# Patient Record
Sex: Male | Born: 1970 | Race: Black or African American | Hispanic: No | Marital: Single | Smoking: Never smoker
Health system: Southern US, Community
[De-identification: ages and names within clinical notes are randomized; demographics above are authoritative.]

## PROBLEM LIST (undated history)

## (undated) DIAGNOSIS — F329 Major depressive disorder, single episode, unspecified: Secondary | ICD-10-CM

## (undated) DIAGNOSIS — K859 Acute pancreatitis without necrosis or infection, unspecified: Secondary | ICD-10-CM

## (undated) DIAGNOSIS — D649 Anemia, unspecified: Secondary | ICD-10-CM

## (undated) DIAGNOSIS — M1990 Unspecified osteoarthritis, unspecified site: Secondary | ICD-10-CM

## (undated) DIAGNOSIS — I129 Hypertensive chronic kidney disease with stage 1 through stage 4 chronic kidney disease, or unspecified chronic kidney disease: Secondary | ICD-10-CM

## (undated) DIAGNOSIS — L02419 Cutaneous abscess of limb, unspecified: Secondary | ICD-10-CM

## (undated) DIAGNOSIS — E785 Hyperlipidemia, unspecified: Secondary | ICD-10-CM

## (undated) DIAGNOSIS — R0602 Shortness of breath: Secondary | ICD-10-CM

## (undated) DIAGNOSIS — F32A Depression, unspecified: Secondary | ICD-10-CM

## (undated) DIAGNOSIS — J189 Pneumonia, unspecified organism: Secondary | ICD-10-CM

## (undated) DIAGNOSIS — L0291 Cutaneous abscess, unspecified: Secondary | ICD-10-CM

## (undated) DIAGNOSIS — I2699 Other pulmonary embolism without acute cor pulmonale: Secondary | ICD-10-CM

## (undated) DIAGNOSIS — I10 Essential (primary) hypertension: Secondary | ICD-10-CM

## (undated) DIAGNOSIS — L309 Dermatitis, unspecified: Secondary | ICD-10-CM

## (undated) DIAGNOSIS — N189 Chronic kidney disease, unspecified: Secondary | ICD-10-CM

## (undated) DIAGNOSIS — I82409 Acute embolism and thrombosis of unspecified deep veins of unspecified lower extremity: Secondary | ICD-10-CM

## (undated) HISTORY — DX: Essential (primary) hypertension: I10

## (undated) HISTORY — DX: Cutaneous abscess, unspecified: L02.91

## (undated) HISTORY — PX: OTHER SURGICAL HISTORY: SHX169

## (undated) HISTORY — DX: Morbid (severe) obesity due to excess calories: E66.01

## (undated) HISTORY — DX: Acute pancreatitis without necrosis or infection, unspecified: K85.90

## (undated) HISTORY — DX: Other pulmonary embolism without acute cor pulmonale: I26.99

## (undated) HISTORY — DX: Hyperlipidemia, unspecified: E78.5

## (undated) HISTORY — DX: Major depressive disorder, single episode, unspecified: F32.9

## (undated) HISTORY — DX: Anemia, unspecified: D64.9

## (undated) HISTORY — DX: Depression, unspecified: F32.A

## (undated) HISTORY — DX: Unspecified osteoarthritis, unspecified site: M19.90

## (undated) HISTORY — DX: Acute embolism and thrombosis of unspecified deep veins of unspecified lower extremity: I82.409

## (undated) HISTORY — DX: Dermatitis, unspecified: L30.9

---
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
## 1997-08-12 ENCOUNTER — Emergency Department (HOSPITAL_COMMUNITY): Admission: EM | Admit: 1997-08-12 | Discharge: 1997-08-12 | Payer: Self-pay | Admitting: Emergency Medicine

## 1997-11-25 ENCOUNTER — Emergency Department (HOSPITAL_COMMUNITY): Admission: EM | Admit: 1997-11-25 | Discharge: 1997-11-25 | Payer: Self-pay | Admitting: Internal Medicine

## 1997-12-07 ENCOUNTER — Emergency Department (HOSPITAL_COMMUNITY): Admission: EM | Admit: 1997-12-07 | Discharge: 1997-12-07 | Payer: Self-pay | Admitting: Emergency Medicine

## 1997-12-15 ENCOUNTER — Encounter: Payer: Self-pay | Admitting: Emergency Medicine

## 1997-12-15 ENCOUNTER — Emergency Department (HOSPITAL_COMMUNITY): Admission: EM | Admit: 1997-12-15 | Discharge: 1997-12-16 | Payer: Self-pay | Admitting: Emergency Medicine

## 1999-02-24 ENCOUNTER — Emergency Department (HOSPITAL_COMMUNITY): Admission: EM | Admit: 1999-02-24 | Discharge: 1999-02-24 | Payer: Self-pay | Admitting: Emergency Medicine

## 1999-12-09 ENCOUNTER — Emergency Department (HOSPITAL_COMMUNITY): Admission: EM | Admit: 1999-12-09 | Discharge: 1999-12-09 | Payer: Self-pay | Admitting: Emergency Medicine

## 1999-12-21 ENCOUNTER — Encounter: Admission: RE | Admit: 1999-12-21 | Discharge: 2000-03-20 | Payer: Self-pay | Admitting: Internal Medicine

## 1999-12-22 ENCOUNTER — Emergency Department (HOSPITAL_COMMUNITY): Admission: EM | Admit: 1999-12-22 | Discharge: 1999-12-23 | Payer: Self-pay | Admitting: Emergency Medicine

## 1999-12-28 ENCOUNTER — Encounter: Payer: Self-pay | Admitting: Gastroenterology

## 1999-12-28 ENCOUNTER — Ambulatory Visit (HOSPITAL_COMMUNITY): Admission: RE | Admit: 1999-12-28 | Discharge: 1999-12-28 | Payer: Self-pay | Admitting: Gastroenterology

## 2000-07-20 ENCOUNTER — Emergency Department (HOSPITAL_COMMUNITY): Admission: EM | Admit: 2000-07-20 | Discharge: 2000-07-20 | Payer: Self-pay

## 2001-07-09 ENCOUNTER — Encounter: Payer: Self-pay | Admitting: Emergency Medicine

## 2001-07-09 ENCOUNTER — Emergency Department (HOSPITAL_COMMUNITY): Admission: EM | Admit: 2001-07-09 | Discharge: 2001-07-09 | Payer: Self-pay | Admitting: Emergency Medicine

## 2001-08-08 ENCOUNTER — Ambulatory Visit (HOSPITAL_COMMUNITY): Admission: RE | Admit: 2001-08-08 | Discharge: 2001-08-08 | Payer: Self-pay | Admitting: Internal Medicine

## 2001-09-26 ENCOUNTER — Inpatient Hospital Stay (HOSPITAL_COMMUNITY): Admission: AD | Admit: 2001-09-26 | Discharge: 2001-09-29 | Payer: Self-pay | Admitting: Internal Medicine

## 2001-09-26 ENCOUNTER — Encounter: Payer: Self-pay | Admitting: Internal Medicine

## 2001-09-28 ENCOUNTER — Encounter: Payer: Self-pay | Admitting: Gastroenterology

## 2002-04-11 ENCOUNTER — Inpatient Hospital Stay (HOSPITAL_COMMUNITY): Admission: AD | Admit: 2002-04-11 | Discharge: 2002-04-16 | Payer: Self-pay | Admitting: Internal Medicine

## 2004-05-28 ENCOUNTER — Emergency Department (HOSPITAL_COMMUNITY): Admission: EM | Admit: 2004-05-28 | Discharge: 2004-05-28 | Payer: Self-pay | Admitting: Emergency Medicine

## 2006-05-02 ENCOUNTER — Emergency Department (HOSPITAL_COMMUNITY): Admission: EM | Admit: 2006-05-02 | Discharge: 2006-05-02 | Payer: Self-pay | Admitting: Emergency Medicine

## 2006-05-06 ENCOUNTER — Inpatient Hospital Stay (HOSPITAL_COMMUNITY): Admission: AD | Admit: 2006-05-06 | Discharge: 2006-05-12 | Payer: Self-pay | Admitting: *Deleted

## 2006-05-06 ENCOUNTER — Encounter: Admission: RE | Admit: 2006-05-06 | Discharge: 2006-05-06 | Payer: Self-pay | Admitting: *Deleted

## 2007-08-05 ENCOUNTER — Emergency Department (HOSPITAL_COMMUNITY): Admission: EM | Admit: 2007-08-05 | Discharge: 2007-08-05 | Payer: Self-pay | Admitting: Emergency Medicine

## 2008-01-02 ENCOUNTER — Emergency Department (HOSPITAL_COMMUNITY): Admission: EM | Admit: 2008-01-02 | Discharge: 2008-01-02 | Payer: Self-pay | Admitting: Emergency Medicine

## 2008-03-16 ENCOUNTER — Emergency Department (HOSPITAL_COMMUNITY): Admission: EM | Admit: 2008-03-16 | Discharge: 2008-03-16 | Payer: Self-pay | Admitting: Emergency Medicine

## 2009-12-26 ENCOUNTER — Emergency Department (HOSPITAL_COMMUNITY)
Admission: EM | Admit: 2009-12-26 | Discharge: 2009-12-27 | Payer: Self-pay | Source: Home / Self Care | Admitting: Emergency Medicine

## 2010-04-07 LAB — URINALYSIS, ROUTINE W REFLEX MICROSCOPIC
Bilirubin Urine: NEGATIVE
Glucose, UA: NEGATIVE mg/dL
Ketones, ur: NEGATIVE mg/dL
Leukocytes, UA: NEGATIVE
Protein, ur: >300 mg/dL — AB
pH: 5.5 (ref 5.0–8.0)

## 2010-04-07 LAB — CBC
Hemoglobin: 10.1 g/dL — ABNORMAL LOW (ref 13.0–17.0)
MCHC: 32.7 g/dL (ref 30.0–36.0)
Platelets: 318 10*3/uL (ref 150–400)
RBC: 4.18 MIL/uL — ABNORMAL LOW (ref 4.22–5.81)

## 2010-04-07 LAB — DIFFERENTIAL
Basophils Absolute: 0.1 10*3/uL (ref 0.0–0.1)
Basophils Relative: 1 % (ref 0–1)
Eosinophils Absolute: 0.6 10*3/uL (ref 0.0–0.7)
Lymphocytes Relative: 44 % (ref 12–46)
Monocytes Absolute: 0.7 10*3/uL (ref 0.1–1.0)
Neutrophils Relative %: 35 % — ABNORMAL LOW (ref 43–77)

## 2010-04-07 LAB — POCT I-STAT, CHEM 8
BUN: 44 mg/dL — ABNORMAL HIGH (ref 6–23)
Calcium, Ion: 1.05 mmol/L — ABNORMAL LOW (ref 1.12–1.32)
Calcium, Ion: 1.1 mmol/L — ABNORMAL LOW (ref 1.12–1.32)
Chloride: 116 mEq/L — ABNORMAL HIGH (ref 96–112)
Creatinine, Ser: 3.6 mg/dL — ABNORMAL HIGH (ref 0.4–1.5)
Glucose, Bld: 168 mg/dL — ABNORMAL HIGH (ref 70–99)
HCT: 29 % — ABNORMAL LOW (ref 39.0–52.0)
HCT: 33 % — ABNORMAL LOW (ref 39.0–52.0)
Hemoglobin: 11.2 g/dL — ABNORMAL LOW (ref 13.0–17.0)
Potassium: 4.2 mEq/L (ref 3.5–5.1)
Sodium: 141 mEq/L (ref 135–145)
TCO2: 19 mmol/L (ref 0–100)

## 2010-04-07 LAB — GLUCOSE, CAPILLARY: Glucose-Capillary: 166 mg/dL — ABNORMAL HIGH (ref 70–99)

## 2010-04-07 LAB — URINE MICROSCOPIC-ADD ON

## 2010-06-12 NOTE — H&P (Signed)
NAME:  Steven Logan, Steven Logan                       ACCOUNT NO.:  [id number]   MEDICAL RECORD NO.:  [id number]                   PATIENT TYPE:  INP   LOCATION:  5731                                 FACILITY:  [facility]   PHYSICIAN:  Ravi R. Avva, M.D.                  DATE OF BIRTH:  03/23/1970   DATE OF ADMISSION:  09/26/2001  DATE OF DISCHARGE:                                HISTORY & PHYSICAL   CHIEF COMPLAINT:  No bowel movement for approximately one week, nausea and  vomiting x one day, associated with increasing malaise and  epigastric/periumbilical pain.   HISTORY OF PRESENT ILLNESS:  The patient is a 41 year old African-American  male who has multiple medical problems, including uncontrolled type 2  diabetes mellitus, dependent on large amounts of insulin and insulin  sensitizers along with morbid  obesity and a history of  hypertriglyceridemia, complicated by pancreatitis in December 2001. He has  also had increasing symptoms consistent with gastroesophageal reflux disease  during the last one year for  which he has been started on a proton pump  inhibitor. Other issues include eczema  and seasonal allergic rhinitis and a  family history significant for orderly cerebrovascular disease. The patient  has been somewhat noncompliant with timely followup with regards to his type  2 diabetes,  by his report, has been fairly diligent with trying to reduce  his caloric intake and eating a low fat diet. Despite these interventions  and a high dose of insulin consisting of approximately 1 unit/kg of insulin,  the patient consistently has had hemoglobin A1C of approximately 9/10%. The  patient states he checks his capillary blood glucose measurements  approximately twice a day prior to  meals, and they have ranged from the low  100s to the low 200s. Post prandial blood sugars have not been measured.   The patient presented to our office as an acute work in with a one  week  history of not  having  a bowel movement, now associated with a one day  history of nausea and vomiting, unable to keep any fluids down, associated  with increasing malaise and abdominal pain. The patient denies any fever or  chills or any chest pain or shortness of breath. He denies any change in his  medications. He also denies any issues of muscle spasms,change in urine  output, however, does report increasing polydipsia. The patient states that  he has been compliant with all of his medications along with a low calorie  diet during the last  several weeks. He did miss his last office visit for  reasons that are unclear.   PROBLEM LIST:  1. Type 2 diabetes, uncontrolled.  2. Eczema.  3. Family history of early cerebrovascular accident.  4. Morbid obesity.  5. Hypertriglyceridemia.  6. History of pancreatitis in December of 2001.  7. Gastroesophageal reflux disease by symptoms.  8. Seasonal allergic rhinitis  with  normal PFTs.   SOCIAL HISTORY:  The patient is single, has a high school education, works  as a Training and development officer at [employer] and has a history of using minimal alcohol and  no tobacco abuse.   FAMILY HISTORY:  Significant for a father having  died at the age of 5 of  murder, a mother having  died at the age of 73 of  type 2 diabetes, stroke  and hypertension. The patient does have grandparents who have had colon  cancer. The patient also has a brother who is approximately 33 years old who  has hypertension.   ALLERGIES:  No known drug allergies.   CURRENT MEDICATIONS:  1. Humalog insulin mixed 75/25 110 units b.i.d.  2. Nexium 40 mg b.i.d.  3. Altace 10 mg p.o. q.d.  4. TriCor 160 mg p.o. q.d.  5. Zocor 40 mg p.o. q.d.  6. Actos 45 mg p.o. q.d.  7. Norvasc 5 mg p.o. q.d.  8. Allegra 180 mg p.o. q.d.  9. Albuterol inhaler p.r.n.   PHYSICAL EXAMINATION:  GENERAL:  We have a morbidly obese African-American  male in mild malaise but talking in full sentences, responsive and  answering  questions appropriately. Oxygen saturations 96% on room air.  VITAL SIGNS:  Blood pressure 136/100, pulse 112 and regular, respirations 22  and nonlabored. The patient is afebrile.  HEENT:  Head exam normocephalic, atraumatic. Eye exam sclerae anicteric.  Extraocular movements intact. ENT exam, there are no oropharyngeal lesions,  there is  no sinus tenderness.  NECK:  Supple, no thyromegaly or cervical lymphadenopathy. Carotid arteries  are strong bilaterally without any evidence of bruits.  LUNGS:  Clear to auscultation bilaterally.  CARDIOVASCULAR:  There is a tachycardic rate that is regular.  ABDOMEN: Exam reveals a soft abdomen with hypoactive bowel sounds. No  hepatosplenomegaly was appreciated. The patient was  diffusely tender  especially in the periumbilical area and the epigastric area, but there is  no rebound present.  RECTAL:  Exam was refused by the patient despite multiple urgings for the  same.  EXTREMITIES:  Reveals no edema with pulses intact, no evidence of cyanosis.  NEUROLOGIC:  Grossly nonfocal.   ASSESSMENT AND PLAN:  We have a 40 year old African-American male who  clearly has the metabolic syndrome of a high degree of insulin resistance,  hypertension, hypertriglyceridemia, family history of early vascular  disease, and a personal history of pancreatitis, probably secondary to his  hypertriglyceridemia. His diabetes is  poorly controlled and he now presents  with significant abdominal pain in conjunction with nausea and vomiting and  hypoactive bowel sounds, worrisome for recurrent pancreatitis.   Our plan will be to check a KUB to rule out ileus and free air, keep the  patient n.p.o. and  provide IV fluids. Will check comprehensive metabolic  profile, amylase, lipase, urinalysis and CBC and assess for Ranson's  criteria for pancreatitis. The patient will probably need abdominal ultrasound and/or CT scan as well, pending the above results.  With  regards  to the patient's constipation, will provide enemas x 2, but  this is  probably secondary to poor bowel motility in the setting of the  pancreatitis along with possible dehydration in the setting of uncontrolled  type 2 diabetes mellitus and increased urine output.  Will provide a bowel  regimen based on the above results. With respect to the patient's type 2  diabetes mellitus will start NPH twice daily and add a sliding scale insulin  regimen  and treat aggressively while during hospitalization. Hospitalization  will also give [place] some idea of whether the patient has  been compliant with his high insulin regimen despite uncontrolled blood  glucose measurements and hemoglobin A1C. Hemoglobin A1C during our office  visit today was 14.3%. For the patient's gastroesophageal reflux disease,  will provide intravenous  proton pump  inhibitor an switch to orals once  able.                                                Ravi R. Dagmar Hait, M.D.    RRA/MEDQ  D:  09/26/2001  T:  09/26/2001  Job:  [id number]   cc:   Juanita Craver, M.D.  [facility] [address] [place]  [place] [address]  Fax: [phone]

## 2010-06-12 NOTE — Discharge Summary (Signed)
NAME:  Steven Logan, Steven Logan                       ACCOUNT NO.:  [id number]   MEDICAL RECORD NO.:  [id number]                   PATIENT TYPE:  INP   LOCATION:  5731                                 FACILITY:  [facility]   PHYSICIAN:  Ravi R. Dagmar Hait, M.D.                  DATE OF BIRTH:  [birth year]   DATE OF ADMISSION:  09/26/2001  DATE OF DISCHARGE:  09/29/2001                                 DISCHARGE SUMMARY   DISCHARGE DIAGNOSES:  1. Abdominal pain associated with nausea and vomiting consistent with mild     pancreatitis in the setting of hypertriglyceridemia.  2. Uncontrolled type 2 diabetes mellitus requiring high doses of insulin.  3. Metabolic syndrome in the setting of type 2 diabetes mellitus, morbid     obesity, hypertriglyceridemia, hypertension.  4. Hypertriglyceridemia.  5. Gastroesophageal reflux disease based on symptomatology.  6. Hypertension.  7. Morbid obesity.   DISCHARGE MEDICATIONS:  1. NPH insulin 100 units q.a.m. and q.p.m. with meals.  2. Nexium 40 mg q.d.  3. Altace 10 mg p.o. q.d.  4. TriCor 160 mg p.o. q.d.  5. Zocor 40 mg p.o. q.d.  6. Actos 45 mg p.o. q.d.  7. Norvasc 5 mg p.o. q.d.  8. Novolog insulin sliding scale prior to meals and q.2h. as long as glucose     is greater than 200.  Sliding scales as follows:  60-100= 0 units, 101-     150= 3 units, 151-200= 4 units, 201-250= 8 units, 251-300= 12 units, 301-     350= 16 units, greater than 350= 20 units.   DIET:  No concentrated sweets, no high fat foods.   DISCHARGE INSTRUCTIONS:  1. Record all insulin used and blood sugars on paper.  2. Bring all papers to Dr. Danna Hefty office.  3. Call Manuela Schwartz, Dr. Danna Hefty nurse, for an appointment in two to three weeks.  4. Call with any nausea, vomiting, or abdominal pain.  5. Follow up with nutrition and diabetes management center for dietary     consultation and evaluation of current eating habits.   HISTORY OF PRESENT ILLNESS:  The patient is a 40 year old  African-American  male whose past medical history and current problems are well documented in  the patient's history and physical dictated on the day of admission.  Briefly, this is a 40 year old African-American male who has significant  metabolic syndrome as mentioned above.  Compliance has been a factor,  however, per patient report he has been taking all of his medications on a  regular basis, however, dietary noncompliance continues to be an issue.  The  patient's hemoglobin A1C consistently runs 9 to 10%.   On the day of admission, the patient presented with a one week history of  progressive nausea, vomiting, and abdominal pain, in conjunction with  constipation.  The patient does have a history of pancreatitis thought to be  secondary to hypertriglyceridemia.  The  patient was admitted for further  evaluation and management of a presumed pancreatitis.   HOSPITAL COURSE:  The patient was seen and evaluated by Dr. Juanita Craver of  gastroenterology.  The patient was continued on intravenous fluids,  intravenous proton pump inhibitor.  KUB was obtained to rule out ileus and  perforation with free air.  This was unremarkable.  Amylase and lipase as  documented below revealed a mildly elevated lipase with normal liver  function tests.  White blood cell count was slightly elevated.  Within 24  hours the patient felt mildly better, and he was started on clear fluids  which he tolerated.  Abdominal ultrasound was reportedly unremarkable per  Dr. Collene Mares.  After evaluation of the patient's past medical records and  physical examination, she agreed with intravenous Protonix, ultrasound, and  aggressive treatment of the patient's hyperlipidemia.  She did not think  that the patient needed an esophagogastroduodenoscopy at this point,  however, may need a HIDA scan before an esophagogastroduodenoscopy if the  abdominal pain persists.  She further agreed with aggressive dietary  management in  addition to getting his blood sugars under control.   With respect to the patient's type 2 diabetes, the patient was n.p.o. with  the exception of clear fluids.  On a regimen of NPH 100 units b.i.d., and  sliding scale insulin using the insulin resistance scale, the patient's  blood sugars ranged from 122 to 229.  The patient was seen by nutrition who  also discussed with the patient his dietary habits.  He stated that he knows  his diabetes and fat in his diet needs to be under better control, and he  basically knows what to do, and he was ready to make changes.  He did  request written information from the dietitian which was readily provided.  Dietitian further suggested to concentrate on his diabetic control first and  then manage his lipids if they are still uncontrolled.  She further  recommended followup at the nutrition and diabetes management center.  Brief  mention was mentioned to the patient the possibility of a gastric bypass  operation given his morbid obesity and significant metabolic syndrome.  This  will be followed up on an outpatient basis.  Again, strict control of the  patient's diet along with close monitoring of his blood sugars was enforced  with the patient, and he will follow up with myself in two to three weeks.   With respect to the patient's gastroesophageal reflux disease and  hypertension, these were well controlled on his current regimen as mentioned  above.   LABORATORY DATA:  Laboratories for day of discharge are still pending.  However, prior to discharge the patient's sodium was 137, potassium 3.5,  chloride 105, carbon dioxide 22, glucose 177, BUN 11, creatinine 0.9,  calcium 8.6, total protein 6.6, albumin 2.7, AST 11, ALT 18, alkaline  phosphatase 75, total bilirubin 0.7.  White blood cell count 6.8, hemoglobin  14.0, hematocrit 35.5%, MCV 82, platelet count 230.  TSH has been normal on an outpatient basis.  Urinalysis is unremarkable.  Total  cholesterol was  672, triglycerides were 3011, HDL was 23.     Ravi R. Dagmar Hait, M.D.                        Ravi R. Dagmar Hait, M.D.    RRA/MEDQ  D:  09/29/2001  T:  10/01/2001  Job:  [id number]   cc:   Juanita Craver,  M.D.  [address] [place]  [place] [address]  Fax: [phone]

## 2010-06-12 NOTE — H&P (Signed)
NAME:  Steven Logan, Steven Logan NO.:  [id number]   MEDICAL RECORD NO.:  [id number]          PATIENT TYPE:  INP   LOCATION:  3705                         FACILITY:  [facility]   PHYSICIAN:  Kaylyn Lim., M.D.DATE OF BIRTH:  1971-01-08   DATE OF ADMISSION:  05/06/2006  DATE OF DISCHARGE:                              HISTORY & PHYSICAL   INDICATION FOR ADMISSION:  Pulmonary embolus.   PRIMARY CARE PHYSICIAN:  Dr. Berneta Sages, [facility].   HISTORY OF PRESENT ILLNESS:  Steven Logan is a very pleasant 40 year old  African American male, past medical history of diabetes mellitus,  dyslipidemia, gastroesophageal reflux disease, obesity who presented for  evaluation of shortness of breath.  The patient has had shortness of  breath now for the last 5 days.  He actually had an ER evaluation on  April7, 2008, after a syncopal spell.  At that time, he reported he was  having dyspnea both at rest and with exertion.  He states that he was  leaning over and he passed out.  This happened twice.  He came in for  evaluation, had routine blood work checked which all was normal.  His O2  sats were normal.  His EKG showed no significant ST or T-wave changes.  He was told to follow up with his primary doctor.  Because his symptoms  continued, he then came for a new patient consult.  He was seen  yesterday for evaluation of his shortness of breath.  At that time he  did state he passed out twice, both on Monday.  When he did lose  consciousness, he said he urinated on himself.  It feels like he just  could get his breath.  He denies any palpitations.  No presyncope at  current time.  He had a chest x-ray done which checked out okay.  At  his initial evaluation yesterday, he was found to be volume overloaded,  started on Lasix, also had an echocardiogram which showed marked RV  enlargement.  He then was sent for a D-dimer which was elevated and a CT  scan which showed bilateral  pulmonary emboli.  He will now be admitted  for anticoagulation and treatment.   REVIEW OF SYSTEMS:  Are as per HPI.   CURRENT MEDICATIONS:  1. Lantus 75 units twice daily.  2. Benicar 20 mg daily.  3. Lasix 20 mg daily (just started).   ALLERGIES:  NONE.   FAMILY HISTORY:  Positive for stroke, hypertension and diabetes.  No  early heart disease or sudden cardiac death noted.   SOCIAL HISTORY:  He is single.  He works as a Training and development officer.  He does not  exercise.  No tobacco.  Drinks one to two beers per day.  No significant  caffeine intake.   PAST SURGICAL HISTORY:  None.   PHYSICAL EXAMINATION:  VITAL SIGNS:  His weight is 278 pounds.  His  blood pressure is 130/90.  His heart rate is 102 and regular.  GENERAL:  He is a very pleasant, young, African American male, alert and  oriented x4.  No acute distress.  NECK:  He does have mild JVD.  LUNGS:  Clear.  HEART:  Regular with S4 noted.  ABDOMEN:  Soft, nontender and nontender, nondistended.  No rebound or  guarding.  EXTREMITIES:  Warm with trace pedal and pretibial edema.  NEUROLOGIC:  Cranial nerves are intact with no focal deficits.   His echo was reviewed and showed normal LV size with mild LV dysfunction  and EF of 40-45%.  He had marked LVH with LV thickness of 19-mm.  He had  marked RV enlargement with mildly reduced function and moderate  pulmonary hypertension with PA systolic pressure estimated at 54-mmHg.  He had mild diastolic dysfunction and mild tricuspid valve  regurgitation.  His most recent blood work showed a BUN and creatinine  of 13 and 0.9, potassium level of 3.6.  He had a D-dimer of 4.7, and a  BNP level of 115.  His EKG showed sinus tachycardia, rate of 102 per  minute, normal axis, normal intervals with no significant ST or T-wave  changes noted.  The patient was sent for a CT which showed bilateral  pulmonary emboli with a saddle embolus also noted.   IMPRESSION:  1. Pulmonary embolism.  2. Dyspnea  secondary to number one.  3. Diabetes mellitus.  4. History of hypertension.  5. History of mild left ventricular dysfunction.  6. Morbid obesity.   PLAN:  1. At the current time the patient will be admitted to the hospital      for treatment of his pulmonary embolism.  2. We will start heparin drip.  He will continue his heparin for 24      hours prior to starting his Coumadin.  3. We will also check a hypercoagulable workup because of his      disorder.  4. I discussed the results of his CT and echo with him at length.      While he is in the hospital, he will also check for infiltrative      heart disease with TSH, ferritin levels, SPEP, UPEP, etc.      Kaylyn Lim., M.D.  Electronically Signed     TWK/MEDQ  D:  05/06/2006  T:  05/06/2006  Job:  [id number]   cc:   Berneta Sages, M.D.

## 2010-06-12 NOTE — H&P (Signed)
NAME:  Steven Logan, Steven Logan                       ACCOUNT NO.:  [id number]   MEDICAL RECORD NO.:  [id number]                   PATIENT TYPE:  INP   LOCATION:  5031                                 FACILITY:  [facility]   PHYSICIAN:  Berneta Sages, M.D.                     DATE OF BIRTH:  16-Feb-1970   DATE OF ADMISSION:  04/11/2002  DATE OF DISCHARGE:                                HISTORY & PHYSICAL   CHIEF COMPLAINT:  Hypertriglyceridemia, severe with risk of pancreatitis and  hyperglycemia, uncontrolled despite excessive amounts of insulin use.   HISTORY OF PRESENT ILLNESS:  This is a 40 year old African American male who  is morbidly obese, complicated by stressors at work. He has a history of  type 2 diabetes mellitus uncontrolled, using large amounts of insulin,  hypertriglyceridemia with life-threatening levels secondary to risk of  pancreatitis, hypertension, questionable asthma with normal pulmonary  function tests. He has a history of taking Tricor and Zocor therapy for his  hyperlipidemia for which he and family members state that he has been  compliant with. He also  maintains high-dose insulin consisting of 100 units  of NPH twice daily along with a sliding scale regimen using NovoLog. Despite  these interventions, yet complicated by noncompliance with dietary intake  given stressors, his hemoglobin A1c continues to be  high, usually residing  greater than 10%. His triglyceride levels have been as low as 500, but  recently have been as high as 11,000, and indeed he has been hospitalized  for pancreatitis in the past. The patient again states that his compliance  with his insulin and anti-hyperlipidemic therapy has been good. Again  dietary control may have been poor secondary to stressors. The patient was  initiated on Lexapro in February 2004. This was in the hope of alleviating  his stressors to allow better control of his type 2 diabetes mellitus and  hypertriglyceridemia.   Given the fact that his triglycerides have been as high as 11,000 and his  hemoglobin A1c has been unmeasurable secondary to his hypertriglyceridemia,  extensive discussion was undertaken between myself, the patient and other  consulting physicians. Given his high risk of pancreatitis, glucose toxicity  and hypertriglyceridemia, it was thought it would be best to admit the  patient for assessment of his pancreatic enzymes, control of his type 2  diabetes mellitus by using a Glucomander at first with a clear fluid diet  with the hopes of transitioning the patient to a normal diet with proper  glucose control prior to discharge.  Furthermore it was thought that by  reducing the patient's glucose toxicity baseline lipid panel measurements  may be obtained with the hope of obtaining proper control using medications  and dietary therapy. Given the patient's significant hypertriglyceridemia,  it was also thought that the patient may benefit from gastric bypass  surgery, given his morbid obesity and his life-threatening levels of  glucose  and triglycerides.   REVIEW OF SYSTEMS:  Negative for fevers, chills, visual abnormalities,  dysphagia, chest pain, shortness of breath, nausea, vomiting, change in  bowel habits or focal neurological deficits. Positive for diffuse weakness  and possible numbness in the hands.   PROBLEM LIST:  1. Type 2 diabetes mellitus, uncontrolled for numerous years.  2. Eczema.  3. Family history of early cerebrovascular accident.  4. Morbid obesity.  5. Hypertriglyceridemia with levels as high as 11,000.  6. History of pancreatitis, treated with bowel rest and intravenous fluid     resuscitation.  7. Gastroesophageal reflux disease.  8. Seasonal allergic rhinitis.   SOCIAL HISTORY:  The patient is single. He is a Training and development officer at [employer]. He  has a  high school education. He has minimal use of alcohol and has no use  of tobacco products.   FAMILY HISTORY:   Significant for  his mother having  died at the age of 8  of type 2 diabetes mellitus, stroke and hypertension. Father having  died at  the age of 27 of murder. Grandparents, history is significant for colon  cancer. A 55 year old brother is significant for having  hypertension.   CURRENT MEDICATIONS:  1. Nexium 40 mg b.i.d.  2. Tricor 160 mg p.o. every day.  3. Zocor 40 mg p.o. every day.  4. Actos 45 mg p.o. every day.  5. Benicar 40/25 mg 1 p.o. every day.  6. Albuterol metered-dose inhaler p.r.n.  7. Advair 50/100 1 puff b.i.d.  8. NPH insulin 100 units b.i.d.  9. NovoLog sliding scale regimen.  10.      Vicodin p.r.n.  11.      Senokot p.r.n.   PHYSICAL EXAMINATION:  GENERAL:  He is a pleasant, morbidly obese African  American male in no apparent distress lying in bed. Weight is 320 pounds.  VITAL SIGNS: Blood pressure 130/49, pulse 97, respirations 20 and unlabored,  temperature 97.2 degrees  Fahrenheit.  HEENT:  Sclerae anicteric. Extraocular movements intact. No oropharyngeal  lesions. No sinus tenderness.  NECK:  Supple. No thyromegaly, no carotid bruits. No cervical or axillary  lymphadenopathy.  LUNGS:  Clear to auscultation bilaterally.  CARDIOVASCULAR:  Regular rate and rhythm but distant heart sounds.  ABDOMEN:  Soft, nontender, nondistended abdomen with bowel sounds present  throughout. There is no hepatosplenomegaly.  EXTREMITIES:  No edema. Pulses are 2+ all 4 extremities.  NEUROLOGIC:  Grossly nonfocal including to light touch.   LABORATORY DATA:  Sodium 129, potassium 4.1, chloride 94, serum CO2 26,  glucose 495, creatinine 1.1, BUN 17. Alkaline phosphatase 76, total  bilirubin 0.7, SGOT 17, SGPT 23, albumin 3.6, calcium 9.8, amylase 60,  lipase 22. CBC, TSH and free T4 pending. Labs for the morning that are  pending include lipid panel, hemoglobin A1c, and C-peptide.   ASSESSMENT AND PLAN: 1. Uncontrolled type 2 diabetes mellitus, currently on Actos  therapy along     with high-dose insulin therapy in excess of 1 unit per kg. Will initiate     an insulin drip with Glucomander and have the patient start a clear fluid     diet and advance once CBGs are controlled with the idea of obtaining     optimal control of his diabetes on a regular no  concentrated sweet diet.     Our  plan will be to decrease his glucose toxicity so that his pancreatic     activity may improve with insulin secretion and adjust his  diabetic     regimen for outpatient use. Will also obtain a C-peptide and check a     hemoglobin A1c since this was not measurable on an outpatient basis.     Other priorities will include compliance. We are working on the patient's     compliance with dietary intake.  2. Hypertriglyceridemia. Life-threatening given issues with risks of     pancreatitis. Again will reduce glucose toxicity, recheck lipid panel,     pancreatic enzymes appear normal, and will check thyroid function tests.     However, these have been normal within the last year on an outpatient     basis. Will recheck lipid  panel once control of type 2 diabetes mellitus     has been achieved and maintain the patient on Tricor and Zocor to see if     control of his triglycerides can be maintained.  3. Morbid obesity. Discussed at length with the patient and family. Given     the patient's severe hypertriglyceridemia and hyperglycemia, one may     consider gastric bypass surgery as an alternative for this patient's     future and in doing so will consult Dr. Johnathan Hausen for     recommendations concerning the same.                                               Berneta Sages, M.D.    RA/MEDQ  D:  04/11/2002  T:  04/11/2002  Job:  [id number]   cc:   Isabel Caprice Hassell Done, M.D.  [id number] [address], [address]  [place] [address]  Fax: [phone]

## 2010-06-12 NOTE — Discharge Summary (Signed)
NAME:  Steven Logan, Steven Logan                       ACCOUNT NO.:  [id number]   MEDICAL RECORD NO.:  [id number]                   PATIENT TYPE:  INP   LOCATION:  [id number]                                 FACILITY:  [facility]   PHYSICIAN:  Berneta Sages, M.D.                     DATE OF BIRTH:  11-Aug-1970   DATE OF ADMISSION:  04/11/2002  DATE OF DISCHARGE:  04/16/2002                                 DISCHARGE SUMMARY   DISCHARGE DIAGNOSES:  1. Severe hypertriglyceridemia with triglyceride levels in excess of 10,000.  2. Severe insulin resistance with uncontrolled type 2 diabetes mellitus.  3. Morbid obesity.  4. Depression.  5. Gastroesophageal reflux disease.  6. History of pancreatitis.  7. Hypertension.  8. Asthma.  9. Seasonal allergic rhinitis.   DISCHARGE MEDICATIONS:  1. Lantus insulin 200 units subcutaneously b.i.d.  Of the 200 units, 100     units are to be administered at one of two different sites.  2. Novolog sliding scale prior to meals: 101 to 150, 14 units; 151 to 200,     22 units; 201 to 250, 28 units; 251 to 300, 36 units; 301 to 350, 45     units; greater than 350, 50 units.  3. Benicar 40 mg each day.  4. Actos 45 mg each day.  5. TriCor 160 mg each day.  6. Lipitor 20 mg each day.  7. Nexium 40 mg each day.  8. Advair 1 puff b.i.d.  9. Lexapro 10 mg each day.   DIET:  No fat, no concentrated sweets.  The patient was instructed to check  his CBGs four times a day.   FOLLOW UP:  He is to follow up with me in two weeks and is to bring his CBG  log.  He is also to follow up with the nutrition [facility] diabetes management  center.   DISCHARGE LABORATORY EVALUATION:  White blood cell count 4.1, hemoglobin  11.0, hematocrit 30%, platelet count 256.  Sodium 129 on admission; at time  of discharge, 140.  Potassium on discharge 3.6, BUN 11 on discharge,  creatinine 0.9.  On admission, BUN was 17.  Glucose on admission was 495;  glucose on discharge was 78.  AST 17, ALT 23,  alkaline phosphatase 76, total  bilirubin 0.7, amylase 60, lipase 22.  Hemoglobin A1C 14.4%.  Total  cholesterol 887, triglycerides 12,420.  Of note, the previous lipid profile  was on 04/12/2002.  Repeat lipid profile within on day after being on a  nuclein, total cholesterol 932, triglycerides down to 8650.  TSH 1.645, free  T4 1.47, C peptide 0.8.   HISTORY OF PRESENT ILLNESS:  This is a 40 year old African-American male who  is morbidly obese, complicated by stressors at work, but with a history of  type 2 diabetes mellitus, insulin resistance, and life-threatening  hypertriglyceridemia, with a history of pancreatitis and hypertension.  He  has failed outpatient therapy using moderate doses of insulin, anti-  lipidemics, antihypertensive agents, and antidepressant type medications.  Please see my History and Physical for extensive details with regard to the  patient's presenting illness and recent history.  The patient was admitted  for intravenous insulin to control his uncontrolled type 2 diabetes mellitus  and with the hope of decreasing his hypertriglyceridemia which were at life-  threatening levels due to risk of pancreatitis.   HOSPITAL COURSE:  The patient was admitted, placed on a Glucommander which  quickly controlled his blood sugars over 24-hour period of time on hospital-  controlled diet. He was quickly transferred to Lantus insulin 120 units  q.6h.; however, his glucose control quickly decompensated, and his Lantus  insulin was quickly escalated with a sliding scale regimen of Novolog that  was escalated as well.  During this process, the patient's visual complaints  of fatigue and dry mouth quickly resolved.  The staff as well as myself and  colleagues had a difficult time getting the patient to understand the nature  of his disease process and the need for multiple site injections for his  Lantus insulin given the large volumes.   The patient was initiated on TriCor  and Lipitor, given his  hypertriglyceridemia, and his high-dose Zocor was discontinued.  As stated  above, his triglyceride levels were extremely high with outpatient followup  planned.   Blood pressure remained stable.  Diabetes coordinator had a lengthy  discussion with the patient and also commented on the patient's need for  behavioral therapy.  Also mentioned was the fact that the patient was not  checking his CBGs on a regular basis at home as ordered by myself.   [name] sugars, diet, and blood pressure were well controlled by the  day of discharge, 04/16/2002, and it was felt the patient was appropriate for  further outpatient management and workup with strict instructions for  followup in my office.                                               Berneta Sages, M.D.    RA/MEDQ  D:  07/17/2002  T:  07/18/2002  Job:  [id number]

## 2010-06-12 NOTE — Discharge Summary (Signed)
NAME:  Steven Logan, Steven Logan NO.:  [id number]   MEDICAL RECORD NO.:  [id number]          PATIENT TYPE:  INP   LOCATION:  3705                         FACILITY:  [facility]   PHYSICIAN:  Kaylyn Lim., M.D.DATE OF BIRTH:  March 22, 1970   DATE OF ADMISSION:  05/06/2006  DATE OF DISCHARGE:  05/12/2006                               DISCHARGE SUMMARY   DISCHARGE DIAGNOSES:  1. Bilateral pulmonary emboli.  2. Diabetes mellitus.  3. Great toe abscess.  4. Hypertension.  5. Dyslipidemia.  6. Obesity.   HISTORY OF PRESENT ILLNESS:  Steven Logan is a 40 year old gentleman who  presented after two syncopal spells and increasing dyspnea.  He was  found to have elevated D-dimer and increased right-sided heart  pressures.  He was sent for CT scan which showed bilateral PEs with near  total occlusive disease on the right and mild occlusive disease on the  left.  He was admitted for anticoagulation with possibility of  thrombolytics if needed.   HOSPITAL COURSE:  [name] hospital course was uncomplicated.  He  was started on heparin drip immediately on arrival to the hospital.  He  continued on heparin for five days, and Coumadin was started after he  had been therapeutic on heparin for 24 hours.  His INR increased slowly.  After five days of total heparinization he was switched to Lovenox shots  125 mg twice daily.  His breathing improved significantly over his time  here in the hospital.  He has now been up and ambulatory with no  significant problems.  His INR on day of discharge is 1.6.  He will be  discharged home with Lovenox bridge until his INR is therapeutic.  He  never became hypotensive nor had any hemodynamic compromise during his  stay.   DISCHARGE MEDICATIONS:  1. Lantus insulin 65 units twice daily.  2. Sliding scale insulin.  3. Keflex 500 mg four times daily for seven days.  4. Bactroban ointment.  5. Benicar 20 mg daily.  6. Coumadin 15 mg daily (10 mg  tablet 1-1/2 tabs daily).  7. Robitussin DM over-the-counter p.r.n. for cough.  8. Aspirin 325 mg daily.  9. Lovenox 120 mg subcu b.i.d. for four days.  The patient was given      his a.m. dose here in the hospital.  He will take his next dose at      home this evening, then two doses on Friday, and his last dose on      Saturday.   He will follow up with [facility] with Oretha Ellis on May 16, 2006 for INR check.  He will follow up with Dr.  Verlon Setting at [facility] in two weeks for post hospital visit.  Did discuss the need for dietary restrictions.  If he continues to feel  back to normal as he increases activities, he may return to his work  activities on May 14, 2006.      Kaylyn Lim., M.D.  Electronically Signed     TWK/MEDQ  D:  05/12/2006  T:  05/12/2006  Job:  [id number]   cc:   Berneta Sages, M.D.  [employer]

## 2010-09-09 ENCOUNTER — Encounter: Payer: Self-pay | Admitting: Internal Medicine

## 2010-09-22 ENCOUNTER — Encounter: Payer: Self-pay | Admitting: Internal Medicine

## 2010-09-22 ENCOUNTER — Ambulatory Visit (INDEPENDENT_AMBULATORY_CARE_PROVIDER_SITE_OTHER): Payer: BC Managed Care – PPO | Admitting: Internal Medicine

## 2010-09-22 VITALS — BP 132/82 | HR 78 | Ht 73.0 in | Wt 278.0 lb

## 2010-09-22 DIAGNOSIS — N189 Chronic kidney disease, unspecified: Secondary | ICD-10-CM | POA: Insufficient documentation

## 2010-09-22 DIAGNOSIS — M109 Gout, unspecified: Secondary | ICD-10-CM | POA: Insufficient documentation

## 2010-09-22 DIAGNOSIS — Z7901 Long term (current) use of anticoagulants: Secondary | ICD-10-CM

## 2010-09-22 DIAGNOSIS — I2699 Other pulmonary embolism without acute cor pulmonale: Secondary | ICD-10-CM | POA: Insufficient documentation

## 2010-09-22 DIAGNOSIS — E785 Hyperlipidemia, unspecified: Secondary | ICD-10-CM | POA: Insufficient documentation

## 2010-09-22 DIAGNOSIS — I10 Essential (primary) hypertension: Secondary | ICD-10-CM | POA: Insufficient documentation

## 2010-09-22 DIAGNOSIS — D509 Iron deficiency anemia, unspecified: Secondary | ICD-10-CM | POA: Insufficient documentation

## 2010-09-22 MED ORDER — PEG-KCL-NACL-NASULF-NA ASC-C 100 G PO SOLR: 1.0000 | ORAL | Status: DC

## 2010-09-22 NOTE — Patient Instructions (Signed)
You have been scheduled for an Endo Colon. Please stop by the lab today in our basement for labs. We are sending a letter regarding holding your warfarin before your procedure if you do not here from [place] in 1 week please call [phone] and ask for Patty. Your prep has been sent to your pharmacy.

## 2010-09-22 NOTE — Progress Notes (Signed)
Subjective:    Patient ID: [name], male    DOB: March 17, 1970, 40 y.o.   MRN: [id number]  HPI Mrs. Mechele Claude is a 40 year old male with a past medical history of hypertension, diabetes, PE on chronic warfarin who is referred by Dr. Dagmar Hait for evaluation of iron deficiency anemia.  The patient present alone today. Patient is aware of his anemia but is unsure how long this has been an issue. He denies obvious blood loss. He seen no blood in his stool and he denies melena. No hematemesis. No hematuria.  Patient denies abdominal pain. He denies nausea/vomiting, the 1 day last week he did have nausea and vomiting but this resolved. He denies heartburn. No odynophagia or dysphagia. He reports his bowel movements are regular and once per day. He reports these as formed and brown.  No fever or chills.  He reports his weight has been stable of late, and reports his appetite is good. He does note intense thirst for the past 2 weeks.  He does report having previously had a sigmoidoscopy greater than 10 years ago. He is unsure exactly what this was for but remembers it to have been normal.  Review of Systems Constitutional: Negative for fever, chills, night sweats, activity change, appetite change and unexpected weight change HEENT: Negative for sore throat, mouth sores and trouble swallowing. Eyes: Occasional blurry vision. Respiratory: Negative for cough, chest tightness and shortness of breath Cardiovascular: Negative for chest pain, palpitations and lower extremity swelling Gastrointestinal: See history of present illness Genitourinary: Negative for dysuria and hematuria. Musculoskeletal: Negative for back pain, + occasional muscle cramping. Skin: Negative for rash or color change Neurological: Occasional headaches, no weakness, numbness Hematological: Negative for adenopathy, negative for easy bruising/bleeding Psychiatric/behavioral: Negative for depressed mood, negative for anxiety, + difficulty  with sleeping.  PMH: HTN Obesity Gout Eczema History of bilateral pulmonary emboli Hyperlipidemia  Acute renal insufficiency with hospitalization 2011 History pancreatitis felt secondary to hypertriglyceridemia  Current outpatient prescriptions:amLODipine-olmesartan (AZOR) 5-40 MG per tablet, Take 1 tablet by mouth daily.  , Disp: , Rfl: ;  insulin aspart protamine-insulin aspart (NOVOLOG 70/30) (70-30) 100 UNIT/ML injection, Inject into the skin 2 (two) times daily with a meal. 50 units twice a day , Disp: , Rfl: ;  Nebivolol HCl (BYSTOLIC) 20 MG TABS, Take by mouth.  , Disp: , Rfl:  predniSONE (DELTASONE) 20 MG tablet, Take 20 mg by mouth daily. As needed , Disp: , Rfl: ;  warfarin (COUMADIN) 10 MG tablet, Take 10 mg by mouth. Four times a week , Disp: , Rfl: ;  peg 3350 powder (MOVIPREP) 100 G SOLR, Take 1 kit (100 g total) by mouth as directed. See written handout, Disp: 1 kit, Rfl: 0  All: NKDA  Social History  . Marital Status: Single, no kids   Social History Main Topics  . Smoking status: Never Smoker   . Smokeless tobacco: Never Used  . Alcohol Use: None  . Drug Use: No   Family History  Problem Relation Age of Onset  . Stroke Mother   . Diabetes Mother   . Hypertension Mother   . Cancer    . Coronary artery disease    . Hypertension Brother   -notes Mat GM - colon polyps (? Age), Pat GF - lung cancer    Objective:   Physical Exam BP 132/82  Pulse 78  Ht 6\' 1"  (1.854 m)  Wt 278 lb (126.1 kg)  BMI 36.68 kg/m2 Constitutional: Well-developed and well-nourished. No  distress. HEENT: Normocephalic and atraumatic. Oropharynx is clear and moist. No oropharyngeal exudate. Conjunctivae are normal. Pupils are equal round and reactive to light. No scleral icterus. Neck: Neck supple. Trachea midline. Cardiovascular: Normal rate, regular rhythm and intact distal pulses. No M/R/G Pulmonary/chest: Effort normal and breath sounds normal. No wheezing, rales or  rhonchi. Abdominal: Soft, nontender, nondistended. There are no masses palpable. No hepatosplenomegaly. Lymphadenopathy: No cervical adenopathy noted. Neurological: Alert and oriented to person place and time. Skin: Skin is warm and dry. No rashes noted. Psychiatric: Normal mood and affect. Behavior is normal.  Labs 08/31/2010: Iron 20, % sat 8, TIBC 251 WBC 5.5 hemoglobin 8.5 hematocrit 25.7 platelet 328, MCV 75.9 Sodium 141 potassium 5.4 chloride 114 CO2 20 BUN 36 creatinine 2.0 Calcium 8.2 total protein 7.1 albumin 2.9 AST 15 ALT 14 alkaline phosphatase 82 total bili 0.2 Uric acid 11.5    Assessment & Plan:  40 year old male with a past medical history of hypertension, diabetes, PE on chronic warfarin who is referred by Dr. Dagmar Hait for evaluation of unexplained iron deficiency anemia  1. Iron def anemia -- certainly occult GI blood loss is near the top of the differential in this 40 year-old male.  His anemia is significant. Celiac disease is very unlikely given his race however we will check a celiac panel today. I will schedule him for EGD and colonoscopy for further evaluation of iron deficiency anemia. I will also check a TSH today and a ferritin.  He does have chronic kidney disease, but is unclear to me if to the level where one would expect anemia. We will proceed with GI workup for evaluation of his anemia.  We discussed potential capsule endoscopy his upper endoscopy and colonoscopy are negative. He is willing to proceed.  He is on warfarin and therefore we will contact his PCP to discuss the need for Lovenox bridge given his history of pulmonary emboli.  This will be left to the discretion of his PCP.  2. Excessive thirst -- this could be related to his known diabetes, and we will check a chemistry panel today. I've asked that he discuss this with his primary care physician if it does not improve.

## 2010-09-23 ENCOUNTER — Encounter: Payer: Self-pay | Admitting: Internal Medicine

## 2010-09-24 ENCOUNTER — Other Ambulatory Visit (INDEPENDENT_AMBULATORY_CARE_PROVIDER_SITE_OTHER): Payer: BC Managed Care – PPO

## 2010-09-24 DIAGNOSIS — D509 Iron deficiency anemia, unspecified: Secondary | ICD-10-CM

## 2010-09-24 LAB — CBC WITH DIFFERENTIAL/PLATELET
Eosinophils Absolute: 0.2 10*3/uL (ref 0.0–0.7)
Eosinophils Relative: 2.7 % (ref 0.0–5.0)
Lymphocytes Relative: 31.5 % (ref 12.0–46.0)
MCHC: 32.1 g/dL (ref 30.0–36.0)
MCV: 75.9 fl — ABNORMAL LOW (ref 78.0–100.0)
Monocytes Absolute: 0.4 10*3/uL (ref 0.1–1.0)
Neutrophils Relative %: 58.3 % (ref 43.0–77.0)
Platelets: 318 10*3/uL (ref 150.0–400.0)
RBC: 3.33 Mil/uL — ABNORMAL LOW (ref 4.22–5.81)
WBC: 6.4 10*3/uL (ref 4.5–10.5)

## 2010-09-24 LAB — BASIC METABOLIC PANEL
Calcium: 8 mg/dL — ABNORMAL LOW (ref 8.4–10.5)
GFR: 31.42 mL/min — ABNORMAL LOW (ref >60.00)
Potassium: 4.9 mEq/L (ref 3.5–5.1)
Sodium: 143 mEq/L (ref 135–145)

## 2010-09-24 LAB — FERRITIN: Ferritin: 121.5 ng/mL (ref 22.0–322.0)

## 2010-09-24 LAB — TSH: TSH: 3.44 u[IU]/mL (ref 0.35–5.50)

## 2010-09-25 ENCOUNTER — Telehealth: Payer: Self-pay | Admitting: *Deleted

## 2010-09-25 LAB — IGG: IgG (Immunoglobin G), Serum: 1640 mg/dL — ABNORMAL HIGH (ref 650–1600)

## 2010-09-25 NOTE — Telephone Encounter (Signed)
Informed pt that Dr Hilarie Fredrickson stated his lab results continue to show anemia. Anemia may be a mixed problem d/t the Ferritin being normal and IRON and % sat being low. He recommends continuing with ECL on 11/05/10. His Thyroid is normal and the kidney function remains abnormal, but stable. Pt stated understanding.

## 2010-09-25 NOTE — Telephone Encounter (Signed)
Message copied by Lance Morin on Fri Sep 25, 2010 11:08 AM ------      Message from: Jerene Bears      Created: Fri Sep 25, 2010  9:20 AM       Please let the pt know that his results continue to show anemia.      His ferritin is normal, but his iron and % sat were low (previously) which suggests potentially the anemia is from a mixed problem (chronic disease, such as kidney disease).      Rec continue with our plan for egd/colon as discussed at the last clinic visit.      Thyroid also normal.      Kidney function remains abnormal, but is stable for him.

## 2010-10-06 ENCOUNTER — Telehealth: Payer: Self-pay

## 2010-10-06 NOTE — Telephone Encounter (Signed)
Pt aware of the ok for hold on coumadin 5 days.  Response to be scanned to epic

## 2010-10-30 LAB — POCT I-STAT, CHEM 8
Calcium, Ion: 1.15 mmol/L (ref 1.12–1.32)
Creatinine, Ser: 1.5 mg/dL (ref 0.4–1.5)
Glucose, Bld: 132 mg/dL — ABNORMAL HIGH (ref 70–99)
Hemoglobin: 13.6 g/dL (ref 13.0–17.0)
Sodium: 144 mEq/L (ref 135–145)
TCO2: 24 mmol/L (ref 0–100)

## 2010-10-30 LAB — PROTIME-INR
INR: 1 (ref 0.00–1.49)
Prothrombin Time: 13.5 seconds (ref 11.6–15.2)

## 2010-10-30 LAB — CULTURE, BLOOD (ROUTINE X 2): Culture: NO GROWTH

## 2010-10-30 LAB — DIFFERENTIAL
Eosinophils Absolute: 0 10*3/uL (ref 0.0–0.7)
Lymphocytes Relative: 7 % — ABNORMAL LOW (ref 12–46)
Lymphs Abs: 0.9 10*3/uL (ref 0.7–4.0)
Neutro Abs: 11.5 10*3/uL — ABNORMAL HIGH (ref 1.7–7.7)
Neutrophils Relative %: 91 % — ABNORMAL HIGH (ref 43–77)

## 2010-10-30 LAB — GLUCOSE, CAPILLARY

## 2010-10-30 LAB — CBC
Platelets: 333 10*3/uL (ref 150–400)
RBC: 4.86 MIL/uL (ref 4.22–5.81)
WBC: 12.7 10*3/uL — ABNORMAL HIGH (ref 4.0–10.5)

## 2010-11-05 ENCOUNTER — Encounter: Payer: BC Managed Care – PPO | Admitting: Internal Medicine

## 2010-11-23 ENCOUNTER — Encounter: Payer: BC Managed Care – PPO | Admitting: Internal Medicine

## 2010-12-07 ENCOUNTER — Telehealth: Payer: Self-pay | Admitting: Internal Medicine

## 2010-12-07 MED ORDER — PEG-KCL-NACL-NASULF-NA ASC-C 100 G PO SOLR: 1.0000 | ORAL | Status: DC

## 2010-12-07 NOTE — Telephone Encounter (Signed)
Pt will pick up free coupon for moviprep

## 2010-12-07 NOTE — Telephone Encounter (Signed)
Pt had questions about his colon and endo tomorrow regarding the time of his prep.  The instructions have been reviewed again and pt states he understands

## 2010-12-08 ENCOUNTER — Other Ambulatory Visit: Payer: Self-pay | Admitting: Internal Medicine

## 2010-12-08 ENCOUNTER — Ambulatory Visit (AMBULATORY_SURGERY_CENTER): Payer: BC Managed Care – PPO | Admitting: Internal Medicine

## 2010-12-08 ENCOUNTER — Encounter: Payer: Self-pay | Admitting: Internal Medicine

## 2010-12-08 VITALS — BP 199/113 | HR 81 | Temp 98.1°F | Resp 16 | Ht 73.0 in | Wt 278.0 lb

## 2010-12-08 DIAGNOSIS — D649 Anemia, unspecified: Secondary | ICD-10-CM

## 2010-12-08 DIAGNOSIS — D509 Iron deficiency anemia, unspecified: Secondary | ICD-10-CM

## 2010-12-08 DIAGNOSIS — K2960 Other gastritis without bleeding: Secondary | ICD-10-CM

## 2010-12-08 DIAGNOSIS — K2940 Chronic atrophic gastritis without bleeding: Secondary | ICD-10-CM

## 2010-12-08 DIAGNOSIS — Z538 Procedure and treatment not carried out for other reasons: Secondary | ICD-10-CM

## 2010-12-08 LAB — GLUCOSE, CAPILLARY
Glucose-Capillary: 73 mg/dL (ref 70–99)
Glucose-Capillary: 76 mg/dL (ref 70–99)
Glucose-Capillary: 79 mg/dL (ref 70–99)
Glucose-Capillary: 90 mg/dL (ref 70–99)

## 2010-12-08 MED ORDER — PANTOPRAZOLE SODIUM 40 MG PO TBEC: 40.0000 mg | DELAYED_RELEASE_TABLET | Freq: Every day | ORAL | Status: DC

## 2010-12-08 MED ORDER — SODIUM CHLORIDE 0.9 % IV SOLN: 500.0000 mL | INTRAVENOUS | Status: DC

## 2010-12-08 NOTE — Patient Instructions (Signed)
Reschedule colon with 2 day prep and deep sedation.

## 2010-12-09 ENCOUNTER — Telehealth: Payer: Self-pay | Admitting: *Deleted

## 2010-12-09 NOTE — Telephone Encounter (Signed)

## 2010-12-23 ENCOUNTER — Encounter: Payer: Self-pay | Admitting: Internal Medicine

## 2010-12-24 ENCOUNTER — Telehealth: Payer: Self-pay | Admitting: *Deleted

## 2010-12-24 NOTE — Telephone Encounter (Signed)
Dr. Hilarie Fredrickson-- I see that Mr Alires had ok to hold Coumadin 5 days prior to last colonoscopy.  He is scheduled for PV tomorrow- 11/30.  What do you want him to do for upcoming colonoscopy scheduled for 12/12?  Thanks, Ulice Dash

## 2010-12-24 NOTE — Telephone Encounter (Signed)
Hold warfarin 5 days before, I will instruct on when to resume. Thank you.

## 2010-12-31 ENCOUNTER — Encounter: Payer: Self-pay | Admitting: Internal Medicine

## 2011-01-06 ENCOUNTER — Encounter: Payer: BC Managed Care – PPO | Admitting: Internal Medicine

## 2011-01-14 ENCOUNTER — Encounter: Payer: BC Managed Care – PPO | Admitting: Internal Medicine

## 2011-01-29 ENCOUNTER — Ambulatory Visit (AMBULATORY_SURGERY_CENTER): Payer: BC Managed Care – PPO | Admitting: *Deleted

## 2011-01-29 VITALS — Ht 73.0 in | Wt 280.0 lb

## 2011-01-29 DIAGNOSIS — D509 Iron deficiency anemia, unspecified: Secondary | ICD-10-CM

## 2011-01-29 MED ORDER — PEG-KCL-NACL-NASULF-NA ASC-C 100 G PO SOLR: ORAL | Status: DC

## 2011-01-29 NOTE — Progress Notes (Signed)
Explained to patient to check blood sugar levels freq. During prep. And to only take 1/2 insulin dose per instructions. He understands.

## 2011-02-01 ENCOUNTER — Telehealth: Payer: Self-pay | Admitting: *Deleted

## 2011-02-01 NOTE — Telephone Encounter (Signed)
Pt came for PV on Friday 1/4 with [name].  Pt was instructed to be on clear liquids for 2 days before procedure and to take Magnesium Citrate 2 days before procedure as instructed by Dr. Hilarie Fredrickson. (Pt was not cleaned out for colonoscopy 12/08/2010)  Pt admitted to [facility] that he only took 1/2 of prep.  Dr. Hilarie Fredrickson notified. He wants patient to eat breakfast on Monday 1/14 and then be on clear liquids the rest of the day.  Pt should be on clear liquids on Tuesday 1/15 and take MoviPrep as instructed.  Pt notified of changes in prep.  I informed pt of the importance of completing all of the prep as instructed.  Steven Logan

## 2011-02-03 ENCOUNTER — Encounter: Payer: Self-pay | Admitting: Internal Medicine

## 2011-02-08 ENCOUNTER — Encounter: Payer: BC Managed Care – PPO | Admitting: Internal Medicine

## 2011-02-10 ENCOUNTER — Encounter: Payer: BC Managed Care – PPO | Admitting: Internal Medicine

## 2011-02-16 ENCOUNTER — Encounter: Payer: BC Managed Care – PPO | Admitting: Internal Medicine

## 2011-02-16 ENCOUNTER — Encounter: Payer: Self-pay | Admitting: Internal Medicine

## 2011-04-02 ENCOUNTER — Ambulatory Visit: Payer: BC Managed Care – PPO | Admitting: Internal Medicine

## 2011-04-07 ENCOUNTER — Encounter: Payer: Self-pay | Admitting: Internal Medicine

## 2011-04-12 ENCOUNTER — Ambulatory Visit: Payer: BC Managed Care – PPO | Admitting: Internal Medicine

## 2011-04-15 ENCOUNTER — Other Ambulatory Visit: Payer: Self-pay | Admitting: Nephrology

## 2011-04-19 ENCOUNTER — Ambulatory Visit: Payer: BC Managed Care – PPO | Admitting: Internal Medicine

## 2011-04-19 ENCOUNTER — Other Ambulatory Visit (HOSPITAL_COMMUNITY): Payer: Self-pay | Admitting: *Deleted

## 2011-04-21 ENCOUNTER — Ambulatory Visit: Payer: BC Managed Care – PPO

## 2011-04-21 ENCOUNTER — Other Ambulatory Visit (HOSPITAL_COMMUNITY): Payer: Self-pay | Admitting: *Deleted

## 2011-04-21 ENCOUNTER — Ambulatory Visit: Payer: BC Managed Care – PPO | Admitting: Internal Medicine

## 2011-04-21 ENCOUNTER — Encounter (HOSPITAL_COMMUNITY)
Admission: RE | Admit: 2011-04-21 | Discharge: 2011-04-21 | Disposition: A | Discharge status: Home / Self Care | Payer: BC Managed Care – PPO | Source: Ambulatory Visit | Attending: Nephrology | Admitting: Nephrology

## 2011-04-21 DIAGNOSIS — D638 Anemia in other chronic diseases classified elsewhere: Secondary | ICD-10-CM | POA: Insufficient documentation

## 2011-04-21 DIAGNOSIS — N184 Chronic kidney disease, stage 4 (severe): Secondary | ICD-10-CM | POA: Insufficient documentation

## 2011-04-21 MED ORDER — CLONIDINE HCL 0.1 MG PO TABS
ORAL_TABLET | ORAL | Status: AC
  Filled 2011-04-21: qty 1

## 2011-04-21 MED ORDER — SODIUM CHLORIDE 0.9 % IV SOLN
INTRAVENOUS | Status: DC
  Administered 2011-04-21: 11:00:00 via INTRAVENOUS

## 2011-04-21 MED ORDER — CLONIDINE HCL 0.1 MG PO TABS
ORAL_TABLET | ORAL | Status: AC
  Administered 2011-04-21: 0.1 mg
  Filled 2011-04-21: qty 1

## 2011-04-21 MED ORDER — FERUMOXYTOL INJECTION 510 MG/17 ML
510.0000 mg | INTRAVENOUS | Status: DC
  Administered 2011-04-21: 510 mg via INTRAVENOUS
  Filled 2011-04-21: qty 17

## 2011-04-21 MED ORDER — DARBEPOETIN ALFA-POLYSORBATE 40 MCG/0.4ML IJ SOLN
INTRAMUSCULAR | Status: AC
  Administered 2011-04-21: 40 ug via SUBCUTANEOUS
  Filled 2011-04-21: qty 0.4

## 2011-04-21 MED ORDER — CLONIDINE HCL 0.1 MG PO TABS: 0.1000 mg | ORAL_TABLET | Freq: Once | ORAL | Status: AC | PRN

## 2011-04-21 MED ORDER — CLONIDINE HCL 0.1 MG PO TABS
0.1000 mg | ORAL_TABLET | Freq: Once | ORAL | Status: AC
  Administered 2011-04-21: 0.1 mg via ORAL

## 2011-04-28 ENCOUNTER — Ambulatory Visit: Payer: BC Managed Care – PPO

## 2011-04-28 ENCOUNTER — Encounter (HOSPITAL_COMMUNITY)
Admission: RE | Admit: 2011-04-28 | Discharge: 2011-04-28 | Disposition: A | Discharge status: Home / Self Care | Payer: BC Managed Care – PPO | Source: Ambulatory Visit | Attending: Nephrology | Admitting: Nephrology

## 2011-04-28 DIAGNOSIS — N184 Chronic kidney disease, stage 4 (severe): Secondary | ICD-10-CM | POA: Insufficient documentation

## 2011-04-28 DIAGNOSIS — D638 Anemia in other chronic diseases classified elsewhere: Secondary | ICD-10-CM | POA: Insufficient documentation

## 2011-04-28 LAB — POCT HEMOGLOBIN-HEMACUE: Hemoglobin: 8.9 g/dL — ABNORMAL LOW (ref 13.0–17.0)

## 2011-04-28 MED ORDER — SODIUM CHLORIDE 0.9 % IV SOLN
INTRAVENOUS | Status: DC
  Administered 2011-04-28: 250 mL via INTRAVENOUS

## 2011-04-28 MED ORDER — FERUMOXYTOL INJECTION 510 MG/17 ML
510.0000 mg | INTRAVENOUS | Status: AC
  Administered 2011-04-28: 510 mg via INTRAVENOUS
  Filled 2011-04-28: qty 17

## 2011-04-28 MED ORDER — DARBEPOETIN ALFA-POLYSORBATE 40 MCG/0.4ML IJ SOLN
40.0000 ug | INTRAMUSCULAR | Status: DC
  Administered 2011-04-28: 40 ug via SUBCUTANEOUS

## 2011-04-28 MED ORDER — DARBEPOETIN ALFA-POLYSORBATE 40 MCG/0.4ML IJ SOLN
INTRAMUSCULAR | Status: AC
  Filled 2011-04-28: qty 0.4

## 2011-04-28 NOTE — Progress Notes (Signed)
Meryle Ready, [place] for Dr Justin Mend advised of patient's consistently elevated blood pressure. Patient advised to call her today at the office per her instructions.  She will talk with patient and advise Dr Justin Mend who will make treatment adjustment as necessary.

## 2011-05-05 ENCOUNTER — Ambulatory Visit
Admission: RE | Admit: 2011-05-05 | Discharge: 2011-05-05 | Disposition: A | Discharge status: Home / Self Care | Payer: BC Managed Care – PPO | Source: Ambulatory Visit | Attending: Nephrology | Admitting: Nephrology

## 2011-05-05 ENCOUNTER — Encounter (HOSPITAL_COMMUNITY)
Admission: RE | Admit: 2011-05-05 | Discharge: 2011-05-05 | Disposition: A | Discharge status: Home / Self Care | Payer: BC Managed Care – PPO | Source: Ambulatory Visit | Attending: Nephrology | Admitting: Nephrology

## 2011-05-05 MED ORDER — DARBEPOETIN ALFA-POLYSORBATE 40 MCG/0.4ML IJ SOLN
INTRAMUSCULAR | Status: AC
  Filled 2011-05-05: qty 0.4

## 2011-05-05 MED ORDER — DARBEPOETIN ALFA-POLYSORBATE 40 MCG/0.4ML IJ SOLN: 40.0000 ug | INTRAMUSCULAR | Status: DC

## 2011-05-12 ENCOUNTER — Encounter (HOSPITAL_COMMUNITY)
Admission: RE | Admit: 2011-05-12 | Discharge: 2011-05-12 | Disposition: A | Discharge status: Home / Self Care | Payer: BC Managed Care – PPO | Source: Ambulatory Visit | Attending: Nephrology | Admitting: Nephrology

## 2011-05-12 ENCOUNTER — Other Ambulatory Visit: Payer: Self-pay | Admitting: Gastroenterology

## 2011-05-12 DIAGNOSIS — D509 Iron deficiency anemia, unspecified: Secondary | ICD-10-CM

## 2011-05-12 LAB — IRON AND TIBC: Saturation Ratios: 8 % — ABNORMAL LOW (ref 20–55)

## 2011-05-12 LAB — POCT HEMOGLOBIN-HEMACUE: Hemoglobin: 9 g/dL — ABNORMAL LOW (ref 13.0–17.0)

## 2011-05-12 MED ORDER — DARBEPOETIN ALFA-POLYSORBATE 40 MCG/0.4ML IJ SOLN
INTRAMUSCULAR | Status: AC
  Administered 2011-05-12: 40 ug
  Filled 2011-05-12: qty 0.4

## 2011-05-12 MED ORDER — PEG-KCL-NACL-NASULF-NA ASC-C 100 G PO SOLR: ORAL | Status: DC

## 2011-05-12 MED ORDER — DARBEPOETIN ALFA-POLYSORBATE 40 MCG/0.4ML IJ SOLN: 40.0000 ug | INTRAMUSCULAR | Status: DC

## 2011-05-18 ENCOUNTER — Other Ambulatory Visit (HOSPITAL_COMMUNITY): Payer: Self-pay | Admitting: *Deleted

## 2011-05-19 ENCOUNTER — Encounter (HOSPITAL_COMMUNITY)
Admission: RE | Admit: 2011-05-19 | Discharge: 2011-05-19 | Disposition: A | Discharge status: Home / Self Care | Payer: BC Managed Care – PPO | Source: Ambulatory Visit | Attending: Nephrology | Admitting: Nephrology

## 2011-05-19 ENCOUNTER — Encounter (HOSPITAL_COMMUNITY): Payer: BC Managed Care – PPO

## 2011-05-19 LAB — POCT HEMOGLOBIN-HEMACUE: Hemoglobin: 9.1 g/dL — ABNORMAL LOW (ref 13.0–17.0)

## 2011-05-19 MED ORDER — FERUMOXYTOL INJECTION 510 MG/17 ML
510.0000 mg | INTRAVENOUS | Status: DC
  Administered 2011-05-19: 510 mg via INTRAVENOUS
  Filled 2011-05-19: qty 17

## 2011-05-19 MED ORDER — SODIUM CHLORIDE 0.9 % IV SOLN
INTRAVENOUS | Status: DC
  Administered 2011-05-19: 09:00:00 via INTRAVENOUS

## 2011-05-19 MED ORDER — DARBEPOETIN ALFA-POLYSORBATE 40 MCG/0.4ML IJ SOLN
40.0000 ug | INTRAMUSCULAR | Status: DC
  Administered 2011-05-19: 40 ug via SUBCUTANEOUS
  Filled 2011-05-19: qty 0.4

## 2011-05-26 ENCOUNTER — Encounter (HOSPITAL_COMMUNITY)
Admission: RE | Admit: 2011-05-26 | Discharge: 2011-05-26 | Disposition: A | Discharge status: Home / Self Care | Payer: BC Managed Care – PPO | Source: Ambulatory Visit | Attending: Nephrology | Admitting: Nephrology

## 2011-05-26 DIAGNOSIS — N184 Chronic kidney disease, stage 4 (severe): Secondary | ICD-10-CM | POA: Insufficient documentation

## 2011-05-26 DIAGNOSIS — D638 Anemia in other chronic diseases classified elsewhere: Secondary | ICD-10-CM | POA: Insufficient documentation

## 2011-05-26 MED ORDER — SODIUM CHLORIDE 0.9 % IV SOLN
INTRAVENOUS | Status: AC
  Administered 2011-05-26: 08:00:00 via INTRAVENOUS

## 2011-05-26 MED ORDER — DARBEPOETIN ALFA-POLYSORBATE 40 MCG/0.4ML IJ SOLN
40.0000 ug | INTRAMUSCULAR | Status: DC
  Administered 2011-05-26: 40 ug via SUBCUTANEOUS
  Filled 2011-05-26: qty 0.4

## 2011-05-26 MED ORDER — FERUMOXYTOL INJECTION 510 MG/17 ML
510.0000 mg | INTRAVENOUS | Status: AC
  Administered 2011-05-26: 510 mg via INTRAVENOUS
  Filled 2011-05-26: qty 17

## 2011-06-02 ENCOUNTER — Encounter (HOSPITAL_COMMUNITY)
Admission: RE | Admit: 2011-06-02 | Discharge: 2011-06-02 | Disposition: A | Discharge status: Home / Self Care | Payer: BC Managed Care – PPO | Source: Ambulatory Visit | Attending: Nephrology | Admitting: Nephrology

## 2011-06-02 MED ORDER — DARBEPOETIN ALFA-POLYSORBATE 40 MCG/0.4ML IJ SOLN
40.0000 ug | INTRAMUSCULAR | Status: DC
Start: 2011-06-02 — End: 2011-06-03
  Administered 2011-06-02: 40 ug via SUBCUTANEOUS
  Filled 2011-06-02: qty 0.4

## 2011-06-03 ENCOUNTER — Telehealth: Payer: Self-pay | Admitting: *Deleted

## 2011-06-03 NOTE — Telephone Encounter (Signed)
Dr. Hilarie Fredrickson, Pt is coming in for his PV on Monday.  Do you want to hold his Coumadin 5 days, like we did with colonoscopy November 2012?  Thanks, [employer]

## 2011-06-04 ENCOUNTER — Other Ambulatory Visit: Payer: Self-pay | Admitting: Gastroenterology

## 2011-06-04 ENCOUNTER — Telehealth: Payer: Self-pay | Admitting: Gastroenterology

## 2011-06-04 DIAGNOSIS — D509 Iron deficiency anemia, unspecified: Secondary | ICD-10-CM

## 2011-06-04 MED ORDER — PEG-KCL-NACL-NASULF-NA ASC-C 100 G PO SOLR: ORAL | Status: DC

## 2011-06-04 NOTE — Telephone Encounter (Signed)
Noted  

## 2011-06-04 NOTE — Telephone Encounter (Signed)
Pt called to reschedule colonoscopy. To 07/06/2011 Spoke to [facility] Dr. Manya Silvas nurse. Lovonox bridge is needed for Pt. Let her know pt rescheduled colonoscopy she said she will call him to get him into their office for the bridge.

## 2011-06-04 NOTE — Telephone Encounter (Signed)
Called Dr. Nathaneil Canary office regarding pt;s Warfrin. Dr. Shanda Bumps is off this week and so his is assistant and nurse. They transferred me to Dr. Manya Silvas nurse Joelene Millin who is covering. I told her pt. is scheduled for Colonoscopy on 06/08/11 and we needed to know ASAP about holding his Warfrin. She said she will speak to Dr, Brigitte Pulse and get back to me.

## 2011-06-04 NOTE — Telephone Encounter (Signed)
Warfarin will need to be held. I will ask Stacy to re-contact PCP, or whomever manages his warfarin, to see if lovenox bridge is necessary. My original consult with him was Aug 2012 and I want to ensure the appropriate steps are taken in regard to his warfarin. Thanks

## 2011-06-07 ENCOUNTER — Other Ambulatory Visit (HOSPITAL_COMMUNITY): Payer: Self-pay | Admitting: *Deleted

## 2011-06-08 ENCOUNTER — Encounter: Payer: BC Managed Care – PPO | Admitting: Internal Medicine

## 2011-06-09 ENCOUNTER — Encounter (HOSPITAL_COMMUNITY)
Admission: RE | Admit: 2011-06-09 | Discharge: 2011-06-09 | Disposition: A | Discharge status: Home / Self Care | Payer: BC Managed Care – PPO | Source: Ambulatory Visit | Attending: Nephrology | Admitting: Nephrology

## 2011-06-09 LAB — RENAL FUNCTION PANEL
CO2: 17 mEq/L — ABNORMAL LOW (ref 19–32)
Calcium: 7.7 mg/dL — ABNORMAL LOW (ref 8.4–10.5)
GFR calc Af Amer: 26 mL/min — ABNORMAL LOW (ref >90)
GFR calc non Af Amer: 23 mL/min — ABNORMAL LOW (ref >90)
Phosphorus: 4.7 mg/dL — ABNORMAL HIGH (ref 2.3–4.6)
Potassium: 4.7 mEq/L (ref 3.5–5.1)
Sodium: 140 mEq/L (ref 135–145)

## 2011-06-09 LAB — FERRITIN: Ferritin: 400 ng/mL — ABNORMAL HIGH (ref 22–322)

## 2011-06-09 LAB — IRON AND TIBC: TIBC: 219 ug/dL (ref 215–435)

## 2011-06-09 MED ORDER — DARBEPOETIN ALFA-POLYSORBATE 40 MCG/0.4ML IJ SOLN
40.0000 ug | INTRAMUSCULAR | Status: DC
  Administered 2011-06-09: 40 ug via SUBCUTANEOUS

## 2011-06-09 MED ORDER — DARBEPOETIN ALFA-POLYSORBATE 40 MCG/0.4ML IJ SOLN
INTRAMUSCULAR | Status: AC
  Administered 2011-06-09: 40 ug via SUBCUTANEOUS
  Filled 2011-06-09: qty 0.4

## 2011-06-14 LAB — POCT HEMOGLOBIN-HEMACUE: Hemoglobin: 9.9 g/dL — ABNORMAL LOW (ref 13.0–17.0)

## 2011-06-16 ENCOUNTER — Encounter (HOSPITAL_COMMUNITY)
Admission: RE | Admit: 2011-06-16 | Discharge: 2011-06-16 | Disposition: A | Discharge status: Home / Self Care | Payer: BC Managed Care – PPO | Source: Ambulatory Visit | Attending: Nephrology | Admitting: Nephrology

## 2011-06-16 LAB — POCT HEMOGLOBIN-HEMACUE: Hemoglobin: 10.3 g/dL — ABNORMAL LOW (ref 13.0–17.0)

## 2011-06-16 MED ORDER — DARBEPOETIN ALFA-POLYSORBATE 40 MCG/0.4ML IJ SOLN
40.0000 ug | INTRAMUSCULAR | Status: DC
  Administered 2011-06-16: 40 ug via SUBCUTANEOUS

## 2011-06-16 MED ORDER — DARBEPOETIN ALFA-POLYSORBATE 25 MCG/0.42ML IJ SOLN
INTRAMUSCULAR | Status: AC
  Filled 2011-06-16: qty 0.42

## 2011-06-25 ENCOUNTER — Other Ambulatory Visit (HOSPITAL_COMMUNITY): Payer: Self-pay | Admitting: *Deleted

## 2011-06-28 ENCOUNTER — Encounter (HOSPITAL_COMMUNITY)
Admission: RE | Admit: 2011-06-28 | Discharge: 2011-06-28 | Disposition: A | Discharge status: Home / Self Care | Payer: BC Managed Care – PPO | Source: Ambulatory Visit | Attending: Nephrology | Admitting: Nephrology

## 2011-06-28 DIAGNOSIS — N184 Chronic kidney disease, stage 4 (severe): Secondary | ICD-10-CM | POA: Insufficient documentation

## 2011-06-28 DIAGNOSIS — D638 Anemia in other chronic diseases classified elsewhere: Secondary | ICD-10-CM | POA: Insufficient documentation

## 2011-06-28 LAB — POCT HEMOGLOBIN-HEMACUE: Hemoglobin: 10.1 g/dL — ABNORMAL LOW (ref 13.0–17.0)

## 2011-06-28 MED ORDER — DARBEPOETIN ALFA-POLYSORBATE 40 MCG/0.4ML IJ SOLN
INTRAMUSCULAR | Status: AC
  Filled 2011-06-28: qty 0.4

## 2011-06-28 MED ORDER — DARBEPOETIN ALFA-POLYSORBATE 40 MCG/0.4ML IJ SOLN
40.0000 ug | INTRAMUSCULAR | Status: DC
  Administered 2011-06-28: 40 ug via SUBCUTANEOUS

## 2011-07-05 ENCOUNTER — Encounter (HOSPITAL_COMMUNITY)
Admission: RE | Admit: 2011-07-05 | Discharge: 2011-07-05 | Disposition: A | Discharge status: Home / Self Care | Payer: BC Managed Care – PPO | Source: Ambulatory Visit | Attending: Nephrology | Admitting: Nephrology

## 2011-07-05 LAB — POCT HEMOGLOBIN-HEMACUE: Hemoglobin: 9.9 g/dL — ABNORMAL LOW (ref 13.0–17.0)

## 2011-07-05 MED ORDER — DARBEPOETIN ALFA-POLYSORBATE 40 MCG/0.4ML IJ SOLN
INTRAMUSCULAR | Status: AC
  Filled 2011-07-05: qty 0.4

## 2011-07-05 MED ORDER — DARBEPOETIN ALFA-POLYSORBATE 40 MCG/0.4ML IJ SOLN
40.0000 ug | INTRAMUSCULAR | Status: DC
  Administered 2011-07-05: 40 ug via SUBCUTANEOUS

## 2011-07-06 ENCOUNTER — Encounter: Payer: BC Managed Care – PPO | Admitting: Internal Medicine

## 2011-07-06 LAB — IRON AND TIBC: UIBC: 217 ug/dL (ref 125–400)

## 2011-07-06 LAB — FERRITIN: Ferritin: 316 ng/mL (ref 22–322)

## 2011-07-12 ENCOUNTER — Encounter (HOSPITAL_COMMUNITY)
Admission: RE | Admit: 2011-07-12 | Discharge: 2011-07-12 | Disposition: A | Discharge status: Home / Self Care | Payer: BC Managed Care – PPO | Source: Ambulatory Visit | Attending: Pediatrics | Admitting: Pediatrics

## 2011-07-12 LAB — POCT HEMOGLOBIN-HEMACUE: Hemoglobin: 10.9 g/dL — ABNORMAL LOW (ref 13.0–17.0)

## 2011-07-12 MED ORDER — DARBEPOETIN ALFA-POLYSORBATE 40 MCG/0.4ML IJ SOLN
40.0000 ug | INTRAMUSCULAR | Status: DC
  Administered 2011-07-12: 40 ug via SUBCUTANEOUS
  Filled 2011-07-12: qty 0.4

## 2011-07-16 ENCOUNTER — Other Ambulatory Visit (HOSPITAL_COMMUNITY): Payer: Self-pay | Admitting: *Deleted

## 2011-07-19 ENCOUNTER — Encounter (HOSPITAL_COMMUNITY)
Admission: RE | Admit: 2011-07-19 | Discharge: 2011-07-19 | Disposition: A | Discharge status: Home / Self Care | Payer: BC Managed Care – PPO | Source: Ambulatory Visit | Attending: Pediatrics | Admitting: Pediatrics

## 2011-07-19 LAB — POCT HEMOGLOBIN-HEMACUE: Hemoglobin: 10.4 g/dL — ABNORMAL LOW (ref 13.0–17.0)

## 2011-07-19 MED ORDER — DARBEPOETIN ALFA-POLYSORBATE 40 MCG/0.4ML IJ SOLN
40.0000 ug | INTRAMUSCULAR | Status: DC
  Administered 2011-07-19: 40 ug via SUBCUTANEOUS
  Filled 2011-07-19: qty 0.4

## 2011-07-26 ENCOUNTER — Encounter (HOSPITAL_COMMUNITY)
Admission: RE | Admit: 2011-07-26 | Discharge: 2011-07-26 | Disposition: A | Discharge status: Home / Self Care | Payer: BC Managed Care – PPO | Source: Ambulatory Visit | Attending: Nephrology | Admitting: Nephrology

## 2011-07-26 DIAGNOSIS — N184 Chronic kidney disease, stage 4 (severe): Secondary | ICD-10-CM | POA: Insufficient documentation

## 2011-07-26 DIAGNOSIS — D638 Anemia in other chronic diseases classified elsewhere: Secondary | ICD-10-CM | POA: Insufficient documentation

## 2011-07-26 LAB — POCT HEMOGLOBIN-HEMACUE: Hemoglobin: 10.6 g/dL — ABNORMAL LOW (ref 13.0–17.0)

## 2011-07-26 MED ORDER — DARBEPOETIN ALFA-POLYSORBATE 40 MCG/0.4ML IJ SOLN
40.0000 ug | INTRAMUSCULAR | Status: DC
  Administered 2011-07-26: 40 ug via SUBCUTANEOUS

## 2011-07-26 MED ORDER — DARBEPOETIN ALFA-POLYSORBATE 40 MCG/0.4ML IJ SOLN
INTRAMUSCULAR | Status: AC
  Filled 2011-07-26: qty 0.4

## 2011-07-30 ENCOUNTER — Other Ambulatory Visit (HOSPITAL_COMMUNITY): Payer: Self-pay | Admitting: *Deleted

## 2011-08-02 ENCOUNTER — Encounter (HOSPITAL_COMMUNITY): Payer: BC Managed Care – PPO

## 2011-08-04 ENCOUNTER — Encounter (HOSPITAL_COMMUNITY)
Admission: RE | Admit: 2011-08-04 | Discharge: 2011-08-04 | Disposition: A | Discharge status: Home / Self Care | Payer: BC Managed Care – PPO | Source: Ambulatory Visit | Attending: Nephrology | Admitting: Nephrology

## 2011-08-04 MED ORDER — FERUMOXYTOL INJECTION 510 MG/17 ML
510.0000 mg | INTRAVENOUS | Status: DC
  Administered 2011-08-04: 510 mg via INTRAVENOUS
  Filled 2011-08-04: qty 17

## 2011-08-04 MED ORDER — SODIUM CHLORIDE 0.9 % IV SOLN
INTRAVENOUS | Status: DC
  Administered 2011-08-04: 13:00:00 via INTRAVENOUS

## 2011-08-04 MED ORDER — DARBEPOETIN ALFA-POLYSORBATE 40 MCG/0.4ML IJ SOLN
INTRAMUSCULAR | Status: AC
  Administered 2011-08-04: 40 ug via SUBCUTANEOUS
  Filled 2011-08-04: qty 0.4

## 2011-08-11 ENCOUNTER — Encounter (HOSPITAL_COMMUNITY)
Admission: RE | Admit: 2011-08-11 | Discharge: 2011-08-11 | Disposition: A | Discharge status: Home / Self Care | Payer: BC Managed Care – PPO | Source: Ambulatory Visit | Attending: Nephrology | Admitting: Nephrology

## 2011-08-11 LAB — POCT HEMOGLOBIN-HEMACUE: Hemoglobin: 11.7 g/dL — ABNORMAL LOW (ref 13.0–17.0)

## 2011-08-11 MED ORDER — FERUMOXYTOL INJECTION 510 MG/17 ML
510.0000 mg | INTRAVENOUS | Status: AC
  Administered 2011-08-11: 510 mg via INTRAVENOUS
  Filled 2011-08-11: qty 17

## 2011-08-11 MED ORDER — SODIUM CHLORIDE 0.9 % IV SOLN
INTRAVENOUS | Status: AC
  Administered 2011-08-11: 11:00:00 via INTRAVENOUS

## 2011-08-11 MED ORDER — DARBEPOETIN ALFA-POLYSORBATE 40 MCG/0.4ML IJ SOLN
40.0000 ug | INTRAMUSCULAR | Status: DC
  Administered 2011-08-11: 40 ug via SUBCUTANEOUS
  Filled 2011-08-11: qty 0.4

## 2011-08-18 ENCOUNTER — Encounter (HOSPITAL_COMMUNITY)
Admission: RE | Admit: 2011-08-18 | Discharge: 2011-08-18 | Disposition: A | Discharge status: Home / Self Care | Payer: BC Managed Care – PPO | Source: Ambulatory Visit | Attending: Nephrology | Admitting: Nephrology

## 2011-08-18 LAB — POCT HEMOGLOBIN-HEMACUE: Hemoglobin: 10.9 g/dL — ABNORMAL LOW (ref 13.0–17.0)

## 2011-08-18 MED ORDER — DARBEPOETIN ALFA-POLYSORBATE 40 MCG/0.4ML IJ SOLN
INTRAMUSCULAR | Status: AC
  Filled 2011-08-18: qty 0.4

## 2011-08-18 MED ORDER — DARBEPOETIN ALFA-POLYSORBATE 40 MCG/0.4ML IJ SOLN
40.0000 ug | INTRAMUSCULAR | Status: DC
  Administered 2011-08-18: 40 ug via SUBCUTANEOUS

## 2011-08-25 ENCOUNTER — Encounter (HOSPITAL_COMMUNITY)
Admission: RE | Admit: 2011-08-25 | Discharge: 2011-08-25 | Disposition: A | Discharge status: Home / Self Care | Payer: BC Managed Care – PPO | Source: Ambulatory Visit | Attending: Nephrology | Admitting: Nephrology

## 2011-08-25 MED ORDER — DARBEPOETIN ALFA-POLYSORBATE 40 MCG/0.4ML IJ SOLN: 40.0000 ug | INTRAMUSCULAR | Status: DC

## 2011-09-08 ENCOUNTER — Encounter (HOSPITAL_COMMUNITY)
Admission: RE | Admit: 2011-09-08 | Discharge: 2011-09-08 | Disposition: A | Discharge status: Home / Self Care | Payer: BC Managed Care – PPO | Source: Ambulatory Visit | Attending: Nephrology | Admitting: Nephrology

## 2011-09-08 DIAGNOSIS — N184 Chronic kidney disease, stage 4 (severe): Secondary | ICD-10-CM | POA: Insufficient documentation

## 2011-09-08 DIAGNOSIS — D638 Anemia in other chronic diseases classified elsewhere: Secondary | ICD-10-CM | POA: Insufficient documentation

## 2011-09-08 LAB — IRON AND TIBC: Iron: 65 ug/dL (ref 42–135)

## 2011-09-08 MED ORDER — DARBEPOETIN ALFA-POLYSORBATE 40 MCG/0.4ML IJ SOLN: 40.0000 ug | INTRAMUSCULAR | Status: DC

## 2011-09-09 LAB — POCT HEMOGLOBIN-HEMACUE: Hemoglobin: 12.5 g/dL — ABNORMAL LOW (ref 13.0–17.0)

## 2011-09-09 LAB — FERRITIN: Ferritin: 492 ng/mL — ABNORMAL HIGH (ref 22–322)

## 2011-09-22 ENCOUNTER — Encounter (HOSPITAL_COMMUNITY)
Admission: RE | Admit: 2011-09-22 | Discharge: 2011-09-22 | Disposition: A | Discharge status: Home / Self Care | Payer: BC Managed Care – PPO | Source: Ambulatory Visit | Attending: Nephrology | Admitting: Nephrology

## 2011-09-22 MED ORDER — DARBEPOETIN ALFA-POLYSORBATE 40 MCG/0.4ML IJ SOLN
40.0000 ug | INTRAMUSCULAR | Status: DC
  Administered 2011-09-22: 40 ug via SUBCUTANEOUS

## 2011-09-22 MED ORDER — DARBEPOETIN ALFA-POLYSORBATE 40 MCG/0.4ML IJ SOLN
INTRAMUSCULAR | Status: AC
  Administered 2011-09-22: 40 ug via SUBCUTANEOUS
  Filled 2011-09-22: qty 0.4

## 2011-09-30 ENCOUNTER — Encounter (HOSPITAL_COMMUNITY)
Admission: RE | Admit: 2011-09-30 | Discharge: 2011-09-30 | Disposition: A | Discharge status: Home / Self Care | Payer: BC Managed Care – PPO | Source: Ambulatory Visit | Attending: Nephrology | Admitting: Nephrology

## 2011-09-30 DIAGNOSIS — N184 Chronic kidney disease, stage 4 (severe): Secondary | ICD-10-CM | POA: Insufficient documentation

## 2011-09-30 DIAGNOSIS — D638 Anemia in other chronic diseases classified elsewhere: Secondary | ICD-10-CM | POA: Insufficient documentation

## 2011-09-30 LAB — POCT HEMOGLOBIN-HEMACUE: Hemoglobin: 10.9 g/dL — ABNORMAL LOW (ref 13.0–17.0)

## 2011-09-30 MED ORDER — DARBEPOETIN ALFA-POLYSORBATE 40 MCG/0.4ML IJ SOLN
40.0000 ug | INTRAMUSCULAR | Status: DC
  Administered 2011-09-30: 40 ug via SUBCUTANEOUS

## 2011-09-30 MED ORDER — DARBEPOETIN ALFA-POLYSORBATE 40 MCG/0.4ML IJ SOLN
INTRAMUSCULAR | Status: AC
  Filled 2011-09-30: qty 0.4

## 2011-10-07 ENCOUNTER — Encounter (HOSPITAL_COMMUNITY)
Admission: RE | Admit: 2011-10-07 | Discharge: 2011-10-07 | Disposition: A | Discharge status: Home / Self Care | Payer: BC Managed Care – PPO | Source: Ambulatory Visit | Attending: Nephrology | Admitting: Nephrology

## 2011-10-07 LAB — FERRITIN: Ferritin: 381 ng/mL — ABNORMAL HIGH (ref 22–322)

## 2011-10-07 LAB — IRON AND TIBC
Iron: 35 ug/dL — ABNORMAL LOW (ref 42–135)
Saturation Ratios: 15 % — ABNORMAL LOW (ref 20–55)
TIBC: 229 ug/dL (ref 215–435)
UIBC: 194 ug/dL (ref 125–400)

## 2011-10-07 LAB — POCT HEMOGLOBIN-HEMACUE: Hemoglobin: 10.9 g/dL — ABNORMAL LOW (ref 13.0–17.0)

## 2011-10-07 MED ORDER — DARBEPOETIN ALFA-POLYSORBATE 40 MCG/0.4ML IJ SOLN
40.0000 ug | INTRAMUSCULAR | Status: DC
  Administered 2011-10-07: 40 ug via SUBCUTANEOUS
  Filled 2011-10-07: qty 0.4

## 2011-10-14 ENCOUNTER — Encounter (HOSPITAL_COMMUNITY)
Admission: RE | Admit: 2011-10-14 | Discharge: 2011-10-14 | Disposition: A | Discharge status: Home / Self Care | Payer: BC Managed Care – PPO | Source: Ambulatory Visit | Attending: Nephrology | Admitting: Nephrology

## 2011-10-14 LAB — POCT HEMOGLOBIN-HEMACUE: Hemoglobin: 11.4 g/dL — ABNORMAL LOW (ref 13.0–17.0)

## 2011-10-14 MED ORDER — DARBEPOETIN ALFA-POLYSORBATE 40 MCG/0.4ML IJ SOLN
40.0000 ug | INTRAMUSCULAR | Status: DC
  Administered 2011-10-14: 40 ug via SUBCUTANEOUS

## 2011-10-14 MED ORDER — DARBEPOETIN ALFA-POLYSORBATE 40 MCG/0.4ML IJ SOLN
INTRAMUSCULAR | Status: AC
  Administered 2011-10-14: 40 ug via SUBCUTANEOUS
  Filled 2011-10-14: qty 0.4

## 2011-10-20 ENCOUNTER — Other Ambulatory Visit (HOSPITAL_COMMUNITY): Payer: Self-pay | Admitting: *Deleted

## 2011-10-21 ENCOUNTER — Encounter (HOSPITAL_COMMUNITY)
Admission: RE | Admit: 2011-10-21 | Discharge: 2011-10-21 | Disposition: A | Discharge status: Home / Self Care | Payer: BC Managed Care – PPO | Source: Ambulatory Visit | Attending: Nephrology | Admitting: Nephrology

## 2011-10-21 LAB — POCT HEMOGLOBIN-HEMACUE: Hemoglobin: 11.4 g/dL — ABNORMAL LOW (ref 13.0–17.0)

## 2011-10-21 MED ORDER — DARBEPOETIN ALFA-POLYSORBATE 40 MCG/0.4ML IJ SOLN
40.0000 ug | INTRAMUSCULAR | Status: DC
  Administered 2011-10-21: 40 ug via SUBCUTANEOUS

## 2011-10-21 MED ORDER — DARBEPOETIN ALFA-POLYSORBATE 40 MCG/0.4ML IJ SOLN
INTRAMUSCULAR | Status: AC
  Filled 2011-10-21: qty 0.4

## 2011-10-28 ENCOUNTER — Encounter (HOSPITAL_COMMUNITY)
Admission: RE | Admit: 2011-10-28 | Discharge: 2011-10-28 | Disposition: A | Discharge status: Home / Self Care | Payer: BC Managed Care – PPO | Source: Ambulatory Visit | Attending: Nephrology | Admitting: Nephrology

## 2011-10-28 DIAGNOSIS — N184 Chronic kidney disease, stage 4 (severe): Secondary | ICD-10-CM | POA: Insufficient documentation

## 2011-10-28 DIAGNOSIS — D638 Anemia in other chronic diseases classified elsewhere: Secondary | ICD-10-CM | POA: Insufficient documentation

## 2011-10-28 LAB — POCT HEMOGLOBIN-HEMACUE: Hemoglobin: 11.5 g/dL — ABNORMAL LOW (ref 13.0–17.0)

## 2011-10-28 MED ORDER — DARBEPOETIN ALFA-POLYSORBATE 40 MCG/0.4ML IJ SOLN
40.0000 ug | INTRAMUSCULAR | Status: DC
  Administered 2011-10-28: 40 ug via SUBCUTANEOUS

## 2011-10-28 MED ORDER — DARBEPOETIN ALFA-POLYSORBATE 40 MCG/0.4ML IJ SOLN
INTRAMUSCULAR | Status: AC
  Filled 2011-10-28: qty 0.4

## 2011-11-04 ENCOUNTER — Encounter (HOSPITAL_COMMUNITY)
Admission: RE | Admit: 2011-11-04 | Discharge: 2011-11-04 | Disposition: A | Discharge status: Home / Self Care | Payer: BC Managed Care – PPO | Source: Ambulatory Visit | Attending: Nephrology | Admitting: Nephrology

## 2011-11-04 MED ORDER — DARBEPOETIN ALFA-POLYSORBATE 40 MCG/0.4ML IJ SOLN
INTRAMUSCULAR | Status: AC
  Filled 2011-11-04: qty 0.4

## 2011-11-04 MED ORDER — DARBEPOETIN ALFA-POLYSORBATE 40 MCG/0.4ML IJ SOLN
40.0000 ug | INTRAMUSCULAR | Status: DC
  Administered 2011-11-04: 40 ug via SUBCUTANEOUS

## 2011-11-11 ENCOUNTER — Encounter (HOSPITAL_COMMUNITY)
Admission: RE | Admit: 2011-11-11 | Discharge: 2011-11-11 | Disposition: A | Discharge status: Home / Self Care | Payer: BC Managed Care – PPO | Source: Ambulatory Visit | Attending: Nephrology | Admitting: Nephrology

## 2011-11-11 LAB — POCT HEMOGLOBIN-HEMACUE: Hemoglobin: 11.2 g/dL — ABNORMAL LOW (ref 13.0–17.0)

## 2011-11-11 MED ORDER — DARBEPOETIN ALFA-POLYSORBATE 40 MCG/0.4ML IJ SOLN
40.0000 ug | INTRAMUSCULAR | Status: DC
  Administered 2011-11-11: 40 ug via SUBCUTANEOUS

## 2011-11-11 MED ORDER — DARBEPOETIN ALFA-POLYSORBATE 40 MCG/0.4ML IJ SOLN
INTRAMUSCULAR | Status: AC
  Filled 2011-11-11: qty 0.4

## 2011-11-18 ENCOUNTER — Encounter (HOSPITAL_COMMUNITY)
Admission: RE | Admit: 2011-11-18 | Discharge: 2011-11-18 | Disposition: A | Discharge status: Home / Self Care | Payer: BC Managed Care – PPO | Source: Ambulatory Visit | Attending: Nephrology | Admitting: Nephrology

## 2011-11-18 LAB — POCT HEMOGLOBIN-HEMACUE: Hemoglobin: 11.2 g/dL — ABNORMAL LOW (ref 13.0–17.0)

## 2011-11-18 MED ORDER — DARBEPOETIN ALFA-POLYSORBATE 40 MCG/0.4ML IJ SOLN
40.0000 ug | INTRAMUSCULAR | Status: DC
  Administered 2011-11-18: 40 ug via SUBCUTANEOUS

## 2011-11-18 MED ORDER — DARBEPOETIN ALFA-POLYSORBATE 40 MCG/0.4ML IJ SOLN
INTRAMUSCULAR | Status: AC
  Filled 2011-11-18: qty 0.4

## 2011-11-25 ENCOUNTER — Encounter (HOSPITAL_COMMUNITY)
Admission: RE | Admit: 2011-11-25 | Discharge: 2011-11-25 | Disposition: A | Discharge status: Home / Self Care | Payer: BC Managed Care – PPO | Source: Ambulatory Visit | Attending: Nephrology | Admitting: Nephrology

## 2011-11-25 MED ORDER — DARBEPOETIN ALFA-POLYSORBATE 40 MCG/0.4ML IJ SOLN
INTRAMUSCULAR | Status: AC
  Administered 2011-11-25: 40 ug via SUBCUTANEOUS
  Filled 2011-11-25: qty 0.4

## 2011-11-25 MED ORDER — DARBEPOETIN ALFA-POLYSORBATE 25 MCG/0.42ML IJ SOLN
INTRAMUSCULAR | Status: AC
  Filled 2011-11-25: qty 0.42

## 2011-11-25 MED ORDER — DARBEPOETIN ALFA-POLYSORBATE 40 MCG/0.4ML IJ SOLN
40.0000 ug | INTRAMUSCULAR | Status: DC
  Administered 2011-11-25: 40 ug via SUBCUTANEOUS

## 2011-12-02 ENCOUNTER — Encounter (HOSPITAL_COMMUNITY): Payer: BC Managed Care – PPO

## 2011-12-08 ENCOUNTER — Encounter (HOSPITAL_COMMUNITY)
Admission: RE | Admit: 2011-12-08 | Discharge: 2011-12-08 | Disposition: A | Discharge status: Home / Self Care | Payer: BC Managed Care – PPO | Source: Ambulatory Visit | Attending: Nephrology | Admitting: Nephrology

## 2011-12-08 DIAGNOSIS — N184 Chronic kidney disease, stage 4 (severe): Secondary | ICD-10-CM | POA: Insufficient documentation

## 2011-12-08 DIAGNOSIS — D638 Anemia in other chronic diseases classified elsewhere: Secondary | ICD-10-CM | POA: Insufficient documentation

## 2011-12-08 LAB — IRON AND TIBC
Saturation Ratios: 18 % — ABNORMAL LOW (ref 20–55)
TIBC: 228 ug/dL (ref 215–435)
UIBC: 186 ug/dL (ref 125–400)

## 2011-12-08 LAB — POCT HEMOGLOBIN-HEMACUE: Hemoglobin: 12.2 g/dL — ABNORMAL LOW (ref 13.0–17.0)

## 2011-12-08 MED ORDER — DARBEPOETIN ALFA-POLYSORBATE 40 MCG/0.4ML IJ SOLN: 40.0000 ug | INTRAMUSCULAR | Status: DC

## 2011-12-15 ENCOUNTER — Encounter (HOSPITAL_COMMUNITY)
Admission: RE | Admit: 2011-12-15 | Discharge: 2011-12-15 | Disposition: A | Discharge status: Home / Self Care | Payer: BC Managed Care – PPO | Source: Ambulatory Visit | Attending: Nephrology | Admitting: Nephrology

## 2011-12-22 ENCOUNTER — Encounter (HOSPITAL_COMMUNITY): Payer: BC Managed Care – PPO

## 2011-12-29 ENCOUNTER — Encounter (HOSPITAL_COMMUNITY)
Admission: RE | Admit: 2011-12-29 | Discharge: 2011-12-29 | Disposition: A | Discharge status: Home / Self Care | Payer: BC Managed Care – PPO | Source: Ambulatory Visit | Attending: Nephrology | Admitting: Nephrology

## 2011-12-29 DIAGNOSIS — D638 Anemia in other chronic diseases classified elsewhere: Secondary | ICD-10-CM | POA: Insufficient documentation

## 2011-12-29 DIAGNOSIS — N184 Chronic kidney disease, stage 4 (severe): Secondary | ICD-10-CM | POA: Insufficient documentation

## 2011-12-29 MED ORDER — DARBEPOETIN ALFA-POLYSORBATE 40 MCG/0.4ML IJ SOLN
40.0000 ug | INTRAMUSCULAR | Status: DC
  Administered 2011-12-29: 40 ug via SUBCUTANEOUS

## 2011-12-29 MED ORDER — DARBEPOETIN ALFA-POLYSORBATE 40 MCG/0.4ML IJ SOLN
INTRAMUSCULAR | Status: AC
  Filled 2011-12-29: qty 0.4

## 2012-01-03 ENCOUNTER — Encounter (HOSPITAL_COMMUNITY): Payer: BC Managed Care – PPO

## 2012-01-05 ENCOUNTER — Encounter (HOSPITAL_COMMUNITY)
Admission: RE | Admit: 2012-01-05 | Discharge: 2012-01-05 | Disposition: A | Discharge status: Home / Self Care | Payer: BC Managed Care – PPO | Source: Ambulatory Visit | Attending: Nephrology | Admitting: Nephrology

## 2012-01-05 LAB — POCT HEMOGLOBIN-HEMACUE: Hemoglobin: 11.7 g/dL — ABNORMAL LOW (ref 13.0–17.0)

## 2012-01-05 MED ORDER — DARBEPOETIN ALFA-POLYSORBATE 40 MCG/0.4ML IJ SOLN
40.0000 ug | INTRAMUSCULAR | Status: DC
  Administered 2012-01-05: 40 ug via SUBCUTANEOUS

## 2012-01-05 MED ORDER — DARBEPOETIN ALFA-POLYSORBATE 40 MCG/0.4ML IJ SOLN
INTRAMUSCULAR | Status: AC
  Filled 2012-01-05: qty 0.4

## 2012-01-06 LAB — IRON AND TIBC
Saturation Ratios: 14 % — ABNORMAL LOW (ref 20–55)
TIBC: 248 ug/dL (ref 215–435)

## 2012-01-06 LAB — FERRITIN: Ferritin: 365 ng/mL — ABNORMAL HIGH (ref 22–322)

## 2012-01-11 ENCOUNTER — Other Ambulatory Visit (HOSPITAL_COMMUNITY): Payer: Self-pay | Admitting: *Deleted

## 2012-01-12 ENCOUNTER — Encounter (HOSPITAL_COMMUNITY)
Admission: RE | Admit: 2012-01-12 | Discharge: 2012-01-12 | Disposition: A | Discharge status: Home / Self Care | Payer: BC Managed Care – PPO | Source: Ambulatory Visit | Attending: Nephrology | Admitting: Nephrology

## 2012-01-12 LAB — POCT HEMOGLOBIN-HEMACUE: Hemoglobin: 11.5 g/dL — ABNORMAL LOW (ref 13.0–17.0)

## 2012-01-12 MED ORDER — DARBEPOETIN ALFA-POLYSORBATE 40 MCG/0.4ML IJ SOLN
40.0000 ug | INTRAMUSCULAR | Status: DC
  Administered 2012-01-12: 40 ug via SUBCUTANEOUS

## 2012-01-12 MED ORDER — DARBEPOETIN ALFA-POLYSORBATE 40 MCG/0.4ML IJ SOLN
INTRAMUSCULAR | Status: AC
  Filled 2012-01-12: qty 0.4

## 2012-01-21 ENCOUNTER — Encounter (HOSPITAL_COMMUNITY): Payer: BC Managed Care – PPO

## 2012-01-27 ENCOUNTER — Encounter (HOSPITAL_COMMUNITY)
Admission: RE | Admit: 2012-01-27 | Discharge: 2012-01-27 | Disposition: A | Discharge status: Home / Self Care | Payer: BC Managed Care – PPO | Source: Ambulatory Visit | Attending: Nephrology | Admitting: Nephrology

## 2012-01-27 DIAGNOSIS — N184 Chronic kidney disease, stage 4 (severe): Secondary | ICD-10-CM | POA: Insufficient documentation

## 2012-01-27 DIAGNOSIS — D638 Anemia in other chronic diseases classified elsewhere: Secondary | ICD-10-CM | POA: Insufficient documentation

## 2012-01-27 LAB — POCT HEMOGLOBIN-HEMACUE: Hemoglobin: 11.6 g/dL — ABNORMAL LOW (ref 13.0–17.0)

## 2012-01-27 MED ORDER — DARBEPOETIN ALFA-POLYSORBATE 40 MCG/0.4ML IJ SOLN
INTRAMUSCULAR | Status: AC
  Filled 2012-01-27: qty 0.4

## 2012-01-27 MED ORDER — DARBEPOETIN ALFA-POLYSORBATE 40 MCG/0.4ML IJ SOLN
40.0000 ug | INTRAMUSCULAR | Status: DC
  Administered 2012-01-27: 40 ug via SUBCUTANEOUS

## 2012-02-02 ENCOUNTER — Encounter (HOSPITAL_COMMUNITY)
Admission: RE | Admit: 2012-02-02 | Discharge: 2012-02-02 | Disposition: A | Discharge status: Home / Self Care | Payer: BC Managed Care – PPO | Source: Ambulatory Visit | Attending: Nephrology | Admitting: Nephrology

## 2012-02-02 LAB — IRON AND TIBC
Iron: 64 ug/dL (ref 42–135)
TIBC: 257 ug/dL (ref 215–435)

## 2012-02-02 MED ORDER — DARBEPOETIN ALFA-POLYSORBATE 40 MCG/0.4ML IJ SOLN
INTRAMUSCULAR | Status: AC
  Filled 2012-02-02: qty 0.4

## 2012-02-02 MED ORDER — DARBEPOETIN ALFA-POLYSORBATE 40 MCG/0.4ML IJ SOLN
40.0000 ug | INTRAMUSCULAR | Status: DC
  Administered 2012-02-02: 40 ug via SUBCUTANEOUS

## 2012-02-09 ENCOUNTER — Encounter (HOSPITAL_COMMUNITY): Payer: BC Managed Care – PPO

## 2012-02-14 ENCOUNTER — Encounter (HOSPITAL_COMMUNITY)
Admission: RE | Admit: 2012-02-14 | Discharge: 2012-02-14 | Disposition: A | Discharge status: Home / Self Care | Payer: BC Managed Care – PPO | Source: Ambulatory Visit | Attending: Nephrology | Admitting: Nephrology

## 2012-02-14 LAB — CBC
Platelets: 246 10*3/uL (ref 150–400)
RDW: 16.5 % — ABNORMAL HIGH (ref 11.5–15.5)
WBC: 5.6 10*3/uL (ref 4.0–10.5)

## 2012-02-14 MED ORDER — CLONIDINE HCL 0.1 MG PO TABS
ORAL_TABLET | ORAL | Status: AC
  Administered 2012-02-14: 0.1 mg via ORAL
  Filled 2012-02-14: qty 1

## 2012-02-14 MED ORDER — CLONIDINE HCL 0.1 MG PO TABS: 0.1000 mg | ORAL_TABLET | Freq: Once | ORAL | Status: AC | PRN

## 2012-02-14 MED ORDER — DARBEPOETIN ALFA-POLYSORBATE 40 MCG/0.4ML IJ SOLN: 40.0000 ug | INTRAMUSCULAR | Status: DC

## 2012-02-14 NOTE — Progress Notes (Signed)
Pt's BP too high for injection.  Gave pt PRN clonidine per MD orders and waited 30 min and rechecked BP and it was higher.  Called Dr Justin Mend and let him know about his VS, that clonidine was given,  And his VS after clonidine were higher.  Orders received to send CBC down to lab, have the pt call [facility] for an appt ASAP,  to hold the shot today, and for him to reschedule at short stay after they see him in the office.  I gave the pt the number to [facility] and told him to call when he left today to get an appt this week because his BP was very high.  The pt stated that he would call them.

## 2012-02-24 ENCOUNTER — Other Ambulatory Visit: Payer: Self-pay | Admitting: *Deleted

## 2012-02-24 DIAGNOSIS — Z01810 Encounter for preprocedural cardiovascular examination: Secondary | ICD-10-CM

## 2012-03-14 ENCOUNTER — Encounter: Payer: Self-pay | Admitting: Vascular Surgery

## 2012-03-15 ENCOUNTER — Encounter (INDEPENDENT_AMBULATORY_CARE_PROVIDER_SITE_OTHER): Payer: BC Managed Care – PPO | Admitting: *Deleted

## 2012-03-15 ENCOUNTER — Encounter: Payer: Self-pay | Admitting: Vascular Surgery

## 2012-03-15 ENCOUNTER — Ambulatory Visit (INDEPENDENT_AMBULATORY_CARE_PROVIDER_SITE_OTHER): Payer: BC Managed Care – PPO | Admitting: Vascular Surgery

## 2012-03-15 VITALS — BP 203/117 | HR 73 | Ht 73.0 in | Wt 279.0 lb

## 2012-03-15 DIAGNOSIS — N186 End stage renal disease: Secondary | ICD-10-CM

## 2012-03-15 DIAGNOSIS — Z01810 Encounter for preprocedural cardiovascular examination: Secondary | ICD-10-CM

## 2012-03-15 NOTE — Progress Notes (Signed)
Vascular and Vein Specialist of [facility]  Patient name: Steven Logan MRN: [id number] DOB: [birth year] Sex: male  REASON FOR VISIT: evaluate for hemodialysis access. Referred by Dr. Edrick Oh  HPI: Steven Logan is a 42 y.o. male who is right-handed. He has chronic kidney disease secondary to diabetes and hypertension. He was referred for evaluation for access. He denies any recent uremic symptoms. Specifically he denies nausea, vomiting, palpitations, fatigue, or anorexia.  I have reviewed his records from Dr. Jason Nest office. He has chronic renal insufficiency likely diabetic nephropathy. He is and a positive. He also has poorly controlled blood pressure. In addition he has anemia which is stable. He is on Coumadin because of a history of bilateral pulmonary emboli. He states that he has been on Coumadin for several years for this reason.  Past Medical History  Diagnosis Date  . Eczema   . Morbid obesity   . Gout   . HTN (hypertension)   . Dyslipidemia   . Pulmonary embolism   . Abscess     great toe  . Pancreatitis   . Renal insufficiency   . Arthritis   . Depression   . Diabetes mellitus   . Anemia     Family History  Problem Relation Age of Onset  . Stroke Mother   . Diabetes Mother   . Hypertension Mother   . Cancer    . Coronary artery disease    . Hypertension Father   . Colon cancer Maternal Grandmother 10  . Hypertension Brother     SOCIAL HISTORY: History  Substance Use Topics  . Smoking status: Never Smoker   . Smokeless tobacco: Never Used  . Alcohol Use: 0.0 oz/week     Comment:  24oz beer  per week    No Known Allergies  Current Outpatient Prescriptions  Medication Sig Dispense Refill  . amLODipine-olmesartan (AZOR) 5-40 MG per tablet Take 1 tablet by mouth daily.        . carvedilol (COREG) 25 MG tablet Take 25 mg by mouth 2 (two) times daily with a meal.      . insulin aspart protamine-insulin aspart (NOVOLOG 70/30) (70-30) 100 UNIT/ML  injection Inject into the skin 2 (two) times daily with a meal. 50 units twice a day       . labetalol (NORMODYNE) 200 MG tablet Take 200 mg by mouth 2 (two) times daily.      . Nebivolol HCl (BYSTOLIC) 20 MG TABS Take by mouth.        . peg 3350 powder (MOVIPREP) 100 G SOLR moviprep-take as directed.  1 kit  0  . warfarin (COUMADIN) 10 MG tablet Take 10 mg by mouth daily. Five times a week(hold on Monday&friday)      . pantoprazole (PROTONIX) 40 MG tablet Take 1 tablet (40 mg total) by mouth daily.  30 tablet  5  . predniSONE (DELTASONE) 20 MG tablet Take 20 mg by mouth daily. As needed        No current facility-administered medications for this visit.    REVIEW OF SYSTEMS: Valu.Nieves ] denotes positive finding; [  ] denotes negative finding  CARDIOVASCULAR:  [ ]  chest pain   [ ]  chest pressure   [ ]  palpitations   [ ]  orthopnea   Valu.Nieves ] dyspnea on exertion   [ ]  claudication   [ ]  rest pain   [ ]  DVT   [ ]  phlebitis PULMONARY:   [ ]  productive cough   [ ]   asthma   [ ]  wheezing NEUROLOGIC:   [ ]  weakness  [ ]  paresthesias  [ ]  aphasia  [ ]  amaurosis  [ ]  dizziness HEMATOLOGIC:   [ ]  bleeding problems   [ ]  clotting disorders MUSCULOSKELETAL:  [ ]  joint pain   [ ]  joint swelling Valu.Nieves ] leg swelling GASTROINTESTINAL: [ ]   blood in stool  [ ]   hematemesis GENITOURINARY:  [ ]   dysuria  Valu.Nieves ]  hematuria PSYCHIATRIC:  [ ]  history of major depression INTEGUMENTARY:  [ ]  rashes  [ ]  ulcers CONSTITUTIONAL:  [ ]  fever   Valu.Nieves ] chills  PHYSICAL EXAM: Filed Vitals:   03/15/12 1458  BP: 203/117  Pulse: 73  Height: 6\' 1"  (1.854 m)  Weight: 279 lb (126.554 kg)  SpO2: 100%   Body mass index is 36.82 kg/(m^2). GENERAL: The patient is a well-nourished male, in no acute distress. The vital signs are documented above. CARDIOVASCULAR: There is a regular rate and rhythm. He has palpable brachial and radial pulses bilaterally. PULMONARY: There is good air exchange bilaterally without wheezing or rales. ABDOMEN:  Soft and non-tender with normal pitched bowel sounds.  MUSCULOSKELETAL: There are no major deformities or cyanosis. NEUROLOGIC: No focal weakness or paresthesias are detected. SKIN: There are no ulcers or rashes noted. PSYCHIATRIC: The patient has a normal affect.  DATA:  I have independently interpreted his vein mapping. In both arms, the cephalic vein in the forearm empties into the basilic system. Upper arm cephalic veins cannot be visualized. He appears to have an adequate size forearm cephalic vein bilaterally.  MEDICAL ISSUES:  End stage renal disease This patient appears to be a candidate for a left radiocephalic fistula or basilic vein transposition based on his vein map. 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 We will also need to stop his Coumadin 4 proximally 4 days prior to his surgery. Tentatively he is scheduled for placement of a fistula on 04/07/2012.I have explained the indications for placement of an AV fistula or AV graft. I've explained that if at all possible we will place an AV fistula.  I have reviewed the risks of placement of an AV fistula including but not limited to: failure of the fistula to mature, need for subsequent interventions, and thrombosis. All the patient's questions were answered and they are agreeable to proceed with surgery.    [place] [facility] [place] [facility]: [phone]

## 2012-03-15 NOTE — Assessment & Plan Note (Signed)
This patient appears to be a candidate for a left radiocephalic fistula or basilic vein transposition based on his vein map. 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 We will also need to stop his Coumadin 4 proximally 4 days prior to his surgery. Tentatively he is scheduled for placement of a fistula on 04/07/2012.I have explained the indications for placement of an AV fistula or AV graft. I've explained that if at all possible we will place an AV fistula.  I have reviewed the risks of placement of an AV fistula including but not limited to: failure of the fistula to mature, need for subsequent interventions, and thrombosis. All the patient's questions were answered and they are agreeable to proceed with surgery.

## 2012-03-28 ENCOUNTER — Other Ambulatory Visit: Payer: Self-pay

## 2012-03-28 ENCOUNTER — Encounter (HOSPITAL_COMMUNITY): Payer: Self-pay | Admitting: Pharmacy Technician

## 2012-04-05 ENCOUNTER — Encounter (HOSPITAL_COMMUNITY)
Admission: RE | Admit: 2012-04-05 | Discharge: 2012-04-05 | Disposition: A | Discharge status: Home / Self Care | Payer: BC Managed Care – PPO | Source: Ambulatory Visit | Attending: Vascular Surgery | Admitting: Vascular Surgery

## 2012-04-05 ENCOUNTER — Encounter (HOSPITAL_COMMUNITY): Payer: Self-pay

## 2012-04-05 ENCOUNTER — Ambulatory Visit (HOSPITAL_COMMUNITY)
Admission: RE | Admit: 2012-04-05 | Discharge: 2012-04-05 | Disposition: A | Discharge status: Home / Self Care | Payer: BC Managed Care – PPO | Source: Ambulatory Visit | Attending: Anesthesiology | Admitting: Anesthesiology

## 2012-04-05 DIAGNOSIS — I517 Cardiomegaly: Secondary | ICD-10-CM | POA: Insufficient documentation

## 2012-04-05 DIAGNOSIS — I120 Hypertensive chronic kidney disease with stage 5 chronic kidney disease or end stage renal disease: Secondary | ICD-10-CM | POA: Insufficient documentation

## 2012-04-05 DIAGNOSIS — N186 End stage renal disease: Secondary | ICD-10-CM | POA: Insufficient documentation

## 2012-04-05 DIAGNOSIS — Z01818 Encounter for other preprocedural examination: Secondary | ICD-10-CM | POA: Insufficient documentation

## 2012-04-05 DIAGNOSIS — R9431 Abnormal electrocardiogram [ECG] [EKG]: Secondary | ICD-10-CM | POA: Insufficient documentation

## 2012-04-05 DIAGNOSIS — Z01810 Encounter for preprocedural cardiovascular examination: Secondary | ICD-10-CM | POA: Insufficient documentation

## 2012-04-05 DIAGNOSIS — Z01812 Encounter for preprocedural laboratory examination: Secondary | ICD-10-CM | POA: Insufficient documentation

## 2012-04-05 HISTORY — DX: Shortness of breath: R06.02

## 2012-04-05 LAB — PROTIME-INR: Prothrombin Time: 21.6 seconds — ABNORMAL HIGH (ref 11.6–15.2)

## 2012-04-05 LAB — APTT: aPTT: 40 seconds — ABNORMAL HIGH (ref 24–37)

## 2012-04-05 NOTE — Pre-Procedure Instructions (Signed)
Jerone Bourlier Tobia  04/05/2012   Your procedure is scheduled on:  Friday, March 14th   Report to [facility] at 5:30 AM.  Call this number if you have problems the morning of surgery: [phone]   Remember:   Do not eat food or drink liquids after midnight Thursday.   Take these medicines the morning of surgery with A SIP OF WATER: Normodyne   Do not wear jewelry.  Do not wear lotions, powders, or colognes. You may NOT wear deodorant.             Men may shave face and neck.   Do not bring valuables to the hospital.  Contacts, dentures or bridgework may not be worn into surgery.   Leave suitcase in the car. After surgery it may be brought to your room.  For patients admitted to the hospital, checkout time is 11:00 AM the day of discharge.   Patients discharged the day of surgery will not be allowed to drive home.             A responsible adult will need to stay with you for the first 24 hrs after surgery.   Name and phone number of your driver:    Special Instructions: Shower using CHG 2 nights before surgery and the night before surgery.  If you shower the day of surgery use CHG.  Use special wash - you have one bottle of CHG for all showers.  You should use approximately 1/3 of the bottle for each shower.   Please read over the following fact sheets that you were given: Pain Booklet, MRSA Information and Surgical Site Infection Prevention

## 2012-04-05 NOTE — Progress Notes (Addendum)
04/05/12 1510  OBSTRUCTIVE SLEEP APNEA  Have you ever been diagnosed with sleep apnea through a sleep study? No  Do you snore loudly (loud enough to be heard through closed doors)?  1  Do you often feel tired, fatigued, or sleepy during the daytime? 1  Has anyone observed you stop breathing during your sleep? 0  Do you have, or are you being treated for high blood pressure? 1  BMI more than 35 kg/m2? 1  Age over 42 years old? 1  Neck circumference greater than 40 cm/18 inches? 0  Gender: 1  Obstructive Sleep Apnea Score 6  Score 4 or greater  Results sent to PCP  Pt states he has seen NO cardiologist.......denies any chest discomfort at this time.Marland KitchenDA

## 2012-04-06 ENCOUNTER — Encounter (HOSPITAL_COMMUNITY): Payer: Self-pay

## 2012-04-06 MED ORDER — SODIUM CHLORIDE 0.9 % IV SOLN: INTRAVENOUS | Status: DC

## 2012-04-06 MED ORDER — CEFUROXIME SODIUM 1.5 G IJ SOLR
1.5000 g | INTRAMUSCULAR | Status: AC
  Administered 2012-04-07: 1.5 g via INTRAVENOUS
  Filled 2012-04-06: qty 1.5

## 2012-04-06 NOTE — Progress Notes (Signed)
Anesthesia chart review: Patient is a 42 year old male scheduled for creation of left arm arteriovenous fistula versus basilic vein transportation on 04/08/2011. 3 includes chronic kidney disease, hypertension, obesity, dyslipidemia, anemia, pancreatitis, bilateral massive pulmonary embolism '08, diabetes mellitus on insuline diagnosed in 1997, gout, depression. OSA screening score is 6. He is not yet on hemodialysis.  He works as a Training and development officer.  PCP is listed as Dr. Prince Solian.  CXR on 04/05/12 showed: 1. Cardiac enlargement without heart failure.  2. Interval resolution of the previously noted bilateral airspace opacities.   Preoperative labs noted.  He will need an ISTAT and repeat PT/PTT on arrival.  EKG on 04/05/12 showed NSR, minimal voltage criteria for LVH, ST/T wave abnormality, consider lateral ischemia, prolonged QT.  Lateral changes appear new since 2008.  I called and spoke with patient.  He denies prior cardiac studies such as stress and echo.  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  He denies chest pain, SOB, or CHF.  He reports he can walk stairs and felt he can walk 1/2 - 1 mile without difficultly.  BP was 158/102 at PAT (still elevated, but improved from 203/117 on 03/15/12--he was changed to labetalol within the past month.)    I reviewed above with Anesthesiologist Dr. Orene Desanctis.  Patient denies CV symptoms and reports reasonable exercise tolerance.  He will be evaluated by his anesthesiologist preoperatively.  If labs and BP acceptable and no new CV/CHF symptoms then would anticipate he could proceed as planned.  [facility] Short Stay Center/Anesthesiology Phone [phone] 04/06/2012 11:28 AM

## 2012-04-07 ENCOUNTER — Ambulatory Visit (HOSPITAL_COMMUNITY)
Admission: RE | Admit: 2012-04-07 | Discharge: 2012-04-07 | Disposition: A | Discharge status: Home / Self Care | Payer: BC Managed Care – PPO | Source: Ambulatory Visit | Attending: Vascular Surgery | Admitting: Vascular Surgery

## 2012-04-07 ENCOUNTER — Encounter (HOSPITAL_COMMUNITY): Payer: Self-pay | Admitting: Vascular Surgery

## 2012-04-07 ENCOUNTER — Encounter (HOSPITAL_COMMUNITY)
Admission: RE | Disposition: A | Discharge status: Home / Self Care | Payer: Self-pay | Source: Ambulatory Visit | Attending: Vascular Surgery

## 2012-04-07 ENCOUNTER — Encounter (HOSPITAL_COMMUNITY): Payer: Self-pay | Admitting: *Deleted

## 2012-04-07 ENCOUNTER — Ambulatory Visit (HOSPITAL_COMMUNITY): Payer: BC Managed Care – PPO | Admitting: Anesthesiology

## 2012-04-07 DIAGNOSIS — E119 Type 2 diabetes mellitus without complications: Secondary | ICD-10-CM | POA: Insufficient documentation

## 2012-04-07 DIAGNOSIS — N184 Chronic kidney disease, stage 4 (severe): Secondary | ICD-10-CM | POA: Insufficient documentation

## 2012-04-07 DIAGNOSIS — I129 Hypertensive chronic kidney disease with stage 1 through stage 4 chronic kidney disease, or unspecified chronic kidney disease: Secondary | ICD-10-CM | POA: Insufficient documentation

## 2012-04-07 DIAGNOSIS — E785 Hyperlipidemia, unspecified: Secondary | ICD-10-CM | POA: Insufficient documentation

## 2012-04-07 DIAGNOSIS — N186 End stage renal disease: Secondary | ICD-10-CM

## 2012-04-07 DIAGNOSIS — M109 Gout, unspecified: Secondary | ICD-10-CM | POA: Insufficient documentation

## 2012-04-07 HISTORY — PX: AV FISTULA PLACEMENT: SHX1204

## 2012-04-07 LAB — PROTIME-INR: INR: 1.56 — ABNORMAL HIGH (ref 0.00–1.49)

## 2012-04-07 LAB — APTT: aPTT: 45 seconds — ABNORMAL HIGH (ref 24–37)

## 2012-04-07 LAB — POCT I-STAT 4, (NA,K, GLUC, HGB,HCT)
HCT: 28 % — ABNORMAL LOW (ref 39.0–52.0)
Sodium: 144 mEq/L (ref 135–145)

## 2012-04-07 LAB — GLUCOSE, CAPILLARY: Glucose-Capillary: 104 mg/dL — ABNORMAL HIGH (ref 70–99)

## 2012-04-07 SURGERY — ARTERIOVENOUS (AV) FISTULA CREATION
Anesthesia: General | Site: Arm Lower | Laterality: Left | Wound class: Clean

## 2012-04-07 MED ORDER — LIDOCAINE HCL (CARDIAC) 20 MG/ML IV SOLN
INTRAVENOUS | Status: DC | PRN
  Administered 2012-04-07: 100 mg via INTRAVENOUS

## 2012-04-07 MED ORDER — HEPARIN SODIUM (PORCINE) 1000 UNIT/ML IJ SOLN
INTRAMUSCULAR | Status: DC | PRN
  Administered 2012-04-07: 8000 [IU] via INTRAVENOUS

## 2012-04-07 MED ORDER — ARTIFICIAL TEARS OP OINT
TOPICAL_OINTMENT | OPHTHALMIC | Status: DC | PRN
  Administered 2012-04-07: 1 via OPHTHALMIC

## 2012-04-07 MED ORDER — PHENYLEPHRINE HCL 10 MG/ML IJ SOLN
INTRAMUSCULAR | Status: DC | PRN
  Administered 2012-04-07 (×3): 80 ug via INTRAVENOUS

## 2012-04-07 MED ORDER — FENTANYL CITRATE 0.05 MG/ML IJ SOLN
INTRAMUSCULAR | Status: DC | PRN
  Administered 2012-04-07 (×2): 100 ug via INTRAVENOUS
  Administered 2012-04-07: 50 ug via INTRAVENOUS

## 2012-04-07 MED ORDER — OXYCODONE HCL 5 MG PO TABS: 5.0000 mg | ORAL_TABLET | Freq: Once | ORAL | Status: DC | PRN

## 2012-04-07 MED ORDER — OXYCODONE HCL 5 MG/5ML PO SOLN: 5.0000 mg | Freq: Once | ORAL | Status: DC | PRN

## 2012-04-07 MED ORDER — THROMBIN 20000 UNITS EX SOLR
CUTANEOUS | Status: AC
  Filled 2012-04-07: qty 20000

## 2012-04-07 MED ORDER — ONDANSETRON HCL 4 MG/2ML IJ SOLN: 4.0000 mg | Freq: Four times a day (QID) | INTRAMUSCULAR | Status: DC | PRN

## 2012-04-07 MED ORDER — FENTANYL CITRATE 0.05 MG/ML IJ SOLN
INTRAMUSCULAR | Status: AC
  Filled 2012-04-07: qty 2

## 2012-04-07 MED ORDER — MIDAZOLAM HCL 5 MG/5ML IJ SOLN
INTRAMUSCULAR | Status: DC | PRN
  Administered 2012-04-07: 2 mg via INTRAVENOUS

## 2012-04-07 MED ORDER — ONDANSETRON HCL 4 MG/2ML IJ SOLN
INTRAMUSCULAR | Status: DC | PRN
  Administered 2012-04-07: 4 mg via INTRAVENOUS

## 2012-04-07 MED ORDER — MUPIROCIN 2 % EX OINT
TOPICAL_OINTMENT | CUTANEOUS | Status: AC
  Administered 2012-04-07: 1 via NASAL
  Filled 2012-04-07: qty 22

## 2012-04-07 MED ORDER — PROPOFOL 10 MG/ML IV BOLUS
INTRAVENOUS | Status: DC | PRN
  Administered 2012-04-07: 400 mg via INTRAVENOUS

## 2012-04-07 MED ORDER — FENTANYL CITRATE 0.05 MG/ML IJ SOLN
25.0000 ug | INTRAMUSCULAR | Status: DC | PRN
  Administered 2012-04-07 (×2): 25 ug via INTRAVENOUS

## 2012-04-07 MED ORDER — SODIUM CHLORIDE 0.9 % IV SOLN
INTRAVENOUS | Status: DC | PRN
  Administered 2012-04-07 (×2): via INTRAVENOUS

## 2012-04-07 MED ORDER — LIDOCAINE-EPINEPHRINE (PF) 1 %-1:200000 IJ SOLN
INTRAMUSCULAR | Status: AC
  Filled 2012-04-07: qty 10

## 2012-04-07 MED ORDER — EPHEDRINE SULFATE 50 MG/ML IJ SOLN
INTRAMUSCULAR | Status: DC | PRN
  Administered 2012-04-07 (×3): 10 mg via INTRAVENOUS
  Administered 2012-04-07: 5 mg via INTRAVENOUS

## 2012-04-07 MED ORDER — SODIUM CHLORIDE 0.9 % IR SOLN
Status: DC | PRN
  Administered 2012-04-07: 08:00:00

## 2012-04-07 MED ORDER — OXYCODONE-ACETAMINOPHEN 5-325 MG PO TABS: 1.0000 | ORAL_TABLET | ORAL | Status: DC | PRN

## 2012-04-07 MED ORDER — 0.9 % SODIUM CHLORIDE (POUR BTL) OPTIME
TOPICAL | Status: DC | PRN
  Administered 2012-04-07: 1000 mL

## 2012-04-07 MED ORDER — PROTAMINE SULFATE 10 MG/ML IV SOLN
INTRAVENOUS | Status: DC | PRN
  Administered 2012-04-07: 20 mg via INTRAVENOUS
  Administered 2012-04-07 (×2): 10 mg via INTRAVENOUS

## 2012-04-07 MED ORDER — MUPIROCIN 2 % EX OINT
TOPICAL_OINTMENT | Freq: Once | CUTANEOUS | Status: DC
  Filled 2012-04-07: qty 22

## 2012-04-07 SURGICAL SUPPLY — 44 items
CANISTER SUCTION 2500CC (MISCELLANEOUS) ×2 IMPLANT
CLIP TI MEDIUM 6 (CLIP) ×2 IMPLANT
CLIP TI WIDE RED SMALL 6 (CLIP) ×4 IMPLANT
CLOTH BEACON ORANGE TIMEOUT ST (SAFETY) ×2 IMPLANT
COVER PROBE W GEL 5X96 (DRAPES) IMPLANT
COVER SURGICAL LIGHT HANDLE (MISCELLANEOUS) ×2 IMPLANT
DECANTER SPIKE VIAL GLASS SM (MISCELLANEOUS) ×2 IMPLANT
DERMABOND ADVANCED (GAUZE/BANDAGES/DRESSINGS) ×1
DERMABOND ADVANCED .7 DNX12 (GAUZE/BANDAGES/DRESSINGS) ×1 IMPLANT
DRAIN PENROSE 1/2X12 LTX STRL (WOUND CARE) IMPLANT
ELECT REM PT RETURN 9FT ADLT (ELECTROSURGICAL) ×2
ELECTRODE REM PT RTRN 9FT ADLT (ELECTROSURGICAL) ×1 IMPLANT
GLOVE BIO SURGEON STRL SZ 6.5 (GLOVE) ×4 IMPLANT
GLOVE BIO SURGEON STRL SZ7.5 (GLOVE) ×2 IMPLANT
GLOVE BIOGEL PI IND STRL 6.5 (GLOVE) ×3 IMPLANT
GLOVE BIOGEL PI IND STRL 7.0 (GLOVE) ×4 IMPLANT
GLOVE BIOGEL PI IND STRL 7.5 (GLOVE) ×1 IMPLANT
GLOVE BIOGEL PI IND STRL 8 (GLOVE) ×3 IMPLANT
GLOVE BIOGEL PI INDICATOR 6.5 (GLOVE) ×3
GLOVE BIOGEL PI INDICATOR 7.0 (GLOVE) ×4
GLOVE BIOGEL PI INDICATOR 7.5 (GLOVE) ×1
GLOVE BIOGEL PI INDICATOR 8 (GLOVE) ×3
GLOVE SS BIOGEL STRL SZ 7 (GLOVE) ×1 IMPLANT
GLOVE SUPERSENSE BIOGEL SZ 7 (GLOVE) ×1
GLOVE SURG SS PI 6.5 STRL IVOR (GLOVE) ×4 IMPLANT
GOWN STRL NON-REIN LRG LVL3 (GOWN DISPOSABLE) ×8 IMPLANT
KIT BASIN OR (CUSTOM PROCEDURE TRAY) ×2 IMPLANT
KIT ROOM TURNOVER OR (KITS) ×2 IMPLANT
NS IRRIG 1000ML POUR BTL (IV SOLUTION) ×2 IMPLANT
PACK CV ACCESS (CUSTOM PROCEDURE TRAY) ×2 IMPLANT
PAD ARMBOARD 7.5X6 YLW CONV (MISCELLANEOUS) ×4 IMPLANT
SPONGE GAUZE 4X4 12PLY (GAUZE/BANDAGES/DRESSINGS) ×2 IMPLANT
SPONGE SURGIFOAM ABS GEL 100 (HEMOSTASIS) IMPLANT
SUT PROLENE 6 0 BV (SUTURE) ×2 IMPLANT
SUT SILK 3 0 (SUTURE) ×1
SUT SILK 3-0 18XBRD TIE 12 (SUTURE) ×1 IMPLANT
SUT VIC AB 3-0 SH 27 (SUTURE) ×1
SUT VIC AB 3-0 SH 27X BRD (SUTURE) ×1 IMPLANT
SUT VICRYL 4-0 PS2 18IN ABS (SUTURE) ×2 IMPLANT
SYR 20CC LL (SYRINGE) ×2 IMPLANT
TOWEL OR 17X24 6PK STRL BLUE (TOWEL DISPOSABLE) ×2 IMPLANT
TOWEL OR 17X26 10 PK STRL BLUE (TOWEL DISPOSABLE) ×2 IMPLANT
UNDERPAD 30X30 INCONTINENT (UNDERPADS AND DIAPERS) ×2 IMPLANT
WATER STERILE IRR 1000ML POUR (IV SOLUTION) ×2 IMPLANT

## 2012-04-07 NOTE — Preoperative (Signed)
Beta Blockers   Reason not to administer Beta Blockers:Not Applicable 

## 2012-04-07 NOTE — Progress Notes (Signed)
Notified Dr. Chriss Driver of pt's elevated blood pressure and that pt. Start on lovenox injections yesterday and took last dose this am at 0400.  Pt. Didn't apply mupirocin this am, I put a dose in prior to pt. Going to OR.

## 2012-04-07 NOTE — Transfer of Care (Signed)
Immediate Anesthesia Transfer of Care Note  Patient: Steven Logan  Procedure(s) Performed: Procedure(s): ARTERIOVENOUS (AV) FISTULA CREATION (Left)  Patient Location: PACU  Anesthesia Type:General  Level of Consciousness: awake, alert , oriented and sedated  Airway & Oxygen Therapy: Patient Spontanous Breathing and Patient connected to nasal cannula oxygen  Post-op Assessment: Report given to PACU RN, Post -op Vital signs reviewed and stable and Patient moving all extremities  Post vital signs: Reviewed and stable  Complications: No apparent anesthesia complications

## 2012-04-07 NOTE — Anesthesia Preprocedure Evaluation (Signed)
Anesthesia Evaluation  Patient identified by MRN, date of birth, ID band Patient awake    Reviewed: Allergy & Precautions, H&P , NPO status , Patient's Chart, lab work & pertinent test results  Airway Mallampati: II  Neck ROM: full    Dental   Pulmonary shortness of breath,          Cardiovascular hypertension, DVT     Neuro/Psych Depression    GI/Hepatic   Endo/Other  diabetes, Type 2Morbid obesity  Renal/GU ESRFRenal disease     Musculoskeletal  (+) Arthritis -,   Abdominal   Peds  Hematology   Anesthesia Other Findings   Reproductive/Obstetrics                           Anesthesia Physical Anesthesia Plan  ASA: III  Anesthesia Plan: General   Post-op Pain Management:    Induction: Intravenous  Airway Management Planned: LMA  Additional Equipment:   Intra-op Plan:   Post-operative Plan:   Informed Consent: I have reviewed the patients History and Physical, chart, labs and discussed the procedure including the risks, benefits and alternatives for the proposed anesthesia with the patient or authorized representative who has indicated his/her understanding and acceptance.     Plan Discussed with: CRNA and Surgeon  Anesthesia Plan Comments:         Anesthesia Quick Evaluation

## 2012-04-07 NOTE — Op Note (Signed)
NAME: Steven Logan   MRN: [id number] DOB: [birth year]    DATE OF OPERATION: 04/07/2012  PREOP DIAGNOSIS: Stage IV chronic kidney disease  POSTOP DIAGNOSIS: Same  PROCEDURE: Left radial cephalic AV fistula  SURGEON: Judeth Cornfield. Scot Dock, MD, FACS  ASSIST: Gerri Lins PA  ANESTHESIA: Gen.   EBL: minimal  INDICATIONS: [name] is a 42 y.o. male who is not yet on dialysis. We were asked to place an AV fistula.  FINDINGS: The forearm cephalic vein was 4 mm and emptied into the basilic vein. The radial artery was too a half to 3 mm.  TECHNIQUE: The patient was brought to the operating room and received a general anesthetic. The left upper extremity was prepped and draped in usual sterile fashion. An oblique incision was made in the wrist and the cephalic vein was dissected free and ligated distally. It irrigated up nicely with heparinized saline. The radial artery was dissected free beneath the fascia. It was approximately 2-1/2-3 mm with a good pulse. The patient was heparinized. The radial artery was clamped proximally and distally and a longitudinal arteriotomy was made. The vein was spatulated and sewn in side to the artery using continuous 6-0 Prolene suture. At the completion of the procedure there was a good thrill in the fistula. The heparin was partially reversed with protamine. The wounds closed the deep layer 3-0 Vicryl and the skin closed with 4-0 Vicryl. Dermabond was applied. The patient tolerated the procedure well and was transferred to the recovery room in stable condition. All needle and sponge counts were correct.  [name], MD, FACS Vascular and Vein Specialists of [facility]  DATE OF DICTATION:   04/07/2012

## 2012-04-07 NOTE — H&P (View-Only) (Signed)
Vascular and Vein Specialist of [facility]  Patient name: Steven Logan MRN: [id number] DOB: [birth year] Sex: male  REASON FOR VISIT: evaluate for hemodialysis access. Referred by Dr. Edrick Oh  HPI: Steven Logan is a 42 y.o. male who is right-handed. He has chronic kidney disease secondary to diabetes and hypertension. He was referred for evaluation for access. He denies any recent uremic symptoms. Specifically he denies nausea, vomiting, palpitations, fatigue, or anorexia.  I have reviewed his records from Dr. Jason Nest office. He has chronic renal insufficiency likely diabetic nephropathy. He is and a positive. He also has poorly controlled blood pressure. In addition he has anemia which is stable. He is on Coumadin because of a history of bilateral pulmonary emboli. He states that he has been on Coumadin for several years for this reason.  Past Medical History  Diagnosis Date  . Eczema   . Morbid obesity   . Gout   . HTN (hypertension)   . Dyslipidemia   . Pulmonary embolism   . Abscess     great toe  . Pancreatitis   . Renal insufficiency   . Arthritis   . Depression   . Diabetes mellitus   . Anemia     Family History  Problem Relation Age of Onset  . Stroke Mother   . Diabetes Mother   . Hypertension Mother   . Cancer    . Coronary artery disease    . Hypertension Father   . Colon cancer Maternal Grandmother 25  . Hypertension Brother     SOCIAL HISTORY: History  Substance Use Topics  . Smoking status: Never Smoker   . Smokeless tobacco: Never Used  . Alcohol Use: 0.0 oz/week     Comment:  24oz beer  per week    No Known Allergies  Current Outpatient Prescriptions  Medication Sig Dispense Refill  . amLODipine-olmesartan (AZOR) 5-40 MG per tablet Take 1 tablet by mouth daily.        . carvedilol (COREG) 25 MG tablet Take 25 mg by mouth 2 (two) times daily with a meal.      . insulin aspart protamine-insulin aspart (NOVOLOG 70/30) (70-30) 100 UNIT/ML  injection Inject into the skin 2 (two) times daily with a meal. 50 units twice a day       . labetalol (NORMODYNE) 200 MG tablet Take 200 mg by mouth 2 (two) times daily.      . Nebivolol HCl (BYSTOLIC) 20 MG TABS Take by mouth.        . peg 3350 powder (MOVIPREP) 100 G SOLR moviprep-take as directed.  1 kit  0  . warfarin (COUMADIN) 10 MG tablet Take 10 mg by mouth daily. Five times a week(hold on Monday&friday)      . pantoprazole (PROTONIX) 40 MG tablet Take 1 tablet (40 mg total) by mouth daily.  30 tablet  5  . predniSONE (DELTASONE) 20 MG tablet Take 20 mg by mouth daily. As needed        No current facility-administered medications for this visit.    REVIEW OF SYSTEMS: Valu.Nieves ] denotes positive finding; [  ] denotes negative finding  CARDIOVASCULAR:  [ ]  chest pain   [ ]  chest pressure   [ ]  palpitations   [ ]  orthopnea   Valu.Nieves ] dyspnea on exertion   [ ]  claudication   [ ]  rest pain   [ ]  DVT   [ ]  phlebitis PULMONARY:   [ ]  productive cough   [ ]   asthma   [ ]  wheezing NEUROLOGIC:   [ ]  weakness  [ ]  paresthesias  [ ]  aphasia  [ ]  amaurosis  [ ]  dizziness HEMATOLOGIC:   [ ]  bleeding problems   [ ]  clotting disorders MUSCULOSKELETAL:  [ ]  joint pain   [ ]  joint swelling Valu.Nieves ] leg swelling GASTROINTESTINAL: [ ]   blood in stool  [ ]   hematemesis GENITOURINARY:  [ ]   dysuria  Valu.Nieves ]  hematuria PSYCHIATRIC:  [ ]  history of major depression INTEGUMENTARY:  [ ]  rashes  [ ]  ulcers CONSTITUTIONAL:  [ ]  fever   Valu.Nieves ] chills  PHYSICAL EXAM: Filed Vitals:   03/15/12 1458  BP: 203/117  Pulse: 73  Height: 6\' 1"  (1.854 m)  Weight: 279 lb (126.554 kg)  SpO2: 100%   Body mass index is 36.82 kg/(m^2). GENERAL: The patient is a well-nourished male, in no acute distress. The vital signs are documented above. CARDIOVASCULAR: There is a regular rate and rhythm. He has palpable brachial and radial pulses bilaterally. PULMONARY: There is good air exchange bilaterally without wheezing or rales. ABDOMEN:  Soft and non-tender with normal pitched bowel sounds.  MUSCULOSKELETAL: There are no major deformities or cyanosis. NEUROLOGIC: No focal weakness or paresthesias are detected. SKIN: There are no ulcers or rashes noted. PSYCHIATRIC: The patient has a normal affect.  DATA:  I have independently interpreted his vein mapping. In both arms, the cephalic vein in the forearm empties into the basilic system. Upper arm cephalic veins cannot be visualized. He appears to have an adequate size forearm cephalic vein bilaterally.  MEDICAL ISSUES:  End stage renal disease This patient appears to be a candidate for a left radiocephalic fistula or basilic vein transposition based on his vein map. 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 We will also need to stop his Coumadin 4 proximally 4 days prior to his surgery. Tentatively he is scheduled for placement of a fistula on 04/07/2012.I have explained the indications for placement of an AV fistula or AV graft. I've explained that if at all possible we will place an AV fistula.  I have reviewed the risks of placement of an AV fistula including but not limited to: failure of the fistula to mature, need for subsequent interventions, and thrombosis. All the patient's questions were answered and they are agreeable to proceed with surgery.    [place] [facility] [place] Beeper: [phone]

## 2012-04-07 NOTE — Interval H&P Note (Signed)
History and Physical Interval Note:  04/07/2012 7:21 AM  Steven Logan  has presented today for surgery, with the diagnosis of End Stage Renal Disease  The various methods of treatment have been discussed with the patient and family. After consideration of risks, benefits and other options for treatment, the patient has consented to  Procedure(s) with comments: ARTERIOVENOUS (AV) FISTULA CREATION (Left) - Left Arm Arteriovenous Fistula Creation vs Basilic Vein Transposition as a surgical intervention .  [name] history has been reviewed, patient examined, no change in status, stable for surgery.  I have reviewed the patient's chart and labs.  Questions were answered to the patient's satisfaction.     DICKSON,CHRISTOPHER S

## 2012-04-07 NOTE — Anesthesia Postprocedure Evaluation (Signed)
Anesthesia Post Note  Patient: Steven Logan  Procedure(s) Performed: Procedure(s) (LRB): ARTERIOVENOUS (AV) FISTULA CREATION (Left)  Anesthesia type: General  Patient location: PACU  Post pain: Pain level controlled and Adequate analgesia  Post assessment: Post-op Vital signs reviewed, Patient's Cardiovascular Status Stable, Respiratory Function Stable, Patent Airway and Pain level controlled  Last Vitals:  Filed Vitals:   04/07/12 1045  BP:   Pulse: 65  Temp: 36.4 C  Resp: 9    Post vital signs: Reviewed and stable  Level of consciousness: awake, alert  and oriented  Complications: No apparent anesthesia complications

## 2012-04-08 ENCOUNTER — Encounter (HOSPITAL_COMMUNITY): Payer: Self-pay | Admitting: Vascular Surgery

## 2012-04-10 ENCOUNTER — Other Ambulatory Visit: Payer: Self-pay

## 2012-04-10 ENCOUNTER — Telehealth: Payer: Self-pay | Admitting: Vascular Surgery

## 2012-04-10 DIAGNOSIS — N186 End stage renal disease: Secondary | ICD-10-CM

## 2012-04-10 NOTE — Telephone Encounter (Addendum)
Message copied by Gena Fray on Mon Apr 10, 2012 11:14 AM ------      Message from: Denman George      Created: Mon Apr 10, 2012 10:28 AM      Regarding: FW: charge and f/u                   ----- Message -----         From: Angelia Mould, MD         Sent: 04/07/2012   9:26 AM           To: Patrici Ranks, Alfonso Patten, RN, #      Subject: charge and f/u                                           PROCEDURE: Left radial cephalic AV fistula            SURGEON: Judeth Cornfield. Scot Dock, MD, FACS            ASSIST: Gerri Lins PA            He will need a follow up visit in 6 weeks to check on the maturation of this fistula. He should have a duplex at that time. Thank you. CD ------  04/10/12: unable to reach patient, no answer and then phone loses connection. Mailed letter, dpm

## 2012-04-12 ENCOUNTER — Encounter: Payer: Self-pay | Admitting: *Deleted

## 2012-04-26 ENCOUNTER — Encounter (HOSPITAL_COMMUNITY): Payer: BC Managed Care – PPO

## 2012-05-02 ENCOUNTER — Other Ambulatory Visit (HOSPITAL_COMMUNITY): Payer: Self-pay | Admitting: *Deleted

## 2012-05-03 ENCOUNTER — Encounter (HOSPITAL_COMMUNITY)
Admission: RE | Admit: 2012-05-03 | Discharge: 2012-05-03 | Disposition: A | Discharge status: Home / Self Care | Payer: BC Managed Care – PPO | Source: Ambulatory Visit | Attending: Nephrology | Admitting: Nephrology

## 2012-05-03 DIAGNOSIS — N184 Chronic kidney disease, stage 4 (severe): Secondary | ICD-10-CM | POA: Insufficient documentation

## 2012-05-03 DIAGNOSIS — D638 Anemia in other chronic diseases classified elsewhere: Secondary | ICD-10-CM | POA: Insufficient documentation

## 2012-05-03 MED ORDER — DARBEPOETIN ALFA-POLYSORBATE 40 MCG/0.4ML IJ SOLN: 40.0000 ug | INTRAMUSCULAR | Status: DC

## 2012-05-03 MED ORDER — DARBEPOETIN ALFA-POLYSORBATE 40 MCG/0.4ML IJ SOLN
INTRAMUSCULAR | Status: AC
  Administered 2012-05-03: 40 ug via SUBCUTANEOUS
  Filled 2012-05-03: qty 0.4

## 2012-05-04 ENCOUNTER — Inpatient Hospital Stay (HOSPITAL_COMMUNITY)
Admission: EM | Admit: 2012-05-04 | Discharge: 2012-05-06 | DRG: 568 | Disposition: A | Discharge status: Home / Self Care | Payer: BC Managed Care – PPO | Attending: Internal Medicine | Admitting: Internal Medicine

## 2012-05-04 ENCOUNTER — Emergency Department (HOSPITAL_COMMUNITY): Payer: BC Managed Care – PPO

## 2012-05-04 ENCOUNTER — Encounter (HOSPITAL_COMMUNITY): Payer: Self-pay | Admitting: Emergency Medicine

## 2012-05-04 DIAGNOSIS — N2581 Secondary hyperparathyroidism of renal origin: Secondary | ICD-10-CM | POA: Diagnosis present

## 2012-05-04 DIAGNOSIS — Z8249 Family history of ischemic heart disease and other diseases of the circulatory system: Secondary | ICD-10-CM

## 2012-05-04 DIAGNOSIS — Z794 Long term (current) use of insulin: Secondary | ICD-10-CM

## 2012-05-04 DIAGNOSIS — Z79899 Other long term (current) drug therapy: Secondary | ICD-10-CM

## 2012-05-04 DIAGNOSIS — F329 Major depressive disorder, single episode, unspecified: Secondary | ICD-10-CM | POA: Diagnosis present

## 2012-05-04 DIAGNOSIS — I2699 Other pulmonary embolism without acute cor pulmonale: Secondary | ICD-10-CM

## 2012-05-04 DIAGNOSIS — N189 Chronic kidney disease, unspecified: Secondary | ICD-10-CM

## 2012-05-04 DIAGNOSIS — N185 Chronic kidney disease, stage 5: Secondary | ICD-10-CM

## 2012-05-04 DIAGNOSIS — M109 Gout, unspecified: Secondary | ICD-10-CM | POA: Diagnosis present

## 2012-05-04 DIAGNOSIS — N058 Unspecified nephritic syndrome with other morphologic changes: Secondary | ICD-10-CM | POA: Diagnosis present

## 2012-05-04 DIAGNOSIS — E785 Hyperlipidemia, unspecified: Secondary | ICD-10-CM

## 2012-05-04 DIAGNOSIS — D509 Iron deficiency anemia, unspecified: Secondary | ICD-10-CM

## 2012-05-04 DIAGNOSIS — D649 Anemia, unspecified: Secondary | ICD-10-CM | POA: Diagnosis present

## 2012-05-04 DIAGNOSIS — I509 Heart failure, unspecified: Secondary | ICD-10-CM

## 2012-05-04 DIAGNOSIS — J9600 Acute respiratory failure, unspecified whether with hypoxia or hypercapnia: Secondary | ICD-10-CM

## 2012-05-04 DIAGNOSIS — Z6835 Body mass index (BMI) 35.0-35.9, adult: Secondary | ICD-10-CM

## 2012-05-04 DIAGNOSIS — Z01810 Encounter for preprocedural cardiovascular examination: Secondary | ICD-10-CM

## 2012-05-04 DIAGNOSIS — E875 Hyperkalemia: Secondary | ICD-10-CM

## 2012-05-04 DIAGNOSIS — I10 Essential (primary) hypertension: Secondary | ICD-10-CM

## 2012-05-04 DIAGNOSIS — M129 Arthropathy, unspecified: Secondary | ICD-10-CM | POA: Diagnosis present

## 2012-05-04 DIAGNOSIS — I120 Hypertensive chronic kidney disease with stage 5 chronic kidney disease or end stage renal disease: Principal | ICD-10-CM | POA: Diagnosis present

## 2012-05-04 DIAGNOSIS — E1129 Type 2 diabetes mellitus with other diabetic kidney complication: Secondary | ICD-10-CM | POA: Diagnosis present

## 2012-05-04 DIAGNOSIS — Z86711 Personal history of pulmonary embolism: Secondary | ICD-10-CM

## 2012-05-04 DIAGNOSIS — Z7901 Long term (current) use of anticoagulants: Secondary | ICD-10-CM

## 2012-05-04 DIAGNOSIS — J811 Chronic pulmonary edema: Secondary | ICD-10-CM

## 2012-05-04 DIAGNOSIS — E1169 Type 2 diabetes mellitus with other specified complication: Secondary | ICD-10-CM | POA: Diagnosis present

## 2012-05-04 DIAGNOSIS — L259 Unspecified contact dermatitis, unspecified cause: Secondary | ICD-10-CM | POA: Diagnosis present

## 2012-05-04 DIAGNOSIS — N186 End stage renal disease: Secondary | ICD-10-CM

## 2012-05-04 DIAGNOSIS — F3289 Other specified depressive episodes: Secondary | ICD-10-CM | POA: Diagnosis present

## 2012-05-04 LAB — BASIC METABOLIC PANEL
BUN: 59 mg/dL — ABNORMAL HIGH (ref 6–23)
BUN: 57 mg/dL — ABNORMAL HIGH (ref 6–23)
CO2: 17 mEq/L — ABNORMAL LOW (ref 19–32)
Calcium: 6.3 mg/dL — CL (ref 8.4–10.5)
Calcium: 6.7 mg/dL — ABNORMAL LOW (ref 8.4–10.5)
Creatinine, Ser: 4.28 mg/dL — ABNORMAL HIGH (ref 0.50–1.35)
GFR calc Af Amer: 18 mL/min — ABNORMAL LOW (ref >90)
GFR calc non Af Amer: 16 mL/min — ABNORMAL LOW (ref >90)
GFR calc non Af Amer: 16 mL/min — ABNORMAL LOW (ref >90)
Glucose, Bld: 109 mg/dL — ABNORMAL HIGH (ref 70–99)
Potassium: 5.9 mEq/L — ABNORMAL HIGH (ref 3.5–5.1)
Potassium: 4.8 mEq/L (ref 3.5–5.1)
Sodium: 137 mEq/L (ref 135–145)

## 2012-05-04 LAB — PROTIME-INR
INR: 2.15 — ABNORMAL HIGH (ref 0.00–1.49)
Prothrombin Time: 23.1 seconds — ABNORMAL HIGH (ref 11.6–15.2)

## 2012-05-04 LAB — POCT I-STAT, CHEM 8
Creatinine, Ser: 4.1 mg/dL — ABNORMAL HIGH (ref 0.50–1.35)
Glucose, Bld: 181 mg/dL — ABNORMAL HIGH (ref 70–99)
Hemoglobin: 9.9 g/dL — ABNORMAL LOW (ref 13.0–17.0)
Potassium: 4.6 mEq/L (ref 3.5–5.1)

## 2012-05-04 LAB — CBC
HCT: 29 % — ABNORMAL LOW (ref 39.0–52.0)
Hemoglobin: 9.4 g/dL — ABNORMAL LOW (ref 13.0–17.0)
MCH: 26.2 pg (ref 26.0–34.0)
MCHC: 32.4 g/dL (ref 30.0–36.0)
RBC: 3.59 MIL/uL — ABNORMAL LOW (ref 4.22–5.81)

## 2012-05-04 LAB — URINALYSIS, ROUTINE W REFLEX MICROSCOPIC
Nitrite: NEGATIVE
Specific Gravity, Urine: 1.014 (ref 1.005–1.030)
Urobilinogen, UA: 0.2 mg/dL (ref 0.0–1.0)
pH: 5.5 (ref 5.0–8.0)

## 2012-05-04 LAB — HEPATIC FUNCTION PANEL
AST: 52 U/L — ABNORMAL HIGH (ref 0–37)
Albumin: 2.7 g/dL — ABNORMAL LOW (ref 3.5–5.2)
Total Protein: 7.5 g/dL (ref 6.0–8.3)

## 2012-05-04 LAB — URINE MICROSCOPIC-ADD ON

## 2012-05-04 LAB — GLUCOSE, CAPILLARY: Glucose-Capillary: 96 mg/dL (ref 70–99)

## 2012-05-04 LAB — POCT I-STAT TROPONIN I: Troponin i, poc: 0.03 ng/mL (ref 0.00–0.08)

## 2012-05-04 LAB — TROPONIN I: Troponin I: <0.3 ng/mL (ref <0.30)

## 2012-05-04 MED ORDER — FUROSEMIDE 10 MG/ML IJ SOLN
20.0000 mg | Freq: Once | INTRAMUSCULAR | Status: AC
  Administered 2012-05-04: 20 mg via INTRAVENOUS
  Filled 2012-05-04: qty 2

## 2012-05-04 MED ORDER — CALCIUM GLUCONATE 10 % IV SOLN
1.0000 g | Freq: Once | INTRAVENOUS | Status: AC
  Administered 2012-05-04: 1 g via INTRAVENOUS
  Filled 2012-05-04: qty 10

## 2012-05-04 MED ORDER — LABETALOL HCL 200 MG PO TABS
200.0000 mg | ORAL_TABLET | Freq: Two times a day (BID) | ORAL | Status: DC
Start: 2012-05-04 — End: 2012-05-06
  Administered 2012-05-04 – 2012-05-06 (×4): 200 mg via ORAL
  Filled 2012-05-04 (×5): qty 1

## 2012-05-04 MED ORDER — SODIUM CHLORIDE 0.9 % IJ SOLN
10.0000 mL | INTRAMUSCULAR | Status: DC | PRN
  Administered 2012-05-05 – 2012-05-06 (×4): 10 mL

## 2012-05-04 MED ORDER — SODIUM BICARBONATE 650 MG PO TABS
650.0000 mg | ORAL_TABLET | Freq: Two times a day (BID) | ORAL | Status: DC
  Administered 2012-05-04 – 2012-05-06 (×4): 650 mg via ORAL
  Filled 2012-05-04 (×5): qty 1

## 2012-05-04 MED ORDER — WARFARIN SODIUM 10 MG PO TABS
10.0000 mg | ORAL_TABLET | ORAL | Status: DC
  Filled 2012-05-04: qty 1

## 2012-05-04 MED ORDER — INSULIN ASPART 100 UNIT/ML ~~LOC~~ SOLN
10.0000 [IU] | Freq: Once | SUBCUTANEOUS | Status: AC
  Administered 2012-05-04: 10 [IU] via INTRAVENOUS
  Filled 2012-05-04: qty 1

## 2012-05-04 MED ORDER — WARFARIN SODIUM 5 MG PO TABS
5.0000 mg | ORAL_TABLET | ORAL | Status: DC
  Administered 2012-05-05: 5 mg via ORAL
  Filled 2012-05-04: qty 1

## 2012-05-04 MED ORDER — HYDRALAZINE HCL 20 MG/ML IJ SOLN
10.0000 mg | Freq: Four times a day (QID) | INTRAMUSCULAR | Status: DC | PRN
  Administered 2012-05-04: 10 mg via INTRAVENOUS
  Filled 2012-05-04: qty 1

## 2012-05-04 MED ORDER — SODIUM CHLORIDE 0.9 % IJ SOLN
3.0000 mL | Freq: Two times a day (BID) | INTRAMUSCULAR | Status: DC
  Administered 2012-05-05 – 2012-05-06 (×2): 3 mL via INTRAVENOUS

## 2012-05-04 MED ORDER — FUROSEMIDE 10 MG/ML IJ SOLN
100.0000 mg | Freq: Once | INTRAVENOUS | Status: AC
  Administered 2012-05-04: 100 mg via INTRAVENOUS
  Filled 2012-05-04: qty 10

## 2012-05-04 MED ORDER — FUROSEMIDE 10 MG/ML IJ SOLN
120.0000 mg | Freq: Four times a day (QID) | INTRAVENOUS | Status: DC
  Administered 2012-05-04 – 2012-05-05 (×3): 120 mg via INTRAVENOUS
  Filled 2012-05-04 (×7): qty 12

## 2012-05-04 MED ORDER — WARFARIN - PHARMACIST DOSING INPATIENT: Freq: Every day | Status: DC

## 2012-05-04 MED ORDER — SODIUM CHLORIDE 0.9 % IJ SOLN: 3.0000 mL | INTRAMUSCULAR | Status: DC | PRN

## 2012-05-04 MED ORDER — ACETAMINOPHEN 650 MG RE SUPP: 650.0000 mg | Freq: Four times a day (QID) | RECTAL | Status: DC | PRN

## 2012-05-04 MED ORDER — ACETAMINOPHEN 325 MG PO TABS
650.0000 mg | ORAL_TABLET | Freq: Four times a day (QID) | ORAL | Status: DC | PRN
Start: 2012-05-04 — End: 2012-05-06
  Administered 2012-05-05: 650 mg via ORAL
  Filled 2012-05-04: qty 2

## 2012-05-04 MED ORDER — SODIUM POLYSTYRENE SULFONATE 15 GM/60ML PO SUSP
30.0000 g | Freq: Once | ORAL | Status: AC
  Administered 2012-05-04: 30 g via ORAL
  Filled 2012-05-04: qty 120

## 2012-05-04 MED ORDER — SODIUM CHLORIDE 0.9 % IV SOLN: 250.0000 mL | INTRAVENOUS | Status: DC | PRN

## 2012-05-04 MED ORDER — FUROSEMIDE 10 MG/ML IJ SOLN: 100.0000 mg | Freq: Four times a day (QID) | INTRAVENOUS | Status: DC

## 2012-05-04 MED ORDER — ONDANSETRON HCL 4 MG PO TABS: 4.0000 mg | ORAL_TABLET | Freq: Four times a day (QID) | ORAL | Status: DC | PRN

## 2012-05-04 MED ORDER — ALBUTEROL SULFATE HFA 108 (90 BASE) MCG/ACT IN AERS
1.0000 | INHALATION_SPRAY | Freq: Four times a day (QID) | RESPIRATORY_TRACT | Status: DC | PRN
  Filled 2012-05-04: qty 6.7

## 2012-05-04 MED ORDER — SENNOSIDES-DOCUSATE SODIUM 8.6-50 MG PO TABS
1.0000 | ORAL_TABLET | Freq: Every evening | ORAL | Status: DC | PRN
  Filled 2012-05-04: qty 1

## 2012-05-04 MED ORDER — DEXTROSE 50 % IV SOLN
50.0000 mL | Freq: Once | INTRAVENOUS | Status: AC
  Administered 2012-05-04: 50 mL via INTRAVENOUS
  Filled 2012-05-04: qty 100

## 2012-05-04 MED ORDER — SODIUM CHLORIDE 0.9 % IJ SOLN
3.0000 mL | Freq: Two times a day (BID) | INTRAMUSCULAR | Status: DC
  Administered 2012-05-05: 3 mL via INTRAVENOUS

## 2012-05-04 MED ORDER — OXYCODONE HCL 5 MG PO TABS
5.0000 mg | ORAL_TABLET | ORAL | Status: DC | PRN
  Administered 2012-05-04: 5 mg via ORAL
  Filled 2012-05-04: qty 1

## 2012-05-04 MED ORDER — INSULIN ASPART PROT & ASPART (70-30 MIX) 100 UNIT/ML ~~LOC~~ SUSP
50.0000 [IU] | Freq: Two times a day (BID) | SUBCUTANEOUS | Status: DC
  Administered 2012-05-05: 50 [IU] via SUBCUTANEOUS
  Administered 2012-05-06: 25 [IU] via SUBCUTANEOUS
  Filled 2012-05-04: qty 10

## 2012-05-04 MED ORDER — ONDANSETRON HCL 4 MG/2ML IJ SOLN: 4.0000 mg | Freq: Four times a day (QID) | INTRAMUSCULAR | Status: DC | PRN

## 2012-05-04 NOTE — ED Notes (Signed)
Pt c/o neck pain. Requesting pain medication.

## 2012-05-04 NOTE — Progress Notes (Signed)
ANTICOAGULATION CONSULT NOTE - Initial Consult  Pharmacy Consult for Coumadin Indication: h/o PE  No Known Allergies  Patient Measurements:    Vital Signs: Temp: 98.2 F (36.8 C) (04/10 1005) Temp src: Oral (04/10 1005) BP: 192/107 mmHg (04/10 1730) Pulse Rate: 91 (04/10 1730)  Labs:  Recent Labs  05/04/12 1300 05/04/12 1725 05/04/12 1735  HGB 9.4*  --  9.9*  HCT 29.0*  --  29.0*  PLT 227  --   --   LABPROT 23.1*  --   --   INR 2.15*  --   --   CREATININE 4.27*  --  4.10*  TROPONINI  --  <0.30  --     The CrCl is unknown because both a height and weight (above a minimum accepted value) are required for this calculation.   Medical History: Past Medical History  Diagnosis Date  . Eczema   . Morbid obesity   . Gout   . HTN (hypertension)   . Dyslipidemia   . Pulmonary embolism     3  in  lungs  at  one time...  . Abscess     great toe  . Pancreatitis   . Renal insufficiency   . Arthritis   . Depression   . Diabetes mellitus   . Anemia   . Shortness of breath     ?? chest  cold he has now    Medications:  Home: Albuterol prn, Novolog 70/30, labetalol, percocet prn, sodium bicarb, coumadin 10mg  on Tues/Thur/Sat/Sun and coumadin 5 mg on Mon/Wed/Fri  Assessment: 42 y.o. male presents with SOB. Pt on chronic coumadin for h/o PE. Noted baseline INR therapeutic. No bleeding noted. Pt already took coumadin dose today.  Goal of Therapy:  INR 2-3 Monitor platelets by anticoagulation protocol: Yes   Plan:  1. Daily INR 2. Resume home coumadin dose -10mg  on Tues/Thur/Sat/Sun and coumadin 5 mg on Mon/Wed/Fri   Sherlon Handing, PharmD, BCPS Clinical pharmacist, pager [phone] 05/04/2012,6:48 PM

## 2012-05-04 NOTE — ED Notes (Signed)
Pt eating [employer], pt informed this is not the best food for him to eat especially with his labs being abnormal.

## 2012-05-04 NOTE — ED Provider Notes (Signed)
42 y.o. Male h.o. iddm esrd presents with sob for two, pleuritic chest pan, and bilateral lower extremity edema.  Nursing staff unable to obtain iv acces.  Ultrasound guided access attempted by md but unable to pass catheter. Radiology consulted for picc line placement.   Shaune Pollack, MD 05/08/12 [phone]

## 2012-05-04 NOTE — ED Notes (Signed)
Paged IV team 

## 2012-05-04 NOTE — ED Notes (Signed)
Paged IV team again.

## 2012-05-04 NOTE — H&P (Signed)
[facility]          History and Physical    PCP:   Tivis Ringer, MD   Chief Complaint:  Shortness of breath  HPI: Patient is a pleasant 42 year old African American man with history of stage V chronic kidney disease nearing dialysis to the point where he already has an AV fistula in place, hypertension, pulmonary embolism chronically anticoagulated on Coumadin. 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 In the hospital he has a chest x-ray that is consistent with pulmonary edema, he has a potassium of 5.9, a BUN of 53 and a creatinine of 4.10. He denies any fevers, chills, cough. We have been asked to admit him for further evaluation and management. He has already been seen by Dr. Justin Mend with renal.  Allergies:  No Known Allergies    Past Medical History  Diagnosis Date  . Eczema   . Morbid obesity   . Gout   . HTN (hypertension)   . Dyslipidemia   . Pulmonary embolism     3  in  lungs  at  one time...  . Abscess     great toe  . Pancreatitis   . Renal insufficiency   . Arthritis   . Depression   . Diabetes mellitus   . Anemia   . Shortness of breath     ?? chest  cold he has now    Past Surgical History  Procedure Laterality Date  . None    . Av fistula placement Left 04/07/2012    Procedure: ARTERIOVENOUS (AV) FISTULA CREATION;  Surgeon: Angelia Mould, MD;  Location: [facility];  Service: Vascular;  Laterality: Left;    Prior to Admission medications   Medication Sig Start Date End Date Taking? Authorizing Provider  albuterol (PROVENTIL HFA;VENTOLIN HFA) 108 (90 BASE) MCG/ACT inhaler Inhale 1 puff into the lungs every 6 (six) hours as needed for wheezing.   Yes Historical Provider, MD  insulin aspart  protamine-insulin aspart (NOVOLOG 70/30) (70-30) 100 UNIT/ML injection Inject into the skin 2 (two) times daily with a meal. 50 units twice a day    Yes Historical Provider, MD  labetalol (NORMODYNE) 200 MG tablet Take 200 mg by mouth 2 (two) times daily.   Yes Historical Provider, MD  oxyCODONE-acetaminophen (PERCOCET/ROXICET) 5-325 MG per tablet Take 1-2 tablets by mouth every 4 (four) hours as needed for pain.   Yes Historical Provider, MD  sodium bicarbonate 650 MG tablet Take 650 mg by mouth 2 (two) times daily.   Yes Historical Provider, MD  warfarin (COUMADIN) 10 MG tablet Take 10 mg by mouth every Tuesday, Thursday, Saturday, and Sunday at 6 PM.   Yes Historical Provider, MD  warfarin (COUMADIN) 5 MG tablet Take 5 mg by mouth every Monday, Wednesday, and Friday.   Yes Historical Provider, MD    Social History:  reports that he has never smoked. He has never used smokeless tobacco. He reports that he drinks about 0.6 ounces of alcohol per week. He reports that he does not use illicit drugs.  Family History  Problem Relation Age of Onset  . Stroke Mother   . Diabetes Mother   . Hypertension Mother   . Cancer    . Coronary artery disease    .  Hypertension Father   . Colon cancer Maternal Grandmother 60  . Hypertension Brother     Review of Systems:  Constitutional: Denies fever, chills, diaphoresis, appetite change and fatigue.  HEENT: Denies photophobia, eye pain, redness, hearing loss, ear pain, congestion, sore throat, rhinorrhea, sneezing, mouth sores, trouble swallowing, neck pain, neck stiffness and tinnitus.   Respiratory: Denies  cough, chest tightness,  and wheezing.   Cardiovascular: Denies chest pain, palpitations and leg swelling.  Gastrointestinal: Denies nausea, vomiting, abdominal pain, diarrhea, constipation, blood in stool and abdominal distention.  Genitourinary: Denies dysuria, urgency, frequency, hematuria, flank pain and difficulty urinating.  Musculoskeletal:  Denies myalgias, back pain, joint swelling, arthralgias and gait problem.  Skin: Denies pallor, rash and wound.  Neurological: Denies dizziness, seizures, syncope, weakness, light-headedness, numbness and headaches.  Hematological: Denies adenopathy. Easy bruising, personal or family bleeding history  Psychiatric/Behavioral: Denies suicidal ideation, mood changes, confusion, nervousness, sleep disturbance and agitation   Physical Exam: Blood pressure 176/101, pulse 93, temperature 98.2 F (36.8 C), temperature source Oral, resp. rate 19, SpO2 100.00%. General: Alert, awake, oriented x3, in no acute distress. Is not wearing oxygen, is eating dinner. HEENT: Normocephalic, atraumatic, pupils equal round and reactive to light, extraocular movements intact, moist mucous membranes. Neck: Supple, no JVD, no lymphadenopathy, no bruits, no goiter. Cardiovascular: Regular rate and rhythm, no murmurs, rubs or gallops. Lungs: Mild bi basilar crackles, no wheezes. Abdomen: Obese, soft, nontender, nondistended, positive bowel sounds, no masses or organomegaly noted. Extremities: 2-3+ edema bilaterally, positive pedal pulses. Neurologic: Grossly intact and nonfocal.  Labs on Admission:  Results for orders placed during the hospital encounter of 05/04/12 (from the past 48 hour(s))  URINALYSIS, ROUTINE W REFLEX MICROSCOPIC     Status: Abnormal   Collection Time    05/04/12 12:30 PM      Result Value Range   Color, Urine YELLOW  YELLOW   APPearance CLEAR  CLEAR   Specific Gravity, Urine 1.014  1.005 - 1.030   pH 5.5  5.0 - 8.0   Glucose, UA NEGATIVE  NEGATIVE mg/dL   Hgb urine dipstick MODERATE (*) NEGATIVE   Bilirubin Urine NEGATIVE  NEGATIVE   Ketones, ur NEGATIVE  NEGATIVE mg/dL   Protein, ur >300 (*) NEGATIVE mg/dL   Urobilinogen, UA 0.2  0.0 - 1.0 mg/dL   Nitrite NEGATIVE  NEGATIVE   Leukocytes, UA NEGATIVE  NEGATIVE  URINE MICROSCOPIC-ADD ON     Status: None   Collection Time    05/04/12  12:30 PM      Result Value Range   Squamous Epithelial / LPF RARE  RARE   WBC, UA 0-2  <3 WBC/hpf   RBC / HPF 3-6  <3 RBC/hpf   Bacteria, UA RARE  RARE  CBC     Status: Abnormal   Collection Time    05/04/12  1:00 PM      Result Value Range   WBC 5.8  4.0 - 10.5 K/uL   RBC 3.59 (*) 4.22 - 5.81 MIL/uL   Hemoglobin 9.4 (*) 13.0 - 17.0 g/dL   HCT 29.0 (*) 39.0 - 52.0 %   MCV 80.8  78.0 - 100.0 fL   MCH 26.2  26.0 - 34.0 pg   MCHC 32.4  30.0 - 36.0 g/dL   RDW 15.3  11.5 - 15.5 %   Platelets 227  150 - 400 K/uL  BASIC METABOLIC PANEL     Status: Abnormal   Collection Time    05/04/12  1:00 PM  Result Value Range   Sodium 137  135 - 145 mEq/L   Potassium 5.9 (*) 3.5 - 5.1 mEq/L   Chloride 110  96 - 112 mEq/L   CO2 17 (*) 19 - 32 mEq/L   Glucose, Bld 109 (*) 70 - 99 mg/dL   BUN 59 (*) 6 - 23 mg/dL   Creatinine, Ser 4.27 (*) 0.50 - 1.35 mg/dL   Calcium 6.3 (*) 8.4 - 10.5 mg/dL   Comment: CRITICAL RESULT CALLED TO, READ BACK BY AND VERIFIED WITH:     N BULLOCK,RN AT 1348 05/04/12 BY K BARR   GFR calc non Af Amer 16 (*) >90 mL/min   GFR calc Af Amer 18 (*) >90 mL/min   Comment:            The eGFR has been calculated     using the CKD EPI equation.     This calculation has not been     validated in all clinical     situations.     eGFR's persistently     <90 mL/min signify     possible Chronic Kidney Disease.  PRO B NATRIURETIC PEPTIDE     Status: Abnormal   Collection Time    05/04/12  1:00 PM      Result Value Range   Pro B Natriuretic peptide (BNP) 15434.0 (*) 0 - 125 pg/mL  PROTIME-INR     Status: Abnormal   Collection Time    05/04/12  1:00 PM      Result Value Range   Prothrombin Time 23.1 (*) 11.6 - 15.2 seconds   INR 2.15 (*) 0.00 - 1.49  HEPATIC FUNCTION PANEL     Status: Abnormal   Collection Time    05/04/12  1:00 PM      Result Value Range   Total Protein 7.5  6.0 - 8.3 g/dL   Albumin 2.7 (*) 3.5 - 5.2 g/dL   AST 52 (*) 0 - 37 U/L   ALT 95 (*) 0 - 53  U/L   Alkaline Phosphatase 88  39 - 117 U/L   Total Bilirubin 0.2 (*) 0.3 - 1.2 mg/dL   Bilirubin, Direct <0.1  0.0 - 0.3 mg/dL   Indirect Bilirubin NOT CALCULATED  0.3 - 0.9 mg/dL  POCT I-STAT TROPONIN I     Status: None   Collection Time    05/04/12  1:14 PM      Result Value Range   Troponin i, poc 0.03  0.00 - 0.08 ng/mL   Comment 3            Comment: Due to the release kinetics of cTnI,     a negative result within the first hours     of the onset of symptoms does not rule out     myocardial infarction with certainty.     If myocardial infarction is still suspected,     repeat the test at appropriate intervals.  GLUCOSE, CAPILLARY     Status: None   Collection Time    05/04/12  1:21 PM      Result Value Range   Glucose-Capillary 96  70 - 99 mg/dL  TROPONIN I     Status: None   Collection Time    05/04/12  5:25 PM      Result Value Range   Troponin I <0.30  <0.30 ng/mL   Comment:            Due to the release kinetics of cTnI,  a negative result within the first hours     of the onset of symptoms does not rule out     myocardial infarction with certainty.     If myocardial infarction is still suspected,     repeat the test at appropriate intervals.  POCT I-STAT, CHEM 8     Status: Abnormal   Collection Time    05/04/12  5:35 PM      Result Value Range   Sodium 142  135 - 145 mEq/L   Potassium 4.6  3.5 - 5.1 mEq/L   Chloride 116 (*) 96 - 112 mEq/L   BUN 53 (*) 6 - 23 mg/dL   Creatinine, Ser 4.10 (*) 0.50 - 1.35 mg/dL   Glucose, Bld 181 (*) 70 - 99 mg/dL   Calcium, Ion 0.84 (*) 1.12 - 1.23 mmol/L   TCO2 15  0 - 100 mmol/L   Hemoglobin 9.9 (*) 13.0 - 17.0 g/dL   HCT 29.0 (*) 39.0 - 52.0 %    Radiological Exams on Admission: Dg Chest 2 View  05/04/2012  *RADIOLOGY REPORT*  Clinical Data: Short of breath.  Chest pain.  CHEST - 2 VIEW  Comparison: 04/05/2012  Findings: Moderate cardiomegaly.  Vascular congestion.  Increasing airspace disease at the left base  increased left pleural effusion. Small right pleural effusion.  IMPRESSION: Cardiomegaly and vascular congestion.  Bilateral pleural effusions left greater than right.  New airspace disease at the left base.   Original Report Authenticated By: Marybelle Killings, M.D.    Ir Fluoro Guide Cv Midline Picc Right  05/04/2012  *RADIOLOGY REPORT*  Clinical data: Fluid overload.  No peripheral IV access cannot be obtained.  RIGHT IJ CENTRAL VENOUS CATHETER PLACEMENT UNDER ULTRASOUND AND FLUOROSCOPIC GUIDANCE:  Technique and findings:  The procedure, risks (including but not limited to bleeding, infection, organ damage), benefits, and alternatives were explained to the patient.  Questions regarding the procedure were encouraged and answered.  The patient understands and consents to the procedure. Patency of the right IJ vein was confirmed with ultrasound with image documentation. An appropriate skin site was determined. Skin site was marked. Region was prepped using maximum barrier technique including cap and mask, sterile gown, sterile gloves, large sterile sheet, and Chlorhexidine   as cutaneous antisepsis.  The region was infiltrated locally with 1% lidocaine.   Under real-time ultrasound guidance, the right IJ vein was accessed with a 21 gauge micropuncture needle; the needle tip within the vein was confirmed with ultrasound image documentation.  The  needle exchanged over a guidewire for peel-away sheath through which a 20cm 5 French PowerPICC dual lumen catheter was advanced. This was positioned with the tip at the cavoatrial junction. Spot chest radiograph shows good positioning and no pneumothorax. Catheter was flushed and sutured externally with 0-Prolene sutures. Patient tolerated the procedure well, with no immediate complication.  IMPRESSION: 1. Technically successful right IJ double lumen power injectable central venous catheter placement.   Original Report Authenticated By: D. Wallace Going, MD      Assessment/Plan Principal Problem:   Acute respiratory failure Active Problems:   HTN (hypertension)   Pulmonary embolism, bilateral   Pulmonary edema   CKD (chronic kidney disease) stage 5, GFR less than 15 ml/min   Hyperkalemia   Acute respiratory failure secondary to pulmonary edema -Presumed secondary to chronic kidney disease. Approaching end-stage renal disease. -Has already been seen by renal who is suggesting high doses of Lasix 100 mg every 6 hours. Monitor intake and output. -We  are hoping to avoid hemodialysis yet. -He currently does not have any oxygen requirement with good O2 sats.  Hyperkalemia -Has been given a dose of Kayexalate in the ED. -Has also been given insulin and D50 as well as an amp of calcium gluconate to stabilize the cardiac membrane. -I do not see any EKG abnormalities. -Will recheck potassium at 8 PM tonight and again tomorrow morning.  History of pulmonary embolism -Maintained on chronic anticoagulation with Coumadin. -Pharmacy to monitor. -INR is therapeutic on admission at 2.15.  Chronic kidney disease stage V -Reaching end-stage renal disease. -Already has AV fistula in place. -Rule is following.  Hypertension -Patient's blood pressures are quite elevated, suspect related to increased volume. -Hope to see some improvement with Lasix. -We'll place an order for when necessary hydralazine as well.  DVT prophylaxis -He is already fully anticoagulated on Coumadin for his pulmonary embolism.    Time Spent on Admission: 80 minutes.  Lelon Frohlich [facility] Pager: [phone] 05/04/2012, 6:22 PM

## 2012-05-04 NOTE — ED Notes (Signed)
Lab called with critical value - Calcium 6.3

## 2012-05-04 NOTE — ED Notes (Signed)
IV team attempted to gain IV access, unsuccessful. Suggested IV ultrasound

## 2012-05-04 NOTE — ED Notes (Signed)
Phlebotomy made aware RN unable to start IV and draw labs off IV.

## 2012-05-04 NOTE — ED Provider Notes (Signed)
History     CSN: [id number]  Arrival date & time 05/04/12  [id number]   First MD Initiated Contact with Patient 05/04/12 1137      Chief Complaint  Patient presents with  . Shortness of Breath    (Consider location/radiation/quality/duration/timing/severity/associated sxs/prior treatment) HPI Comments: Patient with hx DM, chronic kidney disease, PE on chronic coumadin, presents with 2 days of progressive SOB, chest pain, lower extremity edema.  SOB came on gradually, worse with laying flat and attempting to walk.   Chest pain is central anterior chest, worse with deep inspiration and with cough.  Leg swelling x 1 week. Occasional nausea.  Had a similar episode last week, seen by PCP, was put on albuterol inhaler which is not helping.  Denies fevers, cough, upper respiratory symptoms.   Pt has recently had AV fistula placed in left arm (04/07/12) that is healing well - anticipate dialysis when renal function drops to 10%, reported now 19%.    Patient is a 42 y.o. male presenting with shortness of breath. The history is provided by the patient.  Shortness of Breath Associated symptoms: chest pain   Associated symptoms: no abdominal pain, no cough, no fever and no vomiting     Past Medical History  Diagnosis Date  . Eczema   . Morbid obesity   . Gout   . HTN (hypertension)   . Dyslipidemia   . Pulmonary embolism     3  in  lungs  at  one time...  . Abscess     great toe  . Pancreatitis   . Renal insufficiency   . Arthritis   . Depression   . Diabetes mellitus   . Anemia   . Shortness of breath     ?? chest  cold he has now    Past Surgical History  Procedure Laterality Date  . None    . Av fistula placement Left 04/07/2012    Procedure: ARTERIOVENOUS (AV) FISTULA CREATION;  Surgeon: Angelia Mould, MD;  Location: [facility];  Service: Vascular;  Laterality: Left;    Family History  Problem Relation Age of Onset  . Stroke Mother   . Diabetes Mother   . Hypertension  Mother   . Cancer    . Coronary artery disease    . Hypertension Father   . Colon cancer Maternal Grandmother 65  . Hypertension Brother     History  Substance Use Topics  . Smoking status: Never Smoker   . Smokeless tobacco: Never Used  . Alcohol Use: 0.6 oz/week    1 Cans of beer per week     Comment:  24oz beer  per week      Review of Systems  Constitutional: Negative for fever and chills.  Respiratory: Positive for shortness of breath. Negative for cough.   Cardiovascular: Positive for chest pain and leg swelling. Negative for palpitations.  Gastrointestinal: Negative for nausea, vomiting, abdominal pain and diarrhea.  Genitourinary: Negative for dysuria, urgency and frequency.    Allergies  Review of patient's allergies indicates no known allergies.  Home Medications   Current Outpatient Rx  Name  Route  Sig  Dispense  Refill  . labetalol (NORMODYNE) 200 MG tablet   Oral   Take 200 mg by mouth 2 (two) times daily.         Marland Kitchen enoxaparin (LOVENOX) 120 MG/0.8ML injection   Subcutaneous   Inject 120 mg into the skin 2 (two) times daily.         Marland Kitchen  insulin aspart protamine-insulin aspart (NOVOLOG 70/30) (70-30) 100 UNIT/ML injection   Subcutaneous   Inject into the skin 2 (two) times daily with a meal. 50 units twice a day          . oxyCODONE-acetaminophen (ROXICET) 5-325 MG per tablet   Oral   Take 1-2 tablets by mouth every 4 (four) hours as needed for pain.   20 tablet   0   . sodium bicarbonate 650 MG tablet   Oral   Take 650 mg by mouth 2 (two) times daily.         Marland Kitchen warfarin (COUMADIN) 10 MG tablet   Oral   Take 10 mg by mouth 4 (four) times a week. 5mg  three times a week m,w,fr 10mg  4xweek tu,th,sa,su           BP 173/94  Pulse 81  Temp(Src) 98.2 F (36.8 C) (Oral)  Resp 20  SpO2 100%  Physical Exam  Nursing note and vitals reviewed. Constitutional: He appears well-developed and well-nourished. No distress.  HENT:  Head:  Normocephalic and atraumatic.  Neck: Neck supple.  Cardiovascular: Normal rate, regular rhythm and intact distal pulses.   Left forearm fistula with palpable thrill, audible bruit.  Well-healed.  No erythema, edema, warmth.   Pulmonary/Chest: Effort normal and breath sounds normal. No respiratory distress. He has no wheezes. He has no rales.  Shallow respirations  Abdominal: Soft. He exhibits no distension and no mass. There is no tenderness. There is no rebound and no guarding.  Musculoskeletal: He exhibits edema.  Tight bilateral lower extremity edema to mid shin  Neurological: He is alert. He exhibits normal muscle tone.  Skin: He is not diaphoretic.    ED Course  Procedures (including critical care time)  Labs Reviewed  CBC - Abnormal; Notable for the following:    RBC 3.59 (*)    Hemoglobin 9.4 (*)    HCT 29.0 (*)    All other components within normal limits  BASIC METABOLIC PANEL - Abnormal; Notable for the following:    Potassium 5.9 (*)    CO2 17 (*)    Glucose, Bld 109 (*)    BUN 59 (*)    Creatinine, Ser 4.27 (*)    Calcium 6.3 (*)    GFR calc non Af Amer 16 (*)    GFR calc Af Amer 18 (*)    All other components within normal limits  PRO B NATRIURETIC PEPTIDE - Abnormal; Notable for the following:    Pro B Natriuretic peptide (BNP) 15434.0 (*)    All other components within normal limits  PROTIME-INR - Abnormal; Notable for the following:    Prothrombin Time 23.1 (*)    INR 2.15 (*)    All other components within normal limits  URINALYSIS, ROUTINE W REFLEX MICROSCOPIC - Abnormal; Notable for the following:    Hgb urine dipstick MODERATE (*)    Protein, ur >300 (*)    All other components within normal limits  HEPATIC FUNCTION PANEL - Abnormal; Notable for the following:    Albumin 2.7 (*)    AST 52 (*)    ALT 95 (*)    Total Bilirubin 0.2 (*)    All other components within normal limits  URINE MICROSCOPIC-ADD ON  GLUCOSE, CAPILLARY  TROPONIN I  POCT  I-STAT TROPONIN I   Dg Chest 2 View  05/04/2012  *RADIOLOGY REPORT*  Clinical Data: Short of breath.  Chest pain.  CHEST - 2 VIEW  Comparison: 04/05/2012  Findings: Moderate cardiomegaly.  Vascular congestion.  Increasing airspace disease at the left base increased left pleural effusion. Small right pleural effusion.  IMPRESSION: Cardiomegaly and vascular congestion.  Bilateral pleural effusions left greater than right.  New airspace disease at the left base.   Original Report Authenticated By: Marybelle Killings, M.D.    2:06 PM Discussed patient with Dr Jeanell Sparrow.    2:22 PM I spoke with Dr Tana Coast, [facility] hospitalist, and flow manager.  Dr Tana Coast defers acceptance of this patient until he has IV access.  IV team is on the way for this purpose.  Also requests that I call nephrology, which I have done.   3:33 PM I spoke with Dr Justin Mend, who will come to see the patient.  Recommends central line instead of picc line, kayexelate, 120mg  IV lasix Q 6-8 hrs, repeat K later on.  Admission will be to hospitalist.  Still pending access.  IV team was unsuccessful.  Dr Jeanell Sparrow attempted US guided access but was unsuccessful.  I discussed central line vs PICC with Dr Jeanell Sparrow, PICC line already ordered and being placed by IR.    Date: 05/04/2012  Rate: 83  Rhythm: normal sinus rhythm  QRS Axis: normal  Intervals: normal  ST/T Wave abnormalities: nonspecific T wave changes and flipped T waves laterally  Conduction Disutrbances:none  Narrative Interpretation:   Old EKG Reviewed: unchanged, possible slight worsening of inverted Ts.   CRITICAL CARE Performed by: Clayton Bibles B   Total critical care time: 40 Critical care time was exclusive of separately billable procedures and treating other patients.  Critical care was necessary to treat or prevent imminent or life-threatening deterioration.  Critical care was time spent personally by me on the following activities: development of treatment plan with patient and/or surrogate as well  as nursing, discussions with consultants, evaluation of patient's response to treatment, examination of patient, obtaining history from patient or surrogate, ordering and performing treatments and interventions, ordering and review of laboratory studies, ordering and review of radiographic studies, pulse oximetry and re-evaluation of patient's condition.    1. ESRD (end stage renal disease)   2. CHF (congestive heart failure)   3. Hyperkalemia   4. Hypocalcemia      MDM  Pt with ESRD pre-dialysis (AV fistula placed 04/07/12) p/w SOB, DOE, orthopnea, peripheral edema x 2 days.  Pt fluid overloaded, CXR/BNP c/w CHF.  Hyperkalemic, hypocalcemic.  Difficulty establishing IV access on patient, attempted by several nurses, IV team, Dr Jeanell Sparrow, finally by IR.  Pt also seen in ED by Dr Justin Mend who will continue to consult.  5:20 PM Pt continues to by stable, I have repaged and admitted patient to hospitalist.  PICC line is in place, IV medications are running.  I have ordered repeat I-stat chem 8.          Clayton Bibles, PA-C 05/04/12 1721  [place], PA-C 05/04/12 1721

## 2012-05-04 NOTE — ED Notes (Signed)
Pt c/o shortness of breath onset yesterday and increased today. Pt also c/o center chest pain that radiated to left side with nausea.

## 2012-05-04 NOTE — ED Notes (Signed)
Attempted to gain IV access, unsuccessful.

## 2012-05-04 NOTE — ED Notes (Signed)
Spoke with IR, they will attempt to insert a PICC line as soon as possible. Dr. Jeanell Sparrow and Raquel Sarna, [place] notified.

## 2012-05-04 NOTE — ED Notes (Signed)
CBG 96. 

## 2012-05-04 NOTE — ED Notes (Signed)
Dr. Jeanell Sparrow attempted to gain IV access through ultrasound, unsuccessful.

## 2012-05-04 NOTE — ED Notes (Signed)
Pt refusing to be connected to monitor at this time.

## 2012-05-05 DIAGNOSIS — N186 End stage renal disease: Secondary | ICD-10-CM

## 2012-05-05 LAB — BASIC METABOLIC PANEL
BUN: 61 mg/dL — ABNORMAL HIGH (ref 6–23)
Calcium: 6.6 mg/dL — ABNORMAL LOW (ref 8.4–10.5)
Creatinine, Ser: 4.64 mg/dL — ABNORMAL HIGH (ref 0.50–1.35)
GFR calc non Af Amer: 14 mL/min — ABNORMAL LOW (ref >90)
Glucose, Bld: 107 mg/dL — ABNORMAL HIGH (ref 70–99)
Sodium: 138 mEq/L (ref 135–145)

## 2012-05-05 LAB — CBC
MCH: 25.9 pg — ABNORMAL LOW (ref 26.0–34.0)
MCHC: 32.6 g/dL (ref 30.0–36.0)
MCV: 79.3 fL (ref 78.0–100.0)
Platelets: 253 10*3/uL (ref 150–400)

## 2012-05-05 LAB — GLUCOSE, CAPILLARY
Glucose-Capillary: 111 mg/dL — ABNORMAL HIGH (ref 70–99)
Glucose-Capillary: 45 mg/dL — ABNORMAL LOW (ref 70–99)

## 2012-05-05 MED ORDER — DARBEPOETIN ALFA-POLYSORBATE 60 MCG/0.3ML IJ SOLN
60.0000 ug | INTRAMUSCULAR | Status: DC
  Filled 2012-05-05: qty 0.3

## 2012-05-05 MED ORDER — FUROSEMIDE 80 MG PO TABS
160.0000 mg | ORAL_TABLET | Freq: Two times a day (BID) | ORAL | Status: DC
  Administered 2012-05-05 – 2012-05-06 (×2): 160 mg via ORAL
  Filled 2012-05-05 (×5): qty 2

## 2012-05-05 MED ORDER — CALCITRIOL 0.25 MCG PO CAPS
0.2500 ug | ORAL_CAPSULE | Freq: Every day | ORAL | Status: DC
  Administered 2012-05-05 – 2012-05-06 (×2): 0.25 ug via ORAL
  Filled 2012-05-05 (×2): qty 1

## 2012-05-05 MED ORDER — INSULIN ASPART PROT & ASPART (70-30 MIX) 100 UNIT/ML ~~LOC~~ SUSP
25.0000 [IU] | Freq: Once | SUBCUTANEOUS | Status: AC
  Administered 2012-05-05: 25 [IU] via SUBCUTANEOUS

## 2012-05-05 NOTE — Progress Notes (Addendum)
Pt's CBG was 45,after a snack it came up to 87.MD. Has been notified.keep monitoring pt. Closely.

## 2012-05-05 NOTE — Progress Notes (Signed)
ANTICOAGULATION CONSULT NOTE - Follow-up  Pharmacy Consult for Coumadin Indication: h/o PE  No Known Allergies  Patient Measurements: Height: 6\' 1"  (185.4 cm) IBW/kg (Calculated) : 79.9  Vital Signs: Temp: 98.6 F (37 C) (04/11 0533) Temp src: Oral (04/11 0533) BP: 163/90 mmHg (04/11 0533) Pulse Rate: 80 (04/11 0533)  Labs:  Recent Labs  05/04/12 1300 05/04/12 1725 05/04/12 1735 05/04/12 1950 05/04/12 2300 05/05/12 0419  HGB 9.4*  --  9.9*  --   --  9.5*  HCT 29.0*  --  29.0*  --   --  29.1*  PLT 227  --   --   --   --  253  LABPROT 23.1*  --   --   --   --  27.0*  INR 2.15*  --   --   --   --  2.65*  CREATININE 4.27*  --  4.10* 4.28*  --  4.64*  TROPONINI  --  <0.30  --   --  <0.30 <0.30    The CrCl is unknown because both a height and weight (above a minimum accepted value) are required for this calculation.  Assessment: 42 y.o. male presented to the hospital with SOB due to pulmonary edema. He continues on his home regimen of coumadin for history of PE. Today INR remains therapeutic at 2.65. Pt is anemic but CBC is stable. No bleeding noted.  Goal of Therapy:  INR 2-3   Plan:  1. Continue patients home coumadin regimen. He will receive 5mg  tonight 2. F/u AM INR  Salome Arnt, PharmD, BCPS Pager # [phone] 05/05/2012 8:51 AM

## 2012-05-05 NOTE — Progress Notes (Signed)
[facility]             Progress Note   Subjective: No complaints. SOB improved. Is and OS ?not documented correctly?  Objective: Vital signs in last 24 hours: Temp:  [98.2 F (36.8 C)-98.6 F (37 C)] 98.6 F (37 C) (04/11 1400) Pulse Rate:  [80-99] 98 (04/11 1400) Resp:  [19-24] 20 (04/11 1400) BP: (140-196)/(79-107) 140/81 mmHg (04/11 1400) SpO2:  [97 %-100 %] 98 % (04/11 1400) Weight change:  Last BM Date: 05/04/12  Intake/Output from previous day: 04/10 0701 - 04/11 0700 In: 184 [IV Piggyback:184] Out: -  Total I/O In: 480 [P.O.:480] Out: 750 [Urine:750]   Physical Exam: General: Alert, awake, oriented x3, in no acute distress. HEENT: No bruits, no goiter. Heart: Regular rate and rhythm, without murmurs, rubs, gallops. Lungs: Clear to auscultation bilaterally. Abdomen: Soft, nontender, nondistended, positive bowel sounds. Extremities: 2+ edema Neuro: Grossly intact, nonfocal.    Lab Results: Basic Metabolic Panel:  Recent Labs  05/04/12 1950 05/05/12 0419  NA 141 138  K 4.8 4.6  CL 111 109  CO2 17* 20  GLUCOSE 124* 107*  BUN 57* 61*  CREATININE 4.28* 4.64*  CALCIUM 6.7* 6.6*   Liver Function Tests:  Recent Labs  05/04/12 1300  AST 52*  ALT 95*  ALKPHOS 88  BILITOT 0.2*  PROT 7.5  ALBUMIN 2.7*   CBC:  Recent Labs  05/04/12 1300 05/04/12 1735 05/05/12 0419  WBC 5.8  --  5.8  HGB 9.4* 9.9* 9.5*  HCT 29.0* 29.0* 29.1*  MCV 80.8  --  79.3  PLT 227  --  253   Cardiac Enzymes:  Recent Labs  05/04/12 2300 05/05/12 0419 05/05/12 1000  TROPONINI <0.30 <0.30 <0.30   BNP:  Recent Labs  05/04/12 1300  PROBNP 15434.0*   CBG:  Recent Labs  05/04/12 1321 05/04/12 2302 05/05/12 0747 05/05/12 1116 05/05/12 1157  GLUCAP 96 111* 111* 45* 87   Thyroid Function Tests:  Recent Labs  05/05/12 0419  TSH 3.942   Coagulation:  Recent Labs  05/04/12 1300 05/05/12 0419  LABPROT 23.1* 27.0*  INR 2.15* 2.65*    Urinalysis:  Recent Labs  05/04/12 1230  COLORURINE YELLOW  LABSPEC 1.014  PHURINE 5.5  GLUCOSEU NEGATIVE  HGBUR MODERATE*  BILIRUBINUR NEGATIVE  KETONESUR NEGATIVE  PROTEINUR >300*  UROBILINOGEN 0.2  NITRITE NEGATIVE  LEUKOCYTESUR NEGATIVE    Studies/Results: Dg Chest 2 View  05/04/2012  *RADIOLOGY REPORT*  Clinical Data: Short of breath.  Chest pain.  CHEST - 2 VIEW  Comparison: 04/05/2012  Findings: Moderate cardiomegaly.  Vascular congestion.  Increasing airspace disease at the left base increased left pleural effusion. Small right pleural effusion.  IMPRESSION: Cardiomegaly and vascular congestion.  Bilateral pleural effusions left greater than right.  New airspace disease at the left base.   Original Report Authenticated By: Marybelle Killings, M.D.    Ir Fluoro Guide Cv Midline Picc Right  05/04/2012  *RADIOLOGY REPORT*  Clinical data: Fluid overload.  No peripheral IV access cannot be obtained.  RIGHT IJ CENTRAL VENOUS CATHETER PLACEMENT UNDER ULTRASOUND AND FLUOROSCOPIC GUIDANCE:  Technique and findings:  The procedure, risks (including but not limited to bleeding, infection, organ damage), benefits, and alternatives were explained to the patient.  Questions regarding the procedure were encouraged and answered.  The patient understands and consents to the procedure. Patency of the right IJ vein was confirmed with ultrasound with image documentation. An appropriate skin site was determined. Skin site was marked.  Region was prepped using maximum barrier technique including cap and mask, sterile gown, sterile gloves, large sterile sheet, and Chlorhexidine   as cutaneous antisepsis.  The region was infiltrated locally with 1% lidocaine.   Under real-time ultrasound guidance, the right IJ vein was accessed with a 21 gauge micropuncture needle; the needle tip within the vein was confirmed with ultrasound image documentation.  The  needle exchanged over a guidewire for peel-away sheath through  which a 20cm 5 French PowerPICC dual lumen catheter was advanced. This was positioned with the tip at the cavoatrial junction. Spot chest radiograph shows good positioning and no pneumothorax. Catheter was flushed and sutured externally with 0-Prolene sutures. Patient tolerated the procedure well, with no immediate complication.  IMPRESSION: 1. Technically successful right IJ double lumen power injectable central venous catheter placement.   Original Report Authenticated By: D. Wallace Going, MD     Medications: Scheduled Meds: . furosemide  120 mg Intravenous Q6H  . insulin aspart protamine-insulin aspart  50 Units Subcutaneous BID WC  . labetalol  200 mg Oral BID  . sodium bicarbonate  650 mg Oral BID  . sodium chloride  3 mL Intravenous Q12H  . sodium chloride  3 mL Intravenous Q12H  . [START ON 05/06/2012] warfarin  10 mg Oral Q T,Th,S,Su-1800  . warfarin  5 mg Oral Q M,W,F-1800  . Warfarin - Pharmacist Dosing Inpatient   Does not apply q1800   Continuous Infusions:  PRN Meds:.sodium chloride, acetaminophen, acetaminophen, albuterol, hydrALAZINE, ondansetron (ZOFRAN) IV, ondansetron, oxyCODONE, senna-docusate, sodium chloride, sodium chloride  Assessment/Plan:  Principal Problem:   Acute respiratory failure Active Problems:   HTN (hypertension)   Pulmonary embolism, bilateral   Pulmonary edema   CKD (chronic kidney disease) stage 5, GFR less than 15 ml/min   Hyperkalemia   Acute respiratory failure secondary to pulmonary edema  -Presumed secondary to chronic kidney disease. Approaching end-stage renal disease.  -Is symptomatically improved; however do not have objective data as it appears that Is and Os have not been recorded appropriately. -Per renal recs on high dose lasix 100 q6h -We are hoping to avoid hemodialysis yet.  -He currently does not have any oxygen requirement with good O2 sats.   Hyperkalemia  -K down to 4.6 today with kayexalate and insulin.  History of  pulmonary embolism  -Maintained on chronic anticoagulation with Coumadin.  -Pharmacy to monitor.  -INR is therapeutic.  Chronic kidney disease stage V  -Reaching end-stage renal disease.  -Already has AV fistula in place.  -Awaiting formal renal consultation.  Hypertension  -Improving. -Patient's blood pressures are quite elevated, suspect related to increased volume.  -Hope to see some improvement with Lasix.  -We'll place an order for when necessary hydralazine as well.   DVT prophylaxis  -He is already fully anticoagulated on Coumadin for his pulmonary embolism.      Time spent coordinating care: 35 minutes.   LOS: 1 day   [facility] Pager: [phone] 05/05/2012, 3:27 PM

## 2012-05-05 NOTE — Consult Note (Signed)
Requesting Physician:  Dr. Jerilee Hoh  Reason for Consult:  CKD5, volume overload HPI: The patient is a 42 y.o. year-old AAM followed by Dr. Justin Mend, with known advanced CKD (creatinine 4.24 on 05/13/12), s/p radiocephalic AVF [id number] by Dr. Scot Dock) and a background history of diabetes, hypertension, pulmonary embolus on coumadin, anemia on Aranesp and secondary HPT.  He was admitted 4/10 after presenting with a 2 day history of shortness of breath, significant lower extremity edema and creatinine around 4.2. He was not on diuretics as an outpatient, but has received IV Lasix 120 Q6H  since admission with marked improvement in symptoms.  He is no longer requiring oxygen and indicates can ambulate without SOB.  He has had no uremic symptoms.  Of note he has been poorly compliant with keeping his appts with Dr. Justin Mend, and cancelled 2 appts since January.     Creatinine, Ser  Date/Time Value Range Status  05/05/2012  4:19 AM 4.64* 0.50 - 1.35 mg/dL Final  05/04/2012  7:50 PM 4.28* 0.50 - 1.35 mg/dL Final  05/04/2012  5:35 PM 4.10* 0.50 - 1.35 mg/dL Final  05/04/2012  1:00 PM 4.27* 0.50 - 1.35 mg/dL Final  06/09/2011  3:39 PM 3.18* 0.50 - 1.35 mg/dL Final  09/24/2010  3:47 PM 2.9* 0.4 - 1.5 mg/dL Final  12/27/2009 12:11 AM 3.3* 0.4 - 1.5 mg/dL Final  12/26/2009  8:27 PM 3.6* 0.4 - 1.5 mg/dL Final  01/02/2008  9:27 AM 1.5  0.4 - 1.5 mg/dL Final    Past Medical History:  Past Medical History  Diagnosis Date  . Eczema   . Morbid obesity   . Gout   . HTN (hypertension)   . Dyslipidemia   . Pulmonary embolism     3  in  lungs  at  one time...  . Abscess     great toe  . Pancreatitis   . Renal insufficiency   . Arthritis   . Depression   . Diabetes mellitus   . Anemia   . Shortness of breath     ?? chest  cold he has now    Past Surgical History:  Past Surgical History  Procedure Laterality Date  . None    . Av fistula placement Left 04/07/2012    Procedure: ARTERIOVENOUS (AV) FISTULA  CREATION;  Surgeon: Angelia Mould, MD;  Location: [facility] OR;  Service: Vascular;  Laterality: Left;    Family History:  Family History  Problem Relation Age of Onset  . Stroke Mother   . Diabetes Mother   . Hypertension Mother   . Cancer    . Coronary artery disease    . Hypertension Father   . Colon cancer Maternal Grandmother 72  . Hypertension Brother    Social History:  reports that he has never smoked. He has never used smokeless tobacco. He reports that he drinks about 0.6 ounces of alcohol per week. He reports that he does not use illicit drugs.  Allergies: No Known Allergies  Home medications: Prior to Admission medications   Medication Sig Start Date End Date Taking? Authorizing Provider  albuterol (PROVENTIL HFA;VENTOLIN HFA) 108 (90 BASE) MCG/ACT inhaler Inhale 1 puff into the lungs every 6 (six) hours as needed for wheezing.   Yes Historical Provider, MD  insulin aspart protamine-insulin aspart (NOVOLOG 70/30) (70-30) 100 UNIT/ML injection Inject into the skin 2 (two) times daily with a meal. 50 units twice a day    Yes Historical Provider, MD  labetalol (NORMODYNE) 200 MG tablet  Take 200 mg by mouth 2 (two) times daily.   Yes Historical Provider, MD  oxyCODONE-acetaminophen (PERCOCET/ROXICET) 5-325 MG per tablet Take 1-2 tablets by mouth every 4 (four) hours as needed for pain.   Yes Historical Provider, MD  sodium bicarbonate 650 MG tablet Take 650 mg by mouth 2 (two) times daily.   Yes Historical Provider, MD  warfarin (COUMADIN) 10 MG tablet Take 10 mg by mouth every Tuesday, Thursday, Saturday, and Sunday at 6 PM.   Yes Historical Provider, MD  warfarin (COUMADIN) 5 MG tablet Take 5 mg by mouth every Monday, Wednesday, and Friday.   Yes Historical Provider, MD    Inpatient medications: . furosemide  120 mg Intravenous Q6H  . insulin aspart protamine-insulin aspart  50 Units Subcutaneous BID WC  . labetalol  200 mg Oral BID  . sodium bicarbonate  650 mg Oral BID   . sodium chloride  3 mL Intravenous Q12H  . sodium chloride  3 mL Intravenous Q12H  . [START ON 05/06/2012] warfarin  10 mg Oral Q T,Th,S,Su-1800  . warfarin  5 mg Oral Q M,W,F-1800  . Warfarin - Pharmacist Dosing Inpatient   Does not apply q1800    Review of Systems Gen:  Denies headache, fever, chills, sweats.  No weight loss. HEENT:  No visual change, sore throat, difficulty swallowing. Resp:  Several days of progressive dyspnea as well as "months" of LE swelling with "legs as big as tree trunks" Cardiac:  No chest pain, orthopnea, PND.  GI:   Denies abdominal pain.   No nausea, vomiting, diarrhea.  No constipation. GU:  Denies difficulty or change in voiding.  No change in urine color.     MS:  Denies joint pain or swelling.   Derm:  Denies skin rash or itching.  No chronic skin conditions.Has tattoos Neuro:   Denies focal weakness, memory problems, hx stroke or TIA.   Psych:  Denies symptoms of depression of anxiety.  No hallucination.    Physical Exam:  Blood pressure 140/81, pulse 98, temperature 98.6 F (37 C), temperature source Oral, resp. rate 20, height 6\' 1"  (1.854 m), SpO2 98.00%.  Gen: MO AAM NAD Not wearing oxygen adn quite comfortable No skin rash or cyanosisyanosis Neck: no JVD but neck thick and hard to vis neck veins Right IJ PICC in place Chest: Grossly clear Heart: regular, no rub or gallop Abdomen: soft, obese and not tender Ext: 3+ edema of both LE's Left radiocephalic AVF with well healed incision, + bruit appears deep Neuro: alert, Ox3, no focal deficit Heme/Lymph: no bruising or LAN  Labs: Basic Metabolic Panel:  Recent Labs Lab 05/04/12 1300 05/04/12 1735 05/04/12 1950 05/05/12 0419  NA 137 142 141 138  K 5.9* 4.6 4.8 4.6  CL 110 116* 111 109  CO2 17*  --  17* 20  GLUCOSE 109* 181* 124* 107*  BUN 59* 53* 57* 61*  CREATININE 4.27* 4.10* 4.28* 4.64*  CALCIUM 6.3*  --  6.7* 6.6*   Liver Function Tests:  Recent Labs Lab 05/04/12 1300   AST 52*  ALT 95*  ALKPHOS 88  BILITOT 0.2*  PROT 7.5  ALBUMIN 2.7*  CBC:  Recent Labs Lab 05/04/12 1300 05/04/12 1735 05/05/12 0419  WBC 5.8  --  5.8  HGB 9.4* 9.9* 9.5*  HCT 29.0* 29.0* 29.1*  MCV 80.8  --  79.3  PLT 227  --  253   Recent Labs Lab 05/04/12 1725 05/04/12 2300 05/05/12 0419 05/05/12 1000  TROPONINI <0.30 <0.30 <0.30 <0.30   CBG:  Recent Labs Lab 05/04/12 1321 05/04/12 2302 05/05/12 0747 05/05/12 1116 05/05/12 1157  GLUCAP 96 111* 111* 45* 87   Lab Results  Component Value Date   INR 2.65* 05/05/2012   INR 2.15* 05/04/2012   INR 1.56* 04/07/2012    Iron Studies: No results found for this basename: IRON, TIBC, TRANSFERRIN, FERRITIN,  in the last 168 hours  Xrays/Other Studies: Dg Chest 2 View  05/04/2012  *RADIOLOGY REPORT*  Clinical Data: Short of breath.  Chest pain.  CHEST - 2 VIEW  Comparison: 04/05/2012  Findings: Moderate cardiomegaly.  Vascular congestion.  Increasing airspace disease at the left base increased left pleural effusion. Small right pleural effusion.  IMPRESSION: Cardiomegaly and vascular congestion.  Bilateral pleural effusions left greater than right.  New airspace disease at the left base.   Original Report Authenticated By: Marybelle Killings, M.D.    Ir Fluoro Guide Cv Midline Picc Right  05/04/2012  *RADIOLOGY REPORT*  Clinical data: Fluid overload.  No peripheral IV access cannot be obtained.  RIGHT IJ CENTRAL VENOUS CATHETER PLACEMENT UNDER ULTRASOUND AND FLUOROSCOPIC GUIDANCE:  Technique and findings:  The procedure, risks (including but not limited to bleeding, infection, organ damage), benefits, and alternatives were explained to the patient.  Questions regarding the procedure were encouraged and answered.  The patient understands and consents to the procedure. Patency of the right IJ vein was confirmed with ultrasound with image documentation. An appropriate skin site was determined. Skin site was marked. Region was prepped  using maximum barrier technique including cap and mask, sterile gown, sterile gloves, large sterile sheet, and Chlorhexidine   as cutaneous antisepsis.  The region was infiltrated locally with 1% lidocaine.   Under real-time ultrasound guidance, the right IJ vein was accessed with a 21 gauge micropuncture needle; the needle tip within the vein was confirmed with ultrasound image documentation.  The  needle exchanged over a guidewire for peel-away sheath through which a 20cm 5 French PowerPICC dual lumen catheter was advanced. This was positioned with the tip at the cavoatrial junction. Spot chest radiograph shows good positioning and no pneumothorax. Catheter was flushed and sutured externally with 0-Prolene sutures. Patient tolerated the procedure well, with no immediate complication.  IMPRESSION: 1. Technically successful right IJ double lumen power injectable central venous catheter placement.   Original Report Authenticated By: D. Wallace Going, MD     Impression/Plan 42 yo AAM with DM, HTN, CKD 4 (baseline creatinine 4.24) who presents with dyspnea, most likely related to volume overload in the absence of outpt diuretics PTA  CKD5 Probable diabetic nephropathy Followed by Dr. Justin Mend AVF in place but not matured No dialysis indications Volume overload responding to diuretics Should be able to change to po diuretic regimen - would use 160 po BID F/U has been scheduled with Dr. Justin Mend for 05/19/12 at 3:45 PM and it will be mandatory that patient stop cancelling and rescheduling appts   Secondary hyperpara PTH 208 01/2012 Pt supposed to be on calcitriol 0.25 QD Not noted on home meds Will resume Check phos and add calcium based binders if high  HTN Meds  Anemia Supposed to be on weekly aranesp at [facility] Does not appear to have been to [facility] since 01/2012 This can be readdressed at outpt appt Will give Aranesp 60 this admission  H/o pulm embolus on coumadin INR therapeutic    Jamal Maes,   MD [place] pager 05/05/2012, 3:31 PM

## 2012-05-06 LAB — RENAL FUNCTION PANEL
Albumin: 2.5 g/dL — ABNORMAL LOW (ref 3.5–5.2)
CO2: 19 mEq/L (ref 19–32)
Calcium: 6.5 mg/dL — ABNORMAL LOW (ref 8.4–10.5)
GFR calc Af Amer: 15 mL/min — ABNORMAL LOW (ref >90)
GFR calc non Af Amer: 13 mL/min — ABNORMAL LOW (ref >90)
Phosphorus: 5.9 mg/dL — ABNORMAL HIGH (ref 2.3–4.6)
Sodium: 139 mEq/L (ref 135–145)

## 2012-05-06 LAB — GLUCOSE, CAPILLARY
Glucose-Capillary: 97 mg/dL (ref 70–99)
Glucose-Capillary: 97 mg/dL (ref 70–99)

## 2012-05-06 LAB — PROTIME-INR
INR: 2.38 — ABNORMAL HIGH (ref 0.00–1.49)
Prothrombin Time: 24.9 seconds — ABNORMAL HIGH (ref 11.6–15.2)

## 2012-05-06 MED ORDER — CALCIUM ACETATE 667 MG PO CAPS
667.0000 mg | ORAL_CAPSULE | Freq: Three times a day (TID) | ORAL | Status: DC
  Filled 2012-05-06 (×2): qty 1

## 2012-05-06 MED ORDER — INSULIN ASPART PROT & ASPART (70-30 MIX) 100 UNIT/ML ~~LOC~~ SUSP
25.0000 [IU] | Freq: Two times a day (BID) | SUBCUTANEOUS | Status: DC
  Filled 2012-05-06: qty 10

## 2012-05-06 MED ORDER — FUROSEMIDE 80 MG PO TABS: 160.0000 mg | ORAL_TABLET | Freq: Two times a day (BID) | ORAL | Status: DC

## 2012-05-06 MED ORDER — INSULIN ASPART PROT & ASPART (70-30 MIX) 100 UNIT/ML ~~LOC~~ SUSP: 25.0000 [IU] | Freq: Two times a day (BID) | SUBCUTANEOUS | Status: DC

## 2012-05-06 MED ORDER — CALCITRIOL 0.25 MCG PO CAPS: 0.2500 ug | ORAL_CAPSULE | Freq: Every day | ORAL | Status: DC

## 2012-05-06 NOTE — Progress Notes (Signed)
[facility] ROUNDING NOTE:  ASSESSMENT/RECOMMENDATIONS  42 yo AAM with DM, HTN, CKD 4 (baseline creatinine 4.24) who presents with dyspnea, most likely related to volume overload in the absence of outpt diuretics PTA   CKD5  Probable diabetic nephropathy  Followed by Dr. Justin Mend  AVF in place but not matured  No dialysis indications  Volume overload responding to diuretics and creatinine has risen some as expected Changed to po diuretic regimen 160 po BID  I am OK with discharge home on po lasix F/U has been scheduled with Dr. Justin Mend for 05/19/12 at 3:45 PM and it will be mandatory that patient stop cancelling and rescheduling appts   Secondary hyperpara  PTH 208 01/2012  Pt supposed to be on calcitriol 0.25 QD but had "run out" so not noted on home meds  Resumed Needs to start binders - PhosLo 667 1 tid with meals will need Rx at discharge   HTN  Meds   Anemia  Supposed to be on weekly aranesp at [facility]  Does not appear to have been to [facility] since 01/2012  This can be readdressed at outpt appt  Gave Aranesp 60 this admission   H/o pulm embolus on coumadin  INR therapeutic   Subjective:  "I feel great" No shortness of breath I/O records still look incomplete  Objective:   05/05/12 2147 121.927 kg (268 lb) Vital signs in last 24 hours: Filed Vitals:   05/05/12 1953 05/05/12 2147 05/06/12 0443 05/06/12 1000  BP: 170/92  165/88 129/78  Pulse: 82  82 80  Temp: 97.7 F (36.5 C)  98.6 F (37 C) 98 F (36.7 C)  TempSrc: Oral  Oral Oral  Resp: 20  19 20   Height:      Weight:  121.927 kg (268 lb 12.8 oz)    SpO2: 99%  100% 100%   Weight change:   Intake/Output Summary (Last 24 hours) at 05/06/12 1305 Last data filed at 05/06/12 0900  Gross per 24 hour  Intake    720 ml  Output    550 ml  Net    170 ml    Physical Exam:  Blood pressure 129/78, pulse 80, temperature 98 F (36.7 C), temperature source Oral, resp. rate 20, height 6\' 1"  (1.854 m), weight 121.927  kg (268 lb 12.8 oz), SpO2 100.00%. Overweight BM  NAD Lungs grossly clear No rub Left AVF good bruit and thrill 2+ edema bilateral LE's  Recent Labs Lab 05/04/12 1300 05/04/12 1735 05/04/12 1950 05/05/12 0419 05/06/12 0500  NA 137 142 141 138 139  K 5.9* 4.6 4.8 4.6 4.4  CL 110 116* 111 109 105  CO2 17*  --  17* 20 19  GLUCOSE 109* 181* 124* 107* 88  BUN 59* 53* 57* 61* 70*  CREATININE 4.27* 4.10* 4.28* 4.64* 5.02*  CALCIUM 6.3*  --  6.7* 6.6* 6.5*  PHOS  --   --   --   --  5.9*   Recent Labs Lab 05/04/12 1300 05/06/12 0500  AST 52*  --   ALT 95*  --   ALKPHOS 88  --   BILITOT 0.2*  --   PROT 7.5  --   ALBUMIN 2.7* 2.5*   Recent Labs Lab 05/04/12 1300 05/04/12 1735 05/05/12 0419  WBC 5.8  --  5.8  HGB 9.4* 9.9* 9.5*  HCT 29.0* 29.0* 29.1*  MCV 80.8  --  79.3  PLT 227  --  253    Recent Labs Lab 05/04/12  1725 05/04/12 2300 05/05/12 0419 05/05/12 1000  TROPONINI <0.30 <0.30 <0.30 <0.30     Recent Labs Lab 05/05/12 1707 05/05/12 2146 05/06/12 0731 05/06/12 1144 05/06/12 1213  GLUCAP 81 76 97 57* 97   Studies/Results: Ir US Guide Vasc Access Right  05/05/2012  *RADIOLOGY REPORT*  Clinical data: Fluid overload.  No peripheral IV access cannot be obtained.  RIGHT IJ CENTRAL VENOUS CATHETER PLACEMENT UNDER ULTRASOUND AND FLUOROSCOPIC GUIDANCE:  Technique and findings:  The procedure, risks (including but not limited to bleeding, infection, organ damage), benefits, and alternatives were explained to the patient.  Questions regarding the procedure were encouraged and answered.  The patient understands and consents to the procedure. Patency of the right IJ vein was confirmed with ultrasound with image documentation. An appropriate skin site was determined. Skin site was marked. Region was prepped using maximum barrier technique including cap and mask, sterile gown, sterile gloves, large sterile sheet, and Chlorhexidine   as cutaneous antisepsis.  The region  was infiltrated locally with 1% lidocaine.   Under real-time ultrasound guidance, the right IJ vein was accessed with a 21 gauge micropuncture needle; the needle tip within the vein was confirmed with ultrasound image documentation.  The  needle exchanged over a guidewire for peel-away sheath through which a 20cm 5 French PowerPICC dual lumen catheter was advanced. This was positioned with the tip at the cavoatrial junction. Spot chest radiograph shows good positioning and no pneumothorax. Catheter was flushed and sutured externally with 0-Prolene sutures. Patient tolerated the procedure well, with no immediate complication.  IMPRESSION: 1. Technically successful right IJ double lumen power injectable central venous catheter placement.   Original Report Authenticated By: D. Wallace Going, MD    Ir Fluoro Guide Cv Midline Picc Right  05/04/2012  *RADIOLOGY REPORT*  Clinical data: Fluid overload.  No peripheral IV access cannot be obtained.  RIGHT IJ CENTRAL VENOUS CATHETER PLACEMENT UNDER ULTRASOUND AND FLUOROSCOPIC GUIDANCE:  Technique and findings:  The procedure, risks (including but not limited to bleeding, infection, organ damage), benefits, and alternatives were explained to the patient.  Questions regarding the procedure were encouraged and answered.  The patient understands and consents to the procedure. Patency of the right IJ vein was confirmed with ultrasound with image documentation. An appropriate skin site was determined. Skin site was marked. Region was prepped using maximum barrier technique including cap and mask, sterile gown, sterile gloves, large sterile sheet, and Chlorhexidine   as cutaneous antisepsis.  The region was infiltrated locally with 1% lidocaine.   Under real-time ultrasound guidance, the right IJ vein was accessed with a 21 gauge micropuncture needle; the needle tip within the vein was confirmed with ultrasound image documentation.  The  needle exchanged over a guidewire for  peel-away sheath through which a 20cm 5 French PowerPICC dual lumen catheter was advanced. This was positioned with the tip at the cavoatrial junction. Spot chest radiograph shows good positioning and no pneumothorax. Catheter was flushed and sutured externally with 0-Prolene sutures. Patient tolerated the procedure well, with no immediate complication.  IMPRESSION: 1. Technically successful right IJ double lumen power injectable central venous catheter placement.   Original Report Authenticated By: D. Wallace Going, MD       . calcitRIOL  0.25 mcg Oral Daily  . darbepoetin (ARANESP) injection - NON-DIALYSIS  60 mcg Subcutaneous Q Fri-1800  . furosemide  160 mg Oral BID  . insulin aspart protamine-insulin aspart  25 Units Subcutaneous BID WC  . labetalol  200 mg  Oral BID  . sodium bicarbonate  650 mg Oral BID  . sodium chloride  3 mL Intravenous Q12H  . sodium chloride  3 mL Intravenous Q12H  . warfarin  10 mg Oral Q T,Th,S,Su-1800  . warfarin  5 mg Oral Q M,W,F-1800  . Warfarin - Pharmacist Dosing Inpatient   Does not apply q1800     I  have reviewed scheduled and prn medications.   Jamal Maes, MD [place] Pager 05/06/2012, 1:05 PM

## 2012-05-06 NOTE — Discharge Summary (Signed)
Physician Discharge Summary  Patient ID: Steven Logan MRN: [id number] DOB/AGE: 08-31-1970 43 y.o.  Admit date: 05/04/2012 Discharge date: 05/06/2012  Primary Care Physician:  Tivis Ringer, MD   Discharge Diagnoses:    Principal Problem:   Acute respiratory failure Active Problems:   HTN (hypertension)   Pulmonary embolism, bilateral   Pulmonary edema   CKD (chronic kidney disease) stage 5, GFR less than 15 ml/min   Hyperkalemia      Medication List    TAKE these medications       albuterol 108 (90 BASE) MCG/ACT inhaler  Commonly known as:  PROVENTIL HFA;VENTOLIN HFA  Inhale 1 puff into the lungs every 6 (six) hours as needed for wheezing.     calcitRIOL 0.25 MCG capsule  Commonly known as:  ROCALTROL  Take 1 capsule (0.25 mcg total) by mouth daily.     furosemide 80 MG tablet  Commonly known as:  LASIX  Take 2 tablets (160 mg total) by mouth 2 (two) times daily.     insulin aspart protamine-insulin aspart (70-30) 100 UNIT/ML injection  Commonly known as:  NOVOLOG 70/30  Inject 25 Units into the skin 2 (two) times daily with a meal.     labetalol 200 MG tablet  Commonly known as:  NORMODYNE  Take 200 mg by mouth 2 (two) times daily.     oxyCODONE-acetaminophen 5-325 MG per tablet  Commonly known as:  PERCOCET/ROXICET  Take 1-2 tablets by mouth every 4 (four) hours as needed for pain.     sodium bicarbonate 650 MG tablet  Take 650 mg by mouth 2 (two) times daily.     warfarin 5 MG tablet  Commonly known as:  COUMADIN  Take 5 mg by mouth every Monday, Wednesday, and Friday.     warfarin 10 MG tablet  Commonly known as:  COUMADIN  Take 10 mg by mouth every Tuesday, Thursday, Saturday, and Sunday at 6 PM.         Disposition and Follow-up:  Will be discharged home today in stable and improved condition. An appointment has been set up for him with Dr. Justin Mend on April 25 at 3:45 PM.  Consults:  Nephrology, Dr. Lorrene Reid   Significant Diagnostic  Studies:  Ir US Guide Vasc Access Right  05/05/2012  *RADIOLOGY REPORT*  Clinical data: Fluid overload.  No peripheral IV access cannot be obtained.  RIGHT IJ CENTRAL VENOUS CATHETER PLACEMENT UNDER ULTRASOUND AND FLUOROSCOPIC GUIDANCE:  Technique and findings:  The procedure, risks (including but not limited to bleeding, infection, organ damage), benefits, and alternatives were explained to the patient.  Questions regarding the procedure were encouraged and answered.  The patient understands and consents to the procedure. Patency of the right IJ vein was confirmed with ultrasound with image documentation. An appropriate skin site was determined. Skin site was marked. Region was prepped using maximum barrier technique including cap and mask, sterile gown, sterile gloves, large sterile sheet, and Chlorhexidine   as cutaneous antisepsis.  The region was infiltrated locally with 1% lidocaine.   Under real-time ultrasound guidance, the right IJ vein was accessed with a 21 gauge micropuncture needle; the needle tip within the vein was confirmed with ultrasound image documentation.  The  needle exchanged over a guidewire for peel-away sheath through which a 20cm 5 French PowerPICC dual lumen catheter was advanced. This was positioned with the tip at the cavoatrial junction. Spot chest radiograph shows good positioning and no pneumothorax. Catheter was flushed and sutured externally with  0-Prolene sutures. Patient tolerated the procedure well, with no immediate complication.  IMPRESSION: 1. Technically successful right IJ double lumen power injectable central venous catheter placement.   Original Report Authenticated By: D. Wallace Going, MD    Ir Fluoro Guide Cv Midline Picc Right  05/04/2012  *RADIOLOGY REPORT*  Clinical data: Fluid overload.  No peripheral IV access cannot be obtained.  RIGHT IJ CENTRAL VENOUS CATHETER PLACEMENT UNDER ULTRASOUND AND FLUOROSCOPIC GUIDANCE:  Technique and findings:  The procedure,  risks (including but not limited to bleeding, infection, organ damage), benefits, and alternatives were explained to the patient.  Questions regarding the procedure were encouraged and answered.  The patient understands and consents to the procedure. Patency of the right IJ vein was confirmed with ultrasound with image documentation. An appropriate skin site was determined. Skin site was marked. Region was prepped using maximum barrier technique including cap and mask, sterile gown, sterile gloves, large sterile sheet, and Chlorhexidine   as cutaneous antisepsis.  The region was infiltrated locally with 1% lidocaine.   Under real-time ultrasound guidance, the right IJ vein was accessed with a 21 gauge micropuncture needle; the needle tip within the vein was confirmed with ultrasound image documentation.  The  needle exchanged over a guidewire for peel-away sheath through which a 20cm 5 French PowerPICC dual lumen catheter was advanced. This was positioned with the tip at the cavoatrial junction. Spot chest radiograph shows good positioning and no pneumothorax. Catheter was flushed and sutured externally with 0-Prolene sutures. Patient tolerated the procedure well, with no immediate complication.  IMPRESSION: 1. Technically successful right IJ double lumen power injectable central venous catheter placement.   Original Report Authenticated By: D. Wallace Going, MD     Brief H and P: For complete details please refer to admission H and P, but in brief patient is a pleasant 42 year old [place] American man with history of stage V chronic kidney disease nearing dialysis to the point where he already has an AV fistula in place, hypertension, pulmonary embolism chronically anticoagulated on Coumadin. 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 In the hospital he has a chest x-ray that is consistent with pulmonary edema, he has a potassium of 5.9, a BUN of 53 and a creatinine of 4.10. He denies any fevers, chills, cough. We have been asked to admit him for further evaluation and management.      Hospital Course:  Principal Problem:   Acute respiratory failure Active Problems:   HTN (hypertension)   Pulmonary embolism, bilateral   Pulmonary edema   CKD (chronic kidney disease) stage 5, GFR less than 15 ml/min   Hyperkalemia   Acute respiratory failure secondary to pulmonary edema -Secondary to stage V chronic kidney disease without outpatient diuretics.  -Per renal's recommendation has been started on 160 mg of Lasix twice daily. -Patient is a breath has resolved and he does not have any oxygen requirements.  Hyperkalemia  -Resolved with Kayexalate and insulin treatment.  History of pulmonary embolism  -Maintained on chronic anticoagulation with Coumadin.  -INR is therapeutic at 2.38 at time of discharge.  Chronic kidney disease stage V  -Reaching end-stage renal disease.  -Already has AV fistula in place.   Hypertension  -Improving.  -Patient's blood pressures were quite  elevated, suspect related to increased volume.   Hypoglycemia in insulin-dependent diabetes -Despite being on half of his home regimen of 70/30, he has experienced several hypoglycemic episodes while in the hospital. -Patient insists that he is compliant with his 50 units twice a day of insulin 70/30 at home, although he admits to eating healthier when he is in the hospital. -I will discharge him on 25 units twice a day, advised to follow up with his primary care physician within the next week to further assess his diabetic requirements.   Time spent on Discharge: Greater than 30 minutes  Signed: Lelon Frohlich [facility] Pager: [phone] 05/06/2012, 1:38 PM

## 2012-05-06 NOTE — Progress Notes (Signed)
Hypoglycemic Event  CBG: 57  Treatment: 8 oz of ginger ale  Symptoms: weakness   Follow-up CBG: Time:1220 CBG Result:97  Possible Reasons for Event: unknown Comments/MD notified:yes    Keiry Kowal, Zenon Mayo  Remember to initiate Hypoglycemia Order Set & complete

## 2012-05-06 NOTE — Progress Notes (Signed)
ANTICOAGULATION CONSULT NOTE - Follow-up  Pharmacy Consult for Coumadin Indication: h/o PE  No Known Allergies  Patient Measurements: Height: 6\' 1"  (185.4 cm) Weight: 268 lb 12.8 oz (121.927 kg) IBW/kg (Calculated) : 79.9  Vital Signs: Temp: 98.6 F (37 C) (04/12 0443) Temp src: Oral (04/12 0443) BP: 165/88 mmHg (04/12 0443) Pulse Rate: 82 (04/12 0443)  Labs:  Recent Labs  05/04/12 1300  05/04/12 1735 05/04/12 1950 05/04/12 2300 05/05/12 0419 05/05/12 1000 05/06/12 0500  HGB 9.4*  --  9.9*  --   --  9.5*  --   --   HCT 29.0*  --  29.0*  --   --  29.1*  --   --   PLT 227  --   --   --   --  253  --   --   LABPROT 23.1*  --   --   --   --  27.0*  --  24.9*  INR 2.15*  --   --   --   --  2.65*  --  2.38*  CREATININE 4.27*  --  4.10* 4.28*  --  4.64*  --  5.02*  TROPONINI  --   < >  --   --  <0.30 <0.30 <0.30  --   < > = values in this interval not displayed.  Estimated Creatinine Clearance: 26.2 ml/min (by C-G formula based on Cr of 5.02).  Assessment: 42 y.o. male presented to the hospital with SOB due to pulmonary edema. He continues on his home regimen of coumadin for history of PE. Today INR remains therapeutic at 2.38. Pt is anemic but CBC is stable. No bleeding noted. PTA coumadin regimen: 5mg  every M-W-F, 10 mg T-Th-Sat  Goal of Therapy:  INR 2-3   Plan:  1. Continue patients home coumadin regimen. He will receive 10mg  tonight 2. F/u AM INR  Bola A. [place], [place] Pharmacist [phone] Phone [phone] 05/06/2012 8:42 AM

## 2012-05-08 NOTE — ED Provider Notes (Signed)
See my note  Shaune Pollack, MD 05/08/12 [phone]

## 2012-05-11 ENCOUNTER — Encounter (HOSPITAL_COMMUNITY)
Admission: RE | Admit: 2012-05-11 | Discharge: 2012-05-11 | Disposition: A | Discharge status: Home / Self Care | Payer: BC Managed Care – PPO | Source: Ambulatory Visit | Attending: Nephrology | Admitting: Nephrology

## 2012-05-11 MED ORDER — DARBEPOETIN ALFA-POLYSORBATE 40 MCG/0.4ML IJ SOLN
40.0000 ug | INTRAMUSCULAR | Status: DC
  Administered 2012-05-11: 40 ug via SUBCUTANEOUS

## 2012-05-11 MED ORDER — DARBEPOETIN ALFA-POLYSORBATE 40 MCG/0.4ML IJ SOLN
INTRAMUSCULAR | Status: AC
  Filled 2012-05-11: qty 0.4

## 2012-05-18 ENCOUNTER — Encounter (HOSPITAL_COMMUNITY)
Admission: RE | Admit: 2012-05-18 | Discharge: 2012-05-18 | Disposition: A | Discharge status: Home / Self Care | Payer: BC Managed Care – PPO | Source: Ambulatory Visit | Attending: Nephrology | Admitting: Nephrology

## 2012-05-18 LAB — FERRITIN: Ferritin: 456 ng/mL — ABNORMAL HIGH (ref 22–322)

## 2012-05-18 LAB — POCT HEMOGLOBIN-HEMACUE: Hemoglobin: 9.5 g/dL — ABNORMAL LOW (ref 13.0–17.0)

## 2012-05-18 MED ORDER — DARBEPOETIN ALFA-POLYSORBATE 40 MCG/0.4ML IJ SOLN
40.0000 ug | INTRAMUSCULAR | Status: DC
  Administered 2012-05-18: 40 ug via SUBCUTANEOUS

## 2012-05-18 MED ORDER — DARBEPOETIN ALFA-POLYSORBATE 40 MCG/0.4ML IJ SOLN
INTRAMUSCULAR | Status: AC
  Filled 2012-05-18: qty 0.4

## 2012-05-23 ENCOUNTER — Encounter: Payer: Self-pay | Admitting: Vascular Surgery

## 2012-05-24 ENCOUNTER — Ambulatory Visit: Payer: BC Managed Care – PPO | Admitting: Vascular Surgery

## 2012-05-25 ENCOUNTER — Encounter (HOSPITAL_COMMUNITY)
Admission: RE | Admit: 2012-05-25 | Discharge: 2012-05-25 | Disposition: A | Discharge status: Home / Self Care | Payer: BC Managed Care – PPO | Source: Ambulatory Visit | Attending: Nephrology | Admitting: Nephrology

## 2012-05-25 DIAGNOSIS — D638 Anemia in other chronic diseases classified elsewhere: Secondary | ICD-10-CM | POA: Insufficient documentation

## 2012-05-25 DIAGNOSIS — N184 Chronic kidney disease, stage 4 (severe): Secondary | ICD-10-CM | POA: Insufficient documentation

## 2012-05-25 MED ORDER — DARBEPOETIN ALFA-POLYSORBATE 40 MCG/0.4ML IJ SOLN
INTRAMUSCULAR | Status: AC
  Filled 2012-05-25: qty 0.4

## 2012-05-25 MED ORDER — DARBEPOETIN ALFA-POLYSORBATE 40 MCG/0.4ML IJ SOLN
40.0000 ug | INTRAMUSCULAR | Status: DC
  Administered 2012-05-25: 40 ug via SUBCUTANEOUS

## 2012-05-31 ENCOUNTER — Encounter (HOSPITAL_COMMUNITY): Payer: BC Managed Care – PPO

## 2012-06-01 ENCOUNTER — Encounter (HOSPITAL_COMMUNITY)
Admission: RE | Admit: 2012-06-01 | Discharge: 2012-06-01 | Disposition: A | Discharge status: Home / Self Care | Payer: BC Managed Care – PPO | Source: Ambulatory Visit | Attending: Nephrology | Admitting: Nephrology

## 2012-06-01 LAB — POCT HEMOGLOBIN-HEMACUE: Hemoglobin: 10.3 g/dL — ABNORMAL LOW (ref 13.0–17.0)

## 2012-06-01 MED ORDER — DARBEPOETIN ALFA-POLYSORBATE 25 MCG/0.42ML IJ SOLN
INTRAMUSCULAR | Status: AC
  Filled 2012-06-01: qty 0.42

## 2012-06-01 MED ORDER — DARBEPOETIN ALFA-POLYSORBATE 40 MCG/0.4ML IJ SOLN
40.0000 ug | INTRAMUSCULAR | Status: DC
  Administered 2012-06-01: 40 ug via SUBCUTANEOUS

## 2012-06-08 ENCOUNTER — Encounter (HOSPITAL_COMMUNITY)
Admission: RE | Admit: 2012-06-08 | Discharge: 2012-06-08 | Disposition: A | Discharge status: Home / Self Care | Payer: BC Managed Care – PPO | Source: Ambulatory Visit | Attending: Nephrology | Admitting: Nephrology

## 2012-06-08 MED ORDER — DARBEPOETIN ALFA-POLYSORBATE 40 MCG/0.4ML IJ SOLN
40.0000 ug | INTRAMUSCULAR | Status: DC
  Administered 2012-06-08: 40 ug via SUBCUTANEOUS

## 2012-06-08 MED ORDER — DARBEPOETIN ALFA-POLYSORBATE 40 MCG/0.4ML IJ SOLN
INTRAMUSCULAR | Status: AC
  Filled 2012-06-08: qty 0.4

## 2012-06-14 ENCOUNTER — Encounter (HOSPITAL_COMMUNITY): Payer: BC Managed Care – PPO

## 2012-06-15 ENCOUNTER — Encounter (HOSPITAL_COMMUNITY)
Admission: RE | Admit: 2012-06-15 | Discharge: 2012-06-15 | Disposition: A | Discharge status: Home / Self Care | Payer: BC Managed Care – PPO | Source: Ambulatory Visit | Attending: Nephrology | Admitting: Nephrology

## 2012-06-15 MED ORDER — DARBEPOETIN ALFA-POLYSORBATE 40 MCG/0.4ML IJ SOLN
INTRAMUSCULAR | Status: AC
  Administered 2012-06-15: 40 ug via SUBCUTANEOUS
  Filled 2012-06-15: qty 0.4

## 2012-06-15 MED ORDER — DARBEPOETIN ALFA-POLYSORBATE 40 MCG/0.4ML IJ SOLN: 40.0000 ug | INTRAMUSCULAR | Status: DC

## 2012-06-16 LAB — IRON AND TIBC: Iron: 41 ug/dL — ABNORMAL LOW (ref 42–135)

## 2012-06-16 LAB — FERRITIN: Ferritin: 266 ng/mL (ref 22–322)

## 2012-06-22 ENCOUNTER — Encounter (HOSPITAL_COMMUNITY)
Admission: RE | Admit: 2012-06-22 | Discharge: 2012-06-22 | Disposition: A | Discharge status: Home / Self Care | Payer: BC Managed Care – PPO | Source: Ambulatory Visit | Attending: Nephrology | Admitting: Nephrology

## 2012-06-22 MED ORDER — DARBEPOETIN ALFA-POLYSORBATE 40 MCG/0.4ML IJ SOLN
INTRAMUSCULAR | Status: AC
  Filled 2012-06-22: qty 0.4

## 2012-06-22 MED ORDER — DARBEPOETIN ALFA-POLYSORBATE 40 MCG/0.4ML IJ SOLN
40.0000 ug | INTRAMUSCULAR | Status: DC
  Administered 2012-06-22: 40 ug via SUBCUTANEOUS

## 2012-06-29 ENCOUNTER — Encounter (HOSPITAL_COMMUNITY)
Admission: RE | Admit: 2012-06-29 | Discharge: 2012-06-29 | Disposition: A | Discharge status: Home / Self Care | Payer: BC Managed Care – PPO | Source: Ambulatory Visit | Attending: Nephrology | Admitting: Nephrology

## 2012-06-29 DIAGNOSIS — N184 Chronic kidney disease, stage 4 (severe): Secondary | ICD-10-CM | POA: Insufficient documentation

## 2012-06-29 DIAGNOSIS — D638 Anemia in other chronic diseases classified elsewhere: Secondary | ICD-10-CM | POA: Insufficient documentation

## 2012-06-29 MED ORDER — DARBEPOETIN ALFA-POLYSORBATE 40 MCG/0.4ML IJ SOLN
INTRAMUSCULAR | Status: AC
  Filled 2012-06-29: qty 0.4

## 2012-06-29 MED ORDER — DARBEPOETIN ALFA-POLYSORBATE 40 MCG/0.4ML IJ SOLN
40.0000 ug | INTRAMUSCULAR | Status: DC
  Administered 2012-06-29: 40 ug via SUBCUTANEOUS

## 2012-07-04 ENCOUNTER — Encounter: Payer: Self-pay | Admitting: Vascular Surgery

## 2012-07-05 ENCOUNTER — Encounter (INDEPENDENT_AMBULATORY_CARE_PROVIDER_SITE_OTHER): Payer: BC Managed Care – PPO | Admitting: *Deleted

## 2012-07-05 ENCOUNTER — Encounter: Payer: Self-pay | Admitting: Vascular Surgery

## 2012-07-05 ENCOUNTER — Ambulatory Visit (INDEPENDENT_AMBULATORY_CARE_PROVIDER_SITE_OTHER): Payer: BC Managed Care – PPO | Admitting: Vascular Surgery

## 2012-07-05 VITALS — BP 158/97 | HR 70 | Resp 16 | Ht 73.0 in | Wt 257.0 lb

## 2012-07-05 DIAGNOSIS — M7989 Other specified soft tissue disorders: Secondary | ICD-10-CM

## 2012-07-05 DIAGNOSIS — N186 End stage renal disease: Secondary | ICD-10-CM

## 2012-07-05 DIAGNOSIS — R2230 Localized swelling, mass and lump, unspecified upper limb: Secondary | ICD-10-CM | POA: Insufficient documentation

## 2012-07-05 DIAGNOSIS — R202 Paresthesia of skin: Secondary | ICD-10-CM

## 2012-07-05 DIAGNOSIS — Z48812 Encounter for surgical aftercare following surgery on the circulatory system: Secondary | ICD-10-CM

## 2012-07-05 DIAGNOSIS — R209 Unspecified disturbances of skin sensation: Secondary | ICD-10-CM

## 2012-07-05 DIAGNOSIS — R229 Localized swelling, mass and lump, unspecified: Secondary | ICD-10-CM

## 2012-07-05 DIAGNOSIS — R2232 Localized swelling, mass and lump, left upper limb: Secondary | ICD-10-CM

## 2012-07-05 DIAGNOSIS — T82897A Other specified complication of cardiac prosthetic devices, implants and grafts, initial encounter: Secondary | ICD-10-CM

## 2012-07-05 NOTE — Progress Notes (Signed)
Vascular and Vein Specialist of [facility]  Patient name: Steven Logan MRN: [id number] DOB: 10-07-1970 Sex: male  REASON FOR VISIT: Follow-up of left radial cephalic AV fistula.   HPI: Steven Logan is a 42 y.o. male who had a left radiocephalic AV fistula placed on 04/07/2012. The cephalic vein was noted to be 4 mm. The radial artery was 2.5 - 3 mm. He comes in for follow visit. He has noted some swelling in the left middle and index finger. He's had no hand pain or paresthesias. He is not yet on dialysis. He denies any recent uremic symptoms.  REVIEW OF SYSTEMS: Valu.Nieves ] denotes positive finding; [  ] denotes negative finding  CARDIOVASCULAR:  [ ]  chest pain   [ ]  dyspnea on exertion    CONSTITUTIONAL:  [ ]  fever   [ ]  chills  PHYSICAL EXAM: Filed Vitals:   07/05/12 1443  BP: 158/97  Pulse: 70  Resp: 16  Height: 6\' 1"  (1.854 m)  Weight: 257 lb (116.574 kg)  SpO2: 99%   Body mass index is 33.91 kg/(m^2). GENERAL: The patient is a well-nourished male, in no acute distress. The vital signs are documented above. CARDIOVASCULAR: There is a regular rate and rhythm  PULMONARY: There is good air exchange bilaterally without wheezing or rales. His left forearm fistula has an excellent thrill. He has excellent Doppler flow in the palm or arch of the left hand. This does augment some with compression of his fistula. I have independently interpreted his duplex of his fistula.diameters of the fistula range from 0.50-0.62 cm. There is a area of stenosis in the proximal fistula is with velocities greater than 600 cm/s. There are several small competing branches noted. The cephalic vein empties into the basilic system in the upper arm.  MEDICAL ISSUES: He appears to have some narrowing in the proximal fistula and also some competing branches. This reason I recommended we proceed with a fistulogram in order to further evaluate this and possibly consider venoplasty of the proximal stenosis which  would help in maturation of his fistula. Currently with diameters are not quite adequate. He is not yet on dialysis so we will limit our dye load. This has been scheduled for 07/17/2012. We've discussed the procedure and risks and all of his questions were answered.  [place] [facility] of [place]: [phone]

## 2012-07-06 ENCOUNTER — Encounter (HOSPITAL_COMMUNITY): Payer: BC Managed Care – PPO

## 2012-07-10 ENCOUNTER — Other Ambulatory Visit: Payer: Self-pay

## 2012-07-17 ENCOUNTER — Encounter (HOSPITAL_COMMUNITY): Admission: RE | Payer: Self-pay | Source: Ambulatory Visit

## 2012-07-17 ENCOUNTER — Ambulatory Visit (HOSPITAL_COMMUNITY)
Admission: RE | Admit: 2012-07-17 | Payer: BC Managed Care – PPO | Source: Ambulatory Visit | Admitting: Vascular Surgery

## 2012-07-17 SURGERY — ASSESSMENT, SHUNT FUNCTION, WITH CONTRAST RADIOGRAPHIC STUDY
Anesthesia: LOCAL

## 2012-07-19 ENCOUNTER — Other Ambulatory Visit: Payer: Self-pay

## 2012-07-21 ENCOUNTER — Other Ambulatory Visit (HOSPITAL_COMMUNITY): Payer: Self-pay | Admitting: *Deleted

## 2012-07-24 ENCOUNTER — Encounter (HOSPITAL_COMMUNITY)
Admission: RE | Admit: 2012-07-24 | Discharge: 2012-07-24 | Disposition: A | Discharge status: Home / Self Care | Payer: BC Managed Care – PPO | Source: Ambulatory Visit | Attending: Nephrology | Admitting: Nephrology

## 2012-07-24 LAB — IRON AND TIBC: UIBC: 173 ug/dL (ref 125–400)

## 2012-07-24 LAB — FERRITIN: Ferritin: 235 ng/mL (ref 22–322)

## 2012-07-24 MED ORDER — DARBEPOETIN ALFA-POLYSORBATE 25 MCG/0.42ML IJ SOLN
INTRAMUSCULAR | Status: AC
  Filled 2012-07-24: qty 0.42

## 2012-07-24 MED ORDER — DARBEPOETIN ALFA-POLYSORBATE 40 MCG/0.4ML IJ SOLN
40.0000 ug | INTRAMUSCULAR | Status: DC
  Administered 2012-07-24: 40 ug via SUBCUTANEOUS

## 2012-07-27 ENCOUNTER — Encounter (HOSPITAL_COMMUNITY): Payer: Self-pay | Admitting: Pharmacy Technician

## 2012-07-31 ENCOUNTER — Encounter (HOSPITAL_COMMUNITY)
Admission: RE | Admit: 2012-07-31 | Discharge: 2012-07-31 | Disposition: A | Discharge status: Home / Self Care | Payer: BC Managed Care – PPO | Source: Ambulatory Visit | Attending: Nephrology | Admitting: Nephrology

## 2012-07-31 DIAGNOSIS — D638 Anemia in other chronic diseases classified elsewhere: Secondary | ICD-10-CM | POA: Insufficient documentation

## 2012-07-31 DIAGNOSIS — N184 Chronic kidney disease, stage 4 (severe): Secondary | ICD-10-CM | POA: Insufficient documentation

## 2012-07-31 MED ORDER — DARBEPOETIN ALFA-POLYSORBATE 40 MCG/0.4ML IJ SOLN
INTRAMUSCULAR | Status: AC
  Filled 2012-07-31: qty 0.4

## 2012-07-31 MED ORDER — DARBEPOETIN ALFA-POLYSORBATE 40 MCG/0.4ML IJ SOLN
40.0000 ug | INTRAMUSCULAR | Status: DC
  Administered 2012-07-31: 40 ug via SUBCUTANEOUS

## 2012-08-07 ENCOUNTER — Ambulatory Visit (HOSPITAL_COMMUNITY)
Admission: RE | Admit: 2012-08-07 | Discharge: 2012-08-07 | Disposition: A | Discharge status: Home / Self Care | Payer: BC Managed Care – PPO | Source: Ambulatory Visit | Attending: Vascular Surgery | Admitting: Vascular Surgery

## 2012-08-07 ENCOUNTER — Encounter (HOSPITAL_COMMUNITY)
Admission: RE | Admit: 2012-08-07 | Discharge: 2012-08-07 | Disposition: A | Discharge status: Home / Self Care | Payer: BC Managed Care – PPO | Source: Ambulatory Visit | Attending: Vascular Surgery | Admitting: Vascular Surgery

## 2012-08-07 ENCOUNTER — Encounter (HOSPITAL_COMMUNITY)
Admission: RE | Disposition: A | Discharge status: Home / Self Care | Payer: Self-pay | Source: Ambulatory Visit | Attending: Vascular Surgery

## 2012-08-07 DIAGNOSIS — T82898A Other specified complication of vascular prosthetic devices, implants and grafts, initial encounter: Secondary | ICD-10-CM | POA: Insufficient documentation

## 2012-08-07 DIAGNOSIS — Y832 Surgical operation with anastomosis, bypass or graft as the cause of abnormal reaction of the patient, or of later complication, without mention of misadventure at the time of the procedure: Secondary | ICD-10-CM | POA: Insufficient documentation

## 2012-08-07 DIAGNOSIS — N186 End stage renal disease: Secondary | ICD-10-CM | POA: Insufficient documentation

## 2012-08-07 DIAGNOSIS — I871 Compression of vein: Secondary | ICD-10-CM | POA: Insufficient documentation

## 2012-08-07 HISTORY — PX: FISTULOGRAM: SHX5832

## 2012-08-07 LAB — PROTIME-INR
INR: 1.36 (ref 0.00–1.49)
Prothrombin Time: 16.4 seconds — ABNORMAL HIGH (ref 11.6–15.2)

## 2012-08-07 LAB — POCT I-STAT, CHEM 8
BUN: 84 mg/dL — ABNORMAL HIGH (ref 6–23)
Calcium, Ion: 0.97 mmol/L — ABNORMAL LOW (ref 1.12–1.23)
Chloride: 112 mEq/L (ref 96–112)
HCT: 38 % — ABNORMAL LOW (ref 39.0–52.0)
Potassium: 5.1 mEq/L (ref 3.5–5.1)
Sodium: 142 mEq/L (ref 135–145)

## 2012-08-07 SURGERY — FISTULOGRAM
Anesthesia: LOCAL | Laterality: Left

## 2012-08-07 MED ORDER — LIDOCAINE HCL (PF) 1 % IJ SOLN
INTRAMUSCULAR | Status: AC
  Filled 2012-08-07: qty 30

## 2012-08-07 MED ORDER — DARBEPOETIN ALFA-POLYSORBATE 40 MCG/0.4ML IJ SOLN: 40.0000 ug | INTRAMUSCULAR | Status: DC

## 2012-08-07 MED ORDER — SODIUM CHLORIDE 0.9 % IJ SOLN: 3.0000 mL | INTRAMUSCULAR | Status: DC | PRN

## 2012-08-07 MED ORDER — HEPARIN (PORCINE) IN NACL 2-0.9 UNIT/ML-% IJ SOLN
INTRAMUSCULAR | Status: AC
  Filled 2012-08-07: qty 500

## 2012-08-07 NOTE — Op Note (Signed)
PATIENT: Steven Logan   MRN: [id number] DOB: 1970/04/17    DATE OF PROCEDURE: 08/07/2012  INDICATIONS: Steven Logan is a 42 y.o. male left radial cephalic AV fistula placed on 04/07/2012. He is not yet on dialysis. Duplex postoperatively suggested some proximal stenosis and competing branches. He presents for fistulogram.  PROCEDURE:  1. Ultrasound-guided access to the left radial cephalic AV fistula 2. fistulogram left radial cephalic AV fistula  SURGEON: Judeth Cornfield. Scot Dock, MD, FACS  ANESTHESIA: local  EBL: minimal  TECHNIQUE: The patient was taken to the peripheral vascular lab. The left upper extremity was prepped and draped in the usual sterile fashion. Under ultrasound guidance, and after the skin was anesthetized, the left radiocephalic AV fistula was cannulated with a micropuncture needle and a micropuncture sheath was introduced over the wire. A fistulogram was then obtained to evaluate the fistula from the site of cannulation to the mid upper arm. A blood pressure cuff was inflated on the upper arm and a retrograde fistulogram was obtained to evaluate the proximal fistula and anastomosis to  FINDINGS:  1. There was some slight irregularity beyond the anastomosis in the proximal fistula however this did not appear to be producing significant stenosis. 2. There was one large competing branches in the proximal fistula and a smaller competing branches above the antecubital level.  CLINICAL NOTE: the patient will be scheduled for ligation of competing branches and possible vein patch angioplasty of the proximal fistula.  [name], MD, FACS [facility] Specialists of [facility]  DATE OF DICTATION:   08/07/2012

## 2012-08-07 NOTE — H&P (Signed)
Vascular and Vein Specialist of [facility]   Patient name: Steven Logan MRN: [id number] DOB: 1970/06/22 Sex: male   REASON FOR VISIT: Follow-up of left radial cephalic AV fistula.   HPI:  Steven Logan is a 42 y.o. male who had a left radiocephalic AV fistula placed on 04/07/2012. The cephalic vein was noted to be 4 mm. The radial artery was 2.5 - 3 mm. He comes in for follow visit. He has noted some swelling in the left middle and index finger. He's had no hand pain or paresthesias. He is not yet on dialysis. He denies any recent uremic symptoms.   REVIEW OF SYSTEMS: Valu.Nieves ] denotes positive finding; [ ]  denotes negative finding  CARDIOVASCULAR: [ ]  chest pain [ ]  dyspnea on exertion  CONSTITUTIONAL: [ ]  fever [ ]  chills   PHYSICAL EXAM:  Filed Vitals:    07/05/12 1443   BP:  158/97   Pulse:  70   Resp:  16   Height:  6\' 1"  (1.854 m)   Weight:  257 lb (116.574 kg)   SpO2:  99%   Body mass index is 33.91 kg/(m^2).  GENERAL: The patient is a well-nourished male, in no acute distress. The vital signs are documented above.  CARDIOVASCULAR: There is a regular rate and rhythm  PULMONARY: There is good air exchange bilaterally without wheezing or rales.  His left forearm fistula has an excellent thrill. He has excellent Doppler flow in the palm or arch of the left hand. This does augment some with compression of his fistula.   I have independently interpreted his duplex of his fistula.diameters of the fistula range from 0.50-0.62 cm. There is a area of stenosis in the proximal fistula is with velocities greater than 600 cm/s. There are several small competing branches noted. The cephalic vein empties into the basilic system in the upper arm.   MEDICAL ISSUES:  He appears to have some narrowing in the proximal fistula and also some competing branches. This reason I recommended we proceed with a fistulogram in order to further evaluate this and possibly consider venoplasty of the proximal  stenosis which would help in maturation of his fistula. Currently with diameters are not quite adequate. He is not yet on dialysis so we will limit our dye load. This has been scheduled for 07/17/2012. We've discussed the procedure and risks and all of his questions were answered.   [place]  [facility] Ve[facility] Specialists of   [place]: [phone]

## 2012-08-14 ENCOUNTER — Encounter (HOSPITAL_COMMUNITY): Payer: BC Managed Care – PPO

## 2012-08-18 ENCOUNTER — Encounter (HOSPITAL_COMMUNITY)
Admission: RE | Admit: 2012-08-18 | Discharge: 2012-08-18 | Disposition: A | Discharge status: Home / Self Care | Payer: BC Managed Care – PPO | Source: Ambulatory Visit | Attending: Nephrology | Admitting: Nephrology

## 2012-08-18 MED ORDER — DARBEPOETIN ALFA-POLYSORBATE 40 MCG/0.4ML IJ SOLN
INTRAMUSCULAR | Status: AC
  Administered 2012-08-18: 40 ug via SUBCUTANEOUS
  Filled 2012-08-18: qty 0.4

## 2012-08-18 MED ORDER — DARBEPOETIN ALFA-POLYSORBATE 40 MCG/0.4ML IJ SOLN: 40.0000 ug | INTRAMUSCULAR | Status: DC

## 2012-08-21 LAB — POCT HEMOGLOBIN-HEMACUE: Hemoglobin: 11.3 g/dL — ABNORMAL LOW (ref 13.0–17.0)

## 2012-08-25 ENCOUNTER — Encounter (HOSPITAL_COMMUNITY)
Admission: RE | Admit: 2012-08-25 | Discharge: 2012-08-25 | Disposition: A | Discharge status: Home / Self Care | Payer: BC Managed Care – PPO | Source: Ambulatory Visit | Attending: Nephrology | Admitting: Nephrology

## 2012-08-25 DIAGNOSIS — D509 Iron deficiency anemia, unspecified: Secondary | ICD-10-CM | POA: Insufficient documentation

## 2012-08-25 LAB — IRON AND TIBC: Iron: 39 ug/dL — ABNORMAL LOW (ref 42–135)

## 2012-08-25 MED ORDER — DARBEPOETIN ALFA-POLYSORBATE 40 MCG/0.4ML IJ SOLN
INTRAMUSCULAR | Status: AC
  Administered 2012-08-25: 40 ug via SUBCUTANEOUS
  Filled 2012-08-25: qty 0.4

## 2012-08-25 MED ORDER — DARBEPOETIN ALFA-POLYSORBATE 40 MCG/0.4ML IJ SOLN: 40.0000 ug | INTRAMUSCULAR | Status: DC

## 2012-09-01 ENCOUNTER — Encounter (HOSPITAL_COMMUNITY)
Admission: RE | Admit: 2012-09-01 | Discharge: 2012-09-01 | Disposition: A | Discharge status: Home / Self Care | Payer: BC Managed Care – PPO | Source: Ambulatory Visit | Attending: Nephrology | Admitting: Nephrology

## 2012-09-01 ENCOUNTER — Other Ambulatory Visit: Payer: Self-pay

## 2012-09-01 MED ORDER — DARBEPOETIN ALFA-POLYSORBATE 40 MCG/0.4ML IJ SOLN: 40.0000 ug | INTRAMUSCULAR | Status: DC

## 2012-09-01 MED ORDER — DARBEPOETIN ALFA-POLYSORBATE 40 MCG/0.4ML IJ SOLN
INTRAMUSCULAR | Status: AC
  Administered 2012-09-01: 40 ug via SUBCUTANEOUS
  Filled 2012-09-01: qty 0.4

## 2012-09-04 LAB — POCT HEMOGLOBIN-HEMACUE: Hemoglobin: 11.6 g/dL — ABNORMAL LOW (ref 13.0–17.0)

## 2012-09-08 ENCOUNTER — Encounter (HOSPITAL_COMMUNITY): Payer: BC Managed Care – PPO

## 2012-09-11 ENCOUNTER — Telehealth: Payer: Self-pay

## 2012-09-11 NOTE — Telephone Encounter (Signed)
Phone call to Amy, nurse for Dr. Dagmar Hait.  Informed her that pt. is scheduled for "ligation of competing branches of left AV fistula with possible vein patch angioplasty"  09/19/12, and has been instructed to hold Coumadin after 09/13/12; questioned about bridging with Lovenox while off Coumadin.  Amy stated that due to pt. being noncompliant in following-up with several appts. for PT/ INR, he has been discharged from their practice.  Called pt. to inform him that he will need to get established with another PCP for Coumadin management.  Pt. verbalized understanding, and said he will work on that.  Informed pt. that we will need to postpone surgery until he has a PCP that will monitor PT/ INR and recommend Coumadin/ Lovenox dosing.  Stated he will call back to reschedule his surgery after he has another PCP.  Will forward this note to Dr. Scot Dock and make him aware of the situation.

## 2012-09-12 ENCOUNTER — Inpatient Hospital Stay (HOSPITAL_COMMUNITY): Admission: RE | Admit: 2012-09-12 | Payer: BC Managed Care – PPO | Source: Ambulatory Visit

## 2012-09-13 ENCOUNTER — Other Ambulatory Visit: Payer: Self-pay

## 2012-09-13 ENCOUNTER — Encounter (HOSPITAL_COMMUNITY): Payer: Self-pay | Admitting: Pharmacy Technician

## 2012-09-13 NOTE — Telephone Encounter (Signed)
Dr. Scot Dock made aware of Dr. Justin Mend agreeing to manage pt's Coumadin, and of request to place dialysis catheter in addition to Ligation of Competing Branches on 09/19/12.

## 2012-09-13 NOTE — Telephone Encounter (Signed)
Rec'd phone call from Dr. Jason Nest office; spoke with [place].  Stated Dr. Justin Mend has agreed to take over the management of pt's Coumadin, and requests to "add an insertion of dialysis catheter to procedure scheduled for 09/19/12".  Per Doristine Bosworth, pt. will hold Coumadin 5 days prior to procedure, and start Lovenox bridging per orders of Dr. Justin Mend.  Notified pt. that procedure has not been cancelled for 8/26, and of plan to add procedure for "Insertion of Dialysis Catheter", in addition to Ligation of competing Branches of (L) AVF with possible vein patch angioplasty.  Verbalized understanding.

## 2012-09-14 ENCOUNTER — Encounter (HOSPITAL_COMMUNITY): Payer: Self-pay | Admitting: *Deleted

## 2012-09-14 NOTE — Progress Notes (Signed)
09/14/12 1840  OBSTRUCTIVE SLEEP APNEA  Have you ever been diagnosed with sleep apnea through a sleep study? No  Do you snore loudly (loud enough to be heard through closed doors)?  1  Do you often feel tired, fatigued, or sleepy during the daytime? 1  Has anyone observed you stop breathing during your sleep? 0  Do you have, or are you being treated for high blood pressure? 1  BMI more than 35 kg/m2? 1  Age over 41 years old? 0  Gender: 1 (unsure)  Obstructive Sleep Apnea Score 5

## 2012-09-15 ENCOUNTER — Encounter: Payer: Self-pay | Admitting: Internal Medicine

## 2012-09-18 MED ORDER — DEXTROSE 5 % IV SOLN
1.5000 g | INTRAVENOUS | Status: AC
  Administered 2012-09-19: 1.5 g via INTRAVENOUS
  Filled 2012-09-18: qty 1.5

## 2012-09-19 ENCOUNTER — Ambulatory Visit (HOSPITAL_COMMUNITY): Payer: BC Managed Care – PPO

## 2012-09-19 ENCOUNTER — Ambulatory Visit (HOSPITAL_COMMUNITY): Payer: BC Managed Care – PPO | Admitting: Anesthesiology

## 2012-09-19 ENCOUNTER — Ambulatory Visit (HOSPITAL_COMMUNITY)
Admission: RE | Admit: 2012-09-19 | Discharge: 2012-09-19 | Disposition: A | Discharge status: Home / Self Care | Payer: BC Managed Care – PPO | Source: Ambulatory Visit | Attending: Vascular Surgery | Admitting: Vascular Surgery

## 2012-09-19 ENCOUNTER — Encounter (HOSPITAL_COMMUNITY): Payer: Self-pay | Admitting: Anesthesiology

## 2012-09-19 ENCOUNTER — Encounter (HOSPITAL_COMMUNITY)
Admission: RE | Disposition: A | Discharge status: Home / Self Care | Payer: Self-pay | Source: Ambulatory Visit | Attending: Vascular Surgery

## 2012-09-19 DIAGNOSIS — Z992 Dependence on renal dialysis: Secondary | ICD-10-CM | POA: Insufficient documentation

## 2012-09-19 DIAGNOSIS — N185 Chronic kidney disease, stage 5: Secondary | ICD-10-CM | POA: Insufficient documentation

## 2012-09-19 DIAGNOSIS — T82598A Other mechanical complication of other cardiac and vascular devices and implants, initial encounter: Secondary | ICD-10-CM | POA: Insufficient documentation

## 2012-09-19 DIAGNOSIS — I120 Hypertensive chronic kidney disease with stage 5 chronic kidney disease or end stage renal disease: Secondary | ICD-10-CM | POA: Insufficient documentation

## 2012-09-19 DIAGNOSIS — N189 Chronic kidney disease, unspecified: Secondary | ICD-10-CM

## 2012-09-19 DIAGNOSIS — Y832 Surgical operation with anastomosis, bypass or graft as the cause of abnormal reaction of the patient, or of later complication, without mention of misadventure at the time of the procedure: Secondary | ICD-10-CM | POA: Insufficient documentation

## 2012-09-19 DIAGNOSIS — T82898A Other specified complication of vascular prosthetic devices, implants and grafts, initial encounter: Secondary | ICD-10-CM

## 2012-09-19 HISTORY — DX: Pneumonia, unspecified organism: J18.9

## 2012-09-19 HISTORY — PX: LIGATION OF COMPETING BRANCHES OF ARTERIOVENOUS FISTULA: SHX5949

## 2012-09-19 HISTORY — PX: INSERTION OF DIALYSIS CATHETER: SHX1324

## 2012-09-19 LAB — PROTIME-INR
INR: 1.15 (ref 0.00–1.49)
Prothrombin Time: 14.5 seconds (ref 11.6–15.2)

## 2012-09-19 LAB — POCT I-STAT 4, (NA,K, GLUC, HGB,HCT)
Glucose, Bld: 107 mg/dL — ABNORMAL HIGH (ref 70–99)
HCT: 37 % — ABNORMAL LOW (ref 39.0–52.0)
Sodium: 145 mEq/L (ref 135–145)

## 2012-09-19 SURGERY — LIGATION OF COMPETING BRANCHES OF ARTERIOVENOUS FISTULA
Anesthesia: General | Site: Neck | Laterality: Right | Wound class: Clean

## 2012-09-19 MED ORDER — 0.9 % SODIUM CHLORIDE (POUR BTL) OPTIME
TOPICAL | Status: DC | PRN
  Administered 2012-09-19: 1000 mL

## 2012-09-19 MED ORDER — HEPARIN SODIUM (PORCINE) 1000 UNIT/ML IJ SOLN
INTRAMUSCULAR | Status: DC | PRN
  Administered 2012-09-19: 10 mL

## 2012-09-19 MED ORDER — OXYCODONE HCL 5 MG PO TABS: 5.0000 mg | ORAL_TABLET | Freq: Once | ORAL | Status: DC | PRN

## 2012-09-19 MED ORDER — MIDAZOLAM HCL 5 MG/5ML IJ SOLN
INTRAMUSCULAR | Status: DC | PRN
  Administered 2012-09-19: 2 mg via INTRAVENOUS

## 2012-09-19 MED ORDER — OXYCODONE HCL 5 MG/5ML PO SOLN: 5.0000 mg | Freq: Once | ORAL | Status: DC | PRN

## 2012-09-19 MED ORDER — SODIUM CHLORIDE 0.9 % IR SOLN
Status: DC | PRN
  Administered 2012-09-19: 07:00:00

## 2012-09-19 MED ORDER — LIDOCAINE-EPINEPHRINE (PF) 1 %-1:200000 IJ SOLN
INTRAMUSCULAR | Status: AC
  Filled 2012-09-19: qty 10

## 2012-09-19 MED ORDER — EPHEDRINE SULFATE 50 MG/ML IJ SOLN
INTRAMUSCULAR | Status: DC | PRN
  Administered 2012-09-19 (×5): 10 mg via INTRAVENOUS

## 2012-09-19 MED ORDER — PHENYLEPHRINE HCL 10 MG/ML IJ SOLN
INTRAMUSCULAR | Status: DC | PRN
  Administered 2012-09-19 (×2): 160 ug via INTRAVENOUS
  Administered 2012-09-19: 80 ug via INTRAVENOUS
  Administered 2012-09-19: 160 ug via INTRAVENOUS
  Administered 2012-09-19: 80 ug via INTRAVENOUS
  Administered 2012-09-19: 160 ug via INTRAVENOUS

## 2012-09-19 MED ORDER — HYDROMORPHONE HCL PF 1 MG/ML IJ SOLN
INTRAMUSCULAR | Status: AC
  Filled 2012-09-19: qty 1

## 2012-09-19 MED ORDER — OXYCODONE-ACETAMINOPHEN 5-325 MG PO TABS
1.0000 | ORAL_TABLET | ORAL | Status: DC | PRN
Start: 2012-09-19 — End: 2013-05-22

## 2012-09-19 MED ORDER — HYDROMORPHONE HCL PF 1 MG/ML IJ SOLN
0.2500 mg | INTRAMUSCULAR | Status: DC | PRN
  Administered 2012-09-19 (×2): 0.5 mg via INTRAVENOUS

## 2012-09-19 MED ORDER — SODIUM CHLORIDE 0.9 % IV SOLN
INTRAVENOUS | Status: DC
  Administered 2012-09-19: 07:00:00 via INTRAVENOUS

## 2012-09-19 MED ORDER — ONDANSETRON HCL 4 MG/2ML IJ SOLN: 4.0000 mg | Freq: Once | INTRAMUSCULAR | Status: DC | PRN

## 2012-09-19 MED ORDER — FENTANYL CITRATE 0.05 MG/ML IJ SOLN
INTRAMUSCULAR | Status: DC | PRN
  Administered 2012-09-19 (×2): 100 ug via INTRAVENOUS
  Administered 2012-09-19: 50 ug via INTRAVENOUS

## 2012-09-19 MED ORDER — PROPOFOL 10 MG/ML IV BOLUS
INTRAVENOUS | Status: DC | PRN
  Administered 2012-09-19: 50 mg via INTRAVENOUS
  Administered 2012-09-19: 150 mg via INTRAVENOUS

## 2012-09-19 MED ORDER — HEPARIN SODIUM (PORCINE) 1000 UNIT/ML IJ SOLN
INTRAMUSCULAR | Status: AC
  Filled 2012-09-19: qty 1

## 2012-09-19 MED ORDER — MEPERIDINE HCL 25 MG/ML IJ SOLN: 6.2500 mg | INTRAMUSCULAR | Status: DC | PRN

## 2012-09-19 MED ORDER — ONDANSETRON HCL 4 MG/2ML IJ SOLN
INTRAMUSCULAR | Status: DC | PRN
  Administered 2012-09-19: 4 mg via INTRAVENOUS

## 2012-09-19 MED ORDER — LIDOCAINE HCL (PF) 1 % IJ SOLN
INTRAMUSCULAR | Status: AC
  Filled 2012-09-19: qty 30

## 2012-09-19 MED ORDER — LIDOCAINE HCL (CARDIAC) 10 MG/ML IV SOLN
INTRAVENOUS | Status: DC | PRN
  Administered 2012-09-19: 100 mg via INTRAVENOUS

## 2012-09-19 MED ORDER — LABETALOL HCL 5 MG/ML IV SOLN
INTRAVENOUS | Status: AC
  Administered 2012-09-19: 20 mg
  Filled 2012-09-19: qty 4

## 2012-09-19 SURGICAL SUPPLY — 61 items
BAG DECANTER FOR FLEXI CONT (MISCELLANEOUS) IMPLANT
CANISTER SUCTION 2500CC (MISCELLANEOUS) ×3 IMPLANT
CATH CANNON HEMO 15F 50CM (CATHETERS) IMPLANT
CATH CANNON HEMO 15FR 19 (HEMODIALYSIS SUPPLIES) IMPLANT
CATH CANNON HEMO 15FR 23CM (HEMODIALYSIS SUPPLIES) ×3 IMPLANT
CATH CANNON HEMO 15FR 31CM (HEMODIALYSIS SUPPLIES) IMPLANT
CATH CANNON HEMO 15FR 32CM (HEMODIALYSIS SUPPLIES) IMPLANT
CHLORAPREP W/TINT 26ML (MISCELLANEOUS) ×3 IMPLANT
CLOTH BEACON ORANGE TIMEOUT ST (SAFETY) ×3 IMPLANT
COVER PROBE W GEL 5X96 (DRAPES) ×3 IMPLANT
COVER SURGICAL LIGHT HANDLE (MISCELLANEOUS) ×3 IMPLANT
DERMABOND ADVANCED (GAUZE/BANDAGES/DRESSINGS) ×2
DERMABOND ADVANCED .7 DNX12 (GAUZE/BANDAGES/DRESSINGS) ×4 IMPLANT
DRAPE C-ARM 42X72 X-RAY (DRAPES) ×3 IMPLANT
DRAPE CHEST BREAST 15X10 FENES (DRAPES) ×3 IMPLANT
DRAPE PROXIMA HALF (DRAPES) ×3 IMPLANT
ELECT REM PT RETURN 9FT ADLT (ELECTROSURGICAL) ×3
ELECTRODE REM PT RTRN 9FT ADLT (ELECTROSURGICAL) ×2 IMPLANT
GAUZE SPONGE 2X2 8PLY STRL LF (GAUZE/BANDAGES/DRESSINGS) ×2 IMPLANT
GAUZE SPONGE 4X4 16PLY XRAY LF (GAUZE/BANDAGES/DRESSINGS) IMPLANT
GEL ULTRASOUND 20GR AQUASONIC (MISCELLANEOUS) IMPLANT
GLOVE BIO SURGEON STRL SZ 6.5 (GLOVE) ×12 IMPLANT
GLOVE BIO SURGEON STRL SZ7.5 (GLOVE) ×6 IMPLANT
GLOVE BIOGEL PI IND STRL 6.5 (GLOVE) ×2 IMPLANT
GLOVE BIOGEL PI IND STRL 7.0 (GLOVE) ×4 IMPLANT
GLOVE BIOGEL PI IND STRL 8 (GLOVE) ×4 IMPLANT
GLOVE BIOGEL PI INDICATOR 6.5 (GLOVE) ×1
GLOVE BIOGEL PI INDICATOR 7.0 (GLOVE) ×2
GLOVE BIOGEL PI INDICATOR 8 (GLOVE) ×2
GLOVE ECLIPSE 6.5 STRL STRAW (GLOVE) ×12 IMPLANT
GOWN STRL NON-REIN LRG LVL3 (GOWN DISPOSABLE) ×9 IMPLANT
KIT BASIN OR (CUSTOM PROCEDURE TRAY) IMPLANT
KIT ROOM TURNOVER OR (KITS) ×3 IMPLANT
NEEDLE 18GX1X1/2 (RX/OR ONLY) (NEEDLE) ×3 IMPLANT
NEEDLE 22X1 1/2 (OR ONLY) (NEEDLE) ×3 IMPLANT
NEEDLE HYPO 25GX1X1/2 BEV (NEEDLE) ×3 IMPLANT
NS IRRIG 1000ML POUR BTL (IV SOLUTION) ×3 IMPLANT
PACK CV ACCESS (CUSTOM PROCEDURE TRAY) ×3 IMPLANT
PACK SURGICAL SETUP 50X90 (CUSTOM PROCEDURE TRAY) IMPLANT
PAD ARMBOARD 7.5X6 YLW CONV (MISCELLANEOUS) ×6 IMPLANT
SPONGE GAUZE 2X2 STER 10/PKG (GAUZE/BANDAGES/DRESSINGS) ×1
SPONGE GAUZE 4X4 12PLY (GAUZE/BANDAGES/DRESSINGS) IMPLANT
SPONGE SURGIFOAM ABS GEL 100 (HEMOSTASIS) IMPLANT
SUT ETHILON 3 0 PS 1 (SUTURE) ×3 IMPLANT
SUT PROLENE 6 0 BV (SUTURE) ×3 IMPLANT
SUT SILK 0 TIES 10X30 (SUTURE) ×3 IMPLANT
SUT VIC AB 3-0 SH 27 (SUTURE) ×1
SUT VIC AB 3-0 SH 27X BRD (SUTURE) ×2 IMPLANT
SUT VICRYL 4-0 PS2 18IN ABS (SUTURE) ×6 IMPLANT
SWAB COLLECTION DEVICE MRSA (MISCELLANEOUS) IMPLANT
SYR 20CC LL (SYRINGE) ×3 IMPLANT
SYR 30ML LL (SYRINGE) IMPLANT
SYR 5ML LL (SYRINGE) ×6 IMPLANT
SYR CONTROL 10ML LL (SYRINGE) IMPLANT
SYRINGE 10CC LL (SYRINGE) ×3 IMPLANT
TAPE CLOTH SURG 4X10 WHT LF (GAUZE/BANDAGES/DRESSINGS) ×3 IMPLANT
TOWEL OR 17X24 6PK STRL BLUE (TOWEL DISPOSABLE) ×6 IMPLANT
TOWEL OR 17X26 10 PK STRL BLUE (TOWEL DISPOSABLE) ×3 IMPLANT
TUBE ANAEROBIC SPECIMEN COL (MISCELLANEOUS) IMPLANT
UNDERPAD 30X30 INCONTINENT (UNDERPADS AND DIAPERS) ×3 IMPLANT
WATER STERILE IRR 1000ML POUR (IV SOLUTION) ×3 IMPLANT

## 2012-09-19 NOTE — Preoperative (Signed)
Beta Blockers   Reason not to administer Beta Blockers:Not Applicable 

## 2012-09-19 NOTE — Progress Notes (Signed)
Dr Conrad [place] called, bp 207/113,  Ordered Labetelol 10 mg iv now

## 2012-09-19 NOTE — Op Note (Signed)
NAME: Steven Logan   MRN: [id number] DOB: Jan 14, 1971    DATE OF OPERATION: 09/19/2012  PREOP DIAGNOSIS: Chronic kidney disease stage V  POSTOP DIAGNOSIS: Same  PROCEDURE:  1. Ultrasound guided placement of right IJ 23 cm Diatek catheter 2. Revision of left radial cephalic AV fistula and ligation of competing branches   SURGEON: Judeth Cornfield. Scot Dock, MD, FACS  ASSIST: Steven Locket, PA  ANESTHESIA: Gen.   EBL: minimal  INDICATIONS: Steven Logan is a 42 y.o. male who had a left radiocephalic AV fistula which has been slow to mature. Fistulogram demonstrated one large competing branch and possibly some mild irregularity at the proximal end of the fistula. He comes in for revision.  FINDINGS: there was one large competing branch which was ligated also a smaller adjacent competing branch. I was able to pass a 5 mm dilator through the area of stenosis in the proximal fistula. The this was a dressed with a Dotter technique.  TECHNIQUE: The patient was taken to the operating room and received a general anesthetic. After careful positioning, the neck and upper chest were prepped and draped in the usual sterile fashion. Under ultrasound guidance, the right IJ was cannulated and a guidewire introduced into the isuperior vena cava under fluoroscopic control. The tract over the wire was dilated and then a dilator and peel-away sheath were passed over the wire the wire and dilator removed. A 23 cm catheter was passed through the sheath and positioned in the right atrium. The exit site of the cath was selected. The cath was then brought through the tunnel cut to the appropriate length and the distal port were attached. Both ports withdrew easily with and flushed with heparinized saline filled concentrated heparin. The catheter was secured at its exit site with a 3-0 nylon suture. The IJ cannulation site was closed with a 4-0 subcuticular stitch. Dermabond was applied. Sterile dressing was  applied.  Next attention was turned to revision of the left radial cephalic AV fistula. The left upper extremity was prepped and draped in the usual sterile fashion. I had interrogated the fistula with the Doppler to identify the large competing branch. The area of possible stenosis was proximal to this. A longitudinal incision was made over this area and the vein here was dissected free. It was a large vein appeared to be maturing recently well. There was one very large competing branch which I controlled distally. Smaller competing branch was ligated and divided. I then ligated the competing branch distally and preserved the branch so that I could pass progressively larger dilators through the area of stenosis in the proximal fistula. I could tell that there was some irregularity in this area but I was able to gradually dilate this up using a 5 mm dilator. The dilator was passed in the opposite direction without stenosis. The branch was then ligated. At the completion of the procedure, the deep layer was closed 3-0 Vicryl. The skin was closed with 4-0 subcuticular stitch. Dermabond was applied. The patient tolerated the procedure well and transferred to the recovery room in stable condition. All needle and sponge counts were correct.  Steven Mayo, MD, FACS [facility] Vein Specialists of [facility]  DATE OF DICTATION:   09/19/2012

## 2012-09-19 NOTE — Anesthesia Postprocedure Evaluation (Signed)
Anesthesia Post Note  Patient: Steven Logan  Procedure(s) Performed: Procedure(s) (LRB): LIGATION OF COMPETING BRANCHES OF LEFT ARM ARTERIOVENOUS FISTULA; Vein angioplasty (Left) INSERTION OF DIALYSIS CATHETER (Right)  Anesthesia type: general  Patient location: PACU  Post pain: Pain level controlled  Post assessment: Patient's Cardiovascular Status Stable  Last Vitals:  Filed Vitals:   09/19/12 1049  BP: 207/113  Pulse: 69  Temp:   Resp:     Post vital signs: Reviewed and stable  Level of consciousness: sedated  Complications: No apparent anesthesia complications

## 2012-09-19 NOTE — Progress Notes (Signed)
Dr. Conrad [place] made aware of cxr in April showing bil pleural effusions, patient afebrile and denies any Sob cough etc.  No need to repeat cxr per Dr. Conrad [place].

## 2012-09-19 NOTE — Progress Notes (Signed)
IV NS Started, Labetalol given IV push at

## 2012-09-19 NOTE — Progress Notes (Signed)
Lt arm fistula with positive thrill/bruit.  Site U

## 2012-09-19 NOTE — Transfer of Care (Signed)
Immediate Anesthesia Transfer of Care Note  Patient: Steven Logan  Procedure(s) Performed: Procedure(s): LIGATION OF COMPETING BRANCHES OF LEFT ARM ARTERIOVENOUS FISTULA; Vein angioplasty (Left) INSERTION OF DIALYSIS CATHETER (Right)  Patient Location: PACU  Anesthesia Type:General  Level of Consciousness: awake, alert , oriented and patient cooperative  Airway & Oxygen Therapy: Patient Spontanous Breathing and Patient connected to nasal cannula oxygen  Post-op Assessment: Report given to PACU RN, Post -op Vital signs reviewed and stable and Patient moving all extremities  Post vital signs: Reviewed and stable  Complications: No apparent anesthesia complications

## 2012-09-19 NOTE — Anesthesia Preprocedure Evaluation (Addendum)
Anesthesia Evaluation  Patient identified by MRN, date of birth, ID band Patient awake    Reviewed: Allergy & Precautions, H&P , NPO status , Patient's Chart, lab work & pertinent test results  History of Anesthesia Complications Negative for: history of anesthetic complications  Airway Mallampati: I TM Distance: >3 FB Neck ROM: Full    Dental   Pulmonary neg shortness of breath, pneumonia -, resolved, PE pe in 2011          Cardiovascular Exercise Tolerance: Good hypertension, Pt. on medications and Pt. on home beta blockers  ekg - nsr    Neuro/Psych PSYCHIATRIC DISORDERS Depression    GI/Hepatic Hx pancreatitis    Endo/Other  diabetes, Well Controlled, Type 2, Insulin Dependent  Renal/GU Renal InsufficiencyRenal diseaseHas not started dialysis yet      Musculoskeletal   Abdominal   Peds  Hematology   Anesthesia Other Findings   Reproductive/Obstetrics                          Anesthesia Physical Anesthesia Plan  ASA: III  Anesthesia Plan: General   Post-op Pain Management:    Induction: Intravenous  Airway Management Planned: LMA  Additional Equipment:   Intra-op Plan:   Post-operative Plan: Extubation in OR  Informed Consent: I have reviewed the patients History and Physical, chart, labs and discussed the procedure including the risks, benefits and alternatives for the proposed anesthesia with the patient or authorized representative who has indicated his/her understanding and acceptance.   Dental advisory given  Plan Discussed with: CRNA and Surgeon  Anesthesia Plan Comments:       Anesthesia Quick Evaluation

## 2012-09-19 NOTE — H&P (Signed)
[facility]   Patient name: Steven Logan MRN: [id number] DOB: 12/06/1970 Sex: male   REASON FOR VISIT: Follow-up of left radial cephalic AV fistula.   HPI:  Steven Logan is a 42 y.o. male who had a left radiocephalic AV fistula placed on 04/07/2012. The cephalic vein was noted to be 4 mm. The radial artery was 2.5 - 3 mm. He comes in for follow visit. He has noted some swelling in the left middle and index finger. He's had no hand pain or paresthesias. He is not yet on dialysis. He denies any recent uremic symptoms.   REVIEW OF SYSTEMS: Valu.Nieves ] denotes positive finding; [ ]  denotes negative finding  CARDIOVASCULAR: [ ]  chest pain [ ]  dyspnea on exertion  CONSTITUTIONAL: [ ]  fever [ ]  chills   PHYSICAL EXAM:  Filed Vitals:    07/05/12 1443   BP:  158/97   Pulse:  70   Resp:  16   Height:  6\' 1"  (1.854 m)   Weight:  257 lb (116.574 kg)   SpO2:  99%   Body mass index is 33.91 kg/(m^2).  GENERAL: The patient is a well-nourished male, in no acute distress. The vital signs are documented above.  CARDIOVASCULAR: There is a regular rate and rhythm  PULMONARY: There is good air exchange bilaterally without wheezing or rales.  His left forearm fistula has an excellent thrill. He has excellent Doppler flow in the palm or arch of the left hand. This does augment some with compression of his fistula.  I have independently interpreted his duplex of his fistula.diameters of the fistula range from 0.50-0.62 cm. There is a area of stenosis in the proximal fistula is with velocities greater than 600 cm/s. There are several small competing branches noted. The cephalic vein empties into the basilic system in the upper arm.    Fistulogram: 1. There was some slight irregularity beyond the anastomosis in the proximal fistula however this did not appear to be producing significant stenosis.  2. There was one large competing branches in the proximal fistula and a smaller  competing branches above the antecubital level.   MEDICAL ISSUES:  The patient presents for ligation of competing branches and possible vein patch angioplasty of the proximal fistula.  Leith-Hatfield  [facility] [place]  Beeper: [phone]

## 2012-09-20 ENCOUNTER — Encounter (HOSPITAL_COMMUNITY): Payer: Self-pay | Admitting: Vascular Surgery

## 2012-09-27 ENCOUNTER — Encounter: Payer: Self-pay | Admitting: *Deleted

## 2012-10-09 ENCOUNTER — Emergency Department (HOSPITAL_COMMUNITY)
Admission: EM | Admit: 2012-10-09 | Discharge: 2012-10-09 | Disposition: A | Discharge status: Home / Self Care | Payer: BC Managed Care – PPO | Attending: Emergency Medicine | Admitting: Emergency Medicine

## 2012-10-09 ENCOUNTER — Encounter (HOSPITAL_COMMUNITY): Payer: Self-pay | Admitting: Emergency Medicine

## 2012-10-09 ENCOUNTER — Emergency Department (HOSPITAL_COMMUNITY): Payer: BC Managed Care – PPO

## 2012-10-09 DIAGNOSIS — Z86711 Personal history of pulmonary embolism: Secondary | ICD-10-CM | POA: Insufficient documentation

## 2012-10-09 DIAGNOSIS — M129 Arthropathy, unspecified: Secondary | ICD-10-CM | POA: Insufficient documentation

## 2012-10-09 DIAGNOSIS — H538 Other visual disturbances: Secondary | ICD-10-CM | POA: Insufficient documentation

## 2012-10-09 DIAGNOSIS — Z8719 Personal history of other diseases of the digestive system: Secondary | ICD-10-CM | POA: Insufficient documentation

## 2012-10-09 DIAGNOSIS — I120 Hypertensive chronic kidney disease with stage 5 chronic kidney disease or end stage renal disease: Secondary | ICD-10-CM | POA: Insufficient documentation

## 2012-10-09 DIAGNOSIS — Z872 Personal history of diseases of the skin and subcutaneous tissue: Secondary | ICD-10-CM | POA: Insufficient documentation

## 2012-10-09 DIAGNOSIS — Z7901 Long term (current) use of anticoagulants: Secondary | ICD-10-CM | POA: Insufficient documentation

## 2012-10-09 DIAGNOSIS — Z79899 Other long term (current) drug therapy: Secondary | ICD-10-CM | POA: Insufficient documentation

## 2012-10-09 DIAGNOSIS — R509 Fever, unspecified: Secondary | ICD-10-CM | POA: Insufficient documentation

## 2012-10-09 DIAGNOSIS — Z8701 Personal history of pneumonia (recurrent): Secondary | ICD-10-CM | POA: Insufficient documentation

## 2012-10-09 DIAGNOSIS — Z8659 Personal history of other mental and behavioral disorders: Secondary | ICD-10-CM | POA: Insufficient documentation

## 2012-10-09 DIAGNOSIS — Z794 Long term (current) use of insulin: Secondary | ICD-10-CM | POA: Insufficient documentation

## 2012-10-09 DIAGNOSIS — R42 Dizziness and giddiness: Secondary | ICD-10-CM | POA: Insufficient documentation

## 2012-10-09 DIAGNOSIS — R5381 Other malaise: Secondary | ICD-10-CM | POA: Insufficient documentation

## 2012-10-09 DIAGNOSIS — N186 End stage renal disease: Secondary | ICD-10-CM | POA: Insufficient documentation

## 2012-10-09 DIAGNOSIS — E119 Type 2 diabetes mellitus without complications: Secondary | ICD-10-CM | POA: Insufficient documentation

## 2012-10-09 DIAGNOSIS — Z862 Personal history of diseases of the blood and blood-forming organs and certain disorders involving the immune mechanism: Secondary | ICD-10-CM | POA: Insufficient documentation

## 2012-10-09 LAB — COMPREHENSIVE METABOLIC PANEL
BUN: 46 mg/dL — ABNORMAL HIGH (ref 6–23)
CO2: 22 mEq/L (ref 19–32)
Calcium: 7.4 mg/dL — ABNORMAL LOW (ref 8.4–10.5)
Creatinine, Ser: 5.73 mg/dL — ABNORMAL HIGH (ref 0.50–1.35)
GFR calc Af Amer: 13 mL/min — ABNORMAL LOW (ref >90)
GFR calc non Af Amer: 11 mL/min — ABNORMAL LOW (ref >90)
Glucose, Bld: 89 mg/dL (ref 70–99)

## 2012-10-09 LAB — CBC
Hemoglobin: 11.3 g/dL — ABNORMAL LOW (ref 13.0–17.0)
MCH: 26.5 pg (ref 26.0–34.0)
MCV: 82.9 fL (ref 78.0–100.0)
RBC: 4.27 MIL/uL (ref 4.22–5.81)

## 2012-10-09 LAB — PROTIME-INR: INR: 1.47 (ref 0.00–1.49)

## 2012-10-09 MED ORDER — ACETAMINOPHEN 500 MG PO TABS
1000.0000 mg | ORAL_TABLET | Freq: Once | ORAL | Status: AC
  Administered 2012-10-09: 1000 mg via ORAL
  Filled 2012-10-09: qty 2

## 2012-10-09 MED ORDER — SODIUM CHLORIDE 0.9 % IV SOLN
1000.0000 mL | INTRAVENOUS | Status: DC
  Administered 2012-10-09: 1000 mL via INTRAVENOUS

## 2012-10-09 MED ORDER — HYDROCODONE-ACETAMINOPHEN 5-325 MG PO TABS: 1.0000 | ORAL_TABLET | Freq: Four times a day (QID) | ORAL | Status: DC | PRN

## 2012-10-09 MED ORDER — MORPHINE SULFATE 4 MG/ML IJ SOLN
4.0000 mg | Freq: Once | INTRAMUSCULAR | Status: AC
  Administered 2012-10-09: 4 mg via INTRAVENOUS
  Filled 2012-10-09: qty 1

## 2012-10-09 NOTE — ED Notes (Signed)
Ordered renal tray for pt.

## 2012-10-09 NOTE — ED Notes (Signed)
Weak dizzy h/a started at 1 am is new dialysis pt that just had first week ( MWF) last week has graft left arm still curing and  Port rt chest that have been using  Pt was  diphortic upon ems arrival cbg was 123

## 2012-10-09 NOTE — ED Provider Notes (Signed)
CSN: [id number]     Arrival date & time 10/09/12  [id number] History   First MD Initiated Contact with Patient 10/09/12 [phone]     Chief Complaint  Patient presents with  . Headache  . Blurred Vision    HPI Pt was at work today when he suddenly felt chilled, dizzy and weak.  Pt is starting to feel better now.  It started about 1 am, got better a few hours later then returned around 6 or 7am.  He is a dialysis patient and just started last week.  He is due for dialysis today at noon.  No CP or shortness of breath.  No trouble with speech or coordination.  He had a headache earlier but that has resolved.  Right now the patient is feeling fine. EMS transported him. They did check his CBG and it was 123. Past Medical History  Diagnosis Date  . Eczema   . Morbid obesity   . Gout   . HTN (hypertension)   . Dyslipidemia   . Pulmonary embolism     3  in  lungs  at  one time...  . Abscess     great toe  . Pancreatitis   . Arthritis   . Depression   . Diabetes mellitus   . Anemia   . Shortness of breath     ?? chest  cold he has now  . Pneumonia     2-3 years ago  . Renal insufficiency     not on hd    Past Surgical History  Procedure Laterality Date  . None    . Av fistula placement Left 04/07/2012    Procedure: ARTERIOVENOUS (AV) FISTULA CREATION;  Surgeon: Angelia Mould, MD;  Location: [place];  Service: Vascular;  Laterality: Left;  . Ligation of competing branches of arteriovenous fistula Left 09/19/2012    Procedure: LIGATION OF COMPETING BRANCHES OF LEFT ARM ARTERIOVENOUS FISTULA; Vein angioplasty;  Surgeon: Angelia Mould, MD;  Location: [place];  Service: Vascular;  Laterality: Left;  . Insertion of dialysis catheter Right 09/19/2012    Procedure: INSERTION OF DIALYSIS CATHETER;  Surgeon: Angelia Mould, MD;  Location: [facility] OR;  Service: Vascular;  Laterality: Right;   Family History  Problem Relation Age of Onset  . Stroke Mother   . Diabetes Mother   .  Hypertension Mother   . Cancer    . Coronary artery disease    . Hypertension Father   . Colon cancer Maternal Grandmother 69  . Hypertension Brother    History  Substance Use Topics  . Smoking status: Never Smoker   . Smokeless tobacco: Never Used  . Alcohol Use: No     Comment:  24oz beer  per week    Review of Systems  All other systems reviewed and are negative.    Allergies  Review of patient's allergies indicates no known allergies.  Home Medications   Current Outpatient Rx  Name  Route  Sig  Dispense  Refill  . albuterol (PROVENTIL HFA;VENTOLIN HFA) 108 (90 BASE) MCG/ACT inhaler   Inhalation   Inhale 1 puff into the lungs every 6 (six) hours as needed for wheezing.         . calcitRIOL (ROCALTROL) 0.25 MCG capsule   Oral   Take 0.25 mcg by mouth daily.         . furosemide (LASIX) 80 MG tablet   Oral   Take 160 mg by mouth 2 (two) times daily.         Marland Kitchen  insulin aspart protamine- aspart (NOVOLOG MIX 70/30) (70-30) 100 UNIT/ML injection   Subcutaneous   Inject 30 Units into the skin 2 (two) times daily with a meal.         . labetalol (NORMODYNE) 200 MG tablet   Oral   Take 200 mg by mouth 2 (two) times daily.         Marland Kitchen oxyCODONE-acetaminophen (ROXICET) 5-325 MG per tablet   Oral   Take 1-2 tablets by mouth every 4 (four) hours as needed for pain.   20 tablet   0   . sodium bicarbonate 650 MG tablet   Oral   Take 325 mg by mouth daily.          Marland Kitchen warfarin (COUMADIN) 5 MG tablet   Oral   Take 5-10 mg by mouth daily. 5 mg on mon wed fri and 10 mg all other days         . HYDROcodone-acetaminophen (NORCO) 5-325 MG per tablet   Oral   Take 1 tablet by mouth every 6 (six) hours as needed for pain.   20 tablet   0    BP 183/91  Pulse 106  Temp(Src) 100.9 F (38.3 C) (Oral)  Resp 14  Ht 5\' 11"  (1.803 m)  Wt 266 lb (120.657 kg)  BMI 37.12 kg/m2  SpO2 100% Physical Exam  Nursing note and vitals reviewed. Constitutional: He  appears well-developed and well-nourished. No distress.  HENT:  Head: Normocephalic and atraumatic.  Right Ear: External ear normal.  Left Ear: External ear normal.  Eyes: Conjunctivae are normal. Right eye exhibits no discharge. Left eye exhibits no discharge. No scleral icterus.  Neck: Neck supple. No tracheal deviation present.  Cardiovascular: Normal rate, regular rhythm and intact distal pulses.   Pulmonary/Chest: Effort normal and breath sounds normal. No stridor. No respiratory distress. He has no wheezes. He has no rales.  Vascular catheter right chest wall without erythema or drainage  Abdominal: Soft. Bowel sounds are normal. He exhibits no distension. There is no tenderness. There is no rebound and no guarding.  Musculoskeletal: He exhibits no edema and no tenderness.  AV fistula left upper extremity, palpable thrill  Neurological: He is alert. He has normal strength. No sensory deficit. Cranial nerve deficit:  no gross defecits noted. He exhibits normal muscle tone. He displays no seizure activity. Coordination normal.  Skin: Skin is warm and dry. No rash noted.  Psychiatric: He has a normal mood and affect.    ED Course  Procedures (including critical care time) EKG Normal sinus rhythm Normal axis normal intervals ST elevation consistent with early repolarization changes in the anterior leads EKG is similar to prior EKG dated 05/04/2012  Labs Review Labs Reviewed  CBC - Abnormal; Notable for the following:    Hemoglobin 11.3 (*)    HCT 35.4 (*)    All other components within normal limits  COMPREHENSIVE METABOLIC PANEL - Abnormal; Notable for the following:    BUN 46 (*)    Creatinine, Ser 5.73 (*)    Calcium 7.4 (*)    Albumin 3.3 (*)    GFR calc non Af Amer 11 (*)    GFR calc Af Amer 13 (*)    All other components within normal limits  PROTIME-INR - Abnormal; Notable for the following:    Prothrombin Time 17.4 (*)    All other components within normal limits   CULTURE, BLOOD (ROUTINE X 2)  CULTURE, BLOOD (ROUTINE X 2)   Imaging  Review Dg Chest 2 View  10/09/2012   *RADIOLOGY REPORT*  Clinical Data: Headache, blurred vision and fever.  Dialysis patient.  CHEST - 2 VIEW  Comparison: 09/19/2012.  Findings: Split type catheter enters from the right with the tips in the region of the proximal and proximal to mid superior vena cava.  No pneumothorax.  Pulmonary vascular prominence most notable centrally without evidence of pulmonary edema or segmental infiltrate.  Tortuous aorta.  Heart slightly enlarged.  No plain film evidence of diskitis.  IMPRESSION: Pulmonary vascular prominence without pulmonary edema.  No segmental consolidation noted.  Minimal peribronchial thickening.  Tortuous aorta.  Heart slightly enlarged.   Original Report Authenticated By: Genia Del, M.D.    [name] second set of vitals is developed a temperature to 101.3. Will add on blood cultures. No other external signs of infection MDM   1. Fever    Patient presents to emergency room for fever without any other particular focal source of infection.  The patient has a supple neck. I doubt meningitis. He does have an indwelling vascular catheter in the right subclavian.  The patient is at risk for line infection. I discussed the case with Dr. Lorrene Reid, the patient has had blood cultures here in emergent arm and. He will be discharged to the dialysis center today. He will then receive IV antibiotics after dialysis.    Kathalene Frames, MD 10/09/12 [phone]

## 2012-10-09 NOTE — ED Notes (Signed)
Attempted to gain IV access X 2, unsuccessful. Another RN will attempt when pt returns from radiology.

## 2012-10-09 NOTE — ED Notes (Signed)
Pt c/o HA and blurred vision that started around 330am today, reports he is a new dialysis pt, goes MWF, last went on Friday. Pt denies CP/SOB. Pt reports normally when he gets a HA aleve works, he didn't take any today. Did take his meds this morning. Pt reports he is very thirsty and hunger, pt is DM reports last CBG was 126. Pt in nad, skin warm and dry, resp e/u.

## 2012-10-09 NOTE — ED Notes (Signed)
Steven Logan, in main lab sts there are a few machines down. Had to send CMP to [facility] for result.

## 2012-10-09 NOTE — ED Notes (Signed)
Pt undressed, in gown, on monitor, continuous pulse oximetry and blood pressure cuff 

## 2012-10-15 LAB — CULTURE, BLOOD (ROUTINE X 2)

## 2012-10-20 ENCOUNTER — Telehealth: Payer: Self-pay | Admitting: Vascular Surgery

## 2012-10-20 NOTE — Telephone Encounter (Addendum)
Message copied by Gena Fray on Fri Oct 20, 2012  9:19 AM ------      Message from: Denman George      Created: Thu Oct 19, 2012 10:11 AM      Regarding: FW: question of need for vasc study      Contact: [phone]       Please schedule duplex of right arm access and f/u visit with Dr. Scot Dock to evaluate if AVF ready to use.  (order to be placed later)            ----- Message -----         From: Angelia Mould, MD         Sent: 10/19/2012   9:41 AM           To: Lynetta Mare Pullins, RN      Subject: RE: question of need for vasc study                      Yes. Thanks. CD      ----- Message -----         From: Sherrye Payor, RN         Sent: 10/19/2012   9:03 AM           To: Angelia Mould, MD      Subject: question of need for vasc study                           Do you want an ultrasound of the access with office visit?            ----- Message -----         From: Angelia Mould, MD         Sent: 10/18/2012   5:57 PM           To: Lynetta Mare Pullins, RN      Subject: RE: recommended follow-up                                Should see him back before deciding when they can use his fistula. Thank you. CD      ----- Message -----         From: Sherrye Payor, RN         Sent: 10/18/2012   5:36 PM           To: Angelia Mould, MD      Subject: recommended follow-up                                    He is s/p Right IJ catheter placement, and Revision (L) R-C AVF with      Ligation of competing branches on 09/19/12.  He is inquiring about f/u, or when the dialysis center can stick his access.  Please advise.                          ------  Spoke with patient to schedule, dpm

## 2012-10-25 ENCOUNTER — Ambulatory Visit: Payer: BC Managed Care – PPO | Admitting: Vascular Surgery

## 2012-10-25 ENCOUNTER — Other Ambulatory Visit: Payer: Self-pay | Admitting: *Deleted

## 2012-10-25 ENCOUNTER — Other Ambulatory Visit (HOSPITAL_COMMUNITY): Payer: BC Managed Care – PPO

## 2012-10-25 DIAGNOSIS — T82598A Other mechanical complication of other cardiac and vascular devices and implants, initial encounter: Secondary | ICD-10-CM

## 2012-11-01 ENCOUNTER — Encounter (HOSPITAL_COMMUNITY): Payer: Self-pay | Admitting: Pharmacy Technician

## 2012-11-01 ENCOUNTER — Encounter (HOSPITAL_COMMUNITY): Payer: Self-pay | Admitting: *Deleted

## 2012-11-01 ENCOUNTER — Other Ambulatory Visit: Payer: Self-pay

## 2012-11-01 MED ORDER — DEXTROSE 5 % IV SOLN
1.5000 g | INTRAVENOUS | Status: DC
  Filled 2012-11-01: qty 1.5

## 2012-11-01 NOTE — Progress Notes (Signed)
11/01/12 1325  OBSTRUCTIVE SLEEP APNEA  Have you ever been diagnosed with sleep apnea through a sleep study? No  Do you snore loudly (loud enough to be heard through closed doors)?  1  Do you often feel tired, fatigued, or sleepy during the daytime? 1  Has anyone observed you stop breathing during your sleep? 0  Do you have, or are you being treated for high blood pressure? 1  BMI more than 35 kg/m2? 1  Age over 42 years old? 0  Neck circumference greater than 40 cm/18 inches? 0  Gender: 1  Obstructive Sleep Apnea Score 5  Score 4 or greater  Results sent to PCP

## 2012-11-02 ENCOUNTER — Encounter (HOSPITAL_COMMUNITY)
Admission: RE | Disposition: A | Discharge status: Home / Self Care | Payer: Self-pay | Source: Ambulatory Visit | Attending: Vascular Surgery

## 2012-11-02 ENCOUNTER — Ambulatory Visit (HOSPITAL_COMMUNITY)
Admission: RE | Admit: 2012-11-02 | Discharge: 2012-11-02 | Disposition: A | Discharge status: Home / Self Care | Payer: BC Managed Care – PPO | Source: Ambulatory Visit | Attending: Vascular Surgery | Admitting: Vascular Surgery

## 2012-11-02 ENCOUNTER — Ambulatory Visit (HOSPITAL_COMMUNITY): Payer: BC Managed Care – PPO | Admitting: Anesthesiology

## 2012-11-02 ENCOUNTER — Encounter (HOSPITAL_COMMUNITY): Payer: Self-pay | Admitting: *Deleted

## 2012-11-02 ENCOUNTER — Encounter (HOSPITAL_COMMUNITY): Payer: BC Managed Care – PPO | Admitting: Anesthesiology

## 2012-11-02 DIAGNOSIS — Z538 Procedure and treatment not carried out for other reasons: Secondary | ICD-10-CM | POA: Insufficient documentation

## 2012-11-02 LAB — GLUCOSE, CAPILLARY: Glucose-Capillary: 156 mg/dL — ABNORMAL HIGH (ref 70–99)

## 2012-11-02 LAB — POCT I-STAT 4, (NA,K, GLUC, HGB,HCT)
HCT: 35 % — ABNORMAL LOW (ref 39.0–52.0)
Potassium: 4.5 mEq/L (ref 3.5–5.1)
Sodium: 139 mEq/L (ref 135–145)

## 2012-11-02 LAB — PROTIME-INR: Prothrombin Time: 13.3 seconds (ref 11.6–15.2)

## 2012-11-02 SURGERY — CANCELLED PROCEDURE
Site: Neck | Laterality: Right

## 2012-11-02 MED ORDER — SODIUM CHLORIDE 0.9 % IV SOLN
INTRAVENOUS | Status: DC
  Administered 2012-11-02: 07:00:00 via INTRAVENOUS

## 2012-11-02 MED ORDER — LABETALOL HCL 200 MG PO TABS
200.0000 mg | ORAL_TABLET | Freq: Once | ORAL | Status: AC
  Administered 2012-11-02: 200 mg via ORAL
  Filled 2012-11-02: qty 1

## 2012-11-02 SURGICAL SUPPLY — 38 items
BAG DECANTER FOR FLEXI CONT (MISCELLANEOUS) ×2 IMPLANT
CATH CANNON HEMO 15F 50CM (CATHETERS) IMPLANT
CATH CANNON HEMO 15FR 19 (HEMODIALYSIS SUPPLIES) IMPLANT
CATH CANNON HEMO 15FR 23CM (HEMODIALYSIS SUPPLIES) IMPLANT
CATH CANNON HEMO 15FR 31CM (HEMODIALYSIS SUPPLIES) IMPLANT
CATH CANNON HEMO 15FR 32CM (HEMODIALYSIS SUPPLIES) IMPLANT
CHLORAPREP W/TINT 26ML (MISCELLANEOUS) ×2 IMPLANT
COVER PROBE W GEL 5X96 (DRAPES) IMPLANT
COVER SURGICAL LIGHT HANDLE (MISCELLANEOUS) ×2 IMPLANT
DERMABOND ADVANCED (GAUZE/BANDAGES/DRESSINGS) ×1
DERMABOND ADVANCED .7 DNX12 (GAUZE/BANDAGES/DRESSINGS) ×1 IMPLANT
DRAPE C-ARM 42X72 X-RAY (DRAPES) ×2 IMPLANT
DRAPE CHEST BREAST 15X10 FENES (DRAPES) ×2 IMPLANT
GAUZE SPONGE 2X2 8PLY STRL LF (GAUZE/BANDAGES/DRESSINGS) ×1 IMPLANT
GAUZE SPONGE 4X4 16PLY XRAY LF (GAUZE/BANDAGES/DRESSINGS) ×2 IMPLANT
GLOVE BIO SURGEON STRL SZ7.5 (GLOVE) ×2 IMPLANT
GLOVE BIOGEL PI IND STRL 8 (GLOVE) ×1 IMPLANT
GLOVE BIOGEL PI INDICATOR 8 (GLOVE) ×1
GOWN STRL NON-REIN LRG LVL3 (GOWN DISPOSABLE) ×6 IMPLANT
KIT BASIN OR (CUSTOM PROCEDURE TRAY) ×2 IMPLANT
KIT ROOM TURNOVER OR (KITS) ×2 IMPLANT
NEEDLE 18GX1X1/2 (RX/OR ONLY) (NEEDLE) ×2 IMPLANT
NEEDLE 22X1 1/2 (OR ONLY) (NEEDLE) ×2 IMPLANT
NEEDLE HYPO 25GX1X1/2 BEV (NEEDLE) ×2 IMPLANT
NS IRRIG 1000ML POUR BTL (IV SOLUTION) ×2 IMPLANT
PACK SURGICAL SETUP 50X90 (CUSTOM PROCEDURE TRAY) ×2 IMPLANT
PAD ARMBOARD 7.5X6 YLW CONV (MISCELLANEOUS) ×4 IMPLANT
SPONGE GAUZE 2X2 STER 10/PKG (GAUZE/BANDAGES/DRESSINGS) ×1
SUT ETHILON 3 0 PS 1 (SUTURE) ×2 IMPLANT
SUT VICRYL 4-0 PS2 18IN ABS (SUTURE) ×2 IMPLANT
SYR 20CC LL (SYRINGE) ×4 IMPLANT
SYR 30ML LL (SYRINGE) IMPLANT
SYR 5ML LL (SYRINGE) ×4 IMPLANT
SYR CONTROL 10ML LL (SYRINGE) ×2 IMPLANT
SYRINGE 10CC LL (SYRINGE) ×2 IMPLANT
TOWEL OR 17X24 6PK STRL BLUE (TOWEL DISPOSABLE) ×2 IMPLANT
TOWEL OR 17X26 10 PK STRL BLUE (TOWEL DISPOSABLE) ×2 IMPLANT
WATER STERILE IRR 1000ML POUR (IV SOLUTION) ×2 IMPLANT

## 2012-11-02 NOTE — Preoperative (Signed)
Beta Blockers   Reason not to administer Beta Blockers:Not Applicable 

## 2012-11-02 NOTE — Progress Notes (Signed)
Spoke with Dr. Scot Dock who stated patient did not need surgery today and could be discharged to home.

## 2012-11-02 NOTE — Anesthesia Preprocedure Evaluation (Signed)
Anesthesia Evaluation  Patient identified by MRN, date of birth, ID band Patient awake    Reviewed: Allergy & Precautions, H&P , NPO status , Patient's Chart, lab work & pertinent test results  History of Anesthesia Complications Negative for: history of anesthetic complications  Airway Mallampati: I TM Distance: >3 FB Neck ROM: Full    Dental   Pulmonary neg shortness of breath, pneumonia -, resolved,  pe in 2011  breath sounds clear to auscultation        Cardiovascular Exercise Tolerance: Good hypertension, Pt. on medications and Pt. on home beta blockers Rhythm:regular Rate:Normal  ekg - nsr    Neuro/Psych PSYCHIATRIC DISORDERS    GI/Hepatic Hx pancreatitis    Endo/Other  diabetes, Well Controlled, Type 2, Insulin Dependent  Renal/GU ESRF and DialysisRenal diseaseHas not started dialysis yet      Musculoskeletal   Abdominal   Peds  Hematology   Anesthesia Other Findings   Reproductive/Obstetrics                           Anesthesia Physical Anesthesia Plan  ASA: III  Anesthesia Plan: MAC   Post-op Pain Management:    Induction:   Airway Management Planned:   Additional Equipment:   Intra-op Plan:   Post-operative Plan:   Informed Consent:   Plan Discussed with: CRNA and Surgeon  Anesthesia Plan Comments:         Anesthesia Quick Evaluation

## 2012-11-14 ENCOUNTER — Encounter: Payer: Self-pay | Admitting: Vascular Surgery

## 2012-11-15 ENCOUNTER — Encounter: Payer: Self-pay | Admitting: Vascular Surgery

## 2012-11-15 ENCOUNTER — Encounter (INDEPENDENT_AMBULATORY_CARE_PROVIDER_SITE_OTHER): Payer: Self-pay

## 2012-11-15 ENCOUNTER — Ambulatory Visit (HOSPITAL_COMMUNITY)
Admission: RE | Admit: 2012-11-15 | Discharge: 2012-11-15 | Disposition: A | Discharge status: Home / Self Care | Payer: BC Managed Care – PPO | Source: Ambulatory Visit | Attending: Vascular Surgery | Admitting: Vascular Surgery

## 2012-11-15 ENCOUNTER — Ambulatory Visit (INDEPENDENT_AMBULATORY_CARE_PROVIDER_SITE_OTHER): Payer: BC Managed Care – PPO | Admitting: Vascular Surgery

## 2012-11-15 ENCOUNTER — Ambulatory Visit: Payer: BC Managed Care – PPO | Admitting: Vascular Surgery

## 2012-11-15 ENCOUNTER — Other Ambulatory Visit (HOSPITAL_COMMUNITY): Payer: BC Managed Care – PPO

## 2012-11-15 VITALS — BP 161/90 | HR 79 | Resp 18 | Ht 73.0 in | Wt 264.0 lb

## 2012-11-15 DIAGNOSIS — T82598A Other mechanical complication of other cardiac and vascular devices and implants, initial encounter: Secondary | ICD-10-CM | POA: Insufficient documentation

## 2012-11-15 DIAGNOSIS — N186 End stage renal disease: Secondary | ICD-10-CM

## 2012-11-15 DIAGNOSIS — Y849 Medical procedure, unspecified as the cause of abnormal reaction of the patient, or of later complication, without mention of misadventure at the time of the procedure: Secondary | ICD-10-CM | POA: Insufficient documentation

## 2012-11-15 NOTE — Progress Notes (Signed)
Vascular and Vein Specialist of [facility]  Patient name: Steven Logan MRN: [id number] DOB: 01/03/1971 Sex: male  REASON FOR VISIT: Follow up of left radiocephalic AV fistula.  HPI: Steven Logan is a 42 y.o. male had a left radiocephalic fistula placed on 04/07/2012. On 08/07/2012, he had a fistulogram which showed some slight irregularity beyond the anastomosis in the proximal fistula however this did not appear to be producing a significant stenosis. There was one large competing branches the proximal fistula and a smaller competing branches above the antecubital level. On 09/19/2012 he had placement of her right IJ tunneled dialysis catheter and ligation of competing branches of his left radiocephalic AV fistula. In addition I passed a 5 mm dilator through the area of stenosis in the proximal fistula. This was done through a branch. Comes in today to have his fistula check. He dialyzes Monday Wednesdays and Fridays. He is on Coumadin.  Past Medical History  Diagnosis Date  . Eczema   . Morbid obesity   . Gout   . HTN (hypertension)   . Dyslipidemia   . Pulmonary embolism     3  in  lungs  at  one time...  . Abscess     great toe  . Pancreatitis   . Arthritis   . Depression   . Diabetes mellitus   . Anemia   . Pneumonia     2-3 years ago  . Shortness of breath     ?? chest  cold he has now.  10/8- no SOB  . Renal hypertension complicating pregnancy, childbirth and the puerperium with postnatal complication     Hemo started  Sept 2014- MWF   Family History  Problem Relation Age of Onset  . Stroke Mother   . Diabetes Mother   . Hypertension Mother   . Cancer    . Coronary artery disease    . Hypertension Father   . Colon cancer Maternal Grandmother 61  . Hypertension Brother    SOCIAL HISTORY: History  Substance Use Topics  . Smoking status: Never Smoker   . Smokeless tobacco: Never Used  . Alcohol Use: No     Comment:  24oz beer  per week.  10/8 no drinking    No Known Allergies Current Outpatient Prescriptions  Medication Sig Dispense Refill  . albuterol (PROVENTIL HFA;VENTOLIN HFA) 108 (90 BASE) MCG/ACT inhaler Inhale 1 puff into the lungs every 6 (six) hours as needed for wheezing or shortness of breath.       . calcitRIOL (ROCALTROL) 0.25 MCG capsule Take 0.25 mcg by mouth daily.      . furosemide (LASIX) 80 MG tablet Take 160 mg by mouth 2 (two) times daily.      Marland Kitchen HYDROcodone-acetaminophen (NORCO) 5-325 MG per tablet Take 1 tablet by mouth every 6 (six) hours as needed for pain.  20 tablet  0  . insulin aspart protamine- aspart (NOVOLOG MIX 70/30) (70-30) 100 UNIT/ML injection Inject 30 Units into the skin 2 (two) times daily with a meal.      . labetalol (NORMODYNE) 200 MG tablet Take 200 mg by mouth 2 (two) times daily.      Marland Kitchen oxyCODONE-acetaminophen (ROXICET) 5-325 MG per tablet Take 1-2 tablets by mouth every 4 (four) hours as needed for pain.  20 tablet  0  . sodium bicarbonate 650 MG tablet Take 325 mg by mouth daily.       Marland Kitchen warfarin (COUMADIN) 5 MG tablet Take 5-10 mg by  mouth daily. 5 mg on mon wed fri and 10 mg all other days       No current facility-administered medications for this visit.   REVIEW OF SYSTEMS: Valu.Nieves ] denotes positive finding; [  ] denotes negative finding  CARDIOVASCULAR:  [ ]  chest pain   [ ]  chest pressure   [ ]  palpitations   [ ]  orthopnea   [ ]  dyspnea on exertion   [ ]  claudication   [ ]  rest pain   [ ]  DVT   [ ]  phlebitis PULMONARY:   [ ]  productive cough   [ ]  asthma   [ ]  wheezing NEUROLOGIC:   [ ]  weakness  [ ]  paresthesias  [ ]  aphasia  [ ]  amaurosis  [ ]  dizziness HEMATOLOGIC:   [ ]  bleeding problems   [ ]  clotting disorders MUSCULOSKELETAL:  [ ]  joint pain   [ ]  joint swelling [ ]  leg swelling GASTROINTESTINAL: [ ]   blood in stool  [ ]   hematemesis GENITOURINARY:  [ ]   dysuria  [ ]   hematuria PSYCHIATRIC:  [ ]  history of major depression INTEGUMENTARY:  [ ]  rashes  [ ]  ulcers CONSTITUTIONAL:  [ ]   fever   [ ]  chills  PHYSICAL EXAM: Filed Vitals:   11/15/12 1203  BP: 161/90  Pulse: 79  Resp: 18  Height: 6\' 1"  (1.854 m)  Weight: 264 lb (119.75 kg)   Body mass index is 34.84 kg/(m^2). GENERAL: The patient is a well-nourished male, in no acute distress. The vital signs are documented above. CARDIOVASCULAR: There is a regular rate and rhythm.  PULMONARY: There is good air exchange bilaterally without wheezing or rales. His left radiocephalic fistula has an excellent thrill. It is not especially pulsatile.  DATA:  Duplex scan that shows an elevated velocities in the area that was dilated. However the fistulas not really pulsatile proximal to this and this may be simply related to a change in diameter of the vein.  I reviewed his previous vein mapping which shows that the left forearm cephalic vein empties into the basilic system. The upper arm cephalic vein on the left was not visualized on a preoperative vein mapping. Basilic vein appeared to be reasonable in the left although it emptied into the brachial system early.  MEDICAL ISSUES: I think it would be reasonable to attempt using the left radiocephalic fistula. If this is not successful then the only remaining option would be to cannulate the fistula above the area of stenosis and attempt to dilate the area of increased velocities were previously had been able to pass a 5 mm dilator without much difficulty. He'll notify us if there is any problems with dialysis using the fistula. We will not arrange to remove his catheter to which sure that the fistula is working well.  [place] [facility] [place] Beeper: [phone]

## 2012-11-29 ENCOUNTER — Encounter: Payer: Self-pay | Admitting: Medical

## 2012-12-06 ENCOUNTER — Encounter: Payer: Self-pay | Admitting: Medical

## 2012-12-15 ENCOUNTER — Ambulatory Visit: Payer: BC Managed Care – PPO | Admitting: Cardiology

## 2013-01-02 ENCOUNTER — Ambulatory Visit: Payer: BC Managed Care – PPO | Admitting: Cardiology

## 2013-02-13 ENCOUNTER — Ambulatory Visit: Payer: BC Managed Care – PPO | Admitting: Cardiology

## 2013-03-01 ENCOUNTER — Ambulatory Visit: Payer: BC Managed Care – PPO | Admitting: Cardiology

## 2013-03-16 ENCOUNTER — Encounter: Payer: Self-pay | Admitting: Cardiology

## 2013-05-22 ENCOUNTER — Emergency Department (HOSPITAL_COMMUNITY)
Admission: EM | Admit: 2013-05-22 | Discharge: 2013-05-23 | Disposition: A | Discharge status: Home / Self Care | Payer: BC Managed Care – PPO | Attending: Emergency Medicine | Admitting: Emergency Medicine

## 2013-05-22 ENCOUNTER — Encounter (HOSPITAL_COMMUNITY): Payer: Self-pay | Admitting: Emergency Medicine

## 2013-05-22 DIAGNOSIS — I10 Essential (primary) hypertension: Secondary | ICD-10-CM | POA: Insufficient documentation

## 2013-05-22 DIAGNOSIS — Z8659 Personal history of other mental and behavioral disorders: Secondary | ICD-10-CM | POA: Insufficient documentation

## 2013-05-22 DIAGNOSIS — Z794 Long term (current) use of insulin: Secondary | ICD-10-CM | POA: Insufficient documentation

## 2013-05-22 DIAGNOSIS — Z79899 Other long term (current) drug therapy: Secondary | ICD-10-CM | POA: Insufficient documentation

## 2013-05-22 DIAGNOSIS — E119 Type 2 diabetes mellitus without complications: Secondary | ICD-10-CM | POA: Insufficient documentation

## 2013-05-22 DIAGNOSIS — Z86711 Personal history of pulmonary embolism: Secondary | ICD-10-CM | POA: Insufficient documentation

## 2013-05-22 DIAGNOSIS — Z872 Personal history of diseases of the skin and subcutaneous tissue: Secondary | ICD-10-CM | POA: Insufficient documentation

## 2013-05-22 DIAGNOSIS — Z791 Long term (current) use of non-steroidal anti-inflammatories (NSAID): Secondary | ICD-10-CM | POA: Insufficient documentation

## 2013-05-22 DIAGNOSIS — Z8719 Personal history of other diseases of the digestive system: Secondary | ICD-10-CM | POA: Insufficient documentation

## 2013-05-22 DIAGNOSIS — Z8701 Personal history of pneumonia (recurrent): Secondary | ICD-10-CM | POA: Insufficient documentation

## 2013-05-22 DIAGNOSIS — Z862 Personal history of diseases of the blood and blood-forming organs and certain disorders involving the immune mechanism: Secondary | ICD-10-CM | POA: Insufficient documentation

## 2013-05-22 DIAGNOSIS — M7989 Other specified soft tissue disorders: Secondary | ICD-10-CM | POA: Insufficient documentation

## 2013-05-22 DIAGNOSIS — N189 Chronic kidney disease, unspecified: Secondary | ICD-10-CM | POA: Insufficient documentation

## 2013-05-22 DIAGNOSIS — M109 Gout, unspecified: Secondary | ICD-10-CM | POA: Insufficient documentation

## 2013-05-22 MED ORDER — OXYCODONE-ACETAMINOPHEN 5-325 MG PO TABS
1.0000 | ORAL_TABLET | Freq: Once | ORAL | Status: AC
  Administered 2013-05-23: 1 via ORAL
  Filled 2013-05-22: qty 1

## 2013-05-22 NOTE — ED Notes (Signed)
Pt reports R ankle swelling that started last Sunday and swelling has increased and become painful. Pt walks with a limp d/t to pain during walking. Pt states he is a dialysis pt and last treatment was Monday. Pt alert in triage area. Pt denies any injury to area. R ankle with non pitting edema. Pulses to R ankle palpable.

## 2013-05-23 MED ORDER — OXYCODONE-ACETAMINOPHEN 5-325 MG PO TABS: 1.0000 | ORAL_TABLET | ORAL | Status: DC | PRN

## 2013-05-23 MED ORDER — INDOMETHACIN 25 MG PO CAPS: 25.0000 mg | ORAL_CAPSULE | Freq: Three times a day (TID) | ORAL | Status: DC | PRN

## 2013-05-23 NOTE — Discharge Instructions (Signed)

## 2013-05-23 NOTE — ED Provider Notes (Signed)
CSN: [id number]     Arrival date & time 05/22/13  2233 History   First MD Initiated Contact with Patient 05/22/13 2309     Chief Complaint  Patient presents with  . Ankle Pain     (Consider location/radiation/quality/duration/timing/severity/associated sxs/prior Treatment) [name]. male presenting with ankle pain. The history is provided by the patient. No language interpreter was used.  Ankle Pain Location:  Ankle Ankle location:  R ankle Pain details:    Quality:  Aching Associated symptoms: no fever   Associated symptoms comment:  Red, painful swelling to right ankle without known injury. He reports a history of gout. He was also concerned that the discomfort seems to radiate into calf and to thigh. No SOB or chest pain.   Past Medical History  Diagnosis Date  . Eczema   . Morbid obesity   . Gout   . HTN (hypertension)   . Dyslipidemia   . Pulmonary embolism     3  in  lungs  at  one time...  . Abscess     great toe  . Pancreatitis   . Arthritis   . Depression   . Diabetes mellitus   . Anemia   . Pneumonia     2-3 years ago  . Shortness of breath     ?? chest  cold he has now.  10/8- no SOB  . Renal hypertension complicating pregnancy, childbirth and the puerperium with postnatal complication     Hemo started  Sept 2014- MWF   Past Surgical History  Procedure Laterality Date  . None    . Av fistula placement Left 04/07/2012    Procedure: ARTERIOVENOUS (AV) FISTULA CREATION;  Surgeon: Angelia Mould, MD;  Location: [place];  Service: Vascular;  Laterality: Left;  . Ligation of competing branches of arteriovenous fistula Left 09/19/2012    Procedure: LIGATION OF COMPETING BRANCHES OF LEFT ARM ARTERIOVENOUS FISTULA; Vein angioplasty;  Surgeon: Angelia Mould, MD;  Location: [place];  Service: Vascular;  Laterality: Left;  . Insertion of dialysis catheter Right 09/19/2012    Procedure: INSERTION OF DIALYSIS CATHETER;  Surgeon: Angelia Mould, MD;  Location: [facility] OR;  Service: Vascular;  Laterality: Right;   Family History  Problem Relation Age of Onset  . Stroke Mother   . Diabetes Mother   . Hypertension Mother   . Cancer    . Coronary artery disease    . Hypertension Father   . Colon cancer Maternal Grandmother 44  . Hypertension Brother    History  Substance Use Topics  . Smoking status: Never Smoker   . Smokeless tobacco: Never Used  . Alcohol Use: No     Comment:  24oz beer  per week.  10/8 no drinking    Review of Systems  Constitutional: Negative for fever.  Respiratory: Negative for shortness of breath.   Cardiovascular: Positive for leg swelling. Negative for chest pain.  Musculoskeletal:       See HPI.      Allergies  Review of patient's allergies indicates no known allergies.  Home Medications   Prior to Admission medications   Medication Sig Start Date End Date Taking? Authorizing Provider  ibuprofen (ADVIL,MOTRIN) 200 MG tablet Take 200 mg by mouth every 6 (six) hours as needed for moderate pain.   Yes Historical Provider, MD  insulin aspart protamine- aspart (NOVOLOG MIX 70/30) (70-30) 100 UNIT/ML injection Inject 30 Units into the skin 2 (two) times daily with  a meal. 05/06/12  Yes Estela Leonie Green, MD  labetalol (NORMODYNE) 200 MG tablet Take 200 mg by mouth 2 (two) times daily.   Yes Historical Provider, MD  naproxen sodium (ANAPROX) 220 MG tablet Take 660 mg by mouth 2 (two) times daily as needed (pain).   Yes Historical Provider, MD  PRESCRIPTION MEDICATION Take 1 tablet by mouth See admin instructions. Ranvella 800mg : 1 tablet three times daily with each meal and with each snack (usually 3 per day).   Yes Historical Provider, MD  albuterol (PROVENTIL HFA;VENTOLIN HFA) 108 (90 BASE) MCG/ACT inhaler Inhale 1 puff into the lungs every 6 (six) hours as needed for wheezing or shortness of breath.     Historical Provider, MD   BP 160/71  Pulse 111  Temp(Src) 98.3 F (36.8 C)  (Oral)  Resp 18  Ht 6' (1.829 m)  Wt 250 lb (113.399 kg)  BMI 33.90 kg/m2  SpO2 98% Physical Exam  Constitutional: He is oriented to person, place, and time. He appears well-developed and well-nourished.  Neck: Normal range of motion.  Cardiovascular: Intact distal pulses.   Pulmonary/Chest: Effort normal.  Musculoskeletal: Normal range of motion.  Right ankle moderately swollen with mild erythema. FROM. Joint stable. No foot or calf swelling. There is mild calf tenderness that extends into medial thigh.   Neurological: He is alert and oriented to person, place, and time.  Skin: Skin is warm and dry.  Psychiatric: He has a normal mood and affect.    ED Course  Procedures (including critical care time) Labs Review Labs Reviewed - No data to display  Imaging Review No results found.   EKG Interpretation None      MDM   Final diagnoses:  None    1. Gouty arthritis  Suspect gout arthritis of ankle c/w history. Concern for DVT is low but patient is at risk. Will have doppler performed tomorrow but Lovenox not given due to low suspicion for DVT.    Dewaine Oats, PA-C 05/26/13 0136

## 2013-05-26 NOTE — ED Provider Notes (Signed)
Medical screening examination/treatment/procedure(s) were performed by non-physician practitioner and as supervising physician I was immediately available for consultation/collaboration.   EKG Interpretation None        Julianne Rice, MD 05/26/13 [phone]

## 2013-05-29 ENCOUNTER — Emergency Department (HOSPITAL_COMMUNITY): Payer: BC Managed Care – PPO

## 2013-05-29 ENCOUNTER — Encounter (HOSPITAL_COMMUNITY): Payer: Self-pay | Admitting: Emergency Medicine

## 2013-05-29 ENCOUNTER — Emergency Department (HOSPITAL_COMMUNITY)
Admission: EM | Admit: 2013-05-29 | Discharge: 2013-05-29 | Disposition: A | Discharge status: Home / Self Care | Payer: BC Managed Care – PPO | Attending: Emergency Medicine | Admitting: Emergency Medicine

## 2013-05-29 DIAGNOSIS — R109 Unspecified abdominal pain: Secondary | ICD-10-CM

## 2013-05-29 DIAGNOSIS — M109 Gout, unspecified: Secondary | ICD-10-CM

## 2013-05-29 DIAGNOSIS — Z992 Dependence on renal dialysis: Secondary | ICD-10-CM | POA: Insufficient documentation

## 2013-05-29 DIAGNOSIS — L0390 Cellulitis, unspecified: Secondary | ICD-10-CM

## 2013-05-29 DIAGNOSIS — L0291 Cutaneous abscess, unspecified: Secondary | ICD-10-CM | POA: Insufficient documentation

## 2013-05-29 DIAGNOSIS — R0602 Shortness of breath: Secondary | ICD-10-CM | POA: Insufficient documentation

## 2013-05-29 DIAGNOSIS — F3289 Other specified depressive episodes: Secondary | ICD-10-CM | POA: Insufficient documentation

## 2013-05-29 DIAGNOSIS — D649 Anemia, unspecified: Secondary | ICD-10-CM | POA: Insufficient documentation

## 2013-05-29 DIAGNOSIS — Z8701 Personal history of pneumonia (recurrent): Secondary | ICD-10-CM | POA: Insufficient documentation

## 2013-05-29 DIAGNOSIS — I120 Hypertensive chronic kidney disease with stage 5 chronic kidney disease or end stage renal disease: Secondary | ICD-10-CM | POA: Insufficient documentation

## 2013-05-29 DIAGNOSIS — K859 Acute pancreatitis without necrosis or infection, unspecified: Secondary | ICD-10-CM | POA: Insufficient documentation

## 2013-05-29 DIAGNOSIS — R911 Solitary pulmonary nodule: Secondary | ICD-10-CM | POA: Insufficient documentation

## 2013-05-29 DIAGNOSIS — Z86711 Personal history of pulmonary embolism: Secondary | ICD-10-CM | POA: Insufficient documentation

## 2013-05-29 DIAGNOSIS — F329 Major depressive disorder, single episode, unspecified: Secondary | ICD-10-CM | POA: Insufficient documentation

## 2013-05-29 DIAGNOSIS — N186 End stage renal disease: Secondary | ICD-10-CM | POA: Insufficient documentation

## 2013-05-29 DIAGNOSIS — Z79899 Other long term (current) drug therapy: Secondary | ICD-10-CM | POA: Insufficient documentation

## 2013-05-29 DIAGNOSIS — L259 Unspecified contact dermatitis, unspecified cause: Secondary | ICD-10-CM | POA: Insufficient documentation

## 2013-05-29 DIAGNOSIS — E119 Type 2 diabetes mellitus without complications: Secondary | ICD-10-CM | POA: Insufficient documentation

## 2013-05-29 HISTORY — DX: Hypertensive chronic kidney disease with stage 1 through stage 4 chronic kidney disease, or unspecified chronic kidney disease: I12.9

## 2013-05-29 LAB — CBC WITH DIFFERENTIAL/PLATELET
Basophils Absolute: 0 10*3/uL (ref 0.0–0.1)
Basophils Relative: 0 % (ref 0–1)
Eosinophils Absolute: 0 10*3/uL (ref 0.0–0.7)
Eosinophils Relative: 0 % (ref 0–5)
HCT: 29.5 % — ABNORMAL LOW (ref 39.0–52.0)
Hemoglobin: 9.3 g/dL — ABNORMAL LOW (ref 13.0–17.0)
LYMPHS PCT: 7 % — AB (ref 12–46)
Lymphs Abs: 1.4 10*3/uL (ref 0.7–4.0)
MCH: 26.6 pg (ref 26.0–34.0)
MCHC: 31.5 g/dL (ref 30.0–36.0)
MCV: 84.3 fL (ref 78.0–100.0)
MONOS PCT: 5 % (ref 3–12)
Monocytes Absolute: 1.2 10*3/uL — ABNORMAL HIGH (ref 0.1–1.0)
NEUTROS ABS: 19.1 10*3/uL — AB (ref 1.7–7.7)
NEUTROS PCT: 88 % — AB (ref 43–77)
PLATELETS: 404 10*3/uL — AB (ref 150–400)
RBC: 3.5 MIL/uL — AB (ref 4.22–5.81)
RDW: 15.5 % (ref 11.5–15.5)
WBC: 21.7 10*3/uL — AB (ref 4.0–10.5)

## 2013-05-29 LAB — URINALYSIS, ROUTINE W REFLEX MICROSCOPIC
BILIRUBIN URINE: NEGATIVE
Glucose, UA: 250 mg/dL — AB
Hgb urine dipstick: NEGATIVE
KETONES UR: NEGATIVE mg/dL
LEUKOCYTES UA: NEGATIVE
Nitrite: NEGATIVE
PH: 7.5 (ref 5.0–8.0)
PROTEIN: 100 mg/dL — AB
Specific Gravity, Urine: 1.018 (ref 1.005–1.030)
Urobilinogen, UA: 1 mg/dL (ref 0.0–1.0)

## 2013-05-29 LAB — URINE MICROSCOPIC-ADD ON

## 2013-05-29 LAB — COMPREHENSIVE METABOLIC PANEL
ALK PHOS: 101 U/L (ref 39–117)
ALT: 9 U/L (ref 0–53)
AST: 8 U/L (ref 0–37)
Albumin: 2.3 g/dL — ABNORMAL LOW (ref 3.5–5.2)
BILIRUBIN TOTAL: 0.3 mg/dL (ref 0.3–1.2)
BUN: 46 mg/dL — AB (ref 6–23)
CHLORIDE: 96 meq/L (ref 96–112)
CO2: 25 mEq/L (ref 19–32)
Calcium: 8.7 mg/dL (ref 8.4–10.5)
Creatinine, Ser: 5.18 mg/dL — ABNORMAL HIGH (ref 0.50–1.35)
GFR calc Af Amer: 14 mL/min — ABNORMAL LOW (ref >90)
GFR calc non Af Amer: 12 mL/min — ABNORMAL LOW (ref >90)
GLUCOSE: 343 mg/dL — AB (ref 70–99)
POTASSIUM: 3.8 meq/L (ref 3.7–5.3)
SODIUM: 139 meq/L (ref 137–147)
TOTAL PROTEIN: 7.9 g/dL (ref 6.0–8.3)

## 2013-05-29 LAB — I-STAT CG4 LACTIC ACID, ED: Lactic Acid, Venous: 1.93 mmol/L (ref 0.5–2.2)

## 2013-05-29 MED ORDER — OXYCODONE-ACETAMINOPHEN 5-325 MG PO TABS
1.0000 | ORAL_TABLET | Freq: Once | ORAL | Status: AC
  Administered 2013-05-29: 1 via ORAL
  Filled 2013-05-29: qty 1

## 2013-05-29 MED ORDER — HYDROMORPHONE HCL PF 1 MG/ML IJ SOLN: 1.0000 mg | Freq: Once | INTRAMUSCULAR | Status: DC

## 2013-05-29 MED ORDER — ONDANSETRON HCL 4 MG/2ML IJ SOLN: 4.0000 mg | Freq: Once | INTRAMUSCULAR | Status: DC

## 2013-05-29 MED ORDER — HYDROMORPHONE HCL PF 1 MG/ML IJ SOLN
1.0000 mg | Freq: Once | INTRAMUSCULAR | Status: AC
  Administered 2013-05-29: 1 mg via INTRAVENOUS
  Filled 2013-05-29: qty 1

## 2013-05-29 MED ORDER — OXYCODONE-ACETAMINOPHEN 5-325 MG PO TABS: 1.0000 | ORAL_TABLET | Freq: Four times a day (QID) | ORAL | Status: DC | PRN

## 2013-05-29 NOTE — ED Notes (Signed)
MD at bedside. 

## 2013-05-29 NOTE — ED Notes (Signed)
Pink arm band placed on patient's left wrist

## 2013-05-29 NOTE — ED Provider Notes (Signed)
CSN: [id number]     Arrival date & time 05/29/13  1326 History   First MD Initiated Contact with Patient 05/29/13 1530     Chief Complaint  Patient presents with  . Flank Pain     (Consider location/radiation/quality/duration/timing/severity/associated sxs/prior Treatment) HPI  This a 43 year old male with history of end-stage renal disease, gout, hypertension, pulmonary embolism, diabetes who presents with left flank pain. Patient reports left flank pain that started yesterday. He states the sharp and radiates across his abdomen. He does produce urine but denies any urinary symptoms including dysuria or frequency or hematuria. He's never had pain like this before. He denies any injury. He denies any nausea, vomiting, or diarrhea. Patient also reports continued right foot and right hand pain. He was diagnosed with gout and discharged on prednisone and oxycodone. He states that those are not helping. Currently patient rates his pain at 8/10. He denies any fevers or shortness breath, chest pain.  Past Medical History  Diagnosis Date  . Eczema   . Morbid obesity   . Gout   . HTN (hypertension)   . Dyslipidemia   . Pulmonary embolism     3  in  lungs  at  one time...  . Abscess     great toe  . Pancreatitis   . Arthritis   . Depression   . Diabetes mellitus   . Anemia   . Pneumonia     2-3 years ago  . Shortness of breath     ?? chest  cold he has now.  10/8- no SOB  . Renal hypertension     Hemo started  Sept 2014- MWF   Past Surgical History  Procedure Laterality Date  . None    . Av fistula placement Left 04/07/2012    Procedure: ARTERIOVENOUS (AV) FISTULA CREATION;  Surgeon: Angelia Mould, MD;  Location: [place];  Service: Vascular;  Laterality: Left;  . Ligation of competing branches of arteriovenous fistula Left 09/19/2012    Procedure: LIGATION OF COMPETING BRANCHES OF LEFT ARM ARTERIOVENOUS FISTULA; Vein angioplasty;  Surgeon: Angelia Mould, MD;  Location: [place];  Service: Vascular;  Laterality: Left;  . Insertion of dialysis catheter Right 09/19/2012    Procedure: INSERTION OF DIALYSIS CATHETER;  Surgeon: Angelia Mould, MD;  Location: [facility] OR;  Service: Vascular;  Laterality: Right;   Family History  Problem Relation Age of Onset  . Stroke Mother   . Diabetes Mother   . Hypertension Mother   . Cancer    . Coronary artery disease    . Hypertension Father   . Colon cancer Maternal Grandmother 55  . Hypertension Brother    History  Substance Use Topics  . Smoking status: Never Smoker   . Smokeless tobacco: Never Used  . Alcohol Use: No     Comment:  24oz beer  per week.  10/8 no drinking    Review of Systems  Constitutional: Negative.  Negative for fever.  Respiratory: Negative.  Negative for chest tightness and shortness of breath.   Cardiovascular: Negative.  Negative for chest pain.  Gastrointestinal: Negative.  Negative for nausea, vomiting, abdominal pain and diarrhea.  Genitourinary: Positive for flank pain. Negative for dysuria, frequency and hematuria.  Musculoskeletal:       Right hand and foot pain  Skin: Negative for rash.  Neurological: Negative for headaches.  All other systems reviewed and are negative.     Allergies  Review of patient's allergies indicates no known  allergies.  Home Medications   Prior to Admission medications   Medication Sig Start Date End Date Taking? Authorizing Provider  labetalol (NORMODYNE) 200 MG tablet Take 200 mg by mouth 2 (two) times daily.   Yes Historical Provider, MD  oxyCODONE-acetaminophen (PERCOCET/ROXICET) 5-325 MG per tablet Take 1-2 tablets by mouth every 4 (four) hours as needed for severe pain. 05/23/13  Yes Shari A Upstill, PA-C  sevelamer carbonate (RENVELA) 800 MG tablet Take 800 mg by mouth 3 (three) times daily with meals.   Yes Historical Provider, MD   BP 185/71  Pulse 92  Temp(Src) 98.8 F (37.1 C) (Oral)  Resp 16  Wt 251 lb (113.853 kg)  SpO2  100% Physical Exam  Nursing note and vitals reviewed. Constitutional: He is oriented to person, place, and time.  Overweight, no acute distress  HENT:  Head: Normocephalic and atraumatic.  Mouth/Throat: Oropharynx is clear and moist.  Cardiovascular: Normal rate, regular rhythm and normal heart sounds.   No murmur heard. Pulmonary/Chest: Effort normal and breath sounds normal. No respiratory distress. He has no wheezes.  Abdominal: Soft. Bowel sounds are normal. There is tenderness. There is no rebound.  Left flank and CVA tenderness to palpation, no rebound or guarding  Musculoskeletal: He exhibits no edema.  Decreased range of motion of the right wrist, fusiform swelling of all 5 digits and the wrist, no overlying skin changes mild warmth noted to the joint.  Right ankle swelling noted, no overlying skin erythema, there is a area of skin breakdown laterally  Lymphadenopathy:    He has no cervical adenopathy.  Neurological: He is alert and oriented to person, place, and time.  Skin: Skin is warm and dry.  Psychiatric: He has a normal mood and affect.    ED Course  Procedures (including critical care time)  Angiocath insertion Performed by: Merryl Hacker  Consent: Verbal consent obtained. Risks and benefits: risks, benefits and alternatives were discussed Time out: Immediately prior to procedure a "time out" was called to verify the correct patient, procedure, equipment, support staff and site/side marked as required.  Preparation: Patient was prepped and draped in the usual sterile fashion.  Vein Location: left anticub  Ultrasound Guided  Gauge: 20 g  Normal blood return and flush without difficulty Patient tolerance: Patient tolerated the procedure well with no immediate complications.     Labs Review Labs Reviewed  CBC WITH DIFFERENTIAL - Abnormal; Notable for the following:    WBC 21.7 (*)    RBC 3.50 (*)    Hemoglobin 9.3 (*)    HCT 29.5 (*)    Platelets  404 (*)    Neutrophils Relative % 88 (*)    Neutro Abs 19.1 (*)    Lymphocytes Relative 7 (*)    Monocytes Absolute 1.2 (*)    All other components within normal limits  COMPREHENSIVE METABOLIC PANEL - Abnormal; Notable for the following:    Glucose, Bld 343 (*)    BUN 46 (*)    Creatinine, Ser 5.18 (*)    Albumin 2.3 (*)    GFR calc non Af Amer 12 (*)    GFR calc Af Amer 14 (*)    All other components within normal limits  URINALYSIS, ROUTINE W REFLEX MICROSCOPIC - Abnormal; Notable for the following:    Glucose, UA 250 (*)    Protein, ur 100 (*)    All other components within normal limits  URINE MICROSCOPIC-ADD ON  URIC ACID  I-STAT CG4 LACTIC ACID,  ED    Imaging Review Ct Abdomen Pelvis Wo Contrast  05/29/2013   CLINICAL DATA:  Left-sided abdominal pain  EXAM: CT CHEST, ABDOMEN AND PELVIS WITHOUT CONTRAST  TECHNIQUE: Multidetector CT imaging of the chest, abdomen and pelvis was performed following the standard protocol without IV contrast.  COMPARISON:  Prior ultrasound from 05/05/2011.  [facility]  FINDINGS: There is a loculated right pleural effusion along the lateral wall of the right hemi thorax. Associated round soft tissue opacity along the anterior aspect of this collection may reflect associated rounded atelectasis. The effusion itself measures simple fluid density. The visualized lung bases are otherwise clear. Mild cardiomegaly noted.  Limited noncontrast evaluation of the liver is unremarkable. Gallbladder within normal limits. No biliary ductal dilatation. The spleen, adrenal glands, and pancreas demonstrate a normal unenhanced appearance.  Probable small parapelvic cysts noted within the right kidney. 4 mm nonobstructive stone present within the interpolar right kidney. No hydronephrosis or evidence of obstructive uropathy. Mild perinephric fat stranding seen bilaterally.  No evidence of bowel obstruction. Stomach is within normal limits. No abnormal wall thickening or inflammatory  fat stranding seen about the bowels. Appendix is well visualized in the right lower quadrant and is of normal caliber and appearance without associated inflammatory changes to suggest acute appendicitis.  Mild circumferential bladder wall thickening likely related to incomplete distension. Prostate is normal.  No free air or fluid. She scattered atherosclerotic plaques and within the iliac arteries bilaterally.  No acute osseous abnormality. No worrisome lytic or blastic osseous lesions. Scattered degenerative endplate Schmorl's nodes and within the lower thoracic spine.  IMPRESSION: 1. Loculated right pleural effusion with probable associated rounded atelectasis as above, incompletely evaluated on this examination. 2. No CT evidence of acute intra-abdominal or pelvic process. 3. 4 mm nonobstructive right renal calculus.   Electronically Signed   By: Jeannine Boga M.D.   On: 05/29/2013 18:48   Dg Chest 2 View  05/29/2013   CLINICAL DATA:  FLANK PAIN  EXAM: CHEST  2 VIEW  COMPARISON:  DG CHEST 2 VIEW dated 10/09/2012  FINDINGS: Low lung volumes. Cardiac silhouette is enlarged. Increased density within the lateral segment of the right middle lobe. There is blunting of the right costophrenic angle. Diffuse thickening of interstitial markings and peribronchial cuffing is appreciated. No acute osseous abnormalities.  IMPRESSION: Pulmonary edema  Atelectasis versus asymmetric edema right middle lobe  Small right pleural effusion   Electronically Signed   By: Margaree Mackintosh M.D.   On: 05/29/2013 17:36   Dg Ankle Complete Right  05/29/2013   CLINICAL DATA:  Right ankle pain and swelling, history of gout  EXAM: RIGHT ANKLE - COMPLETE 3+ VIEW  COMPARISON:  None.  FINDINGS: Three views of the right ankle submitted. No acute fracture or subluxation. Ankle mortise is preserved. There is diffuse soft tissue swelling. Posterior spurring of calcaneus at Achilles tendon insertion.  IMPRESSION: No acute fracture or  subluxation.  Diffuse soft tissue swelling.   Electronically Signed   By: Lahoma Crocker M.D.   On: 05/29/2013 17:36   Ct Chest Wo Contrast  05/29/2013   CLINICAL DATA:  Evaluate loculated pleural effusion  EXAM: CT CHEST WITHOUT CONTRAST  TECHNIQUE: Multidetector CT imaging of the chest was performed following the standard protocol without IV contrast.  COMPARISON:  Abdominal CT from the same day  FINDINGS: THORACIC INLET/BODY WALL:  No acute abnormality.  MEDIASTINUM:  Chronic cardiomegaly. There is mild aortic valvular calcification. No pericardial effusion. No evidence of acute  vascular abnormality. Negative for adenopathy.  LUNG WINDOWS:  There is a small right pleural effusion which measures water density. The fluid is anti dependent, loculated in the anterior and lateral lower chest. There is bandlike opacity in the neighboring right middle lobe and lower lobe. This opacity was not seen on 2014 radiography.  Spiculated 11 mm nodule in the superior segment left lower lobe, new from 2008. In the coronal projection, this has an elongated appearance. Small subpleural opacity in the peripheral left lower lobe favors atelectasis given the linear margins. No pulmonary edema.  UPPER ABDOMEN:  Imaged portions of the spleen enlarged at 15 cm maximal span and 8 cm thickness. There has been recent abdominal imaging.  OSSEOUS:  Multiple Schmorl's nodes and osteophytes in the mid and lower thoracic spine.  IMPRESSION: 1. Atelectasis or scarring in the right middle lobe, with small overlying loculated pleural effusion. Findings are nonspecific, and could be post infectious, posttraumatic, or related to prior lung infarct (patient has history of large volume pulmonary embolism in 2008). 2. 11 mm nodule in the superior segment left lower lobe, morphology favoring an infectious or inflammatory focus over neoplastic nodule. Followup chest CT is required in 3 months to ensure clearing.   Electronically Signed   By: Jorje Guild  M.D.   On: 05/29/2013 21:15   Dg Hand Complete Right  05/29/2013   CLINICAL DATA:  Right ankle and foot swelling, history of gout  EXAM: RIGHT HAND - COMPLETE 3+ VIEW  COMPARISON:  08/05/2007  FINDINGS: Three views of the right hand submitted. No acute fracture or subluxation. Cystic osteoarthritic changes are noted carpal bones. Erosive changes are noted distal aspect of first second and fifth metacarpal. Small erosive changes at the base of proximal phalanx second and fifth finger. Subtle erosive changes are noted distal aspect proximal phalanx second finger third finger and fifth finger. Osteoarthritic changes are noted distal interphalangeal joint second third and fourth finger.  IMPRESSION: No acute fracture or subluxation. Osteoarthritic changes as described above.   Electronically Signed   By: Lahoma Crocker M.D.   On: 05/29/2013 17:35     EKG Interpretation None      MDM   Final diagnoses:  Flank pain  Gout  Lung nodule     Patient presents with multiple complaints including left flank pain, right wrist pain, right foot pain. Patient was diagnosed with gout and is currently on prednisone. He is nontoxic-appearing and vital signs are reassuring. He has tenderness to palpation over the left flank.  Patient also noted to have swelling over the right breast and right ankle without overlying skin changes. Low suspicion at this time for septic joint - patient is afebrile with good range of motion and no overlying skin changes. Basic labwork was obtained. Noncontrast CT scan obtained to rule out kidney stones. CT scan negative for kidney stones but does show possible loculated effusion in the right lower lobe. This is likely incidental; however, CT chest obtained to further evaluate. Patient was given pain medication with improvement of his pain. CT chest shows a left pulmonary nodule as well as a atelectasis versus scarring of the right middle lobe with small effusion noted. This is not likely  related to the patient's current condition. Patient advised to CT findings and told to followup in 3 months for repeat CT. [facility]. Will give patient pain medication for his gout as he has been advised not to take Chochicine given his  renal failure. He will continue prednisone. Patient was noted to have leukocytosis but this is likely secondary to prednisone use.  After history, exam, and medical workup I feel the patient has been appropriately medically screened and is safe for discharge home. Pertinent diagnoses were discussed with the patient. Patient was given return precautions.    Merryl Hacker, MD 05/30/13 [phone]

## 2013-05-29 NOTE — ED Notes (Signed)
Unable to obtain IV access at this point after several tries. EDP notified.

## 2013-05-29 NOTE — ED Notes (Signed)
Dialysis fistula has a positive bruit and thrill.

## 2013-05-29 NOTE — ED Notes (Signed)
Pink restricted armband in place on patients left arm for dialysis fistula.

## 2013-05-29 NOTE — ED Notes (Signed)
Patient takes dialysis on M, W, F.

## 2013-05-29 NOTE — Discharge Instructions (Signed)
You were seen today for your flank pain.  I am unsure of what is causing your pain.  You should be reevaluated if your pain continues.  On CT scan you were found to have a lung nodule and need to have a repeat CT scan in 3 months.  Flank Pain Flank pain refers to pain that is located on the side of the body between the upper abdomen and the back. The pain may occur over a short period of time (acute) or may be long-term or reoccurring (chronic). It may be mild or severe. Flank pain can be caused by many things. CAUSES  Some of the more common causes of flank pain include:  Muscle strains.   Muscle spasms.   A disease of your spine (vertebral disk disease).   A lung infection (pneumonia).   Fluid around your lungs (pulmonary edema).   A kidney infection.   Kidney stones.   A very painful skin rash caused by the chickenpox virus (shingles).   Gallbladder disease.  [place] care will depend on the cause of your pain. In general,  Rest as directed by your caregiver.  Drink enough fluids to keep your urine clear or pale yellow.  Only take over-the-counter or prescription medicines as directed by your caregiver. Some medicines may help relieve the pain.  Tell your caregiver about any changes in your pain.  Follow up with your caregiver as directed. SEEK IMMEDIATE MEDICAL CARE IF:   Your pain is not controlled with medicine.   You have new or worsening symptoms.  Your pain increases.   You have abdominal pain.   You have shortness of breath.   You have persistent nausea or vomiting.   You have swelling in your abdomen.   You feel faint or pass out.   You have blood in your urine.  You have a fever or persistent symptoms for more than 2 3 days.  You have a fever and your symptoms suddenly get worse. MAKE SURE YOU:   Understand these instructions.  Will watch your condition.  Will get help right away if you are not doing well  or get worse. Document Released: 03/04/2005 Document Revised: 10/06/2011 Document Reviewed: 08/26/2011 [facility] Patient Information 2014 [place]. Gout Gout is an inflammatory arthritis caused by a buildup of uric acid crystals in the joints. Uric acid is a chemical that is normally present in the blood. When the level of uric acid in the blood is too high it can form crystals that deposit in your joints and tissues. This causes joint redness, soreness, and swelling (inflammation). Repeat attacks are common. Over time, uric acid crystals can form into masses (tophi) near a joint, destroying bone and causing disfigurement. Gout is treatable and often preventable. CAUSES  The disease begins with elevated levels of uric acid in the blood. Uric acid is produced by your body when it breaks down a naturally found substance called purines. Certain foods you eat, such as meats and fish, contain high amounts of purines. Causes of an elevated uric acid level include:  Being passed down from parent to child (heredity).  Diseases that cause increased uric acid production (such as obesity, psoriasis, and certain cancers).  Excessive alcohol use.  Diet, especially diets rich in meat and seafood.  Medicines, including certain cancer-fighting medicines (chemotherapy), water pills (diuretics), and aspirin.  Chronic kidney disease. The kidneys are no longer able to remove uric acid well.  Problems with metabolism. Conditions strongly associated  with gout include:  Obesity.  High blood pressure.  High cholesterol.  Diabetes. Not everyone with elevated uric acid levels gets gout. It is not understood why some people get gout and others do not. Surgery, joint injury, and eating too much of certain foods are some of the factors that can lead to gout attacks. SYMPTOMS   An attack of gout comes on quickly. It causes intense pain with redness, swelling, and warmth in a joint.  Fever can  occur.  Often, only one joint is involved. Certain joints are more commonly involved:  Base of the big toe.  Knee.  Ankle.  Wrist.  Finger. Without treatment, an attack usually goes away in a few days to weeks. Between attacks, you usually will not have symptoms, which is different from many other forms of arthritis. DIAGNOSIS  Your caregiver will suspect gout based on your symptoms and exam. In some cases, tests may be recommended. The tests may include:  Blood tests.  Urine tests.  X-rays.  Joint fluid exam. This exam requires a needle to remove fluid from the joint (arthrocentesis). Using a microscope, gout is confirmed when uric acid crystals are seen in the joint fluid. TREATMENT  There are two phases to gout treatment: treating the sudden onset (acute) attack and preventing attacks (prophylaxis).  Treatment of an Acute Attack.  Medicines are used. These include anti-inflammatory medicines or steroid medicines.  An injection of steroid medicine into the affected joint is sometimes necessary.  The painful joint is rested. Movement can worsen the arthritis.  You may use warm or cold treatments on painful joints, depending which works best for you.  Treatment to Prevent Attacks.  If you suffer from frequent gout attacks, your caregiver may advise preventive medicine. These medicines are started after the acute attack subsides. These medicines either help your kidneys eliminate uric acid from your body or decrease your uric acid production. You may need to stay on these medicines for a very long time.  The early phase of treatment with preventive medicine can be associated with an increase in acute gout attacks. For this reason, during the first few months of treatment, your caregiver may also advise you to take medicines usually used for acute gout treatment. Be sure you understand your caregiver's directions. Your caregiver may make several adjustments to your medicine  dose before these medicines are effective.  Discuss dietary treatment with your caregiver or dietitian. Alcohol and drinks high in sugar and fructose and foods such as meat, poultry, and seafood can increase uric acid levels. Your caregiver or dietician can advise you on drinks and foods that should be limited. HOME CARE INSTRUCTIONS   Do not take aspirin to relieve pain. This raises uric acid levels.  Only take over-the-counter or prescription medicines for pain, discomfort, or fever as directed by your caregiver.  Rest the joint as much as possible. When in bed, keep sheets and blankets off painful areas.  Keep the affected joint raised (elevated).  Apply warm or cold treatments to painful joints. Use of warm or cold treatments depends on which works best for you.  Use crutches if the painful joint is in your leg.  Drink enough fluids to keep your urine clear or pale yellow. This helps your body get rid of uric acid. Limit alcohol, sugary drinks, and fructose drinks.  Follow your dietary instructions. Pay careful attention to the amount of protein you eat. Your daily diet should emphasize fruits, vegetables, whole grains, and fat-free or  low-fat milk products. Discuss the use of coffee, vitamin C, and cherries with your caregiver or dietician. These may be helpful in lowering uric acid levels.  Maintain a healthy body weight. SEEK MEDICAL CARE IF:   You develop diarrhea, vomiting, or any side effects from medicines.  You do not feel better in 24 hours, or you are getting worse. SEEK IMMEDIATE MEDICAL CARE IF:   Your joint becomes suddenly more tender, and you have chills or a fever. MAKE SURE YOU:   Understand these instructions.  Will watch your condition.  Will get help right away if you are not doing well or get worse. Document Released: 01/09/2000 Document Revised: 05/08/2012 Document Reviewed: 08/25/2011 [facility] Patient Information 2014 [place].

## 2013-05-29 NOTE — ED Notes (Signed)
NOTIFIED DR. HORTON OF PATIENTS LAB RESULTS OF CG4+ LACTIC ACID ,@18 :07 PM ,05/29/2013.

## 2013-05-29 NOTE — ED Notes (Signed)
Pt c/o Left flank pain x2 days, pt receives dialysis M/W/F

## 2013-05-29 NOTE — ED Notes (Signed)
Pt presents to ED via [facility], with Left pain starting last night, pt seen on 4/28 for Right foot pain and diagnosed with Gout and prescribed Prednisone and instructed to take Tylenol. Pt received dialysis yesterday and was told he should not take colchicine due to dialysis. VSS BP 180/90, HR 96, RR 22 995 RA, CBG 458

## 2013-06-02 ENCOUNTER — Emergency Department (HOSPITAL_COMMUNITY): Payer: BC Managed Care – PPO

## 2013-06-02 ENCOUNTER — Encounter (HOSPITAL_COMMUNITY)
Admission: EM | Disposition: A | Discharge status: Skilled Nursing Facility | Payer: Self-pay | Source: Home / Self Care | Attending: Internal Medicine

## 2013-06-02 ENCOUNTER — Emergency Department (HOSPITAL_COMMUNITY): Payer: BC Managed Care – PPO | Admitting: Anesthesiology

## 2013-06-02 ENCOUNTER — Encounter (HOSPITAL_COMMUNITY): Payer: Self-pay | Admitting: Emergency Medicine

## 2013-06-02 ENCOUNTER — Inpatient Hospital Stay (HOSPITAL_COMMUNITY)
Admission: EM | Admit: 2013-06-02 | Discharge: 2013-07-02 | DRG: 853 | Disposition: A | Discharge status: Skilled Nursing Facility | Payer: BC Managed Care – PPO | Attending: Internal Medicine | Admitting: Internal Medicine

## 2013-06-02 ENCOUNTER — Encounter (HOSPITAL_COMMUNITY): Payer: BC Managed Care – PPO | Admitting: Anesthesiology

## 2013-06-02 DIAGNOSIS — E1169 Type 2 diabetes mellitus with other specified complication: Secondary | ICD-10-CM | POA: Diagnosis present

## 2013-06-02 DIAGNOSIS — N2581 Secondary hyperparathyroidism of renal origin: Secondary | ICD-10-CM | POA: Diagnosis present

## 2013-06-02 DIAGNOSIS — M7989 Other specified soft tissue disorders: Secondary | ICD-10-CM

## 2013-06-02 DIAGNOSIS — S8010XA Contusion of unspecified lower leg, initial encounter: Secondary | ICD-10-CM | POA: Diagnosis not present

## 2013-06-02 DIAGNOSIS — A4901 Methicillin susceptible Staphylococcus aureus infection, unspecified site: Secondary | ICD-10-CM | POA: Diagnosis present

## 2013-06-02 DIAGNOSIS — R6520 Severe sepsis without septic shock: Secondary | ICD-10-CM | POA: Diagnosis present

## 2013-06-02 DIAGNOSIS — A419 Sepsis, unspecified organism: Principal | ICD-10-CM | POA: Diagnosis present

## 2013-06-02 DIAGNOSIS — R600 Localized edema: Secondary | ICD-10-CM | POA: Diagnosis present

## 2013-06-02 DIAGNOSIS — M109 Gout, unspecified: Secondary | ICD-10-CM | POA: Diagnosis present

## 2013-06-02 DIAGNOSIS — Z86711 Personal history of pulmonary embolism: Secondary | ICD-10-CM

## 2013-06-02 DIAGNOSIS — D631 Anemia in chronic kidney disease: Secondary | ICD-10-CM | POA: Diagnosis present

## 2013-06-02 DIAGNOSIS — E875 Hyperkalemia: Secondary | ICD-10-CM | POA: Diagnosis not present

## 2013-06-02 DIAGNOSIS — G92 Toxic encephalopathy: Secondary | ICD-10-CM | POA: Diagnosis present

## 2013-06-02 DIAGNOSIS — D62 Acute posthemorrhagic anemia: Secondary | ICD-10-CM

## 2013-06-02 DIAGNOSIS — R911 Solitary pulmonary nodule: Secondary | ICD-10-CM | POA: Diagnosis present

## 2013-06-02 DIAGNOSIS — N186 End stage renal disease: Secondary | ICD-10-CM | POA: Diagnosis present

## 2013-06-02 DIAGNOSIS — E46 Unspecified protein-calorie malnutrition: Secondary | ICD-10-CM | POA: Diagnosis present

## 2013-06-02 DIAGNOSIS — Z6830 Body mass index (BMI) 30.0-30.9, adult: Secondary | ICD-10-CM

## 2013-06-02 DIAGNOSIS — E1149 Type 2 diabetes mellitus with other diabetic neurological complication: Secondary | ICD-10-CM | POA: Diagnosis present

## 2013-06-02 DIAGNOSIS — E785 Hyperlipidemia, unspecified: Secondary | ICD-10-CM | POA: Diagnosis present

## 2013-06-02 DIAGNOSIS — Z992 Dependence on renal dialysis: Secondary | ICD-10-CM

## 2013-06-02 DIAGNOSIS — L03119 Cellulitis of unspecified part of limb: Secondary | ICD-10-CM

## 2013-06-02 DIAGNOSIS — G929 Unspecified toxic encephalopathy: Secondary | ICD-10-CM | POA: Diagnosis present

## 2013-06-02 DIAGNOSIS — I120 Hypertensive chronic kidney disease with stage 5 chronic kidney disease or end stage renal disease: Secondary | ICD-10-CM | POA: Diagnosis present

## 2013-06-02 DIAGNOSIS — R4182 Altered mental status, unspecified: Secondary | ICD-10-CM

## 2013-06-02 DIAGNOSIS — S88119A Complete traumatic amputation at level between knee and ankle, unspecified lower leg, initial encounter: Secondary | ICD-10-CM

## 2013-06-02 DIAGNOSIS — J811 Chronic pulmonary edema: Secondary | ICD-10-CM

## 2013-06-02 DIAGNOSIS — Y835 Amputation of limb(s) as the cause of abnormal reaction of the patient, or of later complication, without mention of misadventure at the time of the procedure: Secondary | ICD-10-CM | POA: Diagnosis not present

## 2013-06-02 DIAGNOSIS — F4321 Adjustment disorder with depressed mood: Secondary | ICD-10-CM | POA: Diagnosis present

## 2013-06-02 DIAGNOSIS — M899 Disorder of bone, unspecified: Secondary | ICD-10-CM | POA: Diagnosis present

## 2013-06-02 DIAGNOSIS — M726 Necrotizing fasciitis: Secondary | ICD-10-CM | POA: Diagnosis present

## 2013-06-02 DIAGNOSIS — N039 Chronic nephritic syndrome with unspecified morphologic changes: Secondary | ICD-10-CM

## 2013-06-02 DIAGNOSIS — M25476 Effusion, unspecified foot: Secondary | ICD-10-CM | POA: Diagnosis present

## 2013-06-02 DIAGNOSIS — R739 Hyperglycemia, unspecified: Secondary | ICD-10-CM

## 2013-06-02 DIAGNOSIS — I82409 Acute embolism and thrombosis of unspecified deep veins of unspecified lower extremity: Secondary | ICD-10-CM

## 2013-06-02 DIAGNOSIS — I96 Gangrene, not elsewhere classified: Secondary | ICD-10-CM | POA: Diagnosis present

## 2013-06-02 DIAGNOSIS — R509 Fever, unspecified: Secondary | ICD-10-CM

## 2013-06-02 DIAGNOSIS — Z48812 Encounter for surgical aftercare following surgery on the circulatory system: Secondary | ICD-10-CM

## 2013-06-02 DIAGNOSIS — R609 Edema, unspecified: Secondary | ICD-10-CM | POA: Diagnosis not present

## 2013-06-02 DIAGNOSIS — L259 Unspecified contact dermatitis, unspecified cause: Secondary | ICD-10-CM | POA: Diagnosis present

## 2013-06-02 DIAGNOSIS — N189 Chronic kidney disease, unspecified: Secondary | ICD-10-CM

## 2013-06-02 DIAGNOSIS — D500 Iron deficiency anemia secondary to blood loss (chronic): Secondary | ICD-10-CM | POA: Diagnosis present

## 2013-06-02 DIAGNOSIS — L02619 Cutaneous abscess of unspecified foot: Secondary | ICD-10-CM | POA: Diagnosis present

## 2013-06-02 DIAGNOSIS — G062 Extradural and subdural abscess, unspecified: Secondary | ICD-10-CM

## 2013-06-02 DIAGNOSIS — I2699 Other pulmonary embolism without acute cor pulmonale: Secondary | ICD-10-CM | POA: Diagnosis present

## 2013-06-02 DIAGNOSIS — R404 Transient alteration of awareness: Secondary | ICD-10-CM | POA: Diagnosis not present

## 2013-06-02 DIAGNOSIS — Z7901 Long term (current) use of anticoagulants: Secondary | ICD-10-CM

## 2013-06-02 DIAGNOSIS — M949 Disorder of cartilage, unspecified: Secondary | ICD-10-CM

## 2013-06-02 DIAGNOSIS — L02419 Cutaneous abscess of limb, unspecified: Secondary | ICD-10-CM | POA: Diagnosis present

## 2013-06-02 DIAGNOSIS — E1129 Type 2 diabetes mellitus with other diabetic kidney complication: Secondary | ICD-10-CM | POA: Diagnosis present

## 2013-06-02 DIAGNOSIS — IMO0002 Reserved for concepts with insufficient information to code with codable children: Secondary | ICD-10-CM

## 2013-06-02 DIAGNOSIS — R6521 Severe sepsis with septic shock: Secondary | ICD-10-CM

## 2013-06-02 DIAGNOSIS — D72829 Elevated white blood cell count, unspecified: Secondary | ICD-10-CM

## 2013-06-02 DIAGNOSIS — I82629 Acute embolism and thrombosis of deep veins of unspecified upper extremity: Secondary | ICD-10-CM | POA: Diagnosis present

## 2013-06-02 DIAGNOSIS — L02415 Cutaneous abscess of right lower limb: Secondary | ICD-10-CM

## 2013-06-02 DIAGNOSIS — I10 Essential (primary) hypertension: Secondary | ICD-10-CM | POA: Diagnosis present

## 2013-06-02 DIAGNOSIS — N185 Chronic kidney disease, stage 5: Secondary | ICD-10-CM

## 2013-06-02 DIAGNOSIS — M009 Pyogenic arthritis, unspecified: Secondary | ICD-10-CM | POA: Diagnosis present

## 2013-06-02 DIAGNOSIS — E118 Type 2 diabetes mellitus with unspecified complications: Secondary | ICD-10-CM

## 2013-06-02 DIAGNOSIS — J9600 Acute respiratory failure, unspecified whether with hypoxia or hypercapnia: Secondary | ICD-10-CM

## 2013-06-02 DIAGNOSIS — E1165 Type 2 diabetes mellitus with hyperglycemia: Secondary | ICD-10-CM

## 2013-06-02 DIAGNOSIS — M25473 Effusion, unspecified ankle: Secondary | ICD-10-CM | POA: Diagnosis present

## 2013-06-02 HISTORY — DX: Chronic kidney disease, unspecified: N18.9

## 2013-06-02 HISTORY — PX: I & D EXTREMITY: SHX5045

## 2013-06-02 LAB — COMPREHENSIVE METABOLIC PANEL
ALBUMIN: 1.9 g/dL — AB (ref 3.5–5.2)
ALK PHOS: 166 U/L — AB (ref 39–117)
ALT: 17 U/L (ref 0–53)
AST: 15 U/L (ref 0–37)
BUN: 65 mg/dL — AB (ref 6–23)
CHLORIDE: 92 meq/L — AB (ref 96–112)
CO2: 23 mEq/L (ref 19–32)
Calcium: 9.3 mg/dL (ref 8.4–10.5)
Creatinine, Ser: 5.7 mg/dL — ABNORMAL HIGH (ref 0.50–1.35)
GFR calc Af Amer: 13 mL/min — ABNORMAL LOW (ref >90)
GFR calc non Af Amer: 11 mL/min — ABNORMAL LOW (ref >90)
Glucose, Bld: 260 mg/dL — ABNORMAL HIGH (ref 70–99)
POTASSIUM: 4.5 meq/L (ref 3.7–5.3)
Sodium: 136 mEq/L — ABNORMAL LOW (ref 137–147)
TOTAL PROTEIN: 8.3 g/dL (ref 6.0–8.3)
Total Bilirubin: 0.5 mg/dL (ref 0.3–1.2)

## 2013-06-02 LAB — CBC
HEMATOCRIT: 30.7 % — AB (ref 39.0–52.0)
HEMOGLOBIN: 9.7 g/dL — AB (ref 13.0–17.0)
MCH: 26.1 pg (ref 26.0–34.0)
MCHC: 31.6 g/dL (ref 30.0–36.0)
MCV: 82.5 fL (ref 78.0–100.0)
Platelets: 368 10*3/uL (ref 150–400)
RBC: 3.72 MIL/uL — ABNORMAL LOW (ref 4.22–5.81)
RDW: 16.3 % — AB (ref 11.5–15.5)
WBC: 15.6 10*3/uL — AB (ref 4.0–10.5)

## 2013-06-02 LAB — SEDIMENTATION RATE: Sed Rate: 135 mm/hr — ABNORMAL HIGH (ref 0–16)

## 2013-06-02 SURGERY — IRRIGATION AND DEBRIDEMENT EXTREMITY
Anesthesia: General | Site: Ankle | Laterality: Right

## 2013-06-02 MED ORDER — SODIUM CHLORIDE 0.9 % IJ SOLN
INTRAMUSCULAR | Status: AC
  Filled 2013-06-02: qty 10

## 2013-06-02 MED ORDER — PROPOFOL 10 MG/ML IV BOLUS
INTRAVENOUS | Status: AC
  Filled 2013-06-02: qty 20

## 2013-06-02 MED ORDER — SUCCINYLCHOLINE CHLORIDE 20 MG/ML IJ SOLN
INTRAMUSCULAR | Status: AC
  Filled 2013-06-02: qty 1

## 2013-06-02 MED ORDER — SODIUM CHLORIDE 0.9 % IV SOLN: INTRAVENOUS | Status: DC

## 2013-06-02 MED ORDER — LIDOCAINE HCL (CARDIAC) 20 MG/ML IV SOLN
INTRAVENOUS | Status: AC
  Filled 2013-06-02: qty 5

## 2013-06-02 MED ORDER — HYDROMORPHONE HCL PF 1 MG/ML IJ SOLN
1.0000 mg | Freq: Once | INTRAMUSCULAR | Status: AC
  Administered 2013-06-02: 1 mg via INTRAVENOUS
  Filled 2013-06-02: qty 1

## 2013-06-02 MED ORDER — SODIUM CHLORIDE 0.9 % IV SOLN
INTRAVENOUS | Status: DC | PRN
  Administered 2013-06-02: via INTRAVENOUS

## 2013-06-02 MED ORDER — SUFENTANIL CITRATE 50 MCG/ML IV SOLN
INTRAVENOUS | Status: AC
  Filled 2013-06-02: qty 1

## 2013-06-02 MED ORDER — PIPERACILLIN-TAZOBACTAM 3.375 G IVPB 30 MIN
3.3750 g | Freq: Once | INTRAVENOUS | Status: AC
  Administered 2013-06-02: 3.375 g via INTRAVENOUS
  Filled 2013-06-02: qty 50

## 2013-06-02 MED ORDER — MIDAZOLAM HCL 2 MG/2ML IJ SOLN
INTRAMUSCULAR | Status: AC
  Filled 2013-06-02: qty 2

## 2013-06-02 MED ORDER — VANCOMYCIN HCL 10 G IV SOLR
2000.0000 mg | Freq: Once | INTRAVENOUS | Status: DC
  Filled 2013-06-02: qty 2000

## 2013-06-02 MED ORDER — SODIUM CHLORIDE 0.9 % IR SOLN
Status: DC | PRN
  Administered 2013-06-02: 1

## 2013-06-02 SURGICAL SUPPLY — 54 items
BANDAGE ELASTIC 4 VELCRO ST LF (GAUZE/BANDAGES/DRESSINGS) ×3 IMPLANT
BANDAGE ELASTIC 6 VELCRO ST LF (GAUZE/BANDAGES/DRESSINGS) ×3 IMPLANT
BANDAGE GAUZE ELAST BULKY 4 IN (GAUZE/BANDAGES/DRESSINGS) ×3 IMPLANT
BNDG COHESIVE 4X5 TAN STRL (GAUZE/BANDAGES/DRESSINGS) ×3 IMPLANT
BRUSH SCRUB DISP (MISCELLANEOUS) ×3 IMPLANT
COVER SURGICAL LIGHT HANDLE (MISCELLANEOUS) ×3 IMPLANT
CUFF TOURNIQUET SINGLE 18IN (TOURNIQUET CUFF) ×3 IMPLANT
CUFF TOURNIQUET SINGLE 24IN (TOURNIQUET CUFF) IMPLANT
CUFF TOURNIQUET SINGLE 34IN LL (TOURNIQUET CUFF) IMPLANT
DRAPE IMP U-DRAPE 54X76 (DRAPES) ×3 IMPLANT
DRAPE U-SHAPE 47X51 STRL (DRAPES) ×3 IMPLANT
DRSG ADAPTIC 3X8 NADH LF (GAUZE/BANDAGES/DRESSINGS) ×3 IMPLANT
DRSG PAD ABDOMINAL 8X10 ST (GAUZE/BANDAGES/DRESSINGS) ×6 IMPLANT
DURAPREP 26ML APPLICATOR (WOUND CARE) ×3 IMPLANT
ELECT REM PT RETURN 9FT ADLT (ELECTROSURGICAL)
ELECTRODE REM PT RTRN 9FT ADLT (ELECTROSURGICAL) IMPLANT
EVACUATOR 1/8 PVC DRAIN (DRAIN) ×3 IMPLANT
GLOVE BIOGEL PI IND STRL 7.5 (GLOVE) ×1 IMPLANT
GLOVE BIOGEL PI INDICATOR 7.5 (GLOVE) ×2
GLOVE BIOGEL PI ORTHO PRO SZ8 (GLOVE) ×2
GLOVE ORTHO TXT STRL SZ7.5 (GLOVE) ×3 IMPLANT
GLOVE PI ORTHO PRO STRL SZ8 (GLOVE) ×1 IMPLANT
GLOVE SURG ORTHO 8.5 STRL (GLOVE) ×3 IMPLANT
GLOVE SURG ORTHO 9.0 STRL STRW (GLOVE) ×3 IMPLANT
GLOVE SURG SS PI 7.5 STRL IVOR (GLOVE) ×3 IMPLANT
GOWN STRL REUS W/ TWL LRG LVL3 (GOWN DISPOSABLE) ×1 IMPLANT
GOWN STRL REUS W/ TWL XL LVL3 (GOWN DISPOSABLE) ×1 IMPLANT
GOWN STRL REUS W/TWL LRG LVL3 (GOWN DISPOSABLE) ×2
GOWN STRL REUS W/TWL XL LVL3 (GOWN DISPOSABLE) ×2
HANDPIECE INTERPULSE COAX TIP (DISPOSABLE) ×2
KIT BASIN OR (CUSTOM PROCEDURE TRAY) ×3 IMPLANT
KIT ROOM TURNOVER OR (KITS) ×3 IMPLANT
MANIFOLD NEPTUNE II (INSTRUMENTS) ×3 IMPLANT
NEEDLE 18GX1X1/2 (RX/OR ONLY) (NEEDLE) ×3 IMPLANT
PACK ORTHO EXTREMITY (CUSTOM PROCEDURE TRAY) ×3 IMPLANT
PAD ABD 8X10 STRL (GAUZE/BANDAGES/DRESSINGS) ×6 IMPLANT
PAD ARMBOARD 7.5X6 YLW CONV (MISCELLANEOUS) ×6 IMPLANT
SET ARTHROSCOPY TUBING (MISCELLANEOUS) ×2
SET ARTHROSCOPY TUBING LN (MISCELLANEOUS) ×1 IMPLANT
SET HNDPC FAN SPRY TIP SCT (DISPOSABLE) ×1 IMPLANT
SPONGE GAUZE 4X4 12PLY (GAUZE/BANDAGES/DRESSINGS) ×3 IMPLANT
SPONGE LAP 18X18 X RAY DECT (DISPOSABLE) ×3 IMPLANT
SPONGE LAP 4X18 X RAY DECT (DISPOSABLE) ×3 IMPLANT
SUT ETHILON 2 0 FS 18 (SUTURE) ×9 IMPLANT
SUT VIC AB 2-0 CT1 27 (SUTURE)
SUT VIC AB 2-0 CT1 TAPERPNT 27 (SUTURE) IMPLANT
SYR 20CC LL (SYRINGE) ×3 IMPLANT
TOWEL OR 17X24 6PK STRL BLUE (TOWEL DISPOSABLE) ×3 IMPLANT
TOWEL OR 17X26 10 PK STRL BLUE (TOWEL DISPOSABLE) ×3 IMPLANT
TUBE ANAEROBIC SPECIMEN COL (MISCELLANEOUS) IMPLANT
TUBE CONNECTING 12'X1/4 (SUCTIONS) ×2
TUBE CONNECTING 12X1/4 (SUCTIONS) ×4 IMPLANT
UNDERPAD 30X30 INCONTINENT (UNDERPADS AND DIAPERS) ×3 IMPLANT
YANKAUER SUCT BULB TIP NO VENT (SUCTIONS) ×3 IMPLANT

## 2013-06-02 NOTE — ED Notes (Signed)
Patient transported to OR holding.

## 2013-06-02 NOTE — ED Notes (Signed)
Patient transported back from MRI. 

## 2013-06-02 NOTE — ED Provider Notes (Signed)
CSN: [id number]     Arrival date & time 06/02/13  1256 History   First MD Initiated Contact with Patient 06/02/13 1527     Chief Complaint  Patient presents with  . Generalized Body Aches  . Edema     (Consider location/radiation/quality/duration/timing/severity/associated sxs/prior Treatment) [name]. male presenting with ankle pain. The history is provided by the patient.  Ankle Pain Location:  Ankle Ankle location:  R ankle Pain details:    Quality:  Aching   Radiates to:  R leg   Severity:  Severe   Onset quality:  Gradual   Duration:  2 weeks   Timing:  Constant   Progression:  Worsening Chronicity:  New Dislocation: no   Foreign body present:  No foreign bodies Prior injury to area:  Yes [place] on it 2 days ago) Relieved by:  Nothing Worsened by:  Nothing tried Ineffective treatments:  NSAIDs (Steroids) Associated symptoms: fatigue, stiffness and swelling (Diffusely overall 4 extremities but especially in the right ankle)   Associated symptoms: no fever   Risk factors comment:  Diabetes and dialysis patient   Past Medical History  Diagnosis Date  . Eczema   . Morbid obesity   . Gout   . HTN (hypertension)   . Dyslipidemia   . Pulmonary embolism     3  in  lungs  at  one time...  . Abscess     great toe  . Pancreatitis   . Arthritis   . Depression   . Diabetes mellitus   . Anemia   . Pneumonia     2-3 years ago  . Shortness of breath     ?? chest  cold he has now.  10/8- no SOB  . Renal hypertension     Hemo started  Sept 2014- MWF   Past Surgical History  Procedure Laterality Date  . None    . Av fistula placement Left 04/07/2012    Procedure: ARTERIOVENOUS (AV) FISTULA CREATION;  Surgeon: Angelia Mould, MD;  Location: [facility];  Service: Vascular;  Laterality: Left;  . Ligation of competing branches of arteriovenous fistula Left 09/19/2012    Procedure: LIGATION OF COMPETING BRANCHES OF LEFT ARM ARTERIOVENOUS FISTULA; Vein  angioplasty;  Surgeon: Angelia Mould, MD;  Location: [facility];  Service: Vascular;  Laterality: Left;  . Insertion of dialysis catheter Right 09/19/2012    Procedure: INSERTION OF DIALYSIS CATHETER;  Surgeon: Angelia Mould, MD;  Location: [facility] OR;  Service: Vascular;  Laterality: Right;   Family History  Problem Relation Age of Onset  . Stroke Mother   . Diabetes Mother   . Hypertension Mother   . Cancer    . Coronary artery disease    . Hypertension Father   . Colon cancer Maternal Grandmother 69  . Hypertension Brother    History  Substance Use Topics  . Smoking status: Never Smoker   . Smokeless tobacco: Never Used  . Alcohol Use: No     Comment:  24oz beer  per week.  10/8 no drinking    Review of Systems  Constitutional: Positive for activity change, appetite change and fatigue. Negative for fever and unexpected weight change.  Cardiovascular: Positive for leg swelling.  Musculoskeletal: Positive for arthralgias, joint swelling, myalgias and stiffness.  Skin: Positive for rash (Redness and peeling of the lateral malleolus of the right ankle) and wound (That recently opened over the back portion of the ankle).  All other systems reviewed  and are negative.     Allergies  Review of patient's allergies indicates no known allergies.  Home Medications   Prior to Admission medications   Medication Sig Start Date End Date Taking? Authorizing Provider  labetalol (NORMODYNE) 200 MG tablet Take 200 mg by mouth 2 (two) times daily.    Historical Provider, MD  oxyCODONE-acetaminophen (PERCOCET/ROXICET) 5-325 MG per tablet Take 1-2 tablets by mouth every 4 (four) hours as needed for severe pain. 05/23/13   Shari A Upstill, PA-C  oxyCODONE-acetaminophen (PERCOCET/ROXICET) 5-325 MG per tablet Take 1-2 tablets by mouth every 6 (six) hours as needed for severe pain. 05/29/13   Merryl Hacker, MD  sevelamer carbonate (RENVELA) 800 MG tablet Take 800 mg by mouth 3 (three) times  daily with meals.    Historical Provider, MD   BP 133/66  Pulse 107  Temp(Src) 98.1 F (36.7 C) (Oral)  Resp 22  Ht 6' 1"  (1.854 m)  Wt 250 lb (113.399 kg)  BMI 32.99 kg/m2  SpO2 98% Physical Exam  Constitutional: He is oriented to person, place, and time. He appears well-developed and well-nourished. No distress.  HENT:  Head: Normocephalic and atraumatic.  Eyes: Conjunctivae are normal.  Neck: Neck supple. No tracheal deviation present.  Cardiovascular: Normal rate and regular rhythm.   Pulmonary/Chest: Effort normal. No respiratory distress. He has no decreased breath sounds.  Abdominal: Soft. He exhibits no distension.  Musculoskeletal:       Right ankle: He exhibits decreased range of motion and swelling (with overlying erythema, warmth, desquamation over lateral malleolus, and exposed tendinous appearing structure posterior to lateral  malleolus  ). Tenderness. Lateral malleolus and medial malleolus tenderness found. Achilles tendon exhibits pain.  Edema of all 4 extremities worst in right ankle and hand  Neurological: He is alert and oriented to person, place, and time. He has normal strength. No sensory deficit.  Skin: Skin is warm and dry.  Psychiatric: He has a normal mood and affect.    ED Course  Procedures (including critical care time) Labs Review Labs Reviewed  CBC - Abnormal; Notable for the following:    WBC 15.6 (*)    RBC 3.72 (*)    Hemoglobin 9.7 (*)    HCT 30.7 (*)    RDW 16.3 (*)    All other components within normal limits  COMPREHENSIVE METABOLIC PANEL - Abnormal; Notable for the following:    Sodium 136 (*)    Chloride 92 (*)    Glucose, Bld 260 (*)    BUN 65 (*)    Creatinine, Ser 5.70 (*)    Albumin 1.9 (*)    Alkaline Phosphatase 166 (*)    GFR calc non Af Amer 11 (*)    GFR calc Af Amer 13 (*)    All other components within normal limits  SEDIMENTATION RATE - Abnormal; Notable for the following:    Sed Rate 135 (*)    All other  components within normal limits  CULTURE, BLOOD (ROUTINE X 2)  CULTURE, BLOOD (ROUTINE X 2)  C-REACTIVE PROTEIN    Imaging Review Dg Chest 2 View  06/02/2013   CLINICAL DATA:  Chest pain.  EXAM: CHEST  2 VIEW  COMPARISON:  05/29/2013 and 05/04/2012 as well as chest CT 05/29/2013  FINDINGS: The lungs are hypoinflated with mild interval improvement in right base opacification likely improving effusions/atelectasis. Cannot exclude infection in the right base. Cardiomediastinal silhouette and remainder of the exam is unchanged.  IMPRESSION: Improving right base process  likely effusion and atelectasis. Cannot completely exclude infection in the right base.   Electronically Signed   By: Marin Olp M.D.   On: 06/02/2013 15:19   Dg Ankle Complete Right  06/02/2013   CLINICAL DATA:  Edema, swelling, open wound lateral right ankle  EXAM: RIGHT ANKLE - COMPLETE 3+ VIEW  COMPARISON:  None.  FINDINGS: There is no acute fracture no subluxation. The bones are osteopenic likely secondary to hypovolemia. Soft tissue swelling is again appreciated. There is no evidence of cortical destruction.  IMPRESSION: No acute osseous abnormalities.  Diffuse soft tissue swelling.   Electronically Signed   By: Margaree Mackintosh M.D.   On: 06/02/2013 16:54   Mr Ankle Right  Wo Contrast  06/02/2013   CLINICAL DATA:  Right lateral ankle wound.  EXAM: MRI OF THE RIGHT ANKLE WITHOUT CONTRAST  TECHNIQUE: Multiplanar, multisequence MR imaging of the ankle was performed. No intravenous contrast was administered.  COMPARISON:  None.  FINDINGS: There is a large fluid collection in the dorsal ankle, which is suspicious for abscess. There appear to be due tiny loculated gas bubbles within this collection. This is heterogeneous and dissects around the posterior aspect of the ankle.  Craniocaudal extent of this collection as 11.4 cm. Transverse measurement is 7.9 cm. AP thickness is difficult to measure because of the crescentic shaped conforming to  the posterior aspect of the ankle but measures at least 3 cm in maximal thickness. This likely abscess infiltrates between the Achilles tendon, the posterior medial tendons and lies dorsal to the peroneal tendons. Diffuse subcutaneous edema is present in the distal leg and ankle extending to the forefoot.  Bone marrow signal is markedly abnormal, with heterogeneous signal in the distal tibia and fibula, probably due to renal osteodystrophy. Osteochondral lesion of the medial talar dome is present with subchondral marrow edema. No fracture is present. Large ankle effusion. Minimal fluid is present in the posterior medial and posterior lateral tendon sheaths.  IMPRESSION: Large heterogeneous fluid collection in the posterior ankle, anterior to the Achilles tendon consistent with abscess. There appear to be tiny loculated gas centrally, suggesting infection with gas-forming organism.  No convincing evidence of osteomyelitis. Large ankle effusion. Septic arthritis cannot be excluded.   Electronically Signed   By: Dereck Ligas M.D.   On: 06/02/2013 22:09     EKG Interpretation None      MDM   Final diagnoses:  Abscess of right lower leg   43 y.o. male presents with pain of the right ankle and swelling in all 4 extremities. He felt so poorly today he missed his dialysis treatment. On arrival he is in chronic renal failure, he states he does produce urine still at night and has been unchanged. He is not complaining of any fevers, but he is diabetic and on dialysis which would put him at increased risk for infection. He had been here prior and clinically was diagnosed with gout but prior examinations showed that there was no fluctuance or drainage at that time. He does have a leukocytosis but has recently been on steroids for presumed gout flare, so will check ESR and CRP.  At this time the patient presents with right ankle that is very concerning for an infectious process as it has a soft tissue defect  posteriorly above the calcaneus and appears to have exposed soft tissue which is vastly worsened compared to prior examinations documented here. There is erythema and desquamation of the lateral malleolus. He may require joint aspiration  but MRI was ordered of the affected extremity in order to assess for osteomyelitis versus septic joint or other soft tissue infection in this high-risk gentleman.  Inflammatory markers elevated, there is a large fluid collection in the posterior ankle anterior to the Achilles tendon consistent with abscess and there is concern for a gas-forming organism there per the radiology report, the bones do not appear to be acutely infected but there is an ankle joint effusion and we cannot exclude a septic arthritis. It is possible that the patient developed a skin defect over the initial insult to his ankle and has a developing infection tracking into the soft tissues or had a subclinical infection that has clinically declared itself despite lack of systemic symptoms during course of illness.  Orthopedic surgery was consulted for evaluation of the patient's ankle and he will be admitted to internal medicine on IV antibiotics after blood cultures are drawn for further monitoring of serious infection of the soft tissues in the right lower extremity. He will require dialysis during his hospitalization as he missed it today.   Leo Grosser, MD 06/03/13 [phone]

## 2013-06-02 NOTE — ED Notes (Signed)
MD at bedside. 

## 2013-06-02 NOTE — ED Notes (Signed)
Pt from a dialysis center with c/o generalized body aches and swelling to entire body. Pt reports that he was not able to start his dialysis treatment today.

## 2013-06-02 NOTE — ED Notes (Signed)
Patient transported to MRI 

## 2013-06-02 NOTE — ED Provider Notes (Signed)
43 year old male dialysis patient who presents with a complaint of right ankle swelling and pain. He states that he fell on it at some time in the last week which she states caused it to open up and it has been draining since that time. He has a very swollen tender draining right ankle with a open wound which appears to contain soft tissue and possible tendon like structures. There is a foul-smelling serosanguineous and purulent mixed discharge from the wound. He has edema of the right lower extremity.  The patient otherwise does not appear to be in acute distress.  Labs show leukocytosis of 15,000 however the patient is on prednisone. He does have acute renal failure but no hyperkalemia. Chest x-ray shows no pulmonary edema. The patient will need MRI of his ankle, this is likely infected, anticipate admission to the hospital.  I saw and evaluated the patient, reviewed the resident's note and I agree with the findings and plan.    Johnna Acosta, MD 06/03/13 [phone]

## 2013-06-02 NOTE — Consult Note (Signed)
Reason for Consult:right leg infection Referring Physician: Sabra Heck MD  Steven Logan is an 43 y.o. male.  HPI: 44 yo diabetic with a several week history of swelling in his right ankle. Patient thought he had gout.  He struck the ankle on an object and it broke open and began draining.  Patient presented to the ED for eval.  Past Medical History  Diagnosis Date  . Eczema   . Morbid obesity   . Gout   . HTN (hypertension)   . Dyslipidemia   . Pulmonary embolism     3  in  lungs  at  one time...  . Abscess     great toe  . Pancreatitis   . Arthritis   . Depression   . Diabetes mellitus   . Anemia   . Pneumonia     2-3 years ago  . Shortness of breath     ?? chest  cold he has now.  10/8- no SOB  . Renal hypertension     Hemo started  Sept 2014- MWF    Past Surgical History  Procedure Laterality Date  . None    . Av fistula placement Left 04/07/2012    Procedure: ARTERIOVENOUS (AV) FISTULA CREATION;  Surgeon: Angelia Mould, MD;  Location: [place];  Service: Vascular;  Laterality: Left;  . Ligation of competing branches of arteriovenous fistula Left 09/19/2012    Procedure: LIGATION OF COMPETING BRANCHES OF LEFT ARM ARTERIOVENOUS FISTULA; Vein angioplasty;  Surgeon: Angelia Mould, MD;  Location: [place];  Service: Vascular;  Laterality: Left;  . Insertion of dialysis catheter Right 09/19/2012    Procedure: INSERTION OF DIALYSIS CATHETER;  Surgeon: Angelia Mould, MD;  Location: [facility] OR;  Service: Vascular;  Laterality: Right;    Family History  Problem Relation Age of Onset  . Stroke Mother   . Diabetes Mother   . Hypertension Mother   . Cancer    . Coronary artery disease    . Hypertension Father   . Colon cancer Maternal Grandmother 5  . Hypertension Brother     Social History:  reports that he has never smoked. He has never used smokeless tobacco. He reports that he does not drink alcohol or use illicit drugs.  Allergies: No Known  Allergies  Medications: I have reviewed the patient's current medications.  Results for orders placed during the hospital encounter of 06/02/13 (from the past 48 hour(s))  CBC     Status: Abnormal   Collection Time    06/02/13  2:23 PM      Result Value Ref Range   WBC 15.6 (*) 4.0 - 10.5 K/uL   RBC 3.72 (*) 4.22 - 5.81 MIL/uL   Hemoglobin 9.7 (*) 13.0 - 17.0 g/dL   HCT 30.7 (*) 39.0 - 52.0 %   MCV 82.5  78.0 - 100.0 fL   MCH 26.1  26.0 - 34.0 pg   MCHC 31.6  30.0 - 36.0 g/dL   RDW 16.3 (*) 11.5 - 15.5 %   Platelets 368  150 - 400 K/uL  COMPREHENSIVE METABOLIC PANEL     Status: Abnormal   Collection Time    06/02/13  2:23 PM      Result Value Ref Range   Sodium 136 (*) 137 - 147 mEq/L   Potassium 4.5  3.7 - 5.3 mEq/L   Chloride 92 (*) 96 - 112 mEq/L   CO2 23  19 - 32 mEq/L   Glucose, Bld 260 (*)  70 - 99 mg/dL   BUN 65 (*) 6 - 23 mg/dL   Creatinine, Ser 5.70 (*) 0.50 - 1.35 mg/dL   Calcium 9.3  8.4 - 10.5 mg/dL   Total Protein 8.3  6.0 - 8.3 g/dL   Albumin 1.9 (*) 3.5 - 5.2 g/dL   AST 15  0 - 37 U/L   ALT 17  0 - 53 U/L   Alkaline Phosphatase 166 (*) 39 - 117 U/L   Total Bilirubin 0.5  0.3 - 1.2 mg/dL   GFR calc non Af Amer 11 (*) >90 mL/min   GFR calc Af Amer 13 (*) >90 mL/min   Comment: (NOTE)     The eGFR has been calculated using the CKD EPI equation.     This calculation has not been validated in all clinical situations.     eGFR's persistently <90 mL/min signify possible Chronic Kidney     Disease.  SEDIMENTATION RATE     Status: Abnormal   Collection Time    06/02/13  9:10 PM      Result Value Ref Range   Sed Rate 135 (*) 0 - 16 mm/hr    Dg Chest 2 View  06/02/2013   CLINICAL DATA:  Chest pain.  EXAM: CHEST  2 VIEW  COMPARISON:  05/29/2013 and 05/04/2012 as well as chest CT 05/29/2013  FINDINGS: The lungs are hypoinflated with mild interval improvement in right base opacification likely improving effusions/atelectasis. Cannot exclude infection in the right  base. Cardiomediastinal silhouette and remainder of the exam is unchanged.  IMPRESSION: Improving right base process likely effusion and atelectasis. Cannot completely exclude infection in the right base.   Electronically Signed   By: Marin Olp M.D.   On: 06/02/2013 15:19   Dg Ankle Complete Right  06/02/2013   CLINICAL DATA:  Edema, swelling, open wound lateral right ankle  EXAM: RIGHT ANKLE - COMPLETE 3+ VIEW  COMPARISON:  None.  FINDINGS: There is no acute fracture no subluxation. The bones are osteopenic likely secondary to hypovolemia. Soft tissue swelling is again appreciated. There is no evidence of cortical destruction.  IMPRESSION: No acute osseous abnormalities.  Diffuse soft tissue swelling.   Electronically Signed   By: Margaree Mackintosh M.D.   On: 06/02/2013 16:54   Mr Ankle Right  Wo Contrast  06/02/2013   CLINICAL DATA:  Right lateral ankle wound.  EXAM: MRI OF THE RIGHT ANKLE WITHOUT CONTRAST  TECHNIQUE: Multiplanar, multisequence MR imaging of the ankle was performed. No intravenous contrast was administered.  COMPARISON:  None.  FINDINGS: There is a large fluid collection in the dorsal ankle, which is suspicious for abscess. There appear to be due tiny loculated gas bubbles within this collection. This is heterogeneous and dissects around the posterior aspect of the ankle.  Craniocaudal extent of this collection as 11.4 cm. Transverse measurement is 7.9 cm. AP thickness is difficult to measure because of the crescentic shaped conforming to the posterior aspect of the ankle but measures at least 3 cm in maximal thickness. This likely abscess infiltrates between the Achilles tendon, the posterior medial tendons and lies dorsal to the peroneal tendons. Diffuse subcutaneous edema is present in the distal leg and ankle extending to the forefoot.  Bone marrow signal is markedly abnormal, with heterogeneous signal in the distal tibia and fibula, probably due to renal osteodystrophy. Osteochondral  lesion of the medial talar dome is present with subchondral marrow edema. No fracture is present. Large ankle effusion. Minimal fluid is present  in the posterior medial and posterior lateral tendon sheaths.  IMPRESSION: Large heterogeneous fluid collection in the posterior ankle, anterior to the Achilles tendon consistent with abscess. There appear to be tiny loculated gas centrally, suggesting infection with gas-forming organism.  No convincing evidence of osteomyelitis. Large ankle effusion. Septic arthritis cannot be excluded.   Electronically Signed   By: Dereck Ligas M.D.   On: 06/02/2013 22:09    ROS Blood pressure 126/55, pulse 107, temperature 98.1 F (36.7 C), temperature source Oral, resp. rate 20, height 6' 1"  (1.854 m), weight 113.399 kg (250 lb), SpO2 91.00%. Physical Exam  Patient is in obvious distress with swollen and warm right ankle with 1 cm open draining wound to the achilles area. Patient able to wiggle the toes and has intact protective sensation distally.  Desquamation of the skin over the general area.  Assessment/Plan: Right ankle /leg abscess with possible gas gangrene in diabetic. Emergent surgical debridement planned.  Broad spectrum antibiotics begun. Medicine to admit  Augustin Schooling 06/02/2013, 10:46 PM

## 2013-06-02 NOTE — Anesthesia Preprocedure Evaluation (Signed)
Anesthesia Evaluation  Patient identified by MRN, date of birth, ID band Patient awake    Reviewed: Allergy & Precautions, H&P , NPO status , Patient's Chart, lab work & pertinent test results  Airway Mallampati: II  Neck ROM: full    Dental   Pulmonary shortness of breath,          Cardiovascular hypertension,     Neuro/Psych Depression    GI/Hepatic   Endo/Other  diabetes, Type 2  Renal/GU ESRF and DialysisRenal disease     Musculoskeletal  (+) Arthritis -,   Abdominal   Peds  Hematology   Anesthesia Other Findings   Reproductive/Obstetrics                           Anesthesia Physical Anesthesia Plan  ASA: III  Anesthesia Plan: General   Post-op Pain Management:    Induction: Intravenous  Airway Management Planned: LMA  Additional Equipment:   Intra-op Plan:   Post-operative Plan:   Informed Consent: I have reviewed the patients History and Physical, chart, labs and discussed the procedure including the risks, benefits and alternatives for the proposed anesthesia with the patient or authorized representative who has indicated his/her understanding and acceptance.     Plan Discussed with: CRNA, Anesthesiologist and Surgeon  Anesthesia Plan Comments:         Anesthesia Quick Evaluation

## 2013-06-03 ENCOUNTER — Encounter (HOSPITAL_COMMUNITY): Payer: Self-pay | Admitting: Internal Medicine

## 2013-06-03 DIAGNOSIS — R4182 Altered mental status, unspecified: Secondary | ICD-10-CM

## 2013-06-03 DIAGNOSIS — R6521 Severe sepsis with septic shock: Secondary | ICD-10-CM

## 2013-06-03 DIAGNOSIS — R6520 Severe sepsis without septic shock: Secondary | ICD-10-CM

## 2013-06-03 DIAGNOSIS — R600 Localized edema: Secondary | ICD-10-CM | POA: Diagnosis present

## 2013-06-03 DIAGNOSIS — A419 Sepsis, unspecified organism: Secondary | ICD-10-CM | POA: Diagnosis present

## 2013-06-03 DIAGNOSIS — N189 Chronic kidney disease, unspecified: Secondary | ICD-10-CM

## 2013-06-03 DIAGNOSIS — G929 Unspecified toxic encephalopathy: Secondary | ICD-10-CM | POA: Diagnosis present

## 2013-06-03 DIAGNOSIS — N186 End stage renal disease: Secondary | ICD-10-CM

## 2013-06-03 DIAGNOSIS — E118 Type 2 diabetes mellitus with unspecified complications: Secondary | ICD-10-CM

## 2013-06-03 DIAGNOSIS — L02419 Cutaneous abscess of limb, unspecified: Secondary | ICD-10-CM

## 2013-06-03 DIAGNOSIS — G92 Toxic encephalopathy: Secondary | ICD-10-CM | POA: Diagnosis present

## 2013-06-03 DIAGNOSIS — E1165 Type 2 diabetes mellitus with hyperglycemia: Secondary | ICD-10-CM

## 2013-06-03 DIAGNOSIS — M009 Pyogenic arthritis, unspecified: Secondary | ICD-10-CM | POA: Diagnosis present

## 2013-06-03 DIAGNOSIS — L03119 Cellulitis of unspecified part of limb: Secondary | ICD-10-CM | POA: Insufficient documentation

## 2013-06-03 DIAGNOSIS — IMO0002 Reserved for concepts with insufficient information to code with codable children: Secondary | ICD-10-CM

## 2013-06-03 DIAGNOSIS — D631 Anemia in chronic kidney disease: Secondary | ICD-10-CM | POA: Diagnosis present

## 2013-06-03 HISTORY — DX: Cellulitis of unspecified part of limb: L03.119

## 2013-06-03 HISTORY — DX: Cutaneous abscess of limb, unspecified: L02.419

## 2013-06-03 LAB — COMPREHENSIVE METABOLIC PANEL
ALBUMIN: 1.6 g/dL — AB (ref 3.5–5.2)
ALT: 14 U/L (ref 0–53)
AST: 13 U/L (ref 0–37)
Alkaline Phosphatase: 161 U/L — ABNORMAL HIGH (ref 39–117)
BUN: 84 mg/dL — AB (ref 6–23)
CALCIUM: 8.7 mg/dL (ref 8.4–10.5)
CO2: 24 mEq/L (ref 19–32)
CREATININE: 6.69 mg/dL — AB (ref 0.50–1.35)
Chloride: 94 mEq/L — ABNORMAL LOW (ref 96–112)
GFR calc Af Amer: 11 mL/min — ABNORMAL LOW (ref >90)
GFR calc non Af Amer: 9 mL/min — ABNORMAL LOW (ref >90)
Glucose, Bld: 211 mg/dL — ABNORMAL HIGH (ref 70–99)
Potassium: 4.6 mEq/L (ref 3.7–5.3)
Sodium: 134 mEq/L — ABNORMAL LOW (ref 137–147)
Total Bilirubin: 0.6 mg/dL (ref 0.3–1.2)
Total Protein: 7.6 g/dL (ref 6.0–8.3)

## 2013-06-03 LAB — CBC
HCT: 26 % — ABNORMAL LOW (ref 39.0–52.0)
Hemoglobin: 8.2 g/dL — ABNORMAL LOW (ref 13.0–17.0)
MCH: 26 pg (ref 26.0–34.0)
MCHC: 31.5 g/dL (ref 30.0–36.0)
MCV: 82.5 fL (ref 78.0–100.0)
Platelets: 331 10*3/uL (ref 150–400)
RBC: 3.15 MIL/uL — AB (ref 4.22–5.81)
RDW: 16.5 % — ABNORMAL HIGH (ref 11.5–15.5)
WBC: 14.8 10*3/uL — AB (ref 4.0–10.5)

## 2013-06-03 LAB — GLUCOSE, CAPILLARY
GLUCOSE-CAPILLARY: 148 mg/dL — AB (ref 70–99)
GLUCOSE-CAPILLARY: 173 mg/dL — AB (ref 70–99)
GLUCOSE-CAPILLARY: 239 mg/dL — AB (ref 70–99)
Glucose-Capillary: 238 mg/dL — ABNORMAL HIGH (ref 70–99)
Glucose-Capillary: 186 mg/dL — ABNORMAL HIGH (ref 70–99)
Glucose-Capillary: 138 mg/dL — ABNORMAL HIGH (ref 70–99)
Glucose-Capillary: 167 mg/dL — ABNORMAL HIGH (ref 70–99)

## 2013-06-03 LAB — MRSA PCR SCREENING: MRSA BY PCR: NEGATIVE

## 2013-06-03 LAB — TROPONIN I
Troponin I: <0.3 ng/mL (ref <0.30)
Troponin I: <0.3 ng/mL (ref <0.30)

## 2013-06-03 LAB — TSH: TSH: 0.097 u[IU]/mL — ABNORMAL LOW (ref 0.350–4.500)

## 2013-06-03 LAB — LACTIC ACID, PLASMA: Lactic Acid, Venous: 1.5 mmol/L (ref 0.5–2.2)

## 2013-06-03 LAB — HEMOGLOBIN A1C
HEMOGLOBIN A1C: 7.8 % — AB (ref <5.7)
MEAN PLASMA GLUCOSE: 177 mg/dL — AB (ref <117)

## 2013-06-03 LAB — PHOSPHORUS: PHOSPHORUS: 6.7 mg/dL — AB (ref 2.3–4.6)

## 2013-06-03 LAB — C-REACTIVE PROTEIN: CRP: 40.5 mg/dL — ABNORMAL HIGH (ref <0.60)

## 2013-06-03 LAB — MAGNESIUM: Magnesium: 2.5 mg/dL (ref 1.5–2.5)

## 2013-06-03 MED ORDER — ACETAMINOPHEN 650 MG RE SUPP
650.0000 mg | Freq: Four times a day (QID) | RECTAL | Status: DC | PRN
Start: 2013-06-03 — End: 2013-07-02

## 2013-06-03 MED ORDER — ONDANSETRON HCL 4 MG/2ML IJ SOLN: 4.0000 mg | Freq: Four times a day (QID) | INTRAMUSCULAR | Status: DC | PRN

## 2013-06-03 MED ORDER — LABETALOL HCL 100 MG PO TABS
100.0000 mg | ORAL_TABLET | Freq: Two times a day (BID) | ORAL | Status: DC
  Administered 2013-06-03 – 2013-06-07 (×7): 100 mg via ORAL
  Filled 2013-06-03 (×12): qty 1

## 2013-06-03 MED ORDER — SUFENTANIL CITRATE 50 MCG/ML IV SOLN
INTRAVENOUS | Status: DC | PRN
  Administered 2013-06-03: 20 ug via INTRAVENOUS

## 2013-06-03 MED ORDER — INSULIN ASPART 100 UNIT/ML ~~LOC~~ SOLN
0.0000 [IU] | SUBCUTANEOUS | Status: DC
  Administered 2013-06-03 (×2): 2 [IU] via SUBCUTANEOUS
  Administered 2013-06-03: 5 [IU] via SUBCUTANEOUS
  Administered 2013-06-03: 1 [IU] via SUBCUTANEOUS
  Administered 2013-06-03: 2 [IU] via SUBCUTANEOUS
  Administered 2013-06-04: 1 [IU] via SUBCUTANEOUS
  Administered 2013-06-05: 3 [IU] via SUBCUTANEOUS
  Administered 2013-06-05: 2 [IU] via SUBCUTANEOUS
  Administered 2013-06-05: 3 [IU] via SUBCUTANEOUS
  Administered 2013-06-05 (×3): 2 [IU] via SUBCUTANEOUS
  Administered 2013-06-06: 5 [IU] via SUBCUTANEOUS
  Administered 2013-06-06: 1 [IU] via SUBCUTANEOUS
  Administered 2013-06-06: 3 [IU] via SUBCUTANEOUS
  Administered 2013-06-06: 2 [IU] via SUBCUTANEOUS
  Administered 2013-06-07: 5 [IU] via SUBCUTANEOUS
  Administered 2013-06-07: 3 [IU] via SUBCUTANEOUS
  Administered 2013-06-07: 5 [IU] via SUBCUTANEOUS
  Administered 2013-06-07: 2 [IU] via SUBCUTANEOUS

## 2013-06-03 MED ORDER — ACETAMINOPHEN 325 MG PO TABS
650.0000 mg | ORAL_TABLET | Freq: Four times a day (QID) | ORAL | Status: DC | PRN
  Administered 2013-06-10 – 2013-06-21 (×4): 650 mg via ORAL
  Administered 2013-06-22: 325 mg via ORAL
  Administered 2013-06-24: 650 mg via ORAL
  Filled 2013-06-03 (×6): qty 2

## 2013-06-03 MED ORDER — SODIUM CHLORIDE 0.9 % IJ SOLN: 3.0000 mL | INTRAMUSCULAR | Status: DC | PRN

## 2013-06-03 MED ORDER — PIPERACILLIN-TAZOBACTAM IN DEX 2-0.25 GM/50ML IV SOLN
2.2500 g | Freq: Three times a day (TID) | INTRAVENOUS | Status: DC
  Administered 2013-06-03 – 2013-06-21 (×55): 2.25 g via INTRAVENOUS
  Filled 2013-06-03 (×63): qty 50

## 2013-06-03 MED ORDER — VANCOMYCIN HCL IN DEXTROSE 1-5 GM/200ML-% IV SOLN
1000.0000 mg | INTRAVENOUS | Status: DC
  Administered 2013-06-04 – 2013-06-06 (×2): 1000 mg via INTRAVENOUS
  Filled 2013-06-03 (×7): qty 200

## 2013-06-03 MED ORDER — OXYCODONE-ACETAMINOPHEN 5-325 MG PO TABS
1.0000 | ORAL_TABLET | ORAL | Status: DC | PRN
  Administered 2013-06-03: 1 via ORAL
  Administered 2013-06-03 – 2013-06-04 (×2): 2 via ORAL
  Administered 2013-06-07: 1 via ORAL
  Administered 2013-06-08 (×2): 2 via ORAL
  Administered 2013-06-08: 1 via ORAL
  Filled 2013-06-03 (×2): qty 1
  Filled 2013-06-03 (×3): qty 2
  Filled 2013-06-03: qty 1
  Filled 2013-06-03: qty 2

## 2013-06-03 MED ORDER — HYDROCODONE-ACETAMINOPHEN 5-325 MG PO TABS
1.0000 | ORAL_TABLET | ORAL | Status: DC | PRN
  Administered 2013-06-05 – 2013-06-06 (×5): 2 via ORAL
  Administered 2013-06-06 – 2013-06-07 (×3): 1 via ORAL
  Administered 2013-06-14 – 2013-06-18 (×11): 2 via ORAL
  Filled 2013-06-03: qty 2
  Filled 2013-06-03: qty 1
  Filled 2013-06-03 (×6): qty 2
  Filled 2013-06-03: qty 1
  Filled 2013-06-03 (×5): qty 2
  Filled 2013-06-03: qty 1
  Filled 2013-06-03 (×4): qty 2

## 2013-06-03 MED ORDER — VANCOMYCIN HCL 10 G IV SOLR: 1500.0000 mg | INTRAVENOUS | Status: DC

## 2013-06-03 MED ORDER — SODIUM CHLORIDE 0.9 % IV SOLN
125.0000 mg | Freq: Once | INTRAVENOUS | Status: AC
  Administered 2013-06-04: 125 mg via INTRAVENOUS
  Filled 2013-06-03 (×2): qty 10

## 2013-06-03 MED ORDER — ONDANSETRON HCL 4 MG PO TABS: 4.0000 mg | ORAL_TABLET | Freq: Four times a day (QID) | ORAL | Status: DC | PRN

## 2013-06-03 MED ORDER — ONDANSETRON HCL 4 MG/2ML IJ SOLN
INTRAMUSCULAR | Status: DC | PRN
  Administered 2013-06-03: 4 mg via INTRAVENOUS

## 2013-06-03 MED ORDER — VANCOMYCIN HCL IN DEXTROSE 1-5 GM/200ML-% IV SOLN
1000.0000 mg | Freq: Once | INTRAVENOUS | Status: AC
  Administered 2013-06-03: 1000 mg via INTRAVENOUS
  Filled 2013-06-03 (×2): qty 200

## 2013-06-03 MED ORDER — SODIUM CHLORIDE 0.9 % IJ SOLN: 3.0000 mL | Freq: Two times a day (BID) | INTRAMUSCULAR | Status: DC

## 2013-06-03 MED ORDER — SEVELAMER CARBONATE 800 MG PO TABS
800.0000 mg | ORAL_TABLET | Freq: Three times a day (TID) | ORAL | Status: DC
  Administered 2013-06-03 – 2013-06-22 (×34): 800 mg via ORAL
  Filled 2013-06-03 (×65): qty 1

## 2013-06-03 MED ORDER — PIPERACILLIN-TAZOBACTAM 3.375 G IVPB
3.3750 g | Freq: Three times a day (TID) | INTRAVENOUS | Status: DC
  Filled 2013-06-03 (×2): qty 50

## 2013-06-03 MED ORDER — DOCUSATE SODIUM 100 MG PO CAPS
100.0000 mg | ORAL_CAPSULE | Freq: Two times a day (BID) | ORAL | Status: DC
  Administered 2013-06-03 – 2013-06-21 (×21): 100 mg via ORAL
  Filled 2013-06-03 (×38): qty 1

## 2013-06-03 MED ORDER — PROPOFOL 10 MG/ML IV BOLUS
INTRAVENOUS | Status: DC | PRN
  Administered 2013-06-03: 200 mg via INTRAVENOUS

## 2013-06-03 MED ORDER — FENTANYL CITRATE 0.05 MG/ML IJ SOLN
INTRAMUSCULAR | Status: AC
  Filled 2013-06-03: qty 2

## 2013-06-03 MED ORDER — SODIUM CHLORIDE 0.9 % IV SOLN
INTRAVENOUS | Status: DC | PRN
  Administered 2013-06-02: via INTRAVENOUS

## 2013-06-03 MED ORDER — SODIUM CHLORIDE 0.9 % IV SOLN
INTRAVENOUS | Status: DC
  Administered 2013-06-06: 10 mL/h via INTRAVENOUS
  Administered 2013-06-09 – 2013-06-12 (×2): via INTRAVENOUS
  Administered 2013-06-18: 10 mL/h via INTRAVENOUS

## 2013-06-03 MED ORDER — OXYCODONE HCL 5 MG/5ML PO SOLN: 5.0000 mg | Freq: Once | ORAL | Status: DC | PRN

## 2013-06-03 MED ORDER — OXYCODONE HCL 5 MG PO TABS: 5.0000 mg | ORAL_TABLET | Freq: Once | ORAL | Status: DC | PRN

## 2013-06-03 MED ORDER — SODIUM CHLORIDE 0.9 % IV SOLN: 250.0000 mL | INTRAVENOUS | Status: DC | PRN

## 2013-06-03 MED ORDER — RENA-VITE PO TABS
1.0000 | ORAL_TABLET | Freq: Every day | ORAL | Status: DC
  Administered 2013-06-03 – 2013-06-15 (×12): 1 via ORAL
  Administered 2013-06-16 – 2013-06-17 (×2): via ORAL
  Administered 2013-06-18 – 2013-07-01 (×14): 1 via ORAL
  Filled 2013-06-03 (×31): qty 1

## 2013-06-03 MED ORDER — MORPHINE SULFATE 4 MG/ML IJ SOLN
4.0000 mg | INTRAMUSCULAR | Status: DC | PRN
  Administered 2013-06-04 – 2013-06-12 (×6): 4 mg via INTRAVENOUS
  Filled 2013-06-03 (×5): qty 1

## 2013-06-03 MED ORDER — SUCCINYLCHOLINE CHLORIDE 20 MG/ML IJ SOLN
INTRAMUSCULAR | Status: DC | PRN
  Administered 2013-06-03: 100 mg via INTRAVENOUS

## 2013-06-03 MED ORDER — ONDANSETRON HCL 4 MG/2ML IJ SOLN
INTRAMUSCULAR | Status: AC
  Filled 2013-06-03: qty 2

## 2013-06-03 MED ORDER — FENTANYL CITRATE 0.05 MG/ML IJ SOLN
25.0000 ug | INTRAMUSCULAR | Status: DC | PRN
  Administered 2013-06-03 (×2): 50 ug via INTRAVENOUS

## 2013-06-03 MED ORDER — LIDOCAINE HCL (CARDIAC) 20 MG/ML IV SOLN
INTRAVENOUS | Status: DC | PRN
  Administered 2013-06-03: 100 mg via INTRAVENOUS

## 2013-06-03 MED ORDER — VANCOMYCIN HCL 1000 MG IV SOLR
1000.0000 mg | INTRAVENOUS | Status: DC | PRN
  Administered 2013-06-02: 1000 mg via INTRAVENOUS

## 2013-06-03 NOTE — Anesthesia Procedure Notes (Signed)
Procedure Name: Intubation Date/Time: 06/03/2013 12:04 AM Performed by: Claris Che Pre-anesthesia Checklist: Patient identified, Emergency Drugs available, Suction available and Patient being monitored Patient Re-evaluated:Patient Re-evaluated prior to inductionOxygen Delivery Method: Circle system utilized Preoxygenation: Pre-oxygenation with 100% oxygen Intubation Type: IV induction, Rapid sequence and Cricoid Pressure applied Ventilation: Mask ventilation without difficulty Laryngoscope Size: Mac and 4 Grade View: Grade II Tube size: 8.0 mm Number of attempts: 1 Airway Equipment and Method: Stylet,  Oral airway and LTA kit utilized Placement Confirmation: ETT inserted through vocal cords under direct vision,  positive ETCO2 and breath sounds checked- equal and bilateral Secured at: 24 cm Tube secured with: Tape Dental Injury: Teeth and Oropharynx as per pre-operative assessment

## 2013-06-03 NOTE — Progress Notes (Signed)
ANTIBIOTIC CONSULT NOTE - INITIAL  Pharmacy Consult for Vancocin and Zosyn Indication: possible sepsis w/ grangrenous leg and septic joint  No Known Allergies  Patient Measurements: Height: 6\' 1"  (185.4 cm) Weight: 250 lb (113.399 kg) IBW/kg (Calculated) : 79.9  Vital Signs: Temp: 98.2 F (36.8 C) (05/10 0118) BP: 107/88 mmHg (05/10 0215) Pulse Rate: 105 (05/10 0215)  Labs:  Recent Labs  06/02/13 1423  WBC 15.6*  HGB 9.7*  PLT 368  CREATININE 5.70*   Estimated Creatinine Clearance: 22.1 ml/min (by C-G formula based on Cr of 5.7).   Microbiology: No results found for this or any previous visit (from the past 720 hour(s)).  Medical History: Past Medical History  Diagnosis Date  . Eczema   . Morbid obesity   . Gout   . HTN (hypertension)   . Dyslipidemia   . Pulmonary embolism     3  in  lungs  at  one time...  . Abscess     great toe  . Pancreatitis   . Arthritis   . Depression   . Diabetes mellitus   . Anemia   . Pneumonia     2-3 years ago  . Shortness of breath     ?? chest  cold he has now.  10/8- no SOB  . Renal hypertension     Hemo started  Sept 2014- MWF    Medications:  Prescriptions prior to admission  Medication Sig Dispense Refill  . labetalol (NORMODYNE) 200 MG tablet Take 200 mg by mouth 2 (two) times daily.      Marland Kitchen oxyCODONE-acetaminophen (PERCOCET/ROXICET) 5-325 MG per tablet Take 1-2 tablets by mouth every 4 (four) hours as needed for severe pain.  15 tablet  0  . sevelamer carbonate (RENVELA) 800 MG tablet Take 800 mg by mouth 3 (three) times daily with meals.       Scheduled:  . fentaNYL      . piperacillin-tazobactam (ZOSYN)  IV  2.25 g Intravenous Q8H  . vancomycin  1,000 mg Intravenous Once  . [START ON 06/04/2013] vancomycin  1,000 mg Intravenous Q M,W,F-HD    Assessment: 42yo male thought he had gout of ankle after several weeks of swelling but struck ankle on an object and it broke open and began draining, brought to OR for  emergent I&D of gangrenous leg, to begin IV ABX for septic ankle joint.  Goal of Therapy:  Pre-HD vanc level 15-25  Plan:  Rec'd vanc 1g and Zosyn 3.375 perioperatively; will give additional vanc 1g to complete load then 1g after each HD as well as Zosyn 2.25g IV Q8H and monitor CBC, Cx, levels prn.  Wynona Neat, PharmD, BCPS  06/03/2013,2:39 AM

## 2013-06-03 NOTE — Op Note (Signed)
NAME:  Steven Logan, Steven Logan NO.:  [id number]  MEDICAL RECORD NO.:  [id number]  LOCATION:  2S08C                        FACILITY:  [place]  PHYSICIAN:  Doran Heater. Veverly Fells, M.D. DATE OF BIRTH:  1970/08/01  DATE OF PROCEDURE:  06/02/2013 DATE OF DISCHARGE:                              OPERATIVE REPORT   PREOPERATIVE DIAGNOSIS:  Right posterior ankle wound with possible gas gangrene.  POSTOPERATIVE DIAGNOSIS:  Right posterior ankle abscess with right ankle septic arthritis.  PROCEDURE PERFORMED:  Right ankle arthroscopic I and D with open incision and drainage of posterior ankle wound and abscess and placement of close suction drain in right ankle joint.  ATTENDING SURGEON:  Doran Heater. Veverly Fells, M.D.  ASSISTANT:  None.  ANESTHESIA:  General anesthesia was used.  ESTIMATED BLOOD LOSS:  Less than 50 mL.  FLUID REPLACEMENT:  500 mL of crystalloid.  INSTRUMENT COUNTS:  Correct.  COMPLICATIONS:  There were no complications.  ANTIBIOTICS:  Perioperative antibiotics were given.  INDICATIONS:  The patient is a 43 year old diabetic who presents with draining wound on the posterior aspect of his ankle near his Achilles tendon.  MRI scan indicating potential gas and an abscess posterior to the ankle joint.  The patient has been seen a couple times in the emergency department, treated for gout, and placed on prednisone, and he noticed drainage and increased swelling and pain over the last several days.  He was seen in the emergency department, where x-rays were obtained, negative for any osteo and an MRI scan demonstrating the abscess.  The patient was taken emergently to surgery based on potential for gas.  Informed consent obtained.  DESCRIPTION OF PROCEDURE:  After an adequate level of anesthesia was achieved, patient was positioned supine on the operating table, right hip bumped up.  [name] ankle was acutely swollen.  I went ahead and aspirated with an 18-gauge  needle and obtained gross purulence from the ankle joint, which was well away from the location of the infection posterolaterally.  We went ahead at this time and moved towards an arthroscopic I and D of his ankle joint.  We cannulated both anteromedially and anterolaterally.  Skin incision just skin only and then blunt insertion of cannula into the joint and gross purulence returned.  Total of 6 L of normal saline irrigation in the ankle joint. We then made our hockey-stick incision over the posterolateral ankle centered on the wound area immediately encountering gross pus, pus tracked subcutaneously as well as deep behind the Achilles and back up underneath Achilles and the calf muscles on the posterior tibia.  I could definitely feel the ankle joint.  I think this started as a septic joint initially and then forced off the back and became an abscess.  I did not palpate any crepitus in the soft tissues.  It is possible that the air pocket was simply an open wound with air getting into the wound, but we irrigated with 12 L of normal saline irrigation and debrided sharply with a knife and rongeur.  At the end, we had nice clean wound. We placed closed suction Hemovac drain in the ankle joint, closed the ankle joint with interrupted nylon closure and then  posteriorly closed our hockey-stick incision except for the central portion, which we placed a sponge moistened with normal saline back into the deepest portion of the wound as a packing that will be pulled out on Monday morning.  The patient will no doubt have to go back to surgery for subsequent washout and inspection of viable versus nonviable tissue, but definitely felt better about the leg after the I and D.  [name] foot was placed in a sterile compressive bandage the foot and ankle, and the patient's leg was elevated and Hemovac drain hooked to suction.  The patient tolerated the surgery well.     Doran Heater. Veverly Fells,  M.D.     SRN/MEDQ  D:  06/03/2013  T:  06/03/2013  Job:  [id number]

## 2013-06-03 NOTE — Progress Notes (Signed)
[facility] PROGRESS NOTE  Assessment/Plan: Severe sepsis due to septic ankle & Abscess of right lower leg: - Bp has improved along with tachycardia. - Started on empiric antibiotics Vanc and zosyn 5.10.2015. - S/p I and D on   5.10.2015, with possible second visit to the OR on 5.11.2015. - cultures pending. Sleepy  Encephalopathy, toxic: - still confused and sleepy. - NPO except for meds  Anemia in chronic kidney disease - per renal.  End stage renal disease - consult renal.  HTN (hypertension): - d'ed due to borderline Bp.    Code Status: Full Family Communication: none  Disposition Plan: inpatient   Consultants:  Ortho  Procedures:  renal  Antibiotics:  vanc and zosyn (indicate start date, and stop date if known)  HPI/Subjective: Sleepy but arousable no complains.  Objective: Filed Vitals:   06/03/13 0400 06/03/13 0500 06/03/13 0600 06/03/13 0700  BP: 111/50 119/56 102/64 115/67  Pulse: 99 101 100 101  Temp:      TempSrc:      Resp: 12 15 23 14   Height:      Weight:      SpO2: 99% 99% 100% 99%    Intake/Output Summary (Last 24 hours) at 06/03/13 0824 Last data filed at 06/03/13 0535  Gross per 24 hour  Intake   1000 ml  Output      0 ml  Net   1000 ml   Filed Weights   06/02/13 1309 06/03/13 0335  Weight: 113.399 kg (250 lb) 109.9 kg (242 lb 4.6 oz)    Exam:  General: Alert, awake, oriented x3, in no acute distress.  HEENT: No bruits, no goiter.  Heart: Regular rate and rhythm, without murmurs, rubs, gallops.  Lungs: Good air movement, bilateral air movement.  Abdomen: Soft, nontender, nondistended, positive bowel sounds.  Neuro: Grossly intact, nonfocal.   Data Reviewed: Basic Metabolic Panel:  Recent Labs Lab 05/29/13 1616 06/02/13 1423 06/03/13 0725  NA 139 136* 134*  K 3.8 4.5 4.6  CL 96 92* 94*  CO2 25 23 24   GLUCOSE 343* 260* 211*  BUN 46* 65* 84*  CREATININE 5.18* 5.70* 6.69*  CALCIUM 8.7 9.3 8.7  MG   --   --  2.5  PHOS  --   --  6.7*   Liver Function Tests:  Recent Labs Lab 05/29/13 1616 06/02/13 1423 06/03/13 0725  AST 8 15 13   ALT 9 17 14   ALKPHOS 101 166* 161*  BILITOT 0.3 0.5 0.6  PROT 7.9 8.3 7.6  ALBUMIN 2.3* 1.9* 1.6*   No results found for this basename: LIPASE, AMYLASE,  in the last 168 hours No results found for this basename: AMMONIA,  in the last 168 hours CBC:  Recent Labs Lab 05/29/13 1616 06/02/13 1423 06/03/13 0725  WBC 21.7* 15.6* 14.8*  NEUTROABS 19.1*  --   --   HGB 9.3* 9.7* 8.2*  HCT 29.5* 30.7* 26.0*  MCV 84.3 82.5 82.5  PLT 404* 368 331   Cardiac Enzymes:  Recent Labs Lab 06/03/13 0725  TROPONINI <0.30   BNP (last 3 results) No results found for this basename: PROBNP,  in the last 8760 hours CBG:  Recent Labs Lab 06/03/13 0122 06/03/13 0441  GLUCAP 238* 239*    Recent Results (from the past 240 hour(s))  MRSA PCR SCREENING     Status: None   Collection Time    06/03/13  3:35 AM      Result Value Ref Range Status  MRSA by PCR NEGATIVE  NEGATIVE Final   Comment:            The GeneXpert MRSA Assay (FDA     approved for NASAL specimens     only), is one component of a     comprehensive MRSA colonization     surveillance program. It is not     intended to diagnose MRSA     infection nor to guide or     monitor treatment for     MRSA infections.     Studies: Dg Chest 2 View  06/02/2013   CLINICAL DATA:  Chest pain.  EXAM: CHEST  2 VIEW  COMPARISON:  05/29/2013 and 05/04/2012 as well as chest CT 05/29/2013  FINDINGS: The lungs are hypoinflated with mild interval improvement in right base opacification likely improving effusions/atelectasis. Cannot exclude infection in the right base. Cardiomediastinal silhouette and remainder of the exam is unchanged.  IMPRESSION: Improving right base process likely effusion and atelectasis. Cannot completely exclude infection in the right base.   Electronically Signed   By: Marin Olp  M.D.   On: 06/02/2013 15:19   Dg Ankle Complete Right  06/02/2013   CLINICAL DATA:  Edema, swelling, open wound lateral right ankle  EXAM: RIGHT ANKLE - COMPLETE 3+ VIEW  COMPARISON:  None.  FINDINGS: There is no acute fracture no subluxation. The bones are osteopenic likely secondary to hypovolemia. Soft tissue swelling is again appreciated. There is no evidence of cortical destruction.  IMPRESSION: No acute osseous abnormalities.  Diffuse soft tissue swelling.   Electronically Signed   By: Margaree Mackintosh M.D.   On: 06/02/2013 16:54   Mr Ankle Right  Wo Contrast  06/02/2013   CLINICAL DATA:  Right lateral ankle wound.  EXAM: MRI OF THE RIGHT ANKLE WITHOUT CONTRAST  TECHNIQUE: Multiplanar, multisequence MR imaging of the ankle was performed. No intravenous contrast was administered.  COMPARISON:  None.  FINDINGS: There is a large fluid collection in the dorsal ankle, which is suspicious for abscess. There appear to be due tiny loculated gas bubbles within this collection. This is heterogeneous and dissects around the posterior aspect of the ankle.  Craniocaudal extent of this collection as 11.4 cm. Transverse measurement is 7.9 cm. AP thickness is difficult to measure because of the crescentic shaped conforming to the posterior aspect of the ankle but measures at least 3 cm in maximal thickness. This likely abscess infiltrates between the Achilles tendon, the posterior medial tendons and lies dorsal to the peroneal tendons. Diffuse subcutaneous edema is present in the distal leg and ankle extending to the forefoot.  Bone marrow signal is markedly abnormal, with heterogeneous signal in the distal tibia and fibula, probably due to renal osteodystrophy. Osteochondral lesion of the medial talar dome is present with subchondral marrow edema. No fracture is present. Large ankle effusion. Minimal fluid is present in the posterior medial and posterior lateral tendon sheaths.  IMPRESSION: Large heterogeneous fluid  collection in the posterior ankle, anterior to the Achilles tendon consistent with abscess. There appear to be tiny loculated gas centrally, suggesting infection with gas-forming organism.  No convincing evidence of osteomyelitis. Large ankle effusion. Septic arthritis cannot be excluded.   Electronically Signed   By: Dereck Ligas M.D.   On: 06/02/2013 22:09    Scheduled Meds: . docusate sodium  100 mg Oral BID  . fentaNYL      . insulin aspart  0-9 Units Subcutaneous 6 times per day  . labetalol  100 mg Oral BID  . piperacillin-tazobactam (ZOSYN)  IV  2.25 g Intravenous Q8H  . sevelamer carbonate  800 mg Oral TID WC  . [START ON 06/04/2013] vancomycin  1,000 mg Intravenous Q M,W,F-HD   Continuous Infusions:    [facility] [place] [facility] Pager [phone]. If 8PM-8AM, please contact night-coverage at [email or web address], password [facility] 06/03/2013, 8:24 AM  LOS: 1 day

## 2013-06-03 NOTE — ED Notes (Signed)
Property sheets sent to [id number] per OR staff instructions.

## 2013-06-03 NOTE — H&P (Signed)
PCP: Tivis Ringer, MD    Chief Complaint:  Right ankle pain and swelling  HPI: Steven Logan is a 43 y.o. male   has a past medical history of Eczema; Morbid obesity; Gout; HTN (hypertension); Dyslipidemia; Pulmonary embolism; Abscess; Pancreatitis; Arthritis; Depression; Diabetes mellitus; Anemia; Pneumonia; Shortness of breath; and Renal hypertension.   Presented with  Patient have had hx of right ankle swelling for 2 week at first it was diagnosed as gout and he was started on prednisone without improvement. Patient reports that he has bumped his leg and the wound opened up and started to ooze pus. Patietn presented to ER complianing of generalized fatigue and joint aches. An MRI of the ankle was ordered showing gas-containing abscess and cellulitis. At which point orthopedics has been called patient was taken emergently to operation room for debridement. Postoperatively patient was somewhat somnolent and noted to be tachycardic. He is arousable to verbal stimuli but falls back asleep. Not reporting any current pain. States he had had right arm swelling as well for the past 2 days. Patient is on hemodialysis on Monday Wednesday Friday. On Friday he did not go for dialysis secondary to not feeling well.  Hospitalist was called for admission for sepsis due to ankle abscess  Review of Systems:    Pertinent positives include: fatigue, lethargy joint pain arm and leg swelling  Constitutional:  No weight loss, night sweats, Fevers, chills,  weight loss  HEENT:  No headaches, Difficulty swallowing,Tooth/dental problems,Sore throat,  No sneezing, itching, ear ache, nasal congestion, post nasal drip,  Cardio-vascular:  No chest pain, Orthopnea, PND, anasarca, dizziness, palpitations.no Bilateral lower extremity swelling  GI:  No heartburn, indigestion, abdominal pain, nausea, vomiting, diarrhea, change in bowel habits, loss of appetite, melena, blood in stool, hematemesis Resp:  no  shortness of breath at rest. No dyspnea on exertion, No excess mucus, no productive cough, No non-productive cough, No coughing up of blood.No change in color of mucus.No wheezing. Skin:  no rash or lesions. No jaundice GU:  no dysuria, change in color of urine, no urgency or frequency. No straining to urinate.  No flank pain.  Musculoskeletal:  No joint pain or no joint swelling. No decreased range of motion. No back pain.  Psych:  No change in mood or affect. No depression or anxiety. No memory loss.  Neuro: no localizing neurological complaints, no tingling, no weakness, no double vision, no gait abnormality, no slurred speech, no confusion  Otherwise ROS are negative except for above, 10 systems were reviewed  Past Medical History: Past Medical History  Diagnosis Date  . Eczema   . Morbid obesity   . Gout   . HTN (hypertension)   . Dyslipidemia   . Pulmonary embolism     3  in  lungs  at  one time...  . Abscess     great toe  . Pancreatitis   . Arthritis   . Depression   . Diabetes mellitus   . Anemia   . Pneumonia     2-3 years ago  . Shortness of breath     ?? chest  cold he has now.  10/8- no SOB  . Renal hypertension     Hemo started  Sept 2014- MWF   Past Surgical History  Procedure Laterality Date  . None    . Av fistula placement Left 04/07/2012    Procedure: ARTERIOVENOUS (AV) FISTULA CREATION;  Surgeon: Angelia Mould, MD;  Location: [facility];  Service: Vascular;  Laterality: Left;  . Ligation of competing branches of arteriovenous fistula Left 09/19/2012    Procedure: LIGATION OF COMPETING BRANCHES OF LEFT ARM ARTERIOVENOUS FISTULA; Vein angioplasty;  Surgeon: Angelia Mould, MD;  Location: [facility];  Service: Vascular;  Laterality: Left;  . Insertion of dialysis catheter Right 09/19/2012    Procedure: INSERTION OF DIALYSIS CATHETER;  Surgeon: Angelia Mould, MD;  Location: [facility] OR;  Service: Vascular;  Laterality: Right;      Medications: Prior to Admission medications   Medication Sig Start Date End Date Taking? Authorizing Provider  labetalol (NORMODYNE) 200 MG tablet Take 200 mg by mouth 2 (two) times daily.   Yes Historical Provider, MD  oxyCODONE-acetaminophen (PERCOCET/ROXICET) 5-325 MG per tablet Take 1-2 tablets by mouth every 4 (four) hours as needed for severe pain. 05/23/13  Yes Shari A Upstill, PA-C  sevelamer carbonate (RENVELA) 800 MG tablet Take 800 mg by mouth 3 (three) times daily with meals.   Yes Historical Provider, MD    Allergies:  No Known Allergies  Social History:     reports that he has never smoked. He has never used smokeless tobacco. He reports that he does not drink alcohol or use illicit drugs.    Family History: family history includes Cancer in an other family member; Colon cancer (age of onset: 37) in his maternal grandmother; Coronary artery disease in an other family member; Diabetes in his mother; Hypertension in his brother, father, and mother; Stroke in his mother.    Physical Exam: Patient Vitals for the past 24 hrs:  BP Temp Temp src Pulse Resp SpO2 Height Weight  06/03/13 0251 127/63 mmHg - - 108 15 100 % - -  06/03/13 0230 121/53 mmHg - - 107 17 100 % - -  06/03/13 0215 107/88 mmHg - - 105 15 100 % - -  06/03/13 0200 124/62 mmHg - - 104 14 100 % - -  06/03/13 0145 132/52 mmHg - - 106 15 99 % - -  06/03/13 0130 125/53 mmHg - - 104 16 100 % - -  06/03/13 0118 146/71 mmHg 98.2 F (36.8 C) - 110 14 97 % - -  06/02/13 2300 151/61 mmHg - - 109 - 92 % - -  06/02/13 2230 126/55 mmHg - - 107 - 91 % - -  06/02/13 2217 136/58 mmHg - - 108 20 92 % - -  06/02/13 2200 136/58 mmHg - - 111 - 96 % - -  06/02/13 2130 130/60 mmHg - - 106 - 95 % - -  06/02/13 1930 135/50 mmHg - - 103 - 99 % - -  06/02/13 1857 119/62 mmHg - - 120 18 100 % - -  06/02/13 1738 118/69 mmHg - - 121 18 100 % - -  06/02/13 1700 135/75 mmHg - - 113 - 97 % - -  06/02/13 1634 140/68 mmHg - -  112 18 99 % - -  06/02/13 1517 133/66 mmHg - - 107 22 98 % - -  06/02/13 1309 - - - - - - 6\' 1"  (1.854 m) 113.399 kg (250 lb)  06/02/13 1300 127/73 mmHg 98.1 F (36.7 C) Oral 99 22 100 % - -    1. General:  in No Acute distress 2. Psychological: lethargic but Oriented 3. Head/ENT:   Moist  Mucous Membranes                          Head Non traumatic,  neck supple                          Normal   Dentition 4. SKIN: normal Skin turgor,  Skin clean Dry swelling above the dressing on the right leg noted. Painful to palpation, drain in place. Right arm swelling noted as well.  5. Heart: rapid but regular rate and rhythm no Murmur, Rub or gallop 6. Lungs: Clear to auscultation bilaterally, no wheezes or crackles   7. Abdomen: Soft, non-tender, Non distended 8. Lower extremities: no clubbing, cyanosis, or edema 9. Neurologically Grossly intact, moving all 4 extremities equally 10. MSK: Normal range of motion  body mass index is 32.99 kg/(m^2).   Labs on Admission:   Recent Labs  06/02/13 1423  NA 136*  K 4.5  CL 92*  CO2 23  GLUCOSE 260*  BUN 65*  CREATININE 5.70*  CALCIUM 9.3    Recent Labs  06/02/13 1423  AST 15  ALT 17  ALKPHOS 166*  BILITOT 0.5  PROT 8.3  ALBUMIN 1.9*   No results found for this basename: LIPASE, AMYLASE,  in the last 72 hours  Recent Labs  06/02/13 1423  WBC 15.6*  HGB 9.7*  HCT 30.7*  MCV 82.5  PLT 368   No results found for this basename: CKTOTAL, CKMB, CKMBINDEX, TROPONINI,  in the last 72 hours No results found for this basename: TSH, T4TOTAL, FREET3, T3FREE, THYROIDAB,  in the last 72 hours No results found for this basename: VITAMINB12, FOLATE, FERRITIN, TIBC, IRON, RETICCTPCT,  in the last 72 hours No results found for this basename: HGBA1C    Estimated Creatinine Clearance: 22.1 ml/min (by C-G formula based on Cr of 5.7). ABG    Component Value Date/Time   TCO2 18 08/07/2012 0609     No results found for this basename:  DDIMER     Other results:  I have pearsonaly reviewed this: ECG REPORT  Rate:102  Rhythm: ST with L ventricular hypertrophy ST&T Change: no ischemia  BNP (last 3 results) No results found for this basename: PROBNP,  in the last 8760 hours  Filed Weights   06/02/13 1309  Weight: 113.399 kg (250 lb)     Cultures:    Component Value Date/Time   SDES BLOOD ARM RIGHT 10/09/2012 1040   SPECREQUEST BOTTLES DRAWN AEROBIC ONLY [facility] 10/09/2012 1040   CULT  Value: NO GROWTH 5 DAYS Performed at [facility] 10/09/2012 1040   REPTSTATUS 10/15/2012 FINAL 10/09/2012 1040         Radiological Exams on Admission: Dg Chest 2 View  06/02/2013   CLINICAL DATA:  Chest pain.  EXAM: CHEST  2 VIEW  COMPARISON:  05/29/2013 and 05/04/2012 as well as chest CT 05/29/2013  FINDINGS: The lungs are hypoinflated with mild interval improvement in right base opacification likely improving effusions/atelectasis. Cannot exclude infection in the right base. Cardiomediastinal silhouette and remainder of the exam is unchanged.  IMPRESSION: Improving right base process likely effusion and atelectasis. Cannot completely exclude infection in the right base.   Electronically Signed   By: Marin Olp M.D.   On: 06/02/2013 15:19   Dg Ankle Complete Right  06/02/2013   CLINICAL DATA:  Edema, swelling, open wound lateral right ankle  EXAM: RIGHT ANKLE - COMPLETE 3+ VIEW  COMPARISON:  None.  FINDINGS: There is no acute fracture no subluxation. The bones are osteopenic likely secondary to hypovolemia. Soft tissue swelling is again appreciated. There is  no evidence of cortical destruction.  IMPRESSION: No acute osseous abnormalities.  Diffuse soft tissue swelling.   Electronically Signed   By: Margaree Mackintosh M.D.   On: 06/02/2013 16:54   Mr Ankle Right  Wo Contrast  06/02/2013   CLINICAL DATA:  Right lateral ankle wound.  EXAM: MRI OF THE RIGHT ANKLE WITHOUT CONTRAST  TECHNIQUE: Multiplanar, multisequence MR imaging of the  ankle was performed. No intravenous contrast was administered.  COMPARISON:  None.  FINDINGS: There is a large fluid collection in the dorsal ankle, which is suspicious for abscess. There appear to be due tiny loculated gas bubbles within this collection. This is heterogeneous and dissects around the posterior aspect of the ankle.  Craniocaudal extent of this collection as 11.4 cm. Transverse measurement is 7.9 cm. AP thickness is difficult to measure because of the crescentic shaped conforming to the posterior aspect of the ankle but measures at least 3 cm in maximal thickness. This likely abscess infiltrates between the Achilles tendon, the posterior medial tendons and lies dorsal to the peroneal tendons. Diffuse subcutaneous edema is present in the distal leg and ankle extending to the forefoot.  Bone marrow signal is markedly abnormal, with heterogeneous signal in the distal tibia and fibula, probably due to renal osteodystrophy. Osteochondral lesion of the medial talar dome is present with subchondral marrow edema. No fracture is present. Large ankle effusion. Minimal fluid is present in the posterior medial and posterior lateral tendon sheaths.  IMPRESSION: Large heterogeneous fluid collection in the posterior ankle, anterior to the Achilles tendon consistent with abscess. There appear to be tiny loculated gas centrally, suggesting infection with gas-forming organism.  No convincing evidence of osteomyelitis. Large ankle effusion. Septic arthritis cannot be excluded.   Electronically Signed   By: Dereck Ligas M.D.   On: 06/02/2013 22:09    Chart has been reviewed  Assessment/Plan  43 yo M with hx of DM and ESRD here with sepsis due to right leg abscess  Present on Admission:  . Abscess of right lower leg - drained by orthopedics, drain in place, broad spectrum antibiotics, vancomycin and zosyn . Altered mental status - post operative, in the setting of infection, also given ESRD could have poor  clearance of anesthesia, see if improves after HD.  Marland Kitchen HTN (hypertension) - restart labetalol but at lower doses . End stage renal disease - will need to alert nephrology for HD in AM as patient have skipped his last HD.  Marland Kitchen Anemia in chronic kidney disease - chronic, continue to follow . Arm edema - obtain doppler to eval for DVT   Prophylaxis: SCD  Post operatively, Protonix  CODE STATUS:  FULL CODE    Other plan as per orders.  I have spent a total of 55 min on this admission  Lonna Rabold 06/03/2013, 2:45 AM

## 2013-06-03 NOTE — Transfer of Care (Signed)
Immediate Anesthesia Transfer of Care Note  Patient: Steven Logan  Procedure(s) Performed: Procedure(s): IRRIGATION AND DEBRIDEMENT ARTHROSCOPIC RIGHT ANKLE (Right)  Patient Location: PACU  Anesthesia Type:General  Level of Consciousness: sedated, patient cooperative and responds to stimulation  Airway & Oxygen Therapy: Patient Spontanous Breathing and Patient connected to nasal cannula oxygen  Post-op Assessment: Report given to PACU RN, Post -op Vital signs reviewed and stable and Patient moving all extremities X 4  Post vital signs: Reviewed and stable  Complications: No apparent anesthesia complications

## 2013-06-03 NOTE — Brief Op Note (Signed)
06/02/2013 - 06/03/2013  1:22 AM  PATIENT:  Steven Logan  43 y.o. male  PRE-OPERATIVE DIAGNOSIS:  gangrenous right leg, possible gas  POST-OPERATIVE DIAGNOSIS:  gangrenous right leg, septic right ankle joint  PROCEDURE:  Procedure(s): IRRIGATION AND DEBRIDEMENT ARTHROSCOPIC RIGHT ANKLE (Right), open incision and drainage of the right posterior leg/ankle, placement of drain in ankle joint  SURGEON:  Surgeon(s) and Role:    * Augustin Schooling, MD - Primary  PHYSICIAN ASSISTANT:   ASSISTANTS: none   ANESTHESIA:   general  EBL:  Total I/O In: 250 [I.V.:250] Out: -   BLOOD ADMINISTERED:none  DRAINS: Medium hemovac drain placed in the right ankle joint and sewn in   LOCAL MEDICATIONS USED:  NONE  SPECIMEN:  Source of Specimen:  right ankle joint pus  DISPOSITION OF SPECIMEN:  micro  COUNTS:  YES  TOURNIQUET:  * No tourniquets in log *  DICTATION: .Other Dictation: Dictation Number [id number]  PLAN OF CARE: Admit to inpatient   PATIENT DISPOSITION:  PACU - guarded condition.   Delay start of Pharmacological VTE agent (>24hrs) due to surgical blood loss or risk of bleeding: no

## 2013-06-03 NOTE — Progress Notes (Signed)
D;  Lt lower arm, AV shunt thrill.

## 2013-06-03 NOTE — Progress Notes (Signed)
Orthopedics Progress Note  Subjective: I feel so weak.  Objective:  Filed Vitals:   06/03/13 0700  BP: 115/67  Pulse: 101  Temp:   Resp: 14    General: Awake and alert  Musculoskeletal: right LE dressed and elevated, HV charged. Swelling in the right UE unchanged Neurovascularly intact  Lab Results  Component Value Date   WBC 14.8* 06/03/2013   HGB 8.2* 06/03/2013   HCT 26.0* 06/03/2013   MCV 82.5 06/03/2013   PLT 331 06/03/2013       Component Value Date/Time   NA 136* 06/02/2013 1423   K 4.5 06/02/2013 1423   CL 92* 06/02/2013 1423   CO2 23 06/02/2013 1423   GLUCOSE 260* 06/02/2013 1423   BUN 65* 06/02/2013 1423   CREATININE 5.70* 06/02/2013 1423   CALCIUM 9.3 06/02/2013 1423   GFRNONAA 11* 06/02/2013 1423   GFRAA 13* 06/02/2013 1423    Lab Results  Component Value Date   INR 1.03 11/02/2012   INR 1.47 10/09/2012   INR 1.15 09/19/2012    Assessment/Plan: POD #1 s/p Procedure(s): IRRIGATION AND DEBRIDEMENT ARTHROSCOPIC RIGHT ANKLE, I+D right leg abscess Continue broad spectrum antibiotics.  Will need second washout tomorrow in the OR Elevate leg  Right UE doppler pending, history of prior VTE Npo after breakfast tomorrow  Doran Heater. Veverly Fells, MD 06/03/2013 8:05 AM

## 2013-06-03 NOTE — ED Notes (Signed)
Pt.'s home meds sent to pharmacy. Pt.'s valuables sent to security safe. Pt's clothes and brown bag sent to OR holding with Pt.

## 2013-06-03 NOTE — Consult Note (Signed)
[facility] Renal Consultation Note  Indication for Consultation:  Management of ESRD/hemodialysis; anemia, hypertension/volume and secondary hyperparathyroidism  HPI: Steven Logan is a 43 y.o. AA male with a history of hypertension, diabetes, pulmonary embolism, gout, and ESRD on dialysis at the [facility] who presented to the ED yesterday with worsening right ankle swelling and pain for two weeks and with pustular discharge since trauma to the area on 5/5.  He did not feel well enough to go to his dialysis on 5/6, but was evaluated yesterday at dialysis and was noted to have purulent discharge from his right ankle wound.  He was given IV Vancomycin and Cefazidime post-dialysis and instructed to go to the ED.  Evaluation with MRI suggested an abscess in the posterior ankle with possible gas gangrene, so emergent incision and drainage with surgical debridement was done early this morning and IV antibiotics were continued.  The patient also notes right upper extremity swelling for the last two days, so a Venous Duplex of his right arm has been ordered.  His right lower leg is wrapped and very swollen post-surgery, and although he is drowsy, his main complaint is that he is hungry.  He will be monitored and may require additional irrigation and debridement in the OR tomorrow.  Dialysis Orders:  MWF @ [facility] 4:30        111.5 kg        2K/2.5Ca          500/800       Heparin 11,000 U      AVF @ LFA    Calcitriol 1.25 mcg on MWF         Epogen 8000 U       Venofer 100 mg x 8 (through 5/11)  Past Medical History  Diagnosis Date  . Eczema   . Morbid obesity   . Gout   . HTN (hypertension)   . Dyslipidemia   . Pulmonary embolism     3  in  lungs  at  one time...  . Abscess     great toe  . Pancreatitis   . Arthritis   . Depression   . Diabetes mellitus   . Anemia   . Pneumonia     2-3 years ago  . Shortness of breath     ?? chest  cold he has now.  10/8- no SOB   . Renal hypertension     Hemo started  Sept 2014- MWF   Past Surgical History  Procedure Laterality Date  . None    . Av fistula placement Left 04/07/2012    Procedure: ARTERIOVENOUS (AV) FISTULA CREATION;  Surgeon: Angelia Mould, MD;  Location: [place];  Service: Vascular;  Laterality: Left;  . Ligation of competing branches of arteriovenous fistula Left 09/19/2012    Procedure: LIGATION OF COMPETING BRANCHES OF LEFT ARM ARTERIOVENOUS FISTULA; Vein angioplasty;  Surgeon: Angelia Mould, MD;  Location: [place];  Service: Vascular;  Laterality: Left;  . Insertion of dialysis catheter Right 09/19/2012    Procedure: INSERTION OF DIALYSIS CATHETER;  Surgeon: Angelia Mould, MD;  Location: [facility] OR;  Service: Vascular;  Laterality: Right;   Family History  Problem Relation Age of Onset  . Stroke Mother   . Diabetes Mother   . Hypertension Mother   . Cancer    . Coronary artery disease    . Hypertension Father   . Colon cancer Maternal Grandmother 35  . Hypertension Brother  Social History He denies any history of tobacco or illicit drug use and rarely drinks alcohol.  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  No Known Allergies Prior to Admission medications   Medication Sig Start Date End Date Taking? Authorizing Provider  labetalol (NORMODYNE) 200 MG tablet Take 200 mg by mouth 2 (two) times daily.   Yes Historical Provider, MD  oxyCODONE-acetaminophen (PERCOCET/ROXICET) 5-325 MG per tablet Take 1-2 tablets by mouth every 4 (four) hours as needed for severe pain. 05/23/13  Yes Shari A Upstill, PA-C  sevelamer carbonate (RENVELA) 800 MG tablet Take 800 mg by mouth 3 (three) times daily with meals.   Yes Historical Provider, MD   Labs:  Results for orders placed during the hospital encounter of 06/02/13 (from the past 48 hour(s))  CBC     Status: Abnormal   Collection Time    06/02/13  2:23 PM      Result Value Ref Range   WBC 15.6 (*) 4.0 - 10.5 K/uL   RBC  3.72 (*) 4.22 - 5.81 MIL/uL   Hemoglobin 9.7 (*) 13.0 - 17.0 g/dL   HCT 30.7 (*) 39.0 - 52.0 %   MCV 82.5  78.0 - 100.0 fL   MCH 26.1  26.0 - 34.0 pg   MCHC 31.6  30.0 - 36.0 g/dL   RDW 16.3 (*) 11.5 - 15.5 %   Platelets 368  150 - 400 K/uL  COMPREHENSIVE METABOLIC PANEL     Status: Abnormal   Collection Time    06/02/13  2:23 PM      Result Value Ref Range   Sodium 136 (*) 137 - 147 mEq/L   Potassium 4.5  3.7 - 5.3 mEq/L   Chloride 92 (*) 96 - 112 mEq/L   CO2 23  19 - 32 mEq/L   Glucose, Bld 260 (*) 70 - 99 mg/dL   BUN 65 (*) 6 - 23 mg/dL   Creatinine, Ser 5.70 (*) 0.50 - 1.35 mg/dL   Calcium 9.3  8.4 - 10.5 mg/dL   Total Protein 8.3  6.0 - 8.3 g/dL   Albumin 1.9 (*) 3.5 - 5.2 g/dL   AST 15  0 - 37 U/L   ALT 17  0 - 53 U/L   Alkaline Phosphatase 166 (*) 39 - 117 U/L   Total Bilirubin 0.5  0.3 - 1.2 mg/dL   GFR calc non Af Amer 11 (*) >90 mL/min   GFR calc Af Amer 13 (*) >90 mL/min   Comment: (NOTE)     The eGFR has been calculated using the CKD EPI equation.     This calculation has not been validated in all clinical situations.     eGFR's persistently <90 mL/min signify possible Chronic Kidney     Disease.  SEDIMENTATION RATE     Status: Abnormal   Collection Time    06/02/13  9:10 PM      Result Value Ref Range   Sed Rate 135 (*) 0 - 16 mm/hr  C-REACTIVE PROTEIN     Status: Abnormal   Collection Time    06/02/13  9:10 PM      Result Value Ref Range   CRP 40.5 (*) <0.60 mg/dL   Comment: (NOTE)     Result repeated and verified.     Result confirmed by automatic dilution.     Performed at [place], [facility]     Status: Abnormal   Collection Time    06/03/13  1:22 AM      Result Value Ref Range   Glucose-Capillary 238 (*) 70 - 99 mg/dL  MRSA PCR SCREENING     Status: None   Collection Time    06/03/13  3:35 AM      Result Value Ref Range   MRSA by PCR NEGATIVE  NEGATIVE   Comment:            The GeneXpert MRSA Assay (FDA     approved for  NASAL specimens     only), is one component of a     comprehensive MRSA colonization     surveillance program. It is not     intended to diagnose MRSA     infection nor to guide or     monitor treatment for     MRSA infections.  GLUCOSE, [facility]     Status: Abnormal   Collection Time    06/03/13  4:41 AM      Result Value Ref Range   Glucose-Capillary 239 (*) 70 - 99 mg/dL   Comment 1 Documented in Chart     Comment 2 Notify RN    MAGNESIUM     Status: None   Collection Time    06/03/13  7:25 AM      Result Value Ref Range   Magnesium 2.5  1.5 - 2.5 mg/dL  PHOSPHORUS     Status: Abnormal   Collection Time    06/03/13  7:25 AM      Result Value Ref Range   Phosphorus 6.7 (*) 2.3 - 4.6 mg/dL  TSH     Status: Abnormal   Collection Time    06/03/13  7:25 AM      Result Value Ref Range   TSH 0.097 (*) 0.350 - 4.500 uIU/mL   Comment: Please note change in reference range.  COMPREHENSIVE METABOLIC PANEL     Status: Abnormal   Collection Time    06/03/13  7:25 AM      Result Value Ref Range   Sodium 134 (*) 137 - 147 mEq/L   Potassium 4.6  3.7 - 5.3 mEq/L   Chloride 94 (*) 96 - 112 mEq/L   CO2 24  19 - 32 mEq/L   Glucose, Bld 211 (*) 70 - 99 mg/dL   BUN 84 (*) 6 - 23 mg/dL   Creatinine, Ser 6.69 (*) 0.50 - 1.35 mg/dL   Calcium 8.7  8.4 - 10.5 mg/dL   Total Protein 7.6  6.0 - 8.3 g/dL   Albumin 1.6 (*) 3.5 - 5.2 g/dL   AST 13  0 - 37 U/L   ALT 14  0 - 53 U/L   Alkaline Phosphatase 161 (*) 39 - 117 U/L   Total Bilirubin 0.6  0.3 - 1.2 mg/dL   GFR calc non Af Amer 9 (*) >90 mL/min   GFR calc Af Amer 11 (*) >90 mL/min   Comment: (NOTE)     The eGFR has been calculated using the CKD EPI equation.     This calculation has not been validated in all clinical situations.     eGFR's persistently <90 mL/min signify possible Chronic Kidney     Disease.  CBC     Status: Abnormal   Collection Time    06/03/13  7:25 AM      Result Value Ref Range   WBC 14.8 (*) 4.0 - 10.5 K/uL    RBC 3.15 (*) 4.22 - 5.81 MIL/uL   Hemoglobin 8.2 (*)  13.0 - 17.0 g/dL   HCT 26.0 (*) 39.0 - 52.0 %   MCV 82.5  78.0 - 100.0 fL   MCH 26.0  26.0 - 34.0 pg   MCHC 31.5  30.0 - 36.0 g/dL   RDW 16.5 (*) 11.5 - 15.5 %   Platelets 331  150 - 400 K/uL  TROPONIN I     Status: None   Collection Time    06/03/13  7:25 AM      Result Value Ref Range   Troponin I <0.30  <0.30 ng/mL   Comment:            Due to the release kinetics of cTnI,     a negative result within the first hours     of the onset of symptoms does not rule out     myocardial infarction with certainty.     If myocardial infarction is still suspected,     repeat the test at appropriate intervals.  GLUCOSE, [facility]     Status: Abnormal   Collection Time    06/03/13  7:54 AM      Result Value Ref Range   Glucose-Capillary 186 (*) 70 - 99 mg/dL  LACTIC ACID, PLASMA     Status: None   Collection Time    06/03/13  9:20 AM      Result Value Ref Range   Lactic Acid, Venous 1.5  0.5 - 2.2 mmol/L  TROPONIN I     Status: None   Collection Time    06/03/13  9:20 AM      Result Value Ref Range   Troponin I <0.30  <0.30 ng/mL   Comment:            Due to the release kinetics of cTnI,     a negative result within the first hours     of the onset of symptoms does not rule out     myocardial infarction with certainty.     If myocardial infarction is still suspected,     repeat the test at appropriate intervals.   Constitutional: negative for chills, fatigue, fevers and sweats Ears, nose, mouth, throat, and face: negative for earaches, hoarseness, nasal congestion and sore throat Respiratory: negative for cough, dyspnea on exertion, hemoptysis and sputum Cardiovascular: negative for chest pain, chest pressure/discomfort, dyspnea, orthopnea and palpitations Gastrointestinal: negative for abdominal pain, change in bowel habits, nausea and vomiting Genitourinary:negative, oliguric Musculoskeletal:negative for arthralgias, back  pain, muscle weakness and myalgias Neurological: negative for dizziness, gait problems, headaches and speech problems  Physical Exam: Filed Vitals:   06/03/13 0800  BP: 133/61  Pulse: 95  Temp: 98.4 F (36.9 C)  Resp: 14     General appearance: alert, cooperative, in no apparent distress Head: Normocephalic, without obvious abnormality, atraumatic Neck: no adenopathy, no carotid bruit, no JVD and supple, symmetrical, trachea midline Resp: clear to auscultation bilaterally Cardio: regular rate and rhythm, S1, S2 normal, no murmur, click, rub or gallop GI: soft, non-tender; bowel sounds normal; no masses,  no organomegaly Extremities: R ankle and foot wrapped with 1-2 + pretibial edema, trace edema on L Bilat edema of hands and wrists with mild tenderness and mild pain w wrist movement, no tophi Neurologic: Grossly normal Dialysis Access: AVF @ LFA with + bruit   Assessment/Plan: 1. R posterior ankle abscess - s/p I & D with debridement today by Dr. Veverly Fells; on Vancomycin & Zosyn, cultures pending. Evaluate for possible I & D tomorrow. 2. ESRD -  HD on MWF @ [place], K 4.6.  HD tomorrow. 3. Hypertension/volume - BP 133/61 on outpatient Labetalol 200 mg bid (now 100 mg); wt 109.9 kg, below EDW, no edema per CXR. Below his dry wt 4. Anemia - Hgb down to 8.2 post-surgery; outpatient Epogen & Fe.  Aranesp 100 mcg, IV Fe tomorrow (last dose of loading). 5. Metabolic bone disease - Ca 8.7 (10.6 corrected), P 6.7,iPTH 371; Calcitriol 1.25 mcg MWF, Renvela 3 with meals.  Hold calcitriol, use 2Ca bath. 6. Nutrition - Alb 1.6, renal diet, vitamin. 7. RUE swelling - Hx PE, Venous Doppler pending. 8. DM - on insulin.  Ramiro Harvest 06/03/2013, 11:36 AM   Attending Nephrologist: Roney Jaffe, MD  Pt seen, examined and agree w A/P as above. 43 yo with hx of DM2, ESRD on HD, gout, pancreatitis, PE and HTN. He started HD in Sept 2014.  He received some prednisone for a suspected gout flare of the R  foot recently then started draining pus from the foot.  He was admitted and taken to OR today where he had a septic joint that ortho felt was the initial event that developed into a posterior abcess. Post op the patient had a wound vac.  He is complaining of R foot pain but also pain "all over" especially the hands, wrists areas.  He has some jt swelling in the wrists and this may be a concomitant gout flare.  He is stable from a renal standpoint. Will have HD tomorrow and is supposed to back to OR also tomorrow.  Kelly Splinter MD pager [phone]    cell [phone] 06/03/2013, 8:24 PM

## 2013-06-03 NOTE — ED Provider Notes (Signed)
I saw and evaluated the patient, reviewed the resident's note and I agree with the findings and plan.  Please see my separate note regarding my evaluation of the patient.    Johnna Acosta, MD 06/03/13 [phone]

## 2013-06-04 ENCOUNTER — Encounter (HOSPITAL_COMMUNITY): Payer: BC Managed Care – PPO | Admitting: Certified Registered Nurse Anesthetist

## 2013-06-04 ENCOUNTER — Inpatient Hospital Stay (HOSPITAL_COMMUNITY): Payer: BC Managed Care – PPO | Admitting: Certified Registered Nurse Anesthetist

## 2013-06-04 ENCOUNTER — Encounter (HOSPITAL_COMMUNITY)
Admission: EM | Disposition: A | Discharge status: Skilled Nursing Facility | Payer: Self-pay | Source: Home / Self Care | Attending: Internal Medicine

## 2013-06-04 ENCOUNTER — Encounter (HOSPITAL_COMMUNITY): Payer: Self-pay | Admitting: *Deleted

## 2013-06-04 DIAGNOSIS — M7989 Other specified soft tissue disorders: Secondary | ICD-10-CM

## 2013-06-04 HISTORY — PX: I & D EXTREMITY: SHX5045

## 2013-06-04 LAB — GLUCOSE, CAPILLARY
GLUCOSE-CAPILLARY: 119 mg/dL — AB (ref 70–99)
GLUCOSE-CAPILLARY: 116 mg/dL — AB (ref 70–99)
Glucose-Capillary: 139 mg/dL — ABNORMAL HIGH (ref 70–99)
Glucose-Capillary: 125 mg/dL — ABNORMAL HIGH (ref 70–99)

## 2013-06-04 LAB — RENAL FUNCTION PANEL
Albumin: 1.5 g/dL — ABNORMAL LOW (ref 3.5–5.2)
BUN: 102 mg/dL — ABNORMAL HIGH (ref 6–23)
CALCIUM: 8.7 mg/dL (ref 8.4–10.5)
CO2: 21 meq/L (ref 19–32)
CREATININE: 8.19 mg/dL — AB (ref 0.50–1.35)
Chloride: 91 mEq/L — ABNORMAL LOW (ref 96–112)
GFR calc Af Amer: 8 mL/min — ABNORMAL LOW (ref >90)
GFR calc non Af Amer: 7 mL/min — ABNORMAL LOW (ref >90)
GLUCOSE: 141 mg/dL — AB (ref 70–99)
PHOSPHORUS: 8.4 mg/dL — AB (ref 2.3–4.6)
Potassium: 4.6 mEq/L (ref 3.7–5.3)
Sodium: 134 mEq/L — ABNORMAL LOW (ref 137–147)

## 2013-06-04 LAB — PREPARE RBC (CROSSMATCH)

## 2013-06-04 LAB — CBC
HCT: 23.9 % — ABNORMAL LOW (ref 39.0–52.0)
HEMOGLOBIN: 7.5 g/dL — AB (ref 13.0–17.0)
MCH: 25.7 pg — AB (ref 26.0–34.0)
MCHC: 31.4 g/dL (ref 30.0–36.0)
MCV: 81.8 fL (ref 78.0–100.0)
Platelets: 300 10*3/uL (ref 150–400)
RBC: 2.92 MIL/uL — ABNORMAL LOW (ref 4.22–5.81)
RDW: 16.5 % — ABNORMAL HIGH (ref 11.5–15.5)
WBC: 17.4 10*3/uL — ABNORMAL HIGH (ref 4.0–10.5)

## 2013-06-04 LAB — ABO/RH: ABO/RH(D): A POS

## 2013-06-04 SURGERY — IRRIGATION AND DEBRIDEMENT EXTREMITY
Anesthesia: General | Site: Ankle | Laterality: Right

## 2013-06-04 MED ORDER — METOCLOPRAMIDE HCL 5 MG/ML IJ SOLN
5.0000 mg | Freq: Three times a day (TID) | INTRAMUSCULAR | Status: DC | PRN
  Filled 2013-06-04: qty 1

## 2013-06-04 MED ORDER — LIDOCAINE HCL (PF) 1 % IJ SOLN: 5.0000 mL | INTRAMUSCULAR | Status: DC | PRN

## 2013-06-04 MED ORDER — SODIUM CHLORIDE 0.9 % IV SOLN
100.0000 mL | INTRAVENOUS | Status: DC | PRN
  Administered 2013-06-04 (×2): via INTRAVENOUS

## 2013-06-04 MED ORDER — HYDROMORPHONE HCL PF 1 MG/ML IJ SOLN
0.5000 mg | INTRAMUSCULAR | Status: DC | PRN
  Administered 2013-06-04 – 2013-06-18 (×22): 1 mg via INTRAVENOUS
  Filled 2013-06-04 (×19): qty 1

## 2013-06-04 MED ORDER — FENTANYL CITRATE 0.05 MG/ML IJ SOLN
INTRAMUSCULAR | Status: DC | PRN
Start: 2013-06-04 — End: 2013-06-04
  Administered 2013-06-04: 100 ug via INTRAVENOUS
  Administered 2013-06-04: 50 ug via INTRAVENOUS
  Administered 2013-06-04 (×2): 100 ug via INTRAVENOUS

## 2013-06-04 MED ORDER — MORPHINE SULFATE 4 MG/ML IJ SOLN
INTRAMUSCULAR | Status: AC
  Filled 2013-06-04: qty 1

## 2013-06-04 MED ORDER — ONDANSETRON HCL 4 MG/2ML IJ SOLN
INTRAMUSCULAR | Status: DC | PRN
  Administered 2013-06-04: 4 mg via INTRAVENOUS

## 2013-06-04 MED ORDER — ALTEPLASE 2 MG IJ SOLR
2.0000 mg | Freq: Once | INTRAMUSCULAR | Status: AC | PRN
  Filled 2013-06-04: qty 2

## 2013-06-04 MED ORDER — MIDAZOLAM HCL 5 MG/5ML IJ SOLN
INTRAMUSCULAR | Status: DC | PRN
  Administered 2013-06-04: 2 mg via INTRAVENOUS

## 2013-06-04 MED ORDER — CHLORHEXIDINE GLUCONATE 4 % EX LIQD: 60.0000 mL | Freq: Once | CUTANEOUS | Status: DC

## 2013-06-04 MED ORDER — NEPRO/CARBSTEADY PO LIQD: 237.0000 mL | ORAL | Status: DC | PRN

## 2013-06-04 MED ORDER — HEPARIN (PORCINE) IN NACL 100-0.45 UNIT/ML-% IJ SOLN
1450.0000 [IU]/h | INTRAMUSCULAR | Status: DC
  Administered 2013-06-04 – 2013-06-05 (×2): 1200 [IU]/h via INTRAVENOUS
  Filled 2013-06-04 (×3): qty 250

## 2013-06-04 MED ORDER — FENTANYL CITRATE 0.05 MG/ML IJ SOLN
INTRAMUSCULAR | Status: AC
  Filled 2013-06-04: qty 5

## 2013-06-04 MED ORDER — LIDOCAINE HCL (CARDIAC) 20 MG/ML IV SOLN
INTRAVENOUS | Status: DC | PRN
Start: 2013-06-04 — End: 2013-06-04
  Administered 2013-06-04: 100 mg via INTRAVENOUS

## 2013-06-04 MED ORDER — CEFAZOLIN SODIUM-DEXTROSE 2-3 GM-% IV SOLR
2.0000 g | INTRAVENOUS | Status: DC
  Filled 2013-06-04: qty 50

## 2013-06-04 MED ORDER — SODIUM CHLORIDE 0.9 % IR SOLN
Status: DC | PRN
  Administered 2013-06-04: 3000 mL

## 2013-06-04 MED ORDER — ONDANSETRON HCL 4 MG PO TABS
4.0000 mg | ORAL_TABLET | Freq: Four times a day (QID) | ORAL | Status: DC | PRN
Start: 2013-06-04 — End: 2013-06-24
  Administered 2013-06-12: 4 mg via ORAL
  Filled 2013-06-04: qty 1

## 2013-06-04 MED ORDER — LIDOCAINE-PRILOCAINE 2.5-2.5 % EX CREA
1.0000 "application " | TOPICAL_CREAM | CUTANEOUS | Status: DC | PRN
  Filled 2013-06-04: qty 5

## 2013-06-04 MED ORDER — SODIUM CHLORIDE 0.9 % IV SOLN: 100.0000 mL | INTRAVENOUS | Status: DC | PRN

## 2013-06-04 MED ORDER — MIDAZOLAM HCL 2 MG/2ML IJ SOLN
INTRAMUSCULAR | Status: AC
  Filled 2013-06-04: qty 2

## 2013-06-04 MED ORDER — PROPOFOL 10 MG/ML IV BOLUS
INTRAVENOUS | Status: AC
  Filled 2013-06-04: qty 20

## 2013-06-04 MED ORDER — HYDROMORPHONE HCL PF 1 MG/ML IJ SOLN
0.2500 mg | INTRAMUSCULAR | Status: DC | PRN
  Administered 2013-06-04: 0.5 mg via INTRAVENOUS

## 2013-06-04 MED ORDER — METOCLOPRAMIDE HCL 5 MG PO TABS
5.0000 mg | ORAL_TABLET | Freq: Three times a day (TID) | ORAL | Status: DC | PRN
  Filled 2013-06-04: qty 1

## 2013-06-04 MED ORDER — PENTAFLUOROPROP-TETRAFLUOROETH EX AERO: 1.0000 "application " | INHALATION_SPRAY | CUTANEOUS | Status: DC | PRN

## 2013-06-04 MED ORDER — ONDANSETRON HCL 4 MG/2ML IJ SOLN
4.0000 mg | Freq: Four times a day (QID) | INTRAMUSCULAR | Status: DC | PRN
Start: 2013-06-04 — End: 2013-06-24
  Administered 2013-06-11: 4 mg via INTRAVENOUS
  Filled 2013-06-04: qty 2

## 2013-06-04 MED ORDER — HEPARIN SODIUM (PORCINE) 1000 UNIT/ML DIALYSIS
1000.0000 [IU] | INTRAMUSCULAR | Status: DC | PRN
  Filled 2013-06-04: qty 1

## 2013-06-04 MED ORDER — HYDROMORPHONE HCL PF 1 MG/ML IJ SOLN
INTRAMUSCULAR | Status: AC
  Filled 2013-06-04: qty 1

## 2013-06-04 MED ORDER — PROPOFOL 10 MG/ML IV BOLUS
INTRAVENOUS | Status: DC | PRN
  Administered 2013-06-04: 200 mg via INTRAVENOUS

## 2013-06-04 SURGICAL SUPPLY — 43 items
BLADE SURG 10 STRL SS (BLADE) ×3 IMPLANT
BNDG COHESIVE 4X5 TAN STRL (GAUZE/BANDAGES/DRESSINGS) IMPLANT
BNDG COHESIVE 6X5 TAN STRL LF (GAUZE/BANDAGES/DRESSINGS) ×3 IMPLANT
BNDG GAUZE ELAST 4 BULKY (GAUZE/BANDAGES/DRESSINGS) ×3 IMPLANT
COVER SURGICAL LIGHT HANDLE (MISCELLANEOUS) ×3 IMPLANT
CUFF TOURNIQUET SINGLE 18IN (TOURNIQUET CUFF) IMPLANT
CUFF TOURNIQUET SINGLE 24IN (TOURNIQUET CUFF) IMPLANT
CUFF TOURNIQUET SINGLE 34IN LL (TOURNIQUET CUFF) IMPLANT
CUFF TOURNIQUET SINGLE 44IN (TOURNIQUET CUFF) IMPLANT
DRAPE U-SHAPE 47X51 STRL (DRAPES) ×3 IMPLANT
DRSG ADAPTIC 3X8 NADH LF (GAUZE/BANDAGES/DRESSINGS) ×3 IMPLANT
DURAPREP 26ML APPLICATOR (WOUND CARE) ×3 IMPLANT
ELECT CAUTERY BLADE 6.4 (BLADE) ×3 IMPLANT
ELECT REM PT RETURN 9FT ADLT (ELECTROSURGICAL)
ELECTRODE REM PT RTRN 9FT ADLT (ELECTROSURGICAL) IMPLANT
GLOVE BIOGEL PI IND STRL 9 (GLOVE) ×1 IMPLANT
GLOVE BIOGEL PI INDICATOR 9 (GLOVE) ×2
GLOVE SURG ORTHO 9.0 STRL STRW (GLOVE) ×3 IMPLANT
GOWN STRL REUS W/ TWL XL LVL3 (GOWN DISPOSABLE) ×2 IMPLANT
GOWN STRL REUS W/TWL XL LVL3 (GOWN DISPOSABLE) ×4
HANDPIECE INTERPULSE COAX TIP (DISPOSABLE) ×2
KIT BASIN OR (CUSTOM PROCEDURE TRAY) ×3 IMPLANT
KIT ROOM TURNOVER OR (KITS) ×3 IMPLANT
MANIFOLD NEPTUNE II (INSTRUMENTS) ×3 IMPLANT
NS IRRIG 1000ML POUR BTL (IV SOLUTION) ×3 IMPLANT
PACK ORTHO EXTREMITY (CUSTOM PROCEDURE TRAY) ×3 IMPLANT
PAD ARMBOARD 7.5X6 YLW CONV (MISCELLANEOUS) ×6 IMPLANT
PADDING CAST COTTON 6X4 STRL (CAST SUPPLIES) ×3 IMPLANT
SET HNDPC FAN SPRY TIP SCT (DISPOSABLE) ×1 IMPLANT
SPONGE GAUZE 4X4 12PLY (GAUZE/BANDAGES/DRESSINGS) ×3 IMPLANT
SPONGE GAUZE 4X4 12PLY STER LF (GAUZE/BANDAGES/DRESSINGS) ×3 IMPLANT
SPONGE LAP 18X18 X RAY DECT (DISPOSABLE) ×6 IMPLANT
STOCKINETTE IMPERVIOUS 9X36 MD (GAUZE/BANDAGES/DRESSINGS) IMPLANT
SUT ETHILON 2 0 PSLX (SUTURE) ×3 IMPLANT
SWAB CULTURE LIQUID MINI MALE (MISCELLANEOUS) ×6 IMPLANT
TOWEL OR 17X24 6PK STRL BLUE (TOWEL DISPOSABLE) ×3 IMPLANT
TOWEL OR 17X26 10 PK STRL BLUE (TOWEL DISPOSABLE) ×3 IMPLANT
TUBE ANAEROBIC SPECIMEN COL (MISCELLANEOUS) ×6 IMPLANT
TUBE CONNECTING 12'X1/4 (SUCTIONS) ×1
TUBE CONNECTING 12X1/4 (SUCTIONS) ×2 IMPLANT
UNDERPAD 30X30 INCONTINENT (UNDERPADS AND DIAPERS) ×3 IMPLANT
WATER STERILE IRR 1000ML POUR (IV SOLUTION) ×3 IMPLANT
YANKAUER SUCT BULB TIP NO VENT (SUCTIONS) ×3 IMPLANT

## 2013-06-04 NOTE — Anesthesia Postprocedure Evaluation (Signed)
Anesthesia Post Note  Patient: Steven Logan  Procedure(s) Performed: Procedure(s) (LRB): IRRIGATION AND DEBRIDEMENT ARTHROSCOPIC RIGHT ANKLE (Right)  Anesthesia type: General  Patient location: PACU  Post pain: Pain level controlled and Adequate analgesia  Post assessment: Post-op Vital signs reviewed, Patient's Cardiovascular Status Stable, Respiratory Function Stable, Patent Airway and Pain level controlled  Last Vitals:  Filed Vitals:   06/04/13 0749  BP: 119/80  Pulse: 99  Temp: 36.7 C  Resp: 22    Post vital signs: Reviewed and stable  Level of consciousness: awake, alert  and oriented  Complications: No apparent anesthesia complications

## 2013-06-04 NOTE — Anesthesia Preprocedure Evaluation (Addendum)
Anesthesia Evaluation  Patient identified by MRN, date of birth, ID band Patient awake    Reviewed: Allergy & Precautions, H&P , NPO status , Patient's Chart, lab work & pertinent test results, reviewed documented beta blocker date and time   History of Anesthesia Complications Negative for: history of anesthetic complications  Airway Mallampati: II      Dental  (+) Dental Advisory Given, Teeth Intact   Pulmonary shortness of breath, PE breath sounds clear to auscultation        Cardiovascular hypertension, Pt. on home beta blockers Rhythm:Regular Rate:Tachycardia     Neuro/Psych PSYCHIATRIC DISORDERS Depression    GI/Hepatic   Endo/Other  diabetesMorbid obesity  Renal/GU ESRF and DialysisRenal disease M/W/F     Musculoskeletal  (+) Arthritis -,   Abdominal   Peds  Hematology  (+) anemia ,   Anesthesia Other Findings R arm swollen, L arm c AVVG  Reproductive/Obstetrics                         Anesthesia Physical Anesthesia Plan  ASA: III  Anesthesia Plan: General   Post-op Pain Management:    Induction: Intravenous  Airway Management Planned: Oral ETT and LMA  Additional Equipment:   Intra-op Plan:   Post-operative Plan: Extubation in OR  Informed Consent: I have reviewed the patients History and Physical, chart, labs and discussed the procedure including the risks, benefits and alternatives for the proposed anesthesia with the patient or authorized representative who has indicated his/her understanding and acceptance.   Dental advisory given  Plan Discussed with: CRNA, Anesthesiologist and Surgeon  Anesthesia Plan Comments: (Little or no IV access. Thrombophlebitis in R arm, but refuses neck line at this time. Placed iv in L foot after talking c patient.)       Anesthesia Quick Evaluation

## 2013-06-04 NOTE — Op Note (Signed)
OPERATIVE REPORT  DATE OF SURGERY: 06/04/2013  PATIENT:  Steven Logan,  43 y.o. 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 Irrigation of the right ankle through open incisions. Partial excision of the muscles of the deep superficial and lateral compartments right leg.   SURGEON:  Surgeon(s): Newt Minion, MD  ANESTHESIA:   general  EBL:  min ML  SPECIMEN:  Cultures obtained x2 both aerobic and anaerobic  TOURNIQUET:  * No tourniquets in log *  PROCEDURE DETAILS: Patient is a 43 year old gentleman who states he's had a prolonged infection of the right foot and ankle. Patient was admitted and underwent emergent initial irrigation and debridement within incision posteriorly over the Achilles and 2 arthroscopic portals over the ankle. Patient underwent irrigation and debridement and is seen today for followup. Examination today showed purulence draining from both surgical wounds and patient was brought urgently back to the operating room for further debridement. Risks and benefits were discussed with the patient including the potential loss of limb patient states he understands and wished to proceed at this time. Description of procedure patient was brought to the operating room and underwent a general anesthetic. After adequate levels of anesthesia were obtained patient's right lower extremity was prepped using DuraPrep draped into a sterile field. The arthroscopic portals were extended for open incisions which were approximately 3 cm in length this was carried down to the ankle joint and this was irrigated with pulsatile lavage there was no further purulence within the ankle joint.  With pulsatile lavage the ankle joint did not seem to communicate with the deep posterior abscess. The posterior lateral incision was extended proximally and the purulent abscess extended up to the popliteal fossa the incision was started at the calcaneus and extended up to the popliteal fossa. Patient had necrotic fascia nonviable muscle and massive amounts of purulence. The fascia from the lateral compartment deep and superficial posterior compartments was excised. There is necrotic muscle involving all compartments and the gastrocnemius and soleus muscle were debrided approximately 50% of the muscle mass. The deep compartment was also debrided approximately 50% of its muscle mass. The lateral compartment  muscle tissue was not excised. There is very minimal contractility to the muscles that are remaining. The wound was then irrigated with pulsatile lavage from the popliteal fossa to the calcaneus. There is no further purulence. Hemostasis was obtained. The wound was packed open with 4 x 4's Kerlix and ABDs and Coban. Patient was extubated taken to the PACU in stable condition anticipate repeat irrigation and debridement either tomorrow or Wednesday. Patient is at risk of loss of limb with possible above-the-knee amputation.  PLAN OF CARE: Admit to inpatient   PATIENT DISPOSITION:  PACU - hemodynamically stable.   Newt Minion, MD 06/04/2013 6:23 PM

## 2013-06-04 NOTE — Anesthesia Postprocedure Evaluation (Signed)
Anesthesia Post Note  Patient: NICOLE MCGOVERN  Procedure(s) Performed: Procedure(s) (LRB): IRRIGATION AND DEBRIDEMENT RIGHT FOOT/ANKLE (Right)  Anesthesia type: general  Patient location: PACU  Post pain: Pain level controlled  Post assessment: Patient's Cardiovascular Status Stable  Last Vitals:  Filed Vitals:   06/04/13 2032  BP: 137/79  Pulse: 111  Temp: 36.9 C  Resp: 23    Post vital signs: Reviewed and stable  Level of consciousness: sedated  Complications: No apparent anesthesia complications

## 2013-06-04 NOTE — Progress Notes (Signed)
Hemo- Pt tolerating procedure well. Informed pt is to be NPO for afternoon surgery. Hgb results this am 7.5, Dr. Justin Mend informed. Order to transfuse 2 units on HD. T&Cross sent. Continue to monitor patient

## 2013-06-04 NOTE — Progress Notes (Signed)
[facility] TEAM 1 - Stepdown/ICU TEAM Progress Note  Steven Logan [id number] DOB: 12/23/1970 DOA: 06/02/2013 PCP: Tivis Ringer, MD  Admit HPI / Brief Narrative: 43 y.o. M w/ a history of Morbid obesity; Gout; HTN; Dyslipidemia; Pulmonary embolism; Pancreatitis; Arthritis; Depression; Diabetes mellitus; and chronic anemia who presented with right ankle swelling for 2 weeks.  At first it was diagnosed as gout and he was started on prednisone without improvement. Patient reported that he bumped his leg and a wound opened up and started to ooze pus. Patient subsequently presented to the ER.  An MRI of the ankle revealed gas-containing abscess and cellulitis. Orthopedics took him emergently to the OR for debridement. Pt also reported right arm swelling for 2 days.    HPI/Subjective: Pt is talking on the phone when I enter the room.  He chose not to end his call and therefore an interview was not able to be accomplished.  Pt was examined.  He did not appear to be in signif pain.    Assessment/Plan:  Severe sepsis due to septic ankle & abscess of right lower leg - Bp has improved along with tachycardia.  - cont empiric antibiotics  - S/p I and D on 5.10.2015, with second visit to the OR today   - cultures revealing staph - await sensitivities   Acute nonocclusive DVT RUE - jugular, subclavian, axillary, and brachial veins involved per prelim report - avoid access/blood draws in R arm - will need anticoag once cleared from surgical standpoint - pt will need eventual hypercoag w/u, unless hx of recent catheter in affected arm can be elicited  Encephalopathy, toxic - appears to be improving as pt was talking on phone during exam  Anemia in chronic kidney disease  - per Renal  End stage renal disease  - consult Renal  Hypertension - BP stable at present   Code Status: FULL Family Communication: no family present at time of exam Disposition Plan:  SDU  Consultants: Ortho  Procedures: I and D on 06/03/2013  Antibiotics: Vanc 5/9 > Zosyn 5/9 >  DVT prophylaxis: SCDs - to begin IV heparin when cleared by Surgery   Objective: Blood pressure 149/85, pulse 103, temperature 97.6 F (36.4 C), temperature source Oral, resp. rate 16, height 6\' 1"  (1.854 m), weight 110.5 kg (243 lb 9.7 oz), SpO2 97.00%.  Intake/Output Summary (Last 24 hours) at 06/04/13 1530 Last data filed at 06/04/13 1230  Gross per 24 hour  Intake    610 ml  Output   2778 ml  Net  -2168 ml   Exam: General: No acute respiratory distress Lungs: Clear to auscultation bilaterally without wheezes or crackles Cardiovascular: Regular rate and rhythm without murmur gallop or rub  Abdomen: Nontender, nondistended, soft, bowel sounds positive, no rebound, no ascites, no appreciable mass Extremities: No significant cyanosis, clubbing, or edema bilateral lower extremities  Data Reviewed: Basic Metabolic Panel:  Recent Labs Lab 05/29/13 1616 06/02/13 1423 06/03/13 0725 06/04/13 0800  NA 139 136* 134* 134*  K 3.8 4.5 4.6 4.6  CL 96 92* 94* 91*  CO2 25 23 24 21   GLUCOSE 343* 260* 211* 141*  BUN 46* 65* 84* 102*  CREATININE 5.18* 5.70* 6.69* 8.19*  CALCIUM 8.7 9.3 8.7 8.7  MG  --   --  2.5  --   PHOS  --   --  6.7* 8.4*   Liver Function Tests:  Recent Labs Lab 05/29/13 1616 06/02/13 1423 06/03/13 0725 06/04/13 0800  AST 8  15 13  --   ALT 9 17 14   --   ALKPHOS 101 166* 161*  --   BILITOT 0.3 0.5 0.6  --   PROT 7.9 8.3 7.6  --   ALBUMIN 2.3* 1.9* 1.6* 1.5*   CBC:  Recent Labs Lab 05/29/13 1616 06/02/13 1423 06/03/13 0725 06/04/13 0800  WBC 21.7* 15.6* 14.8* 17.4*  NEUTROABS 19.1*  --   --   --   HGB 9.3* 9.7* 8.2* 7.5*  HCT 29.5* 30.7* 26.0* 23.9*  MCV 84.3 82.5 82.5 81.8  PLT 404* 368 331 300   Cardiac Enzymes:  Recent Labs Lab 06/03/13 0725 06/03/13 0920 06/03/13 1515  TROPONINI <0.30 <0.30 <0.30   CBG:  Recent Labs Lab  06/03/13 1216 06/03/13 1632 06/03/13 1939 06/03/13 2342 06/04/13 0330  GLUCAP 138* 167* 148* 173* 139*    Recent Results (from the past 240 hour(s))  CULTURE, BLOOD (ROUTINE X 2)     Status: None   Collection Time    06/02/13 10:50 PM      Result Value Ref Range Status   Specimen Description BLOOD LEFT ARM   Final   Special Requests BOTTLES DRAWN AEROBIC AND ANAEROBIC 10CC EACH   Final   Culture  Setup Time     Final   Value: 06/03/2013 12:30     Performed at [facility]   Culture     Final   Value:        BLOOD CULTURE RECEIVED NO GROWTH TO DATE CULTURE WILL BE HELD FOR 5 DAYS BEFORE ISSUING A FINAL NEGATIVE REPORT     Performed at [facility]   Report Status PENDING   Incomplete  CULTURE, BLOOD (ROUTINE X 2)     Status: None   Collection Time    06/02/13 10:55 PM      Result Value Ref Range Status   Specimen Description BLOOD LEFT HAND   Final   Special Requests BOTTLES DRAWN AEROBIC ONLY 10CC   Final   Culture  Setup Time     Final   Value: 06/03/2013 12:30     Performed at [facility]   Culture     Final   Value:        BLOOD CULTURE RECEIVED NO GROWTH TO DATE CULTURE WILL BE HELD FOR 5 DAYS BEFORE ISSUING A FINAL NEGATIVE REPORT     Performed at [facility]   Report Status PENDING   Incomplete  CULTURE, ROUTINE-ABSCESS     Status: None   Collection Time    06/03/13 12:20 AM      Result Value Ref Range Status   Specimen Description ABSCESS RIGHT FOOT   Final   Special Requests ANKLE PATIENT ON FOLLOWING ZOSYN VANCOMYCIN   Final   Gram Stain     Final   Value: ABUNDANT WBC PRESENT, PREDOMINANTLY PMN     RARE SQUAMOUS EPITHELIAL CELLS PRESENT     ABUNDANT GRAM POSITIVE COCCI IN PAIRS     IN CLUSTERS     Performed at [facility]   Culture     Final   Value: MODERATE STAPHYLOCOCCUS AUREUS     Note: RIFAMPIN AND GENTAMICIN SHOULD NOT BE USED AS SINGLE DRUGS FOR TREATMENT OF STAPH INFECTIONS.     Performed at [facility]   Report Status PENDING   Incomplete  ANAEROBIC CULTURE     Status: None   Collection Time    06/03/13 12:20 AM  Result Value Ref Range Status   Specimen Description ABSCESS RIGHT FOOT   Final   Special Requests ANKLE PATIENT ON FOLLOWING ZOSYN VANCOMYCIN   Final   Gram Stain     Final   Value: ABUNDANT WBC PRESENT, PREDOMINANTLY PMN     RARE SQUAMOUS EPITHELIAL CELLS PRESENT     ABUNDANT GRAM POSITIVE COCCI IN PAIRS     IN CLUSTERS     Performed at [facility]   Culture     Final   Value: NO ANAEROBES ISOLATED; CULTURE IN PROGRESS FOR 5 DAYS     Performed at [facility]   Report Status PENDING   Incomplete  MRSA PCR SCREENING     Status: None   Collection Time    06/03/13  3:35 AM      Result Value Ref Range Status   MRSA by PCR NEGATIVE  NEGATIVE Final   Comment:            The GeneXpert MRSA Assay (FDA     approved for NASAL specimens     only), is one component of a     comprehensive MRSA colonization     surveillance program. It is not     intended to diagnose MRSA     infection nor to guide or     monitor treatment for     MRSA infections.     Studies:  Recent x-ray studies have been reviewed in detail by the Attending Physician  Scheduled Meds:  Scheduled Meds: . [START ON 06/05/2013]  ceFAZolin (ANCEF) IV  2 g Intravenous On Call to OR  . chlorhexidine  60 mL Topical Once  . docusate sodium  100 mg Oral BID  . insulin aspart  0-9 Units Subcutaneous 6 times per day  . labetalol  100 mg Oral BID  . morphine      . multivitamin  1 tablet Oral QHS  . piperacillin-tazobactam (ZOSYN)  IV  2.25 g Intravenous Q8H  . sevelamer carbonate  800 mg Oral TID WC  . vancomycin  1,000 mg Intravenous Q M,W,F-HD    Time spent on care of this patient: 35 mins   Cherene Altes , MD   [facility] Office  [phone] Pager - Text Page per Shea Evans as per below:  On-Call/Text Page:      [email or web address]      password [email or web address]  If 7PM-7AM,  please contact night-coverage [email or web address] Password [email or web address] 06/04/2013, 3:30 PM   LOS: 2 days

## 2013-06-04 NOTE — Consult Note (Signed)
Reason for Consult: Abscess right ankle and right foot Referring Physician: Dr. Lorra Hals Steven Logan is an 43 y.o. male.  HPI: Patient is a 43 year old gentleman diabetic insensate neuropathy end-stage renal disease on dialysis who states she's had over a 2 week history infection in the foot and ankle. Patient states that in emergency room 3 times was admitted on this evaluation and underwent emergent irrigation and debridement by Dr. Veverly Fells. I was consulted for evaluation and treatment of the foot and ankle infection.  Past Medical History  Diagnosis Date  . Eczema   . Morbid obesity   . Gout   . HTN (hypertension)   . Dyslipidemia   . Pulmonary embolism     3  in  lungs  at  one time...  . Abscess     great toe  . Pancreatitis   . Arthritis   . Depression   . Diabetes mellitus   . Anemia   . Pneumonia     2-3 years ago  . Shortness of breath     ?? chest  cold he has now.  10/8- no SOB  . Renal hypertension     Hemo started  Sept 2014- MWF    Past Surgical History  Procedure Laterality Date  . None    . Av fistula placement Left 04/07/2012    Procedure: ARTERIOVENOUS (AV) FISTULA CREATION;  Surgeon: Angelia Mould, MD;  Location: [facility];  Service: Vascular;  Laterality: Left;  . Ligation of competing branches of arteriovenous fistula Left 09/19/2012    Procedure: LIGATION OF COMPETING BRANCHES OF LEFT ARM ARTERIOVENOUS FISTULA; Vein angioplasty;  Surgeon: Angelia Mould, MD;  Location: [facility];  Service: Vascular;  Laterality: Left;  . Insertion of dialysis catheter Right 09/19/2012    Procedure: INSERTION OF DIALYSIS CATHETER;  Surgeon: Angelia Mould, MD;  Location: [facility] OR;  Service: Vascular;  Laterality: Right;    Family History  Problem Relation Age of Onset  . Stroke Mother   . Diabetes Mother   . Hypertension Mother   . Cancer    . Coronary artery disease    . Hypertension Father   . Colon cancer Maternal Grandmother 55  . Hypertension  Brother     Social History:  reports that he has never smoked. He has never used smokeless tobacco. He reports that he does not drink alcohol or use illicit drugs.  Allergies: No Known Allergies  Medications: I have reviewed the patient's current medications.  Results for orders placed during the hospital encounter of 06/02/13 (from the past 48 hour(s))  CBC     Status: Abnormal   Collection Time    06/02/13  2:23 PM      Result Value Ref Range   WBC 15.6 (*) 4.0 - 10.5 K/uL   RBC 3.72 (*) 4.22 - 5.81 MIL/uL   Hemoglobin 9.7 (*) 13.0 - 17.0 g/dL   HCT 30.7 (*) 39.0 - 52.0 %   MCV 82.5  78.0 - 100.0 fL   MCH 26.1  26.0 - 34.0 pg   MCHC 31.6  30.0 - 36.0 g/dL   RDW 16.3 (*) 11.5 - 15.5 %   Platelets 368  150 - 400 K/uL  COMPREHENSIVE METABOLIC PANEL     Status: Abnormal   Collection Time    06/02/13  2:23 PM      Result Value Ref Range   Sodium 136 (*) 137 - 147 mEq/L   Potassium 4.5  3.7 - 5.3  mEq/L   Chloride 92 (*) 96 - 112 mEq/L   CO2 23  19 - 32 mEq/L   Glucose, Bld 260 (*) 70 - 99 mg/dL   BUN 65 (*) 6 - 23 mg/dL   Creatinine, Ser 5.70 (*) 0.50 - 1.35 mg/dL   Calcium 9.3  8.4 - 10.5 mg/dL   Total Protein 8.3  6.0 - 8.3 g/dL   Albumin 1.9 (*) 3.5 - 5.2 g/dL   AST 15  0 - 37 U/L   ALT 17  0 - 53 U/L   Alkaline Phosphatase 166 (*) 39 - 117 U/L   Total Bilirubin 0.5  0.3 - 1.2 mg/dL   GFR calc non Af Amer 11 (*) >90 mL/min   GFR calc Af Amer 13 (*) >90 mL/min   Comment: (NOTE)     The eGFR has been calculated using the CKD EPI equation.     This calculation has not been validated in all clinical situations.     eGFR's persistently <90 mL/min signify possible Chronic Kidney     Disease.  SEDIMENTATION RATE     Status: Abnormal   Collection Time    06/02/13  9:10 PM      Result Value Ref Range   Sed Rate 135 (*) 0 - 16 mm/hr  C-REACTIVE PROTEIN     Status: Abnormal   Collection Time    06/02/13  9:10 PM      Result Value Ref Range   CRP 40.5 (*) <0.60 mg/dL    Comment: (NOTE)     Result repeated and verified.     Result confirmed by automatic dilution.     Performed at [facility], ROUTINE-ABSCESS     Status: None   Collection Time    06/03/13 12:20 AM      Result Value Ref Range   Specimen Description ABSCESS RIGHT FOOT     Special Requests ANKLE PATIENT ON FOLLOWING ZOSYN VANCOMYCIN     Gram Stain       Value: ABUNDANT WBC PRESENT, PREDOMINANTLY PMN     RARE SQUAMOUS EPITHELIAL CELLS PRESENT     ABUNDANT GRAM POSITIVE COCCI IN PAIRS     IN CLUSTERS     Performed at [facility]   Culture PENDING     Report Status PENDING    ANAEROBIC CULTURE     Status: None   Collection Time    06/03/13 12:20 AM      Result Value Ref Range   Specimen Description ABSCESS RIGHT FOOT     Special Requests ANKLE PATIENT ON FOLLOWING ZOSYN VANCOMYCIN     Gram Stain       Value: ABUNDANT WBC PRESENT, PREDOMINANTLY PMN     RARE SQUAMOUS EPITHELIAL CELLS PRESENT     ABUNDANT GRAM POSITIVE COCCI IN PAIRS     IN CLUSTERS     Performed at [facility]   Culture PENDING     Report Status PENDING    GLUCOSE, CAPILLARY     Status: Abnormal   Collection Time    06/03/13  1:22 AM      Result Value Ref Range   Glucose-Capillary 238 (*) 70 - 99 mg/dL  MRSA PCR SCREENING     Status: None   Collection Time    06/03/13  3:35 AM      Result Value Ref Range   MRSA by PCR NEGATIVE  NEGATIVE   Comment:  The GeneXpert MRSA Assay (FDA     approved for NASAL specimens     only), is one component of a     comprehensive MRSA colonization     surveillance program. It is not     intended to diagnose MRSA     infection nor to guide or     monitor treatment for     MRSA infections.  GLUCOSE, CAPILLARY     Status: Abnormal   Collection Time    06/03/13  4:41 AM      Result Value Ref Range   Glucose-Capillary 239 (*) 70 - 99 mg/dL   Comment 1 Documented in Chart     Comment 2 Notify RN    HEMOGLOBIN A1C     Status:  Abnormal   Collection Time    06/03/13  7:25 AM      Result Value Ref Range   Hemoglobin A1C 7.8 (*) <5.7 %   Comment: (NOTE)                                                                               According to the ADA Clinical Practice Recommendations for 2011, when     HbA1c is used as a screening test:      >=6.5%   Diagnostic of Diabetes Mellitus               (if abnormal result is confirmed)     5.7-6.4%   Increased risk of developing Diabetes Mellitus     References:Diagnosis and Classification of Diabetes Mellitus,Diabetes     ZOXW,9604,54(UJWJX 1):S62-S69 and Standards of Medical Care in             Diabetes - 2011,Diabetes Care,2011,34 (Suppl 1):S11-S61.   Mean Plasma Glucose 177 (*) <117 mg/dL   Comment: Performed at [facility]     Status: None   Collection Time    06/03/13  7:25 AM      Result Value Ref Range   Magnesium 2.5  1.5 - 2.5 mg/dL  PHOSPHORUS     Status: Abnormal   Collection Time    06/03/13  7:25 AM      Result Value Ref Range   Phosphorus 6.7 (*) 2.3 - 4.6 mg/dL  TSH     Status: Abnormal   Collection Time    06/03/13  7:25 AM      Result Value Ref Range   TSH 0.097 (*) 0.350 - 4.500 uIU/mL   Comment: Please note change in reference range.  COMPREHENSIVE METABOLIC PANEL     Status: Abnormal   Collection Time    06/03/13  7:25 AM      Result Value Ref Range   Sodium 134 (*) 137 - 147 mEq/L   Potassium 4.6  3.7 - 5.3 mEq/L   Chloride 94 (*) 96 - 112 mEq/L   CO2 24  19 - 32 mEq/L   Glucose, Bld 211 (*) 70 - 99 mg/dL   BUN 84 (*) 6 - 23 mg/dL   Creatinine, Ser 6.69 (*) 0.50 - 1.35 mg/dL   Calcium 8.7  8.4 - 10.5 mg/dL   Total Protein 7.6  6.0 - 8.3 g/dL  Albumin 1.6 (*) 3.5 - 5.2 g/dL   AST 13  0 - 37 U/L   ALT 14  0 - 53 U/L   Alkaline Phosphatase 161 (*) 39 - 117 U/L   Total Bilirubin 0.6  0.3 - 1.2 mg/dL   GFR calc non Af Amer 9 (*) >90 mL/min   GFR calc Af Amer 11 (*) >90 mL/min   Comment: (NOTE)     The eGFR  has been calculated using the CKD EPI equation.     This calculation has not been validated in all clinical situations.     eGFR's persistently <90 mL/min signify possible Chronic Kidney     Disease.  CBC     Status: Abnormal   Collection Time    06/03/13  7:25 AM      Result Value Ref Range   WBC 14.8 (*) 4.0 - 10.5 K/uL   RBC 3.15 (*) 4.22 - 5.81 MIL/uL   Hemoglobin 8.2 (*) 13.0 - 17.0 g/dL   HCT 26.0 (*) 39.0 - 52.0 %   MCV 82.5  78.0 - 100.0 fL   MCH 26.0  26.0 - 34.0 pg   MCHC 31.5  30.0 - 36.0 g/dL   RDW 16.5 (*) 11.5 - 15.5 %   Platelets 331  150 - 400 K/uL  TROPONIN I     Status: None   Collection Time    06/03/13  7:25 AM      Result Value Ref Range   Troponin I <0.30  <0.30 ng/mL   Comment:            Due to the release kinetics of cTnI,     a negative result within the first hours     of the onset of symptoms does not rule out     myocardial infarction with certainty.     If myocardial infarction is still suspected,     repeat the test at appropriate intervals.  GLUCOSE, CAPILLARY     Status: Abnormal   Collection Time    06/03/13  7:54 AM      Result Value Ref Range   Glucose-Capillary 186 (*) 70 - 99 mg/dL  LACTIC ACID, PLASMA     Status: None   Collection Time    06/03/13  9:20 AM      Result Value Ref Range   Lactic Acid, Venous 1.5  0.5 - 2.2 mmol/L  TROPONIN I     Status: None   Collection Time    06/03/13  9:20 AM      Result Value Ref Range   Troponin I <0.30  <0.30 ng/mL   Comment:            Due to the release kinetics of cTnI,     a negative result within the first hours     of the onset of symptoms does not rule out     myocardial infarction with certainty.     If myocardial infarction is still suspected,     repeat the test at appropriate intervals.  GLUCOSE, CAPILLARY     Status: Abnormal   Collection Time    06/03/13 12:16 PM      Result Value Ref Range   Glucose-Capillary 138 (*) 70 - 99 mg/dL  TROPONIN I     Status: None    Collection Time    06/03/13  3:15 PM      Result Value Ref Range   Troponin I <0.30  <0.30 ng/mL  Comment:            Due to the release kinetics of cTnI,     a negative result within the first hours     of the onset of symptoms does not rule out     myocardial infarction with certainty.     If myocardial infarction is still suspected,     repeat the test at appropriate intervals.  GLUCOSE, CAPILLARY     Status: Abnormal   Collection Time    06/03/13  4:32 PM      Result Value Ref Range   Glucose-Capillary 167 (*) 70 - 99 mg/dL  GLUCOSE, CAPILLARY     Status: Abnormal   Collection Time    06/03/13  7:39 PM      Result Value Ref Range   Glucose-Capillary 148 (*) 70 - 99 mg/dL  GLUCOSE, CAPILLARY     Status: Abnormal   Collection Time    06/03/13 11:42 PM      Result Value Ref Range   Glucose-Capillary 173 (*) 70 - 99 mg/dL   Comment 1 Documented in Chart     Comment 2 Notify RN    GLUCOSE, CAPILLARY     Status: Abnormal   Collection Time    06/04/13  3:30 AM      Result Value Ref Range   Glucose-Capillary 139 (*) 70 - 99 mg/dL   Comment 1 Documented in Chart     Comment 2 Notify RN      Dg Chest 2 View  06/02/2013   CLINICAL DATA:  Chest pain.  EXAM: CHEST  2 VIEW  COMPARISON:  05/29/2013 and 05/04/2012 as well as chest CT 05/29/2013  FINDINGS: The lungs are hypoinflated with mild interval improvement in right base opacification likely improving effusions/atelectasis. Cannot exclude infection in the right base. Cardiomediastinal silhouette and remainder of the exam is unchanged.  IMPRESSION: Improving right base process likely effusion and atelectasis. Cannot completely exclude infection in the right base.   Electronically Signed   By: Marin Olp M.D.   On: 06/02/2013 15:19   Dg Ankle Complete Right  06/02/2013   CLINICAL DATA:  Edema, swelling, open wound lateral right ankle  EXAM: RIGHT ANKLE - COMPLETE 3+ VIEW  COMPARISON:  None.  FINDINGS: There is no acute fracture no  subluxation. The bones are osteopenic likely secondary to hypovolemia. Soft tissue swelling is again appreciated. There is no evidence of cortical destruction.  IMPRESSION: No acute osseous abnormalities.  Diffuse soft tissue swelling.   Electronically Signed   By: Margaree Mackintosh M.D.   On: 06/02/2013 16:54   Mr Ankle Right  Wo Contrast  06/02/2013   CLINICAL DATA:  Right lateral ankle wound.  EXAM: MRI OF THE RIGHT ANKLE WITHOUT CONTRAST  TECHNIQUE: Multiplanar, multisequence MR imaging of the ankle was performed. No intravenous contrast was administered.  COMPARISON:  None.  FINDINGS: There is a large fluid collection in the dorsal ankle, which is suspicious for abscess. There appear to be due tiny loculated gas bubbles within this collection. This is heterogeneous and dissects around the posterior aspect of the ankle.  Craniocaudal extent of this collection as 11.4 cm. Transverse measurement is 7.9 cm. AP thickness is difficult to measure because of the crescentic shaped conforming to the posterior aspect of the ankle but measures at least 3 cm in maximal thickness. This likely abscess infiltrates between the Achilles tendon, the posterior medial tendons and lies dorsal to the peroneal tendons. Diffuse subcutaneous edema  is present in the distal leg and ankle extending to the forefoot.  Bone marrow signal is markedly abnormal, with heterogeneous signal in the distal tibia and fibula, probably due to renal osteodystrophy. Osteochondral lesion of the medial talar dome is present with subchondral marrow edema. No fracture is present. Large ankle effusion. Minimal fluid is present in the posterior medial and posterior lateral tendon sheaths.  IMPRESSION: Large heterogeneous fluid collection in the posterior ankle, anterior to the Achilles tendon consistent with abscess. There appear to be tiny loculated gas centrally, suggesting infection with gas-forming organism.  No convincing evidence of osteomyelitis. Large  ankle effusion. Septic arthritis cannot be excluded.   Electronically Signed   By: Dereck Ligas M.D.   On: 06/02/2013 22:09    Review of Systems  All other systems reviewed and are negative.  Blood pressure 117/63, pulse 88, temperature 97.9 F (36.6 C), temperature source Oral, resp. rate 14, height 6' 1"  (1.854 m), weight 109.9 kg (242 lb 4.6 oz), SpO2 92.00%. Physical Exam On examination patient has decreased swelling there is wrinkling of the skin. He does have a good dorsalis pedis pulse with a regular heart rate. The drain was removed from the ankle the packing was removed from the Achilles tendon area. A large amount of purulent drainage draining from both the ankle and the Achilles incision. Assessment/Plan: Assessment: Persistent purulent infection right foot and ankle.  Plan: Patient is scheduled for dialysis this morning. We will plan for urgent repeat irrigation and debridement this evening. Discussed the risks of transtibial  amputation secondary to the prolonged infection. Patient states he understands and wished to proceed with surgical intervention at this time.  Newt Minion 06/04/2013, 7:01 AM

## 2013-06-04 NOTE — Transfer of Care (Signed)
Immediate Anesthesia Transfer of Care Note  Patient: Steven Logan  Procedure(s) Performed: Procedure(s): IRRIGATION AND DEBRIDEMENT RIGHT FOOT/ANKLE (Right)  Patient Location: PACU  Anesthesia Type:General  Level of Consciousness: responds to stimulation  Airway & Oxygen Therapy: Patient Spontanous Breathing and Patient connected to nasal cannula oxygen  Post-op Assessment: Report given to PACU RN and Post -op Vital signs reviewed and stable  Post vital signs: Reviewed and stable  Complications: No apparent anesthesia complications

## 2013-06-04 NOTE — Procedures (Signed)
I have seen and examined this patient and agree with the plan of care. Comfortable on dialysis Sherril Croon 06/04/2013, 9:29 AM

## 2013-06-04 NOTE — Progress Notes (Signed)
ANTICOAGULATION CONSULT NOTE - Initial Consult  Pharmacy Consult for Heparin Indication: RUE DVT  No Known Allergies  Patient Measurements: Height: 6\' 1"  (185.4 cm) Weight: 243 lb 9.7 oz (110.5 kg) IBW/kg (Calculated) : 79.9 Heparin Dosing Weight: 103 kg  Vital Signs: Temp: 98.9 F (37.2 C) (05/11 1940) Temp src: Oral (05/11 1230) BP: 152/54 mmHg (05/11 1932) Pulse Rate: 109 (05/11 1940)  Labs:  Recent Labs  06/02/13 1423 06/03/13 0725 06/03/13 0920 06/03/13 1515 06/04/13 0800  HGB 9.7* 8.2*  --   --  7.5*  HCT 30.7* 26.0*  --   --  23.9*  PLT 368 331  --   --  300  CREATININE 5.70* 6.69*  --   --  8.19*  TROPONINI  --  <0.30 <0.30 <0.30  --     Estimated Creatinine Clearance: 15.2 ml/min (by C-G formula based on Cr of 8.19).   Medical History: Past Medical History  Diagnosis Date  . Eczema   . Morbid obesity   . Gout   . HTN (hypertension)   . Dyslipidemia   . Pulmonary embolism     3  in  lungs  at  one time...  . Abscess     great toe  . Pancreatitis   . Arthritis   . Depression   . Diabetes mellitus   . Anemia   . Pneumonia     2-3 years ago  . Shortness of breath     ?? chest  cold he has now.  10/8- no SOB  . Renal hypertension     Hemo started  Sept 2014- MWF  . Chronic kidney disease     esrd    Assessment: Steven Logan noted via dopplers today to have an extensive RUE DVT. The patient underwent extensive I&D today on the R foot/ankle due to necrotizing fasciitis and is noted to still have open wounds. Received consult this evening to start heparin for RUE DVT when okayed by ortho. Discussed with Dr. Sharol Given and received okay to start heparin -- will aim for a low goal and will not bolus due to the patient's recent surgery and wounds. Will monitor s/sx of bleeding closely -- discussed with RN this evening.  Goal of Therapy:  Heparin level 0.3-0.5 units/ml Monitor platelets by anticoagulation protocol: Yes   Plan:  1. Initiate heparin at 1200  units/hr (lower start rate due to recent surgery and open wounds) 2. Daily heparin level 3. Will continue to monitor for any signs/symptoms of bleeding and will follow up with heparin level in 8 hours   Alycia Rossetti, PharmD, BCPS Clinical Pharmacist Pager: [phone] 06/04/2013 8:18 PM

## 2013-06-04 NOTE — Progress Notes (Signed)
Pt left for HD this AM at 0715 - night shift assisted in the transfer and provided report.  Pt will be returning to 2C16.  Report was called to [facility] and HD has been notified of new room.  Orthopedics rounded on the pt this AM - undressed site, pulled drain and redressed.  Significant purulent, serosanguinous drainage during dressing change per night shift.  Discussion was had with the pt that there may be a possibility that he will require amputation.  The pt is scheduled for surgery this evening at 5pm for another I&D.  Night shift obtained a consent and this will be taken to the receiving nurse at [facility].  Pt's belongings are being transferred to new room.  Pt does have items with security - receiving nurse advised.  Advised nurse that pt does need a PT consult - very dependent transfer from bed to chair - 2 to 3 person transfer. Pt is now NPO - advised receiving nurse and HD.

## 2013-06-04 NOTE — Progress Notes (Signed)
VASCULAR LAB PRELIMINARY  PRELIMINARY  PRELIMINARY  PRELIMINARY  Right upper extremity venous Doppler completed.    Preliminary report:  There is acute, non occlusive DVT noted in the right jugular, subclavian, axillary, and brachial veins.  There is superficial thrombosis noted in a branch of the basilic in the right upper extremity.  Iantha Fallen, RVT 06/04/2013, 1:45 PM

## 2013-06-05 DIAGNOSIS — G929 Unspecified toxic encephalopathy: Secondary | ICD-10-CM

## 2013-06-05 DIAGNOSIS — N186 End stage renal disease: Secondary | ICD-10-CM | POA: Diagnosis present

## 2013-06-05 DIAGNOSIS — I10 Essential (primary) hypertension: Secondary | ICD-10-CM

## 2013-06-05 DIAGNOSIS — N185 Chronic kidney disease, stage 5: Secondary | ICD-10-CM

## 2013-06-05 DIAGNOSIS — A419 Sepsis, unspecified organism: Principal | ICD-10-CM

## 2013-06-05 DIAGNOSIS — Z992 Dependence on renal dialysis: Secondary | ICD-10-CM

## 2013-06-05 DIAGNOSIS — R6521 Severe sepsis with septic shock: Secondary | ICD-10-CM

## 2013-06-05 DIAGNOSIS — G92 Toxic encephalopathy: Secondary | ICD-10-CM

## 2013-06-05 DIAGNOSIS — M009 Pyogenic arthritis, unspecified: Secondary | ICD-10-CM

## 2013-06-05 DIAGNOSIS — R6520 Severe sepsis without septic shock: Secondary | ICD-10-CM

## 2013-06-05 LAB — CULTURE, ROUTINE-ABSCESS

## 2013-06-05 LAB — GLUCOSE, CAPILLARY
GLUCOSE-CAPILLARY: 161 mg/dL — AB (ref 70–99)
GLUCOSE-CAPILLARY: 180 mg/dL — AB (ref 70–99)
Glucose-Capillary: 176 mg/dL — ABNORMAL HIGH (ref 70–99)
Glucose-Capillary: 156 mg/dL — ABNORMAL HIGH (ref 70–99)
Glucose-Capillary: 219 mg/dL — ABNORMAL HIGH (ref 70–99)
Glucose-Capillary: 242 mg/dL — ABNORMAL HIGH (ref 70–99)

## 2013-06-05 LAB — SODIUM, URINE, RANDOM: Sodium, Ur: 37 mEq/L

## 2013-06-05 LAB — CREATININE, URINE, RANDOM: Creatinine, Urine: 199.76 mg/dL

## 2013-06-05 MED ORDER — POLYETHYLENE GLYCOL 3350 17 G PO PACK
17.0000 g | PACK | Freq: Every day | ORAL | Status: DC
  Administered 2013-06-05 – 2013-06-21 (×9): 17 g via ORAL
  Filled 2013-06-05 (×17): qty 1

## 2013-06-05 MED ORDER — CHLORHEXIDINE GLUCONATE 4 % EX LIQD
60.0000 mL | Freq: Once | CUTANEOUS | Status: AC
  Administered 2013-06-06: 4 via TOPICAL
  Filled 2013-06-05: qty 15

## 2013-06-05 MED ORDER — BISACODYL 10 MG RE SUPP: 10.0000 mg | Freq: Every day | RECTAL | Status: DC | PRN

## 2013-06-05 NOTE — Progress Notes (Signed)
[facility] ROUNDING NOTE   Subjective:   Interval History: no complaints today     Sugery planned for debridement tomorrow  Objective:  Vital signs in last 24 hours:  Temp:  [97 F (36.1 C)-98.9 F (37.2 C)] 97.8 F (36.6 C) (05/12 0805) Pulse Rate:  [91-111] 91 (05/12 0805) Resp:  [10-23] 10 (05/12 0805) BP: (122-158)/(54-85) 147/68 mmHg (05/12 0805) SpO2:  [93 %-100 %] 98 % (05/12 0805) Weight:  [110 kg (242 lb 8.1 oz)-110.5 kg (243 lb 9.7 oz)] 110 kg (242 lb 8.1 oz) (05/12 0444)  Weight change:  Filed Weights   06/04/13 0749 06/04/13 1230 06/05/13 0444  Weight: 113.9 kg (251 lb 1.7 oz) 110.5 kg (243 lb 9.7 oz) 110 kg (242 lb 8.1 oz)    Intake/Output: I/O last 3 completed shifts: In: [id number] [I.V.:596; Blood:350; IV Piggyback:100] Out: 2778 [Urine:300; Other:2478]   Intake/Output this shift:  Total I/O In: 240 [P.O.:240] Out: -   CVS- RRR RS- CTA ABD- BS present soft non-distended EXT- no edema   RUE with significant edema extending from hand to shoulder; also edema RLE with bandage in place   Basic Metabolic Panel:  Recent Labs Lab 05/29/13 1616 06/02/13 1423 06/03/13 0725 06/04/13 0800  NA 139 136* 134* 134*  K 3.8 4.5 4.6 4.6  CL 96 92* 94* 91*  CO2 25 23 24 21   GLUCOSE 343* 260* 211* 141*  BUN 46* 65* 84* 102*  CREATININE 5.18* 5.70* 6.69* 8.19*  CALCIUM 8.7 9.3 8.7 8.7  MG  --   --  2.5  --   PHOS  --   --  6.7* 8.4*    Liver Function Tests:  Recent Labs Lab 05/29/13 1616 06/02/13 1423 06/03/13 0725 06/04/13 0800  AST 8 15 13   --   ALT 9 17 14   --   ALKPHOS 101 166* 161*  --   BILITOT 0.3 0.5 0.6  --   PROT 7.9 8.3 7.6  --   ALBUMIN 2.3* 1.9* 1.6* 1.5*   No results found for this basename: LIPASE, AMYLASE,  in the last 168 hours No results found for this basename: AMMONIA,  in the last 168 hours  CBC:  Recent Labs Lab 05/29/13 1616 06/02/13 1423 06/03/13 0725 06/04/13 0800  WBC 21.7* 15.6* 14.8* 17.4*   NEUTROABS 19.1*  --   --   --   HGB 9.3* 9.7* 8.2* 7.5*  HCT 29.5* 30.7* 26.0* 23.9*  MCV 84.3 82.5 82.5 81.8  PLT 404* 368 331 300    Cardiac Enzymes:  Recent Labs Lab 06/03/13 0725 06/03/13 0920 06/03/13 1515  TROPONINI <0.30 <0.30 <0.30    BNP: No components found with this basename: POCBNP,   CBG:  Recent Labs Lab 06/04/13 1833 06/04/13 2035 06/05/13 0012 06/05/13 0428 06/05/13 0803  GLUCAP 116* 125* 161* 176* 156*    Microbiology: Results for orders placed during the hospital encounter of 06/02/13  CULTURE, BLOOD (ROUTINE X 2)     Status: None   Collection Time    06/02/13 10:50 PM      Result Value Ref Range Status   Specimen Description BLOOD LEFT ARM   Final   Special Requests BOTTLES DRAWN AEROBIC AND ANAEROBIC 10CC EACH   Final   Culture  Setup Time     Final   Value: 06/03/2013 12:30     Performed at [facility]   Culture     Final   Value:  BLOOD CULTURE RECEIVED NO GROWTH TO DATE CULTURE WILL BE HELD FOR 5 DAYS BEFORE ISSUING A FINAL NEGATIVE REPORT     Performed at [facility]   Report Status PENDING   Incomplete  CULTURE, BLOOD (ROUTINE X 2)     Status: None   Collection Time    06/02/13 10:55 PM      Result Value Ref Range Status   Specimen Description BLOOD LEFT HAND   Final   Special Requests BOTTLES DRAWN AEROBIC ONLY 10CC   Final   Culture  Setup Time     Final   Value: 06/03/2013 12:30     Performed at [facility]   Culture     Final   Value:        BLOOD CULTURE RECEIVED NO GROWTH TO DATE CULTURE WILL BE HELD FOR 5 DAYS BEFORE ISSUING A FINAL NEGATIVE REPORT     Performed at [facility]   Report Status PENDING   Incomplete  CULTURE, ROUTINE-ABSCESS     Status: None   Collection Time    06/03/13 12:20 AM      Result Value Ref Range Status   Specimen Description ABSCESS RIGHT FOOT   Final   Special Requests ANKLE PATIENT ON FOLLOWING ZOSYN VANCOMYCIN   Final   Gram Stain     Final    Value: ABUNDANT WBC PRESENT, PREDOMINANTLY PMN     RARE SQUAMOUS EPITHELIAL CELLS PRESENT     ABUNDANT GRAM POSITIVE COCCI IN PAIRS     IN CLUSTERS     Performed at [facility]   Culture     Final   Value: MODERATE STAPHYLOCOCCUS AUREUS     Note: RIFAMPIN AND GENTAMICIN SHOULD NOT BE USED AS SINGLE DRUGS FOR TREATMENT OF STAPH INFECTIONS. This organism is presumed to be Clindamycin resistant based on detection of inducible Clindamycin resistance.     Performed at [facility]   Report Status 06/05/2013 FINAL   Final   Organism ID, Bacteria STAPHYLOCOCCUS AUREUS   Final  ANAEROBIC CULTURE     Status: None   Collection Time    06/03/13 12:20 AM      Result Value Ref Range Status   Specimen Description ABSCESS RIGHT FOOT   Final   Special Requests ANKLE PATIENT ON FOLLOWING ZOSYN VANCOMYCIN   Final   Gram Stain     Final   Value: ABUNDANT WBC PRESENT, PREDOMINANTLY PMN     RARE SQUAMOUS EPITHELIAL CELLS PRESENT     ABUNDANT GRAM POSITIVE COCCI IN PAIRS     IN CLUSTERS     Performed at [facility]   Culture     Final   Value: NO ANAEROBES ISOLATED; CULTURE IN PROGRESS FOR 5 DAYS     Performed at [facility]   Report Status PENDING   Incomplete  MRSA PCR SCREENING     Status: None   Collection Time    06/03/13  3:35 AM      Result Value Ref Range Status   MRSA by PCR NEGATIVE  NEGATIVE Final   Comment:            The GeneXpert MRSA Assay (FDA     approved for NASAL specimens     only), is one component of a     comprehensive MRSA colonization     surveillance program. It is not     intended to diagnose MRSA     infection nor to  guide or     monitor treatment for     MRSA infections.  CULTURE, ROUTINE-ABSCESS     Status: None   Collection Time    06/04/13  5:59 PM      Result Value Ref Range Status   Specimen Description ABSCESS ANKLE RIGHT   Final   Special Requests PATIENT ON FOLLOWING VANCOMYCIN, UNASYN   Final   Gram Stain     Final    Value: ABUNDANT WBC PRESENT,BOTH PMN AND MONONUCLEAR     NO SQUAMOUS EPITHELIAL CELLS SEEN     FEW GRAM POSITIVE COCCI     IN PAIRS IN CLUSTERS     Performed at [facility]   Culture     Final   Value: Culture reincubated for better growth     Performed at [facility]   Report Status PENDING   Incomplete  ANAEROBIC CULTURE     Status: None   Collection Time    06/04/13  5:59 PM      Result Value Ref Range Status   Specimen Description ABSCESS ANKLE RIGHT   Final   Special Requests PATIENT ON FOLLOWING VANCOMYCIN, UNASYN   Final   Gram Stain     Final   Value: ABUNDANT WBC PRESENT,BOTH PMN AND MONONUCLEAR     NO SQUAMOUS EPITHELIAL CELLS SEEN     FEW GRAM POSITIVE COCCI     IN PAIRS IN CLUSTERS     Performed at [facility]   Culture PENDING   Incomplete   Report Status PENDING   Incomplete  ANAEROBIC CULTURE     Status: None   Collection Time    06/04/13  6:07 PM      Result Value Ref Range Status   Specimen Description ABSCESS RIGHT LEG   Final   Special Requests     Final   Value: PATIENT ON FOLLOWING VANCOMYCIN UNASYN CULTURES FROM RIGHT CALF   Gram Stain     Final   Value: FEW WBC PRESENT,BOTH PMN AND MONONUCLEAR     NO SQUAMOUS EPITHELIAL CELLS SEEN     FEW GRAM POSITIVE COCCI     IN PAIRS     Performed at [facility]   Culture PENDING   Incomplete   Report Status PENDING   Incomplete  CULTURE, ROUTINE-ABSCESS     Status: None   Collection Time    06/04/13  6:07 PM      Result Value Ref Range Status   Specimen Description ABSCESS RIGHT LEG   Final   Special Requests     Final   Value: PATIENT ON FOLLOWING VANCOMYCIN UNASYN CULTURES FROM RIGHT CALF   Gram Stain     Final   Value: RARE WBC PRESENT,BOTH PMN AND MONONUCLEAR     NO SQUAMOUS EPITHELIAL CELLS SEEN     FEW GRAM POSITIVE COCCI     IN PAIRS     Performed at [facility]   Culture     Final   Value: Culture reincubated for better growth     Performed at [facility]   Report Status PENDING   Incomplete    Coagulation Studies: No results found for this basename: LABPROT, INR,  in the last 72 hours  Urinalysis: No results found for this basename: COLORURINE, APPERANCEUR, LABSPEC, PHURINE, GLUCOSEU, HGBUR, BILIRUBINUR, KETONESUR, PROTEINUR, UROBILINOGEN, NITRITE, LEUKOCYTESUR,  in the last 72 hours    Imaging: No results found.   Medications:   .  sodium chloride    . heparin 1,200 Units/hr (06/04/13 2110)   . docusate sodium  100 mg Oral BID  . insulin aspart  0-9 Units Subcutaneous 6 times per day  . labetalol  100 mg Oral BID  . multivitamin  1 tablet Oral QHS  . piperacillin-tazobactam (ZOSYN)  IV  2.25 g Intravenous Q8H  . sevelamer carbonate  800 mg Oral TID WC  . vancomycin  1,000 mg Intravenous Q M,W,F-HD   sodium chloride, sodium chloride, acetaminophen, acetaminophen, feeding supplement (NEPRO CARB STEADY), heparin, HYDROcodone-acetaminophen, HYDROmorphone (DILAUDID) injection, lidocaine (PF), lidocaine-prilocaine, metoCLOPramide (REGLAN) injection, metoCLOPramide, morphine injection, ondansetron (ZOFRAN) IV, ondansetron, oxyCODONE-acetaminophen, pentafluoroprop-tetrafluoroeth  Assessment/ Plan:   ESRD- Hemodialysis is schedule  MWF [facility]  ANEMIA- Hemoglobin 7.5  MBD-   sevelemer  HTN/VOL- controlled at present  ACCESS- AVG no issues  OTHER-  MRSA bacteremia   Cultures + ve     5/8   Treated  Vancomycin    LOS: 3 Sherril Croon @TODAY @11 :09 AM

## 2013-06-05 NOTE — Progress Notes (Signed)
[place] TEAM 1 - Stepdown/ICU TEAM Progress Note  LEORN FAIST [id number] DOB: [birth year] DOA: 06/02/2013 PCP: Tivis Ringer, MD  Admit HPI / Brief Narrative: 43 y.o. M w/ a history of Morbid obesity; Gout; HTN; Dyslipidemia; Pulmonary embolism; Pancreatitis; Arthritis; Depression; Diabetes mellitus; and chronic anemia who presented with right ankle swelling for 2 weeks.  At first it was diagnosed as gout and he was started on prednisone without improvement. Patient reported that he bumped his leg and a wound opened up and started to ooze pus. Patient subsequently presented to the ER.  An MRI of the ankle revealed gas-containing abscess and cellulitis. Orthopedics took him emergently to the OR for debridement. Pt also reported right arm swelling for 2 days.    HPI/Subjective: Pain endorsed in right leg and right arm. Fearful of possible need for femoral IV access (will it hurt to put in?)  Assessment/Plan: Severe sepsis due to septic ankle & abscess of right lower leg - Bp has improved along with tachycardia.  - cont empiric antibiotics  - S/p I and D on 06/03/2013, with second visit to the OR 06/04/2013- additional procedure planned for 5/13   - cultures revealing MSSA from wound/op site  Acute nonocclusive DVT RUE - jugular, subclavian, axillary, and brachial veins involved per prelim report - avoid access/blood draws in R arm - cont IV Heparin- convert to oral when OK per surgery -recent removal of HD vascath from right IJ 5/10 (had been in place since Aug 2014) -unable to monitor heparin levels or PTT due to poor venous acces so for now just need to monitor for bleeding and if visible bleeding occurs will need to stop Heparin for 2 hrs then resume and follow closely  Poor venous access -now has IV in right foot and has HD access left FA -for now obtain labs in HD -may need femoral IV placed in OR 5/13-Dr. Sherral Hammers to d/w Renal MD-if necessary will need to have line placed  tonight to facilitate labs  Encephalopathy, toxic - appears to be resolved  Anemia in chronic kidney disease  - per Renal  End stage renal disease  - consult Renal  Hypertension - BP stable at present   Code Status: FULL Family Communication: with family at bedside Disposition Plan: SDU  Consultants: Ortho  Procedures: I and D on 06/03/2013  Antibiotics: Vanc 5/9 > Zosyn 5/9 >  DVT prophylaxis: IV heparin   Objective: Blood pressure 147/68, pulse 91, temperature 97.8 F (36.6 C), temperature source Oral, resp. rate 10, height 6\' 1"  (1.854 m), weight 242 lb 8.1 oz (110 kg), SpO2 98.00%.  Intake/Output Summary (Last 24 hours) at 06/05/13 1017 Last data filed at 06/05/13 0600  Gross per 24 hour  Intake    946 ml  Output   2478 ml  Net  -1532 ml   Exam: General: No acute respiratory distress Lungs: Clear to auscultation bilaterally without wheezes or crackles, RA Cardiovascular: Regular rate and rhythm without murmur gallop or rub  Abdomen: Nontender, nondistended, soft, bowel sounds positive, no rebound, no ascites, no appreciable mass Extremities: No significant cyanosis, clubbing, RUE with significant edema extending from hand to shoulder; also edema RLE with bandage in place  Data Reviewed: Basic Metabolic Panel:  Recent Labs Lab 05/29/13 1616 06/02/13 1423 06/03/13 0725 06/04/13 0800  NA 139 136* 134* 134*  K 3.8 4.5 4.6 4.6  CL 96 92* 94* 91*  CO2 25 23 24 21   GLUCOSE 343* 260* 211* 141*  BUN  46* 65* 84* 102*  CREATININE 5.18* 5.70* 6.69* 8.19*  CALCIUM 8.7 9.3 8.7 8.7  MG  --   --  2.5  --   PHOS  --   --  6.7* 8.4*   Liver Function Tests:  Recent Labs Lab 05/29/13 1616 06/02/13 1423 06/03/13 0725 06/04/13 0800  AST 8 15 13   --   ALT 9 17 14   --   ALKPHOS 101 166* 161*  --   BILITOT 0.3 0.5 0.6  --   PROT 7.9 8.3 7.6  --   ALBUMIN 2.3* 1.9* 1.6* 1.5*   CBC:  Recent Labs Lab 05/29/13 1616 06/02/13 1423 06/03/13 0725  06/04/13 0800  WBC 21.7* 15.6* 14.8* 17.4*  NEUTROABS 19.1*  --   --   --   HGB 9.3* 9.7* 8.2* 7.5*  HCT 29.5* 30.7* 26.0* 23.9*  MCV 84.3 82.5 82.5 81.8  PLT 404* 368 331 300   Cardiac Enzymes:  Recent Labs Lab 06/03/13 0725 06/03/13 0920 06/03/13 1515  TROPONINI <0.30 <0.30 <0.30   CBG:  Recent Labs Lab 06/04/13 1833 06/04/13 2035 06/05/13 0012 06/05/13 0428 06/05/13 0803  GLUCAP 116* 125* 161* 176* 156*    Recent Results (from the past 240 hour(s))  CULTURE, BLOOD (ROUTINE X 2)     Status: None   Collection Time    06/02/13 10:50 PM      Result Value Ref Range Status   Specimen Description BLOOD LEFT ARM   Final   Special Requests BOTTLES DRAWN AEROBIC AND ANAEROBIC 10CC EACH   Final   Culture  Setup Time     Final   Value: 06/03/2013 12:30     Performed at [facility]   Culture     Final   Value:        BLOOD CULTURE RECEIVED NO GROWTH TO DATE CULTURE WILL BE HELD FOR 5 DAYS BEFORE ISSUING A FINAL NEGATIVE REPORT     Performed at [facility]   Report Status PENDING   Incomplete  CULTURE, BLOOD (ROUTINE X 2)     Status: None   Collection Time    06/02/13 10:55 PM      Result Value Ref Range Status   Specimen Description BLOOD LEFT HAND   Final   Special Requests BOTTLES DRAWN AEROBIC ONLY 10CC   Final   Culture  Setup Time     Final   Value: 06/03/2013 12:30     Performed at [facility]   Culture     Final   Value:        BLOOD CULTURE RECEIVED NO GROWTH TO DATE CULTURE WILL BE HELD FOR 5 DAYS BEFORE ISSUING A FINAL NEGATIVE REPORT     Performed at [facility]   Report Status PENDING   Incomplete  CULTURE, ROUTINE-ABSCESS     Status: None   Collection Time    06/03/13 12:20 AM      Result Value Ref Range Status   Specimen Description ABSCESS RIGHT FOOT   Final   Special Requests ANKLE PATIENT ON FOLLOWING ZOSYN VANCOMYCIN   Final   Gram Stain     Final   Value: ABUNDANT WBC PRESENT, PREDOMINANTLY PMN     RARE  SQUAMOUS EPITHELIAL CELLS PRESENT     ABUNDANT GRAM POSITIVE COCCI IN PAIRS     IN CLUSTERS     Performed at [facility]   Culture     Final   Value: MODERATE STAPHYLOCOCCUS AUREUS  Note: RIFAMPIN AND GENTAMICIN SHOULD NOT BE USED AS SINGLE DRUGS FOR TREATMENT OF STAPH INFECTIONS. This organism is presumed to be Clindamycin resistant based on detection of inducible Clindamycin resistance.     Performed at [facility]   Report Status 06/05/2013 FINAL   Final   Organism ID, Bacteria STAPHYLOCOCCUS AUREUS   Final  ANAEROBIC CULTURE     Status: None   Collection Time    06/03/13 12:20 AM      Result Value Ref Range Status   Specimen Description ABSCESS RIGHT FOOT   Final   Special Requests ANKLE PATIENT ON FOLLOWING ZOSYN VANCOMYCIN   Final   Gram Stain     Final   Value: ABUNDANT WBC PRESENT, PREDOMINANTLY PMN     RARE SQUAMOUS EPITHELIAL CELLS PRESENT     ABUNDANT GRAM POSITIVE COCCI IN PAIRS     IN CLUSTERS     Performed at [facility]   Culture     Final   Value: NO ANAEROBES ISOLATED; CULTURE IN PROGRESS FOR 5 DAYS     Performed at [facility]   Report Status PENDING   Incomplete  MRSA PCR SCREENING     Status: None   Collection Time    06/03/13  3:35 AM      Result Value Ref Range Status   MRSA by PCR NEGATIVE  NEGATIVE Final   Comment:            The GeneXpert MRSA Assay (FDA     approved for NASAL specimens     only), is one component of a     comprehensive MRSA colonization     surveillance program. It is not     intended to diagnose MRSA     infection nor to guide or     monitor treatment for     MRSA infections.  CULTURE, ROUTINE-ABSCESS     Status: None   Collection Time    06/04/13  5:59 PM      Result Value Ref Range Status   Specimen Description ABSCESS ANKLE RIGHT   Final   Special Requests PATIENT ON FOLLOWING VANCOMYCIN, UNASYN   Final   Gram Stain     Final   Value: ABUNDANT WBC PRESENT,BOTH PMN AND MONONUCLEAR      NO SQUAMOUS EPITHELIAL CELLS SEEN     FEW GRAM POSITIVE COCCI     IN PAIRS IN CLUSTERS     Performed at [facility]   Culture     Final   Value: Culture reincubated for better growth     Performed at [facility]   Report Status PENDING   Incomplete  ANAEROBIC CULTURE     Status: None   Collection Time    06/04/13  5:59 PM      Result Value Ref Range Status   Specimen Description ABSCESS ANKLE RIGHT   Final   Special Requests PATIENT ON FOLLOWING VANCOMYCIN, UNASYN   Final   Gram Stain     Final   Value: ABUNDANT WBC PRESENT,BOTH PMN AND MONONUCLEAR     NO SQUAMOUS EPITHELIAL CELLS SEEN     FEW GRAM POSITIVE COCCI     IN PAIRS IN CLUSTERS     Performed at [facility]   Culture PENDING   Incomplete   Report Status PENDING   Incomplete  ANAEROBIC CULTURE     Status: None   Collection Time    06/04/13  6:07 PM  Result Value Ref Range Status   Specimen Description ABSCESS RIGHT LEG   Final   Special Requests     Final   Value: PATIENT ON FOLLOWING VANCOMYCIN UNASYN CULTURES FROM RIGHT CALF   Gram Stain     Final   Value: FEW WBC PRESENT,BOTH PMN AND MONONUCLEAR     NO SQUAMOUS EPITHELIAL CELLS SEEN     FEW GRAM POSITIVE COCCI     IN PAIRS     Performed at [facility]   Culture PENDING   Incomplete   Report Status PENDING   Incomplete  CULTURE, ROUTINE-ABSCESS     Status: None   Collection Time    06/04/13  6:07 PM      Result Value Ref Range Status   Specimen Description ABSCESS RIGHT LEG   Final   Special Requests     Final   Value: PATIENT ON FOLLOWING VANCOMYCIN UNASYN CULTURES FROM RIGHT CALF   Gram Stain     Final   Value: RARE WBC PRESENT,BOTH PMN AND MONONUCLEAR     NO SQUAMOUS EPITHELIAL CELLS SEEN     FEW GRAM POSITIVE COCCI     IN PAIRS     Performed at [facility]   Culture     Final   Value: Culture reincubated for better growth     Performed at [facility]   Report Status PENDING   Incomplete      Studies:  Recent x-ray studies have been reviewed in detail by the Attending Physician  Scheduled Meds:  Scheduled Meds: . docusate sodium  100 mg Oral BID  . insulin aspart  0-9 Units Subcutaneous 6 times per day  . labetalol  100 mg Oral BID  . multivitamin  1 tablet Oral QHS  . piperacillin-tazobactam (ZOSYN)  IV  2.25 g Intravenous Q8H  . sevelamer carbonate  800 mg Oral TID WC  . vancomycin  1,000 mg Intravenous Q M,W,F-HD    Time spent on care of this patient: 35 mins   Samella Parr , ANP   [facility] Office  [phone] Pager - Text Page per Shea Evans as per below:  On-Call/Text Page:      [email or web address]      password [email or web address]  If 7PM-7AM, please contact night-coverage [email or web address] Password [email or web address] 06/05/2013, 10:17 AM   LOS: 3 days   Have examined Pt w/ ANP Ebony Hail discussed A/P and agree with plan. Discussed plan w/ Pt and answered all questions.  Greater then 40 min were spent in direct care of this Pt with MMP.

## 2013-06-05 NOTE — Progress Notes (Signed)
ANTICOAGULATION CONSULT NOTE   Pharmacy Consult for Heparin Indication: RUE DVT  No Known Allergies  Patient Measurements: Height: 6\' 1"  (185.4 cm) Weight: 242 lb 8.1 oz (110 kg) IBW/kg (Calculated) : 79.9 Heparin Dosing Weight: 103 kg  Vital Signs: Temp: 98.2 F (36.8 C) (05/12 1211) Temp src: Oral (05/12 1211) BP: 154/73 mmHg (05/12 1211) Pulse Rate: 93 (05/12 1211)  Labs:  Recent Labs  06/03/13 0725 06/03/13 0920 06/03/13 1515 06/04/13 0800  HGB 8.2*  --   --  7.5*  HCT 26.0*  --   --  23.9*  PLT 331  --   --  300  CREATININE 6.69*  --   --  8.19*  TROPONINI <0.30 <0.30 <0.30  --     Estimated Creatinine Clearance: 15.1 ml/min (by C-G formula based on Cr of 8.19).   Medical History: Past Medical History  Diagnosis Date  . Eczema   . Morbid obesity   . Gout   . HTN (hypertension)   . Dyslipidemia   . Pulmonary embolism     3  in  lungs  at  one time...  . Abscess     great toe  . Pancreatitis   . Arthritis   . Depression   . Diabetes mellitus   . Anemia   . Pneumonia     2-3 years ago  . Shortness of breath     ?? chest  cold he has now.  10/8- no SOB  . Renal hypertension     Hemo started  Sept 2014- MWF  . Chronic kidney disease     esrd    Assessment: 23 YOM noted via dopplers today to have an extensive RUE DVT. The patient underwent extensive I&D 5/11 on the R foot/ankle due to necrotizing fasciitis and is noted to still have open wounds. Received consult  to start heparin for RUE DVT when okayed by ortho. Discussed with Dr. Sharol Given and received okay to start heparin   Patient with very limited access to assess heparin levels, d/w TRH np and will continue to follow patient closely clinically and continue heparin without rate titrations. Plan is for OR tomorrow so sq lovenox in this end-stage renal patient would not be ideal. Patient to likely have IV access placed in OR tomorrow.  Goal of Therapy:  Heparin level 0.3-0.5 units/ml Monitor  platelets by anticoagulation protocol: Yes   Plan:  1. Continue heparin at 1200 units/hr (lower start rate due to recent surgery and open wounds) 2. Daily heparin level if able 3. Will continue to monitor for any signs/symptoms of bleeding   Erin Hearing PharmD., BCPS Clinical Pharmacist Pager [phone] 06/05/2013 4:01 PM

## 2013-06-05 NOTE — Evaluation (Signed)
Physical Therapy Evaluation Patient Details Name: JAHAIRE Logan MRN: [id number] DOB: [birth year] Today's Date: 06/05/2013   History of Present Illness  Pt is a 43 y/o male admitted with gas-containing abscess and cellulitis in RLE. Pt is currently NWB and another I&D is planned for 06/06/13. Per chart review there is a 50% chance of limb salvage.   Clinical Impression  Pt admitted with the above. Pt currently with functional limitations due to the deficits listed below (see PT Problem List). At the time of PT eval pt required assist for functional mobility as he was very lethargic due to medications, however was motivated to participate. Pt will benefit from skilled PT to increase their independence and safety with mobility to allow discharge to the venue listed below. If progress towards mobility goals are not made pt may benefit from post-acute rehab to increase safety and independence prior to return home.      Follow Up Recommendations Home health PT;Supervision/Assistance - 24 hour    Equipment Recommendations  Rolling walker with 5" wheels;3in1 (PT)    Recommendations for Other Services       Precautions / Restrictions Precautions Precautions: Fall Restrictions Weight Bearing Restrictions: Yes RLE Weight Bearing: Non weight bearing      Mobility  Bed Mobility Overal bed mobility: Needs Assistance Bed Mobility: Supine to Sit     Supine to sit: Min assist;HOB elevated     General bed mobility comments: VC's for sequencing and technique. Frequent cues for "big picture" goal to sit EOB as pt continuously asks what he is supposed to be doing throughout transfer.   Transfers Overall transfer level: Needs assistance Equipment used: Rolling walker (2 wheeled) Transfers: Sit to/from [facility] Sit to Stand: Mod assist Stand pivot transfers: Mod assist       General transfer comment: VC's for sequencing and safety awareness. Specific cues for NWB status  on RLE. Pt with difficulty maintaining NWB status and demonstrates some safety concerns with SPT. Pt required frequent cues to keep hands on walker and for UE support during stand>sit transfer. Pt instead began to sit before fully standing in front of chair and "plopped" down instead of a controlled descent.   Ambulation/Gait                Stairs            Wheelchair Mobility    Modified Rankin (Stroke Patients Only)       Balance Overall balance assessment: Needs assistance Sitting-balance support: Feet supported;No upper extremity supported Sitting balance-Leahy Scale: Fair     Standing balance support: Bilateral upper extremity supported Standing balance-Leahy Scale: Poor                               Pertinent Vitals/Pain Pt reports mild pain throughout mobility. Patient repositioned for comfort.     Home Living Family/patient expects to be discharged to:: Private residence Living Arrangements: Alone Available Help at Discharge: Family;Available PRN/intermittently Type of Home: Apartment Home Access: Stairs to enter   Entrance Stairs-Number of Steps: 1 Home Layout: One level Home Equipment: Crutches      Prior Function Level of Independence: Needs assistance;Independent with assistive device(s)   Gait / Transfers Assistance Needed: Using crutches     Comments: Pt states his brother was staying with him and assisting him around the house as needed.      Hand Dominance   Dominant Hand: Right  Extremity/Trunk Assessment   Upper Extremity Assessment: RUE deficits/detail RUE Deficits / Details: RUE edema noted and per chart review pt has a non-occlusive DVT present in this extremity.          Lower Extremity Assessment: RLE deficits/detail RLE Deficits / Details: Decreased strength and acute pain consistent with above mentioned diagnosis.     Cervical / Trunk Assessment: Normal  Communication   Communication: No  difficulties  Cognition Arousal/Alertness: Lethargic;Suspect due to medications Behavior During Therapy: [facility] for tasks assessed/performed Overall Cognitive Status: Within Functional Limits for tasks assessed                      General Comments      Exercises        Assessment/Plan    PT Assessment Patient needs continued PT services  PT Diagnosis Difficulty walking;Acute pain   PT Problem List Decreased strength;Decreased range of motion;Decreased activity tolerance;Decreased balance;Decreased mobility;Decreased knowledge of use of DME;Decreased safety awareness;Decreased knowledge of precautions;Pain  PT Treatment Interventions DME instruction;Gait training;Stair training;Functional mobility training;Therapeutic activities;Therapeutic exercise;Neuromuscular re-education;Patient/family education   PT Goals (Current goals can be found in the Care Plan section) Acute Rehab PT Goals Patient Stated Goal: To decrease his pain PT Goal Formulation: With patient Time For Goal Achievement: 06/12/13 Potential to Achieve Goals: Good    Frequency Min 3X/week   Barriers to discharge        Co-evaluation               End of Session Equipment Utilized During Treatment: Gait belt Activity Tolerance: Patient limited by lethargy Patient left: in chair;with call bell/phone within reach;with family/visitor present Nurse Communication: Mobility status         Time: 1005-1035 PT Time Calculation (min): 30 min   Charges:   PT Evaluation $Initial PT Evaluation Tier I: 1 Procedure PT Treatments $Therapeutic Activity: 23-37 mins   PT G CodesJolyn Lent 06/05/2013, 12:31 PM  Jolyn Lent, PT, DPT Acute Rehabilitation Services Pager: [phone]

## 2013-06-05 NOTE — Progress Notes (Signed)
Patient ID: Steven Logan, male   DOB: 23-Dec-1970, 43 y.o.   MRN: [id number] Intraoperative findings showed massive necrotizing fasciitis and purulent abscess involving the entire right leg from the calcaneus to the popliteal fossa. Plan for repeat irrigation and debridement tomorrow about 5 PM Wednesday. Intraoperative cultures were obtained x4. Discussed with the patient he has approximately a 50% chance of limb salvage.

## 2013-06-06 ENCOUNTER — Inpatient Hospital Stay (HOSPITAL_COMMUNITY): Payer: BC Managed Care – PPO | Admitting: Anesthesiology

## 2013-06-06 ENCOUNTER — Inpatient Hospital Stay (HOSPITAL_COMMUNITY): Payer: BC Managed Care – PPO

## 2013-06-06 ENCOUNTER — Encounter (HOSPITAL_COMMUNITY): Payer: BC Managed Care – PPO | Admitting: Anesthesiology

## 2013-06-06 ENCOUNTER — Encounter (HOSPITAL_COMMUNITY)
Admission: EM | Disposition: A | Discharge status: Skilled Nursing Facility | Payer: Self-pay | Source: Home / Self Care | Attending: Internal Medicine

## 2013-06-06 DIAGNOSIS — D631 Anemia in chronic kidney disease: Secondary | ICD-10-CM

## 2013-06-06 DIAGNOSIS — R609 Edema, unspecified: Secondary | ICD-10-CM

## 2013-06-06 DIAGNOSIS — Z48812 Encounter for surgical aftercare following surgery on the circulatory system: Secondary | ICD-10-CM

## 2013-06-06 DIAGNOSIS — M7989 Other specified soft tissue disorders: Secondary | ICD-10-CM

## 2013-06-06 DIAGNOSIS — N189 Chronic kidney disease, unspecified: Secondary | ICD-10-CM

## 2013-06-06 DIAGNOSIS — N039 Chronic nephritic syndrome with unspecified morphologic changes: Secondary | ICD-10-CM

## 2013-06-06 HISTORY — PX: I & D EXTREMITY: SHX5045

## 2013-06-06 LAB — CBC
HCT: 24.7 % — ABNORMAL LOW (ref 39.0–52.0)
Hemoglobin: 8 g/dL — ABNORMAL LOW (ref 13.0–17.0)
MCH: 26.8 pg (ref 26.0–34.0)
MCHC: 32.4 g/dL (ref 30.0–36.0)
MCV: 82.6 fL (ref 78.0–100.0)
Platelets: 284 10*3/uL (ref 150–400)
RBC: 2.99 MIL/uL — ABNORMAL LOW (ref 4.22–5.81)
RDW: 16.4 % — ABNORMAL HIGH (ref 11.5–15.5)
WBC: 21.6 10*3/uL — AB (ref 4.0–10.5)

## 2013-06-06 LAB — HEPARIN LEVEL (UNFRACTIONATED): Heparin Unfractionated: <0.1 IU/mL — ABNORMAL LOW (ref 0.30–0.70)

## 2013-06-06 LAB — GLUCOSE, CAPILLARY
GLUCOSE-CAPILLARY: 207 mg/dL — AB (ref 70–99)
GLUCOSE-CAPILLARY: 150 mg/dL — AB (ref 70–99)
Glucose-Capillary: 252 mg/dL — ABNORMAL HIGH (ref 70–99)
Glucose-Capillary: 142 mg/dL — ABNORMAL HIGH (ref 70–99)
Glucose-Capillary: 162 mg/dL — ABNORMAL HIGH (ref 70–99)
Glucose-Capillary: 171 mg/dL — ABNORMAL HIGH (ref 70–99)
Glucose-Capillary: 321 mg/dL — ABNORMAL HIGH (ref 70–99)

## 2013-06-06 LAB — RENAL FUNCTION PANEL
Albumin: 1.5 g/dL — ABNORMAL LOW (ref 3.5–5.2)
BUN: 86 mg/dL — ABNORMAL HIGH (ref 6–23)
CHLORIDE: 91 meq/L — AB (ref 96–112)
CO2: 23 mEq/L (ref 19–32)
Calcium: 8.5 mg/dL (ref 8.4–10.5)
Creatinine, Ser: 7.47 mg/dL — ABNORMAL HIGH (ref 0.50–1.35)
GFR calc Af Amer: 9 mL/min — ABNORMAL LOW (ref >90)
GFR calc non Af Amer: 8 mL/min — ABNORMAL LOW (ref >90)
GLUCOSE: 203 mg/dL — AB (ref 70–99)
PHOSPHORUS: 8.8 mg/dL — AB (ref 2.3–4.6)
POTASSIUM: 4.5 meq/L (ref 3.7–5.3)
SODIUM: 134 meq/L — AB (ref 137–147)

## 2013-06-06 SURGERY — IRRIGATION AND DEBRIDEMENT EXTREMITY
Anesthesia: General | Laterality: Right

## 2013-06-06 MED ORDER — ALTEPLASE 2 MG IJ SOLR
2.0000 mg | Freq: Once | INTRAMUSCULAR | Status: DC | PRN
  Filled 2013-06-06: qty 2

## 2013-06-06 MED ORDER — LIDOCAINE HCL 4 % MT SOLN
OROMUCOSAL | Status: DC | PRN
  Administered 2013-06-06: 4 mL via TOPICAL

## 2013-06-06 MED ORDER — SUCCINYLCHOLINE CHLORIDE 20 MG/ML IJ SOLN
INTRAMUSCULAR | Status: DC | PRN
  Administered 2013-06-06: 120 mg via INTRAVENOUS

## 2013-06-06 MED ORDER — HYDROMORPHONE HCL PF 1 MG/ML IJ SOLN
INTRAMUSCULAR | Status: AC
  Administered 2013-06-06: 1 mg via INTRAVENOUS
  Filled 2013-06-06: qty 1

## 2013-06-06 MED ORDER — FENTANYL CITRATE 0.05 MG/ML IJ SOLN
INTRAMUSCULAR | Status: AC
  Filled 2013-06-06: qty 5

## 2013-06-06 MED ORDER — HYDROMORPHONE HCL PF 1 MG/ML IJ SOLN
0.2500 mg | INTRAMUSCULAR | Status: DC | PRN
  Administered 2013-06-06: 0.5 mg via INTRAVENOUS

## 2013-06-06 MED ORDER — SODIUM CHLORIDE 0.9 % IV SOLN: 100.0000 mL | INTRAVENOUS | Status: DC | PRN

## 2013-06-06 MED ORDER — CHLORHEXIDINE GLUCONATE 4 % EX LIQD
CUTANEOUS | Status: AC
  Administered 2013-06-06: 1
  Filled 2013-06-06: qty 60

## 2013-06-06 MED ORDER — LIDOCAINE HCL (CARDIAC) 20 MG/ML IV SOLN
INTRAVENOUS | Status: AC
  Filled 2013-06-06: qty 5

## 2013-06-06 MED ORDER — ONDANSETRON HCL 4 MG/2ML IJ SOLN
INTRAMUSCULAR | Status: AC
  Filled 2013-06-06: qty 2

## 2013-06-06 MED ORDER — MIDAZOLAM HCL 5 MG/5ML IJ SOLN
INTRAMUSCULAR | Status: DC | PRN
  Administered 2013-06-06 (×2): 1 mg via INTRAVENOUS

## 2013-06-06 MED ORDER — MIDAZOLAM HCL 2 MG/2ML IJ SOLN
INTRAMUSCULAR | Status: AC
  Filled 2013-06-06: qty 2

## 2013-06-06 MED ORDER — PROPOFOL 10 MG/ML IV BOLUS
INTRAVENOUS | Status: DC | PRN
  Administered 2013-06-06: 160 mg via INTRAVENOUS

## 2013-06-06 MED ORDER — NEPRO/CARBSTEADY PO LIQD: 237.0000 mL | ORAL | Status: DC | PRN

## 2013-06-06 MED ORDER — METOCLOPRAMIDE HCL 5 MG/ML IJ SOLN
5.0000 mg | Freq: Three times a day (TID) | INTRAMUSCULAR | Status: DC | PRN
  Filled 2013-06-06: qty 2

## 2013-06-06 MED ORDER — LIDOCAINE-PRILOCAINE 2.5-2.5 % EX CREA: 1.0000 "application " | TOPICAL_CREAM | CUTANEOUS | Status: DC | PRN

## 2013-06-06 MED ORDER — ONDANSETRON HCL 4 MG/2ML IJ SOLN: 4.0000 mg | Freq: Four times a day (QID) | INTRAMUSCULAR | Status: DC | PRN

## 2013-06-06 MED ORDER — ARTIFICIAL TEARS OP OINT
TOPICAL_OINTMENT | OPHTHALMIC | Status: DC | PRN
  Administered 2013-06-06: 1 via OPHTHALMIC

## 2013-06-06 MED ORDER — PENTAFLUOROPROP-TETRAFLUOROETH EX AERO: 1.0000 "application " | INHALATION_SPRAY | CUTANEOUS | Status: DC | PRN

## 2013-06-06 MED ORDER — PROPOFOL 10 MG/ML IV BOLUS
INTRAVENOUS | Status: AC
  Filled 2013-06-06: qty 20

## 2013-06-06 MED ORDER — FENTANYL CITRATE 0.05 MG/ML IJ SOLN
INTRAMUSCULAR | Status: DC | PRN
  Administered 2013-06-06 (×3): 50 ug via INTRAVENOUS
  Administered 2013-06-06: 25 ug via INTRAVENOUS
  Administered 2013-06-06: 50 ug via INTRAVENOUS
  Administered 2013-06-06: 25 ug via INTRAVENOUS

## 2013-06-06 MED ORDER — HYDROMORPHONE HCL PF 1 MG/ML IJ SOLN
INTRAMUSCULAR | Status: AC
  Filled 2013-06-06: qty 2

## 2013-06-06 MED ORDER — LIDOCAINE HCL (PF) 1 % IJ SOLN: 5.0000 mL | INTRAMUSCULAR | Status: DC | PRN

## 2013-06-06 MED ORDER — METOCLOPRAMIDE HCL 5 MG PO TABS
5.0000 mg | ORAL_TABLET | Freq: Three times a day (TID) | ORAL | Status: DC | PRN
  Filled 2013-06-06: qty 2

## 2013-06-06 MED ORDER — HEPARIN SODIUM (PORCINE) 1000 UNIT/ML DIALYSIS: 1000.0000 [IU] | INTRAMUSCULAR | Status: DC | PRN

## 2013-06-06 MED ORDER — ONDANSETRON HCL 4 MG/2ML IJ SOLN
INTRAMUSCULAR | Status: DC | PRN
  Administered 2013-06-06: 4 mg via INTRAVENOUS

## 2013-06-06 MED ORDER — ONDANSETRON HCL 4 MG/2ML IJ SOLN
4.0000 mg | Freq: Once | INTRAMUSCULAR | Status: DC | PRN
Start: 2013-06-06 — End: 2013-06-06

## 2013-06-06 MED ORDER — ONDANSETRON HCL 4 MG PO TABS
4.0000 mg | ORAL_TABLET | Freq: Four times a day (QID) | ORAL | Status: DC | PRN
Start: 2013-06-06 — End: 2013-06-08

## 2013-06-06 MED ORDER — HEPARIN (PORCINE) IN NACL 100-0.45 UNIT/ML-% IJ SOLN
1950.0000 [IU]/h | INTRAMUSCULAR | Status: DC
  Administered 2013-06-06: 1450 [IU]/h via INTRAVENOUS
  Administered 2013-06-07: 1950 [IU]/h via INTRAVENOUS
  Filled 2013-06-06 (×3): qty 250

## 2013-06-06 SURGICAL SUPPLY — 42 items
BLADE SURG 10 STRL SS (BLADE) IMPLANT
BNDG COHESIVE 4X5 TAN STRL (GAUZE/BANDAGES/DRESSINGS) ×3 IMPLANT
BNDG COHESIVE 4X5 WHT NS (GAUZE/BANDAGES/DRESSINGS) ×3 IMPLANT
BNDG COHESIVE 6X5 TAN STRL LF (GAUZE/BANDAGES/DRESSINGS) IMPLANT
COVER SURGICAL LIGHT HANDLE (MISCELLANEOUS) ×3 IMPLANT
CUFF TOURNIQUET SINGLE 18IN (TOURNIQUET CUFF) IMPLANT
CUFF TOURNIQUET SINGLE 24IN (TOURNIQUET CUFF) IMPLANT
CUFF TOURNIQUET SINGLE 34IN LL (TOURNIQUET CUFF) IMPLANT
CUFF TOURNIQUET SINGLE 44IN (TOURNIQUET CUFF) IMPLANT
DRAPE U-SHAPE 47X51 STRL (DRAPES) ×3 IMPLANT
DRSG ADAPTIC 3X8 NADH LF (GAUZE/BANDAGES/DRESSINGS) ×3 IMPLANT
DURAPREP 26ML APPLICATOR (WOUND CARE) IMPLANT
ELECT CAUTERY BLADE 6.4 (BLADE) IMPLANT
ELECT REM PT RETURN 9FT ADLT (ELECTROSURGICAL) ×3
ELECTRODE REM PT RTRN 9FT ADLT (ELECTROSURGICAL) ×1 IMPLANT
GLOVE BIOGEL PI IND STRL 9 (GLOVE) ×2 IMPLANT
GLOVE BIOGEL PI INDICATOR 9 (GLOVE) ×4
GLOVE SURG ORTHO 9.0 STRL STRW (GLOVE) ×3 IMPLANT
GOWN STRL REUS W/ TWL XL LVL3 (GOWN DISPOSABLE) ×2 IMPLANT
GOWN STRL REUS W/TWL XL LVL3 (GOWN DISPOSABLE) ×4
HANDPIECE INTERPULSE COAX TIP (DISPOSABLE) ×2
KIT BASIN OR (CUSTOM PROCEDURE TRAY) ×3 IMPLANT
KIT ROOM TURNOVER OR (KITS) ×3 IMPLANT
MANIFOLD NEPTUNE II (INSTRUMENTS) ×6 IMPLANT
NS IRRIG 1000ML POUR BTL (IV SOLUTION) ×3 IMPLANT
PACK ORTHO EXTREMITY (CUSTOM PROCEDURE TRAY) ×3 IMPLANT
PAD ABD 8X10 STRL (GAUZE/BANDAGES/DRESSINGS) ×9 IMPLANT
PAD ARMBOARD 7.5X6 YLW CONV (MISCELLANEOUS) ×12 IMPLANT
PADDING CAST COTTON 6X4 STRL (CAST SUPPLIES) IMPLANT
SET HNDPC FAN SPRY TIP SCT (DISPOSABLE) ×1 IMPLANT
SPONGE GAUZE 4X4 12PLY (GAUZE/BANDAGES/DRESSINGS) ×6 IMPLANT
SPONGE GAUZE 4X4 12PLY STER LF (GAUZE/BANDAGES/DRESSINGS) ×6 IMPLANT
SPONGE LAP 18X18 X RAY DECT (DISPOSABLE) ×9 IMPLANT
STOCKINETTE IMPERVIOUS 9X36 MD (GAUZE/BANDAGES/DRESSINGS) ×3 IMPLANT
TOWEL OR 17X24 6PK STRL BLUE (TOWEL DISPOSABLE) ×3 IMPLANT
TOWEL OR 17X26 10 PK STRL BLUE (TOWEL DISPOSABLE) ×3 IMPLANT
TUBE ANAEROBIC SPECIMEN COL (MISCELLANEOUS) IMPLANT
TUBE CONNECTING 12'X1/4 (SUCTIONS) ×1
TUBE CONNECTING 12X1/4 (SUCTIONS) ×2 IMPLANT
UNDERPAD 30X30 INCONTINENT (UNDERPADS AND DIAPERS) ×3 IMPLANT
WATER STERILE IRR 1000ML POUR (IV SOLUTION) ×3 IMPLANT
YANKAUER SUCT BULB TIP NO VENT (SUCTIONS) ×3 IMPLANT

## 2013-06-06 NOTE — Transfer of Care (Signed)
Immediate Anesthesia Transfer of Care Note  Patient: Steven Logan  Procedure(s) Performed: Procedure(s): IRRIGATION AND DEBRIDEMENT EXTREMITY (Right)  Patient Location: PACU  Anesthesia Type:General  Level of Consciousness: awake and alert   Airway & Oxygen Therapy: Patient Spontanous Breathing and Patient connected to nasal cannula oxygen  Post-op Assessment: Report given to PACU RN and Post -op Vital signs reviewed and stable  Post vital signs: Reviewed and stable  Complications: No apparent anesthesia complications

## 2013-06-06 NOTE — Progress Notes (Signed)
Pharmacy: Vancomycin and Zosyn  43yom continues on day # 4 vancomycin and zosyn for massive necrotizing fasciitis and purulent abscess involving the entire right leg. He is s/p I&D 5/10, 5/11, and is scheduled for another I&D today. Also continues on dialysis MWF - has tolerated his sessions thus far.  5/10 Vancomycin>> 5/10 Zosyn>>  5/9 blood cx x2 >> pending 5/10 right foot abscess>>MSSA FINAL 5/11 anaerobic Right leg abscess>> few GPC in pairs and clusters 5/11 anaerobic right ankle abscess>> few GPC in pairs and clusters 5/11 right ankle abscess>> few GPC in pairs and clusters 5/11 right leg abscess>> few GPC in pairs  Plan: 1) Continue vancomycin 1g IV qMWF with HD 2) Continue zosyn 2.25g IV q8 3) Continue to follow cultures, LOT  Nena Jordan, PharmD, BCPS 06/06/2013, 8:54AM

## 2013-06-06 NOTE — Progress Notes (Signed)
ANTICOAGULATION CONSULT NOTE   Pharmacy Consult for Heparin Indication: RUE DVT  No Known Allergies  Patient Measurements: Height: 6\' 1"  (185.4 cm) Weight: 242 lb 8.1 oz (110 kg) IBW/kg (Calculated) : 79.9 Heparin Dosing Weight: 103 kg  Vital Signs: Temp: 98.2 F (36.8 C) (05/13 0400) Temp src: Oral (05/13 0400) BP: 147/72 mmHg (05/13 0400) Pulse Rate: 91 (05/13 0400)  Labs:  Recent Labs  06/03/13 0725 06/03/13 0920 06/03/13 1515 06/04/13 0800 06/06/13 0207  HGB 8.2*  --   --  7.5*  --   HCT 26.0*  --   --  23.9*  --   PLT 331  --   --  300  --   HEPARINUNFRC  --   --   --   --  <0.10*  CREATININE 6.69*  --   --  8.19*  --   TROPONINI <0.30 <0.30 <0.30  --   --     Estimated Creatinine Clearance: 15.1 ml/min (by C-G formula based on Cr of 8.19).   Assessment: 62 YOM noted via dopplers 5/12 to have an extensive RUE DVT. The patient underwent extensive I&D 5/11 on the R foot/ankle due to necrotizing fasciitis and is noted to still have open wounds.   Heparin level was able to be drawn ~0200 and it is undetectable on 1200 units/hr. No bleeding noted. Noted plan for OR today for debridement.  Goal of Therapy:  Heparin level 0.3-0.5 units/ml Monitor platelets by anticoagulation protocol: Yes   Plan:  1. Increase heparin to 1450 units/hr  2. Recheck 8 hour heparin level 3. Daily heparin level and CBC  Sherlon Handing, PharmD, BCPS Clinical pharmacist, pager [phone] 06/06/2013 5:04 AM

## 2013-06-06 NOTE — Anesthesia Postprocedure Evaluation (Signed)
  Anesthesia Post-op Note  Patient: Steven Logan  Procedure(s) Performed: Procedure(s): IRRIGATION AND DEBRIDEMENT EXTREMITY (Right)  Patient Location: PACU  Anesthesia Type:General  Level of Consciousness: awake, alert  and oriented  Airway and Oxygen Therapy: Patient Spontanous Breathing  Post-op Pain: none  Post-op Assessment: Post-op Vital signs reviewed  Post-op Vital Signs: Reviewed  Last Vitals:  Filed Vitals:   06/06/13 1650  BP: 108/48  Pulse: 100  Temp: 37.3 C  Resp: 16    Complications: No apparent anesthesia complications

## 2013-06-06 NOTE — Interval H&P Note (Signed)
History and Physical Interval Note:  06/06/2013 6:32 AM  Steven Logan Steven Logan  has presented today for surgery, with the diagnosis of Right Leg Infection  The various methods of treatment have been discussed with the patient and family. After consideration of risks, benefits and other options for treatment, the patient has consented to  Procedure(s): IRRIGATION AND DEBRIDEMENT EXTREMITY (Right) as a surgical intervention .  [name] history has been reviewed, patient examined, no change in status, stable for surgery.  I have reviewed the patient's chart and labs.  Questions were answered to the patient's satisfaction.     Newt Minion

## 2013-06-06 NOTE — Progress Notes (Signed)
ANTICOAGULATION CONSULT NOTE   Pharmacy Consult for Heparin Indication: RUE DVT  No Known Allergies  Patient Measurements: Height: 6\' 1"  (185.4 cm) Weight: 232 lb 5.8 oz (105.4 kg) IBW/kg (Calculated) : 79.9 Heparin Dosing Weight: 103 kg  Vital Signs: Temp: 99.1 F (37.3 C) (05/13 1650) Temp src: Oral (05/13 1650) BP: 108/48 mmHg (05/13 1650) Pulse Rate: 100 (05/13 1650)  Labs:  Recent Labs  06/04/13 0800 06/06/13 0207 06/06/13 0659 06/06/13 0707  HGB 7.5*  --  8.0*  --   HCT 23.9*  --  24.7*  --   PLT 300  --  284  --   HEPARINUNFRC  --  <0.10*  --  <0.10*  CREATININE 8.19*  --  7.47*  --     Estimated Creatinine Clearance: 16.2 ml/min (by C-G formula based on Cr of 7.47).   Assessment: 67 YOM initiated on IV heparin for extensive RUE DVT and then held for further I&D of extensive right leg wound. Orders received from Dr. Sharol Given to restart heparin now despite potential for wound to ooze and this was communicated with RN Justice Rocher).   Heparin was low this am and drip rate was changed to 1450 units/hr (~14 units/kg/hr).   Goal of Therapy:  Heparin level 0.3-0.5 units/ml Monitor platelets by anticoagulation protocol: Yes   Plan:  1. Restart heparin at 1450 units/hr (NO BOLUS) 2. Recheck 8 hour heparin level 3. Daily heparin level and CBC 4. Monitor for signs and symptoms of bleeding.   Sloan Leiter, PharmD, BCPS Clinical Pharmacist [phone] 06/06/2013 5:24 PM

## 2013-06-06 NOTE — H&P (View-Only) (Signed)
Patient ID: Steven Logan, male   DOB: [birth year], 43 y.o.   MRN: [id number] Intraoperative findings showed massive necrotizing fasciitis and purulent abscess involving the entire right leg from the calcaneus to the popliteal fossa. Plan for repeat irrigation and debridement tomorrow about 5 PM Wednesday. Intraoperative cultures were obtained x4. Discussed with the patient he has approximately a 50% chance of limb salvage.

## 2013-06-06 NOTE — Anesthesia Preprocedure Evaluation (Addendum)
Anesthesia Evaluation  Patient identified by MRN, date of birth, ID band Patient awake    Reviewed: Allergy & Precautions, H&P , NPO status , reviewed documented beta blocker date and time   Airway Mallampati: II TM Distance: >3 FB Neck ROM: Full    Dental  (+) Teeth Intact, Dental Advisory Given   Pulmonary shortness of breath, PE breath sounds clear to auscultation        Cardiovascular hypertension, Pt. on home beta blockers DVT Rhythm:Regular Rate:Normal     Neuro/Psych PSYCHIATRIC DISORDERS Depression    GI/Hepatic   Endo/Other  diabetes, Type 2, Insulin DependentMorbid obesity  Renal/GU ESRF and DialysisRenal diseaseLast HD today per patient. Left arm AVF     Musculoskeletal   Abdominal   Peds  Hematology  (+) anemia ,   Anesthesia Other Findings   Reproductive/Obstetrics                         Anesthesia Physical Anesthesia Plan  ASA: III  Anesthesia Plan: General   Post-op Pain Management:    Induction: Intravenous  Airway Management Planned: Oral ETT  Additional Equipment: Ultrasound Guidance Line Placement and CVP  Intra-op Plan:   Post-operative Plan: Extubation in OR  Informed Consent: I have reviewed the patients History and Physical, chart, labs and discussed the procedure including the risks, benefits and alternatives for the proposed anesthesia with the patient or authorized representative who has indicated his/her understanding and acceptance.   Dental advisory given  Plan Discussed with: CRNA and Anesthesiologist  Anesthesia Plan Comments: (necrotizing fascitiis R LE Type 2 DM glucose 150 ESRD had HD today prior to surgery K-4.5 before HD Hypertension  Gout  R. Arm DVT Poor venous access.  Roberts Gaudy)       Anesthesia Quick Evaluation

## 2013-06-06 NOTE — Procedures (Signed)
I have seen and examined this patient and agree with the plan of care . No complaints  BP 155/67 goal 4 L   Sherril Croon 06/06/2013, 9:04 AM

## 2013-06-06 NOTE — Op Note (Signed)
OPERATIVE REPORT  DATE OF SURGERY: 06/06/2013  PATIENT:  Steven Logan,  43 y.o. male  PRE-OPERATIVE DIAGNOSIS:  Necrotizing fasciitis right leg  POST-OPERATIVE DIAGNOSIS:  same  PROCEDURE:  Procedure(s): IRRIGATION AND DEBRIDEMENT EXTREMITY extensive excision of muscle fascia and soft tissue right leg  SURGEON:  Surgeon(s): Newt Minion, MD  ANESTHESIA:   general  EBL:  Minimal ML  SPECIMEN:  No Specimen  TOURNIQUET:  * No tourniquets in log *  PROCEDURE DETAILS: Patient is a 42 year old gentleman who presents in followup for the necrotizing fasciitis to the right leg. Patient presents for repeat excisional debridement. Risks and benefits were discussed including potential for above-the-knee amputation. Patient states he understands and wished to proceed at this time. Description of procedure patient was brought to the operating room and underwent a general anesthetic. After adequate levels of anesthesia were obtained patient was placed prone all bony prominences were padded and the right lower extremity was prepped using Betadine paint and draped into a sterile field. The end stage and was extended from the calcaneus up to the popliteal fossa. There was still a small amount of purulence distally around the calcaneus there is no purulence proximally posterior to the popliteal fossa. Patient had more necrotic muscle and patient with extends underwent extensive debridement with excision of the muscles in the deep and superficial posterior compartment. Patient essentially had complete excision of the gastrocnemius soleus complex as well as the deep flexor tendons including the FHL. The wound was irrigated with pulsatile lavage. Hemostasis was obtained. There is no visible necrotic tissue. The wound was packed open with a sterile dressing. Patient was extubated taken to the PACU in stable condition plan for repeat surgery next week most likely some type of amputation.  PLAN OF CARE: Admit  to inpatient   PATIENT DISPOSITION:  PACU - hemodynamically stable.   Newt Minion, MD 06/06/2013 3:24 PM

## 2013-06-06 NOTE — Progress Notes (Signed)
[place] TEAM 1 - Stepdown/ICU TEAM Progress Note  Steven Logan [id number] DOB: 1970-11-04 DOA: 06/02/2013 PCP: Tivis Ringer, MD  Admit HPI / Brief Narrative: 43 y.o. M w/ a history of Morbid obesity; Gout; HTN; Dyslipidemia; Pulmonary embolism; Pancreatitis; Arthritis; Depression; Diabetes mellitus; and chronic anemia who presented with right ankle swelling for 2 weeks.  At first it was diagnosed as gout and he was started on prednisone without improvement. Patient reported that he bumped his leg and a wound opened up and started to ooze pus. Patient subsequently presented to the ER.  An MRI of the ankle revealed gas-containing abscess and cellulitis. Orthopedics took him emergently to the OR for debridement. Pt also reported right arm swelling for 2 days.    HPI/Subjective: Still with pain RUE and RLE. Hungry.  Assessment/Plan: Severe sepsis due to septic ankle & abscess of right lower leg - Bp has improved but still a little tachycardic post HD today  - cont empiric antibiotics  - S/p I and D on 06/03/2013, with second visit to the OR 06/04/2013- additional procedure planned for 5/13   - cultures revealing MSSA from wound/op site  Acute nonocclusive DVT RUE - jugular, subclavian, axillary, and brachial veins involved per prelim report - avoid access/blood draws in R arm - cont IV Heparin- convert to oral when OK per surgery -recent removal of HD vascath from right IJ 5/10 (had been in place since Aug 2014) so coagulopathy work up not indicated -unable to monitor heparin levels or PTT due to poor venous acces so for now just need to monitor for bleeding and if visible bleeding occurs will need to stop Heparin for 2 hrs then resume and follow closely  Poor venous access -now has IV in right foot and has HD access left FA -for now obtain labs in HD -Dr. Sherral Hammers d/w Dr Linna Caprice of anesthesia re: need for CL access; per our prior discussions with nephro and our exam wish to avoid  jugular access due to acute DVT on right and HD access on left- has infection RLE so recommend place left femoral CVL  Encephalopathy, toxic - appears to be resolved  Anemia in chronic kidney disease  - per Renal  End stage renal disease  - consult Renal  Hypertension - BP stable at present   Code Status: FULL Family Communication: with family at bedside Disposition Plan: SDU  Consultants: Ortho  Procedures: I and D on 06/03/2013  Antibiotics: Vanc 5/9 > Zosyn 5/9 >  DVT prophylaxis: IV heparin   Objective: Blood pressure 112/63, pulse 110, temperature 98.8 F (37.1 C), temperature source Oral, resp. rate 16, height 6\' 1"  (1.854 m), weight 232 lb 5.8 oz (105.4 kg), SpO2 97.00%.  Intake/Output Summary (Last 24 hours) at 06/06/13 1333 Last data filed at 06/06/13 1117  Gross per 24 hour  Intake 869.71 ml  Output   4200 ml  Net -3330.29 ml   Exam: General: No acute respiratory distress Lungs: Clear to auscultation bilaterally without wheezes or crackles, RA Cardiovascular: Regular rate and rhythm without murmur gallop or rub  Abdomen: Nontender, nondistended, soft, bowel sounds positive, no rebound, no ascites, no appreciable mass Extremities: No significant cyanosis, clubbing, RUE with significant edema extending from hand to shoulder; also edema RLE with bandage in place  Data Reviewed: Basic Metabolic Panel:  Recent Labs Lab 06/02/13 1423 06/03/13 0725 06/04/13 0800 06/06/13 0659  NA 136* 134* 134* 134*  K 4.5 4.6 4.6 4.5  CL 92* 94* 91* 91*  CO2 23 24 21 23   GLUCOSE 260* 211* 141* 203*  BUN 65* 84* 102* 86*  CREATININE 5.70* 6.69* 8.19* 7.47*  CALCIUM 9.3 8.7 8.7 8.5  MG  --  2.5  --   --   PHOS  --  6.7* 8.4* 8.8*   Liver Function Tests:  Recent Labs Lab 06/02/13 1423 06/03/13 0725 06/04/13 0800 06/06/13 0659  AST 15 13  --   --   ALT 17 14  --   --   ALKPHOS 166* 161*  --   --   BILITOT 0.5 0.6  --   --   PROT 8.3 7.6  --   --     ALBUMIN 1.9* 1.6* 1.5* 1.5*   CBC:  Recent Labs Lab 06/02/13 1423 06/03/13 0725 06/04/13 0800 06/06/13 0659  WBC 15.6* 14.8* 17.4* 21.6*  HGB 9.7* 8.2* 7.5* 8.0*  HCT 30.7* 26.0* 23.9* 24.7*  MCV 82.5 82.5 81.8 82.6  PLT 368 331 300 284   Cardiac Enzymes:  Recent Labs Lab 06/03/13 0725 06/03/13 0920 06/03/13 1515  TROPONINI <0.30 <0.30 <0.30   CBG:  Recent Labs Lab 06/05/13 1632 06/05/13 2020 06/06/13 0035 06/06/13 0443 06/06/13 1150  GLUCAP 219* 242* 252* 207* 142*    Recent Results (from the past 240 hour(s))  CULTURE, BLOOD (ROUTINE X 2)     Status: None   Collection Time    06/02/13 10:50 PM      Result Value Ref Range Status   Specimen Description BLOOD LEFT ARM   Final   Special Requests BOTTLES DRAWN AEROBIC AND ANAEROBIC 10CC EACH   Final   Culture  Setup Time     Final   Value: 06/03/2013 12:30     Performed at [facility]   Culture     Final   Value:        BLOOD CULTURE RECEIVED NO GROWTH TO DATE CULTURE WILL BE HELD FOR 5 DAYS BEFORE ISSUING A FINAL NEGATIVE REPORT     Performed at [facility]   Report Status PENDING   Incomplete  CULTURE, BLOOD (ROUTINE X 2)     Status: None   Collection Time    06/02/13 10:55 PM      Result Value Ref Range Status   Specimen Description BLOOD LEFT HAND   Final   Special Requests BOTTLES DRAWN AEROBIC ONLY 10CC   Final   Culture  Setup Time     Final   Value: 06/03/2013 12:30     Performed at [facility]   Culture     Final   Value:        BLOOD CULTURE RECEIVED NO GROWTH TO DATE CULTURE WILL BE HELD FOR 5 DAYS BEFORE ISSUING A FINAL NEGATIVE REPORT     Performed at [facility]   Report Status PENDING   Incomplete  CULTURE, ROUTINE-ABSCESS     Status: None   Collection Time    06/03/13 12:20 AM      Result Value Ref Range Status   Specimen Description ABSCESS RIGHT FOOT   Final   Special Requests ANKLE PATIENT ON FOLLOWING ZOSYN VANCOMYCIN   Final   Gram Stain      Final   Value: ABUNDANT WBC PRESENT, PREDOMINANTLY PMN     RARE SQUAMOUS EPITHELIAL CELLS PRESENT     ABUNDANT GRAM POSITIVE COCCI IN PAIRS     IN CLUSTERS     Performed at [facility]  Final   Value: MODERATE STAPHYLOCOCCUS AUREUS     Note: RIFAMPIN AND GENTAMICIN SHOULD NOT BE USED AS SINGLE DRUGS FOR TREATMENT OF STAPH INFECTIONS. This organism is presumed to be Clindamycin resistant based on detection of inducible Clindamycin resistance.     Performed at [facility]   Report Status 06/05/2013 FINAL   Final   Organism ID, Bacteria STAPHYLOCOCCUS AUREUS   Final  ANAEROBIC CULTURE     Status: None   Collection Time    06/03/13 12:20 AM      Result Value Ref Range Status   Specimen Description ABSCESS RIGHT FOOT   Final   Special Requests ANKLE PATIENT ON FOLLOWING ZOSYN VANCOMYCIN   Final   Gram Stain     Final   Value: ABUNDANT WBC PRESENT, PREDOMINANTLY PMN     RARE SQUAMOUS EPITHELIAL CELLS PRESENT     ABUNDANT GRAM POSITIVE COCCI IN PAIRS     IN CLUSTERS     Performed at [facility]   Culture     Final   Value: NO ANAEROBES ISOLATED; CULTURE IN PROGRESS FOR 5 DAYS     Performed at [facility]   Report Status PENDING   Incomplete  MRSA PCR SCREENING     Status: None   Collection Time    06/03/13  3:35 AM      Result Value Ref Range Status   MRSA by PCR NEGATIVE  NEGATIVE Final   Comment:            The GeneXpert MRSA Assay (FDA     approved for NASAL specimens     only), is one component of a     comprehensive MRSA colonization     surveillance program. It is not     intended to diagnose MRSA     infection nor to guide or     monitor treatment for     MRSA infections.  CULTURE, ROUTINE-ABSCESS     Status: None   Collection Time    06/04/13  5:59 PM      Result Value Ref Range Status   Specimen Description ABSCESS ANKLE RIGHT   Final   Special Requests PATIENT ON FOLLOWING VANCOMYCIN, UNASYN   Final   Gram Stain      Final   Value: ABUNDANT WBC PRESENT,BOTH PMN AND MONONUCLEAR     NO SQUAMOUS EPITHELIAL CELLS SEEN     FEW GRAM POSITIVE COCCI     IN PAIRS IN CLUSTERS     Performed at [facility]   Culture     Final   Value: MODERATE STAPHYLOCOCCUS AUREUS     Note: RIFAMPIN AND GENTAMICIN SHOULD NOT BE USED AS SINGLE DRUGS FOR TREATMENT OF STAPH INFECTIONS.     Performed at [facility]   Report Status PENDING   Incomplete  ANAEROBIC CULTURE     Status: None   Collection Time    06/04/13  5:59 PM      Result Value Ref Range Status   Specimen Description ABSCESS ANKLE RIGHT   Final   Special Requests PATIENT ON FOLLOWING VANCOMYCIN, UNASYN   Final   Gram Stain     Final   Value: ABUNDANT WBC PRESENT,BOTH PMN AND MONONUCLEAR     NO SQUAMOUS EPITHELIAL CELLS SEEN     FEW GRAM POSITIVE COCCI     IN PAIRS IN CLUSTERS     Performed at [facility]  Final   Value: NO ANAEROBES ISOLATED; CULTURE IN PROGRESS FOR 5 DAYS     Performed at [facility]   Report Status PENDING   Incomplete  ANAEROBIC CULTURE     Status: None   Collection Time    06/04/13  6:07 PM      Result Value Ref Range Status   Specimen Description ABSCESS RIGHT LEG   Final   Special Requests     Final   Value: PATIENT ON FOLLOWING VANCOMYCIN UNASYN CULTURES FROM RIGHT CALF   Gram Stain     Final   Value: FEW WBC PRESENT,BOTH PMN AND MONONUCLEAR     NO SQUAMOUS EPITHELIAL CELLS SEEN     FEW GRAM POSITIVE COCCI     IN PAIRS     Performed at [facility]   Culture     Final   Value: NO ANAEROBES ISOLATED; CULTURE IN PROGRESS FOR 5 DAYS     Performed at [facility]   Report Status PENDING   Incomplete  CULTURE, ROUTINE-ABSCESS     Status: None   Collection Time    06/04/13  6:07 PM      Result Value Ref Range Status   Specimen Description ABSCESS RIGHT LEG   Final   Special Requests     Final   Value: PATIENT ON FOLLOWING VANCOMYCIN UNASYN CULTURES FROM RIGHT CALF     Gram Stain     Final   Value: RARE WBC PRESENT,BOTH PMN AND MONONUCLEAR     NO SQUAMOUS EPITHELIAL CELLS SEEN     FEW GRAM POSITIVE COCCI     IN PAIRS     Performed at [facility]   Culture     Final   Value: MODERATE STAPHYLOCOCCUS AUREUS     Performed at [facility]   Report Status PENDING   Incomplete     Studies:  Recent x-ray studies have been reviewed in detail by the Attending Physician  Scheduled Meds:  Scheduled Meds: . [facility] HOLD] docusate sodium  100 mg Oral BID  . [[facility] HOLD] insulin aspart  0-9 Units Subcutaneous 6 times per day  . [facility] HOLD] labetalol  100 mg Oral BID  . [[facility] HOLD] multivitamin  1 tablet Oral QHS  . [[facility] HOLD] piperacillin-tazobactam (ZOSYN)  IV  2.25 g Intravenous Q8H  . [[facility] HOLD] polyethylene glycol  17 g Oral Daily  . [facility] HOLD] sevelamer carbonate  800 mg Oral TID WC  . [facility] HOLD] vancomycin  1,000 mg Intravenous Q M,W,F-HD    Time spent on care of this patient: 35 mins   Samella Parr , ANP   [facility] Office  [phone] Pager - Text Page per Shea Evans as per below:  On-Call/Text Page:      [email or web address]      password [email or web address]  If 7PM-7AM, please contact night-coverage [email or web address] Password [email or web address] 06/06/2013, 1:33 PM   LOS: 4 days   Examining patient with ANP Ebony Hail and discussed assessment and plan of care. Agree with plan of care. Discuss plan of care with patient and answered all questions. Patient care greater than 35 minute

## 2013-06-07 DIAGNOSIS — I2699 Other pulmonary embolism without acute cor pulmonale: Secondary | ICD-10-CM

## 2013-06-07 DIAGNOSIS — R7309 Other abnormal glucose: Secondary | ICD-10-CM

## 2013-06-07 DIAGNOSIS — I82629 Acute embolism and thrombosis of deep veins of unspecified upper extremity: Secondary | ICD-10-CM | POA: Diagnosis present

## 2013-06-07 DIAGNOSIS — I82409 Acute embolism and thrombosis of unspecified deep veins of unspecified lower extremity: Secondary | ICD-10-CM

## 2013-06-07 DIAGNOSIS — R739 Hyperglycemia, unspecified: Secondary | ICD-10-CM | POA: Diagnosis present

## 2013-06-07 LAB — TROPONIN I

## 2013-06-07 LAB — CULTURE, ROUTINE-ABSCESS

## 2013-06-07 LAB — CBC
HCT: 21.5 % — ABNORMAL LOW (ref 39.0–52.0)
HEMATOCRIT: 19.9 % — AB (ref 39.0–52.0)
HEMOGLOBIN: 6.3 g/dL — AB (ref 13.0–17.0)
HEMOGLOBIN: 7.1 g/dL — AB (ref 13.0–17.0)
MCH: 26.8 pg (ref 26.0–34.0)
MCH: 27.1 pg (ref 26.0–34.0)
MCHC: 31.7 g/dL (ref 30.0–36.0)
MCHC: 33 g/dL (ref 30.0–36.0)
MCV: 84.7 fL (ref 78.0–100.0)
MCV: 82.1 fL (ref 78.0–100.0)
PLATELETS: 334 10*3/uL (ref 150–400)
Platelets: 330 10*3/uL (ref 150–400)
RBC: 2.35 MIL/uL — AB (ref 4.22–5.81)
RBC: 2.62 MIL/uL — ABNORMAL LOW (ref 4.22–5.81)
RDW: 16.7 % — ABNORMAL HIGH (ref 11.5–15.5)
RDW: 16.1 % — ABNORMAL HIGH (ref 11.5–15.5)
WBC: 23.6 10*3/uL — ABNORMAL HIGH (ref 4.0–10.5)
WBC: 26.5 10*3/uL — ABNORMAL HIGH (ref 4.0–10.5)

## 2013-06-07 LAB — GLUCOSE, CAPILLARY
GLUCOSE-CAPILLARY: 202 mg/dL — AB (ref 70–99)
GLUCOSE-CAPILLARY: 284 mg/dL — AB (ref 70–99)
GLUCOSE-CAPILLARY: 292 mg/dL — AB (ref 70–99)
Glucose-Capillary: 165 mg/dL — ABNORMAL HIGH (ref 70–99)
Glucose-Capillary: 261 mg/dL — ABNORMAL HIGH (ref 70–99)
Glucose-Capillary: 129 mg/dL — ABNORMAL HIGH (ref 70–99)

## 2013-06-07 LAB — HEPARIN LEVEL (UNFRACTIONATED)
HEPARIN UNFRACTIONATED: 0.15 [IU]/mL — AB (ref 0.30–0.70)
HEPARIN UNFRACTIONATED: 0.14 [IU]/mL — AB (ref 0.30–0.70)
Heparin Unfractionated: <0.1 IU/mL — ABNORMAL LOW (ref 0.30–0.70)

## 2013-06-07 LAB — PREPARE RBC (CROSSMATCH)

## 2013-06-07 MED ORDER — LABETALOL HCL 200 MG PO TABS
200.0000 mg | ORAL_TABLET | Freq: Two times a day (BID) | ORAL | Status: DC
  Administered 2013-06-07 – 2013-06-11 (×6): 200 mg via ORAL
  Filled 2013-06-07 (×11): qty 1

## 2013-06-07 MED ORDER — INSULIN ASPART 100 UNIT/ML ~~LOC~~ SOLN
0.0000 [IU] | SUBCUTANEOUS | Status: DC
  Administered 2013-06-07: 11 [IU] via SUBCUTANEOUS
  Administered 2013-06-07 – 2013-06-08 (×3): 3 [IU] via SUBCUTANEOUS
  Administered 2013-06-08 (×2): 4 [IU] via SUBCUTANEOUS

## 2013-06-07 MED ORDER — INSULIN GLARGINE 100 UNIT/ML ~~LOC~~ SOLN
5.0000 [IU] | Freq: Every day | SUBCUTANEOUS | Status: DC
  Administered 2013-06-07 – 2013-06-08 (×2): 5 [IU] via SUBCUTANEOUS
  Filled 2013-06-07 (×3): qty 0.05

## 2013-06-07 MED ORDER — HEPARIN (PORCINE) IN NACL 100-0.45 UNIT/ML-% IJ SOLN
2550.0000 [IU]/h | INTRAMUSCULAR | Status: DC
  Administered 2013-06-08 – 2013-06-09 (×2): 2400 [IU]/h via INTRAVENOUS
  Administered 2013-06-10: 2550 [IU]/h via INTRAVENOUS
  Filled 2013-06-07 (×7): qty 250

## 2013-06-07 MED ORDER — LABETALOL HCL 100 MG PO TABS
100.0000 mg | ORAL_TABLET | Freq: Once | ORAL | Status: AC
  Administered 2013-06-07: 100 mg via ORAL
  Filled 2013-06-07: qty 1

## 2013-06-07 MED ORDER — GLUCERNA SHAKE PO LIQD
237.0000 mL | Freq: Three times a day (TID) | ORAL | Status: DC
  Administered 2013-06-07 – 2013-06-20 (×19): 237 mL via ORAL
  Filled 2013-06-07: qty 237

## 2013-06-07 NOTE — Progress Notes (Signed)
Patient ID: Steven Logan, male   DOB: 1970-08-14, 43 y.o.   MRN: [id number] Postoperative day 1 repeat debridement right leg. Patient had increased necrotic muscle in the right calf. This was debrided. Will start wet to dry dressing changes. Anticipates return to surgery early next week. Patient may require an amputation depending on the viability of the muscle. There is no purulence at this time.

## 2013-06-07 NOTE — Progress Notes (Signed)
CRITICAL VALUE ALERT  Critical value received:  Hemoglobin 6.3  Date of notification:  06/07/2013  Time of notification:  0605  Critical value read back:yes  Nurse who received alert:  Annice Pih  MD notified (1st page):  Paged [facility]y, NP  Time of first page:  0607  MD notified (2nd page):  Time of second page:  Responding MD:  Time MD responded:

## 2013-06-07 NOTE — Progress Notes (Signed)
[place] TEAM 1 - Stepdown/ICU TEAM Progress Note  Steven Logan [id number] DOB: January 12, 1971 DOA: 06/02/2013 PCP: Tivis Ringer, MD  Admit HPI / Brief Narrative: 43 y.o. M w/ a history of Morbid obesity; Gout; HTN; Dyslipidemia; Pulmonary embolism; Pancreatitis; Arthritis; Depression; Diabetes mellitus; and chronic anemia who presented with right ankle swelling for 2 weeks.  At first it was diagnosed as gout and he was started on prednisone without improvement. Patient reported that he bumped his leg and a wound opened up and started to ooze pus. Patient subsequently presented to the ER.  An MRI of the ankle revealed gas-containing abscess and cellulitis. Orthopedics took him emergently to the OR for debridement. Pt also reported right arm swelling for 2 days.    HPI/Subjective: Much more alert today. Although still having pain in right upper extremity is much less able to move or more easily. Still significant pain and right lower extremity  Assessment/Plan: Severe sepsis due to septic ankle & abscess of right lower leg - BP has improved but still a little tachycardic post HD today  - cont empiric antibiotics  - S/p I and D on 06/03/2013, with second visit to the OR 06/04/2013- additional procedure planned for 5/13   - cultures revealing MSSA from wound/op site  Acute nonocclusive DVT RUE - jugular, subclavian, axillary, and brachial veins involved per prelim report - avoid access/blood draws in R arm - cont IV Heparin- convert to oral when OK per surgery -recent removal of HD vascath from right IJ 5/10 (had been in place since Aug 2014) so coagulopathy work up not indicated  Poor venous access -now has IV in right foot and has HD access left FA -for now obtain labs in HD -Dr. Sherral Hammers d/w Dr Linna Caprice of anesthesia re: need for CL access; per our prior discussions with nephro and our exam wish to avoid jugular access due to acute DVT on right and HD access on left - has infection RLE so  recommend place left femoral CVL -postop note patient has central line left IJ  Encephalopathy, toxic - resolved  Anemia in chronic kidney disease/acute postoperative blood loss anemia  - per Renal regarding chronic component -Postoperatively hemoglobin has dropped to 6.9 so we'll transfuse 1 unit and repeat CBC 2 hours after transfusion complete  End stage renal disease on HD M/W/F - consult Renal  Hypertension - BP stable at present -Saenz influenced by ongoing issues with pain -5/14 increase labetalol to 200 mg BID  Hyperglycemia -Start Lantus 5 units each bedtime -Continue resistant SSI -Obtain hemoglobin A1c    Code Status: FULL Family Communication: with family at bedside Disposition Plan: SDU-PT/OT as recommended by orthopedic physician-if remains hemodynamically stable consider transferring to telemetry bed in a.m.  Consultants: Ortho  Procedures: I and D on 06/03/2013  IRRIGATION AND DEBRIDEMENT EXTREMITY extensive excision of muscle fascia and soft tissue right leg 06/06/13  Antibiotics: Vanc 5/9 > Zosyn 5/9 >  DVT prophylaxis: IV heparin   Objective: Blood pressure 135/64, pulse 114, temperature 99.9 F (37.7 C), temperature source Oral, resp. rate 24, height 6\' 1"  (1.854 m), weight 106 kg (233 lb 11 oz), SpO2 99.00%.  Intake/Output Summary (Last 24 hours) at 06/07/13 1936 Last data filed at 06/07/13 1502  Gross per 24 hour  Intake  847.5 ml  Output      0 ml  Net  847.5 ml   Exam: General: No acute respiratory distress Lungs: Clear to auscultation bilaterally without wheezes or crackles, RA Cardiovascular:  Regular rate and rhythm without murmur gallop or rub  Abdomen: Nontender, nondistended, soft, bowel sounds positive, no rebound, no ascites, no appreciable mass Extremities: No significant cyanosis, clubbing, RUE with significant edema extending from hand to shoulder; also edema RLE with bandage in place  Data Reviewed: Basic Metabolic  Panel:  Recent Labs Lab 06/02/13 1423 06/03/13 0725 06/04/13 0800 06/06/13 0659  NA 136* 134* 134* 134*  K 4.5 4.6 4.6 4.5  CL 92* 94* 91* 91*  CO2 23 24 21 23   GLUCOSE 260* 211* 141* 203*  BUN 65* 84* 102* 86*  CREATININE 5.70* 6.69* 8.19* 7.47*  CALCIUM 9.3 8.7 8.7 8.5  MG  --  2.5  --   --   PHOS  --  6.7* 8.4* 8.8*   Liver Function Tests:  Recent Labs Lab 06/02/13 1423 06/03/13 0725 06/04/13 0800 06/06/13 0659  AST 15 13  --   --   ALT 17 14  --   --   ALKPHOS 166* 161*  --   --   BILITOT 0.5 0.6  --   --   PROT 8.3 7.6  --   --   ALBUMIN 1.9* 1.6* 1.5* 1.5*   CBC:  Recent Labs Lab 06/03/13 0725 06/04/13 0800 06/06/13 0659 06/07/13 0500 06/07/13 1623  WBC 14.8* 17.4* 21.6* 23.6* 26.5*  HGB 8.2* 7.5* 8.0* 6.3* 7.1*  HCT 26.0* 23.9* 24.7* 19.9* 21.5*  MCV 82.5 81.8 82.6 84.7 82.1  PLT 331 300 284 330 334   Cardiac Enzymes:  Recent Labs Lab 06/03/13 0725 06/03/13 0920 06/03/13 1515 06/07/13 1623  TROPONINI <0.30 <0.30 <0.30 <0.30   CBG:  Recent Labs Lab 06/06/13 1943 06/06/13 2359 06/07/13 0430 06/07/13 0743 06/07/13 1227  GLUCAP 321* 292* 202* 165* 261*    Recent Results (from the past 240 hour(s))  CULTURE, BLOOD (ROUTINE X 2)     Status: None   Collection Time    06/02/13 10:50 PM      Result Value Ref Range Status   Specimen Description BLOOD LEFT ARM   Final   Special Requests BOTTLES DRAWN AEROBIC AND ANAEROBIC 10CC EACH   Final   Culture  Setup Time     Final   Value: 06/03/2013 12:30     Performed at [facility]   Culture     Final   Value:        BLOOD CULTURE RECEIVED NO GROWTH TO DATE CULTURE WILL BE HELD FOR 5 DAYS BEFORE ISSUING A FINAL NEGATIVE REPORT     Performed at [facility]   Report Status PENDING   Incomplete  CULTURE, BLOOD (ROUTINE X 2)     Status: None   Collection Time    06/02/13 10:55 PM      Result Value Ref Range Status   Specimen Description BLOOD LEFT HAND   Final   Special  Requests BOTTLES DRAWN AEROBIC ONLY 10CC   Final   Culture  Setup Time     Final   Value: 06/03/2013 12:30     Performed at [facility]   Culture     Final   Value:        BLOOD CULTURE RECEIVED NO GROWTH TO DATE CULTURE WILL BE HELD FOR 5 DAYS BEFORE ISSUING A FINAL NEGATIVE REPORT     Performed at [facility]   Report Status PENDING   Incomplete  CULTURE, ROUTINE-ABSCESS     Status: None   Collection Time  06/03/13 12:20 AM      Result Value Ref Range Status   Specimen Description ABSCESS RIGHT FOOT   Final   Special Requests ANKLE PATIENT ON FOLLOWING ZOSYN VANCOMYCIN   Final   Gram Stain     Final   Value: ABUNDANT WBC PRESENT, PREDOMINANTLY PMN     RARE SQUAMOUS EPITHELIAL CELLS PRESENT     ABUNDANT GRAM POSITIVE COCCI IN PAIRS     IN CLUSTERS     Performed at [facility]   Culture     Final   Value: MODERATE STAPHYLOCOCCUS AUREUS     Note: RIFAMPIN AND GENTAMICIN SHOULD NOT BE USED AS SINGLE DRUGS FOR TREATMENT OF STAPH INFECTIONS. This organism is presumed to be Clindamycin resistant based on detection of inducible Clindamycin resistance.     Performed at [facility]   Report Status 06/05/2013 FINAL   Final   Organism ID, Bacteria STAPHYLOCOCCUS AUREUS   Final  ANAEROBIC CULTURE     Status: None   Collection Time    06/03/13 12:20 AM      Result Value Ref Range Status   Specimen Description ABSCESS RIGHT FOOT   Final   Special Requests ANKLE PATIENT ON FOLLOWING ZOSYN VANCOMYCIN   Final   Gram Stain     Final   Value: ABUNDANT WBC PRESENT, PREDOMINANTLY PMN     RARE SQUAMOUS EPITHELIAL CELLS PRESENT     ABUNDANT GRAM POSITIVE COCCI IN PAIRS     IN CLUSTERS     Performed at [facility]   Culture     Final   Value: NO ANAEROBES ISOLATED; CULTURE IN PROGRESS FOR 5 DAYS     Performed at [facility]   Report Status PENDING   Incomplete  MRSA PCR SCREENING     Status: None   Collection Time    06/03/13  3:35 AM       Result Value Ref Range Status   MRSA by PCR NEGATIVE  NEGATIVE Final   Comment:            The GeneXpert MRSA Assay (FDA     approved for NASAL specimens     only), is one component of a     comprehensive MRSA colonization     surveillance program. It is not     intended to diagnose MRSA     infection nor to guide or     monitor treatment for     MRSA infections.  CULTURE, ROUTINE-ABSCESS     Status: None   Collection Time    06/04/13  5:59 PM      Result Value Ref Range Status   Specimen Description ABSCESS ANKLE RIGHT   Final   Special Requests PATIENT ON FOLLOWING VANCOMYCIN, UNASYN   Final   Gram Stain     Final   Value: ABUNDANT WBC PRESENT,BOTH PMN AND MONONUCLEAR     NO SQUAMOUS EPITHELIAL CELLS SEEN     FEW GRAM POSITIVE COCCI     IN PAIRS IN CLUSTERS     Performed at [facility]   Culture     Final   Value: MODERATE STAPHYLOCOCCUS AUREUS     Note: RIFAMPIN AND GENTAMICIN SHOULD NOT BE USED AS SINGLE DRUGS FOR TREATMENT OF STAPH INFECTIONS. This organism is presumed to be Clindamycin resistant based on detection of inducible Clindamycin resistance.     Performed at [facility]   Report Status 06/07/2013 FINAL   Final  Organism ID, Bacteria STAPHYLOCOCCUS AUREUS   Final  ANAEROBIC CULTURE     Status: None   Collection Time    06/04/13  5:59 PM      Result Value Ref Range Status   Specimen Description ABSCESS ANKLE RIGHT   Final   Special Requests PATIENT ON FOLLOWING VANCOMYCIN, UNASYN   Final   Gram Stain     Final   Value: ABUNDANT WBC PRESENT,BOTH PMN AND MONONUCLEAR     NO SQUAMOUS EPITHELIAL CELLS SEEN     FEW GRAM POSITIVE COCCI     IN PAIRS IN CLUSTERS     Performed at [facility]   Culture     Final   Value: NO ANAEROBES ISOLATED; CULTURE IN PROGRESS FOR 5 DAYS     Performed at [facility]   Report Status PENDING   Incomplete  ANAEROBIC CULTURE     Status: None   Collection Time    06/04/13  6:07 PM      Result Value  Ref Range Status   Specimen Description ABSCESS RIGHT LEG   Final   Special Requests     Final   Value: PATIENT ON FOLLOWING VANCOMYCIN UNASYN CULTURES FROM RIGHT CALF   Gram Stain     Final   Value: FEW WBC PRESENT,BOTH PMN AND MONONUCLEAR     NO SQUAMOUS EPITHELIAL CELLS SEEN     FEW GRAM POSITIVE COCCI     IN PAIRS     Performed at [facility]   Culture     Final   Value: NO ANAEROBES ISOLATED; CULTURE IN PROGRESS FOR 5 DAYS     Performed at [facility]   Report Status PENDING   Incomplete  CULTURE, ROUTINE-ABSCESS     Status: None   Collection Time    06/04/13  6:07 PM      Result Value Ref Range Status   Specimen Description ABSCESS RIGHT LEG   Final   Special Requests     Final   Value: PATIENT ON FOLLOWING VANCOMYCIN UNASYN CULTURES FROM RIGHT CALF   Gram Stain     Final   Value: RARE WBC PRESENT,BOTH PMN AND MONONUCLEAR     NO SQUAMOUS EPITHELIAL CELLS SEEN     FEW GRAM POSITIVE COCCI     IN PAIRS     Performed at [facility]   Culture     Final   Value: MODERATE STAPHYLOCOCCUS AUREUS     Note: SUSCEPTIBILITIES PERFORMED ON PREVIOUS CULTURE WITHIN THE LAST 5 DAYS.     Performed at [facility]   Report Status 06/07/2013 FINAL   Final     Studies:  Recent x-ray studies have been reviewed in detail by the Attending Physician  Scheduled Meds:  Scheduled Meds: . docusate sodium  100 mg Oral BID  . feeding supplement (GLUCERNA SHAKE)  237 mL Oral TID BM  . insulin aspart  0-20 Units Subcutaneous 6 times per day  . insulin glargine  5 Units Subcutaneous QHS  . labetalol  200 mg Oral BID  . multivitamin  1 tablet Oral QHS  . piperacillin-tazobactam (ZOSYN)  IV  2.25 g Intravenous Q8H  . polyethylene glycol  17 g Oral Daily  . sevelamer carbonate  800 mg Oral TID WC  . vancomycin  1,000 mg Intravenous Q M,W,F-HD    Time spent on care of this patient: 35 mins   Allie Bossier , ANP   [facility] Office  [phone] Pager - Text Page per Shea Evans as per below:  On-Call/Text Page:      [email or web address]      password [email or web address]  If 7PM-7AM, please contact night-coverage [email or web address] Password [email or web address] 06/07/2013, 7:36 PM   LOS: 5 days   Examined patient and discussed assessment and plan with ANP Ebony Hail and agree with plan.  Plan discussed with patient, family and all questions answered  Greater than 35 minutes was direct patient care

## 2013-06-07 NOTE — Progress Notes (Signed)
Utilization review completed. Candida Vetter, RN, BSN. 

## 2013-06-07 NOTE — Progress Notes (Signed)
ANTICOAGULATION CONSULT NOTE - Follow Up Consult  Pharmacy Consult for heparin Indication: RUE DVT  No Known Allergies  Patient Measurements: Height: 6\' 1"  (185.4 cm) Weight: 233 lb 11 oz (106 kg) IBW/kg (Calculated) : 79.9 Heparin Dosing Weight: 103 kg  Vital Signs: Temp: 97.5 F (36.4 C) (05/14 1130) Temp src: Oral (05/14 1130) BP: 161/69 mmHg (05/14 1130) Pulse Rate: 87 (05/14 1130)  Labs:  Recent Labs  06/06/13 0659 06/06/13 0707 06/07/13 0130 06/07/13 0500 06/07/13 1029  HGB 8.0*  --   --  6.3*  --   HCT 24.7*  --   --  19.9*  --   PLT 284  --   --  330  --   HEPARINUNFRC  --  <0.10* <0.10*  --  0.15*  CREATININE 7.47*  --   --   --   --     Estimated Creatinine Clearance: 16.3 ml/min (by C-G formula based on Cr of 7.47).  Assessment: [name] on heparin for RUE DVT.  S/p I&D yesterday.  Hgb down to 6.3 with plan to transfuse 1 unit PRBC today.  Per RN, no overt bleeding noted but there are some drainage from wound site.     Goal of Therapy:  Heparin level 0.3-0.5 units/ml Monitor platelets by anticoagulation protocol: Yes   Plan:  1) increase heparin drip to 1950 units/hr 2) recheck level in 8 hours 3) ?de-escalate abx  Azell Bill P Danean Marner 06/07/2013,11:37 AM

## 2013-06-07 NOTE — Progress Notes (Signed)
ANTICOAGULATION CONSULT NOTE - Follow Up Consult  Pharmacy Consult for heparin Indication: RUE DVT  No Known Allergies  Patient Measurements: Height: 6\' 1"  (185.4 cm) Weight: 233 lb 11 oz (106 kg) IBW/kg (Calculated) : 79.9 Heparin Dosing Weight: 103 kg  Vital Signs: Temp: 99.9 F (37.7 C) (05/14 1858) Temp src: Oral (05/14 1858) BP: 135/64 mmHg (05/14 1858) Pulse Rate: 114 (05/14 1858)  Labs:  Recent Labs  06/06/13 0659  06/07/13 0130 06/07/13 0500 06/07/13 1029 06/07/13 1623 06/07/13 2045  HGB 8.0*  --   --  6.3*  --  7.1*  --   HCT 24.7*  --   --  19.9*  --  21.5*  --   PLT 284  --   --  330  --  334  --   HEPARINUNFRC  --   < > <0.10*  --  0.15*  --  0.14*  CREATININE 7.47*  --   --   --   --   --   --   TROPONINI  --   --   --   --   --  <0.30  --   < > = values in this interval not displayed.  Estimated Creatinine Clearance: 16.3 ml/min (by C-G formula based on Cr of 7.47).  Assessment: [name] on heparin for RUE DVT.  S/p I&D yesterday.  Hgb down to 6.3 with plan to transfuse 1 unit PRBC today.  Per RN, no overt bleeding noted but there are some drainage from wound site.  Heparin is subtherapeutic again. Will not bolus due to risk of bleeding.   Goal of Therapy:  Heparin level 0.3-0.5 units/ml Monitor platelets by anticoagulation protocol: Yes   Plan:  1) increase heparin drip to 2200 units/hr 2) recheck level AM

## 2013-06-07 NOTE — Progress Notes (Signed)
ANTICOAGULATION CONSULT NOTE   Pharmacy Consult for Heparin Indication: RUE DVT  No Known Allergies  Patient Measurements: Height: 6\' 1"  (185.4 cm) Weight: 232 lb 5.8 oz (105.4 kg) IBW/kg (Calculated) : 79.9 Heparin Dosing Weight: 103 kg  Vital Signs: Temp: 98.2 F (36.8 C) (05/13 2359) Temp src: Oral (05/14 0000) BP: 114/41 mmHg (05/14 0000) Pulse Rate: 94 (05/14 0000)  Labs:  Recent Labs  06/04/13 0800 06/06/13 0207 06/06/13 0659 06/06/13 0707 06/07/13 0130  HGB 7.5*  --  8.0*  --   --   HCT 23.9*  --  24.7*  --   --   PLT 300  --  284  --   --   HEPARINUNFRC  --  <0.10*  --  <0.10* <0.10*  CREATININE 8.19*  --  7.47*  --   --     Estimated Creatinine Clearance: 16.2 ml/min (by C-G formula based on Cr of 7.47).   Assessment: Steven Logan initiated on IV heparin for extensive RUE DVT. S/p I&D of extensive right leg wound today. Orders received from Dr. Sharol Given to restart heparin post-op. Heparin level remains undetectable on 1450 units/hr. No issues with line per RN. No bleeding noted.  Goal of Therapy:  Heparin level 0.3-0.5 units/ml Monitor platelets by anticoagulation protocol: Yes   Plan:  1. Increase heparin to 1750 units/hr. No bolus with recent surgery. 2. Recheck 8 hour heparin level 3. Daily heparin level and CBC 4. Monitor for signs and symptoms of bleeding.   Sherlon Handing, PharmD, BCPS Clinical pharmacist, pager [phone] 06/07/2013 2:22 AM

## 2013-06-07 NOTE — Progress Notes (Signed)
[facility] ROUNDING NOTE   Subjective:   Interval History: comfortable no complaints of shortness of breath  Pain seems controlled  Objective:  Vital signs in last 24 hours:  Temp:  [97.7 F (36.5 C)-99.1 F (37.3 C)] 97.7 F (36.5 C) (05/14 0822) Pulse Rate:  [87-114] 92 (05/14 0822) Resp:  [13-28] 16 (05/14 0822) BP: (100-151)/(41-95) 133/55 mmHg (05/14 0700) SpO2:  [94 %-100 %] 96 % (05/14 0822) Weight:  [105.4 kg (232 lb 5.8 oz)-106 kg (233 lb 11 oz)] 106 kg (233 lb 11 oz) (05/14 0400)  Weight change: -3.9 kg (-8 lb 9.6 oz) Filed Weights   06/06/13 0655 06/06/13 1117 06/07/13 0400  Weight: 109.3 kg (240 lb 15.4 oz) 105.4 kg (232 lb 5.8 oz) 106 kg (233 lb 11 oz)    Intake/Output: I/O last 3 completed shifts: In: 1468.7 [P.O.:240; I.V.:1028.7; IV Piggyback:200] Out: 4000 [Other:4000]   Intake/Output this shift:     CVS- RRR  RS- CTA  ABD- BS present soft non-distended  EXT- no edema  RUE with significant edema extending from hand to shoulder;  RLE wrapped with bandage    Basic Metabolic Panel:  Recent Labs Lab 06/02/13 1423 06/03/13 0725 06/04/13 0800 06/06/13 0659  NA 136* 134* 134* 134*  K 4.5 4.6 4.6 4.5  CL 92* 94* 91* 91*  CO2 23 24 21 23   GLUCOSE 260* 211* 141* 203*  BUN 65* 84* 102* 86*  CREATININE 5.70* 6.69* 8.19* 7.47*  CALCIUM 9.3 8.7 8.7 8.5  MG  --  2.5  --   --   PHOS  --  6.7* 8.4* 8.8*    Liver Function Tests:  Recent Labs Lab 06/02/13 1423 06/03/13 0725 06/04/13 0800 06/06/13 0659  AST 15 13  --   --   ALT 17 14  --   --   ALKPHOS 166* 161*  --   --   BILITOT 0.5 0.6  --   --   PROT 8.3 7.6  --   --   ALBUMIN 1.9* 1.6* 1.5* 1.5*   No results found for this basename: LIPASE, AMYLASE,  in the last 168 hours No results found for this basename: AMMONIA,  in the last 168 hours  CBC:  Recent Labs Lab 06/02/13 1423 06/03/13 0725 06/04/13 0800 06/06/13 0659 06/07/13 0500  WBC 15.6* 14.8* 17.4* 21.6*  23.6*  HGB 9.7* 8.2* 7.5* 8.0* 6.3*  HCT 30.7* 26.0* 23.9* 24.7* 19.9*  MCV 82.5 82.5 81.8 82.6 84.7  PLT 368 331 300 284 330    Cardiac Enzymes:  Recent Labs Lab 06/03/13 0725 06/03/13 0920 06/03/13 1515  TROPONINI <0.30 <0.30 <0.30    BNP: No components found with this basename: POCBNP,   CBG:  Recent Labs Lab 06/06/13 1729 06/06/13 1943 06/06/13 2359 06/07/13 0430 06/07/13 0743  GLUCAP 171* 321* 292* 202* 165*    Microbiology: Results for orders placed during the hospital encounter of 06/02/13  CULTURE, BLOOD (ROUTINE X 2)     Status: None   Collection Time    06/02/13 10:50 PM      Result Value Ref Range Status   Specimen Description BLOOD LEFT ARM   Final   Special Requests BOTTLES DRAWN AEROBIC AND ANAEROBIC 10CC EACH   Final   Culture  Setup Time     Final   Value: 06/03/2013 12:30     Performed at [facility]   Culture     Final   Value:  BLOOD CULTURE RECEIVED NO GROWTH TO DATE CULTURE WILL BE HELD FOR 5 DAYS BEFORE ISSUING A FINAL NEGATIVE REPORT     Performed at [facility]   Report Status PENDING   Incomplete  CULTURE, BLOOD (ROUTINE X 2)     Status: None   Collection Time    06/02/13 10:55 PM      Result Value Ref Range Status   Specimen Description BLOOD LEFT HAND   Final   Special Requests BOTTLES DRAWN AEROBIC ONLY 10CC   Final   Culture  Setup Time     Final   Value: 06/03/2013 12:30     Performed at [facility]   Culture     Final   Value:        BLOOD CULTURE RECEIVED NO GROWTH TO DATE CULTURE WILL BE HELD FOR 5 DAYS BEFORE ISSUING A FINAL NEGATIVE REPORT     Performed at [facility]   Report Status PENDING   Incomplete  CULTURE, ROUTINE-ABSCESS     Status: None   Collection Time    06/03/13 12:20 AM      Result Value Ref Range Status   Specimen Description ABSCESS RIGHT FOOT   Final   Special Requests ANKLE PATIENT ON FOLLOWING ZOSYN VANCOMYCIN   Final   Gram Stain     Final   Value:  ABUNDANT WBC PRESENT, PREDOMINANTLY PMN     RARE SQUAMOUS EPITHELIAL CELLS PRESENT     ABUNDANT GRAM POSITIVE COCCI IN PAIRS     IN CLUSTERS     Performed at [facility]   Culture     Final   Value: MODERATE STAPHYLOCOCCUS AUREUS     Note: RIFAMPIN AND GENTAMICIN SHOULD NOT BE USED AS SINGLE DRUGS FOR TREATMENT OF STAPH INFECTIONS. This organism is presumed to be Clindamycin resistant based on detection of inducible Clindamycin resistance.     Performed at [facility]   Report Status 06/05/2013 FINAL   Final   Organism ID, Bacteria STAPHYLOCOCCUS AUREUS   Final  ANAEROBIC CULTURE     Status: None   Collection Time    06/03/13 12:20 AM      Result Value Ref Range Status   Specimen Description ABSCESS RIGHT FOOT   Final   Special Requests ANKLE PATIENT ON FOLLOWING ZOSYN VANCOMYCIN   Final   Gram Stain     Final   Value: ABUNDANT WBC PRESENT, PREDOMINANTLY PMN     RARE SQUAMOUS EPITHELIAL CELLS PRESENT     ABUNDANT GRAM POSITIVE COCCI IN PAIRS     IN CLUSTERS     Performed at [facility]   Culture     Final   Value: NO ANAEROBES ISOLATED; CULTURE IN PROGRESS FOR 5 DAYS     Performed at [facility]   Report Status PENDING   Incomplete  MRSA PCR SCREENING     Status: None   Collection Time    06/03/13  3:35 AM      Result Value Ref Range Status   MRSA by PCR NEGATIVE  NEGATIVE Final   Comment:            The GeneXpert MRSA Assay (FDA     approved for NASAL specimens     only), is one component of a     comprehensive MRSA colonization     surveillance program. It is not     intended to diagnose MRSA     infection nor to  guide or     monitor treatment for     MRSA infections.  CULTURE, ROUTINE-ABSCESS     Status: None   Collection Time    06/04/13  5:59 PM      Result Value Ref Range Status   Specimen Description ABSCESS ANKLE RIGHT   Final   Special Requests PATIENT ON FOLLOWING VANCOMYCIN, UNASYN   Final   Gram Stain     Final   Value:  ABUNDANT WBC PRESENT,BOTH PMN AND MONONUCLEAR     NO SQUAMOUS EPITHELIAL CELLS SEEN     FEW GRAM POSITIVE COCCI     IN PAIRS IN CLUSTERS     Performed at [facility]   Culture     Final   Value: MODERATE STAPHYLOCOCCUS AUREUS     Note: RIFAMPIN AND GENTAMICIN SHOULD NOT BE USED AS SINGLE DRUGS FOR TREATMENT OF STAPH INFECTIONS. This organism is presumed to be Clindamycin resistant based on detection of inducible Clindamycin resistance.     Performed at [facility]   Report Status 06/07/2013 FINAL   Final   Organism ID, Bacteria STAPHYLOCOCCUS AUREUS   Final  ANAEROBIC CULTURE     Status: None   Collection Time    06/04/13  5:59 PM      Result Value Ref Range Status   Specimen Description ABSCESS ANKLE RIGHT   Final   Special Requests PATIENT ON FOLLOWING VANCOMYCIN, UNASYN   Final   Gram Stain     Final   Value: ABUNDANT WBC PRESENT,BOTH PMN AND MONONUCLEAR     NO SQUAMOUS EPITHELIAL CELLS SEEN     FEW GRAM POSITIVE COCCI     IN PAIRS IN CLUSTERS     Performed at [facility]   Culture     Final   Value: NO ANAEROBES ISOLATED; CULTURE IN PROGRESS FOR 5 DAYS     Performed at [facility]   Report Status PENDING   Incomplete  ANAEROBIC CULTURE     Status: None   Collection Time    06/04/13  6:07 PM      Result Value Ref Range Status   Specimen Description ABSCESS RIGHT LEG   Final   Special Requests     Final   Value: PATIENT ON FOLLOWING VANCOMYCIN UNASYN CULTURES FROM RIGHT CALF   Gram Stain     Final   Value: FEW WBC PRESENT,BOTH PMN AND MONONUCLEAR     NO SQUAMOUS EPITHELIAL CELLS SEEN     FEW GRAM POSITIVE COCCI     IN PAIRS     Performed at [facility]   Culture     Final   Value: NO ANAEROBES ISOLATED; CULTURE IN PROGRESS FOR 5 DAYS     Performed at [facility]   Report Status PENDING   Incomplete  CULTURE, ROUTINE-ABSCESS     Status: None   Collection Time    06/04/13  6:07 PM      Result Value Ref Range Status    Specimen Description ABSCESS RIGHT LEG   Final   Special Requests     Final   Value: PATIENT ON FOLLOWING VANCOMYCIN UNASYN CULTURES FROM RIGHT CALF   Gram Stain     Final   Value: RARE WBC PRESENT,BOTH PMN AND MONONUCLEAR     NO SQUAMOUS EPITHELIAL CELLS SEEN     FEW GRAM POSITIVE COCCI     IN PAIRS     Performed at [facility]  Culture     Final   Value: MODERATE STAPHYLOCOCCUS AUREUS     Note: SUSCEPTIBILITIES PERFORMED ON PREVIOUS CULTURE WITHIN THE LAST 5 DAYS.     Performed at [facility]   Report Status 06/07/2013 FINAL   Final    Coagulation Studies: No results found for this basename: LABPROT, INR,  in the last 72 hours  Urinalysis: No results found for this basename: COLORURINE, APPERANCEUR, LABSPEC, PHURINE, GLUCOSEU, HGBUR, BILIRUBINUR, KETONESUR, PROTEINUR, UROBILINOGEN, NITRITE, LEUKOCYTESUR,  in the last 72 hours    Imaging: Dg Chest Port 1 View  06/06/2013   CLINICAL DATA:  Status post central line placement  EXAM: PORTABLE CHEST - 1 VIEW  COMPARISON:  DG CHEST 2 VIEW dated 06/02/2013  FINDINGS: The patient has undergone placement of a central venous catheter via the left internal jugular approach. The tip of the catheter lies at the level of the junction of the right and left brachiocephalic veins. There is no evidence of a post procedure pneumothorax. The lung volumes are low. There is atelectasis and/or pleural fluid at the right lung base. The cardiopericardial silhouette remains enlarged. The pulmonary vascularity is not engorged.  IMPRESSION: There is no evidence of postprocedure complication following placement of the left internal jugular venous catheter.   Electronically Signed   By: David  Martinique   On: 06/06/2013 16:16     Medications:   . sodium chloride 10 mL/hr (06/06/13 1317)  . heparin 1,750 Units/hr (06/07/13 0238)   . docusate sodium  100 mg Oral BID  . insulin aspart  0-9 Units Subcutaneous 6 times per day  . labetalol  100 mg  Oral BID  . multivitamin  1 tablet Oral QHS  . piperacillin-tazobactam (ZOSYN)  IV  2.25 g Intravenous Q8H  . polyethylene glycol  17 g Oral Daily  . sevelamer carbonate  800 mg Oral TID WC  . 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   ESRD- Hemodialysis is schedule [facility]  ANEMIA- Hemoglobin 6.3  Consider transfusion will defer to primary team MBD- sevelemer  HTN/VOL- controlled at present  ACCESS- AVG no issues  OTHER- MRSA bacteremia Cultures + ve 5/8 Treated Vancomycin      LOS: [place] @TODAY @10 :43 AM

## 2013-06-07 NOTE — Progress Notes (Signed)
Inpatient Diabetes Program Recommendations  AACE/ADA: New Consensus Statement on Inpatient Glycemic Control (2013)  Target Ranges:  Prepandial:   less than 140 mg/dL      Peak postprandial:   less than 180 mg/dL (1-2 hours)      Critically ill patients:  140 - 180 mg/dL   Reason for Assessment:  Results for Steven Logan, Steven Logan (MRN [id number]) as of 06/07/2013 13:53  Ref. Range 06/06/2013 19:43 06/06/2013 23:59 06/07/2013 04:30 06/07/2013 07:43 06/07/2013 12:27  Glucose-Capillary Latest Range: 70-99 mg/dL 321 (H) 292 (H) 202 (H) 165 (H) 261 (H)    Diabetes history: Type 2 noted in H&P Outpatient Diabetes medications: None Current orders for Inpatient glycemic control: Novolog sensitive q 4 hours  If CBG's remain greater than 200 mg/dL, may consider adding Levemir 5 units daily.    Adah Perl, RN, BC-ADM Inpatient Diabetes Coordinator Pager [phone]

## 2013-06-08 ENCOUNTER — Encounter (HOSPITAL_COMMUNITY): Payer: Self-pay | Admitting: Orthopedic Surgery

## 2013-06-08 LAB — TYPE AND SCREEN
ABO/RH(D): A POS
ANTIBODY SCREEN: NEGATIVE
Unit division: 0
Unit division: 0
Unit division: 0

## 2013-06-08 LAB — HEMOGLOBIN A1C
Hgb A1c MFr Bld: 7 % — ABNORMAL HIGH (ref <5.7)
Mean Plasma Glucose: 154 mg/dL — ABNORMAL HIGH (ref <117)

## 2013-06-08 LAB — ANAEROBIC CULTURE

## 2013-06-08 LAB — GLUCOSE, CAPILLARY
GLUCOSE-CAPILLARY: 148 mg/dL — AB (ref 70–99)
Glucose-Capillary: 135 mg/dL — ABNORMAL HIGH (ref 70–99)
Glucose-Capillary: 152 mg/dL — ABNORMAL HIGH (ref 70–99)
Glucose-Capillary: 152 mg/dL — ABNORMAL HIGH (ref 70–99)
Glucose-Capillary: 199 mg/dL — ABNORMAL HIGH (ref 70–99)

## 2013-06-08 LAB — CBC
HCT: 20.3 % — ABNORMAL LOW (ref 39.0–52.0)
Hemoglobin: 6.6 g/dL — CL (ref 13.0–17.0)
MCH: 26.9 pg (ref 26.0–34.0)
MCHC: 32.5 g/dL (ref 30.0–36.0)
MCV: 82.9 fL (ref 78.0–100.0)
PLATELETS: 381 10*3/uL (ref 150–400)
RBC: 2.45 MIL/uL — AB (ref 4.22–5.81)
RDW: 16.3 % — AB (ref 11.5–15.5)
WBC: 30.4 10*3/uL — AB (ref 4.0–10.5)

## 2013-06-08 LAB — BASIC METABOLIC PANEL
BUN: 74 mg/dL — ABNORMAL HIGH (ref 6–23)
CO2: 23 mEq/L (ref 19–32)
Calcium: 8.1 mg/dL — ABNORMAL LOW (ref 8.4–10.5)
Chloride: 93 mEq/L — ABNORMAL LOW (ref 96–112)
Creatinine, Ser: 7.2 mg/dL — ABNORMAL HIGH (ref 0.50–1.35)
GFR calc Af Amer: 10 mL/min — ABNORMAL LOW (ref >90)
GFR calc non Af Amer: 8 mL/min — ABNORMAL LOW (ref >90)
Glucose, Bld: 160 mg/dL — ABNORMAL HIGH (ref 70–99)
Potassium: 4.7 mEq/L (ref 3.7–5.3)
Sodium: 133 mEq/L — ABNORMAL LOW (ref 137–147)

## 2013-06-08 LAB — HEPARIN LEVEL (UNFRACTIONATED)
HEPARIN UNFRACTIONATED: 0.11 [IU]/mL — AB (ref 0.30–0.70)
Heparin Unfractionated: 0.23 IU/mL — ABNORMAL LOW (ref 0.30–0.70)

## 2013-06-08 LAB — PREPARE RBC (CROSSMATCH)

## 2013-06-08 LAB — TROPONIN I

## 2013-06-08 MED ORDER — SODIUM CHLORIDE 0.9 % IV SOLN: 100.0000 mL | INTRAVENOUS | Status: DC | PRN

## 2013-06-08 MED ORDER — LIDOCAINE HCL (PF) 1 % IJ SOLN: 5.0000 mL | INTRAMUSCULAR | Status: DC | PRN

## 2013-06-08 MED ORDER — LIDOCAINE-PRILOCAINE 2.5-2.5 % EX CREA
1.0000 "application " | TOPICAL_CREAM | CUTANEOUS | Status: DC | PRN
  Filled 2013-06-08: qty 5

## 2013-06-08 MED ORDER — PENTAFLUOROPROP-TETRAFLUOROETH EX AERO: 1.0000 "application " | INHALATION_SPRAY | CUTANEOUS | Status: DC | PRN

## 2013-06-08 MED ORDER — HEPARIN SODIUM (PORCINE) 1000 UNIT/ML DIALYSIS: 1000.0000 [IU] | INTRAMUSCULAR | Status: DC | PRN

## 2013-06-08 MED ORDER — ALTEPLASE 2 MG IJ SOLR
2.0000 mg | Freq: Once | INTRAMUSCULAR | Status: DC | PRN
  Filled 2013-06-08: qty 2

## 2013-06-08 MED ORDER — HYDROMORPHONE HCL PF 1 MG/ML IJ SOLN
INTRAMUSCULAR | Status: AC
  Administered 2013-06-08: 1 mg via INTRAVENOUS
  Filled 2013-06-08: qty 1

## 2013-06-08 MED ORDER — HYDROMORPHONE HCL PF 1 MG/ML IJ SOLN
INTRAMUSCULAR | Status: AC
  Filled 2013-06-08: qty 1

## 2013-06-08 MED ORDER — NEPRO/CARBSTEADY PO LIQD
237.0000 mL | ORAL | Status: DC | PRN
  Filled 2013-06-08: qty 237

## 2013-06-08 MED ORDER — INSULIN ASPART 100 UNIT/ML ~~LOC~~ SOLN
0.0000 [IU] | Freq: Three times a day (TID) | SUBCUTANEOUS | Status: DC
  Administered 2013-06-09: 3 [IU] via SUBCUTANEOUS
  Administered 2013-06-09: 7 [IU] via SUBCUTANEOUS
  Administered 2013-06-09: 11 [IU] via SUBCUTANEOUS
  Administered 2013-06-10 (×2): 3 [IU] via SUBCUTANEOUS
  Administered 2013-06-13: 4 [IU] via SUBCUTANEOUS
  Administered 2013-06-14 – 2013-06-16 (×3): 3 [IU] via SUBCUTANEOUS
  Administered 2013-06-19: 4 [IU] via SUBCUTANEOUS
  Administered 2013-06-22 – 2013-06-23 (×2): 3 [IU] via SUBCUTANEOUS
  Administered 2013-06-24: 4 [IU] via SUBCUTANEOUS
  Administered 2013-06-28 – 2013-06-29 (×2): 3 [IU] via SUBCUTANEOUS

## 2013-06-08 NOTE — Procedures (Signed)
I have seen and examined this patient and agree with the plan of care. Seen on dialysis and tolerating well  Sherril Croon 06/08/2013, 1:15 PM

## 2013-06-08 NOTE — Progress Notes (Signed)
ANTICOAGULATION CONSULT NOTE - Follow Up Consult  Pharmacy Consult for heparin Indication: RUE DVT  No Known Allergies  Patient Measurements: Height: 6\' 1"  (185.4 cm) Weight: 235 lb 7.2 oz (106.8 kg) IBW/kg (Calculated) : 79.9 Heparin Dosing Weight: 103 kg  Vital Signs: Temp: 99 F (37.2 C) (05/15 2109) Temp src: Oral (05/15 2109) BP: 146/77 mmHg (05/15 2207) Pulse Rate: 100 (05/15 2207)  Labs:  Recent Labs  06/06/13 0659  06/07/13 0500  06/07/13 1623 06/07/13 2045 06/08/13 0318 06/08/13 2058  HGB 8.0*  --  6.3*  --  7.1*  --  6.6*  --   HCT 24.7*  --  19.9*  --  21.5*  --  20.3*  --   PLT 284  --  330  --  334  --  381  --   HEPARINUNFRC  --   < >  --   < >  --  0.14* 0.23* 0.11*  CREATININE 7.47*  --   --   --   --   --  7.20*  --   TROPONINI  --   --   --   --  <0.30 <0.30 <0.30  --   < > = values in this interval not displayed.  Estimated Creatinine Clearance: 17 ml/min (by C-G formula based on Cr of 7.2).  Assessment: [name] on heparin for RUE DVT.  S/p I&D 5/13.  Hgb 6.6 (s/p PRBC x 1 on 5/14).   Heparin level reported as < goal drawn after blood and shortly after HD.  Spoke with RN, heparin infusing at 2400 units/hr and patients fascitomy had dressing change this evening, per RN dressings are now saturated.  She is concerned it is bleeding and has contacted surgery.  Goal of Therapy:  Heparin level 0.3-0.5 units/ml Monitor platelets by anticoagulation protocol: Yes   Plan:  Continue heparin drip to 2400 units/hr, given bleeding and potential for heparin to further accumulate will continue at this rate and follow up with heparin level and CBC in am  Thank you for allowing pharmacy to be a part of this patients care team.  Rowe Robert Pharm.D., BCPS, AQ-Cardiology Clinical Pharmacist 06/08/2013 10:23 PM Pager: [phone] Phone: [phone]

## 2013-06-08 NOTE — Progress Notes (Signed)
ANTICOAGULATION CONSULT NOTE - Follow Up Consult  Pharmacy Consult for heparin Indication: RUE DVT  No Known Allergies  Patient Measurements: Height: 6\' 1"  (185.4 cm) Weight: 233 lb 11 oz (106 kg) IBW/kg (Calculated) : 79.9 Heparin Dosing Weight: 103 kg  Vital Signs: Temp: 99.8 F (37.7 C) (05/15 0000) Temp src: Oral (05/15 0000) BP: 136/56 mmHg (05/14 2137) Pulse Rate: 118 (05/14 2137)  Labs:  Recent Labs  06/06/13 0659  06/07/13 0500 06/07/13 1029 06/07/13 1623 06/07/13 2045 06/08/13 0318  HGB 8.0*  --  6.3*  --  7.1*  --   --   HCT 24.7*  --  19.9*  --  21.5*  --   --   PLT 284  --  330  --  334  --   --   HEPARINUNFRC  --   < >  --  0.15*  --  0.14* 0.23*  CREATININE 7.47*  --   --   --   --   --   --   TROPONINI  --   --   --   --  <0.30 <0.30 <0.30  < > = values in this interval not displayed.  Estimated Creatinine Clearance: 16.3 ml/min (by C-G formula based on Cr of 7.47).  Assessment: [name] on heparin for RUE DVT.  S/p I&D 5/13.  Hgb 6.6 (s/p PRBC x 1 on 5/14).  Per RN, no overt bleeding noted.  Heparin remains subtherapeutic. Will not bolus due to risk of bleeding.   Goal of Therapy:  Heparin level 0.3-0.5 units/ml Monitor platelets by anticoagulation protocol: Yes   Plan:  1) Increase heparin drip to 2400 units/hr 2) F/u 8 hr heparin level  Sherlon Handing, PharmD, BCPS Clinical pharmacist, pager [phone] 06/08/2013  4:02 AM

## 2013-06-08 NOTE — Progress Notes (Signed)
K. Black was notified that pharmacy wants to increase the heparin to 24 ml/hr and is fine with that with a hemoglobin of 6.6.

## 2013-06-08 NOTE — Progress Notes (Signed)
[facility] TEAM 1 - Stepdown/ICU TEAM Progress Note  Steven Logan [id number] DOB: 09/18/1970 DOA: 06/02/2013 PCP: Tivis Ringer, MD  Admit HPI / Brief Narrative: 43 y.o. M w/ a history of Morbid obesity; Gout; HTN; Dyslipidemia; Pulmonary embolism; Pancreatitis; Arthritis; Depression; Diabetes mellitus; and chronic anemia who presented with right ankle swelling for 2 weeks.  At first it was diagnosed as gout and he was started on prednisone without improvement. Patient reported that he bumped his leg and a wound opened up and started to ooze pus. Patient subsequently re-presented to the ER.  An MRI of the ankle revealed gas-containing abscess and cellulitis. Orthopedics took him emergently to the OR for debridement. Pt also reported right arm swelling for 2 days.    HPI/Subjective: Alert and endorsing back pain from laying in bed - still with RUE/RLE pain.  No CP or SOB.    Assessment/Plan:  Severe sepsis due to septic ankle & abscess of right lower leg -BP has improved  -cont empiric antibiotics  -requiring multiple trips to OR for I&D   -cultures revealing MSSA from wound/op site -concerning that WBC has trended up to ~30,000 - follow  Acute nonocclusive DVT RUE - jugular, subclavian, axillary, and brachial veins involved per prelim report - avoid access/blood draws in R arm - cont IV Heparin - convert to oral when OK per surgery - recent removal of HD vascath from right IJ 5/10 (had been in place since Aug 2014)  Poor venous access -now has IV in right foot and has HD access left FA -for now obtain labs in HD -postop note patient has central line left IJ  Encephalopathy, toxic -resolved  Anemia in chronic kidney disease / acute postoperative blood loss anemia  -per Renal regarding chronic component -transfusing as needed in HD   End stage renal disease on HD M/W/F - per Renal  Hypertension -BP stable at present   Hyperglycemia -Continue resistant  SSI -Hemoglobin A1c 7.0  Code Status: FULL Family Communication: no family present at time of exam  Disposition Plan: SDU-PT/OT as recommended by orthopedic physician-if remains hemodynamically stable consider transferring to telemetry bed over weekend   Consultants: Ortho Nephro  Procedures: I and D on 06/03/2013  IRRIGATION AND DEBRIDEMENT EXTREMITY extensive excision of muscle fascia and soft tissue right leg 06/06/13  Antibiotics: Vanc 5/9 > Zosyn 5/9 >  DVT prophylaxis: IV heparin   Objective: Blood pressure 147/69, pulse 84, temperature 98.7 F (37.1 C), temperature source Oral, resp. rate 14, height 6\' 1"  (1.854 m), weight 233 lb 11 oz (106 kg), SpO2 100.00%.  Intake/Output Summary (Last 24 hours) at 06/08/13 1303 Last data filed at 06/08/13 0934  Gross per 24 hour  Intake 1490.41 ml  Output      0 ml  Net 1490.41 ml   Exam: General: No acute respiratory distress Lungs: Clear to auscultation bilaterally without wheezes or crackles, RA Cardiovascular: Regular rate and rhythm without murmur gallop or rub  Abdomen: Nontender, nondistended, soft, bowel sounds positive, no rebound, no ascites, no appreciable mass Extremities: No significant cyanosis, clubbing, RUE with decreasing edema extending from hand to shoulder; also edema RLE with bandage in place  Data Reviewed: Basic Metabolic Panel:  Recent Labs Lab 06/02/13 1423 06/03/13 0725 06/04/13 0800 06/06/13 0659 06/08/13 0318  NA 136* 134* 134* 134* 133*  K 4.5 4.6 4.6 4.5 4.7  CL 92* 94* 91* 91* 93*  CO2 23 24 21 23 23   GLUCOSE 260* 211* 141* 203* 160*  BUN 65* 84* 102* 86* 74*  CREATININE 5.70* 6.69* 8.19* 7.47* 7.20*  CALCIUM 9.3 8.7 8.7 8.5 8.1*  MG  --  2.5  --   --   --   PHOS  --  6.7* 8.4* 8.8*  --    Liver Function Tests:  Recent Labs Lab 06/02/13 1423 06/03/13 0725 06/04/13 0800 06/06/13 0659  AST 15 13  --   --   ALT 17 14  --   --   ALKPHOS 166* 161*  --   --   BILITOT 0.5 0.6  --    --   PROT 8.3 7.6  --   --   ALBUMIN 1.9* 1.6* 1.5* 1.5*   CBC:  Recent Labs Lab 06/04/13 0800 06/06/13 0659 06/07/13 0500 06/07/13 1623 06/08/13 0318  WBC 17.4* 21.6* 23.6* 26.5* 30.4*  HGB 7.5* 8.0* 6.3* 7.1* 6.6*  HCT 23.9* 24.7* 19.9* 21.5* 20.3*  MCV 81.8 82.6 84.7 82.1 82.9  PLT 300 284 330 334 381   Cardiac Enzymes:  Recent Labs Lab 06/03/13 0920 06/03/13 1515 06/07/13 1623 06/07/13 2045 06/08/13 0318  TROPONINI <0.30 <0.30 <0.30 <0.30 <0.30   CBG:  Recent Labs Lab 06/07/13 2044 06/08/13 0005 06/08/13 0320 06/08/13 0811 06/08/13 1203  GLUCAP 129* 152* 152* 148* 135*    Recent Results (from the past 240 hour(s))  CULTURE, BLOOD (ROUTINE X 2)     Status: None   Collection Time    06/02/13 10:50 PM      Result Value Ref Range Status   Specimen Description BLOOD LEFT ARM   Final   Special Requests BOTTLES DRAWN AEROBIC AND ANAEROBIC 10CC EACH   Final   Culture  Setup Time     Final   Value: 06/03/2013 12:30     Performed at [facility]   Culture     Final   Value:        BLOOD CULTURE RECEIVED NO GROWTH TO DATE CULTURE WILL BE HELD FOR 5 DAYS BEFORE ISSUING A FINAL NEGATIVE REPORT     Performed at [facility]   Report Status PENDING   Incomplete  CULTURE, BLOOD (ROUTINE X 2)     Status: None   Collection Time    06/02/13 10:55 PM      Result Value Ref Range Status   Specimen Description BLOOD LEFT HAND   Final   Special Requests BOTTLES DRAWN AEROBIC ONLY 10CC   Final   Culture  Setup Time     Final   Value: 06/03/2013 12:30     Performed at [facility]   Culture     Final   Value:        BLOOD CULTURE RECEIVED NO GROWTH TO DATE CULTURE WILL BE HELD FOR 5 DAYS BEFORE ISSUING A FINAL NEGATIVE REPORT     Performed at [facility]   Report Status PENDING   Incomplete  CULTURE, ROUTINE-ABSCESS     Status: None   Collection Time    06/03/13 12:20 AM      Result Value Ref Range Status   Specimen Description  ABSCESS RIGHT FOOT   Final   Special Requests ANKLE PATIENT ON FOLLOWING ZOSYN VANCOMYCIN   Final   Gram Stain     Final   Value: ABUNDANT WBC PRESENT, PREDOMINANTLY PMN     RARE SQUAMOUS EPITHELIAL CELLS PRESENT     ABUNDANT GRAM POSITIVE COCCI IN PAIRS     IN CLUSTERS  Performed at [facility]     Final   Value: MODERATE STAPHYLOCOCCUS AUREUS     Note: RIFAMPIN AND GENTAMICIN SHOULD NOT BE USED AS SINGLE DRUGS FOR TREATMENT OF STAPH INFECTIONS. This organism is presumed to be Clindamycin resistant based on detection of inducible Clindamycin resistance.     Performed at [facility]   Report Status 06/05/2013 FINAL   Final   Organism ID, Bacteria STAPHYLOCOCCUS AUREUS   Final  ANAEROBIC CULTURE     Status: None   Collection Time    06/03/13 12:20 AM      Result Value Ref Range Status   Specimen Description ABSCESS RIGHT FOOT   Final   Special Requests ANKLE PATIENT ON FOLLOWING ZOSYN VANCOMYCIN   Final   Gram Stain     Final   Value: ABUNDANT WBC PRESENT, PREDOMINANTLY PMN     RARE SQUAMOUS EPITHELIAL CELLS PRESENT     ABUNDANT GRAM POSITIVE COCCI IN PAIRS     IN CLUSTERS     Performed at [facility]   Culture     Final   Value: NO ANAEROBES ISOLATED     Performed at [facility]   Report Status 06/08/2013 FINAL   Final  MRSA PCR SCREENING     Status: None   Collection Time    06/03/13  3:35 AM      Result Value Ref Range Status   MRSA by PCR NEGATIVE  NEGATIVE Final   Comment:            The GeneXpert MRSA Assay (FDA     approved for NASAL specimens     only), is one component of a     comprehensive MRSA colonization     surveillance program. It is not     intended to diagnose MRSA     infection nor to guide or     monitor treatment for     MRSA infections.  CULTURE, ROUTINE-ABSCESS     Status: None   Collection Time    06/04/13  5:59 PM      Result Value Ref Range Status   Specimen Description ABSCESS ANKLE RIGHT    Final   Special Requests PATIENT ON FOLLOWING VANCOMYCIN, UNASYN   Final   Gram Stain     Final   Value: ABUNDANT WBC PRESENT,BOTH PMN AND MONONUCLEAR     NO SQUAMOUS EPITHELIAL CELLS SEEN     FEW GRAM POSITIVE COCCI     IN PAIRS IN CLUSTERS     Performed at [facility]   Culture     Final   Value: MODERATE STAPHYLOCOCCUS AUREUS     Note: RIFAMPIN AND GENTAMICIN SHOULD NOT BE USED AS SINGLE DRUGS FOR TREATMENT OF STAPH INFECTIONS. This organism is presumed to be Clindamycin resistant based on detection of inducible Clindamycin resistance.     Performed at [facility]   Report Status 06/07/2013 FINAL   Final   Organism ID, Bacteria STAPHYLOCOCCUS AUREUS   Final  ANAEROBIC CULTURE     Status: None   Collection Time    06/04/13  5:59 PM      Result Value Ref Range Status   Specimen Description ABSCESS ANKLE RIGHT   Final   Special Requests PATIENT ON FOLLOWING VANCOMYCIN, UNASYN   Final   Gram Stain     Final   Value: ABUNDANT WBC PRESENT,BOTH PMN AND MONONUCLEAR     NO SQUAMOUS EPITHELIAL CELLS  SEEN     FEW GRAM POSITIVE COCCI     IN PAIRS IN CLUSTERS     Performed at [facility]   Culture     Final   Value: NO ANAEROBES ISOLATED; CULTURE IN PROGRESS FOR 5 DAYS     Performed at [facility]   Report Status PENDING   Incomplete  ANAEROBIC CULTURE     Status: None   Collection Time    06/04/13  6:07 PM      Result Value Ref Range Status   Specimen Description ABSCESS RIGHT LEG   Final   Special Requests     Final   Value: PATIENT ON FOLLOWING VANCOMYCIN UNASYN CULTURES FROM RIGHT CALF   Gram Stain     Final   Value: FEW WBC PRESENT,BOTH PMN AND MONONUCLEAR     NO SQUAMOUS EPITHELIAL CELLS SEEN     FEW GRAM POSITIVE COCCI     IN PAIRS     Performed at [facility]   Culture     Final   Value: NO ANAEROBES ISOLATED; CULTURE IN PROGRESS FOR 5 DAYS     Performed at [facility]   Report Status PENDING   Incomplete  CULTURE,  ROUTINE-ABSCESS     Status: None   Collection Time    06/04/13  6:07 PM      Result Value Ref Range Status   Specimen Description ABSCESS RIGHT LEG   Final   Special Requests     Final   Value: PATIENT ON FOLLOWING VANCOMYCIN UNASYN CULTURES FROM RIGHT CALF   Gram Stain     Final   Value: RARE WBC PRESENT,BOTH PMN AND MONONUCLEAR     NO SQUAMOUS EPITHELIAL CELLS SEEN     FEW GRAM POSITIVE COCCI     IN PAIRS     Performed at [facility]   Culture     Final   Value: MODERATE STAPHYLOCOCCUS AUREUS     Note: SUSCEPTIBILITIES PERFORMED ON PREVIOUS CULTURE WITHIN THE LAST 5 DAYS.     Performed at [facility]   Report Status 06/07/2013 FINAL   Final     Studies:  Recent x-ray studies have been reviewed in detail by the Attending Physician  Scheduled Meds:  Scheduled Meds: . docusate sodium  100 mg Oral BID  . feeding supplement (GLUCERNA SHAKE)  237 mL Oral TID BM  . insulin aspart  0-20 Units Subcutaneous 6 times per day  . insulin glargine  5 Units Subcutaneous QHS  . labetalol  200 mg Oral BID  . multivitamin  1 tablet Oral QHS  . piperacillin-tazobactam (ZOSYN)  IV  2.25 g Intravenous Q8H  . polyethylene glycol  17 g Oral Daily  . sevelamer carbonate  800 mg Oral TID WC  . vancomycin  1,000 mg Intravenous Q M,W,F-HD    Time spent on care of this patient: 35 mins   Samella Parr , ANP   [facility] Office  [phone] Pager - Text Page per Shea Evans as per below:  On-Call/Text Page:      [email or web address]      password [email or web address]  If 7PM-7AM, please contact night-coverage [email or web address] Password [email or web address] 06/08/2013, 1:03 PM   LOS: 6 days   I have personally examined this patient and reviewed the entire database. I have reviewed the above note, made any necessary editorial changes, and agree with its content.  Cherene Altes, MD [facility]

## 2013-06-08 NOTE — Progress Notes (Signed)
Physical Therapy Treatment Patient Details Name: Steven Logan MRN: [id number] DOB: 10-21-1970 Today's Date: 06/08/2013    History of Present Illness Pt is a 43 y/o male admitted with gas-containing abscess and cellulitis in RLE. Pt is currently NWB and another I&D is planned for 06/06/13. Per chart review there is a 50% chance of limb salvage.     PT Comments    Pt finishing up blood transfusion when PT entered. Pt reports feels dizzy and lethargic this morning. Pt had positioned himself on EOB with LE's extended and hips at very edge of bed. Therapist assisted pt to reposition hips towards center of bed for safety, as pt refused transfer to recliner or to return to supine position. Pt education regarding benefits of OOB and elevating LE's. Pt continues to refuse all mobility/exercise. RN notified of pt positioning and bed alarm set for safety. Will continue to progress per POC.   Follow Up Recommendations  Home health PT;Supervision/Assistance - 24 hour     Equipment Recommendations  Rolling walker with 5" wheels;3in1 (PT)    Recommendations for Other Services       Precautions / Restrictions Precautions Precautions: Fall Restrictions Weight Bearing Restrictions: Yes RLE Weight Bearing: Non weight bearing    Mobility  Bed Mobility Overal bed mobility: Needs Assistance             General bed mobility comments: Very laborious for pt to scoot his hips backwards on the bed. Pt originally sitting on very edge of bed with LE's extended and goal was to shift hips to center of bed. Bed pad used to assist with scooting.   Transfers                 General transfer comment: Pt refusing any OOB at this time.   Ambulation/Gait                 Stairs            Wheelchair Mobility    Modified Rankin (Stroke Patients Only)       Balance Overall balance assessment: Needs assistance Sitting-balance support: Feet supported;Bilateral upper extremity  supported Sitting balance-Leahy Scale: Fair Sitting balance - Comments: Pt able to sit EOB  without assist however appears unsteady.                             Cognition Arousal/Alertness: Lethargic;Suspect due to medications Behavior During Therapy: Flat affect Overall Cognitive Status: Within Functional Limits for tasks assessed                      Exercises      General Comments        Pertinent Vitals/Pain None to report    Home Living                      Prior Function            PT Goals (current goals can now be found in the care plan section) Acute Rehab PT Goals Patient Stated Goal: To decrease his pain PT Goal Formulation: With patient Time For Goal Achievement: 06/12/13 Potential to Achieve Goals: Good Progress towards PT goals: Progressing toward goals    Frequency  Min 3X/week    PT Plan Current plan remains appropriate    Co-evaluation             End of Session   Activity  Tolerance: Patient limited by lethargy Patient left: in bed;with bed alarm set;with nursing/sitter in room     Time: [id number] PT Time Calculation (min): 10 min  Charges:  $Therapeutic Activity: 8-22 mins                    G Codes:      Jolyn Lent 06/25/13, 9:56 AM  Jolyn Lent, PT, DPT Acute Rehabilitation Services Pager: [phone]

## 2013-06-08 NOTE — Progress Notes (Addendum)
CRITICAL VALUE ALERT  Critical value received:  Hemoglobin 6.6  Date of notification:  06/08/2013  Time of notification:  0400  Critical value read back:yes  Nurse who received alert:  Annice Pih, RN  MD notified (1st page):  Paged [facility] K. Black   Time of first page: 0402  MD notified (2nd page):  Time of second page:  Responding MD:  Lurlean Leyden  Time MD responded: [phone]

## 2013-06-09 LAB — CULTURE, BLOOD (ROUTINE X 2)
CULTURE: NO GROWTH
CULTURE: NO GROWTH

## 2013-06-09 LAB — GLUCOSE, CAPILLARY
Glucose-Capillary: 131 mg/dL — ABNORMAL HIGH (ref 70–99)
Glucose-Capillary: 149 mg/dL — ABNORMAL HIGH (ref 70–99)
Glucose-Capillary: 173 mg/dL — ABNORMAL HIGH (ref 70–99)
Glucose-Capillary: 228 mg/dL — ABNORMAL HIGH (ref 70–99)
Glucose-Capillary: 262 mg/dL — ABNORMAL HIGH (ref 70–99)

## 2013-06-09 LAB — CBC
HCT: 22.9 % — ABNORMAL LOW (ref 39.0–52.0)
Hemoglobin: 7.5 g/dL — ABNORMAL LOW (ref 13.0–17.0)
MCH: 28.2 pg (ref 26.0–34.0)
MCHC: 32.8 g/dL (ref 30.0–36.0)
MCV: 86.1 fL (ref 78.0–100.0)
Platelets: 338 10*3/uL (ref 150–400)
RBC: 2.66 MIL/uL — ABNORMAL LOW (ref 4.22–5.81)
RDW: 15.8 % — AB (ref 11.5–15.5)
WBC: 21.1 10*3/uL — ABNORMAL HIGH (ref 4.0–10.5)

## 2013-06-09 LAB — BASIC METABOLIC PANEL
BUN: 40 mg/dL — ABNORMAL HIGH (ref 6–23)
CO2: 27 mEq/L (ref 19–32)
Calcium: 7.9 mg/dL — ABNORMAL LOW (ref 8.4–10.5)
Chloride: 95 mEq/L — ABNORMAL LOW (ref 96–112)
Creatinine, Ser: 4.9 mg/dL — ABNORMAL HIGH (ref 0.50–1.35)
GFR calc non Af Amer: 13 mL/min — ABNORMAL LOW (ref >90)
GFR, EST AFRICAN AMERICAN: 15 mL/min — AB (ref >90)
Glucose, Bld: 254 mg/dL — ABNORMAL HIGH (ref 70–99)
POTASSIUM: 4.6 meq/L (ref 3.7–5.3)
Sodium: 136 mEq/L — ABNORMAL LOW (ref 137–147)

## 2013-06-09 LAB — ANAEROBIC CULTURE

## 2013-06-09 MED ORDER — INSULIN GLARGINE 100 UNIT/ML ~~LOC~~ SOLN
15.0000 [IU] | Freq: Every day | SUBCUTANEOUS | Status: DC
  Administered 2013-06-09 – 2013-06-20 (×12): 15 [IU] via SUBCUTANEOUS
  Filled 2013-06-09 (×14): qty 0.15

## 2013-06-09 MED ORDER — LIDOCAINE HCL (CARDIAC) 20 MG/ML IV SOLN
INTRAVENOUS | Status: AC
Start: 2013-06-09 — End: 2013-06-09
  Administered 2013-06-09: 100 mg
  Filled 2013-06-09: qty 5

## 2013-06-09 MED ORDER — THROMBIN 20000 UNITS EX SOLR
20000.0000 [IU] | Freq: Once | CUTANEOUS | Status: AC
  Administered 2013-06-09: 20000 [IU] via TOPICAL
  Filled 2013-06-09: qty 20000

## 2013-06-09 NOTE — Progress Notes (Signed)
Spoke to Dr Erlinda Hong about pt's LLE dressing.Very difficult removing previous dressing despite saturating site several times and removing small sections at a time. Bleeding noted at heel and midthigh. Small pinhole bleed noted.Pressure applied.Wet to dry dressing done.

## 2013-06-09 NOTE — Progress Notes (Signed)
[facility] ROUNDING NOTE   Subjective:   Interval History: beeding from Right Fasciotomy site   Objective:  Vital signs in last 24 hours:  Temp:  [97.3 F (36.3 C)-99.2 F (37.3 C)] 97.3 F (36.3 C) (05/16 0800) Pulse Rate:  [80-109] 101 (05/16 0542) Resp:  [12-26] 12 (05/16 1000) BP: (104-164)/(50-89) 138/56 mmHg (05/16 1000) SpO2:  [95 %-100 %] 96 % (05/16 1000) Weight:  [103.7 kg (228 lb 9.9 oz)-106.8 kg (235 lb 7.2 oz)] 103.7 kg (228 lb 9.9 oz) (05/16 0500)  Weight change:  Filed Weights   06/08/13 1330 06/08/13 1805 06/09/13 0500  Weight: 106.8 kg (235 lb 7.2 oz) 105.9 kg (233 lb 7.5 oz) 103.7 kg (228 lb 9.9 oz)    Intake/Output: I/O last 3 completed shifts: In: 2189.6 [P.O.:240; I.V.:1029.1; Blood:670.5; IV Piggyback:250] Out: [id number] [Urine:350; Other:3200]   Intake/Output this shift:     CVS- RRR  RS- CTA  ABD- BS present soft non-distended  EXT- no edema  RUE with significant edema extending from hand to shoulder;  RLE wrapped with bandage    Basic Metabolic Panel:  Recent Labs Lab 06/03/13 0725 06/04/13 0800 06/06/13 0659 06/08/13 0318 06/09/13 0252  NA 134* 134* 134* 133* 136*  K 4.6 4.6 4.5 4.7 4.6  CL 94* 91* 91* 93* 95*  CO2 24 21 23 23 27   GLUCOSE 211* 141* 203* 160* 254*  BUN 84* 102* 86* 74* 40*  CREATININE 6.69* 8.19* 7.47* 7.20* 4.90*  CALCIUM 8.7 8.7 8.5 8.1* 7.9*  MG 2.5  --   --   --   --   PHOS 6.7* 8.4* 8.8*  --   --     Liver Function Tests:  Recent Labs Lab 06/02/13 1423 06/03/13 0725 06/04/13 0800 06/06/13 0659  AST 15 13  --   --   ALT 17 14  --   --   ALKPHOS 166* 161*  --   --   BILITOT 0.5 0.6  --   --   PROT 8.3 7.6  --   --   ALBUMIN 1.9* 1.6* 1.5* 1.5*   No results found for this basename: LIPASE, AMYLASE,  in the last 168 hours No results found for this basename: AMMONIA,  in the last 168 hours  CBC:  Recent Labs Lab 06/06/13 0659 06/07/13 0500 06/07/13 1623 06/08/13 0318  06/09/13 0252  WBC 21.6* 23.6* 26.5* 30.4* 21.1*  HGB 8.0* 6.3* 7.1* 6.6* 7.5*  HCT 24.7* 19.9* 21.5* 20.3* 22.9*  MCV 82.6 84.7 82.1 82.9 86.1  PLT 284 330 334 381 338    Cardiac Enzymes:  Recent Labs Lab 06/03/13 0920 06/03/13 1515 06/07/13 1623 06/07/13 2045 06/08/13 0318  TROPONINI <0.30 <0.30 <0.30 <0.30 <0.30    BNP: No components found with this basename: POCBNP,   CBG:  Recent Labs Lab 06/08/13 0320 06/08/13 0811 06/08/13 1203 06/08/13 2112 06/08/13 2350  GLUCAP 152* 148* 135* 199* 173*    Microbiology: Results for orders placed during the hospital encounter of 06/02/13  CULTURE, BLOOD (ROUTINE X 2)     Status: None   Collection Time    06/02/13 10:50 PM      Result Value Ref Range Status   Specimen Description BLOOD LEFT ARM   Final   Special Requests BOTTLES DRAWN AEROBIC AND ANAEROBIC 10CC EACH   Final   Culture  Setup Time     Final   Value: 06/03/2013 12:30     Performed at [facility]  Culture     Final   Value:        BLOOD CULTURE RECEIVED NO GROWTH TO DATE CULTURE WILL BE HELD FOR 5 DAYS BEFORE ISSUING A FINAL NEGATIVE REPORT     Performed at [facility]   Report Status PENDING   Incomplete  CULTURE, BLOOD (ROUTINE X 2)     Status: None   Collection Time    06/02/13 10:55 PM      Result Value Ref Range Status   Specimen Description BLOOD LEFT HAND   Final   Special Requests BOTTLES DRAWN AEROBIC ONLY 10CC   Final   Culture  Setup Time     Final   Value: 06/03/2013 12:30     Performed at [facility]   Culture     Final   Value:        BLOOD CULTURE RECEIVED NO GROWTH TO DATE CULTURE WILL BE HELD FOR 5 DAYS BEFORE ISSUING A FINAL NEGATIVE REPORT     Performed at [facility]   Report Status PENDING   Incomplete  CULTURE, ROUTINE-ABSCESS     Status: None   Collection Time    06/03/13 12:20 AM      Result Value Ref Range Status   Specimen Description ABSCESS RIGHT FOOT   Final   Special Requests  ANKLE PATIENT ON FOLLOWING ZOSYN VANCOMYCIN   Final   Gram Stain     Final   Value: ABUNDANT WBC PRESENT, PREDOMINANTLY PMN     RARE SQUAMOUS EPITHELIAL CELLS PRESENT     ABUNDANT GRAM POSITIVE COCCI IN PAIRS     IN CLUSTERS     Performed at [facility]   Culture     Final   Value: MODERATE STAPHYLOCOCCUS AUREUS     Note: RIFAMPIN AND GENTAMICIN SHOULD NOT BE USED AS SINGLE DRUGS FOR TREATMENT OF STAPH INFECTIONS. This organism is presumed to be Clindamycin resistant based on detection of inducible Clindamycin resistance.     Performed at [facility]   Report Status 06/05/2013 FINAL   Final   Organism ID, Bacteria STAPHYLOCOCCUS AUREUS   Final  ANAEROBIC CULTURE     Status: None   Collection Time    06/03/13 12:20 AM      Result Value Ref Range Status   Specimen Description ABSCESS RIGHT FOOT   Final   Special Requests ANKLE PATIENT ON FOLLOWING ZOSYN VANCOMYCIN   Final   Gram Stain     Final   Value: ABUNDANT WBC PRESENT, PREDOMINANTLY PMN     RARE SQUAMOUS EPITHELIAL CELLS PRESENT     ABUNDANT GRAM POSITIVE COCCI IN PAIRS     IN CLUSTERS     Performed at [facility]   Culture     Final   Value: NO ANAEROBES ISOLATED     Performed at [facility]   Report Status 06/08/2013 FINAL   Final  MRSA PCR SCREENING     Status: None   Collection Time    06/03/13  3:35 AM      Result Value Ref Range Status   MRSA by PCR NEGATIVE  NEGATIVE Final   Comment:            The GeneXpert MRSA Assay (FDA     approved for NASAL specimens     only), is one component of a     comprehensive MRSA colonization     surveillance program. It is not  intended to diagnose MRSA     infection nor to guide or     monitor treatment for     MRSA infections.  CULTURE, ROUTINE-ABSCESS     Status: None   Collection Time    06/04/13  5:59 PM      Result Value Ref Range Status   Specimen Description ABSCESS ANKLE RIGHT   Final   Special Requests PATIENT ON FOLLOWING  VANCOMYCIN, UNASYN   Final   Gram Stain     Final   Value: ABUNDANT WBC PRESENT,BOTH PMN AND MONONUCLEAR     NO SQUAMOUS EPITHELIAL CELLS SEEN     FEW GRAM POSITIVE COCCI     IN PAIRS IN CLUSTERS     Performed at [facility]   Culture     Final   Value: MODERATE STAPHYLOCOCCUS AUREUS     Note: RIFAMPIN AND GENTAMICIN SHOULD NOT BE USED AS SINGLE DRUGS FOR TREATMENT OF STAPH INFECTIONS. This organism is presumed to be Clindamycin resistant based on detection of inducible Clindamycin resistance.     Performed at [facility]   Report Status 06/07/2013 FINAL   Final   Organism ID, Bacteria STAPHYLOCOCCUS AUREUS   Final  ANAEROBIC CULTURE     Status: None   Collection Time    06/04/13  5:59 PM      Result Value Ref Range Status   Specimen Description ABSCESS ANKLE RIGHT   Final   Special Requests PATIENT ON FOLLOWING VANCOMYCIN, UNASYN   Final   Gram Stain     Final   Value: ABUNDANT WBC PRESENT,BOTH PMN AND MONONUCLEAR     NO SQUAMOUS EPITHELIAL CELLS SEEN     FEW GRAM POSITIVE COCCI     IN PAIRS IN CLUSTERS     Performed at [facility]   Culture     Final   Value: NO ANAEROBES ISOLATED; CULTURE IN PROGRESS FOR 5 DAYS     Performed at [facility]   Report Status PENDING   Incomplete  ANAEROBIC CULTURE     Status: None   Collection Time    06/04/13  6:07 PM      Result Value Ref Range Status   Specimen Description ABSCESS RIGHT LEG   Final   Special Requests     Final   Value: PATIENT ON FOLLOWING VANCOMYCIN UNASYN CULTURES FROM RIGHT CALF   Gram Stain     Final   Value: FEW WBC PRESENT,BOTH PMN AND MONONUCLEAR     NO SQUAMOUS EPITHELIAL CELLS SEEN     FEW GRAM POSITIVE COCCI     IN PAIRS     Performed at [facility]   Culture     Final   Value: NO ANAEROBES ISOLATED; CULTURE IN PROGRESS FOR 5 DAYS     Performed at [facility]   Report Status PENDING   Incomplete  CULTURE, ROUTINE-ABSCESS     Status: None   Collection  Time    06/04/13  6:07 PM      Result Value Ref Range Status   Specimen Description ABSCESS RIGHT LEG   Final   Special Requests     Final   Value: PATIENT ON FOLLOWING VANCOMYCIN UNASYN CULTURES FROM RIGHT CALF   Gram Stain     Final   Value: RARE WBC PRESENT,BOTH PMN AND MONONUCLEAR     NO SQUAMOUS EPITHELIAL CELLS SEEN     FEW GRAM POSITIVE COCCI     IN [place]  Performed at [facility]     Final   Value: MODERATE STAPHYLOCOCCUS AUREUS     Note: SUSCEPTIBILITIES PERFORMED ON PREVIOUS CULTURE WITHIN THE LAST 5 DAYS.     Performed at [facility]   Report Status 06/07/2013 FINAL   Final    Coagulation Studies: No results found for this basename: LABPROT, INR,  in the last 72 hours  Urinalysis: No results found for this basename: COLORURINE, APPERANCEUR, LABSPEC, PHURINE, GLUCOSEU, HGBUR, BILIRUBINUR, KETONESUR, PROTEINUR, UROBILINOGEN, NITRITE, LEUKOCYTESUR,  in the last 72 hours    Imaging: No results found.   Medications:   . sodium chloride 10 mL/hr (06/06/13 1317)  . heparin Stopped (06/09/13 0010)   . docusate sodium  100 mg Oral BID  . feeding supplement (GLUCERNA SHAKE)  237 mL Oral TID BM  . insulin aspart  0-20 Units Subcutaneous TID WC  . insulin glargine  5 Units Subcutaneous QHS  . labetalol  200 mg Oral BID  . lidocaine (cardiac) 100 mg/26ml      . multivitamin  1 tablet Oral QHS  . piperacillin-tazobactam (ZOSYN)  IV  2.25 g Intravenous Q8H  . polyethylene glycol  17 g Oral Daily  . sevelamer carbonate  800 mg Oral TID WC  . thrombin  20,000 Units Topical Once  . vancomycin  1,000 mg Intravenous Q M,W,F-HD   acetaminophen, acetaminophen, bisacodyl, HYDROcodone-acetaminophen, HYDROmorphone (DILAUDID) injection, metoCLOPramide (REGLAN) injection, metoCLOPramide, morphine injection, ondansetron (ZOFRAN) IV, ondansetron, oxyCODONE-acetaminophen  Assessment/ Plan:  Intraoperative findings showed massive necrotizing fasciitis and  purulent abscess involving the entire right leg from the calcaneus to the popliteal fossa.  ESRD- Hemodialysis is schedule [facility]  ANEMIA- Hemoglobin > 7 after 2 units transfusion MBD- sevelemer  HTN/VOL- controlled at present  ACCESS- AVG no issues  OTHER- MRSA bacteremia Cultures + ve 5/8 Treated Vancomycin   LOS: 7 Sherril Croon @TODAY @11 :09 AM

## 2013-06-09 NOTE — Progress Notes (Signed)
Walden Field NP notified of continued bleeding from LLE Fasciotomy site. Informed her that pt is on Heparin gtt for RUE DVT. Heparin drip stopped per Walden Field

## 2013-06-09 NOTE — Progress Notes (Addendum)
Dr Nicki Reaper made aware of LLE dressing change x2 due to bleeding. Instructed to reinforce dressings.

## 2013-06-09 NOTE — Progress Notes (Signed)
[facility] TEAM 1 - Stepdown/ICU TEAM Progress Note  Steven Logan [id number] DOB: Nov 22, 1970 DOA: 06/02/2013 PCP: Tivis Ringer, MD  Admit HPI / Brief Narrative: 43 y.o. M w/ a history of Morbid obesity; Gout; HTN; Dyslipidemia; Pulmonary embolism; Pancreatitis; Arthritis; Depression; Diabetes mellitus; and chronic anemia who presented with right ankle swelling for 2 weeks.  At first it was diagnosed as gout and he was started on prednisone without improvement. Patient reported that he later bumped his leg and a wound opened up and started to ooze pus. Patient subsequently re-presented to the ER.  An MRI of the ankle revealed gas-containing abscess and cellulitis. Orthopedics took him emergently to the OR for debridement. Pt also reported right arm swelling for 2 days.    HPI/Subjective: Pt state pain is reasonably controlled.  Denies new complaints.  Has not noted signif change in swelling of RUE.   Assessment/Plan:  Severe sepsis due to septic ankle & abscess of right lower leg / necrotizing fasciitis  Hemodynamically stabilized - cont empiric antibiotics - requiring multiple trips to OR for I&D - cultures revealed MSSA from wound/op site - current coverage should be more than adequate   Acute nonocclusive DVT RUE jugular, subclavian, axillary, and brachial veins involved - avoid access/blood draws in R arm - cont IV Heparin - convert to oral when OK per surgery - recent removal of HD vascath from right IJ 5/10 (had been in place since Aug 2014)  Poor venous access  Encephalopathy, toxic resolved  Anemia in chronic kidney disease / acute postoperative blood loss anemia  Follow trend - did experience some bleeding from wound this morning necessitating temporary stop of heparin - transfuse as needed to keep Hgb 7.0 or >  End stage renal disease on HD M/W/F per Renal  Hypertension BP stable at present   DM / Hyperglycemia CBG not at goal - adjust tx and follow - A1c 7.0 -  does not appear to be on medical tx at home   Obesity - morbid - Body mass index is 30.17 kg/(m^2).  Code Status: FULL Family Communication: no family present at time of exam  Disposition Plan: SDU-PT/OT as recommended by orthopedic physician-if remains hemodynamically stable consider transferring over weekend   Consultants: Ortho Nephro  Procedures: I and D on 06/03/2013  IRRIGATION AND DEBRIDEMENT EXTREMITY extensive excision of muscle fascia and soft tissue right leg 06/06/13  Antibiotics: Vanc 5/9 > Zosyn 5/9 >  DVT prophylaxis: IV heparin   Objective: Blood pressure 126/47, pulse 98, temperature 97.9 F (36.6 C), temperature source Oral, resp. rate 20, height 6\' 1"  (1.854 m), weight 103.7 kg (228 lb 9.9 oz), SpO2 100.00%.  Intake/Output Summary (Last 24 hours) at 06/09/13 1733 Last data filed at 06/09/13 1406  Gross per 24 hour  Intake 1531.16 ml  Output   3550 ml  Net -2018.84 ml   Exam: General: No acute respiratory distress Lungs: Clear to auscultation bilaterally without wheezes or crackles Cardiovascular: Regular rate and rhythm without murmur gallop or rub  Abdomen: Nontender, soft, bowel sounds positive, no rebound, no ascites, no appreciable mass Extremities: RUE with stable edema extending from hand to shoulder; also edema RLE with bandage in place and currently dry   Data Reviewed: Basic Metabolic Panel:  Recent Labs Lab 06/03/13 0725 06/04/13 0800 06/06/13 0659 06/08/13 0318 06/09/13 0252  NA 134* 134* 134* 133* 136*  K 4.6 4.6 4.5 4.7 4.6  CL 94* 91* 91* 93* 95*  CO2 24 21 23  23  27  GLUCOSE 211* 141* 203* 160* 254*  BUN 84* 102* 86* 74* 40*  CREATININE 6.69* 8.19* 7.47* 7.20* 4.90*  CALCIUM 8.7 8.7 8.5 8.1* 7.9*  MG 2.5  --   --   --   --   PHOS 6.7* 8.4* 8.8*  --   --    Liver Function Tests:  Recent Labs Lab 06/03/13 0725 06/04/13 0800 06/06/13 0659  AST 13  --   --   ALT 14  --   --   ALKPHOS 161*  --   --   BILITOT 0.6  --    --   PROT 7.6  --   --   ALBUMIN 1.6* 1.5* 1.5*   CBC:  Recent Labs Lab 06/06/13 0659 06/07/13 0500 06/07/13 1623 06/08/13 0318 06/09/13 0252  WBC 21.6* 23.6* 26.5* 30.4* 21.1*  HGB 8.0* 6.3* 7.1* 6.6* 7.5*  HCT 24.7* 19.9* 21.5* 20.3* 22.9*  MCV 82.6 84.7 82.1 82.9 86.1  PLT 284 330 334 381 338   Cardiac Enzymes:  Recent Labs Lab 06/03/13 0920 06/03/13 1515 06/07/13 1623 06/07/13 2045 06/08/13 0318  TROPONINI <0.30 <0.30 <0.30 <0.30 <0.30   CBG:  Recent Labs Lab 06/08/13 2112 06/08/13 2350 06/09/13 0830 06/09/13 1206 06/09/13 1622  GLUCAP 199* 173* 262* 228* 149*    Recent Results (from the past 240 hour(s))  CULTURE, BLOOD (ROUTINE X 2)     Status: None   Collection Time    06/02/13 10:50 PM      Result Value Ref Range Status   Specimen Description BLOOD LEFT ARM   Final   Special Requests BOTTLES DRAWN AEROBIC AND ANAEROBIC 10CC EACH   Final   Culture  Setup Time     Final   Value: 06/03/2013 12:30     Performed at [facility]   Culture     Final   Value: NO GROWTH 5 DAYS     Performed at [facility]   Report Status 06/09/2013 FINAL   Final  CULTURE, BLOOD (ROUTINE X 2)     Status: None   Collection Time    06/02/13 10:55 PM      Result Value Ref Range Status   Specimen Description BLOOD LEFT HAND   Final   Special Requests BOTTLES DRAWN AEROBIC ONLY 10CC   Final   Culture  Setup Time     Final   Value: 06/03/2013 12:30     Performed at [facility]   Culture     Final   Value: NO GROWTH 5 DAYS     Performed at [facility]   Report Status 06/09/2013 FINAL   Final  CULTURE, ROUTINE-ABSCESS     Status: None   Collection Time    06/03/13 12:20 AM      Result Value Ref Range Status   Specimen Description ABSCESS RIGHT FOOT   Final   Special Requests ANKLE PATIENT ON FOLLOWING ZOSYN VANCOMYCIN   Final   Gram Stain     Final   Value: ABUNDANT WBC PRESENT, PREDOMINANTLY PMN     RARE SQUAMOUS EPITHELIAL CELLS  PRESENT     ABUNDANT GRAM POSITIVE COCCI IN PAIRS     IN CLUSTERS     Performed at [facility]   Culture     Final   Value: MODERATE STAPHYLOCOCCUS AUREUS     Note: RIFAMPIN AND GENTAMICIN SHOULD NOT BE USED AS SINGLE DRUGS FOR TREATMENT OF STAPH INFECTIONS. This organism is presumed  to be Clindamycin resistant based on detection of inducible Clindamycin resistance.     Performed at [facility]   Report Status 06/05/2013 FINAL   Final   Organism ID, Bacteria STAPHYLOCOCCUS AUREUS   Final  ANAEROBIC CULTURE     Status: None   Collection Time    06/03/13 12:20 AM      Result Value Ref Range Status   Specimen Description ABSCESS RIGHT FOOT   Final   Special Requests ANKLE PATIENT ON FOLLOWING ZOSYN VANCOMYCIN   Final   Gram Stain     Final   Value: ABUNDANT WBC PRESENT, PREDOMINANTLY PMN     RARE SQUAMOUS EPITHELIAL CELLS PRESENT     ABUNDANT GRAM POSITIVE COCCI IN PAIRS     IN CLUSTERS     Performed at [facility]   Culture     Final   Value: NO ANAEROBES ISOLATED     Performed at [facility]   Report Status 06/08/2013 FINAL   Final  MRSA PCR SCREENING     Status: None   Collection Time    06/03/13  3:35 AM      Result Value Ref Range Status   MRSA by PCR NEGATIVE  NEGATIVE Final   Comment:            The GeneXpert MRSA Assay (FDA     approved for NASAL specimens     only), is one component of a     comprehensive MRSA colonization     surveillance program. It is not     intended to diagnose MRSA     infection nor to guide or     monitor treatment for     MRSA infections.  CULTURE, ROUTINE-ABSCESS     Status: None   Collection Time    06/04/13  5:59 PM      Result Value Ref Range Status   Specimen Description ABSCESS ANKLE RIGHT   Final   Special Requests PATIENT ON FOLLOWING VANCOMYCIN, UNASYN   Final   Gram Stain     Final   Value: ABUNDANT WBC PRESENT,BOTH PMN AND MONONUCLEAR     NO SQUAMOUS EPITHELIAL CELLS SEEN     FEW GRAM  POSITIVE COCCI     IN PAIRS IN CLUSTERS     Performed at [facility]   Culture     Final   Value: MODERATE STAPHYLOCOCCUS AUREUS     Note: RIFAMPIN AND GENTAMICIN SHOULD NOT BE USED AS SINGLE DRUGS FOR TREATMENT OF STAPH INFECTIONS. This organism is presumed to be Clindamycin resistant based on detection of inducible Clindamycin resistance.     Performed at [facility]   Report Status 06/07/2013 FINAL   Final   Organism ID, Bacteria STAPHYLOCOCCUS AUREUS   Final  ANAEROBIC CULTURE     Status: None   Collection Time    06/04/13  5:59 PM      Result Value Ref Range Status   Specimen Description ABSCESS ANKLE RIGHT   Final   Special Requests PATIENT ON FOLLOWING VANCOMYCIN, UNASYN   Final   Gram Stain     Final   Value: ABUNDANT WBC PRESENT,BOTH PMN AND MONONUCLEAR     NO SQUAMOUS EPITHELIAL CELLS SEEN     FEW GRAM POSITIVE COCCI     IN PAIRS IN CLUSTERS     Performed at [facility]   Culture     Final   Value: NO ANAEROBES ISOLATED  Performed at [facility]   Report Status 06/09/2013 FINAL   Final  ANAEROBIC CULTURE     Status: None   Collection Time    06/04/13  6:07 PM      Result Value Ref Range Status   Specimen Description ABSCESS RIGHT LEG   Final   Special Requests     Final   Value: PATIENT ON FOLLOWING VANCOMYCIN UNASYN CULTURES FROM RIGHT CALF   Gram Stain     Final   Value: FEW WBC PRESENT,BOTH PMN AND MONONUCLEAR     NO SQUAMOUS EPITHELIAL CELLS SEEN     FEW GRAM POSITIVE COCCI     IN PAIRS     Performed at [facility]   Culture     Final   Value: NO ANAEROBES ISOLATED     Performed at [facility]   Report Status 06/09/2013 FINAL   Final  CULTURE, ROUTINE-ABSCESS     Status: None   Collection Time    06/04/13  6:07 PM      Result Value Ref Range Status   Specimen Description ABSCESS RIGHT LEG   Final   Special Requests     Final   Value: PATIENT ON FOLLOWING VANCOMYCIN UNASYN CULTURES FROM RIGHT CALF    Gram Stain     Final   Value: RARE WBC PRESENT,BOTH PMN AND MONONUCLEAR     NO SQUAMOUS EPITHELIAL CELLS SEEN     FEW GRAM POSITIVE COCCI     IN PAIRS     Performed at [facility]   Culture     Final   Value: MODERATE STAPHYLOCOCCUS AUREUS     Note: SUSCEPTIBILITIES PERFORMED ON PREVIOUS CULTURE WITHIN THE LAST 5 DAYS.     Performed at [facility]   Report Status 06/07/2013 FINAL   Final     Studies:  Recent x-ray studies have been reviewed in detail by the Attending Physician  Scheduled Meds:  Scheduled Meds: . docusate sodium  100 mg Oral BID  . feeding supplement (GLUCERNA SHAKE)  237 mL Oral TID BM  . insulin aspart  0-20 Units Subcutaneous TID WC  . insulin glargine  5 Units Subcutaneous QHS  . labetalol  200 mg Oral BID  . multivitamin  1 tablet Oral QHS  . piperacillin-tazobactam (ZOSYN)  IV  2.25 g Intravenous Q8H  . polyethylene glycol  17 g Oral Daily  . sevelamer carbonate  800 mg Oral TID WC  . vancomycin  1,000 mg Intravenous Q M,W,F-HD    Time spent on care of this patient: 35 mins  Cherene Altes, MD [facility] For Consults/Admissions - Flow Manager - [phone] Office  [phone] Pager [phone]  On-Call/Text Page:      [email or web address]      password [facility]  06/09/2013, 5:33 PM   LOS: 7 days

## 2013-06-09 NOTE — Progress Notes (Signed)
Dressing changed - thrombin applied Will need or dressing change next week because gauze in wound matted down -  Tissue distally marginal viability Ok to resume heparin at 1300 today

## 2013-06-10 LAB — HEPARIN LEVEL (UNFRACTIONATED)
HEPARIN UNFRACTIONATED: 0.22 [IU]/mL — AB (ref 0.30–0.70)
Heparin Unfractionated: 0.32 IU/mL (ref 0.30–0.70)

## 2013-06-10 LAB — CBC
HCT: 17.2 % — ABNORMAL LOW (ref 39.0–52.0)
HCT: 20.2 % — ABNORMAL LOW (ref 39.0–52.0)
HEMATOCRIT: 19.3 % — AB (ref 39.0–52.0)
HEMOGLOBIN: 5.6 g/dL — AB (ref 13.0–17.0)
HEMOGLOBIN: 6.6 g/dL — AB (ref 13.0–17.0)
HEMOGLOBIN: 6.4 g/dL — AB (ref 13.0–17.0)
MCH: 27.9 pg (ref 26.0–34.0)
MCH: 28.2 pg (ref 26.0–34.0)
MCH: 28.6 pg (ref 26.0–34.0)
MCHC: 32.6 g/dL (ref 30.0–36.0)
MCHC: 32.7 g/dL (ref 30.0–36.0)
MCHC: 33.2 g/dL (ref 30.0–36.0)
MCV: 85.6 fL (ref 78.0–100.0)
MCV: 86.3 fL (ref 78.0–100.0)
MCV: 86.2 fL (ref 78.0–100.0)
Platelets: 483 10*3/uL — ABNORMAL HIGH (ref 150–400)
Platelets: 513 10*3/uL — ABNORMAL HIGH (ref 150–400)
Platelets: 486 10*3/uL — ABNORMAL HIGH (ref 150–400)
RBC: 2.01 MIL/uL — AB (ref 4.22–5.81)
RBC: 2.34 MIL/uL — ABNORMAL LOW (ref 4.22–5.81)
RBC: 2.24 MIL/uL — AB (ref 4.22–5.81)
RDW: 16.1 % — ABNORMAL HIGH (ref 11.5–15.5)
RDW: 15.8 % — ABNORMAL HIGH (ref 11.5–15.5)
RDW: 16.1 % — ABNORMAL HIGH (ref 11.5–15.5)
WBC: 27.7 10*3/uL — AB (ref 4.0–10.5)
WBC: 25 10*3/uL — AB (ref 4.0–10.5)
WBC: 24.9 10*3/uL — ABNORMAL HIGH (ref 4.0–10.5)

## 2013-06-10 LAB — BASIC METABOLIC PANEL
BUN: 60 mg/dL — ABNORMAL HIGH (ref 6–23)
CALCIUM: 7.7 mg/dL — AB (ref 8.4–10.5)
CHLORIDE: 95 meq/L — AB (ref 96–112)
CO2: 22 meq/L (ref 19–32)
Creatinine, Ser: 7.46 mg/dL — ABNORMAL HIGH (ref 0.50–1.35)
GFR calc Af Amer: 9 mL/min — ABNORMAL LOW (ref >90)
GFR calc non Af Amer: 8 mL/min — ABNORMAL LOW (ref >90)
GLUCOSE: 136 mg/dL — AB (ref 70–99)
Potassium: 5.8 mEq/L — ABNORMAL HIGH (ref 3.7–5.3)
SODIUM: 135 meq/L — AB (ref 137–147)

## 2013-06-10 LAB — GLUCOSE, CAPILLARY
GLUCOSE-CAPILLARY: 142 mg/dL — AB (ref 70–99)
GLUCOSE-CAPILLARY: 127 mg/dL — AB (ref 70–99)
Glucose-Capillary: 141 mg/dL — ABNORMAL HIGH (ref 70–99)
Glucose-Capillary: 102 mg/dL — ABNORMAL HIGH (ref 70–99)

## 2013-06-10 LAB — PREPARE RBC (CROSSMATCH)

## 2013-06-10 MED ORDER — THROMBIN 20000 UNITS EX KIT
20000.0000 [IU] | PACK | Freq: Once | CUTANEOUS | Status: AC
Start: 2013-06-10 — End: 2013-06-10
  Administered 2013-06-10: 20000 [IU] via TOPICAL
  Filled 2013-06-10: qty 1

## 2013-06-10 MED ORDER — LIDOCAINE HCL (PF) 1 % IJ SOLN
INTRAMUSCULAR | Status: AC
  Filled 2013-06-10: qty 5

## 2013-06-10 MED ORDER — HEPARIN (PORCINE) IN NACL 100-0.45 UNIT/ML-% IJ SOLN
2550.0000 [IU]/h | INTRAMUSCULAR | Status: DC
  Administered 2013-06-10: 2550 [IU]/h via INTRAVENOUS
  Filled 2013-06-10 (×9): qty 250

## 2013-06-10 NOTE — Progress Notes (Addendum)
Pt continued to bleed a lot from his right lower leg (fasciotomy site). Orthopedic MD notified. Dressing changed by MD (see Dr. Marlou Sa note).

## 2013-06-10 NOTE — Progress Notes (Signed)
[facility] TEAM 1 - Stepdown/ICU TEAM Progress Note  Steven Logan [id number] DOB: [birth year] DOA: 06/02/2013 PCP: Tivis Ringer, MD  Admit HPI / Brief Narrative: 43 y.o. M w/ a history of Morbid obesity; Gout; HTN; Dyslipidemia; Pulmonary embolism; Pancreatitis; Arthritis; Depression; Diabetes mellitus; and chronic anemia who presented with right ankle swelling for 2 weeks.  At first it was diagnosed as gout and he was started on prednisone without improvement. Patient reported that he later bumped his leg and a wound opened up and started to ooze pus. Patient subsequently re-presented to the ER.  An MRI of the ankle revealed gas-containing abscess and cellulitis. Orthopedics took him emergently to the OR for debridement. Pt also reported right arm swelling for 2 days.    HPI/Subjective: Pt state pain is reasonably controlled.  Denies new complaints.  Has not noted signif change in swelling of RUE.   Assessment/Plan:  Severe sepsis due to septic ankle & abscess of right lower leg / necrotizing fasciitis  Hemodynamically stabilized - cont empiric antibiotics - requiring multiple trips to OR for I&D - cultures revealed MSSA from wound/op site - current coverage should be more than adequate - stop Vanc - cont Zosyn for now - narrow once clinically improving  Acute nonocclusive DVT RUE jugular, subclavian, axillary, and brachial veins involved - avoid access/blood draws in R arm - cont IV Heparin - convert to oral when OK per surgery - recent removal of HD vascath from right IJ 5/10 (had been in place since Aug 2014)  Anemia in chronic kidney disease / acute postoperative blood loss anemia  Follow trend - did experience extensive bleeding from wound 5/16 morning per RN report (soaked through dressing and was dripping on bed/floor) necessitating temporary stop of heparin - transfuse as needed to keep Hgb 7.0 or >  Poor venous access  Encephalopathy, toxic Mental status waxing and  waining - improved since this AM following transfusion of blood - follow   End stage renal disease on HD M/W/F per Renal  Hypertension BP stable at present   DM / Hyperglycemia CBG improved - follow w/o change today - A1c 7.0 - does not appear to be on medical tx at home   Obesity - morbid - Body mass index is 29.73 kg/(m^2).  Code Status: FULL Family Communication: no family present at time of exam  Disposition Plan: SDU  Consultants: Ortho Nephro  Procedures: I and D on 06/03/2013  IRRIGATION AND DEBRIDEMENT EXTREMITY extensive excision of muscle fascia and soft tissue right leg 06/06/13  Antibiotics: Vanc 5/9 >5/17 Zosyn 5/9 >  DVT prophylaxis: IV heparin   Objective: Blood pressure 116/62, pulse 79, temperature 98.2 F (36.8 C), temperature source Oral, resp. rate 13, height 6\' 1"  (1.854 m), weight 102.2 kg (225 lb 5 oz), SpO2 100.00%.  Intake/Output Summary (Last 24 hours) at 06/10/13 1604 Last data filed at 06/10/13 1431  Gross per 24 hour  Intake 1325.67 ml  Output      0 ml  Net 1325.67 ml   Exam: General: No acute respiratory distress Lungs: Clear to auscultation bilaterally without wheezes or crackles Cardiovascular: Regular rate and rhythm without murmur gallop or rub  Abdomen: Nontender, soft, bowel sounds positive, no rebound, no ascites, no appreciable mass Extremities: RUE with stable edema extending from hand to shoulder; RLE dressed and dry at time of exam today   Data Reviewed: Basic Metabolic Panel:  Recent Labs Lab 06/04/13 0800 06/06/13 [id number] 06/08/13 0318 06/09/13 0252 06/10/13 0505  NA 134* 134* 133* 136* 135*  K 4.6 4.5 4.7 4.6 5.8*  CL 91* 91* 93* 95* 95*  CO2 21 23 23 27 22   GLUCOSE 141* 203* 160* 254* 136*  BUN 102* 86* 74* 40* 60*  CREATININE 8.19* 7.47* 7.20* 4.90* 7.46*  CALCIUM 8.7 8.5 8.1* 7.9* 7.7*  PHOS 8.4* 8.8*  --   --   --    Liver Function Tests:  Recent Labs Lab 06/04/13 0800 06/06/13 0659  ALBUMIN 1.5*  1.5*   CBC:  Recent Labs Lab 06/07/13 1623 06/08/13 0318 06/09/13 0252 06/10/13 0505 06/10/13 1500  WBC 26.5* 30.4* 21.1* 27.7* 25.0*  HGB 7.1* 6.6* 7.5* 5.6* 6.6*  HCT 21.5* 20.3* 22.9* 17.2* 20.2*  MCV 82.1 82.9 86.1 85.6 86.3  PLT 334 381 338 483* 513*   Cardiac Enzymes:  Recent Labs Lab 06/07/13 1623 06/07/13 2045 06/08/13 0318  TROPONINI <0.30 <0.30 <0.30   CBG:  Recent Labs Lab 06/09/13 1622 06/09/13 2030 06/10/13 0236 06/10/13 0807 06/10/13 1203  GLUCAP 149* 131* 142* 141* 127*    Recent Results (from the past 240 hour(s))  CULTURE, BLOOD (ROUTINE X 2)     Status: None   Collection Time    06/02/13 10:50 PM      Result Value Ref Range Status   Specimen Description BLOOD LEFT ARM   Final   Special Requests BOTTLES DRAWN AEROBIC AND ANAEROBIC 10CC EACH   Final   Culture  Setup Time     Final   Value: 06/03/2013 12:30     Performed at [facility]   Culture     Final   Value: NO GROWTH 5 DAYS     Performed at [facility]   Report Status 06/09/2013 FINAL   Final  CULTURE, BLOOD (ROUTINE X 2)     Status: None   Collection Time    06/02/13 10:55 PM      Result Value Ref Range Status   Specimen Description BLOOD LEFT HAND   Final   Special Requests BOTTLES DRAWN AEROBIC ONLY 10CC   Final   Culture  Setup Time     Final   Value: 06/03/2013 12:30     Performed at [facility]   Culture     Final   Value: NO GROWTH 5 DAYS     Performed at [facility]   Report Status 06/09/2013 FINAL   Final  CULTURE, ROUTINE-ABSCESS     Status: None   Collection Time    06/03/13 12:20 AM      Result Value Ref Range Status   Specimen Description ABSCESS RIGHT FOOT   Final   Special Requests ANKLE PATIENT ON FOLLOWING ZOSYN VANCOMYCIN   Final   Gram Stain     Final   Value: ABUNDANT WBC PRESENT, PREDOMINANTLY PMN     RARE SQUAMOUS EPITHELIAL CELLS PRESENT     ABUNDANT GRAM POSITIVE COCCI IN PAIRS     IN CLUSTERS     Performed at  [facility]   Culture     Final   Value: MODERATE STAPHYLOCOCCUS AUREUS     Note: RIFAMPIN AND GENTAMICIN SHOULD NOT BE USED AS SINGLE DRUGS FOR TREATMENT OF STAPH INFECTIONS. This organism is presumed to be Clindamycin resistant based on detection of inducible Clindamycin resistance.     Performed at [facility]   Report Status 06/05/2013 FINAL   Final   Organism ID, Bacteria STAPHYLOCOCCUS AUREUS   Final  ANAEROBIC  CULTURE     Status: None   Collection Time    06/03/13 12:20 AM      Result Value Ref Range Status   Specimen Description ABSCESS RIGHT FOOT   Final   Special Requests ANKLE PATIENT ON FOLLOWING ZOSYN VANCOMYCIN   Final   Gram Stain     Final   Value: ABUNDANT WBC PRESENT, PREDOMINANTLY PMN     RARE SQUAMOUS EPITHELIAL CELLS PRESENT     ABUNDANT GRAM POSITIVE COCCI IN PAIRS     IN CLUSTERS     Performed at [facility]   Culture     Final   Value: NO ANAEROBES ISOLATED     Performed at [facility]   Report Status 06/08/2013 FINAL   Final  MRSA PCR SCREENING     Status: None   Collection Time    06/03/13  3:35 AM      Result Value Ref Range Status   MRSA by PCR NEGATIVE  NEGATIVE Final   Comment:            The GeneXpert MRSA Assay (FDA     approved for NASAL specimens     only), is one component of a     comprehensive MRSA colonization     surveillance program. It is not     intended to diagnose MRSA     infection nor to guide or     monitor treatment for     MRSA infections.  CULTURE, ROUTINE-ABSCESS     Status: None   Collection Time    06/04/13  5:59 PM      Result Value Ref Range Status   Specimen Description ABSCESS ANKLE RIGHT   Final   Special Requests PATIENT ON FOLLOWING VANCOMYCIN, UNASYN   Final   Gram Stain     Final   Value: ABUNDANT WBC PRESENT,BOTH PMN AND MONONUCLEAR     NO SQUAMOUS EPITHELIAL CELLS SEEN     FEW GRAM POSITIVE COCCI     IN PAIRS IN CLUSTERS     Performed at [facility]   Culture      Final   Value: MODERATE STAPHYLOCOCCUS AUREUS     Note: RIFAMPIN AND GENTAMICIN SHOULD NOT BE USED AS SINGLE DRUGS FOR TREATMENT OF STAPH INFECTIONS. This organism is presumed to be Clindamycin resistant based on detection of inducible Clindamycin resistance.     Performed at [facility]   Report Status 06/07/2013 FINAL   Final   Organism ID, Bacteria STAPHYLOCOCCUS AUREUS   Final  ANAEROBIC CULTURE     Status: None   Collection Time    06/04/13  5:59 PM      Result Value Ref Range Status   Specimen Description ABSCESS ANKLE RIGHT   Final   Special Requests PATIENT ON FOLLOWING VANCOMYCIN, UNASYN   Final   Gram Stain     Final   Value: ABUNDANT WBC PRESENT,BOTH PMN AND MONONUCLEAR     NO SQUAMOUS EPITHELIAL CELLS SEEN     FEW GRAM POSITIVE COCCI     IN PAIRS IN CLUSTERS     Performed at [facility]   Culture     Final   Value: NO ANAEROBES ISOLATED     Performed at [facility]   Report Status 06/09/2013 FINAL   Final  ANAEROBIC CULTURE     Status: None   Collection Time    06/04/13  6:07 PM      Result  Value Ref Range Status   Specimen Description ABSCESS RIGHT LEG   Final   Special Requests     Final   Value: PATIENT ON FOLLOWING VANCOMYCIN UNASYN CULTURES FROM RIGHT CALF   Gram Stain     Final   Value: FEW WBC PRESENT,BOTH PMN AND MONONUCLEAR     NO SQUAMOUS EPITHELIAL CELLS SEEN     FEW GRAM POSITIVE COCCI     IN PAIRS     Performed at [facility]   Culture     Final   Value: NO ANAEROBES ISOLATED     Performed at [facility]   Report Status 06/09/2013 FINAL   Final  CULTURE, ROUTINE-ABSCESS     Status: None   Collection Time    06/04/13  6:07 PM      Result Value Ref Range Status   Specimen Description ABSCESS RIGHT LEG   Final   Special Requests     Final   Value: PATIENT ON FOLLOWING VANCOMYCIN UNASYN CULTURES FROM RIGHT CALF   Gram Stain     Final   Value: RARE WBC PRESENT,BOTH PMN AND MONONUCLEAR     NO SQUAMOUS  EPITHELIAL CELLS SEEN     FEW GRAM POSITIVE COCCI     IN PAIRS     Performed at [facility]   Culture     Final   Value: MODERATE STAPHYLOCOCCUS AUREUS     Note: SUSCEPTIBILITIES PERFORMED ON PREVIOUS CULTURE WITHIN THE LAST 5 DAYS.     Performed at [facility]   Report Status 06/07/2013 FINAL   Final     Studies:  Recent x-ray studies have been reviewed in detail by the Attending Physician  Scheduled Meds:  Scheduled Meds: . docusate sodium  100 mg Oral BID  . feeding supplement (GLUCERNA SHAKE)  237 mL Oral TID BM  . insulin aspart  0-20 Units Subcutaneous TID WC  . insulin glargine  15 Units Subcutaneous QHS  . labetalol  200 mg Oral BID  . multivitamin  1 tablet Oral QHS  . piperacillin-tazobactam (ZOSYN)  IV  2.25 g Intravenous Q8H  . polyethylene glycol  17 g Oral Daily  . sevelamer carbonate  800 mg Oral TID WC    Time spent on care of this patient: 35 mins  Cherene Altes, MD [facility] For Consults/Admissions - Flow Manager - [phone] Office  [phone] Pager [phone]  On-Call/Text Page:      [email or web address]      password [facility]  06/10/2013, 4:04 PM   LOS: 8 days

## 2013-06-10 NOTE — Progress Notes (Signed)
Heparin order verified with MD. Received telephone order to continue heparin drip.

## 2013-06-10 NOTE — Progress Notes (Signed)
ANTICOAGULATION CONSULT NOTE - Follow Up Consult  Pharmacy Consult for heparin Indication: RUE DVT  No Known Allergies  Patient Measurements: Height: 6\' 1"  (185.4 cm) Weight: 228 lb 9.9 oz (103.7 kg) IBW/kg (Calculated) : 79.9 Heparin Dosing Weight: 103 kg  Vital Signs: Temp: 99.3 F (37.4 C) (05/16 2000) Temp src: Oral (05/16 2000) BP: 100/53 mmHg (05/16 2225) Pulse Rate: 101 (05/16 2225)  Labs:  Recent Labs  06/07/13 1623 06/07/13 2045 06/08/13 0318 06/08/13 2058 06/09/13 0252 06/09/13 2230  HGB 7.1*  --  6.6*  --  7.5*  --   HCT 21.5*  --  20.3*  --  22.9*  --   PLT 334  --  381  --  338  --   HEPARINUNFRC  --  0.14* 0.23* 0.11*  --  0.22*  CREATININE  --   --  7.20*  --  4.90*  --   TROPONINI <0.30 <0.30 <0.30  --   --   --     Estimated Creatinine Clearance: 24.6 ml/min (by C-G formula based on Cr of 4.9).  Assessment: [name] on heparin for RUE DVT.  S/p I&D 5/13.  Hgb 7.5 (s/p PRBC x 1 on 5/14, PRBC x3 on 5/15). Pt had bleeding from fasciotomy - thrombin was applied 5/16.  Heparin level 0.22 (subtherapeutic) on 2400 units/hr. RN has not changed dressing today so unsure if any bleeding under dressing.  Goal of Therapy:  Heparin level 0.3-0.5 units/ml Monitor platelets by anticoagulation protocol: Yes   Plan:  1) Increase heparin drip to 2550 units/hr - cautious adjustment due to recent bleeding at fasciotomy site. 2) Will f/u 6 hr heparin level  Thank you for allowing pharmacy to be a part of this patients care team.  Sherlon Handing, PharmD, BCPS Clinical pharmacist, pager [phone] 06/10/2013 1:15 AM

## 2013-06-10 NOTE — Progress Notes (Signed)
ANTICOAGULATION CONSULT NOTE - Follow Up Consult  Pharmacy Consult for heparin Indication: RUE DVT  No Known Allergies  Patient Measurements: Height: 6\' 1"  (185.4 cm) Weight: 225 lb 5 oz (102.2 kg) IBW/kg (Calculated) : 79.9 Heparin Dosing Weight: 103 kg  Vital Signs: Temp: 98.2 F (36.8 C) (05/17 1245) Temp src: Oral (05/17 1045) BP: 116/62 mmHg (05/17 1400) Pulse Rate: 79 (05/17 1400)  Labs:  Recent Labs  06/07/13 2045  06/08/13 0318 06/08/13 2058 06/09/13 0252 06/09/13 2230 06/10/13 0505 06/10/13 1500 06/10/13 1510  HGB  --   < > 6.6*  --  7.5*  --  5.6* 6.6*  --   HCT  --   < > 20.3*  --  22.9*  --  17.2* 20.2*  --   PLT  --   < > 381  --  338  --  483* 513*  --   HEPARINUNFRC 0.14*  --  0.23* 0.11*  --  0.22*  --   --  0.32  CREATININE  --   --  7.20*  --  4.90*  --  7.46*  --   --   TROPONINI <0.30  --  <0.30  --   --   --   --   --   --   < > = values in this interval not displayed.  Estimated Creatinine Clearance: 16 ml/min (by C-G formula based on Cr of 7.46).  Assessment: [name] on heparin for RUE DVT.  S/p I&D 5/13.  Hgb 6.6 s/p PRBC. Pt had bleeding from fasciotomy - thrombin was applied 5/16.  Heparin level now therapeutic at 0.32 on 2550 units/hr. MD wants to continue anticoagulation.   Goal of Therapy:  Heparin level 0.3-0.5 units/ml Monitor platelets by anticoagulation protocol: Yes   Plan:  1. Continue heparin 2550 units/hr 2. F/u AM heparin level to confirm dosing  Salome Arnt, PharmD, BCPS Pager # [phone] 06/10/2013 5:00 PM

## 2013-06-10 NOTE — Progress Notes (Signed)
[facility] ROUNDING NOTE   Subjective:   Interval History: beeding from Right Fasciotomy site. IV heparin DVT   Objective:  Vital signs in last 24 hours:  Temp:  [97.9 F (36.6 C)-101.9 F (38.8 C)] 98.3 F (36.8 C) (05/17 1045) Pulse Rate:  [89-103] 92 (05/17 1045) Resp:  [12-20] 15 (05/17 1045) BP: (93-141)/(41-114) 139/66 mmHg (05/17 1045) SpO2:  [96 %-100 %] 100 % (05/17 1045) Weight:  [102.2 kg (225 lb 5 oz)] 102.2 kg (225 lb 5 oz) (05/17 0400)  Weight change: -4.6 kg (-10 lb 2.3 oz) Filed Weights   06/08/13 1805 06/09/13 0500 06/10/13 0400  Weight: 105.9 kg (233 lb 7.5 oz) 103.7 kg (228 lb 9.9 oz) 102.2 kg (225 lb 5 oz)    Intake/Output: I/O last 3 completed shifts: In: 1557.2 [P.O.:590; I.V.:717.2; IV Piggyback:250] Out: 350 [Urine:350]   Intake/Output this shift:  Total I/O In: 326.7 [Blood:326.7] Out: -   CVS- RRR  RS- CTA  ABD- BS present soft non-distended  EXT- no edema  RUE with significant edema extending from hand to shoulder;  RLE wrapped with bandage    Basic Metabolic Panel:  Recent Labs Lab 06/04/13 0800 06/06/13 0659 06/08/13 0318 06/09/13 0252 06/10/13 0505  NA 134* 134* 133* 136* 135*  K 4.6 4.5 4.7 4.6 5.8*  CL 91* 91* 93* 95* 95*  CO2 21 23 23 27 22   GLUCOSE 141* 203* 160* 254* 136*  BUN 102* 86* 74* 40* 60*  CREATININE 8.19* 7.47* 7.20* 4.90* 7.46*  CALCIUM 8.7 8.5 8.1* 7.9* 7.7*  PHOS 8.4* 8.8*  --   --   --     Liver Function Tests:  Recent Labs Lab 06/04/13 0800 06/06/13 0659  ALBUMIN 1.5* 1.5*   No results found for this basename: LIPASE, AMYLASE,  in the last 168 hours No results found for this basename: AMMONIA,  in the last 168 hours  CBC:  Recent Labs Lab 06/07/13 0500 06/07/13 1623 06/08/13 0318 06/09/13 0252 06/10/13 0505  WBC 23.6* 26.5* 30.4* 21.1* 27.7*  HGB 6.3* 7.1* 6.6* 7.5* 5.6*  HCT 19.9* 21.5* 20.3* 22.9* 17.2*  MCV 84.7 82.1 82.9 86.1 85.6  PLT 330 334 381 338 483*     Cardiac Enzymes:  Recent Labs Lab 06/03/13 1515 06/07/13 1623 06/07/13 2045 06/08/13 0318  TROPONINI <0.30 <0.30 <0.30 <0.30    BNP: No components found with this basename: POCBNP,   CBG:  Recent Labs Lab 06/09/13 1206 06/09/13 1622 06/09/13 2030 06/10/13 0236 06/10/13 0807  GLUCAP 228* 149* 131* 142* 141*    Microbiology: Results for orders placed during the hospital encounter of 06/02/13  CULTURE, BLOOD (ROUTINE X 2)     Status: None   Collection Time    06/02/13 10:50 PM      Result Value Ref Range Status   Specimen Description BLOOD LEFT ARM   Final   Special Requests BOTTLES DRAWN AEROBIC AND ANAEROBIC 10CC EACH   Final   Culture  Setup Time     Final   Value: 06/03/2013 12:30     Performed at [facility]   Culture     Final   Value: NO GROWTH 5 DAYS     Performed at [facility]   Report Status 06/09/2013 FINAL   Final  CULTURE, BLOOD (ROUTINE X 2)     Status: None   Collection Time    06/02/13 10:55 PM      Result Value Ref Range Status  Specimen Description BLOOD LEFT HAND   Final   Special Requests BOTTLES DRAWN AEROBIC ONLY 10CC   Final   Culture  Setup Time     Final   Value: 06/03/2013 12:30     Performed at [facility]   Culture     Final   Value: NO GROWTH 5 DAYS     Performed at [facility]   Report Status 06/09/2013 FINAL   Final  CULTURE, ROUTINE-ABSCESS     Status: None   Collection Time    06/03/13 12:20 AM      Result Value Ref Range Status   Specimen Description ABSCESS RIGHT FOOT   Final   Special Requests ANKLE PATIENT ON FOLLOWING ZOSYN VANCOMYCIN   Final   Gram Stain     Final   Value: ABUNDANT WBC PRESENT, PREDOMINANTLY PMN     RARE SQUAMOUS EPITHELIAL CELLS PRESENT     ABUNDANT GRAM POSITIVE COCCI IN PAIRS     IN CLUSTERS     Performed at [facility]   Culture     Final   Value: MODERATE STAPHYLOCOCCUS AUREUS     Note: RIFAMPIN AND GENTAMICIN SHOULD NOT BE USED AS SINGLE  DRUGS FOR TREATMENT OF STAPH INFECTIONS. This organism is presumed to be Clindamycin resistant based on detection of inducible Clindamycin resistance.     Performed at [facility]   Report Status 06/05/2013 FINAL   Final   Organism ID, Bacteria STAPHYLOCOCCUS AUREUS   Final  ANAEROBIC CULTURE     Status: None   Collection Time    06/03/13 12:20 AM      Result Value Ref Range Status   Specimen Description ABSCESS RIGHT FOOT   Final   Special Requests ANKLE PATIENT ON FOLLOWING ZOSYN VANCOMYCIN   Final   Gram Stain     Final   Value: ABUNDANT WBC PRESENT, PREDOMINANTLY PMN     RARE SQUAMOUS EPITHELIAL CELLS PRESENT     ABUNDANT GRAM POSITIVE COCCI IN PAIRS     IN CLUSTERS     Performed at [facility]   Culture     Final   Value: NO ANAEROBES ISOLATED     Performed at [facility]   Report Status 06/08/2013 FINAL   Final  MRSA PCR SCREENING     Status: None   Collection Time    06/03/13  3:35 AM      Result Value Ref Range Status   MRSA by PCR NEGATIVE  NEGATIVE Final   Comment:            The GeneXpert MRSA Assay (FDA     approved for NASAL specimens     only), is one component of a     comprehensive MRSA colonization     surveillance program. It is not     intended to diagnose MRSA     infection nor to guide or     monitor treatment for     MRSA infections.  CULTURE, ROUTINE-ABSCESS     Status: None   Collection Time    06/04/13  5:59 PM      Result Value Ref Range Status   Specimen Description ABSCESS ANKLE RIGHT   Final   Special Requests PATIENT ON FOLLOWING VANCOMYCIN, UNASYN   Final   Gram Stain     Final   Value: ABUNDANT WBC PRESENT,BOTH PMN AND MONONUCLEAR     NO SQUAMOUS EPITHELIAL CELLS SEEN  FEW GRAM POSITIVE COCCI     IN PAIRS IN CLUSTERS     Performed at [facility]   Culture     Final   Value: MODERATE STAPHYLOCOCCUS AUREUS     Note: RIFAMPIN AND GENTAMICIN SHOULD NOT BE USED AS SINGLE DRUGS FOR TREATMENT OF STAPH  INFECTIONS. This organism is presumed to be Clindamycin resistant based on detection of inducible Clindamycin resistance.     Performed at [facility]   Report Status 06/07/2013 FINAL   Final   Organism ID, Bacteria STAPHYLOCOCCUS AUREUS   Final  ANAEROBIC CULTURE     Status: None   Collection Time    06/04/13  5:59 PM      Result Value Ref Range Status   Specimen Description ABSCESS ANKLE RIGHT   Final   Special Requests PATIENT ON FOLLOWING VANCOMYCIN, UNASYN   Final   Gram Stain     Final   Value: ABUNDANT WBC PRESENT,BOTH PMN AND MONONUCLEAR     NO SQUAMOUS EPITHELIAL CELLS SEEN     FEW GRAM POSITIVE COCCI     IN PAIRS IN CLUSTERS     Performed at [facility]   Culture     Final   Value: NO ANAEROBES ISOLATED     Performed at [facility]   Report Status 06/09/2013 FINAL   Final  ANAEROBIC CULTURE     Status: None   Collection Time    06/04/13  6:07 PM      Result Value Ref Range Status   Specimen Description ABSCESS RIGHT LEG   Final   Special Requests     Final   Value: PATIENT ON FOLLOWING VANCOMYCIN UNASYN CULTURES FROM RIGHT CALF   Gram Stain     Final   Value: FEW WBC PRESENT,BOTH PMN AND MONONUCLEAR     NO SQUAMOUS EPITHELIAL CELLS SEEN     FEW GRAM POSITIVE COCCI     IN PAIRS     Performed at [facility]   Culture     Final   Value: NO ANAEROBES ISOLATED     Performed at [facility]   Report Status 06/09/2013 FINAL   Final  CULTURE, ROUTINE-ABSCESS     Status: None   Collection Time    06/04/13  6:07 PM      Result Value Ref Range Status   Specimen Description ABSCESS RIGHT LEG   Final   Special Requests     Final   Value: PATIENT ON FOLLOWING VANCOMYCIN UNASYN CULTURES FROM RIGHT CALF   Gram Stain     Final   Value: RARE WBC PRESENT,BOTH PMN AND MONONUCLEAR     NO SQUAMOUS EPITHELIAL CELLS SEEN     FEW GRAM POSITIVE COCCI     IN PAIRS     Performed at [facility]   Culture     Final   Value: MODERATE  STAPHYLOCOCCUS AUREUS     Note: SUSCEPTIBILITIES PERFORMED ON PREVIOUS CULTURE WITHIN THE LAST 5 DAYS.     Performed at [facility]   Report Status 06/07/2013 FINAL   Final    Coagulation Studies: No results found for this basename: LABPROT, INR,  in the last 72 hours  Urinalysis: No results found for this basename: COLORURINE, APPERANCEUR, LABSPEC, PHURINE, GLUCOSEU, HGBUR, BILIRUBINUR, KETONESUR, PROTEINUR, UROBILINOGEN, NITRITE, LEUKOCYTESUR,  in the last 72 hours    Imaging: No results found.   Medications:   . sodium chloride 10 mL/hr at  06/09/13 2200  . heparin 2,550 Units/hr (06/10/13 0815)   . docusate sodium  100 mg Oral BID  . feeding supplement (GLUCERNA SHAKE)  237 mL Oral TID BM  . insulin aspart  0-20 Units Subcutaneous TID WC  . insulin glargine  15 Units Subcutaneous QHS  . labetalol  200 mg Oral BID  . multivitamin  1 tablet Oral QHS  . piperacillin-tazobactam (ZOSYN)  IV  2.25 g Intravenous Q8H  . polyethylene glycol  17 g Oral Daily  . sevelamer carbonate  800 mg Oral TID WC  . vancomycin  1,000 mg Intravenous Q M,W,F-HD   acetaminophen, acetaminophen, bisacodyl, HYDROcodone-acetaminophen, HYDROmorphone (DILAUDID) injection, metoCLOPramide (REGLAN) injection, metoCLOPramide, morphine injection, ondansetron (ZOFRAN) IV, ondansetron, oxyCODONE-acetaminophen  Assessment/ Plan:  Intraoperative findings showed massive necrotizing fasciitis and purulent abscess involving the entire right leg from the calcaneus to the popliteal fossa.  ESRD- Hemod[facility] is schedule [facility]  ANEMIA- Hemoglobin > 7 after 2 units transfusion  MBD- sevelemer  HTN/VOL- controlled at present  ACCESS- AVG no issues  OTHER- MRSA bacteremia Cultures + ve 5/8 Treated Vancomycin Right upper extremity DVT  Treated with IV Heparin   LOS: [place] @TODAY @11 :31 AM

## 2013-06-10 NOTE — Progress Notes (Signed)
ANTIBIOTIC CONSULT NOTE - FOLLOW UP  Pharmacy Consult for Zosyn Indication: septic ankle and abscess RLE, necrotizing faciitis  No Known Allergies  Patient Measurements: Height: 6\' 1"  (185.4 cm) Weight: 225 lb 5 oz (102.2 kg) IBW/kg (Calculated) : 79.9  Vital Signs: Temp: 98.2 F (36.8 C) (05/17 1245) Temp src: Oral (05/17 1045) BP: 116/62 mmHg (05/17 1400) Pulse Rate: 79 (05/17 1400) Intake/Output from previous day: 05/16 0701 - 05/17 0700 In: 1244 [P.O.:590; I.V.:454; IV Piggyback:200] Out: -  Intake/Output from this shift: Total I/O In: 771.7 [P.O.:120; Blood:651.7] Out: -   Labs:  Recent Labs  06/08/13 0318 06/09/13 0252 06/10/13 0505  WBC 30.4* 21.1* 27.7*  HGB 6.6* 7.5* 5.6*  PLT 381 338 483*  CREATININE 7.20* 4.90* 7.46*   Estimated Creatinine Clearance: 16 ml/min (by C-G formula based on Cr of 7.46). No results found for this basename: VANCOTROUGH, VANCOPEAK, VANCORANDOM, GENTTROUGH, GENTPEAK, GENTRANDOM, TOBRATROUGH, TOBRAPEAK, TOBRARND, AMIKACINPEAK, AMIKACINTROU, AMIKACIN,  in the last 72 hours   Microbiology: Recent Results (from the past 720 hour(s))  CULTURE, BLOOD (ROUTINE X 2)     Status: None   Collection Time    06/02/13 10:50 PM      Result Value Ref Range Status   Specimen Description BLOOD LEFT ARM   Final   Special Requests BOTTLES DRAWN AEROBIC AND ANAEROBIC 10CC EACH   Final   Culture  Setup Time     Final   Value: 06/03/2013 12:30     Performed at [facility]   Culture     Final   Value: NO GROWTH 5 DAYS     Performed at [facility]   Report Status 06/09/2013 FINAL   Final  CULTURE, BLOOD (ROUTINE X 2)     Status: None   Collection Time    06/02/13 10:55 PM      Result Value Ref Range Status   Specimen Description BLOOD LEFT HAND   Final   Special Requests BOTTLES DRAWN AEROBIC ONLY 10CC   Final   Culture  Setup Time     Final   Value: 06/03/2013 12:30     Performed at [facility]   Culture     Final    Value: NO GROWTH 5 DAYS     Performed at [facility]   Report Status 06/09/2013 FINAL   Final  CULTURE, ROUTINE-ABSCESS     Status: None   Collection Time    06/03/13 12:20 AM      Result Value Ref Range Status   Specimen Description ABSCESS RIGHT FOOT   Final   Special Requests ANKLE PATIENT ON FOLLOWING ZOSYN VANCOMYCIN   Final   Gram Stain     Final   Value: ABUNDANT WBC PRESENT, PREDOMINANTLY PMN     RARE SQUAMOUS EPITHELIAL CELLS PRESENT     ABUNDANT GRAM POSITIVE COCCI IN PAIRS     IN CLUSTERS     Performed at [facility]   Culture     Final   Value: MODERATE STAPHYLOCOCCUS AUREUS     Note: RIFAMPIN AND GENTAMICIN SHOULD NOT BE USED AS SINGLE DRUGS FOR TREATMENT OF STAPH INFECTIONS. This organism is presumed to be Clindamycin resistant based on detection of inducible Clindamycin resistance.     Performed at [facility]   Report Status 06/05/2013 FINAL   Final   Organism ID, Bacteria STAPHYLOCOCCUS AUREUS   Final  ANAEROBIC CULTURE     Status: None   Collection Time  06/03/13 12:20 AM      Result Value Ref Range Status   Specimen Description ABSCESS RIGHT FOOT   Final   Special Requests ANKLE PATIENT ON FOLLOWING ZOSYN VANCOMYCIN   Final   Gram Stain     Final   Value: ABUNDANT WBC PRESENT, PREDOMINANTLY PMN     RARE SQUAMOUS EPITHELIAL CELLS PRESENT     ABUNDANT GRAM POSITIVE COCCI IN PAIRS     IN CLUSTERS     Performed at [facility]   Culture     Final   Value: NO ANAEROBES ISOLATED     Performed at [facility]   Report Status 06/08/2013 FINAL   Final  MRSA PCR SCREENING     Status: None   Collection Time    06/03/13  3:35 AM      Result Value Ref Range Status   MRSA by PCR NEGATIVE  NEGATIVE Final   Comment:            The GeneXpert MRSA Assay (FDA     approved for NASAL specimens     only), is one component of a     comprehensive MRSA colonization     surveillance program. It is not     intended to diagnose  MRSA     infection nor to guide or     monitor treatment for     MRSA infections.  CULTURE, ROUTINE-ABSCESS     Status: None   Collection Time    06/04/13  5:59 PM      Result Value Ref Range Status   Specimen Description ABSCESS ANKLE RIGHT   Final   Special Requests PATIENT ON FOLLOWING VANCOMYCIN, UNASYN   Final   Gram Stain     Final   Value: ABUNDANT WBC PRESENT,BOTH PMN AND MONONUCLEAR     NO SQUAMOUS EPITHELIAL CELLS SEEN     FEW GRAM POSITIVE COCCI     IN PAIRS IN CLUSTERS     Performed at [facility]   Culture     Final   Value: MODERATE STAPHYLOCOCCUS AUREUS     Note: RIFAMPIN AND GENTAMICIN SHOULD NOT BE USED AS SINGLE DRUGS FOR TREATMENT OF STAPH INFECTIONS. This organism is presumed to be Clindamycin resistant based on detection of inducible Clindamycin resistance.     Performed at [facility]   Report Status 06/07/2013 FINAL   Final   Organism ID, Bacteria STAPHYLOCOCCUS AUREUS   Final  ANAEROBIC CULTURE     Status: None   Collection Time    06/04/13  5:59 PM      Result Value Ref Range Status   Specimen Description ABSCESS ANKLE RIGHT   Final   Special Requests PATIENT ON FOLLOWING VANCOMYCIN, UNASYN   Final   Gram Stain     Final   Value: ABUNDANT WBC PRESENT,BOTH PMN AND MONONUCLEAR     NO SQUAMOUS EPITHELIAL CELLS SEEN     FEW GRAM POSITIVE COCCI     IN PAIRS IN CLUSTERS     Performed at [facility]   Culture     Final   Value: NO ANAEROBES ISOLATED     Performed at [facility]   Report Status 06/09/2013 FINAL   Final  ANAEROBIC CULTURE     Status: None   Collection Time    06/04/13  6:07 PM      Result Value Ref Range Status   Specimen Description ABSCESS RIGHT LEG   Final  Special Requests     Final   Value: PATIENT ON FOLLOWING VANCOMYCIN UNASYN CULTURES FROM RIGHT CALF   Gram Stain     Final   Value: FEW WBC PRESENT,BOTH PMN AND MONONUCLEAR     NO SQUAMOUS EPITHELIAL CELLS SEEN     FEW GRAM POSITIVE COCCI      IN PAIRS     Performed at [facility]   Culture     Final   Value: NO ANAEROBES ISOLATED     Performed at [facility]   Report Status 06/09/2013 FINAL   Final  CULTURE, ROUTINE-ABSCESS     Status: None   Collection Time    06/04/13  6:07 PM      Result Value Ref Range Status   Specimen Description ABSCESS RIGHT LEG   Final   Special Requests     Final   Value: PATIENT ON FOLLOWING VANCOMYCIN UNASYN CULTURES FROM RIGHT CALF   Gram Stain     Final   Value: RARE WBC PRESENT,BOTH PMN AND MONONUCLEAR     NO SQUAMOUS EPITHELIAL CELLS SEEN     FEW GRAM POSITIVE COCCI     IN PAIRS     Performed at [facility]   Culture     Final   Value: MODERATE STAPHYLOCOCCUS AUREUS     Note: SUSCEPTIBILITIES PERFORMED ON PREVIOUS CULTURE WITHIN THE LAST 5 DAYS.     Performed at [facility]   Report Status 06/07/2013 FINAL   Final    Anti-infectives   Start     Dose/Rate Route Frequency Ordered Stop   06/05/13 0600  ceFAZolin (ANCEF) IVPB 2 g/50 mL premix  Status:  Discontinued     2 g 100 mL/hr over 30 Minutes Intravenous On call to O.R. 06/04/13 1301 06/04/13 1858   06/05/13 0000  vancomycin (VANCOCIN) 1,500 mg in sodium chloride 0.9 % 500 mL IVPB  Status:  Discontinued     1,500 mg 250 mL/hr over 120 Minutes Intravenous Every 48 hours 06/03/13 0239 06/03/13 0243   06/04/13 1200  vancomycin (VANCOCIN) IVPB 1000 mg/200 mL premix  Status:  Discontinued     1,000 mg 200 mL/hr over 60 Minutes Intravenous Every M-W-F (Hemodialysis) 06/03/13 0244 06/10/13 1415   06/03/13 0600  piperacillin-tazobactam (ZOSYN) IVPB 3.375 g  Status:  Discontinued     3.375 g 12.5 mL/hr over 240 Minutes Intravenous Every 8 hours 06/03/13 0239 06/03/13 0241   06/03/13 0600  piperacillin-tazobactam (ZOSYN) IVPB 2.25 g     2.25 g 100 mL/hr over 30 Minutes Intravenous Every 8 hours 06/03/13 0243     06/03/13 0400  vancomycin (VANCOCIN) IVPB 1000 mg/200 mL premix     1,000 mg 200 mL/hr  over 60 Minutes Intravenous  Once 06/03/13 0239 06/03/13 0551   06/02/13 2245  [MAR Hold]  vancomycin (VANCOCIN) 2,000 mg in sodium chloride 0.9 % 500 mL IVPB  Status:  Discontinued     (On MAR Hold since 06/02/13 2355)   2,000 mg 250 mL/hr over 120 Minutes Intravenous  Once 06/02/13 2235 06/03/13 0237   06/02/13 2245  piperacillin-tazobactam (ZOSYN) IVPB 3.375 g     3.375 g 100 mL/hr over 30 Minutes Intravenous  Once 06/02/13 2235 06/08/13 0817      Assessment: 43 yo M with ESRD/HD originally thought he had gout of the ankle.  After several weeks of swelling, the patient struck his ankle on an object which then broke open and drained.  He is  now s/p OR 5/10 for emergent I&D of gangrenous leg, repeat I&D on 5/11 (revealed "massive necrotizing fasciitis and purulent abscess entire leg), and OR again on 5/13 for debridement.  He is likely to return to OR this week.  Pharmacy was originally consulted to start vancomycin and Zoysn.  Abscess cultures have returned MSSA, and antibiotics are currently narrowed to Zosyn monotherapy.  Patient is afebrile, but WBC is up to 27.7.   vancomycin 5/10 >> 5/17 Zosyn 5/10>>  5/17 bld >> pending 5/11 anaerobic Right leg abscess>> few GPC in pairs/clusters, final 5/11 anaerobic right ankle abscess>> few GPC in pairs/clusters, final 5/11 right ankle abscess>> MSSA FINAL 5/11 right leg abscess>> moderate staph FINAL 5/10 right foot abscess>>MSSA FINAL 5/10 MRSA pcr neg 05/09 blood cx x2 >> ng final  Goal of Therapy:  Resolution of infection  Plan:  - continue Zosyn IV 2.25g q8h (traditional infusion) - monitor kidney function, WBC, temperature curve, cultures, and clinical progression  Ovid Curd E. Jacqlyn Larsen, PharmD Clinical Pharmacist - Resident Pager: [phone] Pharmacy: [phone] 06/10/2013 2:43 PM

## 2013-06-10 NOTE — Progress Notes (Signed)
Dressing dry this am Will need dressing change next week under anesthesia Transfused last pm

## 2013-06-11 LAB — BASIC METABOLIC PANEL
BUN: 83 mg/dL — AB (ref 6–23)
BUN: 41 mg/dL — AB (ref 6–23)
CALCIUM: 7.5 mg/dL — AB (ref 8.4–10.5)
CALCIUM: 8 mg/dL — AB (ref 8.4–10.5)
CO2: 20 meq/L (ref 19–32)
CO2: 25 mEq/L (ref 19–32)
CREATININE: 9.41 mg/dL — AB (ref 0.50–1.35)
CREATININE: 5.53 mg/dL — AB (ref 0.50–1.35)
Chloride: 92 mEq/L — ABNORMAL LOW (ref 96–112)
Chloride: 94 mEq/L — ABNORMAL LOW (ref 96–112)
GFR calc Af Amer: 7 mL/min — ABNORMAL LOW (ref >90)
GFR calc non Af Amer: 6 mL/min — ABNORMAL LOW (ref >90)
GFR, EST AFRICAN AMERICAN: 13 mL/min — AB (ref >90)
GFR, EST NON AFRICAN AMERICAN: 11 mL/min — AB (ref >90)
Glucose, Bld: 165 mg/dL — ABNORMAL HIGH (ref 70–99)
Glucose, Bld: 126 mg/dL — ABNORMAL HIGH (ref 70–99)
Potassium: 7 mEq/L (ref 3.7–5.3)
Potassium: 4.9 mEq/L (ref 3.7–5.3)
Sodium: 136 mEq/L — ABNORMAL LOW (ref 137–147)
Sodium: 137 mEq/L (ref 137–147)

## 2013-06-11 LAB — CBC
HEMATOCRIT: 21.9 % — AB (ref 39.0–52.0)
Hemoglobin: 7.4 g/dL — ABNORMAL LOW (ref 13.0–17.0)
MCH: 28.4 pg (ref 26.0–34.0)
MCHC: 33.8 g/dL (ref 30.0–36.0)
MCV: 83.9 fL (ref 78.0–100.0)
Platelets: 511 10*3/uL — ABNORMAL HIGH (ref 150–400)
RBC: 2.61 MIL/uL — AB (ref 4.22–5.81)
RDW: 15.5 % (ref 11.5–15.5)
WBC: 24.1 10*3/uL — ABNORMAL HIGH (ref 4.0–10.5)

## 2013-06-11 LAB — GLUCOSE, CAPILLARY
GLUCOSE-CAPILLARY: 117 mg/dL — AB (ref 70–99)
GLUCOSE-CAPILLARY: 189 mg/dL — AB (ref 70–99)
Glucose-Capillary: 113 mg/dL — ABNORMAL HIGH (ref 70–99)
Glucose-Capillary: 119 mg/dL — ABNORMAL HIGH (ref 70–99)
Glucose-Capillary: 140 mg/dL — ABNORMAL HIGH (ref 70–99)

## 2013-06-11 LAB — PREPARE RBC (CROSSMATCH)

## 2013-06-11 LAB — POTASSIUM: POTASSIUM: 4.1 meq/L (ref 3.7–5.3)

## 2013-06-11 MED ORDER — SODIUM CHLORIDE 0.9 % IV BOLUS (SEPSIS)
250.0000 mL | Freq: Once | INTRAVENOUS | Status: AC
  Administered 2013-06-11: 250 mL via INTRAVENOUS

## 2013-06-11 MED ORDER — SODIUM CHLORIDE 0.9 % IV SOLN: 100.0000 mL | INTRAVENOUS | Status: DC | PRN

## 2013-06-11 MED ORDER — NEPRO/CARBSTEADY PO LIQD: 237.0000 mL | ORAL | Status: DC | PRN

## 2013-06-11 MED ORDER — ALTEPLASE 2 MG IJ SOLR
2.0000 mg | Freq: Once | INTRAMUSCULAR | Status: DC | PRN
  Filled 2013-06-11: qty 2

## 2013-06-11 MED ORDER — LIDOCAINE-PRILOCAINE 2.5-2.5 % EX CREA
1.0000 "application " | TOPICAL_CREAM | CUTANEOUS | Status: DC | PRN
  Filled 2013-06-11: qty 5

## 2013-06-11 MED ORDER — LIDOCAINE HCL (PF) 1 % IJ SOLN: 5.0000 mL | INTRAMUSCULAR | Status: DC | PRN

## 2013-06-11 MED ORDER — PENTAFLUOROPROP-TETRAFLUOROETH EX AERO: 1.0000 "application " | INHALATION_SPRAY | CUTANEOUS | Status: DC | PRN

## 2013-06-11 MED ORDER — HEPARIN SODIUM (PORCINE) 1000 UNIT/ML DIALYSIS
1000.0000 [IU] | INTRAMUSCULAR | Status: DC | PRN
  Filled 2013-06-11: qty 1

## 2013-06-11 MED ORDER — CHLORHEXIDINE GLUCONATE 4 % EX LIQD
60.0000 mL | Freq: Once | CUTANEOUS | Status: AC
  Administered 2013-06-12: 4 via TOPICAL
  Filled 2013-06-11: qty 60

## 2013-06-11 NOTE — Progress Notes (Signed)
Called to see patient again tonight at 10:30 because of bleeding through the dressing. This morning dressing was dry. Heparin is therapeutic. Patient has DVT in his right arm. Patient's hemoglobin was 6.4 this morning and after 2 units of blood transfused was 6.2. Dressing on leg saturated during the latter portion of today. A dressing is removed tonight Punctate venous bleeding identified proximal aspect of the incision. Pressure and thrombin spray applied for 30 minutes while suture tray obtained. Thrombin spray applied. 3-0 silk sutures applied in the area of venous bleeding. This was observed for 5 minutes and no further bleeding occurred. This area was reinforced with more thrombin spray and Gelfoam. Bulky dressing reapplied. Heparin will be stopped for tonight and an assessment will be needed as to optimal anticoagulation versus risk of bleeding in the morning with the primary team and Dr. Sharol Given to. Again the patient does need dressing change under anesthesia earliy this week because of some 4 x 4's within the open wound. [name] calcaneus is exposed and minimal vascularity is present in this region. His prognosis is not very good for limb preservation with exposed calcaneus distally.

## 2013-06-11 NOTE — Procedures (Signed)
Patient was seen on dialysis and the procedure was supervised.  BFR 350  Via AVF BP is  99/58.   Patient appears to be tolerating treatment well- placed on 1 K for k of [place] 06/11/2013

## 2013-06-11 NOTE — Progress Notes (Signed)
[facility] TEAM 1 - Stepdown/ICU TEAM Progress Note  Steven Logan [id number] DOB: 06/06/1970 DOA: 06/02/2013 PCP: Tivis Ringer, MD  Admit HPI / Brief Narrative: 43 y.o. M w/ a history of Morbid obesity; Gout; HTN; Dyslipidemia; Pulmonary embolism; Pancreatitis; Arthritis; Depression; Diabetes mellitus; and chronic anemia who presented with right ankle swelling for 2 weeks.  At first it was diagnosed as gout and he was started on prednisone without improvement. Patient reported that he later bumped his leg and a wound opened up and started to ooze pus. Patient subsequently re-presented to the ER.  An MRI of the ankle revealed gas-containing abscess and cellulitis. Orthopedics took him emergently to the OR for debridement. Pt also reported right arm swelling for 2 days.    HPI/Subjective: Pt is seen in HD.  No new complaints today.  Is visibly upset about news of plan for amputation per Ortho.    Assessment/Plan:  Severe sepsis due to septic ankle & abscess of right lower leg / necrotizing fasciitis  Hemodynamically stabilized - cont empiric antibiotics - has required multiple trips to OR for I&D - cultures revealed MSSA from wound/op site - current coverage should be more than adequate - cont Zosyn for now - Ortho plans transtibial amputation 5/19  Acute nonocclusive DVT RUE jugular, subclavian, axillary, and brachial veins involved - avoid access/blood draws in R arm - cont IV Heparin - recent removal of HD vascath from right IJ 5/10 (had been in place since Aug 2014)  Anemia in chronic kidney disease / acute postoperative blood loss anemia  Follow trend - did experience extensive bleeding from wound 5/16 morning per RN report (soaked through dressing and was dripping on bed/floor) necessitating temporary stop of heparin - transfuse as needed to keep Hgb 7.0 or > - amputation should correct ongoing loss   Situational Depression Monitor closely in setting of upcoming  amputation  Poor venous access  Encephalopathy, toxic Mental status waxing and waining - no signif decline in MS today   End stage renal disease on HD M/W/F per Renal  Hypertension BP stable at present   DM / Hyperglycemia CBG improved - follow w/o change today - A1c 7.0 - does not appear to be on medical tx at home   Obesity - morbid - Body mass index is 29.24 kg/(m^2).  Code Status: FULL Family Communication: no family present at time of exam  Disposition Plan: SDU  Consultants: Ortho Nephro  Procedures: I and D on 06/03/2013  IRRIGATION AND DEBRIDEMENT EXTREMITY extensive excision of muscle fascia and soft tissue right leg 06/06/13  Antibiotics: Vanc 5/9 >5/17 Zosyn 5/9 >  DVT prophylaxis: IV heparin   Objective: Blood pressure 115/88, pulse 114, temperature 97.9 F (36.6 C), temperature source Oral, resp. rate 17, height 6\' 1"  (1.854 m), weight 221 lb 9 oz (100.5 kg), SpO2 100.00%.  Intake/Output Summary (Last 24 hours) at 06/11/13 1339 Last data filed at 06/11/13 1300  Gross per 24 hour  Intake 1742.42 ml  Output   1876 ml  Net -133.58 ml   Exam: General: No acute respiratory distress Lungs: Clear to auscultation bilaterally without wheezes or crackles Cardiovascular: Regular rate and rhythm without murmur gallop or rub  Abdomen: Nontender, soft, bowel sounds positive, no rebound, no ascites, no appreciable mass Extremities: RUE with improved edema extending from hand to shoulder; RLE dressed and dry at time of exam today without active bleeding  Data Reviewed: Basic Metabolic Panel:  Recent Labs Lab 06/06/13 0659 06/08/13 0318 06/09/13  0252 06/10/13 0505 06/11/13 0830 06/11/13 1100  NA 134* 133* 136* 135* 136*  --   K 4.5 4.7 4.6 5.8* 7.0* 4.1  CL 91* 93* 95* 95* 92*  --   CO2 23 23 27 22 20   --   GLUCOSE 203* 160* 254* 136* 165*  --   BUN 86* 74* 40* 60* 83*  --   CREATININE 7.47* 7.20* 4.90* 7.46* 9.41*  --   CALCIUM 8.5 8.1* 7.9* 7.7*  7.5*  --   PHOS 8.8*  --   --   --   --   --    Liver Function Tests:  Recent Labs Lab 06/06/13 0659  ALBUMIN 1.5*   CBC:  Recent Labs Lab 06/09/13 0252 06/10/13 0505 06/10/13 1500 06/10/13 2005 06/11/13 0830  WBC 21.1* 27.7* 25.0* 24.9* 24.1*  HGB 7.5* 5.6* 6.6* 6.4* 7.4*  HCT 22.9* 17.2* 20.2* 19.3* 21.9*  MCV 86.1 85.6 86.3 86.2 83.9  PLT 338 483* 513* 486* 511*   Cardiac Enzymes:  Recent Labs Lab 06/07/13 1623 06/07/13 2045 06/08/13 0318  TROPONINI <0.30 <0.30 <0.30   CBG:  Recent Labs Lab 06/10/13 1203 06/10/13 1729 06/11/13 0042 06/11/13 0948 06/11/13 1252  GLUCAP 127* 102* 140* 117* 113*    Recent Results (from the past 240 hour(s))  CULTURE, BLOOD (ROUTINE X 2)     Status: None   Collection Time    06/02/13 10:50 PM      Result Value Ref Range Status   Specimen Description BLOOD LEFT ARM   Final   Special Requests BOTTLES DRAWN AEROBIC AND ANAEROBIC 10CC EACH   Final   Culture  Setup Time     Final   Value: 06/03/2013 12:30     Performed at [facility]   Culture     Final   Value: NO GROWTH 5 DAYS     Performed at [facility]   Report Status 06/09/2013 FINAL   Final  CULTURE, BLOOD (ROUTINE X 2)     Status: None   Collection Time    06/02/13 10:55 PM      Result Value Ref Range Status   Specimen Description BLOOD LEFT HAND   Final   Special Requests BOTTLES DRAWN AEROBIC ONLY 10CC   Final   Culture  Setup Time     Final   Value: 06/03/2013 12:30     Performed at [facility]   Culture     Final   Value: NO GROWTH 5 DAYS     Performed at [facility]   Report Status 06/09/2013 FINAL   Final  CULTURE, ROUTINE-ABSCESS     Status: None   Collection Time    06/03/13 12:20 AM      Result Value Ref Range Status   Specimen Description ABSCESS RIGHT FOOT   Final   Special Requests ANKLE PATIENT ON FOLLOWING ZOSYN VANCOMYCIN   Final   Gram Stain     Final   Value: ABUNDANT WBC PRESENT, PREDOMINANTLY PMN      RARE SQUAMOUS EPITHELIAL CELLS PRESENT     ABUNDANT GRAM POSITIVE COCCI IN PAIRS     IN CLUSTERS     Performed at [facility]   Culture     Final   Value: MODERATE STAPHYLOCOCCUS AUREUS     Note: RIFAMPIN AND GENTAMICIN SHOULD NOT BE USED AS SINGLE DRUGS FOR TREATMENT OF STAPH INFECTIONS. This organism is presumed to be Clindamycin resistant based on detection of inducible  Clindamycin resistance.     Performed at [facility]   Report Status 06/05/2013 FINAL   Final   Organism ID, Bacteria STAPHYLOCOCCUS AUREUS   Final  ANAEROBIC CULTURE     Status: None   Collection Time    06/03/13 12:20 AM      Result Value Ref Range Status   Specimen Description ABSCESS RIGHT FOOT   Final   Special Requests ANKLE PATIENT ON FOLLOWING ZOSYN VANCOMYCIN   Final   Gram Stain     Final   Value: ABUNDANT WBC PRESENT, PREDOMINANTLY PMN     RARE SQUAMOUS EPITHELIAL CELLS PRESENT     ABUNDANT GRAM POSITIVE COCCI IN PAIRS     IN CLUSTERS     Performed at [facility]   Culture     Final   Value: NO ANAEROBES ISOLATED     Performed at [facility]   Report Status 06/08/2013 FINAL   Final  MRSA PCR SCREENING     Status: None   Collection Time    06/03/13  3:35 AM      Result Value Ref Range Status   MRSA by PCR NEGATIVE  NEGATIVE Final   Comment:            The GeneXpert MRSA Assay (FDA     approved for NASAL specimens     only), is one component of a     comprehensive MRSA colonization     surveillance program. It is not     intended to diagnose MRSA     infection nor to guide or     monitor treatment for     MRSA infections.  CULTURE, ROUTINE-ABSCESS     Status: None   Collection Time    06/04/13  5:59 PM      Result Value Ref Range Status   Specimen Description ABSCESS ANKLE RIGHT   Final   Special Requests PATIENT ON FOLLOWING VANCOMYCIN, UNASYN   Final   Gram Stain     Final   Value: ABUNDANT WBC PRESENT,BOTH PMN AND MONONUCLEAR     NO SQUAMOUS  EPITHELIAL CELLS SEEN     FEW GRAM POSITIVE COCCI     IN PAIRS IN CLUSTERS     Performed at [facility]   Culture     Final   Value: MODERATE STAPHYLOCOCCUS AUREUS     Note: RIFAMPIN AND GENTAMICIN SHOULD NOT BE USED AS SINGLE DRUGS FOR TREATMENT OF STAPH INFECTIONS. This organism is presumed to be Clindamycin resistant based on detection of inducible Clindamycin resistance.     Performed at [facility]   Report Status 06/07/2013 FINAL   Final   Organism ID, Bacteria STAPHYLOCOCCUS AUREUS   Final  ANAEROBIC CULTURE     Status: None   Collection Time    06/04/13  5:59 PM      Result Value Ref Range Status   Specimen Description ABSCESS ANKLE RIGHT   Final   Special Requests PATIENT ON FOLLOWING VANCOMYCIN, UNASYN   Final   Gram Stain     Final   Value: ABUNDANT WBC PRESENT,BOTH PMN AND MONONUCLEAR     NO SQUAMOUS EPITHELIAL CELLS SEEN     FEW GRAM POSITIVE COCCI     IN PAIRS IN CLUSTERS     Performed at [facility]   Culture     Final   Value: NO ANAEROBES ISOLATED     Performed at [facility]   Report  Status 06/09/2013 FINAL   Final  ANAEROBIC CULTURE     Status: None   Collection Time    06/04/13  6:07 PM      Result Value Ref Range Status   Specimen Description ABSCESS RIGHT LEG   Final   Special Requests     Final   Value: PATIENT ON FOLLOWING VANCOMYCIN UNASYN CULTURES FROM RIGHT CALF   Gram Stain     Final   Value: FEW WBC PRESENT,BOTH PMN AND MONONUCLEAR     NO SQUAMOUS EPITHELIAL CELLS SEEN     FEW GRAM POSITIVE COCCI     IN PAIRS     Performed at [facility]   Culture     Final   Value: NO ANAEROBES ISOLATED     Performed at [facility]   Report Status 06/09/2013 FINAL   Final  CULTURE, ROUTINE-ABSCESS     Status: None   Collection Time    06/04/13  6:07 PM      Result Value Ref Range Status   Specimen Description ABSCESS RIGHT LEG   Final   Special Requests     Final   Value: PATIENT ON FOLLOWING  VANCOMYCIN UNASYN CULTURES FROM RIGHT CALF   Gram Stain     Final   Value: RARE WBC PRESENT,BOTH PMN AND MONONUCLEAR     NO SQUAMOUS EPITHELIAL CELLS SEEN     FEW GRAM POSITIVE COCCI     IN PAIRS     Performed at [facility]   Culture     Final   Value: MODERATE STAPHYLOCOCCUS AUREUS     Note: SUSCEPTIBILITIES PERFORMED ON PREVIOUS CULTURE WITHIN THE LAST 5 DAYS.     Performed at [facility]   Report Status 06/07/2013 FINAL   Final     Studies:  Recent x-ray studies have been reviewed in detail by the Attending Physician  Scheduled Meds:  Scheduled Meds: . docusate sodium  100 mg Oral BID  . feeding supplement (GLUCERNA SHAKE)  237 mL Oral TID BM  . insulin aspart  0-20 Units Subcutaneous TID WC  . insulin glargine  15 Units Subcutaneous QHS  . labetalol  200 mg Oral BID  . multivitamin  1 tablet Oral QHS  . piperacillin-tazobactam (ZOSYN)  IV  2.25 g Intravenous Q8H  . polyethylene glycol  17 g Oral Daily  . sevelamer carbonate  800 mg Oral TID WC    Time spent on care of this patient: 35 mins  Erin Hearing, ANP [facility] For Consults/Admissions - Flow Manager - [phone] Office  [phone] Pager [phone]  On-Call/Text Page:      [email or web address]      password [facility]  06/11/2013, 1:39 PM   LOS: 9 days   I have personally examined this patient and reviewed the entire database. I have reviewed the above note, made any necessary editorial changes, and agree with its content.  Cherene Altes, MD [facility]

## 2013-06-11 NOTE — Progress Notes (Signed)
Chaplain responded to referral from RN. Chaplain introduced herself and offered support, but pt dozed off during conversation. Chaplain is available for follow up if needed.

## 2013-06-11 NOTE — Progress Notes (Signed)
Hemodialysis- Pt tolerating well. BP 119/45 HR 110. NP notified of hgb results 7.4 after multiple transfusions yesterday. Order to give 1 unit prbcs on hd in anticipation of OR tomorrow. Continue to monitor pt

## 2013-06-11 NOTE — Progress Notes (Signed)
ANTICOAGULATION CONSULT NOTE - Follow Up Consult  Pharmacy Consult for heparin Indication: RUE DVT  No Known Allergies  Assessment: [name] on heparin for RUE DVT.    Heparin currently on hold due to bleeding last evening   Goal of Therapy:  Heparin level 0.3-0.5 units/ml Monitor platelets by anticoagulation protocol: Yes   Plan:  1. Heparin on hold 2. OR planned for AM    Patient Measurements: Height: 6\' 1"  (185.4 cm) Weight: 224 lb 6.9 oz (101.8 kg) IBW/kg (Calculated) : 79.9 Heparin Dosing Weight: 103 kg  Vital Signs: Temp: 98.7 F (37.1 C) (05/18 0830) Temp src: Oral (05/18 0830) BP: 108/57 mmHg (05/18 0835) Pulse Rate: 101 (05/18 0835)  Labs:  Recent Labs  06/08/13 2058  06/09/13 0252 06/09/13 2230 06/10/13 0505 06/10/13 1500 06/10/13 1510 06/10/13 2005 06/11/13 0830  HGB  --   < > 7.5*  --  5.6* 6.6*  --  6.4* 7.4*  HCT  --   < > 22.9*  --  17.2* 20.2*  --  19.3* 21.9*  PLT  --   < > 338  --  483* 513*  --  486* 511*  HEPARINUNFRC 0.11*  --   --  0.22*  --   --  0.32  --   --   CREATININE  --   --  4.90*  --  7.46*  --   --   --   --   < > = values in this interval not displayed.  Estimated Creatinine Clearance: 16 ml/min (by C-G formula based on Cr of 7.46).   Thank you. Anette Guarneri, PharmD [phone]   06/11/2013 8:58 AM

## 2013-06-11 NOTE — Progress Notes (Addendum)
PT Cancellation Note  Patient Details Name: Steven Logan MRN: [id number] DOB: 12-13-1970   Cancelled Treatment:    Reason Eval/Treat Not Completed: Patient at procedure or test/unavailable. Per RN, pt in dialysis for the morning. Will attempt again this afternoon, time permitting.    Jolyn Lent 06/11/2013, 9:24 AM  1355 06/11/13: Pt refusing therapy services, stating he is too fatigued from dialysis to participate. Asking for something to eat and drink. RN notified.  Jolyn Lent, PT, DPT Acute Rehabilitation Services Pager: [phone]

## 2013-06-11 NOTE — Progress Notes (Signed)
Hemodialysis- Critical K level reported off to NP on unit. Order to place on 1K for 1 hour and recheck stat K. Continue to monitor pt

## 2013-06-11 NOTE — Progress Notes (Signed)
  [place] [facility] Progress Note   Subjective: no complaints  Filed Vitals:   06/11/13 1030 06/11/13 1052 06/11/13 1100 06/11/13 1107  BP: 104/61 94/58 104/63 104/63  Pulse: 120 118 114 115  Temp:    98 F (36.7 C)  TempSrc:      Resp: 20   19  Height:      Weight:      SpO2:    98%   Exam: Alert, no distress No jvd Chest clear bilat LUA AVF patent RRR no MRG Abd soft, NTND RLE wrapped No LE edema Neuro is nf, Ox3  HD: MWF [facility] 4.5h   111.5kg  2/2.5 Bath  500/800   Heparin 11000   LFA AVF Calcitriol 1.25ug   EPO 8000   Venofer s/p load completed 5/11       Assessment: 1 R ankle septic arthritis s/p I&D- cx's were +MSSA on zosyn IV 2 RLE comp syndrome s/p fasciotomy 3 ESRD on HD 4 HPTH on renagel , vit D 5 HTN  w low BP's on labetalol 6 Hyperkalemia use low K bath, repeat tonight  Plan- HD today, small UF, stop labetalol    Kelly Splinter MD  pager [phone]    cell [phone]  06/11/2013, 11:21 AM     Recent Labs Lab 06/06/13 0659  06/09/13 0252 06/10/13 0505 06/11/13 0830  NA 134*  < > 136* 135* 136*  K 4.5  < > 4.6 5.8* 7.0*  CL 91*  < > 95* 95* 92*  CO2 23  < > 27 22 20   GLUCOSE 203*  < > 254* 136* 165*  BUN 86*  < > 40* 60* 83*  CREATININE 7.47*  < > 4.90* 7.46* 9.41*  CALCIUM 8.5  < > 7.9* 7.7* 7.5*  PHOS 8.8*  --   --   --   --   < > = values in this interval not displayed.  Recent Labs Lab 06/06/13 0659  ALBUMIN 1.5*    Recent Labs Lab 06/10/13 1500 06/10/13 2005 06/11/13 0830  WBC 25.0* 24.9* 24.1*  HGB 6.6* 6.4* 7.4*  HCT 20.2* 19.3* 21.9*  MCV 86.3 86.2 83.9  PLT 513* 486* 511*   . docusate sodium  100 mg Oral BID  . feeding supplement (GLUCERNA SHAKE)  237 mL Oral TID BM  . insulin aspart  0-20 Units Subcutaneous TID WC  . insulin glargine  15 Units Subcutaneous QHS  . labetalol  200 mg Oral BID  . multivitamin  1 tablet Oral QHS  . piperacillin-tazobactam (ZOSYN)  IV  2.25 g Intravenous Q8H  . polyethylene  glycol  17 g Oral Daily  . sevelamer carbonate  800 mg Oral TID WC   . sodium chloride 10 mL/hr at 06/09/13 2200  . heparin Stopped (06/10/13 2310)   sodium chloride, sodium chloride, acetaminophen, acetaminophen, alteplase, bisacodyl, feeding supplement (NEPRO CARB STEADY), heparin, HYDROcodone-acetaminophen, HYDROmorphone (DILAUDID) injection, lidocaine (PF), lidocaine-prilocaine, metoCLOPramide (REGLAN) injection, metoCLOPramide, morphine injection, ondansetron (ZOFRAN) IV, ondansetron, oxyCODONE-acetaminophen, pentafluoroprop-tetrafluoroeth

## 2013-06-11 NOTE — Progress Notes (Signed)
Patient ID: Steven Logan, male   DOB: 1970/06/13, 43 y.o.   MRN: [id number] Patient with nausea and vomiting this morning. Discussed with the patient that we will proceed with a transtibial amputation tomorrow morning. Discussed that his condition is still guarded that this will heal.

## 2013-06-11 NOTE — Progress Notes (Signed)
Dr Marlou Sa called and made aware of persistent bleeding from LLE.Saturated through present dressing and 2 pads. H/H 6.4/19.3. Pt receiving 1st unit of PRBCs.

## 2013-06-12 ENCOUNTER — Encounter (HOSPITAL_COMMUNITY)
Admission: EM | Disposition: A | Discharge status: Skilled Nursing Facility | Payer: Self-pay | Source: Home / Self Care | Attending: Internal Medicine

## 2013-06-12 ENCOUNTER — Inpatient Hospital Stay (HOSPITAL_COMMUNITY): Payer: BC Managed Care – PPO | Admitting: Certified Registered"

## 2013-06-12 ENCOUNTER — Encounter (HOSPITAL_COMMUNITY): Payer: BC Managed Care – PPO | Admitting: Certified Registered"

## 2013-06-12 ENCOUNTER — Encounter (HOSPITAL_COMMUNITY): Payer: Self-pay | Admitting: Certified Registered Nurse Anesthetist

## 2013-06-12 DIAGNOSIS — I82629 Acute embolism and thrombosis of deep veins of unspecified upper extremity: Secondary | ICD-10-CM

## 2013-06-12 HISTORY — PX: AMPUTATION: SHX166

## 2013-06-12 LAB — TYPE AND SCREEN
ABO/RH(D): A POS
ANTIBODY SCREEN: NEGATIVE
UNIT DIVISION: 0
UNIT DIVISION: 0
UNIT DIVISION: 0
UNIT DIVISION: 0
UNIT DIVISION: 0
Unit division: 0
Unit division: 0
Unit division: 0
Unit division: 0

## 2013-06-12 LAB — CBC
HCT: 22.6 % — ABNORMAL LOW (ref 39.0–52.0)
Hemoglobin: 7.5 g/dL — ABNORMAL LOW (ref 13.0–17.0)
MCH: 28.3 pg (ref 26.0–34.0)
MCHC: 33.2 g/dL (ref 30.0–36.0)
MCV: 85.3 fL (ref 78.0–100.0)
Platelets: 518 10*3/uL — ABNORMAL HIGH (ref 150–400)
RBC: 2.65 MIL/uL — AB (ref 4.22–5.81)
RDW: 15.9 % — AB (ref 11.5–15.5)
WBC: 20.1 10*3/uL — ABNORMAL HIGH (ref 4.0–10.5)

## 2013-06-12 LAB — BASIC METABOLIC PANEL
BUN: 52 mg/dL — ABNORMAL HIGH (ref 6–23)
CO2: 26 meq/L (ref 19–32)
Calcium: 7.7 mg/dL — ABNORMAL LOW (ref 8.4–10.5)
Chloride: 97 mEq/L (ref 96–112)
Creatinine, Ser: 6.76 mg/dL — ABNORMAL HIGH (ref 0.50–1.35)
GFR calc Af Amer: 10 mL/min — ABNORMAL LOW (ref >90)
GFR calc non Af Amer: 9 mL/min — ABNORMAL LOW (ref >90)
Glucose, Bld: 111 mg/dL — ABNORMAL HIGH (ref 70–99)
Potassium: 5.1 mEq/L (ref 3.7–5.3)
Sodium: 140 mEq/L (ref 137–147)

## 2013-06-12 LAB — GLUCOSE, CAPILLARY
GLUCOSE-CAPILLARY: 132 mg/dL — AB (ref 70–99)
GLUCOSE-CAPILLARY: 114 mg/dL — AB (ref 70–99)
GLUCOSE-CAPILLARY: 152 mg/dL — AB (ref 70–99)
Glucose-Capillary: 133 mg/dL — ABNORMAL HIGH (ref 70–99)
Glucose-Capillary: 158 mg/dL — ABNORMAL HIGH (ref 70–99)

## 2013-06-12 SURGERY — AMPUTATION BELOW KNEE
Anesthesia: General | Site: Leg Lower | Laterality: Right

## 2013-06-12 MED ORDER — ROCURONIUM BROMIDE 50 MG/5ML IV SOLN
INTRAVENOUS | Status: AC
  Filled 2013-06-12: qty 1

## 2013-06-12 MED ORDER — HYDROMORPHONE HCL PF 1 MG/ML IJ SOLN
0.2500 mg | INTRAMUSCULAR | Status: DC | PRN
  Administered 2013-06-12: 0.5 mg via INTRAVENOUS

## 2013-06-12 MED ORDER — MIDAZOLAM HCL 2 MG/2ML IJ SOLN
INTRAMUSCULAR | Status: AC
  Filled 2013-06-12: qty 2

## 2013-06-12 MED ORDER — MEPERIDINE HCL 25 MG/ML IJ SOLN
6.2500 mg | INTRAMUSCULAR | Status: DC | PRN
Start: 2013-06-12 — End: 2013-06-12

## 2013-06-12 MED ORDER — HEPARIN SODIUM (PORCINE) 1000 UNIT/ML DIALYSIS
1000.0000 [IU] | INTRAMUSCULAR | Status: DC | PRN
  Filled 2013-06-12: qty 1

## 2013-06-12 MED ORDER — ONDANSETRON HCL 4 MG/2ML IJ SOLN
INTRAMUSCULAR | Status: DC | PRN
  Administered 2013-06-12: 4 mg via INTRAVENOUS

## 2013-06-12 MED ORDER — 0.9 % SODIUM CHLORIDE (POUR BTL) OPTIME
TOPICAL | Status: DC | PRN
  Administered 2013-06-12: 1000 mL

## 2013-06-12 MED ORDER — OXYCODONE HCL 5 MG/5ML PO SOLN: 5.0000 mg | Freq: Once | ORAL | Status: DC | PRN

## 2013-06-12 MED ORDER — ONDANSETRON HCL 4 MG/2ML IJ SOLN: 4.0000 mg | Freq: Once | INTRAMUSCULAR | Status: DC | PRN

## 2013-06-12 MED ORDER — PENTAFLUOROPROP-TETRAFLUOROETH EX AERO: 1.0000 "application " | INHALATION_SPRAY | CUTANEOUS | Status: DC | PRN

## 2013-06-12 MED ORDER — METOCLOPRAMIDE HCL 5 MG/ML IJ SOLN: 5.0000 mg | Freq: Three times a day (TID) | INTRAMUSCULAR | Status: DC | PRN

## 2013-06-12 MED ORDER — GABAPENTIN 300 MG PO CAPS
300.0000 mg | ORAL_CAPSULE | Freq: Three times a day (TID) | ORAL | Status: DC
  Administered 2013-06-12 – 2013-06-13 (×3): 300 mg via ORAL
  Filled 2013-06-12 (×4): qty 1

## 2013-06-12 MED ORDER — LIDOCAINE HCL (CARDIAC) 20 MG/ML IV SOLN
INTRAVENOUS | Status: DC | PRN
  Administered 2013-06-12: 80 mg via INTRAVENOUS

## 2013-06-12 MED ORDER — SODIUM CHLORIDE 0.9 % IV SOLN: 100.0000 mL | INTRAVENOUS | Status: DC | PRN

## 2013-06-12 MED ORDER — HYDROMORPHONE HCL PF 1 MG/ML IJ SOLN
INTRAMUSCULAR | Status: AC
  Administered 2013-06-12: 0.5 mg via INTRAVENOUS
  Filled 2013-06-12: qty 2

## 2013-06-12 MED ORDER — METOCLOPRAMIDE HCL 5 MG PO TABS: 5.0000 mg | ORAL_TABLET | Freq: Three times a day (TID) | ORAL | Status: DC | PRN

## 2013-06-12 MED ORDER — HEPARIN SODIUM (PORCINE) 1000 UNIT/ML DIALYSIS
20.0000 [IU]/kg | Freq: Once | INTRAMUSCULAR | Status: DC
  Filled 2013-06-12: qty 2

## 2013-06-12 MED ORDER — ONDANSETRON HCL 4 MG/2ML IJ SOLN: 4.0000 mg | Freq: Four times a day (QID) | INTRAMUSCULAR | Status: DC | PRN

## 2013-06-12 MED ORDER — HYDROMORPHONE HCL PF 1 MG/ML IJ SOLN: 0.5000 mg | INTRAMUSCULAR | Status: DC | PRN

## 2013-06-12 MED ORDER — METHOCARBAMOL 500 MG PO TABS
ORAL_TABLET | ORAL | Status: AC
Start: 2013-06-12 — End: 2013-06-13
  Filled 2013-06-12: qty 1

## 2013-06-12 MED ORDER — NEPRO/CARBSTEADY PO LIQD: 237.0000 mL | ORAL | Status: DC | PRN

## 2013-06-12 MED ORDER — NEOSTIGMINE METHYLSULFATE 10 MG/10ML IV SOLN
INTRAVENOUS | Status: DC | PRN
  Administered 2013-06-12: 4 mg via INTRAVENOUS

## 2013-06-12 MED ORDER — METHOCARBAMOL 1000 MG/10ML IJ SOLN
500.0000 mg | Freq: Four times a day (QID) | INTRAVENOUS | Status: DC | PRN
  Filled 2013-06-12: qty 5

## 2013-06-12 MED ORDER — FENTANYL CITRATE 0.05 MG/ML IJ SOLN
INTRAMUSCULAR | Status: AC
  Filled 2013-06-12: qty 5

## 2013-06-12 MED ORDER — GLYCOPYRROLATE 0.2 MG/ML IJ SOLN
INTRAMUSCULAR | Status: DC | PRN
  Administered 2013-06-12: 0.6 mg via INTRAVENOUS

## 2013-06-12 MED ORDER — FENTANYL CITRATE 0.05 MG/ML IJ SOLN
INTRAMUSCULAR | Status: DC | PRN
  Administered 2013-06-12: 100 ug via INTRAVENOUS
  Administered 2013-06-12: 50 ug via INTRAVENOUS

## 2013-06-12 MED ORDER — LIDOCAINE-PRILOCAINE 2.5-2.5 % EX CREA
1.0000 "application " | TOPICAL_CREAM | CUTANEOUS | Status: DC | PRN
  Filled 2013-06-12: qty 5

## 2013-06-12 MED ORDER — ONDANSETRON HCL 4 MG PO TABS: 4.0000 mg | ORAL_TABLET | Freq: Four times a day (QID) | ORAL | Status: DC | PRN

## 2013-06-12 MED ORDER — LIDOCAINE HCL (PF) 1 % IJ SOLN: 5.0000 mL | INTRAMUSCULAR | Status: DC | PRN

## 2013-06-12 MED ORDER — LIDOCAINE HCL (CARDIAC) 20 MG/ML IV SOLN
INTRAVENOUS | Status: AC
  Filled 2013-06-12: qty 5

## 2013-06-12 MED ORDER — PROPOFOL 10 MG/ML IV BOLUS
INTRAVENOUS | Status: DC | PRN
  Administered 2013-06-12: 200 mg via INTRAVENOUS

## 2013-06-12 MED ORDER — MIDAZOLAM HCL 5 MG/5ML IJ SOLN
INTRAMUSCULAR | Status: DC | PRN
  Administered 2013-06-12: 2 mg via INTRAVENOUS

## 2013-06-12 MED ORDER — ONDANSETRON HCL 4 MG/2ML IJ SOLN
INTRAMUSCULAR | Status: AC
  Filled 2013-06-12: qty 2

## 2013-06-12 MED ORDER — METHOCARBAMOL 500 MG PO TABS
500.0000 mg | ORAL_TABLET | Freq: Four times a day (QID) | ORAL | Status: DC | PRN
  Administered 2013-06-12 – 2013-06-16 (×3): 500 mg via ORAL
  Filled 2013-06-12 (×2): qty 1

## 2013-06-12 MED ORDER — ALTEPLASE 2 MG IJ SOLR
2.0000 mg | Freq: Once | INTRAMUSCULAR | Status: AC | PRN
  Filled 2013-06-12: qty 2

## 2013-06-12 MED ORDER — OXYCODONE HCL 5 MG PO TABS: 5.0000 mg | ORAL_TABLET | Freq: Once | ORAL | Status: DC | PRN

## 2013-06-12 MED ORDER — ROCURONIUM BROMIDE 100 MG/10ML IV SOLN
INTRAVENOUS | Status: DC | PRN
  Administered 2013-06-12: 35 mg via INTRAVENOUS

## 2013-06-12 MED ORDER — HYDROMORPHONE HCL PF 1 MG/ML IJ SOLN
0.2500 mg | INTRAMUSCULAR | Status: DC | PRN
  Administered 2013-06-12 (×3): 0.5 mg via INTRAVENOUS

## 2013-06-12 SURGICAL SUPPLY — 43 items
BANDAGE ESMARK 6X9 LF (GAUZE/BANDAGES/DRESSINGS) ×1 IMPLANT
BANDAGE GAUZE ELAST BULKY 4 IN (GAUZE/BANDAGES/DRESSINGS) ×6 IMPLANT
BLADE SAW RECIP 87.9 MT (BLADE) ×3 IMPLANT
BLADE SURG 21 STRL SS (BLADE) ×3 IMPLANT
BNDG COHESIVE 6X5 TAN STRL LF (GAUZE/BANDAGES/DRESSINGS) ×3 IMPLANT
BNDG ESMARK 6X9 LF (GAUZE/BANDAGES/DRESSINGS) ×3
BNDG GAUZE ELAST 4 BULKY (GAUZE/BANDAGES/DRESSINGS) ×3 IMPLANT
COVER SURGICAL LIGHT HANDLE (MISCELLANEOUS) ×6 IMPLANT
CUFF TOURNIQUET SINGLE 34IN LL (TOURNIQUET CUFF) ×3 IMPLANT
CUFF TOURNIQUET SINGLE 44IN (TOURNIQUET CUFF) IMPLANT
DRAIN PENROSE 1/2X12 LTX STRL (WOUND CARE) IMPLANT
DRAPE EXTREMITY T 121X128X90 (DRAPE) ×3 IMPLANT
DRAPE PROXIMA HALF (DRAPES) ×6 IMPLANT
DRAPE U-SHAPE 47X51 STRL (DRAPES) ×6 IMPLANT
DRSG ADAPTIC 3X8 NADH LF (GAUZE/BANDAGES/DRESSINGS) ×3 IMPLANT
DRSG PAD ABDOMINAL 8X10 ST (GAUZE/BANDAGES/DRESSINGS) ×3 IMPLANT
DURAPREP 26ML APPLICATOR (WOUND CARE) IMPLANT
ELECT REM PT RETURN 9FT ADLT (ELECTROSURGICAL) ×3
ELECTRODE REM PT RTRN 9FT ADLT (ELECTROSURGICAL) ×1 IMPLANT
GLOVE BIOGEL PI IND STRL 9 (GLOVE) ×1 IMPLANT
GLOVE BIOGEL PI INDICATOR 9 (GLOVE) ×2
GLOVE SURG ORTHO 9.0 STRL STRW (GLOVE) ×3 IMPLANT
GOWN STRL REUS W/ TWL XL LVL3 (GOWN DISPOSABLE) ×2 IMPLANT
GOWN STRL REUS W/TWL XL LVL3 (GOWN DISPOSABLE) ×4
KIT BASIN OR (CUSTOM PROCEDURE TRAY) ×3 IMPLANT
KIT ROOM TURNOVER OR (KITS) ×3 IMPLANT
MANIFOLD NEPTUNE II (INSTRUMENTS) ×3 IMPLANT
NS IRRIG 1000ML POUR BTL (IV SOLUTION) ×3 IMPLANT
PACK GENERAL/GYN (CUSTOM PROCEDURE TRAY) ×3 IMPLANT
PAD ARMBOARD 7.5X6 YLW CONV (MISCELLANEOUS) ×6 IMPLANT
SPONGE GAUZE 4X4 12PLY (GAUZE/BANDAGES/DRESSINGS) ×3 IMPLANT
SPONGE GAUZE 4X4 12PLY STER LF (GAUZE/BANDAGES/DRESSINGS) ×3 IMPLANT
SPONGE LAP 18X18 X RAY DECT (DISPOSABLE) IMPLANT
STAPLER VISISTAT 35W (STAPLE) IMPLANT
STOCKINETTE IMPERVIOUS LG (DRAPES) ×3 IMPLANT
SUT PDS AB 1 CT  36 (SUTURE)
SUT PDS AB 1 CT 36 (SUTURE) IMPLANT
SUT SILK 2 0 (SUTURE) ×2
SUT SILK 2-0 18XBRD TIE 12 (SUTURE) ×1 IMPLANT
TOWEL OR 17X24 6PK STRL BLUE (TOWEL DISPOSABLE) ×3 IMPLANT
TOWEL OR 17X26 10 PK STRL BLUE (TOWEL DISPOSABLE) ×3 IMPLANT
TUBE ANAEROBIC SPECIMEN COL (MISCELLANEOUS) IMPLANT
WATER STERILE IRR 1000ML POUR (IV SOLUTION) ×3 IMPLANT

## 2013-06-12 NOTE — Anesthesia Postprocedure Evaluation (Signed)
  Anesthesia Post-op Note  Patient: Steven Logan  Procedure(s) Performed: Procedure(s): Transtibial Amputation (Right)  Patient Location: PACU  Anesthesia Type:General  Level of Consciousness: awake, alert  and oriented  Airway and Oxygen Therapy: Patient Spontanous Breathing and Patient connected to nasal cannula oxygen  Post-op Pain: mild  Post-op Assessment: Post-op Vital signs reviewed, Patient's Cardiovascular Status Stable, Respiratory Function Stable, Patent Airway and Pain level controlled  Post-op Vital Signs: stable  Last Vitals:  Filed Vitals:   06/12/13 2045  BP: 167/80  Pulse: 98  Temp:   Resp: 13    Complications: No apparent anesthesia complications

## 2013-06-12 NOTE — Progress Notes (Signed)
Nurse requested chaplin at the request of patient. Chaplin met with pt and family and provided spiritual support and prayer.   Charyl Dancer, Chaplain  06/12/13 1300  Clinical Encounter Type  Visited With Patient and family together  Visit Type Spiritual support  Referral From Nurse  Consult/Referral To Chaplain  Spiritual Encounters  Spiritual Needs Prayer;Emotional  Stress Factors  Patient Stress Factors Family relationships  Family Stress Factors Family relationships

## 2013-06-12 NOTE — Op Note (Signed)
OPERATIVE REPORT  DATE OF SURGERY: 06/12/2013  PATIENT:  Steven Logan,  43 y.o. male  PRE-OPERATIVE DIAGNOSIS:  Necrotizing Fascitis Right Leg  POST-OPERATIVE DIAGNOSIS:  Necrotizing Fascitis Right Leg  PROCEDURE:  Procedure(s): Transtibial Amputation  SURGEON:  Surgeon(s): Newt Minion, MD  ANESTHESIA:   general  EBL:  min ML  SPECIMEN:  Source of Specimen:  Right leg  TOURNIQUET:   Total Tourniquet Time Documented: Thigh (Right) - 9 minutes Total: Thigh (Right) - 9 minutes   PROCEDURE DETAILS: Patient is a 43 year old gentleman diabetic end-stage renal disease on dialysis who is status post multiple irrigation and debridements for necrotizing fasciitis involving the right lower extremity. 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 Patient states he understands and wished to proceed at this time. Discussed the risk of wound.healing potential need for additional surgery and a higher level amputation. Description of procedure patient was brought to the operating room underwent a general anesthetic. After adequate levels of anesthesia were obtained patient's right lower extremity was prepped using Betadine paint and draped into a sterile field. The foot was draped out of sterile field with impervious stockinette. A transverse incision was made 11 cm distal to the tibial tubercle is curved posteriorly towards posterior flap was created. The posterior flap had been modified to incorporate the incision that went to the popliteal fossa had been incorporated into the posterior flap. The tibia was transected 1 cm proximal to the skin incision the fibula was transected just proximal to the tibial incision the tibia was beveled. In addition this was used to create a large posterior flap. The sciatic  nerve was pulled allowed retractors the vascular bundles were suture ligated with 2-0 silk. Marijo File was deflated hemostasis was obtained. Patient has significant calcification of his vessels. The muscles appeared viable with good color contractility and consistency. The deep and superficial fascial layers were closed using #1 PDS. The skin was closed using staples. The wound is covered with a sterile compressive dressing. Patient was extubated taken to the PACU in stable condition.  PLAN OF CARE: Admit to inpatient   PATIENT DISPOSITION:  PACU - hemodynamically stable.   Newt Minion, MD 06/12/2013 6:21 PM

## 2013-06-12 NOTE — Addendum Note (Signed)
Addendum created 06/12/13 2135 by Roberts Gaudy, MD   Modules edited: Notes Section   Notes Section:  File: [id number]

## 2013-06-12 NOTE — Progress Notes (Signed)
[place] TEAM 1 - Stepdown/ICU TEAM Progress Note  Steven Logan [id number] DOB: 1970-10-08 DOA: 06/02/2013 PCP: Tivis Ringer, MD  Admit HPI / Brief Narrative: 43 y.o. M w/ a history of Morbid obesity; Gout; HTN; Dyslipidemia; Pulmonary embolism; Pancreatitis; Arthritis; Depression; Diabetes mellitus; and chronic anemia who presented with right ankle swelling for 2 weeks.  At first it was diagnosed as gout and he was started on prednisone without improvement. Patient reported that he later bumped his leg and a wound opened up and started to ooze pus. Patient subsequently re-presented to the ER.  An MRI of the ankle revealed gas-containing abscess and cellulitis. Orthopedics took him emergently to the OR for debridement. Pt also reported right arm swelling for 2 days.    HPI/Subjective: Sitting on side of bed- VERY DEPRESSED about planned surgery today- says "I don't understand what's going on" Discussed with him rationale and basic expectations post amputation-requests chaplain to return for visit  Assessment/Plan:  Severe sepsis due to septic ankle & abscess of right lower leg / necrotizing fasciitis  Hemodynamically stabilized - cont empiric antibiotics - has required multiple trips to OR for I&D - cultures revealed MSSA from wound/op site - current coverage should be more than adequate - cont Zosyn for now - Ortho plans transtibial amputation 5/19-pt HAS NOT signed consent- very conflicted over obvious need for surgery but verbalizes very frightened about his future-later in am we returned to room to discuss clinical status with pt and family and he now agrees to proceed with amputation   Acute nonocclusive DVT RUE jugular, subclavian, axillary, and brachial veins involved - avoid access/blood draws in R arm - cont IV Heparin - recent removal of HD vascath from right IJ 5/10 (had been in place since Aug 2014)-resume Heparin post op once OK with surgery  Anemia in chronic kidney  disease / acute postoperative blood loss anemia  Follow trend - did experience extensive bleeding from wound 5/16 morning per RN report (soaked through dressing and was dripping on bed/floor) necessitating temporary stop of heparin - transfuse as needed to keep Hgb 7.0 or > - amputation should correct ongoing loss   Situational Depression Monitor closely in setting of upcoming amputation-see above- emotional support given and follow up chaplain visit requested  Poor venous access  Encephalopathy, toxic Mental status waxing and waining - no signif decline in MS today   End stage renal disease on HD M/W/F per Renal  Hypertension BP stable at present   DM / Hyperglycemia CBG improved - follow w/o change today - A1c 7.0 - does not appear to be on medical tx at home   Obesity - morbid - Body mass index is 29.24 kg/(m^2).  Code Status: FULL Family Communication: Extensive conversation with patient's brother and sisters- concerns addressed-unit AD/Monica also at bedside- plans to ask Patient Experience to visit with the patient and family at their request re: concerns about medical care received prior to this admission Disposition Plan: SDU  Consultants: Ortho Nephro  Procedures: I and D on 06/03/2013  IRRIGATION AND DEBRIDEMENT EXTREMITY extensive excision of muscle fascia and soft tissue right leg 06/06/13  Antibiotics: Vanc 5/9 >5/17 Zosyn 5/9 >  DVT prophylaxis: IV heparin   Objective: Blood pressure 141/61, pulse 96, temperature 98.4 F (36.9 C), temperature source Oral, resp. rate 16, height 6\' 1"  (1.854 m), weight 221 lb 9 oz (100.5 kg), SpO2 97.00%.  Intake/Output Summary (Last 24 hours) at 06/12/13 0852 Last data filed at 06/12/13 0600  Gross per 24 hour  Intake    955 ml  Output   1701 ml  Net   -746 ml   Exam: General: No acute respiratory distress-very flat and sad affect Lungs: Clear to auscultation bilaterally without wheezes or crackles Cardiovascular:  Regular rate and rhythm without murmur gallop or rub  Abdomen: Nontender, soft, bowel sounds positive, no rebound, no ascites, no appreciable mass Extremities: RUE with improved edema extending from hand to shoulder; RLE dressed and dry at time of exam today without active bleeding  Data Reviewed: Basic Metabolic Panel:  Recent Labs Lab 06/06/13 0659  06/09/13 0252 06/10/13 0505 06/11/13 0830 06/11/13 1100 06/11/13 1500 06/12/13 0450  NA 134*  < > 136* 135* 136*  --  137 140  K 4.5  < > 4.6 5.8* 7.0* 4.1 4.9 5.1  CL 91*  < > 95* 95* 92*  --  94* 97  CO2 23  < > 27 22 20   --  25 26  GLUCOSE 203*  < > 254* 136* 165*  --  126* 111*  BUN 86*  < > 40* 60* 83*  --  41* 52*  CREATININE 7.47*  < > 4.90* 7.46* 9.41*  --  5.53* 6.76*  CALCIUM 8.5  < > 7.9* 7.7* 7.5*  --  8.0* 7.7*  PHOS 8.8*  --   --   --   --   --   --   --   < > = values in this interval not displayed. Liver Function Tests:  Recent Labs Lab 06/06/13 0659  ALBUMIN 1.5*   CBC:  Recent Labs Lab 06/10/13 0505 06/10/13 1500 06/10/13 2005 06/11/13 0830 06/12/13 0450  WBC 27.7* 25.0* 24.9* 24.1* 20.1*  HGB 5.6* 6.6* 6.4* 7.4* 7.5*  HCT 17.2* 20.2* 19.3* 21.9* 22.6*  MCV 85.6 86.3 86.2 83.9 85.3  PLT 483* 513* 486* 511* 518*   Cardiac Enzymes:  Recent Labs Lab 06/07/13 1623 06/07/13 2045 06/08/13 0318  TROPONINI <0.30 <0.30 <0.30   CBG:  Recent Labs Lab 06/11/13 0948 06/11/13 1252 06/11/13 1625 06/11/13 2143 06/12/13 0751  GLUCAP 117* 113* 119* 189* 133*    Recent Results (from the past 240 hour(s))  CULTURE, BLOOD (ROUTINE X 2)     Status: None   Collection Time    06/02/13 10:50 PM      Result Value Ref Range Status   Specimen Description BLOOD LEFT ARM   Final   Special Requests BOTTLES DRAWN AEROBIC AND ANAEROBIC 10CC EACH   Final   Culture  Setup Time     Final   Value: 06/03/2013 12:30     Performed at [facility]   Culture     Final   Value: NO GROWTH 5 DAYS      Performed at [facility]   Report Status 06/09/2013 FINAL   Final  CULTURE, BLOOD (ROUTINE X 2)     Status: None   Collection Time    06/02/13 10:55 PM      Result Value Ref Range Status   Specimen Description BLOOD LEFT HAND   Final   Special Requests BOTTLES DRAWN AEROBIC ONLY 10CC   Final   Culture  Setup Time     Final   Value: 06/03/2013 12:30     Performed at [facility]   Culture     Final   Value: NO GROWTH 5 DAYS     Performed at [facility]   Report Status 06/09/2013  FINAL   Final  CULTURE, ROUTINE-ABSCESS     Status: None   Collection Time    06/03/13 12:20 AM      Result Value Ref Range Status   Specimen Description ABSCESS RIGHT FOOT   Final   Special Requests ANKLE PATIENT ON FOLLOWING ZOSYN VANCOMYCIN   Final   Gram Stain     Final   Value: ABUNDANT WBC PRESENT, PREDOMINANTLY PMN     RARE SQUAMOUS EPITHELIAL CELLS PRESENT     ABUNDANT GRAM POSITIVE COCCI IN PAIRS     IN CLUSTERS     Performed at [facility]   Culture     Final   Value: MODERATE STAPHYLOCOCCUS AUREUS     Note: RIFAMPIN AND GENTAMICIN SHOULD NOT BE USED AS SINGLE DRUGS FOR TREATMENT OF STAPH INFECTIONS. This organism is presumed to be Clindamycin resistant based on detection of inducible Clindamycin resistance.     Performed at [facility]   Report Status 06/05/2013 FINAL   Final   Organism ID, Bacteria STAPHYLOCOCCUS AUREUS   Final  ANAEROBIC CULTURE     Status: None   Collection Time    06/03/13 12:20 AM      Result Value Ref Range Status   Specimen Description ABSCESS RIGHT FOOT   Final   Special Requests ANKLE PATIENT ON FOLLOWING ZOSYN VANCOMYCIN   Final   Gram Stain     Final   Value: ABUNDANT WBC PRESENT, PREDOMINANTLY PMN     RARE SQUAMOUS EPITHELIAL CELLS PRESENT     ABUNDANT GRAM POSITIVE COCCI IN PAIRS     IN CLUSTERS     Performed at [facility]   Culture     Final   Value: NO ANAEROBES ISOLATED     Performed at [facility]   Report Status 06/08/2013 FINAL   Final  MRSA PCR SCREENING     Status: None   Collection Time    06/03/13  3:35 AM      Result Value Ref Range Status   MRSA by PCR NEGATIVE  NEGATIVE Final   Comment:            The GeneXpert MRSA Assay (FDA     approved for NASAL specimens     only), is one component of a     comprehensive MRSA colonization     surveillance program. It is not     intended to diagnose MRSA     infection nor to guide or     monitor treatment for     MRSA infections.  CULTURE, ROUTINE-ABSCESS     Status: None   Collection Time    06/04/13  5:59 PM      Result Value Ref Range Status   Specimen Description ABSCESS ANKLE RIGHT   Final   Special Requests PATIENT ON FOLLOWING VANCOMYCIN, UNASYN   Final   Gram Stain     Final   Value: ABUNDANT WBC PRESENT,BOTH PMN AND MONONUCLEAR     NO SQUAMOUS EPITHELIAL CELLS SEEN     FEW GRAM POSITIVE COCCI     IN PAIRS IN CLUSTERS     Performed at [facility]   Culture     Final   Value: MODERATE STAPHYLOCOCCUS AUREUS     Note: RIFAMPIN AND GENTAMICIN SHOULD NOT BE USED AS SINGLE DRUGS FOR TREATMENT OF STAPH INFECTIONS. This organism is presumed to be Clindamycin resistant based on detection of inducible Clindamycin resistance.  Performed at [facility]   Report Status 06/07/2013 FINAL   Final   Organism ID, Bacteria STAPHYLOCOCCUS AUREUS   Final  ANAEROBIC CULTURE     Status: None   Collection Time    06/04/13  5:59 PM      Result Value Ref Range Status   Specimen Description ABSCESS ANKLE RIGHT   Final   Special Requests PATIENT ON FOLLOWING VANCOMYCIN, UNASYN   Final   Gram Stain     Final   Value: ABUNDANT WBC PRESENT,BOTH PMN AND MONONUCLEAR     NO SQUAMOUS EPITHELIAL CELLS SEEN     FEW GRAM POSITIVE COCCI     IN PAIRS IN CLUSTERS     Performed at [facility]   Culture     Final   Value: NO ANAEROBES ISOLATED     Performed at [facility]   Report Status 06/09/2013  FINAL   Final  ANAEROBIC CULTURE     Status: None   Collection Time    06/04/13  6:07 PM      Result Value Ref Range Status   Specimen Description ABSCESS RIGHT LEG   Final   Special Requests     Final   Value: PATIENT ON FOLLOWING VANCOMYCIN UNASYN CULTURES FROM RIGHT CALF   Gram Stain     Final   Value: FEW WBC PRESENT,BOTH PMN AND MONONUCLEAR     NO SQUAMOUS EPITHELIAL CELLS SEEN     FEW GRAM POSITIVE COCCI     IN PAIRS     Performed at [facility]   Culture     Final   Value: NO ANAEROBES ISOLATED     Performed at [facility]   Report Status 06/09/2013 FINAL   Final  CULTURE, ROUTINE-ABSCESS     Status: None   Collection Time    06/04/13  6:07 PM      Result Value Ref Range Status   Specimen Description ABSCESS RIGHT LEG   Final   Special Requests     Final   Value: PATIENT ON FOLLOWING VANCOMYCIN UNASYN CULTURES FROM RIGHT CALF   Gram Stain     Final   Value: RARE WBC PRESENT,BOTH PMN AND MONONUCLEAR     NO SQUAMOUS EPITHELIAL CELLS SEEN     FEW GRAM POSITIVE COCCI     IN PAIRS     Performed at [facility]   Culture     Final   Value: MODERATE STAPHYLOCOCCUS AUREUS     Note: SUSCEPTIBILITIES PERFORMED ON PREVIOUS CULTURE WITHIN THE LAST 5 DAYS.     Performed at [facility]   Report Status 06/07/2013 FINAL   Final     Studies:  Recent x-ray studies have been reviewed in detail by the Attending Physician  Scheduled Meds:  Scheduled Meds: . docusate sodium  100 mg Oral BID  . feeding supplement (GLUCERNA SHAKE)  237 mL Oral TID BM  . insulin aspart  0-20 Units Subcutaneous TID WC  . insulin glargine  15 Units Subcutaneous QHS  . multivitamin  1 tablet Oral QHS  . piperacillin-tazobactam (ZOSYN)  IV  2.25 g Intravenous Q8H  . polyethylene glycol  17 g Oral Daily  . sevelamer carbonate  800 mg Oral TID WC    Time spent on care of this patient: 35 mins  Erin Hearing, ANP [facility] For Consults/Admissions - Flow  Manager - [phone] Office  [phone] Pager [phone]  On-Call/Text Page:  [email or web address]      password [email or web address]  06/12/2013, 8:52 AM   LOS: 10 days     Examined patient and reviewed assessment and plan with ANP Ebony Hail and agree with the above plan Discuss plan with patient and family who are extremely upset about patient having to have BKA.  Answered all questions of the patient and family  Direct Pt Care > 60 min

## 2013-06-12 NOTE — Interval H&P Note (Signed)
History and Physical Interval Note:  06/12/2013 6:20 AM  Steven Logan  has presented today for surgery, with the diagnosis of Right Leg Infection  The various methods of treatment have been discussed with the patient and family. After consideration of risks, benefits and other options for treatment, the patient has consented to  Right transtibial amputation as a surgical intervention .  [name] history has been reviewed, patient examined, no change in status, stable for surgery.  I have reviewed the patient's chart and labs.  Questions were answered to the patient's satisfaction.     Newt Minion

## 2013-06-12 NOTE — H&P (View-Only) (Signed)
Patient ID: Steven Logan, male   DOB: 11/13/1970, 43 y.o.   MRN: [id number] Patient with nausea and vomiting this morning. Discussed with the patient that we will proceed with a transtibial amputation tomorrow morning. Discussed that his condition is still guarded that this will heal.

## 2013-06-12 NOTE — Progress Notes (Signed)
Received report from night nurse. Pt is suppose to be going to surgery today. Pt's heparin drip has been stopped prior to shift change. Calling MD to clarify orders. Pt resting at this time.

## 2013-06-12 NOTE — Progress Notes (Signed)
Pt wishes to speak with chaplain and nurse called and left a msg. With chaplain. Dr. Sherral Hammers and Erin Hearing NP stated to keep heparin on hold at this time d/t pt going to surgery.

## 2013-06-12 NOTE — Progress Notes (Signed)
  [facility] Progress Note   Subjective: no complaints  Filed Vitals:   06/11/13 1930 06/11/13 2230 06/12/13 0000 06/12/13 0400  BP: 94/66 151/62 125/65 141/61  Pulse: 99 99 98 96  Temp: 98.4 F (36.9 C)  98.2 F (36.8 C) 98.2 F (36.8 C)  TempSrc: Oral  Axillary Axillary  Resp: 14  16 16   Height:      Weight:      SpO2: 99%  95% 97%   Exam: Alert, no distress No jvd Chest clear bilat LUA AVF patent RRR no MRG Abd soft, NTND RLE wrapped No LE edema Neuro is nf, Ox3  HD: [facility]WF [facility] 4.5h   111.5kg  2/2.5 Bath  500/800   Heparin 11000   LFA AVF Calcitriol 1.25ug   EPO 8000   Venofer s/p load completed 5/11       Assessment: 1 MSSA R ankle septic arthritis s/p I&D- on zosyn IV 2 RLE comp syndrome s/p fasciotomy 3 ESRD on HD 4 HPTH on renagel , vit D 5 HTN low BP's holding BP meds 6 Volume well below dry wt 7 Hyperkalemia improved  Plan- HD tomorrow    Kelly Splinter MD  pager [phone]    cell [phone]  06/12/2013, 7:22 AM     Recent Labs Lab 06/06/13 0659  06/11/13 0830 06/11/13 1100 06/11/13 1500 06/12/13 0450  NA 134*  < > 136*  --  137 140  K 4.5  < > 7.0* 4.1 4.9 5.1  CL 91*  < > 92*  --  94* 97  CO2 23  < > 20  --  25 26  GLUCOSE 203*  < > 165*  --  126* 111*  BUN 86*  < > 83*  --  41* 52*  CREATININE 7.47*  < > 9.41*  --  5.53* 6.76*  CALCIUM 8.5  < > 7.5*  --  8.0* 7.7*  PHOS 8.8*  --   --   --   --   --   < > = values in this interval not displayed.  Recent Labs Lab 06/06/13 0659  ALBUMIN 1.5*    Recent Labs Lab 06/10/13 2005 06/11/13 0830 06/12/13 0450  WBC 24.9* 24.1* 20.1*  HGB 6.4* 7.4* 7.5*  HCT 19.3* 21.9* 22.6*  MCV 86.2 83.9 85.3  PLT 486* 511* 518*   . docusate sodium  100 mg Oral BID  . feeding supplement (GLUCERNA SHAKE)  237 mL Oral TID BM  . insulin aspart  0-20 Units Subcutaneous TID WC  . insulin glargine  15 Units Subcutaneous QHS  . labetalol  200 mg Oral BID  . multivitamin  1 tablet Oral  QHS  . piperacillin-tazobactam (ZOSYN)  IV  2.25 g Intravenous Q8H  . polyethylene glycol  17 g Oral Daily  . sevelamer carbonate  800 mg Oral TID WC   . sodium chloride 10 mL/hr at 06/11/13 1300  . heparin Stopped (06/10/13 2310)   acetaminophen, acetaminophen, bisacodyl, HYDROcodone-acetaminophen, HYDROmorphone (DILAUDID) injection, metoCLOPramide (REGLAN) injection, metoCLOPramide, morphine injection, ondansetron (ZOFRAN) IV, ondansetron, oxyCODONE-acetaminophen

## 2013-06-12 NOTE — Progress Notes (Signed)
PT Cancellation Note  Patient Details Name: Steven Logan MRN: [id number] DOB: 06/30/1970   Cancelled Treatment:    Reason Eval/Treat Not Completed: Medical issues which prohibited therapy. Per chart review, pt going for trans-tibial amputation today. PT will defer services until after surgery. Will need new PT orders.   Jolyn Lent 06/12/2013, 9:12 AM  Jolyn Lent, PT, DPT Acute Rehabilitation Services Pager: [phone]

## 2013-06-12 NOTE — Transfer of Care (Signed)
Immediate Anesthesia Transfer of Care Note  Patient: Steven Logan  Procedure(s) Performed: Procedure(s): Transtibial Amputation (Right)  Patient Location: PACU  Anesthesia Type:General  Level of Consciousness: awake and alert   Airway & Oxygen Therapy: Patient Spontanous Breathing and Patient connected to nasal cannula oxygen  Post-op Assessment: Report given to PACU RN and Post -op Vital signs reviewed and stable  Post vital signs: Reviewed and stable  Complications: No apparent anesthesia complications

## 2013-06-12 NOTE — Anesthesia Procedure Notes (Signed)
Procedure Name: Intubation Date/Time: 06/12/2013 5:28 PM Performed by: Raphael Gibney T Pre-anesthesia Checklist: Patient identified, Timeout performed, Emergency Drugs available, Suction available and Patient being monitored Patient Re-evaluated:Patient Re-evaluated prior to inductionOxygen Delivery Method: Circle system utilized and Simple face mask Preoxygenation: Pre-oxygenation with 100% oxygen Intubation Type: IV induction Ventilation: Mask ventilation without difficulty Laryngoscope Size: Miller and 3 Grade View: Grade I Tube type: Oral Tube size: 7.5 mm Number of attempts: 1 Airway Equipment and Method: Patient positioned with wedge pillow and Stylet Placement Confirmation: ETT inserted through vocal cords under direct vision,  positive ETCO2 and breath sounds checked- equal and bilateral Secured at: 22 cm Tube secured with: Tape Dental Injury: Teeth and Oropharynx as per pre-operative assessment

## 2013-06-12 NOTE — Progress Notes (Signed)
Pt just left floor now for surgery. Called and notified central telemetry. Pt has family following OR staff.  Saline locked pt's triple lumen central line to left neck. Pt alert and oriented. Pt had pain medication once this am for severe pain rating a 9-10/10 in rt foot and back and pt had zofran for N&V per md orders but refused any pain medication since this am.

## 2013-06-12 NOTE — Anesthesia Preprocedure Evaluation (Signed)
Anesthesia Evaluation  Patient identified by MRN, date of birth, ID band Patient awake    Reviewed: Allergy & Precautions, H&P , NPO status , Patient's Chart, lab work & pertinent test results, reviewed documented beta blocker date and time   Airway Mallampati: II TM Distance: >3 FB Neck ROM: Limited  Mouth opening: Limited Mouth Opening  Dental  (+) Dental Advisory Given   Pulmonary shortness of breath, pneumonia -,          Cardiovascular hypertension, Pt. on medications and Pt. on home beta blockers     Neuro/Psych Depression    GI/Hepatic   Endo/Other  diabetes, Poorly Controlled  Renal/GU      Musculoskeletal   Abdominal   Peds  Hematology  (+) anemia ,   Anesthesia Other Findings   Reproductive/Obstetrics                           Anesthesia Physical Anesthesia Plan  ASA: III  Anesthesia Plan: General   Post-op Pain Management:    Induction: Intravenous  Airway Management Planned: Oral ETT  Additional Equipment:   Intra-op Plan:   Post-operative Plan: Extubation in OR and Possible Post-op intubation/ventilation  Informed Consent: I have reviewed the patients History and Physical, chart, labs and discussed the procedure including the risks, benefits and alternatives for the proposed anesthesia with the patient or authorized representative who has indicated his/her understanding and acceptance.   Dental advisory given  Plan Discussed with:   Anesthesia Plan Comments:         Anesthesia Quick Evaluation

## 2013-06-13 DIAGNOSIS — L98499 Non-pressure chronic ulcer of skin of other sites with unspecified severity: Secondary | ICD-10-CM

## 2013-06-13 DIAGNOSIS — I739 Peripheral vascular disease, unspecified: Secondary | ICD-10-CM

## 2013-06-13 DIAGNOSIS — S88119A Complete traumatic amputation at level between knee and ankle, unspecified lower leg, initial encounter: Secondary | ICD-10-CM

## 2013-06-13 LAB — BASIC METABOLIC PANEL
BUN: 67 mg/dL — ABNORMAL HIGH (ref 6–23)
CALCIUM: 7.5 mg/dL — AB (ref 8.4–10.5)
CO2: 24 meq/L (ref 19–32)
Chloride: 95 mEq/L — ABNORMAL LOW (ref 96–112)
Creatinine, Ser: 8.69 mg/dL — ABNORMAL HIGH (ref 0.50–1.35)
GFR calc Af Amer: 8 mL/min — ABNORMAL LOW (ref >90)
GFR calc non Af Amer: 7 mL/min — ABNORMAL LOW (ref >90)
GLUCOSE: 100 mg/dL — AB (ref 70–99)
POTASSIUM: 6 meq/L — AB (ref 3.7–5.3)
SODIUM: 137 meq/L (ref 137–147)

## 2013-06-13 LAB — CBC
HCT: 19.6 % — ABNORMAL LOW (ref 39.0–52.0)
HCT: 25.5 % — ABNORMAL LOW (ref 39.0–52.0)
HEMOGLOBIN: 8.5 g/dL — AB (ref 13.0–17.0)
Hemoglobin: 6.5 g/dL — CL (ref 13.0–17.0)
MCH: 29.1 pg (ref 26.0–34.0)
MCH: 29.4 pg (ref 26.0–34.0)
MCHC: 33.2 g/dL (ref 30.0–36.0)
MCHC: 33.3 g/dL (ref 30.0–36.0)
MCV: 87.9 fL (ref 78.0–100.0)
MCV: 88.2 fL (ref 78.0–100.0)
Platelets: 476 10*3/uL — ABNORMAL HIGH (ref 150–400)
Platelets: 465 10*3/uL — ABNORMAL HIGH (ref 150–400)
RBC: 2.23 MIL/uL — ABNORMAL LOW (ref 4.22–5.81)
RBC: 2.89 MIL/uL — AB (ref 4.22–5.81)
RDW: 15.7 % — ABNORMAL HIGH (ref 11.5–15.5)
RDW: 15 % (ref 11.5–15.5)
WBC: 17.4 10*3/uL — ABNORMAL HIGH (ref 4.0–10.5)
WBC: 17.1 10*3/uL — ABNORMAL HIGH (ref 4.0–10.5)

## 2013-06-13 LAB — GLUCOSE, CAPILLARY
GLUCOSE-CAPILLARY: 164 mg/dL — AB (ref 70–99)
Glucose-Capillary: 104 mg/dL — ABNORMAL HIGH (ref 70–99)
Glucose-Capillary: 120 mg/dL — ABNORMAL HIGH (ref 70–99)
Glucose-Capillary: 132 mg/dL — ABNORMAL HIGH (ref 70–99)

## 2013-06-13 LAB — PREPARE RBC (CROSSMATCH)

## 2013-06-13 LAB — POTASSIUM: Potassium: 4.3 mEq/L (ref 3.7–5.3)

## 2013-06-13 LAB — HEPARIN LEVEL (UNFRACTIONATED): HEPARIN UNFRACTIONATED: 0.31 [IU]/mL (ref 0.30–0.70)

## 2013-06-13 MED ORDER — GABAPENTIN 100 MG PO CAPS
100.0000 mg | ORAL_CAPSULE | Freq: Three times a day (TID) | ORAL | Status: DC
  Administered 2013-06-13 – 2013-06-14 (×4): 100 mg via ORAL
  Filled 2013-06-13 (×7): qty 1

## 2013-06-13 MED ORDER — HEPARIN (PORCINE) IN NACL 100-0.45 UNIT/ML-% IJ SOLN
2450.0000 [IU]/h | INTRAMUSCULAR | Status: DC
  Administered 2013-06-13: 2400 [IU]/h via INTRAVENOUS
  Administered 2013-06-14 (×2): 2300 [IU]/h via INTRAVENOUS
  Administered 2013-06-14: 2400 [IU]/h via INTRAVENOUS
  Administered 2013-06-15 (×2): 2450 [IU]/h via INTRAVENOUS
  Administered 2013-06-15: 2300 [IU]/h via INTRAVENOUS
  Administered 2013-06-16 – 2013-06-20 (×10): 2450 [IU]/h via INTRAVENOUS
  Filled 2013-06-13 (×20): qty 250

## 2013-06-13 MED ORDER — GABAPENTIN 100 MG PO CAPS: 100.0000 mg | ORAL_CAPSULE | Freq: Every day | ORAL | Status: DC

## 2013-06-13 MED ORDER — HYDROMORPHONE HCL PF 1 MG/ML IJ SOLN
INTRAMUSCULAR | Status: AC
  Administered 2013-06-13: 1 mg via INTRAVENOUS
  Filled 2013-06-13: qty 1

## 2013-06-13 MED ORDER — HYDROMORPHONE HCL PF 1 MG/ML IJ SOLN
INTRAMUSCULAR | Status: AC
  Filled 2013-06-13: qty 1

## 2013-06-13 MED ORDER — HYDROCODONE-ACETAMINOPHEN 5-325 MG PO TABS
ORAL_TABLET | ORAL | Status: AC
  Filled 2013-06-13: qty 2

## 2013-06-13 NOTE — Progress Notes (Signed)
Clinical Social Work Department BRIEF PSYCHOSOCIAL ASSESSMENT 06/13/2013  Patient:  Steven Logan, Steven Logan     Account Number:  [id number]     [place] date:  06/02/2013  Clinical Social Worker:  Freeman Caldron  Date/Time:  06/13/2013 04:00 PM  Referred by:  Physician  Date Referred:  06/13/2013 Referred for  SNF Placement   Other Referral:   Interview type:  Patient Other interview type:   Also spoke with pt's friend/POA Kendrick Fries.    PSYCHOSOCIAL DATA Living Status:  ALONE Admitted from facility:   Level of care:   Primary support name:  Noel Christmas [id number]) Primary support relationship to patient:  FRIEND Degree of support available:   Good--pt friend Kendrick Fries at bedside during assessment.    CURRENT CONCERNS Current Concerns  Post-Acute Placement   Other Concerns:    SOCIAL WORK ASSESSMENT / PLAN CSW introduced self and explained CSW role. PT coming to pt's room to work with him. Kendrick Fries is pt's friend and power of attorney. CSW assured Kendrick Fries that CSW will follow up and assist as needed if discharge will be to SNF. CSW noticed note from CIR and will also coordinate with them for most appropriate discharge setting. Pt states his brother provides support but he works and cannot be with him 24/7. CSW explained recommendation will likely be for someone to be with him 24/7. Will continue to follow case and do SNF back-up search if appropriate and keep pt/Yvonne updated.   Assessment/plan status:  Psychosocial Support/Ongoing Assessment of Needs Other assessment/ plan:   Information/referral to community resources:   SNF? CIR?    PATIENT'S/FAMILY'S RESPONSE TO PLAN OF CARE: Good--pt drowsy during conversation, and pt's friend Kendrick Fries understanding of CSW role and possibility of SNF recommendation. CSW provided active listening as Kendrick Fries expressed concern for pt's recent hospitalization and feeling like the ED did not provide adequate care and pt had to ultimately have his leg  amputated.       Ky Barban, MSW, [facility] Clinical Social Worker [phone]

## 2013-06-13 NOTE — Progress Notes (Signed)
ANTICOAGULATION CONSULT NOTE - Follow Up Consult  Pharmacy Consult for heparin Indication: RUE DVT  No Known Allergies  Assessment: [name] on heparin for RUE DVT.    Resuming heparin post - op at low dose Has had bleeding issues in the past  Goal of Therapy:  Heparin level 0.3-0.5 units/ml Monitor platelets by anticoagulation protocol: Yes   Plan:  1. Heparin drip at 2400 units / hr 2. Heparin level 8 hours after heparin resumed 3. Daily labs    Patient Measurements: Height: 6\' 1"  (185.4 cm) Weight: 212 lb 4.9 oz (96.3 kg) IBW/kg (Calculated) : 79.9 Heparin Dosing Weight: 103 kg  Vital Signs: Temp: 98.1 F (36.7 C) (05/20 1311) Temp src: Oral (05/20 1311) BP: 158/89 mmHg (05/20 1311) Pulse Rate: 111 (05/20 1311)  Labs:  Recent Labs  06/10/13 1510  06/11/13 0830 06/11/13 1500 06/12/13 0450 06/13/13 0500  HGB  --   < > 7.4*  --  7.5* 6.5*  HCT  --   < > 21.9*  --  22.6* 19.6*  PLT  --   < > 511*  --  518* 476*  HEPARINUNFRC 0.32  --   --   --   --   --   CREATININE  --   < > 9.41* 5.53* 6.76* 8.69*  < > = values in this interval not displayed.  Estimated Creatinine Clearance: 13.4 ml/min (by C-G formula based on Cr of 8.69).   Thank you. Anette Guarneri, PharmD [phone]   06/13/2013 2:26 PM

## 2013-06-13 NOTE — Progress Notes (Signed)
Rehab Admissions Coordinator Note:  Patient was screened by Quentin Mulling Felix Meras for appropriateness for an Inpatient Acute Rehab Consult.  At this time, we are recommending Inpatient Rehab consult.  Courtlynn Holloman L Tabria Steines, PT 06/13/2013, 4:40 PM  I can be reached at [phone].

## 2013-06-13 NOTE — Progress Notes (Signed)
Hemodialysis-NP notified of high K=6.0 this am. Pt is to receive 2 units prbcs as well. Order to repeat K lab after 1 hour of HD on 2K. Continue to monitor pt

## 2013-06-13 NOTE — Progress Notes (Signed)
Pt's hgb is 6.5, Dr Thereasa Solo  Has been paged  Through [email or web address], awaiting a call back, report has been given to the on-coming rn to follow- up----Dedra Matsuo,rn

## 2013-06-13 NOTE — Progress Notes (Signed)
Inpatient Rehabilitation  I met with the patient and his best friend [name] at the bedside to discuss post acute rehab options including CIR.  I provided a rehab booklet and answered their questions.  Pt. and [name] would like for me to request insurance authorization for potential CIR admit when more medically ready. My co-worker Danne Baxter  [phone])  will follow up tomorrow and  pursue authorization when pt. medically ready.  Please call if questions.  [place] Admissions Coordinator Cell [phone] Office [phone]

## 2013-06-13 NOTE — Procedures (Signed)
Patient was seen on dialysis and the procedure was supervised.  BFR 500  Via AVF BP is  150/76.   Patient appears to be tolerating treatment well  Louis Meckel 06/13/2013

## 2013-06-13 NOTE — Progress Notes (Signed)
Patient ID: Steven Logan, male   DOB: [birth year], 43 y.o.   MRN: [id number] Postoperative day 1 right transtibial amputation. Patient is comfortable this morning. Start physical therapy progressive ambulation. Patient may require inpatient or outpatient rehabilitation. Orders written. Keep dressing clean dry and intact. I will followup in the office in one week. Discontinue antibiotics at time of discharge

## 2013-06-13 NOTE — Progress Notes (Signed)
Subjective:   Trying to cope with amputation, appetite coming back but having a lot of pain.   Objective Filed Vitals:   06/13/13 0930 06/13/13 1000 06/13/13 1030 06/13/13 1110  BP: 171/83 150/76 170/84 182/100  Pulse: 112 109 111 105  Temp:    98.2 F (36.8 C)  TempSrc:    Oral  Resp:    16  Height:      Weight:      SpO2:    100%   Physical Exam General: Drowsy, easily arousable. No acute distress. Heart: RRR Lungs: CTA. unlabored Abdomen: soft, nontender +BS Extremities: R BKA in post op dressing. LLE trace edema Dialysis Access:  L AVF patent on HD  HD: [facility]  4.5h 111.5kg 2/2.5 Bath 500/800 Heparin 11000 LFA AVF  Calcitriol 1.25ug EPO 8000 Venofer s/p load completed 5/11   Assessment/Plan: 1. 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 ESRD - MWF @ [facility]. HD today. Tolerating well. Will need much lower edw at DC 3. RUA DVT- heparin gtt per pharm 3. Anemia - hgb 6.5 post op 2u RBC 5/20, also 1 u 5/18 4. Secondary hyperparathyroidism - Ca+ 7.5. Cont calcitriol renvela 5. HTN/volume - 180/98, below edw. Home BP med Labetalol on hold, watch BP 6. Nutrition -  Clears, Nepro, mulitivit. Poor appetite 7. Dispo- likely will need rehab/SNF  Shelle Iron, NP [place] [phone] 06/13/2013,11:23 AM  LOS: 11 days   Pt seen, examined and agree w A/P as above.  Kelly Splinter MD pager [phone]    cell [phone] 06/13/2013, 12:26 PM   Additional Objective Labs: Basic Metabolic Panel:  Recent Labs Lab 06/11/13 1500 06/12/13 0450 06/13/13 0500 06/13/13 0935  NA 137 140 137  --   K 4.9 5.1 6.0* 4.3  CL 94* 97 95*  --   CO2 25 26 24   --   GLUCOSE 126* 111* 100*  --   BUN 41* 52* 67*  --   CREATININE 5.53* 6.76* 8.69*  --   CALCIUM 8.0* 7.7* 7.5*  --    Liver Function Tests: No results found for this basename: AST, ALT, ALKPHOS, BILITOT, PROT, ALBUMIN,  in the last 168 hours No results  found for this basename: LIPASE, AMYLASE,  in the last 168 hours CBC:  Recent Labs Lab 06/10/13 1500 06/10/13 2005 06/11/13 0830 06/12/13 0450 06/13/13 0500  WBC 25.0* 24.9* 24.1* 20.1* 17.4*  HGB 6.6* 6.4* 7.4* 7.5* 6.5*  HCT 20.2* 19.3* 21.9* 22.6* 19.6*  MCV 86.3 86.2 83.9 85.3 87.9  PLT 513* 486* 511* 518* 476*   Blood Culture    Component Value Date/Time   SDES BLOOD 06/10/2013 0629   SPECREQUEST BOTTLES DRAWN AEROBIC ONLY LEFT INTERNAL JUGULAR TLC DISTAL PORT  10CC 06/10/2013 0629   CULT  Value:        BLOOD CULTURE RECEIVED NO GROWTH TO DATE CULTURE WILL BE HELD FOR 5 DAYS BEFORE ISSUING A FINAL NEGATIVE REPORT Performed at [facility] 06/10/2013 0629   REPTSTATUS PENDING 06/10/2013 0629    Cardiac Enzymes:  Recent Labs Lab 06/07/13 1623 06/07/13 2045 06/08/13 0318  TROPONINI <0.30 <0.30 <0.30   CBG:  Recent Labs Lab 06/12/13 1228 06/12/13 1540 06/12/13 1831 06/12/13 2016 06/13/13 0816  GLUCAP 158* 132* 114* 152* 104*   Iron Studies: No results found for this basename: IRON, TIBC, TRANSFERRIN, FERRITIN,  in the last 72 hours @lablastinr3 @ Studies/Results: No results found. Medications: . sodium  chloride 10 mL/hr at 06/11/13 1300  . heparin Stopped (06/10/13 2310)   . docusate sodium  100 mg Oral BID  . feeding supplement (GLUCERNA SHAKE)  237 mL Oral TID BM  . gabapentin  300 mg Oral TID  . heparin  20 Units/kg Dialysis Once in dialysis  . HYDROmorphone      . insulin aspart  0-20 Units Subcutaneous TID WC  . insulin glargine  15 Units Subcutaneous QHS  . multivitamin  1 tablet Oral QHS  . piperacillin-tazobactam (ZOSYN)  IV  2.25 g Intravenous Q8H  . polyethylene glycol  17 g Oral Daily  . sevelamer carbonate  800 mg Oral TID WC

## 2013-06-13 NOTE — Consult Note (Signed)
Physical Medicine and Rehabilitation Consult Reason for Consult: Right transtibial amputation Referring Physician: [facility]   HPI: Steven Logan is a 43 y.o. right-handed male with history of diabetes mellitus with peripheral neuropathy, pancreatitis, end-stage renal disease with hemodialysis and gout. Patient lives alone was independent until 2 weeks ago. Admitted 06/03/2013 with recent right ankle swelling felt to be gout was started on prednisone without improvement. Patient bumped his leg and the leg wound open and started to ooze pus. MRI of the ankle showed gas containing abscess and cellulitis. Placed on broad-spectrum antibiotics. Underwent excision of fascia deep and superficial lateral compartments right leg with irrigation and debridement 06/04/2013 and 06/06/2013 per Dr. Sharol Given. Intraoperative findings showed massive necrotizing fasciitis and purulent abscesses. Culture revealed MSSA from wound operative site. Limb was not felt to be salvageable and underwent transtibial amputation 06/12/2013 per Dr. Sharol Given. Postoperative pain management. Noted acute nonocclusive DVT right upper extremity with jugular, subclavian, axillary and brachial veins involved. Currently maintained on intravenous heparin. Acute blood loss anemia 6.5 plan transfusion. Physical and occupational therapy evaluations are pending. M.D. has requested physical medicine rehabilitation consult  Patient seen in dialysis. Appears groggy. States that he has right foot pain more than right stump pain  Review of Systems  Respiratory: Positive for shortness of breath.   Gastrointestinal: Positive for constipation.  Musculoskeletal: Positive for joint pain and myalgias.  Psychiatric/Behavioral: Positive for depression.  All other systems reviewed and are negative.  Past Medical History  Diagnosis Date  . Eczema   . Morbid obesity   . Gout   . HTN (hypertension)   . Dyslipidemia   . Pulmonary embolism     3  in  lungs   at  one time...  . Abscess     great toe  . Pancreatitis   . Arthritis   . Depression   . Diabetes mellitus   . Anemia   . Pneumonia     2-3 years ago  . Shortness of breath     ?? chest  cold he has now.  10/8- no SOB  . Renal hypertension     Hemo started  Sept 2014- MWF  . Chronic kidney disease     esrd   Past Surgical History  Procedure Laterality Date  . None    . Av fistula placement Left 04/07/2012    Procedure: ARTERIOVENOUS (AV) FISTULA CREATION;  Surgeon: Angelia Mould, MD;  Location: [place];  Service: Vascular;  Laterality: Left;  . Ligation of competing branches of arteriovenous fistula Left 09/19/2012    Procedure: LIGATION OF COMPETING BRANCHES OF LEFT ARM ARTERIOVENOUS FISTULA; Vein angioplasty;  Surgeon: Angelia Mould, MD;  Location: [place];  Service: Vascular;  Laterality: Left;  . Insertion of dialysis catheter Right 09/19/2012    Procedure: INSERTION OF DIALYSIS CATHETER;  Surgeon: Angelia Mould, MD;  Location: [place];  Service: Vascular;  Laterality: Right;  . I&d extremity Right 06/02/2013    Procedure: IRRIGATION AND DEBRIDEMENT ARTHROSCOPIC RIGHT ANKLE;  Surgeon: Augustin Schooling, MD;  Location: [place];  Service: Orthopedics;  Laterality: Right;  . I&d extremity Right 06/04/2013    Procedure: IRRIGATION AND DEBRIDEMENT RIGHT FOOT/ANKLE;  Surgeon: Newt Minion, MD;  Location: [place];  Service: Orthopedics;  Laterality: Right;  . I&d extremity Right 06/06/2013    Procedure: IRRIGATION AND DEBRIDEMENT EXTREMITY;  Surgeon: Newt Minion, MD;  Location: [place];  Service: Orthopedics;  Laterality: Right;   Family  History  Problem Relation Age of Onset  . Stroke Mother   . Diabetes Mother   . Hypertension Mother   . Cancer    . Coronary artery disease    . Hypertension Father   . Colon cancer Maternal Grandmother 78  . Hypertension Brother    Social History:  reports that he has never smoked. He has never used smokeless tobacco. He reports that  he does not drink alcohol or use illicit drugs. Allergies: No Known Allergies Medications Prior to Admission  Medication Sig Dispense Refill  . labetalol (NORMODYNE) 200 MG tablet Take 200 mg by mouth 2 (two) times daily.      Marland Kitchen oxyCODONE-acetaminophen (PERCOCET/ROXICET) 5-325 MG per tablet Take 1-2 tablets by mouth every 4 (four) hours as needed for severe pain.  15 tablet  0  . sevelamer carbonate (RENVELA) 800 MG tablet Take 800 mg by mouth 3 (three) times daily with meals.        Home: Home Living Family/patient expects to be discharged to:: Private residence Living Arrangements: Alone Available Help at Discharge: Family;Available PRN/intermittently Type of Home: Apartment Home Access: Stairs to enter Entrance Stairs-Number of Steps: 1 Home Layout: One level Home Equipment: Crutches  Functional History: Prior Function Level of Independence: Needs assistance;Independent with assistive device(s) Gait / Transfers Assistance Needed: Using crutches Comments: Pt states his brother was staying with him and assisting him around the house as needed.  Functional Status:  Mobility: Bed Mobility Overal bed mobility: Needs Assistance Bed Mobility: Supine to Sit Supine to sit: Min assist;HOB elevated General bed mobility comments: Very laborious for pt to scoot his hips backwards on the bed. Pt originally sitting on very edge of bed with LE's extended and goal was to shift hips to center of bed. Bed pad used to assist with scooting.  Transfers Overall transfer level: Needs assistance Equipment used: Rolling walker (2 wheeled) Transfers: Sit to/from [facility] Sit to Stand: Mod assist Stand pivot transfers: Mod assist General transfer comment: Pt refusing any OOB at this time.       ADL:    Cognition: Cognition Overall Cognitive Status: Within Functional Limits for tasks assessed Orientation Level: Oriented to person;Oriented to place;Oriented to  situation Cognition Arousal/Alertness: Lethargic;Suspect due to medications Behavior During Therapy: Flat affect Overall Cognitive Status: Within Functional Limits for tasks assessed  Blood pressure 144/57, pulse 103, temperature 98.1 F (36.7 C), temperature source Oral, resp. rate 11, height 6\' 1"  (1.854 m), weight 96.2 kg (212 lb 1.3 oz), SpO2 100.00%. Physical Exam  Vitals reviewed. Constitutional:  43 year old obese lethargic male  HENT:  Head: Normocephalic.  Eyes: EOM are normal.  Neck: Normal range of motion. Neck supple. No thyromegaly present.  Cardiovascular: Normal rate and regular rhythm.   Respiratory: Effort normal and breath sounds normal. No respiratory distress.  GI: Soft. Bowel sounds are normal. He exhibits no distension.  Neurological:  Patient was lethargic but arousable. He was able to provide his name and age.  Skin:  Amputation site is dressed appropriately tender   motor strength good grip strength left hand otherwise not tested secondary to dialysis on going Right upper extremity 5/5 deltoid bicep triceps grip Right hip flexor 3+/5 Left lower extremity pre-hip flexor knee extensor 2 minus ankle dorsiflexor plantar flexor Difficulty with manual muscle testing secondary to somnolent. Sensory could not assessed secondary to somnolent Musculoskeletal: Right hand has swelling and tenderness at the PIPs and DIPs no erythema  Results for orders placed during the hospital  encounter of 06/02/13 (from the past 24 hour(s))  GLUCOSE, CAPILLARY     Status: Abnormal   Collection Time    06/12/13  7:51 AM      Result Value Ref Range   Glucose-Capillary 133 (*) 70 - 99 mg/dL   Comment 1 Notify RN    GLUCOSE, CAPILLARY     Status: Abnormal   Collection Time    06/12/13 12:28 PM      Result Value Ref Range   Glucose-Capillary 158 (*) 70 - 99 mg/dL  GLUCOSE, CAPILLARY     Status: Abnormal   Collection Time    06/12/13  3:40 PM      Result Value Ref Range    Glucose-Capillary 132 (*) 70 - 99 mg/dL   Comment 1 Documented in Chart    GLUCOSE, CAPILLARY     Status: Abnormal   Collection Time    06/12/13  6:31 PM      Result Value Ref Range   Glucose-Capillary 114 (*) 70 - 99 mg/dL   Comment 1 Notify RN    GLUCOSE, CAPILLARY     Status: Abnormal   Collection Time    06/12/13  8:16 PM      Result Value Ref Range   Glucose-Capillary 152 (*) 70 - 99 mg/dL  CBC     Status: Abnormal   Collection Time    06/13/13  5:00 AM      Result Value Ref Range   WBC 17.4 (*) 4.0 - 10.5 K/uL   RBC 2.23 (*) 4.22 - 5.81 MIL/uL   Hemoglobin 6.5 (*) 13.0 - 17.0 g/dL   HCT 19.6 (*) 39.0 - 52.0 %   MCV 87.9  78.0 - 100.0 fL   MCH 29.1  26.0 - 34.0 pg   MCHC 33.2  30.0 - 36.0 g/dL   RDW 15.7 (*) 11.5 - 15.5 %   Platelets 476 (*) 150 - 400 K/uL  BASIC METABOLIC PANEL     Status: Abnormal   Collection Time    06/13/13  5:00 AM      Result Value Ref Range   Sodium 137  137 - 147 mEq/L   Potassium 6.0 (*) 3.7 - 5.3 mEq/L   Chloride 95 (*) 96 - 112 mEq/L   CO2 24  19 - 32 mEq/L   Glucose, Bld 100 (*) 70 - 99 mg/dL   BUN 67 (*) 6 - 23 mg/dL   Creatinine, Ser 8.69 (*) 0.50 - 1.35 mg/dL   Calcium 7.5 (*) 8.4 - 10.5 mg/dL   GFR calc non Af Amer 7 (*) >90 mL/min   GFR calc Af Amer 8 (*) >90 mL/min   No results found.  Assessment/Plan: Diagnosis: Right BKA for necrotizing fasciitis 1. Does the need for close, 24 hr/day medical supervision in concert with the patient's rehab needs make it unreasonable for this patient to be served in a less intensive setting? Yes 2. Co-Morbidities requiring supervision/potential complications: And stage renal disease, anemia, encephalopathy associated with infection 3. Due to bladder management, bowel management, safety, skin/wound care, disease management, medication administration, pain management and patient education, does the patient require 24 hr/day rehab nursing? Yes 4. Does the patient require coordinated care of a  physician, rehab nurse, PT (1-2 hrs/day, 5 days/week), OT (1-2 hrs/day, 5 days/week) and SLP (0.5-1 hrs/day, 5 days/week) to address physical and functional deficits in the context of the above medical diagnosis(es)? Yes Addressing deficits in the following areas: balance, endurance, locomotion, strength, transferring, bowel/bladder  control, bathing, dressing, feeding, grooming, toileting and cognition 5. Can the patient actively participate in an intensive therapy program of at least 3 hrs of therapy per day at least 5 days per week? Potentially 6. The potential for patient to make measurable gains while on inpatient rehab is good 7. Anticipated functional outcomes upon discharge from inpatient rehab are supervision  with PT, supervision with OT, supervision with SLP. 8. Estimated rehab length of stay to reach the above functional goals is: 10-12 days 9. Does the patient have adequate social supports to accommodate these discharge functional goals? Potentially 10. Anticipated D/C setting: Home 11. Anticipated post D/C treatments: [place] therapy 12. Overall Rehab/Functional Prognosis: good  RECOMMENDATIONS: This patient's condition is appropriate for continued rehabilitative care in the following setting: CIR Patient has agreed to participate in recommended program. Potentially Note that insurance prior authorization may be required for reimbursement for recommended care.  Comment:     06/13/2013

## 2013-06-13 NOTE — Progress Notes (Signed)
[place] TEAM 1 - Stepdown/ICU TEAM Progress Note  Steven Logan [id number] DOB: February 19, 1970 DOA: 06/02/2013 PCP: Tivis Ringer, MD  Admit HPI / Brief Narrative: 43 y.o. M w/ a history of Morbid obesity; Gout; HTN; Dyslipidemia; Pulmonary embolism; Pancreatitis; Arthritis; Depression; Diabetes mellitus; and chronic anemia who presented with right ankle swelling for 2 weeks.  At first it was diagnosed as gout and he was started on prednisone without improvement. Patient reported that he later bumped his leg and a wound opened up and started to ooze pus. Patient subsequently re-presented to the ER.  An MRI of the ankle revealed gas-containing abscess and cellulitis. Orthopedics took him emergently to the OR for debridement. Pt also reported right arm swelling for 2 days.    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 -Ortho recommends continuing current anbxs until dc -will need extensive rehab after dc -start Neurontin for presumed neuropathic etiology to pain  Acute nonocclusive DVT RUE -jugular, subclavian, axillary, and brachial veins involved  - avoid access/blood draws in R arm   - recent removal of HD vascath from right IJ 5/10 (had been in place since Aug 2014) -resumed Heparin post op   Anemia in chronic kidney disease / acute postoperative blood loss anemia  -Follow trend - did experience extensive bleeding from wound 5/16 morning per RN report (soaked through dressing and was dripping on bed/floor) necessitating temporary stop of heparin  - transfuse as needed to keep Hgb 7.0 or >  - amputation should correct ongoing loss although did experience blood loss with OR so will transfuse 2  units today and follow   Situational Depression - emotional support given and follow up chaplain visit requested 5/19  Poor venous access -has CL left IJ  Encephalopathy, toxic -Mental status waxing and waining  - at this point seems due to pain meds and his usual AMS post HD  End stage renal disease on HD M/W/F per Renal  Hypertension BP stable at present   DM / Hyperglycemia CBG improved - follow w/o change today - A1c 7.0 - does not appear to be on medical tx at home   Obesity - morbid - Body mass index is 28.02 kg/(m^2).  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 rate 20, height 6\' 1"  (1.854 m), weight 212 lb 4.9 oz (96.3 kg), SpO2 98.00%.  Intake/Output Summary (Last 24 hours) at 06/13/13 1405 Last data filed at 06/13/13 1220  Gross per 24 hour  Intake   1455 ml  Output   2416 ml  Net   -961 ml   Exam: General: No acute respiratory distress-seems sedate today during HD Lungs: Clear to auscultation bilaterally without wheezes or crackles, 2L Cardiovascular: Regular rate and rhythm without murmur gallop or rub  Abdomen: Nontender, soft, bowel sounds positive, no rebound, no ascites,  no appreciable mass Extremities: RUE with improved edema extending from hand to shoulder; RLE dressed post op  Data Reviewed: Basic Metabolic Panel:  Recent Labs Lab 06/10/13 0505 06/11/13 0830 06/11/13 1100 06/11/13 1500 06/12/13 0450 06/13/13 0500 06/13/13 0935  NA 135* 136*  --  137 140 137  --   K 5.8* 7.0* 4.1 4.9 5.1 6.0* 4.3    CL 95* 92*  --  94* 97 95*  --   CO2 22 20  --  25 26 24   --   GLUCOSE 136* 165*  --  126* 111* 100*  --   BUN 60* 83*  --  41* 52* 67*  --   CREATININE 7.46* 9.41*  --  5.53* 6.76* 8.69*  --   CALCIUM 7.7* 7.5*  --  8.0* 7.7* 7.5*  --    Liver Function Tests: No results found for this basename: AST, ALT, ALKPHOS, BILITOT, PROT, ALBUMIN,  in the last 168 hours CBC:  Recent Labs Lab 06/10/13 1500 06/10/13 2005 06/11/13 0830 06/12/13 0450 06/13/13 0500  WBC 25.0* 24.9* 24.1* 20.1* 17.4*  HGB 6.6* 6.4* 7.4* 7.5* 6.5*  HCT 20.2* 19.3* 21.9* 22.6* 19.6*  MCV 86.3 86.2 83.9 85.3 87.9  PLT 513* 486* 511* 518* 476*   Cardiac Enzymes:  Recent Labs Lab 06/07/13 1623 06/07/13 2045 06/08/13 0318  TROPONINI <0.30 <0.30 <0.30   CBG:  Recent Labs Lab 06/12/13 1540 06/12/13 1831 06/12/13 2016 06/13/13 0816 06/13/13 1311  GLUCAP 132* 114* 152* 104* 120*    Recent Results (from the past 240 hour(s))  CULTURE, ROUTINE-ABSCESS     Status: None   Collection Time    06/04/13  5:59 PM      Result Value Ref Range Status   Specimen Description ABSCESS ANKLE RIGHT   Final   Special Requests PATIENT ON FOLLOWING VANCOMYCIN, UNASYN   Final   Gram Stain     Final   Value: ABUNDANT WBC PRESENT,BOTH PMN AND MONONUCLEAR     NO SQUAMOUS EPITHELIAL CELLS SEEN     FEW GRAM POSITIVE COCCI     IN PAIRS IN CLUSTERS     Performed at [facility]   Culture     Final   Value: MODERATE STAPHYLOCOCCUS AUREUS     Note: RIFAMPIN AND GENTAMICIN SHOULD NOT BE USED AS SINGLE DRUGS FOR TREATMENT OF STAPH INFECTIONS. This organism is presumed to be Clindamycin resistant based on detection of inducible Clindamycin resistance.     Performed at [facility]   Report Status 06/07/2013 FINAL   Final   Organism ID, Bacteria STAPHYLOCOCCUS AUREUS   Final  ANAEROBIC CULTURE     Status: None   Collection Time    06/04/13  5:59 PM      Result Value Ref Range Status   Specimen Description  ABSCESS ANKLE RIGHT   Final   Special Requests PATIENT ON FOLLOWING VANCOMYCIN, UNASYN   Final   Gram Stain     Final   Value: ABUNDANT WBC PRESENT,BOTH PMN AND MONONUCLEAR     NO SQUAMOUS EPITHELIAL CELLS SEEN     FEW GRAM POSITIVE COCCI     IN PAIRS IN CLUSTERS     Performed at [facility]   Culture     Final   Value: NO ANAEROBES ISOLATED     Performed at [facility]   Report Status 06/09/2013 FINAL   Final  ANAEROBIC CULTURE     Status: None   Collection Time  06/04/13  6:07 PM      Result Value Ref Range Status   Specimen Description ABSCESS RIGHT LEG   Final   Special Requests     Final   Value: PATIENT ON FOLLOWING VANCOMYCIN UNASYN CULTURES FROM RIGHT CALF   Gram Stain     Final   Value: FEW WBC PRESENT,BOTH PMN AND MONONUCLEAR     NO SQUAMOUS EPITHELIAL CELLS SEEN     FEW GRAM POSITIVE COCCI     IN PAIRS     Performed at [facility]   Culture     Final   Value: NO ANAEROBES ISOLATED     Performed at [facility]   Report Status 06/09/2013 FINAL   Final  CULTURE, ROUTINE-ABSCESS     Status: None   Collection Time    06/04/13  6:07 PM      Result Value Ref Range Status   Specimen Description ABSCESS RIGHT LEG   Final   Special Requests     Final   Value: PATIENT ON FOLLOWING VANCOMYCIN UNASYN CULTURES FROM RIGHT CALF   Gram Stain     Final   Value: RARE WBC PRESENT,BOTH PMN AND MONONUCLEAR     NO SQUAMOUS EPITHELIAL CELLS SEEN     FEW GRAM POSITIVE COCCI     IN PAIRS     Performed at [facility]   Culture     Final   Value: MODERATE STAPHYLOCOCCUS AUREUS     Note: SUSCEPTIBILITIES PERFORMED ON PREVIOUS CULTURE WITHIN THE LAST 5 DAYS.     Performed at [facility]   Report Status 06/07/2013 FINAL   Final  CULTURE, BLOOD (ROUTINE X 2)     Status: None   Collection Time    06/10/13  6:29 AM      Result Value Ref Range Status   Specimen Description BLOOD   Final   Special Requests     Final   Value: BOTTLES  DRAWN AEROBIC ONLY LEFT INTERNAL JUGULAR TLC DISTAL PORT  10CC   Culture  Setup Time     Final   Value: 06/10/2013 14:40     Performed at [facility]   Culture     Final   Value:        BLOOD CULTURE RECEIVED NO GROWTH TO DATE CULTURE WILL BE HELD FOR 5 DAYS BEFORE ISSUING A FINAL NEGATIVE REPORT     Performed at [facility]   Report Status PENDING   Incomplete     Studies:  Recent x-ray studies have been reviewed in detail by the Attending Physician  Scheduled Meds:  Scheduled Meds: . docusate sodium  100 mg Oral BID  . feeding supplement (GLUCERNA SHAKE)  237 mL Oral TID BM  . gabapentin  300 mg Oral TID  . HYDROmorphone      . insulin aspart  0-20 Units Subcutaneous TID WC  . insulin glargine  15 Units Subcutaneous QHS  . multivitamin  1 tablet Oral QHS  . piperacillin-tazobactam (ZOSYN)  IV  2.25 g Intravenous Q8H  . polyethylene glycol  17 g Oral Daily  . sevelamer carbonate  800 mg Oral TID WC    Time spent on care of this patient: 25 mins  Erin Hearing, ANP [facility] For Consults/Admissions - Flow Manager - [phone] Office  [phone] Pager [phone]  On-Call/Text Page:      [email or web address]      password [facility]  06/13/2013, 2:05 PM   LOS:  11 days    I have personally examined this patient and reviewed the entire database. I have reviewed the above note, made any necessary editorial changes, and agree with its content.  Cherene Altes, MD [facility]

## 2013-06-13 NOTE — Progress Notes (Signed)
Physical Therapy Re-Evaluation Patient Details Name: Steven Logan MRN: [id number] DOB: [birth year] Today's Date: 06/13/2013   History of Present Illness  Pt is a 43 y/o male admitted with gas-containing abscess and cellulitis in RLE. Pt is currently NWB and another I&D is planned for 06/06/13. On 06/12/2013 pt to OR for R transtibial amputation.   Clinical Impression  Pt with decreased functional independence following transtibial amputation. Pt required use of Sara Plus lift to assist pt into standing and transition to the recliner chair. Goals were downgraded to reflect new functional status and frequency upgraded for possible CIR admission. Will continue to progress per POC.     Follow Up Recommendations CIR    Equipment Recommendations  Rolling walker with 5" wheels;3in1 (PT)    Recommendations for Other Services Rehab consult     Precautions / Restrictions Precautions Precautions: Fall Restrictions Weight Bearing Restrictions: Yes RLE Weight Bearing: Non weight bearing      Mobility  Bed Mobility Overal bed mobility: Needs Assistance;+2 for physical assistance Bed Mobility: Supine to Sit     Supine to sit: Mod assist;+2 for physical assistance     General bed mobility comments: VC's for sequencing and technique. Hand-over-hand assist to reach for bed rails for support. Bed pad used to assist pt in scooting hips towards EOB. Heavy +2 to elevate trunk to full sitting position.   Transfers Overall transfer level: Needs assistance Equipment used: Rolling walker (2 wheeled) Licensed conveyancer) Transfers: Sit to/from [employer] to Stand:  Clarise Cruz Plus) Stand pivot transfers:  Clarise Cruz Plus)       General transfer comment: Multiple attempts to clear hips from bed with RW and +2 max assist. Pt appeared to not be participating in the transfer, with weight obviously off of UE's when he was supposed to be pushing to stand. Clarise Cruz Plus used to assist pt into  standing with specific cueing for active use of UE's to push up with, as well as for pushing through LLE to stand. x2 attempts required, and pt was able to successfully stand and be moved to sit in the chair. Once standing, pt was able to hold for ~30 seconds.   Ambulation/Gait                Stairs            Wheelchair Mobility    Modified Rankin (Stroke Patients Only)       Balance Overall balance assessment: Needs assistance Sitting-balance support: Feet supported;No upper extremity supported Sitting balance-Leahy Scale: Fair     Standing balance support: Bilateral upper extremity supported Standing balance-Leahy Scale: Zero                               Pertinent Vitals/Pain Pt reports increased pain with sitting in recliner, and pt was repositioned for comfort. Pillow was placed under distal end of residual limb and pt was educated to keep R knee straight while resting.     Home Living                        Prior Function                 Hand Dominance        Extremity/Trunk Assessment  Communication      Cognition Arousal/Alertness: Lethargic;Suspect due to medications Behavior During Therapy: Flat affect Overall Cognitive Status: Impaired/Different from baseline Area of Impairment: Orientation Orientation Level: Disoriented to;Situation             General Comments: Pt asking where the rest of his leg is. States multiple times throughout session that he didn't know they were going to take that much of his leg.     General Comments General comments (skin integrity, edema, etc.): very supportive family, however somewhat counterproductive with them present in room for session. Pt requires little distraction and direct cueing.     Exercises        Assessment/Plan    PT Assessment    PT Diagnosis     PT Problem List    PT Treatment Interventions     PT Goals (Current  goals can be found in the Care Plan section) Acute Rehab PT Goals Patient Stated Goal: To walk again PT Goal Formulation: With patient Time For Goal Achievement: 06/20/13 Potential to Achieve Goals: Good    Frequency Min 4X/week   Barriers to discharge        Co-evaluation               End of Session Equipment Utilized During Treatment: Gait belt Activity Tolerance: Patient limited by lethargy Patient left: in chair;with family/visitor present Nurse Communication: Mobility status         Time: [id number] PT Time Calculation (min): 42 min   Charges:   PT Evaluation $PT Re-evaluation: 1 Procedure PT Treatments $Therapeutic Activity: 23-37 mins   PT G CodesJolyn Lent 06/13/2013, 4:26 PM  Jolyn Lent, PT, DPT Acute Rehabilitation Services Pager: [phone]

## 2013-06-14 DIAGNOSIS — J811 Chronic pulmonary edema: Secondary | ICD-10-CM

## 2013-06-14 DIAGNOSIS — J9600 Acute respiratory failure, unspecified whether with hypoxia or hypercapnia: Secondary | ICD-10-CM

## 2013-06-14 LAB — BASIC METABOLIC PANEL
BUN: 32 mg/dL — ABNORMAL HIGH (ref 6–23)
CALCIUM: 7.6 mg/dL — AB (ref 8.4–10.5)
CO2: 26 mEq/L (ref 19–32)
Chloride: 96 mEq/L (ref 96–112)
Creatinine, Ser: 6.26 mg/dL — ABNORMAL HIGH (ref 0.50–1.35)
GFR calc Af Amer: 11 mL/min — ABNORMAL LOW (ref >90)
GFR calc non Af Amer: 10 mL/min — ABNORMAL LOW (ref >90)
Glucose, Bld: 110 mg/dL — ABNORMAL HIGH (ref 70–99)
Potassium: 4.7 mEq/L (ref 3.7–5.3)
SODIUM: 137 meq/L (ref 137–147)

## 2013-06-14 LAB — GLUCOSE, CAPILLARY
Glucose-Capillary: 102 mg/dL — ABNORMAL HIGH (ref 70–99)
Glucose-Capillary: 115 mg/dL — ABNORMAL HIGH (ref 70–99)
Glucose-Capillary: 126 mg/dL — ABNORMAL HIGH (ref 70–99)
Glucose-Capillary: 106 mg/dL — ABNORMAL HIGH (ref 70–99)

## 2013-06-14 LAB — HEPARIN LEVEL (UNFRACTIONATED): HEPARIN UNFRACTIONATED: 0.53 [IU]/mL (ref 0.30–0.70)

## 2013-06-14 NOTE — Progress Notes (Signed)
[place] TEAM 1 - Stepdown/ICU TEAM Progress Note  Steven Logan [id number] DOB: [birth year] DOA: 06/02/2013 PCP: Tivis Ringer, MD  Admit HPI / Brief Narrative: 43 y.o. M w/ a history of Morbid obesity; Gout; HTN; Dyslipidemia; Pulmonary embolism; Pancreatitis; Arthritis; Depression; Diabetes mellitus; and chronic anemia who presented with right ankle swelling for 2 weeks.  At first it was diagnosed as gout and he was started on prednisone without improvement. Patient reported that he later bumped his leg and a wound opened up and started to ooze pus. Patient subsequently re-presented to the ER.  An MRI of the ankle revealed gas-containing abscess and cellulitis. Orthopedics took him emergently to the OR for debridement. Pt also reported right arm swelling for 2 days.    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 -Ortho recommends continuing current anbxs until dc -will need extensive rehab after dc -started Neurontin for presumed neuropathic etiology to pain 5/20  Acute nonocclusive DVT RUE -jugular, subclavian, axillary, and brachial veins involved  - avoid access/blood draws in R arm   - recent removal of HD vascath from right IJ 5/10 (had been in place since Aug 2014) -resumed Heparin post op -ask pharmacy to dose Warfarin  Anemia in chronic kidney disease / acute postoperative blood loss anemia  -Follow trend - did experience extensive bleeding from wound 5/16 morning per RN report (soaked through dressing and was dripping on bed/floor) necessitating temporary stop of heparin  - transfuse as needed to keep Hgb 7.0 or >  - amputation should correct ongoing loss although did experience blood loss with OR so  transfused 2 units 5/20 with post tx Hgb up to 8.5  Situational Depression - emotional support given and follow up chaplain visit requested 5/19  Poor venous access -has CL left IJ  Encephalopathy, toxic -Mental status waxing and waining  - at this point seems due to pain meds and his usual AMS post HD  End stage renal disease on HD M/W/F per Renal  Hypertension BP stable at present   DM / Hyperglycemia CBG improved - follow w/o change today - A1c 7.0 - does not appear to be on medical tx at home   Obesity - morbid - Body mass index is 27.93 kg/(m^2).  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 rate 19, height 6\' 1"  (1.854 m), weight 211 lb 10.3 oz (96 kg), SpO2 100.00%.  Intake/Output Summary (Last 24 hours) at 06/14/13 0940 Last data filed at 06/14/13 0700  Gross per 24 hour  Intake 2066.6 ml  Output   1916 ml  Net  150.6 ml   Exam: General: No acute respiratory distress Lungs: Clear to auscultation bilaterally, 2L Cardiovascular: Regular rate and rhythm without murmur gallop or rub-improving peripheral edema  Abdomen: Nontender, soft,  bowel sounds positive, no rebound, no ascites, no appreciable mass Extremities: RUE edema essentially resolved; RLE dressed post op-no bleeding  Data Reviewed: Basic Metabolic Panel:  Recent Labs Lab 06/11/13 0830  06/11/13 1500 06/12/13 0450 06/13/13 0500 06/13/13 0935 06/14/13 0608  NA 136*  --  137  140 137  --  137  K 7.0*  < > 4.9 5.1 6.0* 4.3 4.7  CL 92*  --  94* 97 95*  --  96  CO2 20  --  25 26 24   --  26  GLUCOSE 165*  --  126* 111* 100*  --  110*  BUN 83*  --  41* 52* 67*  --  32*  CREATININE 9.41*  --  5.53* 6.76* 8.69*  --  6.26*  CALCIUM 7.5*  --  8.0* 7.7* 7.5*  --  7.6*  < > = values in this interval not displayed. Liver Function Tests: No results found for this basename: AST, ALT, ALKPHOS, BILITOT, PROT, ALBUMIN,  in the last 168 hours CBC:  Recent Labs Lab 06/10/13 2005 06/11/13 0830 06/12/13 0450 06/13/13 0500 06/13/13 1500  WBC 24.9* 24.1* 20.1* 17.4* 17.1*  HGB 6.4* 7.4* 7.5* 6.5* 8.5*  HCT 19.3* 21.9* 22.6* 19.6* 25.5*  MCV 86.2 83.9 85.3 87.9 88.2  PLT 486* 511* 518* 476* 465*   Cardiac Enzymes:  Recent Labs Lab 06/07/13 1623 06/07/13 2045 06/08/13 0318  TROPONINI <0.30 <0.30 <0.30   CBG:  Recent Labs Lab 06/13/13 0816 06/13/13 1311 06/13/13 1704 06/13/13 2138 06/14/13 0759  GLUCAP 104* 120* 164* 132* 102*    Recent Results (from the past 240 hour(s))  CULTURE, ROUTINE-ABSCESS     Status: None   Collection Time    06/04/13  5:59 PM      Result Value Ref Range Status   Specimen Description ABSCESS ANKLE RIGHT   Final   Special Requests PATIENT ON FOLLOWING VANCOMYCIN, UNASYN   Final   Gram Stain     Final   Value: ABUNDANT WBC PRESENT,BOTH PMN AND MONONUCLEAR     NO SQUAMOUS EPITHELIAL CELLS SEEN     FEW GRAM POSITIVE COCCI     IN PAIRS IN CLUSTERS     Performed at [facility]   Culture     Final   Value: MODERATE STAPHYLOCOCCUS AUREUS     Note: RIFAMPIN AND GENTAMICIN SHOULD NOT BE USED AS SINGLE DRUGS FOR TREATMENT OF STAPH INFECTIONS. This organism is presumed to be Clindamycin resistant based on detection of inducible Clindamycin resistance.     Performed at [facility]   Report Status 06/07/2013 FINAL   Final   Organism ID, Bacteria STAPHYLOCOCCUS AUREUS   Final  ANAEROBIC CULTURE     Status: None    Collection Time    06/04/13  5:59 PM      Result Value Ref Range Status   Specimen Description ABSCESS ANKLE RIGHT   Final   Special Requests PATIENT ON FOLLOWING VANCOMYCIN, UNASYN   Final   Gram Stain     Final   Value: ABUNDANT WBC PRESENT,BOTH PMN AND MONONUCLEAR     NO SQUAMOUS EPITHELIAL CELLS SEEN     FEW GRAM POSITIVE COCCI     IN PAIRS IN CLUSTERS     Performed at [facility]   Culture     Final   Value: NO ANAEROBES ISOLATED     Performed at [facility]   Report Status 06/09/2013 FINAL   Final  ANAEROBIC  CULTURE     Status: None   Collection Time    06/04/13  6:07 PM      Result Value Ref Range Status   Specimen Description ABSCESS RIGHT LEG   Final   Special Requests     Final   Value: PATIENT ON FOLLOWING VANCOMYCIN UNASYN CULTURES FROM RIGHT CALF   Gram Stain     Final   Value: FEW WBC PRESENT,BOTH PMN AND MONONUCLEAR     NO SQUAMOUS EPITHELIAL CELLS SEEN     FEW GRAM POSITIVE COCCI     IN PAIRS     Performed at [facility]   Culture     Final   Value: NO ANAEROBES ISOLATED     Performed at [facility]   Report Status 06/09/2013 FINAL   Final  CULTURE, ROUTINE-ABSCESS     Status: None   Collection Time    06/04/13  6:07 PM      Result Value Ref Range Status   Specimen Description ABSCESS RIGHT LEG   Final   Special Requests     Final   Value: PATIENT ON FOLLOWING VANCOMYCIN UNASYN CULTURES FROM RIGHT CALF   Gram Stain     Final   Value: RARE WBC PRESENT,BOTH PMN AND MONONUCLEAR     NO SQUAMOUS EPITHELIAL CELLS SEEN     FEW GRAM POSITIVE COCCI     IN PAIRS     Performed at [facility]   Culture     Final   Value: MODERATE STAPHYLOCOCCUS AUREUS     Note: SUSCEPTIBILITIES PERFORMED ON PREVIOUS CULTURE WITHIN THE LAST 5 DAYS.     Performed at [facility]   Report Status 06/07/2013 FINAL   Final  CULTURE, BLOOD (ROUTINE X 2)     Status: None   Collection Time    06/10/13  6:29 AM      Result Value Ref  Range Status   Specimen Description BLOOD   Final   Special Requests     Final   Value: BOTTLES DRAWN AEROBIC ONLY LEFT INTERNAL JUGULAR TLC DISTAL PORT  10CC   Culture  Setup Time     Final   Value: 06/10/2013 14:40     Performed at [facility]   Culture     Final   Value:        BLOOD CULTURE RECEIVED NO GROWTH TO DATE CULTURE WILL BE HELD FOR 5 DAYS BEFORE ISSUING A FINAL NEGATIVE REPORT     Performed at [facility]   Report Status PENDING   Incomplete     Studies:  Recent x-ray studies have been reviewed in detail by the Attending Physician  Scheduled Meds:  Scheduled Meds: . docusate sodium  100 mg Oral BID  . feeding supplement (GLUCERNA SHAKE)  237 mL Oral TID BM  . gabapentin  100 mg Oral TID  . insulin aspart  0-20 Units Subcutaneous TID WC  . insulin glargine  15 Units Subcutaneous QHS  . multivitamin  1 tablet Oral QHS  . piperacillin-tazobactam (ZOSYN)  IV  2.25 g Intravenous Q8H  . polyethylene glycol  17 g Oral Daily  . sevelamer carbonate  800 mg Oral TID WC    Time spent on care of this patient: 25 mins  Erin Hearing, ANP [facility] For Consults/Admissions - Flow Manager - [phone] Office  [phone] Pager [phone]  On-Call/Text Page:      [email or web address] [email or web address]      password [facility]  06/14/2013, 9:40 AM   LOS: 12 days         Examining patient with ANP Ebony Hail, discussed assessment and plan and agree with plan. Answered all of patient's questions. Greater than 35 minutes was spent on direct patient care of this patient with multiple medical problems

## 2013-06-14 NOTE — Progress Notes (Signed)
I have begun insurance approval for a possible inpt rehab admission , but I may not have a bed this week. Recommend SNF rehab local or another inpt rehab facility. I will follow up tomorrow. I have alerted RN CM. [phone]

## 2013-06-14 NOTE — Progress Notes (Signed)
Subjective:   No complaints  Objective Filed Vitals:   06/13/13 1949 06/14/13 0012 06/14/13 0352 06/14/13 0800  BP: 115/54 145/47 130/63 114/48  Pulse: 127 118 119 112  Temp: 100.6 F (38.1 C) 99.2 F (37.3 C) 101.5 F (38.6 C) 100.6 F (38.1 C)  TempSrc: Oral Oral Oral Oral  Resp: 20 22 19 19   Height:      Weight:   96 kg (211 lb 10.3 oz)   SpO2: 92% 100% 100% 100%   Physical Exam General: no distress, calm Heart: RRR Lungs: CTA. unlabored Abdomen: soft, nontender +BS Extremities: R BKA in post op dressing. 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 ESRD - cont HD, lower dry wt at d/c 3. RUE DVT- heparin gtt per pharm 3. Anemia - hgb 6.5 post op 2u RBC 5/20, also 1 u 5/18 4. HPTH- Ca+ 7.5. Cont calcitriol renvela 5. HTN/volume - BP low-normal, home meds (labetalol) on hold, new dry wt ~96kg 6. Nutrition -  Clears, Nepro, mulitivit. Poor appetite 7. Dispo- likely will need rehab/SNF  Kelly Splinter MD pager [phone]    cell [phone] 06/14/2013, 9:15 AM   Additional Objective Labs: Basic Metabolic Panel:  Recent Labs Lab 06/12/13 0450 06/13/13 0500 06/13/13 0935 06/14/13 0608  NA 140 137  --  137  K 5.1 6.0* 4.3 4.7  CL 97 95*  --  96  CO2 26 24  --  26  GLUCOSE 111* 100*  --  110*  BUN 52* 67*  --  32*  CREATININE 6.76* 8.69*  --  6.26*  CALCIUM 7.7* 7.5*  --  7.6*   Liver Function Tests: No results found for this basename: AST, ALT, ALKPHOS, BILITOT, PROT, ALBUMIN,  in the last 168 hours No results found for this basename: LIPASE, AMYLASE,  in the last 168 hours CBC:  Recent Labs Lab 06/10/13 2005 06/11/13 0830 06/12/13 0450 06/13/13 0500 06/13/13 1500   WBC 24.9* 24.1* 20.1* 17.4* 17.1*  HGB 6.4* 7.4* 7.5* 6.5* 8.5*  HCT 19.3* 21.9* 22.6* 19.6* 25.5*  MCV 86.2 83.9 85.3 87.9 88.2  PLT 486* 511* 518* 476* 465*   Blood Culture    Component Value Date/Time   SDES BLOOD 06/10/2013 0629   SPECREQUEST BOTTLES DRAWN AEROBIC ONLY LEFT INTERNAL JUGULAR TLC DISTAL PORT  10CC 06/10/2013 0629   CULT  Value:        BLOOD CULTURE RECEIVED NO GROWTH TO DATE CULTURE WILL BE HELD FOR 5 DAYS BEFORE ISSUING A FINAL NEGATIVE REPORT Performed at [place] 06/10/2013 0629   REPTSTATUS PENDING 06/10/2013 0629    Cardiac Enzymes:  Recent Labs Lab 06/07/13 1623 06/07/13 2045 06/08/13 0318  TROPONINI <0.30 <0.30 <0.30   CBG:  Recent Labs Lab 06/13/13 0816 06/13/13 1311 06/13/13 1704 06/13/13 2138 06/14/13 0759  GLUCAP 104* 120* 164* 132* 102*   Iron Studies: No results found for this basename: IRON, TIBC, TRANSFERRIN, FERRITIN,  in the last 72 hours @lablastinr3 @ Studies/Results: No results found. Medications: . sodium chloride 10 mL/hr at 06/11/13 1300  . heparin 2,400 Units/hr (06/14/13 0200)   . docusate sodium  100 mg Oral BID  . feeding supplement (GLUCERNA  SHAKE)  237 mL Oral TID BM  . gabapentin  100 mg Oral TID  . insulin aspart  0-20 Units Subcutaneous TID WC  . insulin glargine  15 Units Subcutaneous QHS  . multivitamin  1 tablet Oral QHS  . piperacillin-tazobactam (ZOSYN)  IV  2.25 g Intravenous Q8H  . polyethylene glycol  17 g Oral Daily  . sevelamer carbonate  800 mg Oral TID WC

## 2013-06-14 NOTE — Progress Notes (Signed)
Physical Therapy Treatment Patient Details Name: Steven Logan MRN: [id number] DOB: [birth year] Today's Date: 06/14/2013    History of Present Illness Pt is a 43 y/o male admitted with gas-containing abscess and cellulitis in RLE. Pt is currently NWB and another I&D is planned for 06/06/13. On 06/12/2013 pt to OR for R transtibial amputation.     PT Comments    Pt progressing slowly towards physical therapy goals. Was able to tolerate increased standing time today with Clarise Cruz Plus, however is showing minimal control over RLE during mobility. Attempted therapeutic exercise, with pt unable to perform isometrics or minimal AROM. All motion was passive. Will continue to progress per POC.   Follow Up Recommendations  CIR     Equipment Recommendations  Rolling walker with 5" wheels;3in1 (PT);Wheelchair (measurements PT);Wheelchair cushion (measurements PT)    Recommendations for Other Services Rehab consult     Precautions / Restrictions Precautions Precautions: Fall Precaution Comments: Pt with decreased awareness of RLE in space - needs cueing for positioning before stand>sit. Restrictions Weight Bearing Restrictions: Yes RLE Weight Bearing: Non weight bearing    Mobility  Bed Mobility Overal bed mobility: Needs Assistance;+2 for physical assistance Bed Mobility: Rolling;Sidelying to Sit Rolling: Mod assist Sidelying to sit: Mod assist;+2 for physical assistance       General bed mobility comments: VC's for sequencing and technique. Hand-over-hand assist to reach for bed rails for support. Bed pad used to assist pt in scooting hips towards EOB. Heavy +2 to elevate trunk to full sitting position. Pt unable to lift or advance RLE at all without assist.   Transfers Overall transfer level: Needs assistance Equipment used:  Clarise Cruz Plus lift) Transfers: Sit to/from [facility] Sit to Stand: Total assist;+2 safety/equipment Stand pivot transfers:  Clarise Cruz Plus)        General transfer comment: Pt able to stand x3 times with Clarise Cruz Plus lift, with longest stand x20". Pt required specific cueing for proper placement of UE's and LE's on the equipment. Pt requires tactile cues and specific verbal cues for RLE positioning anteriorly prior to stand>sit transfer, otherwise will let the residual limb rest on bed or chair and will then get stuck between chair and lift on stand>sit with knee bent.   Ambulation/Gait                 Stairs            Wheelchair Mobility    Modified Rankin (Stroke Patients Only)       Balance Overall balance assessment: Needs assistance Sitting-balance support: Feet supported;Bilateral upper extremity supported Sitting balance-Leahy Scale: Poor Sitting balance - Comments: Required trunk support to maintain sitting balance today. Was able to hold for short periods of time only.  Postural control: Posterior lean Standing balance support: Bilateral upper extremity supported Standing balance-Leahy Scale: Zero                      Cognition Arousal/Alertness: Lethargic Behavior During Therapy: Flat affect (Self-limiting) Overall Cognitive Status: Impaired/Different from baseline Area of Impairment: Following commands;Safety/judgement;Problem solving       Following Commands: Follows one step commands inconsistently Safety/Judgement: Decreased awareness of safety;Decreased awareness of deficits   Problem Solving: Slow processing;Decreased initiation;Requires verbal cues;Requires tactile cues      Exercises General Exercises - Lower Extremity Quad Sets:  (Attempted - not able to perform) Long Arc Quad:  (Attempted, not able to perform) Straight Leg Raises: PROM;10 reps (Attempted, not able to perform actively )  General Comments General comments (skin integrity, edema, etc.): Pt self-limiting during session. "This is hard, my leg hurts, it's too early to be doing all this for real." At times pt not  participating at all in mobility despite cueing and continued motivation from therapist/tech.       Pertinent Vitals/Pain HR increased into low 140's during transfer bed to chair with sara plus.     Home Living                      Prior Function            PT Goals (current goals can now be found in the care plan section) Acute Rehab PT Goals Patient Stated Goal: To walk again PT Goal Formulation: With patient Time For Goal Achievement: 06/20/13 Potential to Achieve Goals: Good Progress towards PT goals: Progressing toward goals    Frequency  Min 4X/week    PT Plan Current plan remains appropriate    Co-evaluation             End of Session Equipment Utilized During Treatment:  Clarise Cruz Plus) Activity Tolerance: Patient limited by lethargy;Patient limited by pain Patient left: in chair;with call bell/phone within reach;with nursing/sitter in room     Time: [id number] PT Time Calculation (min): 39 min  Charges:  $Therapeutic Activity: 38-52 mins                    G Codes:      Jolyn Lent 06/20/13, 1:51 PM  Jolyn Lent, PT, DPT Acute Rehabilitation Services Pager: [phone]

## 2013-06-14 NOTE — Progress Notes (Signed)
ANTICOAGULATION CONSULT NOTE - Follow Up Consult  Pharmacy Consult for heparin Indication: RUE DVT  No Known Allergies  Assessment: [name] on heparin for RUE DVT.    Resuming heparin post - op at low dose Has had bleeding issues in the past  Heparin level this AM = 0.53  Goal of Therapy:  Heparin level 0.3-0.5 units/ml Monitor platelets by anticoagulation protocol: Yes   Plan:  1. Heparin drip to 2300 units / hr 2. Heparin level 8 hours after heparin resumed 3. Daily labs  Please let pharmacy know when you want to start Coumadin -- will need 5 days of overlap    Patient Measurements: Height: 6\' 1"  (185.4 cm) Weight: 211 lb 10.3 oz (96 kg) IBW/kg (Calculated) : 79.9 Heparin Dosing Weight: 103 kg  Vital Signs: Temp: 100.6 F (38.1 C) (05/21 0800) Temp src: Oral (05/21 0800) BP: 114/48 mmHg (05/21 0800) Pulse Rate: 112 (05/21 0800)  Labs:  Recent Labs  06/12/13 0450 06/13/13 0500 06/13/13 1500 06/13/13 2300 06/14/13 0608  HGB 7.5* 6.5* 8.5*  --   --   HCT 22.6* 19.6* 25.5*  --   --   PLT 518* 476* 465*  --   --   HEPARINUNFRC  --   --   --  0.31 0.53  CREATININE 6.76* 8.69*  --   --  6.26*    Estimated Creatinine Clearance: 18.6 ml/min (by C-G formula based on Cr of 6.26).   Thank you. Anette Guarneri, PharmD [phone]   06/14/2013 9:54 AM

## 2013-06-14 NOTE — Progress Notes (Signed)
ANTICOAGULATION CONSULT NOTE - Follow Up Consult  Pharmacy Consult for heparin Indication: RUE DVT  No Known Allergies  Patient Measurements: Height: 6\' 1"  (185.4 cm) Weight: 212 lb 4.9 oz (96.3 kg) IBW/kg (Calculated) : 79.9 Heparin Dosing Weight: 103 kg  Vital Signs: Temp: 99.2 F (37.3 C) (05/21 0012) Temp src: Oral (05/21 0012) BP: 145/47 mmHg (05/21 0012) Pulse Rate: 118 (05/21 0012)  Labs:  Recent Labs  06/11/13 1500 06/12/13 0450 06/13/13 0500 06/13/13 1500 06/13/13 2300  HGB  --  7.5* 6.5* 8.5*  --   HCT  --  22.6* 19.6* 25.5*  --   PLT  --  518* 476* 465*  --   HEPARINUNFRC  --   --   --   --  0.31  CREATININE 5.53* 6.76* 8.69*  --   --     Estimated Creatinine Clearance: 13.4 ml/min (by C-G formula based on Cr of 8.69).   Assessment: 43 yo male with RUE DVT for heparin  Goal of Therapy:  Heparin level 0.3-0.5 units/ml Monitor platelets by anticoagulation protocol: Yes   Plan:  Continue Heparin at current rate  Follow-up am labs.  Phillis Knack, PharmD, BCPS   06/14/2013 12:37 AM

## 2013-06-14 NOTE — Progress Notes (Signed)
CSW provided support to pt and updated him on plan. PT recommending CIR, and CSW explained inpatient rehab is appealing to insurance to see if they can get authorization. CSW explained SNF back-up and that CSW will speak with pt/POA Kendrick Fries if CIR is unable to take pt. Pt expressed feeling it was difficult participating with PT and that his surgery was too recent. CSW provided support and reminded pt that CIR would be intensive; pt understanding. CSW continues to follow.   Ky Barban, MSW, [facility] Clinical Social Worker [phone]

## 2013-06-14 NOTE — Progress Notes (Addendum)
Clinical Social Work Department CLINICAL SOCIAL WORK PLACEMENT NOTE 06/14/2013 SEE NOTES BELOW Patient:  Steven Logan, Steven Logan  Account Number:  [id number] Admit date:  06/02/2013  Clinical Social Worker:  Ky Barban, Latanya Presser  Date/time:  06/14/2013 04:26 PM  Clinical Social Work is seeking post-discharge placement for this patient at the following level of care:   [place]   (*CSW will update this form in Epic as items are completed)   N/A-pt authorized CSW to make referrals to all SNFs in [facility]  Patient/family provided with [place] Department of Clinical Social Work's list of facilities offering this level of care within the geographic area requested by the patient (or if unable, by the patient's family).  06/14/2013  Patient/family informed of their freedom to choose among providers that offer the needed level of care, that participate in Medicare, Medicaid or managed care program needed by the patient, have an available bed and are willing to accept the patient.  N/A-not sent to this facility  Patient/family informed of [facility]' ownership interest in [facility], as well as of the fact that they are under no obligation to receive care at this facility.  PASARR submitted to EDS on 06/14/2013 PASARR number received from EDS on 06/14/2013  FL2 transmitted to all facilities in geographic area requested by pt/family on  06/14/2013 FL2 transmitted to all facilities within larger geographic area on   Patient informed that his/her managed care company has contracts with or will negotiate with  certain facilities, including the following: Insurance authorization received from [facility] 06/29/13    Patient/family informed of bed offers received:  06/15/13 Patient chooses bed at [facility] Physician recommends and patient chooses bed at    Patient to be transferred to [facility] on 07/02/13   Patient to be transferred to facility by  ambulance  The following physician request were entered in Epic:  Additional Comments: 06/21/13: CSW talked by phone with Ms. Noel Christmas [phone]) regarding a SNF decision. CSW informed that they are thinking about [facility] and she plans to visit facility today and then come to hospital and talk with patient. Ms. Arnette Schaumann will ask for CSW if they chose another facility.  Shelle Iron, LCSW [phone]) 06/28/13: CSW talked by phone with Ms. Posey to confirm facility choice and she was informed that per MD, patient will be ready for d/c on Saturday. Ms. Arnette Schaumann was in patient's room and [facility] remains their choice. Shelle Iron, LCSW [phone]) 07/02/13: Patient was set to discharge Saturday- 6.6, however discharge cancelled. CSW initially visited room and informed patient of today's discharge. CSW later contacted Ms. Noel Christmas, [place] by phone to inform her of today's discharge and that transport had been contacted.  Shelle Iron, LCSW [id number])    Ky Barban, MSW, [facility] Clinical Social Worker [phone]

## 2013-06-15 ENCOUNTER — Encounter (HOSPITAL_COMMUNITY): Payer: Self-pay | Admitting: Orthopedic Surgery

## 2013-06-15 LAB — BASIC METABOLIC PANEL
BUN: 44 mg/dL — AB (ref 6–23)
CHLORIDE: 95 meq/L — AB (ref 96–112)
CO2: 24 meq/L (ref 19–32)
Calcium: 7.3 mg/dL — ABNORMAL LOW (ref 8.4–10.5)
Creatinine, Ser: 8.24 mg/dL — ABNORMAL HIGH (ref 0.50–1.35)
GFR calc Af Amer: 8 mL/min — ABNORMAL LOW (ref >90)
GFR calc non Af Amer: 7 mL/min — ABNORMAL LOW (ref >90)
Glucose, Bld: 73 mg/dL (ref 70–99)
Potassium: 4.7 mEq/L (ref 3.7–5.3)
Sodium: 136 mEq/L — ABNORMAL LOW (ref 137–147)

## 2013-06-15 LAB — GLUCOSE, CAPILLARY
GLUCOSE-CAPILLARY: 105 mg/dL — AB (ref 70–99)
Glucose-Capillary: 81 mg/dL (ref 70–99)

## 2013-06-15 LAB — HEPARIN LEVEL (UNFRACTIONATED)
HEPARIN UNFRACTIONATED: 0.57 [IU]/mL (ref 0.30–0.70)
Heparin Unfractionated: 0.11 IU/mL — ABNORMAL LOW (ref 0.30–0.70)
Heparin Unfractionated: 0.77 IU/mL — ABNORMAL HIGH (ref 0.30–0.70)

## 2013-06-15 LAB — CBC
HCT: 22.2 % — ABNORMAL LOW (ref 39.0–52.0)
HEMOGLOBIN: 7.1 g/dL — AB (ref 13.0–17.0)
MCH: 29.2 pg (ref 26.0–34.0)
MCHC: 32 g/dL (ref 30.0–36.0)
MCV: 91.4 fL (ref 78.0–100.0)
Platelets: 389 10*3/uL (ref 150–400)
RBC: 2.43 MIL/uL — ABNORMAL LOW (ref 4.22–5.81)
RDW: 16.1 % — ABNORMAL HIGH (ref 11.5–15.5)
WBC: 16.1 10*3/uL — ABNORMAL HIGH (ref 4.0–10.5)

## 2013-06-15 LAB — CULTURE, BLOOD (ROUTINE X 2): CULTURE: NO GROWTH

## 2013-06-15 LAB — IRON AND TIBC
Iron: 19 ug/dL — ABNORMAL LOW (ref 42–135)
Saturation Ratios: 15 % — ABNORMAL LOW (ref 20–55)
TIBC: 126 ug/dL — ABNORMAL LOW (ref 215–435)
UIBC: 107 ug/dL — AB (ref 125–400)

## 2013-06-15 LAB — PREPARE RBC (CROSSMATCH)

## 2013-06-15 MED ORDER — WARFARIN VIDEO: Freq: Once | Status: DC

## 2013-06-15 MED ORDER — NEPRO/CARBSTEADY PO LIQD
237.0000 mL | ORAL | Status: DC | PRN
  Administered 2013-06-16: 237 mL via ORAL

## 2013-06-15 MED ORDER — LIDOCAINE-PRILOCAINE 2.5-2.5 % EX CREA
1.0000 "application " | TOPICAL_CREAM | CUTANEOUS | Status: DC | PRN
  Filled 2013-06-15: qty 5

## 2013-06-15 MED ORDER — SODIUM CHLORIDE 0.9 % IV SOLN: 100.0000 mL | INTRAVENOUS | Status: DC | PRN

## 2013-06-15 MED ORDER — ALTEPLASE 2 MG IJ SOLR
2.0000 mg | Freq: Once | INTRAMUSCULAR | Status: AC | PRN
  Filled 2013-06-15: qty 2

## 2013-06-15 MED ORDER — PATIENT'S GUIDE TO USING COUMADIN BOOK
Freq: Once | Status: AC
  Administered 2013-06-15: 19:00:00
  Filled 2013-06-15: qty 1

## 2013-06-15 MED ORDER — WARFARIN - PHARMACIST DOSING INPATIENT
Freq: Every day | Status: DC
  Administered 2013-06-19: 18:00:00

## 2013-06-15 MED ORDER — PENTAFLUOROPROP-TETRAFLUOROETH EX AERO: 1.0000 "application " | INHALATION_SPRAY | CUTANEOUS | Status: DC | PRN

## 2013-06-15 MED ORDER — WARFARIN SODIUM 7.5 MG PO TABS
7.5000 mg | ORAL_TABLET | Freq: Once | ORAL | Status: AC
  Administered 2013-06-15: 7.5 mg via ORAL
  Filled 2013-06-15: qty 1

## 2013-06-15 MED ORDER — HEPARIN SODIUM (PORCINE) 1000 UNIT/ML DIALYSIS
1000.0000 [IU] | INTRAMUSCULAR | Status: DC | PRN
  Filled 2013-06-15: qty 1

## 2013-06-15 MED ORDER — HEPARIN SODIUM (PORCINE) 1000 UNIT/ML DIALYSIS
2500.0000 [IU] | INTRAMUSCULAR | Status: DC | PRN
  Filled 2013-06-15: qty 3

## 2013-06-15 MED ORDER — HYDROCODONE-ACETAMINOPHEN 5-325 MG PO TABS
ORAL_TABLET | ORAL | Status: AC
  Filled 2013-06-15: qty 2

## 2013-06-15 MED ORDER — GABAPENTIN 100 MG PO CAPS
200.0000 mg | ORAL_CAPSULE | Freq: Three times a day (TID) | ORAL | Status: DC
  Administered 2013-06-15 – 2013-06-18 (×11): 200 mg via ORAL
  Filled 2013-06-15 (×15): qty 2

## 2013-06-15 MED ORDER — DARBEPOETIN ALFA-POLYSORBATE 150 MCG/0.3ML IJ SOLN
150.0000 ug | INTRAMUSCULAR | Status: DC
  Administered 2013-06-15: 150 ug via INTRAVENOUS
  Filled 2013-06-15 (×3): qty 0.3

## 2013-06-15 MED ORDER — DARBEPOETIN ALFA-POLYSORBATE 150 MCG/0.3ML IJ SOLN
INTRAMUSCULAR | Status: AC
  Filled 2013-06-15: qty 0.3

## 2013-06-15 MED ORDER — LIDOCAINE HCL (PF) 1 % IJ SOLN: 5.0000 mL | INTRAMUSCULAR | Status: DC | PRN

## 2013-06-15 NOTE — Progress Notes (Signed)
ANTICOAGULATION CONSULT NOTE - Follow Up Consult  Pharmacy Consult for heparin Indication: RUE DVT  No Known Allergies  Patient Measurements: Height: 6\' 1"  (185.4 cm) Weight: 211 lb 10.3 oz (96 kg) IBW/kg (Calculated) : 79.9 Heparin Dosing Weight: 103 kg  Vital Signs: Temp: 98.9 F (37.2 C) (05/22 0400) Temp src: Oral (05/22 0400) BP: 140/85 mmHg (05/22 0400) Pulse Rate: 101 (05/22 0400)  Labs:  Recent Labs  06/13/13 0500 06/13/13 1500 06/13/13 2300 06/14/13 0608 06/15/13 0359  HGB 6.5* 8.5*  --   --  7.1*  HCT 19.6* 25.5*  --   --  22.2*  PLT 476* 465*  --   --  389  HEPARINUNFRC  --   --  0.31 0.53 0.11*  CREATININE 8.69*  --   --  6.26* 8.24*    Estimated Creatinine Clearance: 14.1 ml/min (by C-G formula based on Cr of 8.24).  Assessment: [name] on heparin for RUE DVT.  Heparin was resumed post-op at 2300 units/hr.  No bleeding noted and no IV line issues.  Heparin level sub-therapeutic at 0.11 after being within goal yesterday at 0.53.  Goal of Therapy:  Heparin level 0.3-0.5 units/ml Monitor platelets by anticoagulation protocol: Yes   Plan:  1. Will increase IV Heparin drip to 2450 units / hr 2. Heparin level 8 hours after heparin resumed 3. Daily labs  Please let pharmacy know when you want to start Coumadin -- will need 5 days of overlap  Rober Minion, PharmD., MS Clinical Pharmacist Pager:  [phone] Thank you for allowing pharmacy to be part of this patients care team.  06/15/2013 6:55 AM

## 2013-06-15 NOTE — Procedures (Signed)
Patient was seen on dialysis and the procedure was supervised.  BFR 500  Via AVF BP is  175/67.   Patient appears to be tolerating treatment well  Louis Meckel 06/15/2013

## 2013-06-15 NOTE — Progress Notes (Signed)
ANTICOAGULATION CONSULT NOTE: HEPARIN Indication: DVT  Heparin level = 0.57 on 2450 units/hr Goal heparin level= 0.3-0.5  The heparin level is slightly above the desired range but it seems to be trending down. Will leave the rate alone and recheck the heparin level in the AM.  Arrie Senate, PharmD

## 2013-06-15 NOTE — Progress Notes (Addendum)
PT Cancellation Note  Patient Details Name: Steven Logan MRN: [id number] DOB: [birth year]   Cancelled Treatment:    Reason Eval/Treat Not Completed: Patient declined, no reason specified.  Pt states he does not feel good and doesn't want to participate. Therapist provided encouragement, with pt stating "I don't care, I don't want to do it.". Therapist positioned residual limb in knee extension with end propped onto a pillow. Will continue to follow.    Jolyn Lent 06/15/2013, 11:25 AM  Jolyn Lent, PT, DPT Acute Rehabilitation Services Pager: [phone]

## 2013-06-15 NOTE — Progress Notes (Addendum)
CSW provided list of bed offers for SNF to patient. Pt expressed being "depressed" and not wanting to give up but he is feeling overwhelmed. RN and CSW provided support. CSW offered to call Kendrick Fries and give her an update and give her bed offers as well. Pt refused to work with PT. PT/RN/CSW all reinforced the importance of working with PT. Pt transferring to [place]. CSW will provide handoff to 6E unit CSW.  Addendum: Called pt's friend/POA Kendrick Fries and gave her bed offers. Explained CIR may not get insurance auth and it is important to let CSW know which facility they would like ASAP so insurance auth can be explored for this. Pt to transfer to [place], and CSW will provide a handoff for unit CSW after he transfers.  Ky Barban, MSW, [facility] Clinical Social Worker [phone]

## 2013-06-15 NOTE — Progress Notes (Signed)
I await bed availability and insurance approval to admit pt. I will discuss with pt today. Bed not available this week. [id number]

## 2013-06-15 NOTE — Progress Notes (Addendum)
ADDENDUM Patient's heparin level was drawn ~3 hours too early while in dialysis. Unable to accurately determine if this level was drawn correctly and with it not being at steady state it will need to be redrawn.  Plan: -Redraw heparin level at 1800 tonight  Hibo Blasdell D. Annalea Alguire, PharmD, BCPS Clinical Pharmacist Pager: [phone] 06/15/2013 3:39 PM   ANTICOAGULATION CONSULT NOTE - Follow Up [place] for Heparin / Coumadin Indication: RUE DVT  No Known Allergies  Assessment: [name] on heparin for RUE DVT.    Resuming heparin post - op at low dose Has had bleeding issues in the past  Heparin level this AM = 0.11  Day 1 of 5 of overlap  Goal of Therapy:  Heparin level 0.3-0.5 units/ml Monitor platelets by anticoagulation protocol: Yes INR = 2 to 3   Plan:  1. Heparin drip to 2450 units/ hr with heparin level at 3 pm 2. Coumadin 7.5 mg po x 1 dose tonight 3. Daily labs    Patient Measurements: Height: 6\' 1"  (185.4 cm) Weight: 211 lb 10.3 oz (96 kg) IBW/kg (Calculated) : 79.9 Heparin Dosing Weight: 103 kg  Vital Signs: Temp: 98.7 F (37.1 C) (05/22 0749) Temp src: Oral (05/22 0749) BP: 148/67 mmHg (05/22 0749) Pulse Rate: 98 (05/22 0749)  Labs:  Recent Labs  06/13/13 0500 06/13/13 1500 06/13/13 2300 06/14/13 0608 06/15/13 0359  HGB 6.5* 8.5*  --   --  7.1*  HCT 19.6* 25.5*  --   --  22.2*  PLT 476* 465*  --   --  389  HEPARINUNFRC  --   --  0.31 0.53 0.11*  CREATININE 8.69*  --   --  6.26* 8.24*    Estimated Creatinine Clearance: 14.1 ml/min (by C-G formula based on Cr of 8.24).   Thank you. Anette Guarneri, PharmD [phone]   06/15/2013 10:29 AM

## 2013-06-15 NOTE — Progress Notes (Signed)
Subjective:   Doing better, no severe pain, got up to chair with assistance yest  Objective Filed Vitals:   06/14/13 2000 06/15/13 0000 06/15/13 0400 06/15/13 0749  BP: 132/66 130/69 140/85 148/67  Pulse: 100 94 101 98  Temp: 99 F (37.2 C) 98.3 F (36.8 C) 98.9 F (37.2 C) 98.7 F (37.1 C)  TempSrc: Oral Oral Oral Oral  Resp: 13 13 14 17   Height:      Weight:      SpO2: 100% 95% 97% 100%   Physical Exam General: no distress, calm Heart: RRR Lungs: CTA. unlabored Abdomen: soft, nontender +BS Extremities: R BKA in post op dressing. LLE trace edema Dialysis Access:  L AVF patent on HD  HD: MWF [facility]  4.5h 111.5kg 2/2.5 Bath 500/800 Heparin 11000 LFA AVF  Calcitriol 1.25ug EPO 8000 Venofer s/p load completed 5/11   Assessment: 1. 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 ESRD - cont HD, lower dry wt at d/c 3. RUE DVT- heparin gtt per pharm 3. Anemia - hgb 7.1, has had 3u prbc transfused, starting aranesp 150/wk, check Fe 4. HPTH- Ca+ 7.5. Cont calcitriol renvela 5. HTN/volume - BP low-normal, home meds (labetalol) on hold, new dry wt ~96kg 6. Nutrition -  Clears, Nepro, mulitivit. Poor appetite 7. Dispo- likely will need rehab/SNF  Plan- HD today, transfuse 1u prbc, check Fe, start Roxana Hires MD pager [phone]    cell [phone] 06/15/2013, 8:22 AM   Additional Objective Labs: Basic Metabolic Panel:  Recent Labs Lab 06/13/13 0500 06/13/13 0935 06/14/13 0608 06/15/13 0359  NA 137  --  137 136*  K 6.0* 4.3 4.7 4.7  CL 95*  --  96 95*  CO2 24  --  26 24  GLUCOSE 100*  --  110* 73  BUN 67*  --  32* 44*  CREATININE 8.69*  --  6.26* 8.24*  CALCIUM 7.5*  --  7.6* 7.3*   Liver Function Tests: No results found for this basename: AST, ALT, ALKPHOS, BILITOT, PROT, ALBUMIN,  in the last 168 hours No results found for this basename: LIPASE, AMYLASE,  in the last 168 hours CBC:  Recent  Labs Lab 06/11/13 0830 06/12/13 0450 06/13/13 0500 06/13/13 1500 06/15/13 0359  WBC 24.1* 20.1* 17.4* 17.1* 16.1*  HGB 7.4* 7.5* 6.5* 8.5* 7.1*  HCT 21.9* 22.6* 19.6* 25.5* 22.2*  MCV 83.9 85.3 87.9 88.2 91.4  PLT 511* 518* 476* 465* 389   Blood Culture    Component Value Date/Time   SDES BLOOD 06/10/2013 0629   SPECREQUEST BOTTLES DRAWN AEROBIC ONLY LEFT INTERNAL JUGULAR TLC DISTAL PORT  10CC 06/10/2013 0629   CULT  Value: NO GROWTH 5 DAYS Performed at [facility] 06/10/2013 0629   REPTSTATUS 06/15/2013 FINAL 06/10/2013 0629    Cardiac Enzymes: No results found for this basename: CKTOTAL, CKMB, CKMBINDEX, TROPONINI,  in the last 168 hours CBG:  Recent Labs Lab 06/13/13 2138 06/14/13 0759 06/14/13 1214 06/14/13 1625 06/14/13 2134  GLUCAP 132* 102* 115* 126* 106*   Iron Studies: No results found for this basename: IRON, TIBC, TRANSFERRIN, FERRITIN,  in the last 72 hours @lablastinr3 @ Studies/Results: No results found. Medications: . sodium chloride 10 mL/hr at 06/11/13 1300  . heparin 2,450 Units/hr (06/15/13 0655)   . docusate sodium  100 mg Oral BID  . feeding supplement (GLUCERNA SHAKE)  237 mL Oral TID BM  .  gabapentin  200 mg Oral TID  . insulin aspart  0-20 Units Subcutaneous TID WC  . insulin glargine  15 Units Subcutaneous QHS  . multivitamin  1 tablet Oral QHS  . piperacillin-tazobactam (ZOSYN)  IV  2.25 g Intravenous Q8H  . polyethylene glycol  17 g Oral Daily  . sevelamer carbonate  800 mg Oral TID WC

## 2013-06-15 NOTE — Progress Notes (Signed)
[place] TEAM 1 - Stepdown/ICU TEAM Progress Note  Steven Logan [id number] DOB: 11/26/1970 DOA: 06/02/2013 PCP: Tivis Ringer, MD  Admit HPI / Brief Narrative: 43 y.o. M w/ a history of Morbid obesity; Gout; HTN; Dyslipidemia; Pulmonary embolism; Pancreatitis; Arthritis; Depression; Diabetes mellitus; and chronic anemia who presented with right ankle swelling for 2 weeks.  At first it was diagnosed as gout and he was started on prednisone without improvement. Patient reported that he later bumped his leg and a wound opened up and started to ooze pus. Patient subsequently re-presented to the ER.  An MRI of the ankle revealed gas-containing abscess and cellulitis. Orthopedics took him emergently to the OR for debridement. Pt also reported right arm swelling for 2 days.    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 -Ortho recommends continuing current anbxs until dc -will need extensive rehab after dc -started Neurontin for presumed neuropathic etiology to pain 5/20-increased to 200 TID 5/22  Acute nonocclusive DVT RUE -jugular, subclavian, axillary, and brachial veins involved  - avoid access/blood draws in R arm   - recent removal of HD vascath from right IJ 5/10 (had been in place since Aug 2014) -resumed Heparin post op - pharmacy dosing Warfarin  Anemia in chronic kidney disease / acute postoperative blood loss anemia  -Follow trend - did experience extensive bleeding from wound 5/16 morning per RN report (soaked through dressing and was dripping on bed/floor) necessitating temporary stop of heparin  - transfuse as needed to keep Hgb 7.0 or >  - amputation should correct ongoing loss although did  experience blood loss with OR so transfused 2 units 5/20 with post tx Hgb up to 8.5  Situational Depression - emotional support given and follow up chaplain visit requested 5/19  Poor venous access -has CL left IJ-once off Heparin rec place PIV and dc CL  Encephalopathy, toxic -Mental status waxing and waining -worse on HD days - at this point seems due to pain meds and his usual AMS post HD  End stage renal disease on HD M/W/F per Renal  Hypertension BP stable at present   DM / Hyperglycemia CBG improved - follow w/o change today - A1c 7.0 - does not appear to be on medical tx at home   Obesity - morbid - Body mass index is 27.93 kg/(m^2).  Code Status: FULL Family Communication: no family present at time of exam in HD Disposition Plan: SDU-likely tx to renal floor 5/22 if decision re; CIR not finalized  Consultants: Ortho Nephro  Procedures: I and D on 06/03/2013  IRRIGATION AND DEBRIDEMENT EXTREMITY extensive excision of muscle fascia and soft tissue right leg 06/06/13  Transtibial amputation 5/19  Antibiotics: Vanc 5/9 >5/17 Zosyn 5/9 >  DVT prophylaxis: IV heparin   Objective: Blood pressure 148/67, pulse 98, temperature 98.7 F (37.1 C), temperature source Oral, resp. rate 17, height 6\' 1"  (1.854 m), weight 211 lb 10.3 oz (96 kg), SpO2 100.00%.  Intake/Output Summary (Last 24 hours) at 06/15/13 0917 Last data filed at 06/15/13 0655  Gross per 24 hour  Intake 709.58 ml  Output      0 ml  Net 709.58 ml   Exam: General: No acute respiratory distress Lungs: Clear to auscultation  bilaterally, 2L-weaning Cardiovascular: Regular rate and rhythm without murmur gallop or rub-improving peripheral edema  Abdomen: Nontender, soft, bowel sounds positive, no rebound, no ascites, no appreciable mass Extremities: RUE edema essentially resolved; RLE dressed post op-no bleeding  Data Reviewed: Basic Metabolic Panel:  Recent Labs Lab 06/11/13 1500 06/12/13 0450  06/13/13 0500 06/13/13 0935 06/14/13 0608 06/15/13 0359  NA 137 140 137  --  137 136*  K 4.9 5.1 6.0* 4.3 4.7 4.7  CL 94* 97 95*  --  96 95*  CO2 25 26 24   --  26 24  GLUCOSE 126* 111* 100*  --  110* 73  BUN 41* 52* 67*  --  32* 44*  CREATININE 5.53* 6.76* 8.69*  --  6.26* 8.24*  CALCIUM 8.0* 7.7* 7.5*  --  7.6* 7.3*   Liver Function Tests: No results found for this basename: AST, ALT, ALKPHOS, BILITOT, PROT, ALBUMIN,  in the last 168 hours CBC:  Recent Labs Lab 06/11/13 0830 06/12/13 0450 06/13/13 0500 06/13/13 1500 06/15/13 0359  WBC 24.1* 20.1* 17.4* 17.1* 16.1*  HGB 7.4* 7.5* 6.5* 8.5* 7.1*  HCT 21.9* 22.6* 19.6* 25.5* 22.2*  MCV 83.9 85.3 87.9 88.2 91.4  PLT 511* 518* 476* 465* 389   Cardiac Enzymes: No results found for this basename: CKTOTAL, CKMB, CKMBINDEX, TROPONINI,  in the last 168 hours CBG:  Recent Labs Lab 06/14/13 0759 06/14/13 1214 06/14/13 1625 06/14/13 2134 06/15/13 0745  GLUCAP 102* 115* 126* 106* 81    Recent Results (from the past 240 hour(s))  CULTURE, BLOOD (ROUTINE X 2)     Status: None   Collection Time    06/10/13  6:29 AM      Result Value Ref Range Status   Specimen Description BLOOD   Final   Special Requests     Final   Value: BOTTLES DRAWN AEROBIC ONLY LEFT INTERNAL JUGULAR TLC DISTAL PORT  10CC   Culture  Setup Time     Final   Value: 06/10/2013 14:40     Performed at [facility]   Culture     Final   Value: NO GROWTH 5 DAYS     Performed at [facility]   Report Status 06/15/2013 FINAL   Final     Studies:  Recent x-ray studies have been reviewed in detail by the Attending Physician  Scheduled Meds:  Scheduled Meds: . darbepoetin (ARANESP) injection - DIALYSIS  150 mcg Intravenous Q Fri-HD  . docusate sodium  100 mg Oral BID  . feeding supplement (GLUCERNA SHAKE)  237 mL Oral TID BM  . gabapentin  200 mg Oral TID  . insulin aspart  0-20 Units Subcutaneous TID WC  . insulin glargine  15 Units  Subcutaneous QHS  . multivitamin  1 tablet Oral QHS  . piperacillin-tazobactam (ZOSYN)  IV  2.25 g Intravenous Q8H  . polyethylene glycol  17 g Oral Daily  . sevelamer carbonate  800 mg Oral TID WC    Time spent on care of this patient: 25 mins  Erin Hearing, ANP [facility] For Consults/Admissions - Flow Manager - [phone] Office  [phone] Pager [phone]  On-Call/Text Page:      [email or web address]      password [facility]  06/15/2013, 9:17 AM   LOS: 13 days     Examined patient with ANP Ebony Hail discussed assessment and plan, and agree with the above plan.  All questions from patient and family were answered.  Greater than 35 minutes were used  in direct patient care of this complicated patient

## 2013-06-16 LAB — BASIC METABOLIC PANEL
BUN: 26 mg/dL — ABNORMAL HIGH (ref 6–23)
CALCIUM: 7.2 mg/dL — AB (ref 8.4–10.5)
CO2: 27 mEq/L (ref 19–32)
Chloride: 93 mEq/L — ABNORMAL LOW (ref 96–112)
Creatinine, Ser: 5.55 mg/dL — ABNORMAL HIGH (ref 0.50–1.35)
GFR, EST AFRICAN AMERICAN: 13 mL/min — AB (ref >90)
GFR, EST NON AFRICAN AMERICAN: 11 mL/min — AB (ref >90)
Glucose, Bld: 161 mg/dL — ABNORMAL HIGH (ref 70–99)
Potassium: 3.8 mEq/L (ref 3.7–5.3)
SODIUM: 135 meq/L — AB (ref 137–147)

## 2013-06-16 LAB — GLUCOSE, CAPILLARY
GLUCOSE-CAPILLARY: 140 mg/dL — AB (ref 70–99)
Glucose-Capillary: 133 mg/dL — ABNORMAL HIGH (ref 70–99)
Glucose-Capillary: 105 mg/dL — ABNORMAL HIGH (ref 70–99)
Glucose-Capillary: 150 mg/dL — ABNORMAL HIGH (ref 70–99)

## 2013-06-16 LAB — TYPE AND SCREEN
ABO/RH(D): A POS
Antibody Screen: NEGATIVE
UNIT DIVISION: 0
Unit division: 0
Unit division: 0

## 2013-06-16 LAB — PROTIME-INR
INR: 1.17 (ref 0.00–1.49)
Prothrombin Time: 14.7 seconds (ref 11.6–15.2)

## 2013-06-16 LAB — CBC
HCT: 23.3 % — ABNORMAL LOW (ref 39.0–52.0)
Hemoglobin: 7.9 g/dL — ABNORMAL LOW (ref 13.0–17.0)
MCH: 30.3 pg (ref 26.0–34.0)
MCHC: 33.9 g/dL (ref 30.0–36.0)
MCV: 89.3 fL (ref 78.0–100.0)
PLATELETS: 310 10*3/uL (ref 150–400)
RBC: 2.61 MIL/uL — ABNORMAL LOW (ref 4.22–5.81)
RDW: 15.1 % (ref 11.5–15.5)
WBC: 14.2 10*3/uL — AB (ref 4.0–10.5)

## 2013-06-16 LAB — HEPARIN LEVEL (UNFRACTIONATED)
HEPARIN UNFRACTIONATED: 0.33 [IU]/mL (ref 0.30–0.70)
Heparin Unfractionated: 0.3 IU/mL (ref 0.30–0.70)

## 2013-06-16 MED ORDER — SODIUM CHLORIDE 0.9 % IV SOLN
125.0000 mg | Freq: Once | INTRAVENOUS | Status: AC
  Administered 2013-06-16: 125 mg via INTRAVENOUS
  Filled 2013-06-16: qty 10

## 2013-06-16 MED ORDER — SODIUM CHLORIDE 0.9 % IV SOLN
125.0000 mg | INTRAVENOUS | Status: DC
  Administered 2013-06-18 – 2013-06-25 (×4): 125 mg via INTRAVENOUS
  Filled 2013-06-16 (×11): qty 10

## 2013-06-16 MED ORDER — WARFARIN SODIUM 7.5 MG PO TABS
7.5000 mg | ORAL_TABLET | Freq: Once | ORAL | Status: AC
  Administered 2013-06-16: 7.5 mg via ORAL
  Filled 2013-06-16: qty 1

## 2013-06-16 MED ORDER — LABETALOL HCL 200 MG PO TABS
200.0000 mg | ORAL_TABLET | Freq: Two times a day (BID) | ORAL | Status: DC
  Administered 2013-06-16 – 2013-07-02 (×32): 200 mg via ORAL
  Filled 2013-06-16 (×36): qty 1

## 2013-06-16 NOTE — Progress Notes (Signed)
Subjective:   No complaints, "when I am going ?"    Objective Filed Vitals:   06/15/13 1604 06/15/13 1739 06/15/13 2100 06/16/13 0629  BP: 185/67 146/82 149/82 178/86  Pulse: 101 98 112 99  Temp: 98.2 F (36.8 C) 99.2 F (37.3 C) 98.9 F (37.2 C) 98.6 F (37 C)  TempSrc: Oral Oral Oral Oral  Resp: 16 16 18 18   Height:      Weight: 96.5 kg (212 lb 11.9 oz)  97.8 kg (215 lb 9.8 oz)   SpO2: 97% 98% 94% 94%   Physical Exam General: no distress, calm Heart: RRR Lungs: CTA. unlabored Abdomen: soft, nontender +BS Extremities: R BKA in post op dressing. No LE edema Dialysis Access:  L AVF patent on HD  HD: [facility]  4.5h 111.5kg 2/2.5 Bath 500/800 Heparin 11000 LFA AVF  Calcitriol 1.25ug EPO 8000 Venofer s/p load completed 5/11   Assessment: 1. 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 ESRD on HD new dry wt at DC 3. RUE DVT- heparin gtt per pharm, coumadin started yesterday 3. Anemia - hgb 7.1, has had 3u prbc transfused, on aranesp 150/wk, Fe low > 1gm load ordered over 8 Rx's 4. HPTH- Ca+ 7.5. Cont calcitriol renvela 5. HTN/volume - BP's up, resume labetalol, UF with next HD on Monday 6. Nutrition -  Clears, Nepro, mulitivit. Poor appetite 7. Dispo- likely will need rehab/SNF  Plan- HD Monday, start IV Fe, aranesp, labetalol  Kelly Splinter MD pager [phone]    cell [phone] 06/16/2013, 9:07 AM   Additional Objective Labs: Basic Metabolic Panel:  Recent Labs Lab 06/14/13 0608 06/15/13 0359 06/16/13 0610  NA 137 136* 135*  K 4.7 4.7 3.8  CL 96 95* 93*  CO2 26 24 27   GLUCOSE 110* 73 161*  BUN 32* 44* 26*  CREATININE 6.26* 8.24* 5.55*  CALCIUM 7.6* 7.3* 7.2*   Liver Function Tests: No results found for this basename: AST, ALT, ALKPHOS, BILITOT, PROT, ALBUMIN,  in the last 168 hours No results found for this basename: LIPASE, AMYLASE,  in the last 168 hours CBC:  Recent Labs Lab  06/12/13 0450 06/13/13 0500 06/13/13 1500 06/15/13 0359 06/16/13 0610  WBC 20.1* 17.4* 17.1* 16.1* 14.2*  HGB 7.5* 6.5* 8.5* 7.1* 7.9*  HCT 22.6* 19.6* 25.5* 22.2* 23.3*  MCV 85.3 87.9 88.2 91.4 89.3  PLT 518* 476* 465* 389 310   Blood Culture    Component Value Date/Time   SDES BLOOD 06/10/2013 0629   SPECREQUEST BOTTLES DRAWN AEROBIC ONLY LEFT INTERNAL JUGULAR TLC DISTAL PORT  10CC 06/10/2013 0629   CULT  Value: NO GROWTH 5 DAYS Performed at [facility] 06/10/2013 0629   REPTSTATUS 06/15/2013 FINAL 06/10/2013 0629    Cardiac Enzymes: No results found for this basename: CKTOTAL, CKMB, CKMBINDEX, TROPONINI,  in the last 168 hours CBG:  Recent Labs Lab 06/14/13 1625 06/14/13 2134 06/15/13 0745 06/15/13 1202 06/16/13 0740  GLUCAP 126* 106* 81 105* 140*   Iron Studies:   Recent Labs  06/15/13 0900  IRON 19*  TIBC 126*   @lablastinr3 @ Studies/Results: No results found. Medications: . sodium chloride 10 mL/hr at 06/11/13 1300  . heparin 2,450 Units/hr (06/16/13 0007)   . darbepoetin (ARANESP) injection - DIALYSIS  150 mcg Intravenous Q Fri-HD  . docusate sodium  100 mg Oral BID  . feeding supplement (GLUCERNA SHAKE)  237 mL Oral TID BM  .  gabapentin  200 mg Oral TID  . insulin aspart  0-20 Units Subcutaneous TID WC  . insulin glargine  15 Units Subcutaneous QHS  . multivitamin  1 tablet Oral QHS  . piperacillin-tazobactam (ZOSYN)  IV  2.25 g Intravenous Q8H  . polyethylene glycol  17 g Oral Daily  . sevelamer carbonate  800 mg Oral TID WC  . warfarin  7.5 mg Oral ONCE-1800  . warfarin   Does not apply Once  . Warfarin - Pharmacist Dosing Inpatient   Does not apply [phone]

## 2013-06-16 NOTE — Progress Notes (Signed)
Lab unable to draw blood in right arm due to DVT and left arm due to HD assess.  Moshe Cipro, MD notified, and verbal order obtained to allow lab draws in right and left hands.  Lab notified, and stated they will draw appropriate labs as soon as possible.

## 2013-06-16 NOTE — Progress Notes (Signed)
Progress Note  Steven Logan [id number] DOB: 1970-06-25 DOA: 06/02/2013 PCP: Tivis Ringer, MD  Admit HPI / Brief Narrative: 43 y.o. M w/ a history of Morbid obesity; Gout; HTN; Dyslipidemia; Pulmonary embolism; Pancreatitis; Arthritis; Depression; Diabetes mellitus; and chronic anemia who presented with right ankle swelling for 2 weeks.  At first it was diagnosed as gout and he was started on prednisone without improvement. Patient reported that he later bumped his leg and a wound opened up and started to ooze pus. Patient subsequently re-presented to the ER.  An MRI of the ankle revealed gas-containing abscess and cellulitis. Orthopedics took him emergently to the OR for debridement. Pt also reported right arm swelling for 2 days.    HPI/Subjective: Patient complains of significant pain in right lower extremity stump-rated as 8/10, sharp/burning but denies phantom pain.  Assessment/Plan:  Severe sepsis due to septic ankle & abscess of right lower leg / MSSA necrotizing fasciitis  -Hemodynamically stabilized - had required multiple trips to OR for I&D - cultures revealed MSSA from wound/op site  - s/p transtibial amputation 5/19 -Ortho recommends continuing current anbxs until dc -will need extensive rehab after dc -started Neurontin for presumed neuropathic etiology to pain 5/20-increased to 200 TID 5/22  Acute nonocclusive DVT RUE -jugular, subclavian, axillary, and brachial veins involved  - avoid access/blood draws in R arm   - recent removal of HD vascath from right IJ 5/10 (had been in place since Aug 2014) -resumed Heparin post op - pharmacy dosing Warfarin  Anemia in chronic kidney disease / acute postoperative blood loss anemia  -Follow trend - did experience extensive bleeding from wound 5/16 morning per RN report (soaked through dressing and was dripping on bed/floor) necessitating temporary stop of heparin  - transfuse as needed to keep Hgb 7.0 or >  - amputation  should correct ongoing loss although did experience blood loss with OR so transfused 2 units 5/20 with post tx Hgb up to 8.5. Monitor daily CBCs.   Situational Depression - emotional support given and follow up chaplain visit requested 5/19  Poor venous access -has CL left IJ-once off Heparin rec place PIV and dc CL  Encephalopathy, toxic -Mental status waxing and waining -worse on HD days - at this point seems due to pain meds and his usual AMS post HD - Seems to have resolved.   End stage renal disease on HD M/W/F per Renal  Hypertension BP stable at present   DM / Hyperglycemia CBG improved - follow w/o change today - A1c 7.0 - does not appear to be on medical tx at home   Obesity - morbid - Body mass index is 28.45 kg/(m^2).  Code Status: FULL Family Communication:  Discussed with male friend at bedside.  Disposition Plan:  CIR pending bed availability and insurance approval.  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 rate 18, height 6\' 1"  (1.854 m), weight 97.8 kg (215 lb 9.8 oz), SpO2 99.00%.  Intake/Output Summary (Last 24 hours) at 06/16/13 1402 Last data filed at 06/16/13 0537  Gross per 24 hour  Intake    575 ml  Output   1285 ml  Net   -710 ml  Exam: General: No acute respiratory distress. Patient seen sitting on chair this morning, in mild to moderate painful distress.  Lungs: Clear to auscultation bilaterally. Cardiovascular: Regular rate and rhythm without murmur gallop or rub-improving peripheral edema  Abdomen: Nontender, soft, bowel sounds positive, no rebound, no ascites, no appreciable mass Extremities: RUE  edema essentially resolved; RLE dressed-minimally soiled in the posterior aspect but no obvious bleeding.   CNS: Alert and oriented. No focal deficits.  Data Reviewed: Basic Metabolic Panel:  Recent Labs Lab 06/12/13 0450 06/13/13 0500 06/13/13 0935 06/14/13 [id number] 06/15/13 0359 06/16/13 0610  NA 140 137  --  137 136* 135*  K 5.1 6.0* 4.3 4.7 4.7 3.8  CL 97 95*  --  96 95* 93*  CO2 26 24  --  26 24 27   GLUCOSE 111* 100*  --  110* 73 161*  BUN 52* 67*  --  32* 44* 26*  CREATININE 6.76* 8.69*  --  6.26* 8.24* 5.55*  CALCIUM 7.7* 7.5*  --  7.6* 7.3* 7.2*   Liver Function Tests: No results found for this basename: AST, ALT, ALKPHOS, BILITOT, PROT, ALBUMIN,  in the last 168 hours CBC:  Recent Labs Lab 06/12/13 0450 06/13/13 0500 06/13/13 1500 06/15/13 0359 06/16/13 0610  WBC 20.1* 17.4* 17.1* 16.1* 14.2*  HGB 7.5* 6.5* 8.5* 7.1* 7.9*  HCT 22.6* 19.6* 25.5* 22.2* 23.3*  MCV 85.3 87.9 88.2 91.4 89.3  PLT 518* 476* 465* 389 310   Cardiac Enzymes: No results found for this basename: CKTOTAL, CKMB, CKMBINDEX, TROPONINI,  in the last 168 hours CBG:  Recent Labs Lab 06/14/13 2134 06/15/13 0745 06/15/13 1202 06/16/13 0740 06/16/13 1142  GLUCAP 106* 81 105* 140* 133*    Recent Results (from the past 240 hour(s))  CULTURE, BLOOD (ROUTINE X 2)     Status: None   Collection Time    06/10/13  6:29 AM      Result Value Ref Range Status   Specimen Description BLOOD   Final   Special Requests     Final   Value: BOTTLES DRAWN AEROBIC ONLY LEFT INTERNAL JUGULAR TLC DISTAL PORT  10CC   Culture  Setup Time     Final   Value: 06/10/2013 14:40     Performed at [facility]   Culture     Final   Value: NO GROWTH 5 DAYS     Performed at [facility]   Report Status 06/15/2013 FINAL   Final     Studies:  Recent x-ray studies have been reviewed in detail by the Attending Physician  Scheduled Meds:  Scheduled Meds: . darbepoetin (ARANESP) injection -  DIALYSIS  150 mcg Intravenous Q Fri-HD  . docusate sodium  100 mg Oral BID  . feeding supplement (GLUCERNA SHAKE)  237 mL Oral TID BM  . [START ON 06/18/2013] ferric gluconate (FERRLECIT/NULECIT) IV  125 mg Intravenous Q M,W,F-HD  . gabapentin  200 mg Oral TID  . insulin aspart  0-20 Units Subcutaneous TID WC  . insulin glargine  15 Units Subcutaneous QHS  . labetalol  200 mg Oral BID  . multivitamin  1 tablet Oral QHS  . piperacillin-tazobactam (ZOSYN)  IV  2.25 g Intravenous Q8H  . polyethylene glycol  17 g Oral Daily  . sevelamer carbonate  800 mg Oral TID WC  . warfarin  7.5 mg Oral ONCE-1800  . warfarin   Does not apply Once  . Warfarin - Pharmacist Dosing Inpatient   Does not apply  [id number]    Time spent on care of this patient: 25 mins  Modena Jansky, MD, FACP, [facility]. [facility] Pager [phone]  If 7PM-7AM, please contact night-coverage [email or web address] Password [email or web address] 06/16/2013, 2:08 PM   06/16/2013, 2:02 PM   LOS: 14 days

## 2013-06-16 NOTE — Progress Notes (Signed)
ANTICOAGULATION CONSULT NOTE - Follow Up Consult  Pharmacy Consult for Heparin Indication: RUE DVT  No Known Allergies  Patient Measurements: Height: 6\' 1"  (185.4 cm) Weight: 215 lb 9.8 oz (97.8 kg) IBW/kg (Calculated) : 79.9 Heparin Dosing Weight: 103 kg  Vital Signs: Temp: 98.7 F (37.1 C) (05/23 0932) Temp src: Oral (05/23 0932) BP: 177/75 mmHg (05/23 0932) Pulse Rate: 109 (05/23 0932)  Labs:  Recent Labs  06/14/13 0608 06/15/13 0359  06/15/13 1801 06/16/13 0610 06/16/13 1800  HGB  --  7.1*  --   --  7.9*  --   HCT  --  22.2*  --   --  23.3*  --   PLT  --  389  --   --  310  --   LABPROT  --   --   --   --  14.7  --   INR  --   --   --   --  1.17  --   HEPARINUNFRC 0.53 0.11*  < > 0.57 0.33 0.30  CREATININE 6.26* 8.24*  --   --  5.55*  --   < > = values in this interval not displayed.  Estimated Creatinine Clearance: 21.1 ml/min (by C-G formula based on Cr of 5.55).  Assessment: Patient is a 79 YOM on heparin/warfarin bridge for RUE DVT. Heparin level 0.3 remains in goal range.   Goal of Therapy:  INR 2-3 Heparin level 0.3-0.5 Monitor platelets by anticoagulation protocol: Yes   Plan:  - Continue IV heparin 2450units/hr    Cyani Kallstrom S. Alford Highland, PharmD, [facility] Clinical Staff Pharmacist Pager [phone]  06/16/2013 6:55 PM

## 2013-06-16 NOTE — Progress Notes (Signed)
ANTICOAGULATION CONSULT NOTE - Follow Up Consult  Pharmacy Consult for Heparin/Warfarin Indication: RUE DVT  No Known Allergies  Patient Measurements: Height: 6\' 1"  (185.4 cm) Weight: 215 lb 9.8 oz (97.8 kg) IBW/kg (Calculated) : 79.9 Heparin Dosing Weight: 103 kg  Vital Signs: Temp: 98.6 F (37 C) (05/23 0629) Temp src: Oral (05/23 0629) BP: 178/86 mmHg (05/23 0629) Pulse Rate: 99 (05/23 0629)  Labs:  Recent Labs  06/13/13 1500  06/14/13 0608 06/15/13 0359 06/15/13 1234 06/15/13 1801 06/16/13 0610  HGB 8.5*  --   --  7.1*  --   --  7.9*  HCT 25.5*  --   --  22.2*  --   --  23.3*  PLT 465*  --   --  389  --   --  310  LABPROT  --   --   --   --   --   --  14.7  INR  --   --   --   --   --   --  1.17  HEPARINUNFRC  --   < > 0.53 0.11* 0.77* 0.57 0.33  CREATININE  --   --  6.26* 8.24*  --   --  5.55*  < > = values in this interval not displayed.  Estimated Creatinine Clearance: 21.1 ml/min (by C-G formula based on Cr of 5.55).  Assessment: Patient is a 69 YOM on heparin/warfarin bridge for RUE DVT. Heparin level this AM is therapeutic at 0.33, but has trended down on the same rate of heparin. Nurse reports no problems with IV heparin. INR is subtherapeutic at 1.17 after one 7.5mg  dose of warfarin. Hg 7.9, plt 310. No s/s of bleeding reported per nurse.   Goal of Therapy:  INR 2-3 Heparin level 0.3-0.5 Monitor platelets by anticoagulation protocol: Yes   Plan:  - Continue IV heparin 2450units/hr - Coumadin 7.5 mg PO x 1 dose - Heparin level at 1700 - Monitor daily CBC, heparin level, INR, and s/s of bleeding  Roderic Palau A. Pincus Badder, PharmD Clinical Pharmacist - Resident Pager: [phone] Pharmacy: [phone] 06/16/2013 8:50 AM

## 2013-06-16 NOTE — Progress Notes (Signed)
Physical Therapy Treatment Patient Details Name: Steven Logan MRN: [id number] DOB: October 01, 1970 Today's Date: 06/16/2013    History of Present Illness Pt is a 43 y/o male admitted with gas-containing abscess and cellulitis in RLE. Pt is currently NWB and another I&D is planned for 06/06/13. On 06/12/2013 pt to OR for R transtibial amputation.     PT Comments    Pt. Has overall generalized weakness  superimposed on recent R BKA which limits his mobility.  Additionally he does not appear that he can/will give max effort in mobility tasks.  He needs a lot of encouragement and reinforcement that he is progressing .  Will benefit from CIR level therapies if he is willing to do the work.    Follow Up Recommendations  CIR     Equipment Recommendations  Rolling walker with 5" wheels;3in1 (PT);Wheelchair (measurements PT);Wheelchair cushion (measurements PT)    Recommendations for Other Services Rehab consult     Precautions / Restrictions Precautions Precautions: Fall Precaution Comments: Better awareness of R LE in space today Restrictions Weight Bearing Restrictions: Yes RLE Weight Bearing: Non weight bearing    Mobility  Bed Mobility Overal bed mobility: Needs Assistance;+ 2 for safety/equipment Bed Mobility: Rolling;Sidelying to Sit Rolling: Mod assist Sidelying to sit: Mod assist;+2 for safety/equipment       General bed mobility comments: vc's needed for step by step directions to complete rolling and side to sit; vc's for use of both UEs to assist self to upright sitting,; required mod assist from therapist to right self  Transfers Overall transfer level: Needs assistance Equipment used: Rolling walker (2 wheeled) Transfers: Sit to/from Stand Sit to Stand: +2 physical assistance;Max assist Stand pivot transfers: Max assist;+2 physical assistance       General transfer comment: Pt. stood from bed with 2 assist then from reclier with 2 max assist.  Pt. unable to give  much effort from L LE to transition to stand even with bed height raised.  Little effort from either UE to assist.  +2 max assist to stand pivot to recliner  Ambulation/Gait Ambulation/Gait assistance:  (pt. unable)               Stairs            Wheelchair Mobility    Modified Rankin (Stroke Patients Only)       Balance                                    Cognition Arousal/Alertness: Awake/alert Behavior During Therapy: WFL for tasks assessed/performed Overall Cognitive Status: Impaired/Different from baseline Area of Impairment: Following commands;Safety/judgement;Problem solving     Memory: Decreased recall of precautions Following Commands: Follows one step commands consistently Safety/Judgement: Decreased awareness of safety;Decreased awareness of deficits   Problem Solving: Slow processing;Decreased initiation;Requires verbal cues General Comments: Pt. appears "childlike" at times in thought processes and seeking approval from therapist on his efforts during session    Exercises General Exercises - Lower Extremity Ankle Circles/Pumps: Left;AROM;5 reps Quad Sets: Both;5 reps;Supine Gluteal Sets: AROM;Both;10 reps Hip ABduction/ADduction: AROM;Right;5 reps;Supine    General Comments        Pertinent Vitals/Pain See vitals tab     Home Living                      Prior Function            PT Goals (  current goals can now be found in the care plan section) Progress towards PT goals: Progressing toward goals    Frequency  Min 4X/week    PT Plan Current plan remains appropriate    Co-evaluation             End of Session Equipment Utilized During Treatment: Gait belt Activity Tolerance: Patient tolerated treatment well Patient left: in chair;with call bell/phone within reach;with chair alarm set;with family/visitor present     Time: 0801-0827 PT Time Calculation (min): 26 min  Charges:  $Therapeutic  Exercise: 8-22 mins $Therapeutic Activity: 8-22 mins                    G CodesLadona Ridgel 06/16/2013, 9:56 AM Gerlean Ren PT Acute Rehab Services [phone] Beeper [phone]

## 2013-06-17 LAB — GLUCOSE, CAPILLARY
GLUCOSE-CAPILLARY: 75 mg/dL (ref 70–99)
GLUCOSE-CAPILLARY: 88 mg/dL (ref 70–99)
GLUCOSE-CAPILLARY: 144 mg/dL — AB (ref 70–99)
Glucose-Capillary: 103 mg/dL — ABNORMAL HIGH (ref 70–99)

## 2013-06-17 LAB — CBC
HCT: 22.3 % — ABNORMAL LOW (ref 39.0–52.0)
HEMOGLOBIN: 7.4 g/dL — AB (ref 13.0–17.0)
MCH: 29.8 pg (ref 26.0–34.0)
MCHC: 33.2 g/dL (ref 30.0–36.0)
MCV: 89.9 fL (ref 78.0–100.0)
Platelets: 313 10*3/uL (ref 150–400)
RBC: 2.48 MIL/uL — ABNORMAL LOW (ref 4.22–5.81)
RDW: 14.8 % (ref 11.5–15.5)
WBC: 13.8 10*3/uL — ABNORMAL HIGH (ref 4.0–10.5)

## 2013-06-17 LAB — BASIC METABOLIC PANEL
BUN: 36 mg/dL — AB (ref 6–23)
CHLORIDE: 94 meq/L — AB (ref 96–112)
CO2: 25 mEq/L (ref 19–32)
CREATININE: 7.49 mg/dL — AB (ref 0.50–1.35)
Calcium: 6.9 mg/dL — ABNORMAL LOW (ref 8.4–10.5)
GFR calc Af Amer: 9 mL/min — ABNORMAL LOW (ref >90)
GFR calc non Af Amer: 8 mL/min — ABNORMAL LOW (ref >90)
Glucose, Bld: 75 mg/dL (ref 70–99)
Potassium: 4.4 mEq/L (ref 3.7–5.3)
Sodium: 134 mEq/L — ABNORMAL LOW (ref 137–147)

## 2013-06-17 LAB — CLOSTRIDIUM DIFFICILE BY PCR: Toxigenic C. Difficile by PCR: NEGATIVE

## 2013-06-17 LAB — PROTIME-INR
INR: 1.3 (ref 0.00–1.49)
Prothrombin Time: 15.9 seconds — ABNORMAL HIGH (ref 11.6–15.2)

## 2013-06-17 LAB — HEPARIN LEVEL (UNFRACTIONATED): Heparin Unfractionated: 0.36 IU/mL (ref 0.30–0.70)

## 2013-06-17 MED ORDER — SODIUM CHLORIDE 0.9 % IJ SOLN
10.0000 mL | INTRAMUSCULAR | Status: DC | PRN
  Administered 2013-06-18 – 2013-06-24 (×7): 10 mL
  Administered 2013-06-24: 20 mL
  Administered 2013-06-25 – 2013-06-26 (×3): 10 mL
  Administered 2013-06-27 – 2013-06-30 (×4): 20 mL
  Administered 2013-06-30: 30 mL
  Administered 2013-07-01 (×2): 20 mL

## 2013-06-17 MED ORDER — WARFARIN SODIUM 7.5 MG PO TABS
7.5000 mg | ORAL_TABLET | Freq: Once | ORAL | Status: AC
  Administered 2013-06-17: 7.5 mg via ORAL
  Filled 2013-06-17: qty 1

## 2013-06-17 MED ORDER — SODIUM CHLORIDE 0.9 % IJ SOLN
10.0000 mL | Freq: Two times a day (BID) | INTRAMUSCULAR | Status: DC
  Administered 2013-06-18 – 2013-07-01 (×4): 10 mL

## 2013-06-17 NOTE — Progress Notes (Signed)
ANTICOAGULATION CONSULT NOTE - Follow Up Consult  Pharmacy Consult for Heparin/Warfarin Indication: RUE DVT  No Known Allergies  Patient Measurements: Height: 6\' 1"  (185.4 cm) Weight: 218 lb (98.884 kg) IBW/kg (Calculated) : 79.9 Heparin Dosing Weight: 103 kg  Vital Signs: Temp: 101 F (38.3 C) (05/24 0613) Temp src: Oral (05/24 0613) BP: 179/68 mmHg (05/24 0613) Pulse Rate: 103 (05/24 0613)  Labs:  Recent Labs  06/15/13 0359  06/16/13 0610 06/16/13 1800 06/17/13 0653  HGB 7.1*  --  7.9*  --  7.4*  HCT 22.2*  --  23.3*  --  22.3*  PLT 389  --  310  --  313  LABPROT  --   --  14.7  --  15.9*  INR  --   --  1.17  --  1.30  HEPARINUNFRC 0.11*  < > 0.33 0.30 0.36  CREATININE 8.24*  --  5.55*  --  7.49*  < > = values in this interval not displayed.  Estimated Creatinine Clearance: 15.7 ml/min (by C-G formula based on Cr of 7.49).  Assessment: Patient is a 72 YOM on heparin/warfarin bridge for RUE DVT. Heparin level this AM continues to be therapeutic at 0.36. INR is subtherapeutic at 1.30 after two 7.5mg  doses of warfarin. Hg 7.4, plt 313. No s/s of bleeding reported per nurse.   Goal of Therapy:  INR 2-3 Heparin level 0.3-0.5 Monitor platelets by anticoagulation protocol: Yes   Plan:  - Continue IV heparin 2450units/hr - Coumadin 7.5 mg PO x 1 dose - Monitor daily CBC, heparin level, INR, and s/s of bleeding  Delena Casebeer A. Pincus Badder, PharmD Clinical Pharmacist - Resident Pager: [phone] Pharmacy: [phone] 06/17/2013 9:22 AM

## 2013-06-17 NOTE — Discharge Instructions (Signed)
Changed compressive dressing right lower extremity as needed. Okay to bathe and shower and get incision wet.  Information on my medicine - Coumadin   (Warfarin)  This medication education was reviewed with me or my healthcare representative as part of my discharge preparation.  The pharmacist that spoke with me during my hospital stay was:  Ace Gins, [facility]  Why was Coumadin prescribed for you? Coumadin was prescribed for you because you have a blood clot or a medical condition that can cause an increased risk of forming blood clots. Blood clots can cause serious health problems by blocking the flow of blood to the heart, lung, or brain. Coumadin can prevent harmful blood clots from forming. As a reminder your indication for Coumadin is:   Deep Vein Thrombosis Treatment  What test will check on my response to Coumadin? While on Coumadin (warfarin) you will need to have an INR test regularly to ensure that your dose is keeping you in the desired range. The INR (international normalized ratio) number is calculated from the result of the laboratory test called prothrombin time (PT).  If an INR APPOINTMENT HAS NOT ALREADY BEEN MADE FOR YOU please schedule an appointment to have this lab work done by your health care provider within 7 days. Your INR goal is usually a number between:  2 to 3 or your provider may give you a more narrow range like 2-2.5.  Ask your health care provider during an office visit what your goal INR is.  What  do you need to  know  About  COUMADIN? Take Coumadin (warfarin) exactly as prescribed by your healthcare provider about the same time each day.  DO NOT stop taking without talking to the doctor who prescribed the medication.  Stopping without other blood clot prevention medication to take the place of Coumadin may increase your risk of developing a new clot or stroke.  Get refills before you run out.  What do you do if you miss a dose? If you miss a dose, take it as  soon as you remember on the same day then continue your regularly scheduled regimen the next day.  Do not take two doses of Coumadin at the same time.  Important Safety Information A possible side effect of Coumadin (Warfarin) is an increased risk of bleeding. You should call your healthcare provider right away if you experience any of the following:   Bleeding from an injury or your nose that does not stop.   Unusual colored urine (red or dark brown) or unusual colored stools (red or black).   Unusual bruising for unknown reasons.   A serious fall or if you hit your head (even if there is no bleeding).  Some foods or medicines interact with Coumadin (warfarin) and might alter your response to warfarin. To help avoid this:   Eat a balanced diet, maintaining a consistent amount of Vitamin K.   Notify your provider about major diet changes you plan to make.   Avoid alcohol or limit your intake to 1 drink for women and 2 drinks for men per day. (1 drink is 5 oz. wine, 12 oz. beer, or 1.5 oz. liquor.)  Make sure that ANY health care provider who prescribes medication for you knows that you are taking Coumadin (warfarin).  Also make sure the healthcare provider who is monitoring your Coumadin knows when you have started a new medication including herbals and non-prescription products.  Coumadin (Warfarin)  Major Drug Interactions  Increased Warfarin  Effect Decreased Warfarin Effect  Alcohol (large quantities) Antibiotics (esp. Septra/Bactrim, Flagyl, Cipro) Amiodarone (Cordarone) Aspirin (ASA) Cimetidine (Tagamet) Megestrol (Megace) NSAIDs (ibuprofen, naproxen, etc.) Piroxicam (Feldene) Propafenone (Rythmol SR) Propranolol (Inderal) Isoniazid (INH) Posaconazole (Noxafil) Barbiturates (Phenobarbital) Carbamazepine (Tegretol) Chlordiazepoxide (Librium) Cholestyramine (Questran) Griseofulvin Oral Contraceptives Rifampin Sucralfate (Carafate) Vitamin K   Coumadin (Warfarin) Major  Herbal Interactions  Increased Warfarin Effect Decreased Warfarin Effect  Garlic Ginseng Ginkgo biloba Coenzyme Q10 Green tea St. Johns wort    Coumadin (Warfarin) FOOD Interactions  Eat a consistent number of servings per week of foods HIGH in Vitamin K (1 serving =  cup)  Collards (cooked, or boiled & drained) Kale (cooked, or boiled & drained) Mustard greens (cooked, or boiled & drained) Parsley *serving size only =  cup Spinach (cooked, or boiled & drained) Swiss chard (cooked, or boiled & drained) Turnip greens (cooked, or boiled & drained)  Eat a consistent number of servings per week of foods MEDIUM-HIGH in Vitamin K (1 serving = 1 cup)  Asparagus (cooked, or boiled & drained) Broccoli (cooked, boiled & drained, or raw & chopped) Brussel sprouts (cooked, or boiled & drained) *serving size only =  cup Lettuce, raw (green leaf, endive, romaine) Spinach, raw Turnip greens, raw & chopped   These websites have more information on Coumadin (warfarin):  FailFactory.se; VeganReport.com.au;

## 2013-06-17 NOTE — Progress Notes (Signed)
Progress Note  Steven Logan [id number] DOB: [birth year] DOA: 06/02/2013 PCP: Tivis Ringer, MD  Admit HPI / Brief Narrative: 43 y.o. M w/ a history of Morbid obesity; Gout; HTN; Dyslipidemia; Pulmonary embolism; Pancreatitis; Arthritis; Depression; Diabetes mellitus; and chronic anemia who presented with right ankle swelling for 2 weeks.  At first it was diagnosed as gout and he was started on prednisone without improvement. Patient reported that he later bumped his leg and a wound opened up and started to ooze pus. Patient subsequently re-presented to the ER.  An MRI of the ankle revealed gas-containing abscess and cellulitis. Orthopedics took him emergently to the OR for debridement. Pt also reported right arm swelling for 2 days.    HPI/Subjective: Patient states that pain is better controlled in the daytime and has some worsening at night.  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 Sharol Given 5/24 and op site follow up appreciated.  Acute nonocclusive DVT RUE -jugular, subclavian, axillary, and brachial veins involved  - avoid access/blood draws in R arm   - recent removal of HD vascath from right IJ 5/10 (had been in place since Aug 2014) -resumed Heparin post op - pharmacy dosing Warfarin  Anemia in chronic kidney disease / acute postoperative blood loss anemia  -Follow trend - did experience extensive bleeding from wound 5/16 morning per RN report (soaked through dressing and was dripping on bed/floor) necessitating temporary stop  of heparin  - transfuse as needed to keep Hgb 7.0 or >  - amputation should correct ongoing loss although did experience blood loss with OR so transfused 2 units 5/20 with post tx Hgb up to 8.5. Stable.  Situational Depression - emotional support given and follow up chaplain visit requested 5/19  Poor venous access -has CL left IJ-once off Heparin rec place PIV and dc CL  Encephalopathy, toxic -Mental status was waxing and waining -worse on HD days - at this point seems due to pain meds and his usual AMS post HD - Seems to have resolved.   End stage renal disease on HD M/W/F per Renal  Hypertension BP stable at present   DM / Hyperglycemia CBG improved - follow w/o change today - A1c 7.0 - does not appear to be on medical tx at home   Obesity - morbid - Body mass index is 28.77 kg/(m^2).  Code Status: FULL Family Communication:  None at bedside. Disposition Plan:  CIR pending bed availability and insurance approval.  Consultants: Ortho Nephro  Procedures: I and D on 06/03/2013  IRRIGATION AND DEBRIDEMENT EXTREMITY extensive excision of muscle fascia and soft tissue right leg 06/06/13  Transtibial amputation 5/19  Antibiotics: Vanc 5/9 >5/17 Zosyn 5/9 >  DVT prophylaxis: IV heparin   Objective: Blood pressure 179/68, pulse 103, temperature 101 F (38.3 C), temperature source Oral, resp. rate 18, height 6\' 1"  (1.854 m), weight 98.884 kg (218 lb), SpO2 99.00%. No intake or output data in the 24 hours ending 06/17/13 1224 Exam: General: No acute respiratory distress. Lying in bed. Lungs: Clear to auscultation bilaterally. Cardiovascular: Regular rate and rhythm without murmur  gallop or rub-improving peripheral edema  Abdomen: Nontender, soft, bowel sounds positive, no rebound, no ascites, no appreciable mass Extremities: RUE edema essentially resolved; RLE dressed-minimally soiled in the posterior aspect but no obvious bleeding- seen prior to ortho f/u.   CNS:  Alert and oriented. No focal deficits.  Data Reviewed: Basic Metabolic Panel:  Recent Labs Lab 06/13/13 0500 06/13/13 0935 06/14/13 [id number] 06/15/13 0359 06/16/13 0610 06/17/13 0653  NA 137  --  137 136* 135* 134*  K 6.0* 4.3 4.7 4.7 3.8 4.4  CL 95*  --  96 95* 93* 94*  CO2 24  --  26 24 27 25   GLUCOSE 100*  --  110* 73 161* 75  BUN 67*  --  32* 44* 26* 36*  CREATININE 8.69*  --  6.26* 8.24* 5.55* 7.49*  CALCIUM 7.5*  --  7.6* 7.3* 7.2* 6.9*   Liver Function Tests: No results found for this basename: AST, ALT, ALKPHOS, BILITOT, PROT, ALBUMIN,  in the last 168 hours CBC:  Recent Labs Lab 06/13/13 0500 06/13/13 1500 06/15/13 0359 06/16/13 0610 06/17/13 0653  WBC 17.4* 17.1* 16.1* 14.2* 13.8*  HGB 6.5* 8.5* 7.1* 7.9* 7.4*  HCT 19.6* 25.5* 22.2* 23.3* 22.3*  MCV 87.9 88.2 91.4 89.3 89.9  PLT 476* 465* 389 310 313   Cardiac Enzymes: No results found for this basename: CKTOTAL, CKMB, CKMBINDEX, TROPONINI,  in the last 168 hours CBG:  Recent Labs Lab 06/16/13 1142 06/16/13 1723 06/16/13 2114 06/17/13 0724 06/17/13 1135  GLUCAP 133* 105* 150* 75 88    Recent Results (from the past 240 hour(s))  CULTURE, BLOOD (ROUTINE X 2)     Status: None   Collection Time    06/10/13  6:29 AM      Result Value Ref Range Status   Specimen Description BLOOD   Final   Special Requests     Final   Value: BOTTLES DRAWN AEROBIC ONLY LEFT INTERNAL JUGULAR TLC DISTAL PORT  10CC   Culture  Setup Time     Final   Value: 06/10/2013 14:40     Performed at [facility]   Culture     Final   Value: NO GROWTH 5 DAYS     Performed at [facility]   Report Status 06/15/2013 FINAL   Final     Studies:  Recent x-ray studies have been reviewed in detail by the Attending Physician  Scheduled Meds:  Scheduled Meds: . darbepoetin (ARANESP) injection - DIALYSIS  150 mcg Intravenous Q Fri-HD  . docusate sodium  100 mg Oral BID  . feeding supplement (GLUCERNA SHAKE)  237 mL  Oral TID BM  . [START ON 06/18/2013] ferric gluconate (FERRLECIT/NULECIT) IV  125 mg Intravenous Q M,W,F-HD  . gabapentin  200 mg Oral TID  . insulin aspart  0-20 Units Subcutaneous TID WC  . insulin glargine  15 Units Subcutaneous QHS  . labetalol  200 mg Oral BID  . multivitamin  1 tablet Oral QHS  . piperacillin-tazobactam (ZOSYN)  IV  2.25 g Intravenous Q8H  . polyethylene glycol  17 g Oral Daily  . sevelamer carbonate  800 mg Oral TID WC  . warfarin  7.5 mg Oral ONCE-1800  . warfarin   Does not apply Once  . Warfarin - Pharmacist Dosing Inpatient   Does not apply q1800    Time spent on care of this patient: 25 mins  Modena Jansky, MD, FACP, [facility]. [facility] Pager [phone]  If 7PM-7AM,  please contact night-coverage [email or web address] Password [email or web address] 06/17/2013, 12:24 PM   06/17/2013, 12:24 PM   LOS: 15 days

## 2013-06-17 NOTE — Progress Notes (Signed)
Subjective:   No complaints. Had temp last night  Objective Filed Vitals:   06/16/13 0629 06/16/13 0932 06/16/13 2048 06/17/13 0613  BP: 178/86 177/75 141/83 179/68  Pulse: 99 109 72 103  Temp: 98.6 F (37 C) 98.7 F (37.1 C) 99.9 F (37.7 C) 101 F (38.3 C)  TempSrc: Oral Oral Oral Oral  Resp: 18 18 17 18   Height:      Weight:   98.884 kg (218 lb)   SpO2: 94% 99% 97% 99%   Physical Exam General: no distress, calm Heart: RRR Lungs: CTA. unlabored Abdomen: soft, nontender +BS Extremities: R BKA wrapped Access:  L AVF patent on HD  HD: [facility]  4.5h 111.5kg 2/2.5 Bath 500/800 Heparin 11000 LFA AVF  Calcitriol 1.25ug EPO 8000 Venofer s/p load completed 5/11   Assessment: 1. 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 ESRD on HD resuming low dose hep 2500 x 2 prn 3. RUE DVT- heparin gtt per pharm, coumadin started yesterday 3. Anemia - hgb 7.1, has had 3u prbc transfused, on aranesp 150/wk, Fe low > 1gm load in progress 4. HPTH- Ca+ 7.5. Cont calcitriol renvela 5. HTN/volume - BP's up, resumed labetalol, UF with next HD on Monday 6. Nutrition -  Clears, Nepro, mulitivit. Poor appetite 7. Dispo- prob to CIR  Plan- HD Monday, IV Fe, aranesp, UF as tol w HD tomorrow  Kelly Splinter MD pager [phone]    cell [phone] 06/17/2013, 11:09 AM   Additional Objective Labs: Basic Metabolic Panel:  Recent Labs Lab 06/15/13 0359 06/16/13 0610 06/17/13 0653  NA 136* 135* 134*  K 4.7 3.8 4.4  CL 95* 93* 94*  CO2 24 27 25   GLUCOSE 73 161* 75  BUN 44* 26* 36*  CREATININE 8.24* 5.55* 7.49*  CALCIUM 7.3* 7.2* 6.9*   Liver Function Tests: No results found for this basename: AST, ALT, ALKPHOS, BILITOT, PROT, ALBUMIN,  in the last 168 hours No results found for this basename: LIPASE, AMYLASE,  in the last 168 hours CBC:  Recent Labs Lab 06/13/13 0500 06/13/13 1500 06/15/13 0359 06/16/13 0610  06/17/13 0653  WBC 17.4* 17.1* 16.1* 14.2* 13.8*  HGB 6.5* 8.5* 7.1* 7.9* 7.4*  HCT 19.6* 25.5* 22.2* 23.3* 22.3*  MCV 87.9 88.2 91.4 89.3 89.9  PLT 476* 465* 389 310 313   Blood Culture    Component Value Date/Time   SDES BLOOD 06/10/2013 0629   SPECREQUEST BOTTLES DRAWN AEROBIC ONLY LEFT INTERNAL JUGULAR TLC DISTAL PORT  10CC 06/10/2013 0629   CULT  Value: NO GROWTH 5 DAYS Performed at [facility] 06/10/2013 0629   REPTSTATUS 06/15/2013 FINAL 06/10/2013 0629    Cardiac Enzymes: No results found for this basename: CKTOTAL, CKMB, CKMBINDEX, TROPONINI,  in the last 168 hours CBG:  Recent Labs Lab 06/15/13 1202 06/16/13 0740 06/16/13 1142 06/16/13 1723 06/16/13 2114  GLUCAP 105* 140* 133* 105* 150*   Iron Studies:   Recent Labs  06/15/13 0900  IRON 19*  TIBC 126*   @lablastinr3 @ Studies/Results: No results found. Medications: . sodium chloride 10 mL/hr at 06/11/13 1300  . heparin 2,450 Units/hr (06/17/13 1010)   . darbepoetin (ARANESP) injection - DIALYSIS  150 mcg Intravenous Q Fri-HD  . docusate sodium  100 mg Oral BID  . feeding supplement (GLUCERNA SHAKE)  237 mL Oral TID BM  . [START ON 06/18/2013] ferric gluconate (FERRLECIT/NULECIT) IV  125 mg Intravenous  Q M,W,F-HD  . gabapentin  200 mg Oral TID  . insulin aspart  0-20 Units Subcutaneous TID WC  . insulin glargine  15 Units Subcutaneous QHS  . labetalol  200 mg Oral BID  . multivitamin  1 tablet Oral QHS  . piperacillin-tazobactam (ZOSYN)  IV  2.25 g Intravenous Q8H  . polyethylene glycol  17 g Oral Daily  . sevelamer carbonate  800 mg Oral TID WC  . warfarin  7.5 mg Oral ONCE-1800  . warfarin   Does not apply Once  . Warfarin - Pharmacist Dosing Inpatient   Does not apply [phone]

## 2013-06-17 NOTE — Progress Notes (Signed)
Patient's temp 101, gave tylenol per PRN order.  MD notified.

## 2013-06-17 NOTE — Progress Notes (Signed)
Patient ID: Steven Logan, male   DOB: Apr 24, 1970, 43 y.o.   MRN: [id number] Right transtibial amputation dressing removed. Patient's wound edges are healing nicely. There is no purulence no signs of infection. There is a very large superficial blister with serous fluid on the distal aspect of the flap. This was drained and a new dry dressing was applied. Patient will need dry dressing changes daily with 4 x 4 ABDs and an Ace wrap. Orders are written for dressing changes. Patient was given instructions in the importance of knee extension exercises.

## 2013-06-18 ENCOUNTER — Inpatient Hospital Stay (HOSPITAL_COMMUNITY): Payer: BC Managed Care – PPO

## 2013-06-18 LAB — CBC
HCT: 19.8 % — ABNORMAL LOW (ref 39.0–52.0)
HEMATOCRIT: 21.8 % — AB (ref 39.0–52.0)
HEMOGLOBIN: 7.3 g/dL — AB (ref 13.0–17.0)
Hemoglobin: 6.7 g/dL — CL (ref 13.0–17.0)
MCH: 30.5 pg (ref 26.0–34.0)
MCH: 29.6 pg (ref 26.0–34.0)
MCHC: 33.8 g/dL (ref 30.0–36.0)
MCHC: 33.5 g/dL (ref 30.0–36.0)
MCV: 90 fL (ref 78.0–100.0)
MCV: 88.3 fL (ref 78.0–100.0)
Platelets: 330 10*3/uL (ref 150–400)
Platelets: 274 10*3/uL (ref 150–400)
RBC: 2.2 MIL/uL — ABNORMAL LOW (ref 4.22–5.81)
RBC: 2.47 MIL/uL — ABNORMAL LOW (ref 4.22–5.81)
RDW: 14.7 % (ref 11.5–15.5)
RDW: 14.6 % (ref 11.5–15.5)
WBC: 15.6 10*3/uL — ABNORMAL HIGH (ref 4.0–10.5)
WBC: 14.5 10*3/uL — ABNORMAL HIGH (ref 4.0–10.5)

## 2013-06-18 LAB — BASIC METABOLIC PANEL
BUN: 44 mg/dL — ABNORMAL HIGH (ref 6–23)
CO2: 24 mEq/L (ref 19–32)
CREATININE: 8.98 mg/dL — AB (ref 0.50–1.35)
Calcium: 6.8 mg/dL — ABNORMAL LOW (ref 8.4–10.5)
Chloride: 94 mEq/L — ABNORMAL LOW (ref 96–112)
GFR, EST AFRICAN AMERICAN: 7 mL/min — AB (ref >90)
GFR, EST NON AFRICAN AMERICAN: 6 mL/min — AB (ref >90)
Glucose, Bld: 93 mg/dL (ref 70–99)
Potassium: 4.5 mEq/L (ref 3.7–5.3)
Sodium: 134 mEq/L — ABNORMAL LOW (ref 137–147)

## 2013-06-18 LAB — PROTIME-INR
INR: 1.82 — AB (ref 0.00–1.49)
PROTHROMBIN TIME: 20.5 s — AB (ref 11.6–15.2)

## 2013-06-18 LAB — HEPARIN LEVEL (UNFRACTIONATED): HEPARIN UNFRACTIONATED: 0.38 [IU]/mL (ref 0.30–0.70)

## 2013-06-18 LAB — PREPARE RBC (CROSSMATCH)

## 2013-06-18 LAB — GLUCOSE, CAPILLARY
GLUCOSE-CAPILLARY: 91 mg/dL (ref 70–99)
Glucose-Capillary: 88 mg/dL (ref 70–99)
Glucose-Capillary: 119 mg/dL — ABNORMAL HIGH (ref 70–99)

## 2013-06-18 MED ORDER — SODIUM CHLORIDE 0.9 % IV SOLN: 100.0000 mL | INTRAVENOUS | Status: DC | PRN

## 2013-06-18 MED ORDER — WARFARIN SODIUM 5 MG PO TABS
5.0000 mg | ORAL_TABLET | Freq: Once | ORAL | Status: AC
  Administered 2013-06-18: 5 mg via ORAL
  Filled 2013-06-18: qty 1

## 2013-06-18 MED ORDER — LIDOCAINE HCL (PF) 1 % IJ SOLN: 5.0000 mL | INTRAMUSCULAR | Status: DC | PRN

## 2013-06-18 MED ORDER — LIDOCAINE-PRILOCAINE 2.5-2.5 % EX CREA: 1.0000 "application " | TOPICAL_CREAM | CUTANEOUS | Status: DC | PRN

## 2013-06-18 MED ORDER — ALTEPLASE 2 MG IJ SOLR
2.0000 mg | Freq: Once | INTRAMUSCULAR | Status: AC | PRN
  Filled 2013-06-18: qty 2

## 2013-06-18 MED ORDER — HEPARIN SODIUM (PORCINE) 1000 UNIT/ML DIALYSIS
1000.0000 [IU] | INTRAMUSCULAR | Status: DC | PRN
  Filled 2013-06-18: qty 1

## 2013-06-18 MED ORDER — NEPRO/CARBSTEADY PO LIQD: 237.0000 mL | ORAL | Status: DC | PRN

## 2013-06-18 MED ORDER — HEPARIN SODIUM (PORCINE) 1000 UNIT/ML DIALYSIS
2500.0000 [IU] | INTRAMUSCULAR | Status: DC | PRN
  Administered 2013-06-20: 2500 [IU] via INTRAVENOUS_CENTRAL
  Filled 2013-06-18: qty 3

## 2013-06-18 MED ORDER — HYDROMORPHONE HCL PF 1 MG/ML IJ SOLN
INTRAMUSCULAR | Status: AC
  Administered 2013-06-18: 1 mg via INTRAVENOUS
  Filled 2013-06-18: qty 1

## 2013-06-18 MED ORDER — PENTAFLUOROPROP-TETRAFLUOROETH EX AERO: 1.0000 "application " | INHALATION_SPRAY | CUTANEOUS | Status: DC | PRN

## 2013-06-18 NOTE — Progress Notes (Signed)
ANTICOAGULATION CONSULT NOTE - Follow Up Consult  Pharmacy Consult for Heparin/Warfarin Indication: RUE DVT  No Known Allergies  Patient Measurements: Height: 6\' 1"  (185.4 cm) Weight: 216 lb (97.977 kg) IBW/kg (Calculated) : 79.9 Heparin Dosing Weight: 103 kg  Vital Signs: Temp: 100 F (37.8 C) (05/25 0628) Temp src: Oral (05/25 0628) BP: 182/82 mmHg (05/25 0628) Pulse Rate: 105 (05/25 0628)  Labs:  Recent Labs  06/16/13 0610 06/16/13 1800 06/17/13 0653 06/18/13 0630  HGB 7.9*  --  7.4* 6.7*  HCT 23.3*  --  22.3* 19.8*  PLT 310  --  313 330  LABPROT 14.7  --  15.9* 20.5*  INR 1.17  --  1.30 1.82*  HEPARINUNFRC 0.33 0.30 0.36 0.38  CREATININE 5.55*  --  7.49* 8.98*    Estimated Creatinine Clearance: 13.1 ml/min (by C-G formula based on Cr of 8.98).  Assessment: Patient is a 48 YOM on heparin/warfarin bridge for RUE DVT. Heparin level this AM continues to be therapeutic at 0.38. INR is subtherapeutic at 1.82 after three 7.5mg  doses of warfarin, but the level did increase from 1.3 yesterday.  Hg 6.7, plt 330. Spoke with Dr. Algis Liming about Hg level and he wants to continue both heparin and warfarin with plans to transfuse in HD. No s/s of bleeding reported by the nurse.  Goal of Therapy:  INR 2-3 Heparin level 0.3-0.5 Monitor platelets by anticoagulation protocol: Yes   Plan:  - Continue IV heparin 2450units/hr - Coumadin 5mg  PO x 1 dose - Monitor daily CBC, heparin level, INR, and s/s of bleeding  Terriyah Westra A. Pincus Badder, PharmD Clinical Pharmacist - Resident Pager: [phone] Pharmacy: [phone] 06/18/2013 8:07 AM

## 2013-06-18 NOTE — Clinical Social Work Note (Signed)
Pt transferred from [facility] to [place].  Report given to [place]. 2C CSW now signing off.  Nonnie Done, [place] [phone]  Clinical Social Work

## 2013-06-18 NOTE — Progress Notes (Signed)
Progress Note  Steven Logan [id number] DOB: 01/03/1971 DOA: 06/02/2013 PCP: Tivis Ringer, MD  Admit HPI / Brief Narrative: 43 y.o. M w/ a history of Morbid obesity; Gout; HTN; Dyslipidemia; Pulmonary embolism; Pancreatitis; Arthritis; Depression; Diabetes mellitus; and chronic anemia who presented with right ankle swelling for 2 weeks.  At first it was diagnosed as gout and he was started on prednisone without improvement. Patient reported that he later bumped his leg and a wound opened up and started to ooze pus. Patient subsequently re-presented to the ER.  An MRI of the ankle revealed gas-containing abscess and cellulitis. Orthopedics took him emergently to the OR for debridement. He is status post right lower extremity transtibial amputation, remains on IV Zosyn. He was diagnosed with acute nonocclusive DVT right upper extremity and is on heparin bridging and Coumadin. Spiking intermittent fevers despite antibiotics-unclear etiology.  HPI/Subjective: Patient was seen at hemodialysis. Denied complaints but recognized intermittent fevers. Denies cough, dyspnea, nausea, vomiting, diarrhea. Makes small amounts of urine but denies dysuria.  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 Sharol Given 5/24 and op site follow up appreciated. - Continues to have intermittent fevers-currently on IV Zosyn. Chest x-ray without acute abnormalities. Followup repeat blood cultures drawn 5/25. If continues to have fevers, consider ID consultation and orthopedics  to continue to closely follow right lower extremity amputation site as source of fevers. Fevers could be due to extensive right upper extremity DVT.  Acute nonocclusive DVT RUE -jugular, subclavian, axillary, and brachial veins involved  - avoid access/blood draws in R arm   - recent removal of HD vascath from right IJ 5/10 (had been in place since Aug 2014) -resumed Heparin post op - pharmacy dosing Warfarin. INR on 5/25:1.82  Anemia in chronic kidney disease / acute postoperative blood loss anemia  -Follow trend - did experience extensive bleeding from wound 5/16 morning per RN report (soaked through dressing and was dripping on bed/floor) necessitating temporary stop of heparin  - transfuse as needed to keep Hgb 7.0 or >  - Hemoglobin was reasonably stable in the low 7 g per DL range but dropped to 6.7 on 5/25. No overt bleeding. Status post 2 units PRBC on 5/25 across dialysis. Follow CBC in a.m.  Situational Depression - emotional support given and follow up chaplain visit requested 5/19  Poor venous access -has CL left IJ-once off Heparin rec place PIV and dc CL  Encephalopathy, toxic -Mental status was waxing and waining -worse on HD days - at this point seems due to pain meds and his usual AMS post HD - Seems to have resolved.   End stage renal disease on HD M/W/F per Renal  Hypertension BP stable at present   DM / Hyperglycemia CBG improved - follow w/o change today - A1c 7.0 - does not appear to be on medical tx at home   Obesity - morbid - Body mass index is 28.69 kg/(m^2).  Code Status: FULL Family Communication:  None at bedside. Disposition Plan:  CIR pending bed availability and insurance approval.  Consultants: Ortho Nephro  Procedures: I and  D on 06/03/2013  IRRIGATION AND DEBRIDEMENT EXTREMITY extensive excision of muscle fascia and soft tissue right leg 06/06/13  Transtibial amputation 5/19  Antibiotics: Vanc 5/9 >5/17 Zosyn 5/9 >  DVT  prophylaxis: IV heparin and Coumadin.  Objective: Blood pressure 178/91, pulse 106, temperature 99.3 F (37.4 C), temperature source Oral, resp. rate 20, height 6\' 1"  (1.854 m), weight 98.6 kg (217 lb 6 oz), SpO2 99.00%.  Intake/Output Summary (Last 24 hours) at 06/18/13 1507 Last data filed at 06/18/13 1316  Gross per 24 hour  Intake    695 ml  Output   2933 ml  Net  -2238 ml   Exam: General: No acute respiratory distress. Lying in bed undergoing hemodialysis. Does not look septic or toxic. Lungs: Clear to auscultation bilaterally. Cardiovascular: Regular rate and rhythm without murmur gallop or rub-improving peripheral edema  Abdomen: Nontender, soft, bowel sounds positive, no rebound, no ascites, no appreciable mass Extremities: RUE edema essentially resolved; RLE dressed-had been changed by ortho- mild soiling- daily dressing  CNS: Alert and oriented. No focal deficits.  Data Reviewed: Basic Metabolic Panel:  Recent Labs Lab 06/14/13 0608 06/15/13 0359 06/16/13 0610 06/17/13 0653 06/18/13 0630  NA 137 136* 135* 134* 134*  K 4.7 4.7 3.8 4.4 4.5  CL 96 95* 93* 94* 94*  CO2 26 24 27 25 24   GLUCOSE 110* 73 161* 75 93  BUN 32* 44* 26* 36* 44*  CREATININE 6.26* 8.24* 5.55* 7.49* 8.98*  CALCIUM 7.6* 7.3* 7.2* 6.9* 6.8*   Liver Function Tests: No results found for this basename: AST, ALT, ALKPHOS, BILITOT, PROT, ALBUMIN,  in the last 168 hours CBC:  Recent Labs Lab 06/15/13 0359 06/16/13 0610 06/17/13 0653 06/18/13 0630 06/18/13 1025  WBC 16.1* 14.2* 13.8* 15.6* 14.5*  HGB 7.1* 7.9* 7.4* 6.7* 7.3*  HCT 22.2* 23.3* 22.3* 19.8* 21.8*  MCV 91.4 89.3 89.9 90.0 88.3  PLT 389 310 313 330 274   Cardiac Enzymes: No results found for this basename: CKTOTAL, CKMB, CKMBINDEX, TROPONINI,  in the last 168 hours CBG:  Recent Labs Lab 06/17/13 1135 06/17/13 1651 06/17/13 2212 06/18/13 0805 06/18/13 1349  GLUCAP 88 103* 144* 88 91    Recent Results (from the past  240 hour(s))  CULTURE, BLOOD (ROUTINE X 2)     Status: None   Collection Time    06/10/13  6:29 AM      Result Value Ref Range Status   Specimen Description BLOOD   Final   Special Requests     Final   Value: BOTTLES DRAWN AEROBIC ONLY LEFT INTERNAL JUGULAR TLC DISTAL PORT  10CC   Culture  Setup Time     Final   Value: 06/10/2013 14:40     Performed at [place]     Final   Value: NO GROWTH 5 DAYS     Performed at [facility]   Report Status 06/15/2013 FINAL   Final  CLOSTRIDIUM DIFFICILE BY PCR     Status: None   Collection Time    06/17/13  1:09 PM      Result Value Ref Range Status   C difficile by pcr NEGATIVE  NEGATIVE Final     Studies:  Recent x-ray studies have been reviewed in detail by the Attending Physician  Scheduled Meds:  Scheduled Meds: . darbepoetin (ARANESP) injection - DIALYSIS  150 mcg Intravenous Q Fri-HD  . docusate sodium  100 mg Oral BID  . feeding supplement ([place])  237 mL Oral TID BM  . ferric gluconate (FERRLECIT/NULECIT) IV  125 mg Intravenous Q M,W,F-HD  . gabapentin  200 mg Oral TID  . insulin aspart  0-20 Units Subcutaneous TID WC  . insulin glargine  15 Units Subcutaneous QHS  . labetalol  200 mg Oral BID  . multivitamin  1 tablet Oral QHS  . piperacillin-tazobactam (ZOSYN)  IV  2.25 g Intravenous Q8H  . polyethylene glycol  17 g Oral Daily  . sevelamer carbonate  800 mg Oral TID WC  . sodium chloride  10-40 mL Intracatheter Q12H  . warfarin  5 mg Oral ONCE-1800  . warfarin   Does not apply Once  . Warfarin - Pharmacist Dosing Inpatient   Does not apply q1800    Time spent on care of this patient: 25 mins  Modena Jansky, MD, FACP, [facility]. [facility] Pager [phone]  If 7PM-7AM, please contact night-coverage [email or web address] Password [email or web address] 06/18/2013, 3:07 PM   06/18/2013, 3:07 PM   LOS: 16 days

## 2013-06-18 NOTE — Progress Notes (Signed)
I spoke with pt's friend and POA , Ms. Posey. She states pt's brother, nor can she assist 24/7 after d/c for this pt which he will need. She is requesting SNF. I will alert SW.The CM for [employer] [place] is Hassan Rowan at [phone]. We will sign off. [phone]

## 2013-06-18 NOTE — Progress Notes (Signed)
Met briefly with pt at bedside in hemodialysis. I have left a message for pt's brother to contact me to discuss what assistance he may provide pt at d/c. He will need 24/7 supervision. I await OT eval so that I can seek insurance authorization also. I will follow up tomorrow. [phone]

## 2013-06-18 NOTE — Progress Notes (Signed)
Physical Therapy Treatment Patient Details Name: Steven Logan MRN: [id number] DOB: 07-10-1970 Today's Date: 06/18/2013    History of Present Illness Pt is a 43 y/o male admitted with gas-containing abscess and cellulitis in RLE. Pt is currently NWB and another I&D is planned for 06/06/13. On 06/12/2013 pt to OR for R transtibial amputation.     PT Comments    Pt progressing, tolerating standing better but still requiring Clarise Cruz plus for safe out of bed. PT will continue to follow.  Follow Up Recommendations  CIR     Equipment Recommendations  Rolling walker with 5" wheels;3in1 (PT);Wheelchair (measurements PT);Wheelchair cushion (measurements PT)    Recommendations for Other Services Rehab consult     Precautions / Restrictions Precautions Precautions: Fall Restrictions Weight Bearing Restrictions: Yes RLE Weight Bearing: Non weight bearing    Mobility  Bed Mobility Overal bed mobility: Needs Assistance Bed Mobility: Rolling;Sidelying to Sit Rolling: Min assist Sidelying to sit: Mod assist       General bed mobility comments: vc's to begin sequencing but then pt able to roll and initiate sit. Mod A to push away from bed and achieve upright sitting  Transfers Overall transfer level: Needs assistance   Transfers: Sit to/from Stand;Stand Pivot Transfers Sit to Stand: Max assist;+2 safety/equipment Stand pivot transfers: Max assist;+2 safety/equipment       General transfer comment: pt stood from bed 2x with Clarise Cruz plus. Able to get both legs into troughs today and able to dpress through shoulders during sit to stand instead of shrugging. Also able to extend through left hip to achieve upright posture. Second time, maintained standing 3 min for bowel clean up. Very fatigued after and required assist to control descent to sitting.   Ambulation/Gait             General Gait Details: unable   Stairs            Wheelchair Mobility    Modified Rankin  (Stroke Patients Only)       Balance Overall balance assessment: Needs assistance Sitting-balance support: Single extremity supported Sitting balance-Leahy Scale: Poor Sitting balance - Comments: maintained sitting EOB x 10 mis   Standing balance support: Bilateral upper extremity supported;During functional activity Standing balance-Leahy Scale: Zero                      Cognition Arousal/Alertness: Awake/alert Behavior During Therapy: WFL for tasks assessed/performed Overall Cognitive Status: Within Functional Limits for tasks assessed                 General Comments: mental status much improved    Exercises General Exercises - Lower Extremity Ankle Circles/Pumps: Left;AROM;5 reps Quad Sets: Both;Strengthening;20 reps;Seated Short Arc Quad: AROM;Right;10 reps;Supine Long Arc Quad: AROM;Right;10 reps;Seated Straight Leg Raises: AAROM;Right;10 reps;Seated    General Comments General comments (skin integrity, edema, etc.): education given on positioning for maintaining full knee ROM      Pertinent Vitals/Pain 8/10 RLE pain, RN notified    Home Living                      Prior Function            PT Goals (current goals can now be found in the care plan section) Acute Rehab PT Goals Patient Stated Goal: To walk again PT Goal Formulation: With patient Time For Goal Achievement: 06/20/13 Potential to Achieve Goals: Good Progress towards PT goals: Progressing toward goals    Frequency  Min 4X/week    PT Plan Current plan remains appropriate    Co-evaluation             End of Session Equipment Utilized During Treatment: Gait belt Activity Tolerance: Patient tolerated treatment well Patient left: in chair;with call bell/phone within reach     Time: 1435-1515 PT Time Calculation (min): 40 min  Charges:  $Therapeutic Activity: 23-37 mins                    G Codes:     Leighton Roach, PT  Acute Rehab Services   [facility] 06/18/2013, 3:27 PM

## 2013-06-18 NOTE — Procedures (Signed)
Patient was seen on dialysis and the procedure was supervised. BFR 400 Via L AVF BP is 164/75.  Patient appears to be tolerating treatment well.  On coumadin for RUE DVT, s/p RBKA due to MSSA necrotizing fasciitis in R calf with heel osteo on IV zosyn and awaiting CIR placement.

## 2013-06-19 LAB — TYPE AND SCREEN
ABO/RH(D): A POS
ANTIBODY SCREEN: NEGATIVE
UNIT DIVISION: 0
UNIT DIVISION: 0

## 2013-06-19 LAB — BASIC METABOLIC PANEL
BUN: 23 mg/dL (ref 6–23)
CHLORIDE: 97 meq/L (ref 96–112)
CO2: 28 meq/L (ref 19–32)
CREATININE: 5.49 mg/dL — AB (ref 0.50–1.35)
Calcium: 7.4 mg/dL — ABNORMAL LOW (ref 8.4–10.5)
GFR calc Af Amer: 13 mL/min — ABNORMAL LOW (ref >90)
GFR calc non Af Amer: 12 mL/min — ABNORMAL LOW (ref >90)
Glucose, Bld: 55 mg/dL — ABNORMAL LOW (ref 70–99)
Potassium: 3.9 mEq/L (ref 3.7–5.3)
SODIUM: 136 meq/L — AB (ref 137–147)

## 2013-06-19 LAB — CBC
HCT: 25.3 % — ABNORMAL LOW (ref 39.0–52.0)
HEMOGLOBIN: 8.2 g/dL — AB (ref 13.0–17.0)
MCH: 29.2 pg (ref 26.0–34.0)
MCHC: 32.4 g/dL (ref 30.0–36.0)
MCV: 90 fL (ref 78.0–100.0)
Platelets: 330 10*3/uL (ref 150–400)
RBC: 2.81 MIL/uL — ABNORMAL LOW (ref 4.22–5.81)
RDW: 15.2 % (ref 11.5–15.5)
WBC: 15.5 10*3/uL — AB (ref 4.0–10.5)

## 2013-06-19 LAB — GLUCOSE, CAPILLARY
GLUCOSE-CAPILLARY: 72 mg/dL (ref 70–99)
GLUCOSE-CAPILLARY: 72 mg/dL (ref 70–99)
Glucose-Capillary: 153 mg/dL — ABNORMAL HIGH (ref 70–99)

## 2013-06-19 LAB — PROTIME-INR
INR: 2.4 — AB (ref 0.00–1.49)
Prothrombin Time: 25.4 seconds — ABNORMAL HIGH (ref 11.6–15.2)

## 2013-06-19 LAB — HEPARIN LEVEL (UNFRACTIONATED): HEPARIN UNFRACTIONATED: 0.37 [IU]/mL (ref 0.30–0.70)

## 2013-06-19 MED ORDER — HYDROCODONE-ACETAMINOPHEN 5-325 MG PO TABS
1.0000 | ORAL_TABLET | ORAL | Status: DC | PRN
  Administered 2013-06-19: 1 via ORAL
  Filled 2013-06-19: qty 2

## 2013-06-19 MED ORDER — WARFARIN SODIUM 1 MG PO TABS
1.0000 mg | ORAL_TABLET | Freq: Once | ORAL | Status: AC
  Administered 2013-06-19: 1 mg via ORAL
  Filled 2013-06-19: qty 1

## 2013-06-19 MED ORDER — HYDROCODONE-ACETAMINOPHEN 5-325 MG PO TABS: 1.0000 | ORAL_TABLET | Freq: Four times a day (QID) | ORAL | Status: DC | PRN

## 2013-06-19 MED ORDER — GABAPENTIN 100 MG PO CAPS
100.0000 mg | ORAL_CAPSULE | Freq: Three times a day (TID) | ORAL | Status: DC
  Administered 2013-06-19 – 2013-07-02 (×37): 100 mg via ORAL
  Filled 2013-06-19 (×43): qty 1

## 2013-06-19 MED ORDER — METHOCARBAMOL 500 MG PO TABS
500.0000 mg | ORAL_TABLET | Freq: Three times a day (TID) | ORAL | Status: DC | PRN
  Administered 2013-06-19 – 2013-06-21 (×4): 500 mg via ORAL
  Filled 2013-06-19 (×5): qty 1

## 2013-06-19 MED ORDER — METHOCARBAMOL 1000 MG/10ML IJ SOLN: 500.0000 mg | Freq: Three times a day (TID) | INTRAVENOUS | Status: DC | PRN

## 2013-06-19 NOTE — Progress Notes (Signed)
Patient ID: Steven Logan, male   DOB: 01-18-1971, 43 y.o.   MRN: [id number] Dressing changed this morning for right transtibial amputation. The blister has resolved with healthy granulation tissue. The surgical incision is clean and dry no necrotic changes there is no fluctuance no drainage no signs of infection.  Patient is still having intermittent fevers with elevated white cell count. I am unsure the source of the fevers. I feel that it would be appropriate to discontinue the antibiotics from the standpoint of the amputation.

## 2013-06-19 NOTE — Evaluation (Signed)
Occupational Therapy Evaluation Patient Details Name: Steven Logan MRN: [id number] DOB: 06-18-1970 Today's Date: 06/19/2013    History of Present Illness Pt admitted 06/02/13 with sepsis and R LE abscess and cellulitis.  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  Pt with ESRD and DM.   Clinical Impression   Pt presents with generalized weakness, pain in R shoulder with FF >90*, R LE pain, slow processing speed, and decreased balance interfering with ability to perform ADL.  Pt will need SNF level post acute rehab.  Will follow acutely.   Follow Up Recommendations  SNF    Equipment Recommendations       Recommendations for Other Services       Precautions / Restrictions Precautions Precautions: Fall Restrictions Weight Bearing Restrictions: No RLE Weight Bearing: Non weight bearing Other Position/Activity Restrictions: s/p R BKA      Mobility Bed Mobility Overal bed mobility: Needs Assistance Bed Mobility: Rolling;Sidelying to Sit;Sit to Sidelying Rolling: Min assist Sidelying to sit: Mod assist     Sit to sidelying: Mod assist General bed mobility comments: pt able to bring legs back up onto bed independently, needs cues, but able to use left leg to assist with mobility  Transfers                      Balance Overall balance assessment: Needs assistance Sitting-balance support: Feet supported;Single extremity supported;Bilateral upper extremity supported Sitting balance-Leahy Scale: Poor Sitting balance - Comments: pt reluctant to sit up with OT as he had sat earlier with PT and was fatigued.                                    ADL Overall ADL's : Needs assistance/impaired Eating/Feeding: Bed level;Independent   Grooming: Wash/dry hands;Wash/dry face;Bed level;Set up   Upper Body Bathing: Moderate assistance;Bed level   Lower Body Bathing: Total assistance;Bed level   Upper Body Dressing : Minimal  assistance;Bed level   Lower Body Dressing: Total assistance;Bed level                 General ADL Comments: Pt with fatigue limiting session today.  Sitting balance requires use of B UEs to prop, so unable to complete ADL without support.  Inconsistency with regard to desire for fitted sheet and repositioning and somewhat delayed response time.  Pt positioned on R side with pillow behind him for pressure relief. Instructed pt in importance of pressure relief.     Vision                     Perception     Praxis      Pertinent Vitals/Pain R LE, R shoulder, repositioned, premedicated     Hand Dominance Right   Extremity/Trunk Assessment Upper Extremity Assessment Upper Extremity Assessment: RUE deficits/detail;LUE deficits/detail RUE Deficits / Details: 4-/5 shoulder, elbow extension, gross grasp, 3+/5 elbow extension (Pt with clavicular pain with shoulder flexion >90.) LUE Deficits / Details: 3+/5 shoulder, 4-/5 elbow flexion/extension, gross grasp   Lower Extremity Assessment Lower Extremity Assessment: Defer to PT evaluation       Communication Communication Communication: No difficulties   Cognition Arousal/Alertness: Lethargic Behavior During Therapy: Flat affect Overall Cognitive Status: Within Functional Limits for tasks assessed                 General Comments: somewhat slow processing/responses to  questions   General Comments       Exercises       Shoulder Instructions      Home Living Family/patient expects to be discharged to:: Skilled nursing facility Living Arrangements: Alone Available Help at Discharge: Family;Available PRN/intermittently                                    Prior Functioning/Environment          Comments: States he was independent until 2 weeks prior to admission.    OT Diagnosis: Generalized weakness;Acute pain;Cognitive deficits   OT Problem List: Decreased strength;Decreased activity  tolerance;Impaired balance (sitting and/or standing);Decreased knowledge of use of DME or AE;Impaired UE functional use;Pain   OT Treatment/Interventions: Self-care/ADL training;Therapeutic exercise;DME and/or AE instruction;Therapeutic activities;Patient/family education;Balance training    OT Goals(Current goals can be found in the care plan section) Acute Rehab OT Goals Patient Stated Goal: To walk again OT Goal Formulation: With patient Time For Goal Achievement: 07/03/13 Potential to Achieve Goals: Good  OT Frequency: Min 2X/week   Barriers to D/C: Decreased caregiver support          Co-evaluation              End of Session    Activity Tolerance: Patient limited by fatigue Patient left: in bed;with call bell/phone within reach;with nursing/sitter in room   Time: 1120-1145 OT Time Calculation (min): 25 min Charges:  OT General Charges $OT Visit: 1 Procedure OT Evaluation $Initial OT Evaluation Tier I: 1 Procedure OT Treatments $Self Care/Home Management : 8-22 mins G-Codes:    Haze Boyden Dimitry Holsworth 07/06/2013, 12:12 PM [phone]

## 2013-06-19 NOTE — Progress Notes (Signed)
Subjective:  Sleeping somewhat hard to arouse but oriented to mch, Tues. No co,  Objective Vital signs in last 24 hours: Filed Vitals:   06/18/13 1703 06/18/13 2200 06/18/13 2344 06/19/13 0518  BP: 186/76 168/70  182/61  Pulse: 96 100  91  Temp: 100.3 F (37.9 C) 100.6 F (38.1 C) 99.6 F (37.6 C) 98.6 F (37 C)  TempSrc: Oral Oral Oral Oral  Resp: 18 16  18   Height:      Weight:      SpO2: 95% 99%  100%   Weight change: 3.523 kg (7 lb 12.3 oz) Physical Exam  General: lethargic,aroused with voice and sternal rub ,NAD 02 sat okay Heart: RRR  Lungs: CTA. unlabored  Abdomen:obese, soft, nontender +BS  Extremities: R BKA wrapped   Access: L AVF patent on HD  HD: [facility]  4.5h 111.5kg 2/2.5 Bath 500/800 Heparin 11000 LFA AVF  Calcitriol 1.25ug EPO 8000 Venofer s/p load completed 5/11   Assessment:  1. R BKA 5/19 Dr Sharol Given (MSSA necrotizing fasciitis R calf / heel osteo / septic ankle s/p I&D)-Fever last pm  on IV zosyn / Dr. Sharol Given rx 2. AMS= fever/ vs pain med= dilaudid last night / taper back pain meds 3 ESRD on HD resuming low dose hep 2500 x 2 prn  4. RUE DVT- heparin gtt per pharm, coumadin started yesterday  5. Anemia - hgb 7.1>8.2 has had 3u prbc transfused, on aranesp 150/wk, Fe low > 1gm load in progress  6. HPTH- Ca+ 7.5. Cont calcitriol renvela  7. HTN/volume - BP's up, resumed labetalol, [id number] yesterday stable bp/ with next HD on wed challenge more / need 8. Nutrition - Clears, Nepro, mulitivit. Poor appetite  9. Dispo- for [facility] per rehab team   Ernest Haber, PA-C [facility] [phone] 06/19/2013,8:59 AM  LOS: 17 days   Labs: Basic Metabolic Panel:  Recent Labs Lab 06/17/13 0653 06/18/13 0630 06/19/13 0555  NA 134* 134* 136*  K 4.4 4.5 3.9  CL 94* 94* 97  CO2 25 24 28   GLUCOSE 75 93 55*  BUN 36* 44* 23  CREATININE 7.49* 8.98* 5.49*  CALCIUM 6.9* 6.8* 7.4*  CBC:  Recent Labs Lab 06/16/13 0610 06/17/13 0653 06/18/13 0630  06/18/13 1025 06/19/13 0555  WBC 14.2* 13.8* 15.6* 14.5* 15.5*  HGB 7.9* 7.4* 6.7* 7.3* 8.2*  HCT 23.3* 22.3* 19.8* 21.8* 25.3*  MCV 89.3 89.9 90.0 88.3 90.0  PLT 310 313 330 274 330  : CBG:  Recent Labs Lab 06/17/13 2212 06/18/13 0805 06/18/13 1349 06/18/13 1655 06/19/13 0759  GLUCAP 144* 88 91 119* 153*    Studies/Results: Dg Chest Port 1 View  06/18/2013   CLINICAL DATA:  Fever.  EXAM: PORTABLE CHEST - 1 VIEW  COMPARISON:  06/06/2013 and 05/29/2013 and chest CT dated 05/29/2013  FINDINGS: The loculated right pleural effusion has diminished. There are no new infiltrates or effusions. PICC tip is in the superior vena cava at the level of the azygos vein. All Heart size and vascularity are normal.  IMPRESSION: No acute abnormalities. Almost complete resolution of the loculated effusion at the right lung base.  Critical Value/emergent results were called by telephone at the time of interpretation on 06/18/2013 at 8:25 AM to [place], RN, who verbally acknowledged these results.   Electronically Signed   By: Rozetta Nunnery M.D.   On: 06/18/2013 08:25   Medications: . sodium chloride 10 mL/hr (06/18/13 1704)  . heparin 2,450 Units/hr (06/19/13 0425)   .  darbepoetin (ARANESP) injection - DIALYSIS  150 mcg Intravenous Q Fri-HD  . docusate sodium  100 mg Oral BID  . feeding supplement (GLUCERNA SHAKE)  237 mL Oral TID BM  . ferric gluconate (FERRLECIT/NULECIT) IV  125 mg Intravenous Q M,W,F-HD  . gabapentin  200 mg Oral TID  . insulin aspart  0-20 Units Subcutaneous TID WC  . insulin glargine  15 Units Subcutaneous QHS  . labetalol  200 mg Oral BID  . multivitamin  1 tablet Oral QHS  . piperacillin-tazobactam (ZOSYN)  IV  2.25 g Intravenous Q8H  . polyethylene glycol  17 g Oral Daily  . sevelamer carbonate  800 mg Oral TID WC  . sodium chloride  10-40 mL Intracatheter Q12H  . warfarin   Does not apply Once  . Warfarin - Pharmacist Dosing Inpatient   Does not apply [phone]

## 2013-06-19 NOTE — Progress Notes (Signed)
I have seen and examined this patient and agree with plan as outlined by D. Zeyfang, PA-C. [place] John A Remmie Bembenek,MD 06/19/2013 11:42 AM

## 2013-06-19 NOTE — Progress Notes (Signed)
Physical Therapy Treatment Patient Details Name: Steven Logan MRN: [id number] DOB: Sep 22, 1970 Today's Date: 06/19/2013    History of Present Illness pt s/p transtibial amputation 06/13/2023 with slow post op progress. Mr. Slavich said today that he is "scared" because so much has happened so fast and he is trying to cope . He reports she has a sore hard area a right infraclavicular area    PT Comments    Good effort with PT. Anticipate he may need prolonged rehab to return to functional independence.   Follow Up Recommendations  SNF     Equipment Recommendations  Rolling walker with 5" wheels;3in1 (PT);Wheelchair (measurements PT);Wheelchair cushion (measurements PT)    Recommendations for Other Services       Precautions / Restrictions Precautions Precautions: Fall Restrictions Weight Bearing Restrictions: Yes RLE Weight Bearing: Non weight bearing    Mobility  Bed Mobility Overal bed mobility: Needs Assistance Bed Mobility: Rolling;Sidelying to Sit;Sit to Sidelying Rolling: Min assist Sidelying to sit: Mod assist     Sit to sidelying: Mod assist General bed mobility comments: pt able to bring legs back up onto bed independently, needs cues, but able to use left leg to assist with mobility  Transfers                    Ambulation/Gait                 Stairs            Wheelchair Mobility    Modified Rankin (Stroke Patients Only)       Balance Overall balance assessment: Needs assistance Sitting-balance support: Feet supported;Single extremity supported;Bilateral upper extremity supported   Sitting balance - Comments: maintained sitting EOB x 10 min and was able to move upper trunk in various directions and maintain balance.  He did long arc quad activity without balance loss. Pt fatgued at end of  session                             Cognition Arousal/Alertness: Awake/alert Behavior During Therapy: WFL for tasks  assessed/performed Overall Cognitive Status: Within Functional Limits for tasks assessed                 General Comments: Some slow responses, but good effort throughout session    Exercises General Exercises - Lower Extremity Ankle Circles/Pumps: Left;AROM;5 reps Quad Sets: Both;Strengthening;20 reps;Supine Long Arc Quad: AROM;10 reps;Left;Seated Hip ABduction/ADduction: Both;10 reps;Sidelying Straight Leg Raises: 10 reps;AROM;Both;Supine Hip Flexion/Marching: AROM;Left;10 reps;Supine    General Comments        Pertinent Vitals/Pain Pain in right LE with mobility, Soreness at hard area on right chest    Home Living                      Prior Function            PT Goals (current goals can now be found in the care plan section) Progress towards PT goals: Progressing toward goals    Frequency  Min 4X/week    PT Plan Discharge plan needs to be updated    Co-evaluation             End of Session   Activity Tolerance: Patient tolerated treatment well;Patient limited by fatigue Patient left: in bed;with call bell/phone within reach;with nursing/sitter in room     Time: [id number] PT Time Calculation (min): 40 min  Charges:  $Therapeutic  Exercise: 23-37 mins $Therapeutic Activity: 8-22 mins                    G Codes:     Donesha Wallander K. Owens Shark, [place] 06/19/2013, 10:36 AM

## 2013-06-19 NOTE — Progress Notes (Signed)
ANTICOAGULATION CONSULT NOTE - Follow Up Consult  Pharmacy Consult  :  Coumadin with Heparin bridging  Indication  :  Acute non-occlusive RUE DVT, Hx PE  Heparin Dosing Weight: 99 kg   Recent Labs  06/16/13 1800 06/17/13 0653 06/18/13 0630 06/18/13 1025 06/19/13 0555  HGB  --  7.4* 6.7* 7.3* 8.2*  HCT  --  22.3* 19.8* 21.8* 25.3*  PLT  --  313 330 274 330  LABPROT  --  15.9* 20.5*  --  25.4*  INR  --  1.30 1.82*  --  2.40*  HEPARINUNFRC 0.30 0.36 0.38  --  0.37  CREATININE  --  7.49* 8.98*  --  5.49*     Medications: Scheduled:  . darbepoetin (ARANESP) injection - DIALYSIS  150 mcg Intravenous Q Fri-HD  . docusate sodium  100 mg Oral BID  . feeding supplement (GLUCERNA SHAKE)  237 mL Oral TID BM  . ferric gluconate (FERRLECIT/NULECIT) IV  125 mg Intravenous Q M,W,F-HD  . gabapentin  100 mg Oral TID  . insulin aspart  0-20 Units Subcutaneous TID WC  . insulin glargine  15 Units Subcutaneous QHS  . labetalol  200 mg Oral BID  . multivitamin  1 tablet Oral QHS  . piperacillin-tazobactam (ZOSYN)  IV  2.25 g Intravenous Q8H  . polyethylene glycol  17 g Oral Daily  . sevelamer carbonate  800 mg Oral TID WC  . sodium chloride  10-40 mL Intracatheter Q12H  . warfarin   Does not apply Once  . Warfarin - Pharmacist Dosing Inpatient   Does not apply q1800    Infusions:  . sodium chloride 10 mL/hr (06/18/13 1704)  . heparin 2,450 Units/hr (06/19/13 0425)    Assessment:  Day # 5 of 5 of Coumadin with Heparin bridging for RUE DVT and hx of PE.  Heparin rate 2450 units/hr.  Heparin level 0.37.  Large bump in INR overnight from 1.82 to 2.4 after 4 doses of Coumadin. No evidence of bleeding complications noted   Goal:  Heparin Level  0.3 - 0.7 units/ml  INR 2-3   Plan: 1. Continue Heparin infusion at same rate for bridging for 5 days overlap plus and additional  24 - 48 hours with sustained therapeutic INR.  If INR > 2 in AM, may consider discontinuing Heparin at that  time. 2. Coumadin 1 mg po today.    Pihu Basil, Craig Guess,  Pharm.D  06/19/2013, 10:04 AM

## 2013-06-19 NOTE — Progress Notes (Signed)
Patient ID: Steven Logan, male   DOB: 30-May-1970, 43 y.o.   MRN: [id number] [facility] PROGRESS NOTE  Steven Logan [id number] DOB: [birth year] DOA: 06/02/2013 PCP: Tivis Ringer, MD  Brief narrative: 43 year old male with past medical history of morbid obesity, gout, HTN, DLD, PE, Pancreatitis, DM who presented to [facility] ED 06/02/2013 with worsening right ankle swelling for 2 weeks prior to this admission. Patient was started on prednisone for gout treatment initially which did not help. His wound opened up and started to have drainage. Patient subsequently re-presented to the ER. An MRI of the ankle revealed gas-containing abscess and cellulitis. Orthopedics took him emergently to the OR for debridement. He is status post right lower extremity transtibial amputation and remains on IV Zosyn. He was diagnosed with acute nonocclusive DVT right upper extremity and is on heparin bridging and Coumadin.   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 Per ortho pt to continue abx for now  Requires rehab on discharge   Pt does spike intermittent fevers which are likely due to this sepsis, ankle abscess. CXR did not reveal acute abnormalities. May need ID if fevers persist.  Active Problems: Acute nonocclusive DVT RUE   jugular, subclavian, axillary, and brachial veins involved   Has had recent removal of HD cath from right IJ 5/10 (which has been in place since Aug 2014)   On coumadin and heparin for anticoagulation; INR monitored by pharmacy as well Anemia in chronic kidney disease / acute postoperative blood loss anemia   Has received 2 units PRBC blood transfusion 06/18/2013 and Hgb now 8.2 Encephalopathy, toxic   Mental status waxing and  waining -worse on HD days  End stage renal disease on HD M/W/F   Per renal management  Hypertension   Stable, BP 155/83 DM / Hyperglycemia   A1c 7.0; good glycemic control  Obesity - morbid  Body mass index is 28.69 HPTH  Continue Calcitrol and renvela   Code Status: full code Family Communication: no family at the bedside  Disposition Plan: home when stable   Consultants:   Ortho   Nephro   Procedures:   I and D on 06/03/2013   IRRIGATION AND DEBRIDEMENT EXTREMITY extensive excision of muscle fascia and soft tissue right leg 06/06/13   Transtibial amputation 5/19   Antibiotics:   Vanc 5/9 >5/17   Zosyn 5/9 >    DVT prophylaxis:  IV heparin and Coumadin  Robbie Lis, MD  [facility] Pager [phone]  If 7PM-7AM, please contact night-coverage [email or web address] Password [email or web address] 06/19/2013, 9:26 PM   LOS: 17 days    HPI/Subjective: No overnight events.   Objective: Filed Vitals:   06/19/13 0518 06/19/13 0904 06/19/13 1140 06/19/13 1715  BP: 182/61 178/67  155/83  Pulse: 91 99  94  Temp: 98.6 F (37 C) 99.9 F (37.7 C) 99.2 F (37.3 C) 100.1 F (37.8 C)  TempSrc: Oral Oral Oral Oral  Resp: 18 14  16   Height:      Weight:      SpO2: 100% 95%  100%    Intake/Output Summary (Last 24 hours) at 06/19/13 2126 Last data filed at 06/19/13 1415  Gross per 24 hour  Intake     75 ml  Output  0 ml  Net     75 ml    Exam:   General:  Pt is alert, not in acute distress  Cardiovascular: Regular rate and rhythm, S1/S2 appreciated   Respiratory: Clear to auscultation bilaterally, no wheezing  Abdomen: Soft, non tender, non distended, bowel sounds present  Extremities: right BKA, pulses DP and PT palpable bilaterally; Left AV fistula   Neuro: Grossly nonfocal  Data Reviewed: Basic Metabolic Panel:  Recent Labs Lab 06/15/13 0359 06/16/13 0610 06/17/13 0653 06/18/13 0630 06/19/13 0555  NA 136* 135* 134* 134* 136*  K 4.7 3.8 4.4  4.5 3.9  CL 95* 93* 94* 94* 97  CO2 24 27 25 24 28   GLUCOSE 73 161* 75 93 55*  BUN 44* 26* 36* 44* 23  CREATININE 8.24* 5.55* 7.49* 8.98* 5.49*  CALCIUM 7.3* 7.2* 6.9* 6.8* 7.4*   Liver Function Tests: No results found for this basename: AST, ALT, ALKPHOS, BILITOT, PROT, ALBUMIN,  in the last 168 hours No results found for this basename: LIPASE, AMYLASE,  in the last 168 hours No results found for this basename: AMMONIA,  in the last 168 hours CBC:  Recent Labs Lab 06/16/13 0610 06/17/13 0653 06/18/13 0630 06/18/13 1025 06/19/13 0555  WBC 14.2* 13.8* 15.6* 14.5* 15.5*  HGB 7.9* 7.4* 6.7* 7.3* 8.2*  HCT 23.3* 22.3* 19.8* 21.8* 25.3*  MCV 89.3 89.9 90.0 88.3 90.0  PLT 310 313 330 274 330   Cardiac Enzymes: No results found for this basename: CKTOTAL, CKMB, CKMBINDEX, TROPONINI,  in the last 168 hours BNP: No components found with this basename: POCBNP,  CBG:  Recent Labs Lab 06/18/13 1349 06/18/13 1655 06/19/13 0759 06/19/13 1137 06/19/13 1601  GLUCAP 91 119* 153* 72 72    Recent Results (from the past 240 hour(s))  CULTURE, BLOOD (ROUTINE X 2)     Status: None   Collection Time    06/10/13  6:29 AM      Result Value Ref Range Status   Specimen Description BLOOD   Final   Special Requests     Final   Value: BOTTLES DRAWN AEROBIC ONLY LEFT INTERNAL JUGULAR TLC DISTAL PORT  10CC   Culture  Setup Time     Final   Value: 06/10/2013 14:40     Performed at [facility]     Final   Value: NO GROWTH 5 DAYS     Performed at [facility]   Report Status 06/15/2013 FINAL   Final  CLOSTRIDIUM DIFFICILE BY PCR     Status: None   Collection Time    06/17/13  1:09 PM      Result Value Ref Range Status   C difficile by pcr NEGATIVE  NEGATIVE Final  CULTURE, BLOOD (ROUTINE X 2)     Status: None   Collection Time    06/18/13  9:15 AM      Result Value Ref Range Status   Specimen Description BLOOD HEMODIALYSIS FISTULA   Final   Special Requests  BOTTLES DRAWN AEROBIC AND ANAEROBIC 10CC   Final   Culture  Setup Time     Final   Value: 06/18/2013 16:16     Performed at [facility]   Culture     Final   Value:        BLOOD CULTURE RECEIVED NO GROWTH TO DATE CULTURE WILL BE HELD FOR 5 DAYS BEFORE ISSUING A FINAL NEGATIVE REPORT     Performed at [facility]   Report Status PENDING   Incomplete  CULTURE, BLOOD (ROUTINE X 2)     Status: None   Collection Time    06/18/13  9:35 AM      Result Value Ref Range Status   Specimen Description BLOOD HEMODIALYSIS FISTULA   Final   Special Requests BOTTLES DRAWN AEROBIC AND ANAEROBIC 10CC   Final   Culture  Setup Time     Final   Value: 06/18/2013 16:17     Performed at [facility]   Culture     Final   Value:        BLOOD CULTURE RECEIVED NO GROWTH TO DATE CULTURE WILL BE HELD FOR 5 DAYS BEFORE ISSUING A FINAL NEGATIVE REPORT     Performed at [facility]   Report Status PENDING   Incomplete     Studies: Dg Chest Port 1 View  06/18/2013   CLINICAL DATA:  Fever.  EXAM: PORTABLE CHEST - 1 VIEW  COMPARISON:  06/06/2013 and 05/29/2013 and chest CT dated 05/29/2013  FINDINGS: The loculated right pleural effusion has diminished. There are no new infiltrates or effusions. PICC tip is in the superior vena cava at the level of the azygos vein. All Heart size and vascularity are normal.  IMPRESSION: No acute abnormalities. Almost complete resolution of the loculated effusion at the right lung base.  Critical Value/emergent results were called by telephone at the time of interpretation on 06/18/2013 at 8:25 AM to [place], RN, who verbally acknowledged these results.   Electronically Signed   By: Rozetta Nunnery M.D.   On: 06/18/2013 08:25    Scheduled Meds: . darbepoetin (ARANESP) injection - DIALYSIS  150 mcg Intravenous Q Fri-HD  . docusate sodium  100 mg Oral BID  . feeding supplement (GLUCERNA SHAKE)  237 mL Oral TID BM  . ferric gluconate (FERRLECIT/NULECIT) IV  125  mg Intravenous Q M,W,F-HD  . gabapentin  100 mg Oral TID  . insulin aspart  0-20 Units Subcutaneous TID WC  . insulin glargine  15 Units Subcutaneous QHS  . labetalol  200 mg Oral BID  . multivitamin  1 tablet Oral QHS  . piperacillin-tazobactam (ZOSYN)  IV  2.25 g Intravenous Q8H  . polyethylene glycol  17 g Oral Daily  . sevelamer carbonate  800 mg Oral TID WC  . sodium chloride  10-40 mL Intracatheter Q12H  . warfarin   Does not apply Once  . Warfarin - Pharmacist Dosing Inpatient   Does not apply q1800   Continuous Infusions: . sodium chloride 10 mL/hr (06/18/13 1704)  . heparin 2,450 Units/hr (06/19/13 1550)

## 2013-06-20 ENCOUNTER — Inpatient Hospital Stay (HOSPITAL_COMMUNITY): Payer: BC Managed Care – PPO

## 2013-06-20 DIAGNOSIS — I82A19 Acute embolism and thrombosis of unspecified axillary vein: Secondary | ICD-10-CM

## 2013-06-20 DIAGNOSIS — I82B19 Acute embolism and thrombosis of unspecified subclavian vein: Secondary | ICD-10-CM

## 2013-06-20 DIAGNOSIS — D638 Anemia in other chronic diseases classified elsewhere: Secondary | ICD-10-CM

## 2013-06-20 DIAGNOSIS — R911 Solitary pulmonary nodule: Secondary | ICD-10-CM

## 2013-06-20 DIAGNOSIS — D62 Acute posthemorrhagic anemia: Secondary | ICD-10-CM

## 2013-06-20 DIAGNOSIS — I82C19 Acute embolism and thrombosis of unspecified internal jugular vein: Secondary | ICD-10-CM

## 2013-06-20 LAB — CBC
HCT: 22.6 % — ABNORMAL LOW (ref 39.0–52.0)
Hemoglobin: 7.3 g/dL — ABNORMAL LOW (ref 13.0–17.0)
MCH: 29 pg (ref 26.0–34.0)
MCHC: 32.3 g/dL (ref 30.0–36.0)
MCV: 89.7 fL (ref 78.0–100.0)
Platelets: 308 10*3/uL (ref 150–400)
RBC: 2.52 MIL/uL — ABNORMAL LOW (ref 4.22–5.81)
RDW: 14.7 % (ref 11.5–15.5)
WBC: 14.4 10*3/uL — AB (ref 4.0–10.5)

## 2013-06-20 LAB — GLUCOSE, CAPILLARY
GLUCOSE-CAPILLARY: 113 mg/dL — AB (ref 70–99)
Glucose-Capillary: 105 mg/dL — ABNORMAL HIGH (ref 70–99)
Glucose-Capillary: 71 mg/dL (ref 70–99)
Glucose-Capillary: 145 mg/dL — ABNORMAL HIGH (ref 70–99)

## 2013-06-20 LAB — BASIC METABOLIC PANEL
BUN: 34 mg/dL — ABNORMAL HIGH (ref 6–23)
CALCIUM: 7 mg/dL — AB (ref 8.4–10.5)
CO2: 25 mEq/L (ref 19–32)
CREATININE: 7.11 mg/dL — AB (ref 0.50–1.35)
Chloride: 96 mEq/L (ref 96–112)
GFR calc Af Amer: 10 mL/min — ABNORMAL LOW (ref >90)
GFR calc non Af Amer: 8 mL/min — ABNORMAL LOW (ref >90)
Glucose, Bld: 66 mg/dL — ABNORMAL LOW (ref 70–99)
Potassium: 4.2 mEq/L (ref 3.7–5.3)
SODIUM: 135 meq/L — AB (ref 137–147)

## 2013-06-20 LAB — HEPARIN LEVEL (UNFRACTIONATED): HEPARIN UNFRACTIONATED: 0.4 [IU]/mL (ref 0.30–0.70)

## 2013-06-20 LAB — PROTIME-INR
INR: 2.77 — AB (ref 0.00–1.49)
PROTHROMBIN TIME: 28.3 s — AB (ref 11.6–15.2)

## 2013-06-20 MED ORDER — DARBEPOETIN ALFA-POLYSORBATE 150 MCG/0.3ML IJ SOLN
INTRAMUSCULAR | Status: AC
  Filled 2013-06-20: qty 0.3

## 2013-06-20 MED ORDER — OXYCODONE-ACETAMINOPHEN 5-325 MG PO TABS: 1.0000 | ORAL_TABLET | ORAL | Status: DC | PRN

## 2013-06-20 MED ORDER — OXYCODONE-ACETAMINOPHEN 5-325 MG PO TABS
1.0000 | ORAL_TABLET | Freq: Four times a day (QID) | ORAL | Status: DC | PRN
  Administered 2013-06-20 – 2013-06-24 (×9): 1 via ORAL
  Filled 2013-06-20 (×9): qty 1

## 2013-06-20 MED ORDER — HEPARIN (PORCINE) IN NACL 100-0.45 UNIT/ML-% IJ SOLN
2550.0000 [IU]/h | INTRAMUSCULAR | Status: DC
  Administered 2013-06-20: 2450 [IU]/h via INTRAVENOUS
  Administered 2013-06-21: 2550 [IU]/h via INTRAVENOUS
  Filled 2013-06-20 (×4): qty 250

## 2013-06-20 MED ORDER — WARFARIN SODIUM 1 MG PO TABS
1.0000 mg | ORAL_TABLET | Freq: Once | ORAL | Status: DC
  Filled 2013-06-20: qty 1

## 2013-06-20 MED ORDER — OXYCODONE-ACETAMINOPHEN 5-325 MG PO TABS
ORAL_TABLET | ORAL | Status: AC
  Administered 2013-06-20: 2 via ORAL
  Filled 2013-06-20: qty 1

## 2013-06-20 MED ORDER — OXYCODONE-ACETAMINOPHEN 5-325 MG PO TABS
2.0000 | ORAL_TABLET | Freq: Once | ORAL | Status: AC
  Administered 2013-06-20 (×2): 2 via ORAL
  Filled 2013-06-20: qty 2

## 2013-06-20 NOTE — Consult Note (Signed)
[name]  Telephone:([phone]) [place] NOTE I have seen the patient, examined him and edited the notes as follows  Steven Logan                                MR#: [id number]  DOB: Aug 23, 1970                                 CSN#: [id number]  Referring MD: [facility]  Primary MD: Dr. Alvy Bimler  Reason for Consult: Deep Venous Thrombosis  [id number] Steven Logan is a 43 y.o. male currently hospitalized for severe sepsis after hospitalization for right lower extremity amputation on [id number], complicated with MSSA necrotizing fascitis.  I am asked to see for evaluation of right upper extremity DVT. He has a very complicated hospital course requiring surgery and ICU care. He has HD catheter on his right neck. Around 5/8, he began complaining of right upper extremity swelling. He had no respiratory or cardiac complaints. Venous doppler on 5/11 showed nonocclusive DVT right upper extremity with jugular, subclavian, axillary and brachial veins involved. Prior CT on 5/5 was negative for pulmonary emboli but a 11 mm nodule was seen on the left lower lobe of unknown significance. He was placed on heparin. His hemodialysis catheter had been removed on 5/10. No hypercoagulable panel was order prior to anticoagulation. He is transitioning to Coumadin.  Patient carries a history of pulmonary embolism more than 20 years ago, on chronic coumadin. Patient cannot provide further details. He said his prior history of blood clot was not provoked. On review of electronic records, CT angiogram performed in 2008 massive bilateral pulmonary emboli present. Some of the emboli cross into both right and left main pulmonary arteries. There is almost complete occlusion of the right main pulmonary artery with partial occlusion of the left. The clots extend into both upper lobes and lower lobes, right more so than left. He was anticoagulated with warfarin for 6  months.  Currently, he has persistent right arm swelling. The patient denies any recent signs or symptoms of bleeding such as spontaneous epistaxis, hematuria or hematochezia. He denies recent chest pain on exertion, shortness of breath on minimal exertion, pre-syncopal episodes, hemoptysis, or palpitation. Prior to admission, he has his dialysis through catheter in his right side. He denies history of trauma,  long distance travel or smoking. He had no prior history or diagnosis of cancer. He had prior surgeries before and never had perioperative thromboembolic events. The patient was not on testosterone replacement therapy.  There is no family history of blood clots or miscarriages.  PMH:  Past Medical History  Diagnosis Date  . Eczema   . Morbid obesity   . Gout   . HTN (hypertension)   . Dyslipidemia   . Pulmonary embolism     3  in  lungs  at  one time...  . Abscess     great toe  . Pancreatitis   . Arthritis   . Depression   . Diabetes mellitus   . Anemia   . Pneumonia     2-3 years ago  . Shortness of breath     ?? chest  cold he has now.  10/8- no SOB  . Renal hypertension     Hemo started  Sept 2014- MWF  .  Chronic kidney disease     esrd    Surgeries:  Past Surgical History  Procedure Laterality Date  . None    . Av fistula placement Left 04/07/2012    Procedure: ARTERIOVENOUS (AV) FISTULA CREATION;  Surgeon: Angelia Mould, MD;  Location: [place];  Service: Vascular;  Laterality: Left;  . Ligation of competing branches of arteriovenous fistula Left 09/19/2012    Procedure: LIGATION OF COMPETING BRANCHES OF LEFT ARM ARTERIOVENOUS FISTULA; Vein angioplasty;  Surgeon: Angelia Mould, MD;  Location: [place];  Service: Vascular;  Laterality: Left;  . Insertion of dialysis catheter Right 09/19/2012    Procedure: INSERTION OF DIALYSIS CATHETER;  Surgeon: Angelia Mould, MD;  Location: [place];  Service: Vascular;  Laterality: Right;  . I&d extremity  Right 06/02/2013    Procedure: IRRIGATION AND DEBRIDEMENT ARTHROSCOPIC RIGHT ANKLE;  Surgeon: Augustin Schooling, MD;  Location: [place];  Service: Orthopedics;  Laterality: Right;  . I&d extremity Right 06/04/2013    Procedure: IRRIGATION AND DEBRIDEMENT RIGHT FOOT/ANKLE;  Surgeon: Newt Minion, MD;  Location: [place];  Service: Orthopedics;  Laterality: Right;  . I&d extremity Right 06/06/2013    Procedure: IRRIGATION AND DEBRIDEMENT EXTREMITY;  Surgeon: Newt Minion, MD;  Location: [place];  Service: Orthopedics;  Laterality: Right;  . Amputation Right 06/12/2013    Procedure: Transtibial Amputation;  Surgeon: Newt Minion, MD;  Location: [place];  Service: Orthopedics;  Laterality: Right;    Allergies: No Known Allergies  Medications:   Prior to Admission:  Prescriptions prior to admission  Medication Sig Dispense Refill  . labetalol (NORMODYNE) 200 MG tablet Take 200 mg by mouth 2 (two) times daily.      Marland Kitchen oxyCODONE-acetaminophen (PERCOCET/ROXICET) 5-325 MG per tablet Take 1-2 tablets by mouth every 4 (four) hours as needed for severe pain.  15 tablet  0  . sevelamer carbonate (RENVELA) 800 MG tablet Take 800 mg by mouth 3 (three) times daily with meals.        [id number] chloride, sodium chloride, acetaminophen, acetaminophen, bisacodyl, feeding supplement (NEPRO CARB STEADY), heparin, heparin, lidocaine (PF), lidocaine-prilocaine, methocarbamol, ondansetron (ZOFRAN) IV, ondansetron, oxyCODONE-acetaminophen, pentafluoroprop-tetrafluoroeth, sodium chloride  ROS: Constitutional: Denies fevers, chills or abnormal night sweats Eyes: Denies blurriness of vision, double vision or watery eyes Ears, nose, mouth, throat, and face: Denies mucositis or sore throat Respiratory: Denies cough, dyspnea or wheezes Cardiovascular: Denies, chest discomfort or left lower extremity swelling Gastrointestinal:  Denies nausea, heartburn or change in bowel habits Skin: Denies abnormal skin rashes. He does have a  large sacral ulcer cared by the wound team. He also has a healing right transtibial amputation wound. Lymphatics: Denies new lymphadenopathy or easy bruising Neurological:Denies numbness, tingling or new weaknesses Behavioral/Psych: Mood is stable, no new changes  All other systems were reviewed with the patient and are negative.   Family History:    Family History  Problem Relation Age of Onset  . Stroke Mother   . Diabetes Mother   . Hypertension Mother   . Cancer    . Coronary artery disease    . Hypertension Father   . Colon cancer Maternal Grandmother 42  . Hypertension Brother    No family history of hematological  disorders.  Social History:  reports that he has never smoked. He has never used smokeless tobacco. He reports that he does not drink alcohol or use illicit drugs.  Physical Exam    ECOG PERFORMANCE STATUS: 2-3  Filed Vitals:  06/20/13 1245  BP: 192/109  Pulse: 102  Temp: 98.1 F (36.7 C)  Resp: 20   Filed Weights   06/18/13 0844 06/18/13 1316 06/20/13 0730  Weight: 223 lb 12.3 oz (101.5 kg) 217 lb 6 oz (98.6 kg) 227 lb 8.2 oz (103.2 kg)    GENERAL: alert, no distress and lethargic, uncomfortable. He is morbidly obese SKIN: skin color, texture, turgor are normal, except for the areas of wound healing including sacral ulcer and right below the knee amputation  EYES: normal, conjunctiva are pale and non-injected, sclera clear OROPHARYNX:no exudate, no erythema and lips, buccal mucosa, and tongue normal  NECK: supple, thyroid normal size, non-tender, without nodularity. left internal jugular catheter in place LYMPH:  no palpable lymphadenopathy in the cervical, axillary or inguinal LUNGS: clear to auscultation and percussion with normal breathing effort HEART: regular rate & rhythm and no murmurs and no lower extremity edema ABDOMEN:abdomen soft, non-tender and normal bowel sounds Musculoskeletal:no cyanosis of digits and no clubbing  PSYCH: very  lethargic, falls asleep easily  NEURO: not tested as patient falls back asleep and difficult to follow commands.  Labs:  CBC   Recent Labs Lab 06/17/13 0653 06/18/13 0630 06/18/13 1025 06/19/13 0555 06/20/13 0749  WBC 13.8* 15.6* 14.5* 15.5* 14.4*  HGB 7.4* 6.7* 7.3* 8.2* 7.3*  HCT 22.3* 19.8* 21.8* 25.3* 22.6*  PLT 313 330 274 330 308  MCV 89.9 90.0 88.3 90.0 89.7  MCH 29.8 30.5 29.6 29.2 29.0  MCHC 33.2 33.8 33.5 32.4 32.3  RDW 14.8 14.7 14.6 15.2 14.7     CMP    Recent Labs Lab 06/16/13 0610 06/17/13 0653 06/18/13 0630 06/19/13 0555 06/20/13 0749  NA 135* 134* 134* 136* 135*  K 3.8 4.4 4.5 3.9 4.2  CL 93* 94* 94* 97 96  CO2 27 25 24 28 25   GLUCOSE 161* 75 93 55* 66*  BUN 26* 36* 44* 23 34*  CREATININE 5.55* 7.49* 8.98* 5.49* 7.11*  CALCIUM 7.2* 6.9* 6.8* 7.4* 7.0*        Component Value Date/Time   BILITOT 0.6 06/03/2013 0725   BILIDIR <0.1 05/04/2012 1300   IBILI NOT CALCULATED 05/04/2012 1300      Recent Labs Lab 06/16/13 0610 06/17/13 0653 06/18/13 0630 06/19/13 0555 06/20/13 0749  INR 1.17 1.30 1.82* 2.40* 2.77*    No results found for this basename: DDIMER,  in the last 72 hours   Anemia panel:  No results found for this basename: VITAMINB12, FOLATE, FERRITIN, TIBC, IRON, RETICCTPCT,  in the last 72 hours   Imaging Studies: I have reviewed the imaging  Ct Abdomen Pelvis Wo Contrast  05/29/2013    COMPARISON:  Prior ultrasound from 05/05/2011.  Marland Kitchen  FINDINGS: There is a loculated right pleural effusion along the lateral wall of the right hemi thorax. Associated round soft tissue opacity along the anterior aspect of this collection may reflect associated rounded atelectasis. The effusion itself measures simple fluid density. The visualized lung bases are otherwise clear. Mild cardiomegaly noted.  Limited noncontrast evaluation of the liver is unremarkable. Gallbladder within normal limits. No biliary ductal dilatation. The spleen, adrenal  glands, and pancreas demonstrate a normal unenhanced appearance.  Probable small parapelvic cysts noted within the right kidney. 4 mm nonobstructive stone present within the interpolar right kidney. No hydronephrosis or evidence of obstructive uropathy. Mild perinephric fat stranding seen bilaterally.  No evidence of bowel obstruction. Stomach is within normal limits. No abnormal wall thickening or inflammatory fat stranding seen about  the bowels. Appendix is well visualized in the right lower quadrant and is of normal caliber and appearance without associated inflammatory changes to suggest acute appendicitis.  Mild circumferential bladder wall thickening likely related to incomplete distension. Prostate is normal.  No free air or fluid. She scattered atherosclerotic plaques and within the iliac arteries bilaterally.  No acute osseous abnormality. No worrisome lytic or blastic osseous lesions. Scattered degenerative endplate Schmorl's nodes and within the lower thoracic spine.  IMPRESSION: 1. Loculated right pleural effusion with probable associated rounded atelectasis as above, incompletely evaluated on this examination. 2. No CT evidence of acute intra-abdominal or pelvic process. 3. 4 mm nonobstructive right renal calculus.   Electronically Signed   By: Jeannine Boga M.D.   On: 05/29/2013 18:48   Ct Chest Wo Contrast  05/29/2013 COMPARISON:  Abdominal CT from the same day  FINDINGS: THORACIC INLET/BODY WALL:  No acute abnormality.  MEDIASTINUM:  Chronic cardiomegaly. There is mild aortic valvular calcification. No pericardial effusion. No evidence of acute vascular abnormality. Negative for adenopathy.  LUNG WINDOWS:  There is a small right pleural effusion which measures water density. The fluid is anti dependent, loculated in the anterior and lateral lower chest. There is bandlike opacity in the neighboring right middle lobe and lower lobe. This opacity was not seen on 2014 radiography.  Spiculated 11  mm nodule in the superior segment left lower lobe, new from 2008. In the coronal projection, this has an elongated appearance. Small subpleural opacity in the peripheral left lower lobe favors atelectasis given the linear margins. No pulmonary edema.  UPPER ABDOMEN:  Imaged portions of the spleen enlarged at 15 cm maximal span and 8 cm thickness. There has been recent abdominal imaging.  OSSEOUS:  Multiple Schmorl's nodes and osteophytes in the mid and lower thoracic spine.  IMPRESSION: 1. Atelectasis or scarring in the right middle lobe, with small overlying loculated pleural effusion. Findings are nonspecific, and could be post infectious, posttraumatic, or related to prior lung infarct (patient has history of large volume pulmonary embolism in 2008). 2. 11 mm nodule in the superior segment left lower lobe, morphology favoring an infectious or inflammatory focus over neoplastic nodule. Followup chest CT is required in 3 months to ensure clearing.   Electronically Signed   By: Jorje Guild M.D.   On: 05/29/2013 21:15   Assessment/Plan: #1 Acute nonocclusive DVT RUE with jugular, subclavian, axillary, and brachial veins involved Risk factors for this findings include prior history of pulmonary embolism treated with Coumadin, recent surgery, decreased mobility, obesity, among others. Patient is bridging from Heparin to Coumadin per Pharmacy. Continue to monitor PT and INR closely. He will need long term anticoagulation.  There is no benefit to check a hypercoagulable panel. Current anticoagulation therapy will interfere with some the tests and it is not possible to interpret the test results.Taking him off the anticoagulation therapy to do the tests may precipitate another thrombotic event. Goal of therapy is for life. Due to ESRD, the only available long term anticoagulation therapy choice is warfarin, goal INR 2-3, to be managed by PCP.  2. Anemia of chronic disease and acute operative blood loss Hb  today is 8.2 after transfusion of 2 units on 5/25. No transfusions indicated at this time unless Hb is less than 7 or symptomatic.   3.Lung nodule  An 11 mm nodule in the superior segment left lower lobe was found on CT of the chest on 5/5. Agree followup chest CT is required in the  future, to be arranged by PCP   4. Full Code  Other medical issues as per primary team Will sign off. No follow-up appointment is needed  Rondel Jumbo, PA-C 06/20/2013 1:38 PM  Heath Lark, MD 06/20/2013

## 2013-06-20 NOTE — Progress Notes (Addendum)
Patient ID: Steven Logan, male   DOB: [birth year], 43 y.o.   MRN: [id number] [facility] PROGRESS NOTE  Steven Logan [id number] DOB: 1970/02/02 DOA: 06/02/2013 PCP: Tivis Ringer, MD  Brief narrative: 43 year old male with past medical history of morbid obesity, gout, HTN, DLD, PE, Pancreatitis, DM who presented to [facility] ED 06/02/2013 with worsening right ankle swelling for 2 weeks prior to this admission. Patient was started on prednisone for gout treatment initially which did not help. His wound opened up and started to have drainage. Patient subsequently re-presented to the ER. An MRI of the ankle revealed gas-containing abscess and cellulitis. Orthopedics took him emergently to the OR for debridement. He is status post right lower extremity transtibial amputation and remains on IV Zosyn. He was diagnosed with acute nonocclusive DVT right upper extremity and is on heparin bridging and Coumadin.   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   Discussed with Dr Sharol Given, will continue with zosyn due to fevers. If fevers persist might need re evaluation of surgical sit.   Fever; will check for C diff, chest x ray... Blood culture 5-25 no growth to date.   Acute nonocclusive DVT RUE   jugular, subclavian, axillary, and brachial veins involved   Has had recent removal of HD cath from right IJ 5/10 (which has been in place since Aug 2014)   On  heparin for anticoagulation; INR monitored by pharmacy as well  He report darkening of palmar aspect of hands since 2 days ago. Day 5 of coumadin. Will hold coumadin for now. I have ask Hematology to evaluate for coumadin skin necrosis. Also in the differential calciphylaxis. ( will ask renal to evaluate)  Anemia in chronic  kidney disease / acute postoperative blood loss anemia   Has received 2 units PRBC blood transfusion 06/18/2013.  Hb 7.3  Encephalopathy, toxic  -     Improved.   Mental status waxing and waining -worse on HD days   End stage renal disease on HD M/W/F   Per renal management   Hypertension   Stable, BP 155/83  DM / Hyperglycemia   A1c 7.0; good glycemic control   Obesity - morbid  Body mass index is 28.69  HPTH  Continue Calcitrol and renvela   Code Status: full code Family Communication: no family at the bedside  Disposition Plan: home when stable   Consultants:   Ortho   Nephro   Procedures:   I and D on 06/03/2013   IRRIGATION AND DEBRIDEMENT EXTREMITY extensive excision of muscle fascia and soft tissue right leg 06/06/13   Transtibial amputation 5/19   Antibiotics:   Vanc 5/9 >5/17   Zosyn 5/9 >    DVT prophylaxis:  IV heparin   Elmarie Shiley, MD  [facility] Pager [phone]  If 7PM-7AM, please contact night-coverage [email or web address] Password [email or web address] 06/20/2013, 1:01 PM   LOS: 18 days    HPI/Subjective: No overnight events. Patient notices hyperpigmentation, darkening palmar aspect of both hands since 2 days ago  Objective: Filed Vitals:   06/20/13 1100 06/20/13 1130 06/20/13 1200 06/20/13 1209  BP: 164/86 144/59 144/84 139/69  Pulse: 92 94 92 92  Temp:    98.2 F (36.8 C)  TempSrc:    Oral  Resp:  16  Height:      Weight:      SpO2:        Intake/Output Summary (Last 24 hours) at 06/20/13 1301 Last data filed at 06/20/13 1209  Gross per 24 hour  Intake    265 ml  Output   2775 ml  Net  -2510 ml    Exam:   General:  Pt is alert, not in acute distress  Cardiovascular: Regular rate and rhythm, S1/S2 appreciated   Respiratory: Clear to auscultation bilaterally, no wheezing  Abdomen: Soft, non tender, non distended, bowel sounds present  Extremities: right BKA, pulses DP and PT palpable bilaterally; Left AV  fistula   Neuro: Grossly nonfocal  Data Reviewed: Basic Metabolic Panel:  Recent Labs Lab 06/16/13 0610 06/17/13 0653 06/18/13 0630 06/19/13 0555 06/20/13 0749  NA 135* 134* 134* 136* 135*  K 3.8 4.4 4.5 3.9 4.2  CL 93* 94* 94* 97 96  CO2 27 25 24 28 25   GLUCOSE 161* 75 93 55* 66*  BUN 26* 36* 44* 23 34*  CREATININE 5.55* 7.49* 8.98* 5.49* 7.11*  CALCIUM 7.2* 6.9* 6.8* 7.4* 7.0*   Liver Function Tests: No results found for this basename: AST, ALT, ALKPHOS, BILITOT, PROT, ALBUMIN,  in the last 168 hours No results found for this basename: LIPASE, AMYLASE,  in the last 168 hours No results found for this basename: AMMONIA,  in the last 168 hours CBC:  Recent Labs Lab 06/17/13 0653 06/18/13 0630 06/18/13 1025 06/19/13 0555 06/20/13 0749  WBC 13.8* 15.6* 14.5* 15.5* 14.4*  HGB 7.4* 6.7* 7.3* 8.2* 7.3*  HCT 22.3* 19.8* 21.8* 25.3* 22.6*  MCV 89.9 90.0 88.3 90.0 89.7  PLT 313 330 274 330 308   Cardiac Enzymes: No results found for this basename: CKTOTAL, CKMB, CKMBINDEX, TROPONINI,  in the last 168 hours BNP: No components found with this basename: POCBNP,  CBG:  Recent Labs Lab 06/18/13 1655 06/19/13 0759 06/19/13 1137 06/19/13 1601 06/19/13 2241  GLUCAP 119* 153* 72 72 105*    Recent Results (from the past 240 hour(s))  CLOSTRIDIUM DIFFICILE BY PCR     Status: None   Collection Time    06/17/13  1:09 PM      Result Value Ref Range Status   C difficile by pcr NEGATIVE  NEGATIVE Final  CULTURE, BLOOD (ROUTINE X 2)     Status: None   Collection Time    06/18/13  9:15 AM      Result Value Ref Range Status   Specimen Description BLOOD HEMODIALYSIS FISTULA   Final   Special Requests BOTTLES DRAWN AEROBIC AND ANAEROBIC 10CC   Final   Culture  Setup Time     Final   Value: 06/18/2013 16:16     Performed at [facility]   Culture     Final   Value:        BLOOD CULTURE RECEIVED NO GROWTH TO DATE CULTURE WILL BE HELD FOR 5 DAYS BEFORE ISSUING A  FINAL NEGATIVE REPORT     Performed at [facility]   Report Status PENDING   Incomplete  CULTURE, BLOOD (ROUTINE X 2)     Status: None   Collection Time    06/18/13  9:35 AM      Result Value Ref Range Status   Specimen Description BLOOD HEMODIALYSIS FISTULA   Final   Special Requests BOTTLES DRAWN AEROBIC AND ANAEROBIC 10CC   Final   Culture  Setup Time  Final   Value: 06/18/2013 16:17     Performed at [facility]   Culture     Final   Value:        BLOOD CULTURE RECEIVED NO GROWTH TO DATE CULTURE WILL BE HELD FOR 5 DAYS BEFORE ISSUING A FINAL NEGATIVE REPORT     Performed at [facility]   Report Status PENDING   Incomplete     Studies: No results found.  Scheduled Meds: . darbepoetin (ARANESP) injection - DIALYSIS  150 mcg Intravenous Q Fri-HD  . docusate sodium  100 mg Oral BID  . feeding supplement (GLUCERNA SHAKE)  237 mL Oral TID BM  . ferric gluconate (FERRLECIT/NULECIT) IV  125 mg Intravenous Q M,W,F-HD  . gabapentin  100 mg Oral TID  . insulin aspart  0-20 Units Subcutaneous TID WC  . insulin glargine  15 Units Subcutaneous QHS  . labetalol  200 mg Oral BID  . multivitamin  1 tablet Oral QHS  . piperacillin-tazobactam (ZOSYN)  IV  2.25 g Intravenous Q8H  . polyethylene glycol  17 g Oral Daily  . sevelamer carbonate  800 mg Oral TID WC  . sodium chloride  10-40 mL Intracatheter Q12H  . warfarin  1 mg Oral ONCE-1800  . warfarin   Does not apply Once  . Warfarin - Pharmacist Dosing Inpatient   Does not apply q1800   Continuous Infusions: . sodium chloride 10 mL/hr (06/18/13 1704)  . heparin 2,450 Units/hr (06/20/13 0223)

## 2013-06-20 NOTE — Procedures (Signed)
Patient was seen on dialysis and the procedure was supervised. BFR 400 Via LAVF BP is 154/78.  Patient appears to be tolerating treatment well.

## 2013-06-20 NOTE — Consult Note (Signed)
WOC wound consult note Reason for Consult: evaluation of sacral wound, however upon my exam this patient has partial thickness skin loss around the upper portion of the buttocks near the rectum. The skin breakdown started shortly after he has had diarrhea  Wound type:MASD (moisture associated skin damage) Measurement:4 open areas noted  Left buttock 3cm x 4cm x 0.2cm, Right buttock: 2cm x 3cm x 0.2cm; distal right buttock 1.0cmx 2.5cm x 0.2cm  Wound [id number] are clean and partial thickness with superficial skin sloughing the area on the left buttock does have some yellow slough (80%) Drainage (amount, consistency, odor) unable to assess due to the presence of stool  Periwound:intact Dressing procedure/placement/frequency: Nursing reports changing foam dressings 3 times due to stool yesterday, may want to consider fecal containment device.  Since dressings are being soiled so frequently will order on barrier cream.  Air mattress ordered for moisture control.   WOC will follow along with you to continue to assess areas, please contact [place] nurse if areas worsen Para March RN,CWOCN [phone]

## 2013-06-20 NOTE — Progress Notes (Addendum)
ANTICOAGULATION CONSULT NOTE - Follow Up Consult  Pharmacy Consult  :  Coumadin with Heparin bridging  Indication  :  Acute non-occlusive RUE DVT, Hx PE  Heparin Dosing Weight: 99 kg   Recent Labs  06/18/13 0630 06/18/13 1025 06/19/13 0555 06/20/13 0749  HGB 6.7* 7.3* 8.2* 7.3*  HCT 19.8* 21.8* 25.3* 22.6*  PLT 330 274 330 308  LABPROT 20.5*  --  25.4* 28.3*  INR 1.82*  --  2.40* 2.77*  HEPARINUNFRC 0.38  --  0.37 0.40  CREATININE 8.98*  --  5.49* 7.11*     Medications: Scheduled:  . darbepoetin (ARANESP) injection - DIALYSIS  150 mcg Intravenous Q Fri-HD  . docusate sodium  100 mg Oral BID  . feeding supplement (GLUCERNA SHAKE)  237 mL Oral TID BM  . ferric gluconate (FERRLECIT/NULECIT) IV  125 mg Intravenous Q M,W,F-HD  . gabapentin  100 mg Oral TID  . insulin aspart  0-20 Units Subcutaneous TID WC  . insulin glargine  15 Units Subcutaneous QHS  . labetalol  200 mg Oral BID  . multivitamin  1 tablet Oral QHS  . piperacillin-tazobactam (ZOSYN)  IV  2.25 g Intravenous Q8H  . polyethylene glycol  17 g Oral Daily  . sevelamer carbonate  800 mg Oral TID WC  . sodium chloride  10-40 mL Intracatheter Q12H  . warfarin   Does not apply Once  . Warfarin - Pharmacist Dosing Inpatient   Does not apply q1800    Infusions:  . sodium chloride 10 mL/hr (06/18/13 1704)  . heparin 2,450 Units/hr (06/20/13 0223)    Assessment:  Day # 6 of Coumadin with Heparin bridging for RUE DVT and Hx of PE.  Heparin rate 2450 units/hr,  Heparin level 0.4 units/ml  INR 2.4 > 2.77 after 5 doses of Coumadin.  INR has been > 2 over 24 hours. No evidence of bleeding complications noted  Will stop Heparin infusion tonight since INR will have been therapeutic for ~ 36 hours.  Goal:  Heparin Level  0.3 - 0.7 units/ml  INR 2-3   Plan: 1. Discontinue Heparin infusion tonight, 2200 pm. 2. Repeat Coumadin 1 mg po today. 3. Daily INR's, Platelet count, CBC.  Monitor for bleeding  complications. Follow Platelet count.   Nandana Krolikowski, Craig Guess,  Pharm.D  06/20/2013, 12:12 PM   06/20/2013, 1:38 PM    ADDENDUM:   Coumadin has been discontinued for now as per MD.  Heparin to be continued at same rate pending decisions regarding oral anticoagulation. Daily Heparin Levels, INR, Platelet counts, CBC.  Monitor for bleeding complications.   Kaylor Simenson, Craig Guess,  Pharm.D. ,

## 2013-06-20 NOTE — Progress Notes (Signed)
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 Requesting IV pain medication, and irritable when told that IV pain med had been discontinued.  Notified NP on call; received orders for percocet 2 tabs q4 hrs prn and discontinued vicodin.  Patient educated on reason for discontinuation of IV pain medication; verbalized understanding.  Will continue to assess patient.

## 2013-06-21 LAB — BASIC METABOLIC PANEL
BUN: 19 mg/dL (ref 6–23)
CALCIUM: 7.3 mg/dL — AB (ref 8.4–10.5)
CO2: 26 meq/L (ref 19–32)
Chloride: 95 mEq/L — ABNORMAL LOW (ref 96–112)
Creatinine, Ser: 4.92 mg/dL — ABNORMAL HIGH (ref 0.50–1.35)
GFR calc Af Amer: 15 mL/min — ABNORMAL LOW (ref >90)
GFR calc non Af Amer: 13 mL/min — ABNORMAL LOW (ref >90)
Glucose, Bld: 45 mg/dL — ABNORMAL LOW (ref 70–99)
Potassium: 3.7 mEq/L (ref 3.7–5.3)
SODIUM: 133 meq/L — AB (ref 137–147)

## 2013-06-21 LAB — CBC
HCT: 23.9 % — ABNORMAL LOW (ref 39.0–52.0)
Hemoglobin: 7.5 g/dL — ABNORMAL LOW (ref 13.0–17.0)
MCH: 28.5 pg (ref 26.0–34.0)
MCHC: 31.4 g/dL (ref 30.0–36.0)
MCV: 90.9 fL (ref 78.0–100.0)
PLATELETS: 318 10*3/uL (ref 150–400)
RBC: 2.63 MIL/uL — ABNORMAL LOW (ref 4.22–5.81)
RDW: 14.4 % (ref 11.5–15.5)
WBC: 15.4 10*3/uL — ABNORMAL HIGH (ref 4.0–10.5)

## 2013-06-21 LAB — HEPARIN LEVEL (UNFRACTIONATED): HEPARIN UNFRACTIONATED: 0.28 [IU]/mL — AB (ref 0.30–0.70)

## 2013-06-21 LAB — GLUCOSE, CAPILLARY
GLUCOSE-CAPILLARY: 52 mg/dL — AB (ref 70–99)
GLUCOSE-CAPILLARY: 85 mg/dL (ref 70–99)
GLUCOSE-CAPILLARY: 98 mg/dL (ref 70–99)
Glucose-Capillary: 50 mg/dL — ABNORMAL LOW (ref 70–99)
Glucose-Capillary: 69 mg/dL — ABNORMAL LOW (ref 70–99)
Glucose-Capillary: 135 mg/dL — ABNORMAL HIGH (ref 70–99)
Glucose-Capillary: 96 mg/dL (ref 70–99)

## 2013-06-21 LAB — PROTIME-INR
INR: 2.36 — AB (ref 0.00–1.49)
Prothrombin Time: 25 seconds — ABNORMAL HIGH (ref 11.6–15.2)

## 2013-06-21 LAB — CLOSTRIDIUM DIFFICILE BY PCR: Toxigenic C. Difficile by PCR: NEGATIVE

## 2013-06-21 MED ORDER — WARFARIN - PHARMACIST DOSING INPATIENT
Freq: Every day | Status: DC
  Administered 2013-06-22 – 2013-06-23 (×2)

## 2013-06-21 MED ORDER — LOPERAMIDE HCL 2 MG PO CAPS
2.0000 mg | ORAL_CAPSULE | Freq: Once | ORAL | Status: AC
  Administered 2013-06-21: 2 mg via ORAL
  Filled 2013-06-21 (×2): qty 1

## 2013-06-21 MED ORDER — BOOST / RESOURCE BREEZE PO LIQD
1.0000 | Freq: Two times a day (BID) | ORAL | Status: DC
  Administered 2013-06-21 – 2013-06-24 (×5): 1 via ORAL

## 2013-06-21 MED ORDER — WARFARIN SODIUM 2 MG PO TABS
2.0000 mg | ORAL_TABLET | Freq: Once | ORAL | Status: AC
  Filled 2013-06-21: qty 1

## 2013-06-21 NOTE — Progress Notes (Signed)
Infection control called and pt can come off contact due to c-diff neg and MSSA not resistant.

## 2013-06-21 NOTE — Progress Notes (Signed)
CBG re-check 135.

## 2013-06-21 NOTE — Progress Notes (Signed)
Physical Therapy Treatment Patient Details Name: Steven Logan MRN: [id number] DOB: Feb 06, 1970 Today's Date: 06/21/2013    History of Present Illness Pt admitted 06/02/13 with sepsis and R LE abscess and cellulitis.  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  Pt with ESRD and DM.    PT Comments    Pt. Initially in bed, agreeable to PT session.  Pt. Had low blood sugar of 50 before start of session.  Pt. Sat at EOB about 10 minutes to eat remainder of crackers he had been given and drank a 3rd cup of apple juice given by RN.  Pt.'s blood sugar rechecked and was at 69.  Pt. Still with generalized weakness but able tolerate assisted transfer.  Pt. Left on 2 pillows side by side in recliner chair for additional pressure redistribution.  Pt. States he is rolling himself in bed to stay off his back .  He needs encouragement that he is progressing and this was provided.  I discussed need for pt. To have chair cushion with Jonelle Sidle and she will order cushion for pt. Noted order for BID PT sessions.  Disucssed with Dr. Tyrell Antonio that pt. Is on 3x/week therapy schedule per our department protocol.  I have recommended to Dr. Tyrell Antonio and to Jonelle Sidle, nursing director that nursing staff have pt. Up for all meals if possible but at least 2x/day with use of lift equipment .  Jonelle Sidle to pass along to staff.    Follow Up Recommendations  SNF     Equipment Recommendations  Rolling walker with 5" wheels;3in1 (PT);Wheelchair (measurements PT);Wheelchair cushion (measurements PT)    Recommendations for Other Services       Precautions / Restrictions Precautions Precautions: Fall Precaution Comments: Again educated on importance of keeping R residual limb as shtraight as possible at the knee in preparation for prosthesis.  Pt. verbalizes understanding and is agreeable to complete 10 quad sets per hour with emphasis on achieving full knee extension Restrictions Weight Bearing Restrictions:  Yes RLE Weight Bearing: Non weight bearing Other Position/Activity Restrictions: s/p R BKA    Mobility  Bed Mobility Overal bed mobility: Needs Assistance Bed Mobility: Rolling;Sidelying to Sit Rolling: Min assist Sidelying to sit: Mod assist       General bed mobility comments: pt. able to initiate rolling , needed mod assist at shoulders to move side to sit  Transfers Overall transfer level: Needs assistance Equipment used: Rolling walker (2 wheeled) Transfers: Sit to/from Stand Sit to Stand: Mod assist;+2 physical assistance Stand pivot transfers: Mod assist;+2 physical assistance       General transfer comment: Pt. able to stand upright with RW and 2 assist to rise to stand.  Has difficulty extending right elbow initially to supprot self on RW with R UE.  Pt. able to initiate pivot to recliner but needed 2 assist to complete  pivot  and to control descent.  Pt. positioned on pillows in bottom of recliner for pressure relief .    Ambulation/Gait Ambulation/Gait assistance:  (pt. unable)               Stairs            Wheelchair Mobility    Modified Rankin (Stroke Patients Only)       Balance  Cognition Arousal/Alertness: Awake/alert Behavior During Therapy: WFL for tasks assessed/performed Overall Cognitive Status: Within Functional Limits for tasks assessed (cognitive status improved from my last session with him )                      Exercises General Exercises - Lower Extremity Ankle Circles/Pumps: Left;AROM;5 reps Quad Sets: Both;Strengthening;20 reps;Supine Gluteal Sets: AROM;Both;10 reps Long Arc Quad: AROM;10 reps;Left;Seated Hip ABduction/ADduction: AAROM;Right;10 reps;Seated    General Comments        Pertinent Vitals/Pain See vitals tab  Pt. Noted pain in residual limb once he was sitting at edge of bed but it decreased when legs elevated in recliner chair.     Home  Living                      Prior Function            PT Goals (current goals can now be found in the care plan section) Progress towards PT goals: Progressing toward goals    Frequency  Min 4X/week    PT Plan Current plan remains appropriate    Co-evaluation             End of Session Equipment Utilized During Treatment: Gait belt Activity Tolerance: Patient tolerated treatment well;Patient limited by fatigue Patient left: in chair;with call bell/phone within reach;with chair alarm set     Time: [id number] PT Time Calculation (min): 42 min  Charges:  $Therapeutic Exercise: 8-22 mins $Therapeutic Activity: 23-37 mins                    G CodesLadona Ridgel 06/21/2013, 12:16 PM Gerlean Ren PT Acute Rehab Services [place] [phone]

## 2013-06-21 NOTE — Progress Notes (Signed)
Pt in room to work with patient.

## 2013-06-21 NOTE — Progress Notes (Signed)
ANTICOAGULATION CONSULT NOTE - Follow Up Consult  Pharmacy Consult for Heparin  Indication: DVT  No Known Allergies  Patient Measurements: Height: 6\' 1"  (185.4 cm) Weight: 227 lb 8.2 oz (103.2 kg) IBW/kg (Calculated) : 79.9  Vital Signs: Temp: 98.3 F (36.8 C) (05/28 0642) Temp src: Oral (05/28 0642) BP: 120/56 mmHg (05/28 0642) Pulse Rate: 85 (05/28 0642)  Labs:  Recent Labs  06/19/13 0555 06/20/13 0749 06/21/13 0630  HGB 8.2* 7.3* 7.5*  HCT 25.3* 22.6* 23.9*  PLT 330 308 318  LABPROT 25.4* 28.3* 25.0*  INR 2.40* 2.77* 2.36*  HEPARINUNFRC 0.37 0.40 0.28*  CREATININE 5.49* 7.11*  --    Estimated Creatinine Clearance: 16.9 ml/min (by C-G formula based on Cr of 7.11).  Medications:  Heparin 2450 units/hr  Assessment: 43 y/o M on heparin for DVT. HL is 0.28. Warfarin currently on hold. Other labs as above.   Goal of Therapy:  Heparin level 0.3-0.7 units/ml Monitor platelets by anticoagulation protocol: Yes   Plan:  -Increase heparin drip to 2550 units/hr -1530 HL -Daily CBC/HL -Monitor for bleeding -F/U plans for oral anticoagulation   Narda Bonds 06/21/2013,7:21 AM

## 2013-06-21 NOTE — Progress Notes (Addendum)
INITIAL NUTRITION ASSESSMENT  DOCUMENTATION CODES Per approved criteria  -Not Applicable   INTERVENTION: Discontinue Glucerna Shakes - pt reports that they are causing GI distress. Add Resource Breeze po TID, each supplement provides 250 kcal and 9 grams of protein Continue Renal MVI daily. Reviewed "Additional Selections" portion of menu with patient and family to encourage additional po intake. RD to continue to follow nutrition care plan.  NUTRITION DIAGNOSIS: Increased nutrient needs related to wound healing and HD as evidenced by estimated needs.   Goal: Intake to meet >90% of estimated nutrition needs.  Monitor:  weight trends, lab trends, I/O's, PO intake, supplement tolerance  Reason for Assessment: Health History  43 y.o. male  Admitting Dx: Severe sepsis  ASSESSMENT: PMHx significant for DM, HTN, dyslipidemia, ESRD (on HD). Admitted with several week hx of swelling in R ankle. Work-up reveals gangrenous R leg, septic R ankle.  Underwent I&D of R ankle on 5/10. Underwent excision fascia of the deep and superficial and lateral compartments of R leg, irrigation of R ankle, and partial excision of the muscles of the deep superficial and lateral compartments of R leg on 5/11. 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  Per ortho MD note on 5/24 - amputation site is healing nicely.  Pt developed acute non-occlusive DVT of RUE with jugular, subclavian, axillary and brachial veins involved.  Pt with MASD to sacrum.  Currently ordered for Renal/CHO Modified Diet. Pt reports that his appetite is fair. Family is asking why patient always seems to get the same foods over and over - such as green beans and grilled chicken. We reviewed the menu and discussed other appropriate options.  Pt reports that his Glucerna Shakes are causing him to have  diarrhea. Agreeable to [employer].  Pt is currently 93% of his usual body weight. This is consistent with expected 6.5% wt loss from amputation.  CBG's: 85 - 135 Potasium WNL  Height: Ht Readings from Last 1 Encounters:  06/03/13 6\' 1"  (1.854 m)    Weight: Wt Readings from Last 1 Encounters:  06/20/13 227 lb 8.2 oz (103.2 kg)   Dry weight per renal: 111.5 kg  Adjusted body weight using Standard Body Weight Equation: 98.5 kg  Ideal Body Weight: 172 lb/78.2 kg (adjusted for BKA)  % Ideal Body Weight: 132%  Wt Readings from Last 10 Encounters:  06/20/13 227 lb 8.2 oz (103.2 kg)  06/20/13 227 lb 8.2 oz (103.2 kg)  06/20/13 227 lb 8.2 oz (103.2 kg)  06/20/13 227 lb 8.2 oz (103.2 kg)  06/20/13 227 lb 8.2 oz (103.2 kg)  05/29/13 251 lb (113.853 kg)  05/22/13 250 lb (113.399 kg)  11/15/12 264 lb (119.75 kg)  11/02/12 265 lb (120.203 kg)  11/02/12 265 lb (120.203 kg)    Usual Body Weight: 111.5 kg  % Usual Body Weight: 93%  BMI:  24.3 - adjusted for BKA  Estimated Nutritional Needs: Kcal: 2300 - 2600 Protein: >118 g daily Fluid: 1.2 liters  Skin:  MASD to buttocks R leg amputation site  Diet Order: Diabetic and Renal  EDUCATION NEEDS: -No education needs identified at this time   Intake/Output Summary (Last 24 hours) at 06/21/13 1515 Last data filed at 06/21/13 1010  Gross per 24 hour  Intake    360 ml  Output      0 ml  Net  360 ml    Last BM: 5/27  Labs:   Recent Labs Lab 06/19/13 0555 06/20/13 0749 06/21/13 0630  NA 136* 135* 133*  K 3.9 4.2 3.7  CL 97 96 95*  CO2 28 25 26   BUN 23 34* 19  CREATININE 5.49* 7.11* 4.92*  CALCIUM 7.4* 7.0* 7.3*  GLUCOSE 55* 66* 45*    CBG (last 3)   Recent Labs  06/21/13 0831 06/21/13 0942 06/21/13 1304  GLUCAP 69* 135* 85    Scheduled Meds: . darbepoetin (ARANESP) injection - DIALYSIS  150 mcg Intravenous Q Fri-HD  . feeding supplement (GLUCERNA SHAKE)  237 mL Oral TID BM  . ferric  gluconate (FERRLECIT/NULECIT) IV  125 mg Intravenous Q M,W,F-HD  . gabapentin  100 mg Oral TID  . insulin aspart  0-20 Units Subcutaneous TID WC  . labetalol  200 mg Oral BID  . multivitamin  1 tablet Oral QHS  . piperacillin-tazobactam (ZOSYN)  IV  2.25 g Intravenous Q8H  . sevelamer carbonate  800 mg Oral TID WC  . sodium chloride  10-40 mL Intracatheter Q12H  . warfarin  2 mg Oral ONCE-1800  . Warfarin - Pharmacist Dosing Inpatient   Does not apply q1800    Continuous Infusions: . sodium chloride 10 mL/hr (06/18/13 1704)    Past Medical History  Diagnosis Date  . Eczema   . Morbid obesity   . Gout   . HTN (hypertension)   . Dyslipidemia   . Pulmonary embolism     3  in  lungs  at  one time...  . Abscess     great toe  . Pancreatitis   . Arthritis   . Depression   . Diabetes mellitus   . Anemia   . Pneumonia     2-3 years ago  . Shortness of breath     ?? chest  cold he has now.  10/8- no SOB  . Renal hypertension     Hemo started  Sept 2014- MWF  . Chronic kidney disease     esrd    Past Surgical History  Procedure Laterality Date  . None    . Av fistula placement Left 04/07/2012    Procedure: ARTERIOVENOUS (AV) FISTULA CREATION;  Surgeon: Angelia Mould, MD;  Location: [place];  Service: Vascular;  Laterality: Left;  . Ligation of competing branches of arteriovenous fistula Left 09/19/2012    Procedure: LIGATION OF COMPETING BRANCHES OF LEFT ARM ARTERIOVENOUS FISTULA; Vein angioplasty;  Surgeon: Angelia Mould, MD;  Location: [place];  Service: Vascular;  Laterality: Left;  . Insertion of dialysis catheter Right 09/19/2012    Procedure: INSERTION OF DIALYSIS CATHETER;  Surgeon: Angelia Mould, MD;  Location: [place];  Service: Vascular;  Laterality: Right;  . I&d extremity Right 06/02/2013    Procedure: IRRIGATION AND DEBRIDEMENT ARTHROSCOPIC RIGHT ANKLE;  Surgeon: Augustin Schooling, MD;  Location: [place];  Service: Orthopedics;  Laterality: Right;  .  I&d extremity Right 06/04/2013    Procedure: IRRIGATION AND DEBRIDEMENT RIGHT FOOT/ANKLE;  Surgeon: Newt Minion, MD;  Location: [place];  Service: Orthopedics;  Laterality: Right;  . I&d extremity Right 06/06/2013    Procedure: IRRIGATION AND DEBRIDEMENT EXTREMITY;  Surgeon: Newt Minion, MD;  Location: [place];  Service: Orthopedics;  Laterality: Right;  . Amputation Right 06/12/2013    Procedure: Transtibial Amputation;  Surgeon: Newt Minion, MD;  Location: [place];  Service: Orthopedics;  Laterality: Right;    Aldona Bar  Lenard Forth, RD, LDN Inpatient Registered Dietitian Pager: [phone] After-hours pager: [phone]

## 2013-06-21 NOTE — Progress Notes (Signed)
Bs re-check 16

## 2013-06-21 NOTE — Progress Notes (Signed)
ANTICOAGULATION CONSULT NOTE - Follow Up Consult  Pharmacy Consult  :  Coumadin with Heparin bridging  Indication  :  Acute non-occlusive RUE DVT, Hx PE  Pt Weight: 103 kg   Recent Labs  06/19/13 0555 06/20/13 0749 06/21/13 0630  HGB 8.2* 7.3* 7.5*  HCT 25.3* 22.6* 23.9*  PLT 330 308 318  LABPROT 25.4* 28.3* 25.0*  INR 2.40* 2.77* 2.36*  HEPARINUNFRC 0.37 0.40 0.28*  CREATININE 5.49* 7.11* 4.92*    Medications: Scheduled:  . darbepoetin (ARANESP) injection - DIALYSIS  150 mcg Intravenous Q Fri-HD  . feeding supplement (GLUCERNA SHAKE)  237 mL Oral TID BM  . ferric gluconate (FERRLECIT/NULECIT) IV  125 mg Intravenous Q M,W,F-HD  . gabapentin  100 mg Oral TID  . insulin aspart  0-20 Units Subcutaneous TID WC  . labetalol  200 mg Oral BID  . loperamide  2 mg Oral Once  . multivitamin  1 tablet Oral QHS  . piperacillin-tazobactam (ZOSYN)  IV  2.25 g Intravenous Q8H  . sevelamer carbonate  800 mg Oral TID WC  . sodium chloride  10-40 mL Intracatheter Q12H    Infusions:  . sodium chloride 10 mL/hr (06/18/13 1704)   HEPARIN INFUSION discontinued     Assessment:  43 Y/O male on Day # 7 on Coumadin with Heparin bridging for Acute non-occlusive RUE DVT, Hx PE.  Hematology review of skin discoloration rules out possible onset of Coumadin necrosis.  Coumadin restarted per Pharmacy consult.  INR today 2.36 although patient did not receive any Coumadin yesterday.  No evidence of bleeding complications noted   Today is Day # 3 with therapeutic INR > = 2.0.  Heparin has been discontinued.  Goal:  INR 2-3   Plan: 1. Discontinue Heparin [already discontinued].  2. Coumadin 2 mg po today. Daily INR's, Platelet count, CBC.  Monitor for bleeding complications.   Daryll Spisak, Craig Guess,  Pharm.D  06/21/2013, 12:33 PM

## 2013-06-21 NOTE — Progress Notes (Signed)
Patient ID: Steven Logan, male   DOB: 1970-12-23, 43 y.o.   MRN: [id number] [facility] PROGRESS NOTE  Steven Logan [id number] DOB: 01/13/1971 DOA: 06/02/2013 PCP: Tivis Ringer, MD  Brief narrative: 43 year old male with past medical history of morbid obesity, gout, HTN, DLD, PE, Pancreatitis, DM who presented to [facility] ED 06/02/2013 with worsening right ankle swelling for 2 weeks prior to this admission. Patient was started on prednisone for gout treatment initially which did not help. His wound opened up and started to have drainage. Patient subsequently re-presented to the ER. An MRI of the ankle revealed gas-containing abscess and cellulitis. Orthopedics took him emergently to the OR for debridement. He is status post right lower extremity transtibial amputation and remains on IV Zosyn. He was diagnosed with acute nonocclusive DVT right upper extremity and is on heparin bridging and Coumadin.   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   Discussed with Dr Sharol Given, will continue with zosyn due to fevers. If fevers persist might need re evaluation of surgical site.   Fever; C diff negative. , chest x ray atelectasis... Blood culture 5-25 no growth to date. Afebrile.   Acute nonocclusive DVT RUE   jugular, subclavian, axillary, and brachial veins involved   Has had recent removal of HD cath from right IJ 5/10 (which has been in place since Aug 2014)   On  heparin for anticoagulation; INR monitored by pharmacy as well  Discussed with Dr Alvy Bimler, she doesn't think that hyperpigmentation is related to coumadin.   Will resume coumadin. He will need long term anticoagulation.    Anemia in chronic kidney disease / acute postoperative blood loss anemia   Has  received 2 units PRBC blood transfusion 06/18/2013.  Hb 7.3  Encephalopathy, toxic  -     Improved.   Mental status waxing and waining -worse on HD days   End stage renal disease on HD M/W/F   Per renal management   Hypertension   Stable, BP 155/83  DM / Hyperglycemia   A1c 7.0; good glycemic control   Hypoglycemia. Will stop lantus.   Obesity - morbid  Body mass index is 28.69  HPTH  Continue Calcitrol and renvela   Diarrhea; will stop laxatives.   Code Status: full code Family Communication: no family at the bedside  Disposition Plan: probably,      Consultants:   Ortho   Nephro   Procedures:   I and D on 06/03/2013   IRRIGATION AND DEBRIDEMENT EXTREMITY extensive excision of muscle fascia and soft tissue right leg 06/06/13   Transtibial amputation 5/19   Antibiotics:   Vanc 5/9 >5/17   Zosyn 5/9 >    DVT prophylaxis:  IV heparin   Elmarie Shiley, MD  [facility] Pager [phone]  If 7PM-7AM, please contact night-coverage [email or web address] Password [email or web address] 06/21/2013, 11:08 AM   LOS: 19 days    HPI/Subjective: Feeling ok, still with diarrhea.  Objective: Filed Vitals:   06/20/13 1707 06/20/13 2235 06/21/13 0642 06/21/13 1009  BP: 172/88 168/84 120/56 189/75  Pulse: 99 104 85 94  Temp: 97.7 F (36.5 C) 98.1 F (36.7 C) 98.3 F (36.8 C) 98.2 F (36.8 C)  TempSrc: Oral Oral Oral Oral  Resp: 18 18 18 18   Height:  Weight:      SpO2: 98% 96% 97% 100%    Intake/Output Summary (Last 24 hours) at 06/21/13 1108 Last data filed at 06/21/13 1010  Gross per 24 hour  Intake    600 ml  Output   2775 ml  Net  -2175 ml    Exam:   General:  Pt is alert, not in acute distress  Cardiovascular: Regular rate and rhythm, S1/S2 appreciated   Respiratory: Clear to auscultation bilaterally, no wheezing  Abdomen: Soft, non tender, non distended, bowel sounds present  Extremities: right BKA, pulses DP and PT palpable  bilaterally; Left AV fistula   Neuro: Grossly nonfocal  Data Reviewed: Basic Metabolic Panel:  Recent Labs Lab 06/17/13 0653 06/18/13 0630 06/19/13 0555 06/20/13 0749 06/21/13 0630  NA 134* 134* 136* 135* 133*  K 4.4 4.5 3.9 4.2 3.7  CL 94* 94* 97 96 95*  CO2 25 24 28 25 26   GLUCOSE 75 93 55* 66* 45*  BUN 36* 44* 23 34* 19  CREATININE 7.49* 8.98* 5.49* 7.11* 4.92*  CALCIUM 6.9* 6.8* 7.4* 7.0* 7.3*   Liver Function Tests: No results found for this basename: AST, ALT, ALKPHOS, BILITOT, PROT, ALBUMIN,  in the last 168 hours No results found for this basename: LIPASE, AMYLASE,  in the last 168 hours No results found for this basename: AMMONIA,  in the last 168 hours CBC:  Recent Labs Lab 06/18/13 0630 06/18/13 1025 06/19/13 0555 06/20/13 0749 06/21/13 0630  WBC 15.6* 14.5* 15.5* 14.4* 15.4*  HGB 6.7* 7.3* 8.2* 7.3* 7.5*  HCT 19.8* 21.8* 25.3* 22.6* 23.9*  MCV 90.0 88.3 90.0 89.7 90.9  PLT 330 274 330 308 318   Cardiac Enzymes: No results found for this basename: CKTOTAL, CKMB, CKMBINDEX, TROPONINI,  in the last 168 hours BNP: No components found with this basename: POCBNP,  CBG:  Recent Labs Lab 06/20/13 2234 06/21/13 0744 06/21/13 0811 06/21/13 0831 06/21/13 0942  GLUCAP 145* 50* 52* 69* 135*    Recent Results (from the past 240 hour(s))  CLOSTRIDIUM DIFFICILE BY PCR     Status: None   Collection Time    06/17/13  1:09 PM      Result Value Ref Range Status   C difficile by pcr NEGATIVE  NEGATIVE Final  CULTURE, BLOOD (ROUTINE X 2)     Status: None   Collection Time    06/18/13  9:15 AM      Result Value Ref Range Status   Specimen Description BLOOD HEMODIALYSIS FISTULA   Final   Special Requests BOTTLES DRAWN AEROBIC AND ANAEROBIC 10CC   Final   Culture  Setup Time     Final   Value: 06/18/2013 16:16     Performed at [facility]   Culture     Final   Value:        BLOOD CULTURE RECEIVED NO GROWTH TO DATE CULTURE WILL BE HELD FOR 5 DAYS  BEFORE ISSUING A FINAL NEGATIVE REPORT     Performed at [facility]   Report Status PENDING   Incomplete  CULTURE, BLOOD (ROUTINE X 2)     Status: None   Collection Time    06/18/13  9:35 AM      Result Value Ref Range Status   Specimen Description BLOOD HEMODIALYSIS FISTULA   Final   Special Requests BOTTLES DRAWN AEROBIC AND ANAEROBIC 10CC   Final   Culture  Setup Time     Final   Value: 06/18/2013 16:17  Performed at [facility]     Final   Value:        BLOOD CULTURE RECEIVED NO GROWTH TO DATE CULTURE WILL BE HELD FOR 5 DAYS BEFORE ISSUING A FINAL NEGATIVE REPORT     Performed at [facility]   Report Status PENDING   Incomplete  CLOSTRIDIUM DIFFICILE BY PCR     Status: None   Collection Time    06/20/13  5:48 PM      Result Value Ref Range Status   C difficile by pcr NEGATIVE  NEGATIVE Final     Studies: Dg Chest 2 View  06/20/2013   CLINICAL DATA:  Fever.  EXAM: CHEST - 2 VIEW  COMPARISON:  06/18/2013  FINDINGS: There is stable positioning of a left jugular central line with the catheter tip at the brachiocephalic venous confluence. There is some residual scarring and atelectasis at the left lung base. Lung volumes are low. No overt edema or airspace consolidation is seen. No evidence of pleural effusion. The heart is mildly enlarged and stable in appearance. The bony thorax shows stable degenerative changes of the thoracic spine.  IMPRESSION: Scarring/atelectasis at the left lung base.   Electronically Signed   By: Aletta Edouard M.D.   On: 06/20/2013 14:24    Scheduled Meds: . darbepoetin (ARANESP) injection - DIALYSIS  150 mcg Intravenous Q Fri-HD  . feeding supplement (GLUCERNA SHAKE)  237 mL Oral TID BM  . ferric gluconate (FERRLECIT/NULECIT) IV  125 mg Intravenous Q M,W,F-HD  . gabapentin  100 mg Oral TID  . insulin aspart  0-20 Units Subcutaneous TID WC  . labetalol  200 mg Oral BID  . loperamide  2 mg Oral Once  . multivitamin   1 tablet Oral QHS  . piperacillin-tazobactam (ZOSYN)  IV  2.25 g Intravenous Q8H  . sevelamer carbonate  800 mg Oral TID WC  . sodium chloride  10-40 mL Intracatheter Q12H   Continuous Infusions: . sodium chloride 10 mL/hr (06/18/13 1704)  . heparin 2,550 Units/hr (06/21/13 [id number])

## 2013-06-21 NOTE — Progress Notes (Signed)
I have seen and examined this patient and agree with plan as outlined by D. Zeyfang, PA-C. Broad[place] John A Naithen Riven[place],MD 06/21/2013 11:12 AM

## 2013-06-21 NOTE — Progress Notes (Signed)
Called by primary RN to see pt for episode of "shaking & unclear speech".  On assessment pt with temp 101.9 rectal other VSS.  Pt is alert & able to answer questions & follow commands. MAEE, no ataxia, speech clear & fluid.  Pt injects humor into conversation. Pt with c/o pain Lt shoulder which is not new & pain RLE.   Pt is nonfocal & at baseline per RN.  Will cont. To monitor as needed.

## 2013-06-21 NOTE — Progress Notes (Signed)
Pt CBG 50. Ptasymptomatic to bs.  Gave apple juice and snack.  Pt alert and oriented X 4. Skin warm and dry.

## 2013-06-21 NOTE — Progress Notes (Signed)
Subjective:  Up in chair with PT/ Tolerated HD yesterday/ Much more alert and cognitive  this am  Objective Vital signs in last 24 hours: Filed Vitals:   06/20/13 1245 06/20/13 1707 06/20/13 2235 06/21/13 0642  BP: 192/109 172/88 168/84 120/56  Pulse: 102 99 104 85  Temp: 98.1 F (36.7 C) 97.7 F (36.5 C) 98.1 F (36.7 C) 98.3 F (36.8 C)  TempSrc: Oral Oral Oral Oral  Resp: 20 18 18 18   Height:      Weight:      SpO2: 98% 98% 96% 97%    Physical Exam:  General: alert OX4 ,NAD  Heart: RRR  Lungs: CTA. unlabored  Abdomen:obese, soft, nontender +BS  Extremities: R BKA wrapped / L 1+ pedal edema Access: L Upper AVF + bruit  HD: [facility]  4.5h 111.5kg 2/2.5 Bath 500/800 Heparin 11000 LFA AVF  Calcitriol 1.25ug EPO 8000 Venofer s/p load completed 5/11  HD: [facility]  4.5h 111.5kg 2/2.5 Bath 500/800 Heparin 11000 LFA AVF  Calcitriol 1.25ug EPO 8000 Venofer s/p load completed 5/11   Assessment:  1. 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 Sharol Given rx  2. AMS= fever Infec / vs pain med=tapered back pain meds /and afebrile / MS baseline now 3 ESRD on HD schedule MWF= resuming low dose hep 2500 x 2 prn  4. RUE DVT- heparin gtt per pharm, coumadin started  5. Anemia - hgb 7.1>8.2 >7.5 has had 3u prbc transfused, on aranesp 150/wk, Fe low > 1gm load in progress  6. HPTH- Ca+ 7.3. Cont calcitriol renvela / ck labs pre hd in am 7. HTN/volume - BP 120 /56 this am , resumed labetalol, tolerating uf challenge on hd/ below edw/  Wt pre yest 103 NO post wt noted/ next HD in am  challenge more   8. Nutrition -  Nepro, mulitivit. "Good  appetite  This am" on solids now 9. Dispo- for [facility] per rehab team   Ernest Haber, PA-C [facility] [phone] 06/21/2013,8:17 AM  LOS: 19 days   Labs: Basic Metabolic Panel:  Recent Labs Lab 06/19/13 0555 06/20/13 0749 06/21/13 0630  NA 136* 135* 133*  K 3.9 4.2  3.7  CL 97 96 95*  CO2 28 25 26   GLUCOSE 55* 66* 45*  BUN 23 34* 19  CREATININE 5.49* 7.11* 4.92*  CALCIUM 7.4* 7.0* 7.3*  CBC:  Recent Labs Lab 06/18/13 0630 06/18/13 1025 06/19/13 0555 06/20/13 0749 06/21/13 0630  WBC 15.6* 14.5* 15.5* 14.4* 15.4*  HGB 6.7* 7.3* 8.2* 7.3* 7.5*  HCT 19.8* 21.8* 25.3* 22.6* 23.9*  MCV 90.0 88.3 90.0 89.7 90.9  PLT 330 274 330 308 318  CBG:  Recent Labs Lab 06/19/13 2241 06/20/13 1241 06/20/13 1704 06/20/13 2234 06/21/13 0744  GLUCAP 105* 71 113* 145* 50*    Studies/Results: Dg Chest 2 View  06/20/2013   CLINICAL DATA:  Fever.  EXAM: CHEST - 2 VIEW  COMPARISON:  06/18/2013  FINDINGS: There is stable positioning of a left jugular central line with the catheter tip at the brachiocephalic venous confluence. There is some residual scarring and atelectasis at the left lung base. Lung volumes are low. No overt edema or airspace consolidation is seen. No evidence of pleural effusion. The heart is mildly enlarged and stable in appearance. The bony thorax shows stable degenerative changes of the thoracic spine.  IMPRESSION: Scarring/atelectasis at the left lung  base.   Electronically Signed   By: Aletta Edouard M.D.   On: 06/20/2013 14:24   Medications: . sodium chloride 10 mL/hr (06/18/13 1704)  . heparin 2,450 Units/hr (06/20/13 1549)   . darbepoetin (ARANESP) injection - DIALYSIS  150 mcg Intravenous Q Fri-HD  . docusate sodium  100 mg Oral BID  . feeding supplement (GLUCERNA SHAKE)  237 mL Oral TID BM  . ferric gluconate (FERRLECIT/NULECIT) IV  125 mg Intravenous Q M,W,F-HD  . gabapentin  100 mg Oral TID  . insulin aspart  0-20 Units Subcutaneous TID WC  . insulin glargine  15 Units Subcutaneous QHS  . labetalol  200 mg Oral BID  . multivitamin  1 tablet Oral QHS  . piperacillin-tazobactam (ZOSYN)  IV  2.25 g Intravenous Q8H  . polyethylene glycol  17 g Oral Daily  . sevelamer carbonate  800 mg Oral TID WC  . sodium chloride  10-40 mL  Intracatheter Q12H

## 2013-06-22 DIAGNOSIS — R509 Fever, unspecified: Secondary | ICD-10-CM

## 2013-06-22 DIAGNOSIS — I96 Gangrene, not elsewhere classified: Secondary | ICD-10-CM

## 2013-06-22 DIAGNOSIS — E119 Type 2 diabetes mellitus without complications: Secondary | ICD-10-CM

## 2013-06-22 DIAGNOSIS — S88919A Complete traumatic amputation of unspecified lower leg, level unspecified, initial encounter: Secondary | ICD-10-CM

## 2013-06-22 LAB — RENAL FUNCTION PANEL
Albumin: 1.3 g/dL — ABNORMAL LOW (ref 3.5–5.2)
BUN: 29 mg/dL — ABNORMAL HIGH (ref 6–23)
CALCIUM: 7.2 mg/dL — AB (ref 8.4–10.5)
CO2: 25 mEq/L (ref 19–32)
Chloride: 94 mEq/L — ABNORMAL LOW (ref 96–112)
Creatinine, Ser: 6.45 mg/dL — ABNORMAL HIGH (ref 0.50–1.35)
GFR, EST AFRICAN AMERICAN: 11 mL/min — AB (ref >90)
GFR, EST NON AFRICAN AMERICAN: 10 mL/min — AB (ref >90)
GLUCOSE: 107 mg/dL — AB (ref 70–99)
POTASSIUM: 4 meq/L (ref 3.7–5.3)
Phosphorus: 6.8 mg/dL — ABNORMAL HIGH (ref 2.3–4.6)
SODIUM: 132 meq/L — AB (ref 137–147)

## 2013-06-22 LAB — CBC
HCT: 28.7 % — ABNORMAL LOW (ref 39.0–52.0)
Hemoglobin: 9.3 g/dL — ABNORMAL LOW (ref 13.0–17.0)
MCH: 29.2 pg (ref 26.0–34.0)
MCHC: 32.4 g/dL (ref 30.0–36.0)
MCV: 90 fL (ref 78.0–100.0)
Platelets: 295 10*3/uL (ref 150–400)
RBC: 3.19 MIL/uL — AB (ref 4.22–5.81)
RDW: 14.3 % (ref 11.5–15.5)
WBC: 14.2 10*3/uL — AB (ref 4.0–10.5)

## 2013-06-22 LAB — IRON AND TIBC
IRON: 23 ug/dL — AB (ref 42–135)
SATURATION RATIOS: 19 % — AB (ref 20–55)
TIBC: 118 ug/dL — ABNORMAL LOW (ref 215–435)
UIBC: 95 ug/dL — AB (ref 125–400)

## 2013-06-22 LAB — HEPARIN LEVEL (UNFRACTIONATED): HEPARIN UNFRACTIONATED: 0.23 [IU]/mL — AB (ref 0.30–0.70)

## 2013-06-22 LAB — GLUCOSE, CAPILLARY
Glucose-Capillary: 98 mg/dL (ref 70–99)
Glucose-Capillary: 103 mg/dL — ABNORMAL HIGH (ref 70–99)
Glucose-Capillary: 133 mg/dL — ABNORMAL HIGH (ref 70–99)
Glucose-Capillary: 101 mg/dL — ABNORMAL HIGH (ref 70–99)

## 2013-06-22 LAB — PROTIME-INR
INR: 1.77 — AB (ref 0.00–1.49)
Prothrombin Time: 20.1 seconds — ABNORMAL HIGH (ref 11.6–15.2)

## 2013-06-22 MED ORDER — OXYCODONE-ACETAMINOPHEN 5-325 MG PO TABS
ORAL_TABLET | ORAL | Status: AC
  Administered 2013-06-22: 1 via ORAL
  Filled 2013-06-22: qty 1

## 2013-06-22 MED ORDER — DARBEPOETIN ALFA-POLYSORBATE 100 MCG/0.5ML IJ SOLN
INTRAMUSCULAR | Status: AC
  Administered 2013-06-22: 100 ug
  Filled 2013-06-22: qty 0.5

## 2013-06-22 MED ORDER — PIPERACILLIN-TAZOBACTAM IN DEX 2-0.25 GM/50ML IV SOLN
2.2500 g | Freq: Three times a day (TID) | INTRAVENOUS | Status: DC
  Administered 2013-06-22 – 2013-06-28 (×17): 2.25 g via INTRAVENOUS
  Filled 2013-06-22 (×20): qty 50

## 2013-06-22 MED ORDER — WARFARIN SODIUM 4 MG PO TABS
4.0000 mg | ORAL_TABLET | Freq: Once | ORAL | Status: AC
Start: 2013-06-22 — End: 2013-06-22
  Administered 2013-06-22: 4 mg via ORAL
  Filled 2013-06-22: qty 1

## 2013-06-22 MED ORDER — DEXTROSE 5 % IV SOLN
2.0000 g | INTRAVENOUS | Status: DC
  Administered 2013-06-22: 2 g via INTRAVENOUS
  Filled 2013-06-22: qty 20

## 2013-06-22 MED ORDER — HEPARIN (PORCINE) IN NACL 100-0.45 UNIT/ML-% IJ SOLN
2650.0000 [IU]/h | INTRAMUSCULAR | Status: DC
  Administered 2013-06-22: 2500 [IU]/h via INTRAVENOUS
  Administered 2013-06-23: 2650 [IU]/h via INTRAVENOUS
  Filled 2013-06-22 (×7): qty 250

## 2013-06-22 MED ORDER — VANCOMYCIN HCL 10 G IV SOLR
2000.0000 mg | Freq: Once | INTRAVENOUS | Status: AC
  Administered 2013-06-22: 2000 mg via INTRAVENOUS
  Filled 2013-06-22: qty 2000

## 2013-06-22 MED ORDER — VANCOMYCIN HCL IN DEXTROSE 1-5 GM/200ML-% IV SOLN
1000.0000 mg | INTRAVENOUS | Status: DC
  Filled 2013-06-22 (×2): qty 200

## 2013-06-22 NOTE — Consult Note (Addendum)
[facility]  Date of Admission:  06/02/2013  Date of Consult:  06/22/2013  Reason for Consult: Gangrene RLE Referring Physician: Regelado  Impression/Recommendation Gangrene RLE  (MSSA R- clinda, PEN, erythro) Amputation 5-19 Fever ESRD DVT RUE DM2  Consider re-image his R stump Surgical f/u of stump Consider image his R hand Consider CT chest IJ line out when possible Change anbx back to vanco, zosyn for now  Comment He has multiple possible sources of fever. In the lead is his stump which is hot and very tender. ID will f/u over w/e.   Thank you so much for this interesting consult,   Campbell Riches (pager) [phone] [email or web address]  Steven Logan is an 43 y.o. male.  HPI: 43 yo M with DM, ESRD, comes to hospital with 2 weeks of drainage from his R ankle adm on 5-10 with gangrenous R leg (gas containing abscess and cellulitis). He was taken to OR and debrided on 5-10 (Cx MSSA). He was started on vanco/zosyn post-op. He returned to OR on 5-11 (Cx MSSA), 5-13 for further debridement.  He also noted RUE swelling for 2 days prior to arrival. This was found to be a DVT of jugular, subclavian, axillary and brachial veins.  His anbx were narrowed to zosyn alone on 5-17.  On 5-19 he underwent R transtibial amputation.  His course was notable for leukocytosis (up to 30k), fever in hospital (101, 5-17) (101.5 5-21) (101 5-21) ON 5-28 he developed temp 101.9 and shaking, slurred speech.   Past Medical History  Diagnosis Date  . Eczema   . Morbid obesity   . Gout   . HTN (hypertension)   . Dyslipidemia   . Pulmonary embolism     3  in  lungs  at  one time...  . Abscess     great toe  . Pancreatitis   . Arthritis   . Depression   . Diabetes mellitus   . Anemia   . Pneumonia     2-3 years ago  . Shortness of breath     ?? chest  cold he has now.  10/8- no SOB  . Renal hypertension     Hemo started  Sept 2014- MWF  .  Chronic kidney disease     esrd    Past Surgical History  Procedure Laterality Date  . None    . Av fistula placement Left 04/07/2012    Procedure: ARTERIOVENOUS (AV) FISTULA CREATION;  Surgeon: Angelia Mould, MD;  Location: [facility];  Service: Vascular;  Laterality: Left;  . Ligation of competing branches of arteriovenous fistula Left 09/19/2012    Procedure: LIGATION OF COMPETING BRANCHES OF LEFT ARM ARTERIOVENOUS FISTULA; Vein angioplasty;  Surgeon: Angelia Mould, MD;  Location: [facility];  Service: Vascular;  Laterality: Left;  . Insertion of dialysis catheter Right 09/19/2012    Procedure: INSERTION OF DIALYSIS CATHETER;  Surgeon: Angelia Mould, MD;  Location: [facility];  Service: Vascular;  Laterality: Right;  . I&d extremity Right 06/02/2013    Procedure: IRRIGATION AND DEBRIDEMENT ARTHROSCOPIC RIGHT ANKLE;  Surgeon: Augustin Schooling, MD;  Location: [facility];  Service: Orthopedics;  Laterality: Right;  . I&d extremity Right 06/04/2013    Procedure: IRRIGATION AND DEBRIDEMENT RIGHT FOOT/ANKLE;  Surgeon: Newt Minion, MD;  Location: [facility];  Service: Orthopedics;  Laterality: Right;  . I&d extremity Right 06/06/2013    Procedure: IRRIGATION AND DEBRIDEMENT EXTREMITY;  Surgeon: Newt Minion, MD;  Location: [facility];  Service: Orthopedics;  Laterality: Right;  . Amputation Right 06/12/2013    Procedure: Transtibial Amputation;  Surgeon: Newt Minion, MD;  Location: [facility];  Service: Orthopedics;  Laterality: Right;     No Known Allergies  Medications:  Scheduled: . ceFAZolin (ANCEF) 2 g IVPB  2 g Intravenous Q M,W,F-HD  . darbepoetin (ARANESP) injection - DIALYSIS  150 mcg Intravenous Q Fri-HD  . feeding supplement (RESOURCE BREEZE)  1 Container Oral BID BM  . ferric gluconate (FERRLECIT/NULECIT) IV  125 mg Intravenous Q M,W,F-HD  . gabapentin  100 mg Oral TID  . insulin aspart  0-20 Units Subcutaneous TID WC  . labetalol  200 mg Oral BID  . multivitamin  1 tablet Oral QHS  .  sevelamer carbonate  800 mg Oral TID WC  . sodium chloride  10-40 mL Intracatheter Q12H  . Warfarin - Pharmacist Dosing Inpatient   Does not apply q1800    Abtx:  Anti-infectives   Start     Dose/Rate Route Frequency Ordered Stop   06/22/13 1200  ceFAZolin (ANCEF) 2 g in dextrose 5 % 50 mL IVPB     2 g 100 mL/hr over 30 Minutes Intravenous Every M-W-F (Hemodialysis) 06/22/13 0900     06/05/13 0600  ceFAZolin (ANCEF) IVPB 2 g/50 mL premix  Status:  Discontinued     2 g 100 mL/hr over 30 Minutes Intravenous On call to O.R. 06/04/13 1301 06/04/13 1858   06/05/13 0000  vancomycin (VANCOCIN) 1,500 mg in sodium chloride 0.9 % 500 mL IVPB  Status:  Discontinued     1,500 mg 250 mL/hr over 120 Minutes Intravenous Every 48 hours 06/03/13 0239 06/03/13 0243   06/04/13 1200  vancomycin (VANCOCIN) IVPB 1000 mg/200 mL premix  Status:  Discontinued     1,000 mg 200 mL/hr over 60 Minutes Intravenous Every M-W-F (Hemodialysis) 06/03/13 0244 06/10/13 1415   06/03/13 0600  piperacillin-tazobactam (ZOSYN) IVPB 3.375 g  Status:  Discontinued     3.375 g 12.5 mL/hr over 240 Minutes Intravenous Every 8 hours 06/03/13 0239 06/03/13 0241   06/03/13 0600  piperacillin-tazobactam (ZOSYN) IVPB 2.25 g  Status:  Discontinued     2.25 g 100 mL/hr over 30 Minutes Intravenous Every 8 hours 06/03/13 0243 06/22/13 0813   06/03/13 0400  vancomycin (VANCOCIN) IVPB 1000 mg/200 mL premix     1,000 mg 200 mL/hr over 60 Minutes Intravenous  Once 06/03/13 0239 06/03/13 0551   06/02/13 2245  [MAR Hold]  vancomycin (VANCOCIN) 2,000 mg in sodium chloride 0.9 % 500 mL IVPB  Status:  Discontinued     (On MAR Hold since 06/02/13 2355)   2,000 mg 250 mL/hr over 120 Minutes Intravenous  Once 06/02/13 2235 06/03/13 0237   06/02/13 2245  piperacillin-tazobactam (ZOSYN) IVPB 3.375 g     3.375 g 100 mL/hr over 30 Minutes Intravenous  Once 06/02/13 2235 06/08/13 0817      Total days of antibiotics 19 (ancef)          Social  History:  reports that he has never smoked. He has never used smokeless tobacco. He reports that he does not drink alcohol or use illicit drugs.  Family History  Problem Relation Age of Onset  . Stroke Mother   . Diabetes Mother   . Hypertension Mother   . Cancer    . Coronary artery disease    . Hypertension Father   . Colon cancer Maternal Grandmother 22  . Hypertension  Brother     General ROS: loose BM, chest pain/anterior, no problems with fistual, pain in stump. normal urine this AM. c/o f/c. see HPI  Blood pressure 194/82, pulse 104, temperature 99.9 F (37.7 C), temperature source Oral, resp. rate 18, height _0  (1.854 m), weight 105.8 kg (233 lb 4 oz), SpO2 96.00%. General appearance: alert, cooperative and mild distress Eyes: negative findings: conjunctivae and sclerae normal and pupils equal, round, reactive to light and accomodation Throat: normal findings: oropharynx pink & moist without lesions or evidence of thrush and dental carries. no mandibular or maxillary tenderness Neck: no adenopathy, supple, symmetrical, trachea midline and L IJ Lungs: clear to auscultation bilaterally Chest wall: fullness around R clavicle Heart: regular rate and rhythm Abdomen: normal findings: bowel sounds normal and soft, non-tender Extremities: edema none LLE and no lesions L foot. L AVF is +bruit. R stump is hot, very tender. clear fluid draining from wound. RUE shows hand desquamating, tender.    Results for orders placed during the hospital encounter of 06/02/13 (from the past 48 hour(s))  GLUCOSE, CAPILLARY     Status: Abnormal   Collection Time    06/20/13 10:34 PM      Result Value Ref Range   Glucose-Capillary 145 (*) 70 - 99 mg/dL  BASIC METABOLIC PANEL     Status: Abnormal   Collection Time    06/21/13  6:30 AM      Result Value Ref Range   Sodium 133 (*) 137 - 147 mEq/L   Potassium 3.7  3.7 - 5.3 mEq/L   Chloride 95 (*) 96 - 112 mEq/L   CO2 26  19 - 32 mEq/L    Glucose, Bld 45 (*) 70 - 99 mg/dL   BUN 19  6 - 23 mg/dL   Creatinine, Ser 4.92 (*) 0.50 - 1.35 mg/dL   Calcium 7.3 (*) 8.4 - 10.5 mg/dL   GFR calc non Af Amer 13 (*) >90 mL/min   GFR calc Af Amer 15 (*) >90 mL/min   Comment: (NOTE)     The eGFR has been calculated using the CKD EPI equation.     This calculation has not been validated in all clinical situations.     eGFR's persistently <90 mL/min signify possible Chronic Kidney     Disease.  HEPARIN LEVEL (UNFRACTIONATED)     Status: Abnormal   Collection Time    06/21/13  6:30 AM      Result Value Ref Range   Heparin Unfractionated 0.28 (*) 0.30 - 0.70 IU/mL   Comment:            IF HEPARIN RESULTS ARE BELOW     EXPECTED VALUES, AND PATIENT     DOSAGE HAS BEEN CONFIRMED,     SUGGEST FOLLOW UP TESTING     OF ANTITHROMBIN III LEVELS.  CBC     Status: Abnormal   Collection Time    06/21/13  6:30 AM      Result Value Ref Range   WBC 15.4 (*) 4.0 - 10.5 K/uL   RBC 2.63 (*) 4.22 - 5.81 MIL/uL   Hemoglobin 7.5 (*) 13.0 - 17.0 g/dL   HCT 23.9 (*) 39.0 - 52.0 %   MCV 90.9  78.0 - 100.0 fL   MCH 28.5  26.0 - 34.0 pg   MCHC 31.4  30.0 - 36.0 g/dL   RDW 14.4  11.5 - 15.5 %   Platelets 318  150 - 400 K/uL  PROTIME-INR  Status: Abnormal   Collection Time    06/21/13  6:30 AM      Result Value Ref Range   Prothrombin Time 25.0 (*) 11.6 - 15.2 seconds   INR 2.36 (*) 0.00 - 1.49  GLUCOSE, CAPILLARY     Status: Abnormal   Collection Time    06/21/13  7:44 AM      Result Value Ref Range   Glucose-Capillary 50 (*) 70 - 99 mg/dL  GLUCOSE, CAPILLARY     Status: Abnormal   Collection Time    06/21/13  8:11 AM      Result Value Ref Range   Glucose-Capillary 52 (*) 70 - 99 mg/dL  GLUCOSE, CAPILLARY     Status: Abnormal   Collection Time    06/21/13  8:31 AM      Result Value Ref Range   Glucose-Capillary 69 (*) 70 - 99 mg/dL  GLUCOSE, CAPILLARY     Status: Abnormal   Collection Time    06/21/13  9:42 AM      Result Value Ref  Range   Glucose-Capillary 135 (*) 70 - 99 mg/dL  GLUCOSE, CAPILLARY     Status: None   Collection Time    06/21/13  1:04 PM      Result Value Ref Range   Glucose-Capillary 85  70 - 99 mg/dL  GLUCOSE, CAPILLARY     Status: None   Collection Time    06/21/13  5:27 PM      Result Value Ref Range   Glucose-Capillary 98  70 - 99 mg/dL  GLUCOSE, CAPILLARY     Status: None   Collection Time    06/21/13  7:45 PM      Result Value Ref Range   Glucose-Capillary 96  70 - 99 mg/dL  CBC     Status: Abnormal   Collection Time    06/22/13  5:00 AM      Result Value Ref Range   WBC 14.2 (*) 4.0 - 10.5 K/uL   RBC 3.19 (*) 4.22 - 5.81 MIL/uL   Hemoglobin 9.3 (*) 13.0 - 17.0 g/dL   Comment: REPEATED TO VERIFY   HCT 28.7 (*) 39.0 - 52.0 %   MCV 90.0  78.0 - 100.0 fL   MCH 29.2  26.0 - 34.0 pg   MCHC 32.4  30.0 - 36.0 g/dL   RDW 14.3  11.5 - 15.5 %   Platelets 295  150 - 400 K/uL  PROTIME-INR     Status: Abnormal   Collection Time    06/22/13  5:00 AM      Result Value Ref Range   Prothrombin Time 20.1 (*) 11.6 - 15.2 seconds   INR 1.77 (*) 0.00 - 1.49  RENAL FUNCTION PANEL     Status: Abnormal   Collection Time    06/22/13  5:00 AM      Result Value Ref Range   Sodium 132 (*) 137 - 147 mEq/L   Potassium 4.0  3.7 - 5.3 mEq/L   Chloride 94 (*) 96 - 112 mEq/L   CO2 25  19 - 32 mEq/L   Glucose, Bld 107 (*) 70 - 99 mg/dL   BUN 29 (*) 6 - 23 mg/dL   Creatinine, Ser 6.45 (*) 0.50 - 1.35 mg/dL   Calcium 7.2 (*) 8.4 - 10.5 mg/dL   Phosphorus 6.8 (*) 2.3 - 4.6 mg/dL   Albumin 1.3 (*) 3.5 - 5.2 g/dL   GFR calc non Af Amer 10 (*) >90  mL/min   GFR calc Af Amer 11 (*) >90 mL/min   Comment: (NOTE)     The eGFR has been calculated using the CKD EPI equation.     This calculation has not been validated in all clinical situations.     eGFR's persistently <90 mL/min signify possible Chronic Kidney     Disease.  GLUCOSE, CAPILLARY     Status: None   Collection Time    06/22/13  7:50 AM       Result Value Ref Range   Glucose-Capillary 98  70 - 99 mg/dL  IRON AND TIBC     Status: Abnormal   Collection Time    06/22/13  9:06 AM      Result Value Ref Range   Iron 23 (*) 42 - 135 ug/dL   TIBC 118 (*) 215 - 435 ug/dL   Saturation Ratios 19 (*) 20 - 55 %   UIBC 95 (*) 125 - 400 ug/dL   Comment: Performed at [place], CAPILLARY     Status: Abnormal   Collection Time    06/22/13  1:32 PM      Result Value Ref Range   Glucose-Capillary 103 (*) 70 - 99 mg/dL  GLUCOSE, CAPILLARY     Status: Abnormal   Collection Time    06/22/13  5:02 PM      Result Value Ref Range   Glucose-Capillary 133 (*) 70 - 99 mg/dL      Component Value Date/Time   SDES BLOOD HEMODIALYSIS FISTULA 06/18/2013 0935   SPECREQUEST BOTTLES DRAWN AEROBIC AND ANAEROBIC 10CC 06/18/2013 0935   CULT  Value:        BLOOD CULTURE RECEIVED NO GROWTH TO DATE CULTURE WILL BE HELD FOR 5 DAYS BEFORE ISSUING A FINAL NEGATIVE REPORT Performed at [facility] 06/18/2013 0935   REPTSTATUS PENDING 06/18/2013 0935   No results found. Recent Results (from the past 240 hour(s))  CLOSTRIDIUM DIFFICILE BY PCR     Status: None   Collection Time    06/17/13  1:09 PM      Result Value Ref Range Status   C difficile by pcr NEGATIVE  NEGATIVE Final  CULTURE, BLOOD (ROUTINE X 2)     Status: None   Collection Time    06/18/13  9:15 AM      Result Value Ref Range Status   Specimen Description BLOOD HEMODIALYSIS FISTULA   Final   Special Requests BOTTLES DRAWN AEROBIC AND ANAEROBIC 10CC   Final   Culture  Setup Time     Final   Value: 06/18/2013 16:16     Performed at [facility]   Culture     Final   Value:        BLOOD CULTURE RECEIVED NO GROWTH TO DATE CULTURE WILL BE HELD FOR 5 DAYS BEFORE ISSUING A FINAL NEGATIVE REPORT     Performed at [facility]   Report Status PENDING   Incomplete  CULTURE, BLOOD (ROUTINE X 2)     Status: None   Collection Time    06/18/13  9:35 AM       Result Value Ref Range Status   Specimen Description BLOOD HEMODIALYSIS FISTULA   Final   Special Requests BOTTLES DRAWN AEROBIC AND ANAEROBIC 10CC   Final   Culture  Setup Time     Final   Value: 06/18/2013 16:17     Performed at [facility]  Final   Value:        BLOOD CULTURE RECEIVED NO GROWTH TO DATE CULTURE WILL BE HELD FOR 5 DAYS BEFORE ISSUING A FINAL NEGATIVE REPORT     Performed at [facility]   Report Status PENDING   Incomplete  CLOSTRIDIUM DIFFICILE BY PCR     Status: None   Collection Time    06/20/13  5:48 PM      Result Value Ref Range Status   C difficile by pcr NEGATIVE  NEGATIVE Final      06/22/2013, 5:55 PM     LOS: 20 days     **Disclaimer: This note may have been dictated with voice recognition software. Similar sounding words can inadvertently be transcribed and this note may contain transcription errors which may not have been corrected upon publication of note.**

## 2013-06-22 NOTE — Consult Note (Signed)
[facility] for :  Cefazolin Indication:  MSSA leg wound infection  Hospital Problems Principal Problem:   Severe sepsis Active Problems:   HTN (hypertension)   Pulmonary embolism, bilateral   Abscess of right lower leg   Encephalopathy, toxic   Anemia in chronic kidney disease   Arm edema   Leg abscess   Septic shock   Septic arthritis of ankle or foot, right   ESRD on dialysis   Hyperglycemia   Arm DVT (deep venous thromboembolism), acute   Dosing Weight: 107.4 kg  Currently:  100.3 F (37.9 C) (Oral)  ,  Lab Results  Component Value Date   WBC 15.4* 06/21/2013    Labs:  Recent Labs  06/20/13 0749 06/21/13 0630  WBC 14.4* 15.4*  HGB 7.3* 7.5*  PLT 308 318  CREATININE 7.11* 4.92*    Estimated Creatinine Clearance: 24.9 ml/min (by C-G formula based on Cr of 4.92).   Microbiology: Recent Results (from the past 720 hour(s))  CULTURE, BLOOD (ROUTINE X 2)     Status: None   Collection Time    06/02/13 10:50 PM      Result Value Ref Range Status   Specimen Description BLOOD LEFT ARM   Final   Special Requests BOTTLES DRAWN AEROBIC AND ANAEROBIC 10CC EACH   Final   Culture  Setup Time     Final   Value: 06/03/2013 12:30     Performed at [facility]   Culture     Final   Value: NO GROWTH 5 DAYS     Performed at [facility]   Report Status 06/09/2013 FINAL   Final  CULTURE, BLOOD (ROUTINE X 2)     Status: None   Collection Time    06/02/13 10:55 PM      Result Value Ref Range Status   Specimen Description BLOOD LEFT HAND   Final   Special Requests BOTTLES DRAWN AEROBIC ONLY 10CC   Final   Culture  Setup Time     Final   Value: 06/03/2013 12:30     Performed at [facility]   Culture     Final   Value: NO GROWTH 5 DAYS     Performed at [facility]   Report Status 06/09/2013 FINAL   Final  CULTURE, ROUTINE-ABSCESS     Status: None   Collection Time    06/03/13 12:20 AM      Result  Value Ref Range Status   Specimen Description ABSCESS RIGHT FOOT   Final   Special Requests ANKLE PATIENT ON FOLLOWING ZOSYN VANCOMYCIN   Final   Gram Stain     Final   Value: ABUNDANT WBC PRESENT, PREDOMINANTLY PMN     RARE SQUAMOUS EPITHELIAL CELLS PRESENT     ABUNDANT GRAM POSITIVE COCCI IN PAIRS     IN CLUSTERS     Performed at [facility]   Culture     Final   Value: MODERATE STAPHYLOCOCCUS AUREUS     Note: RIFAMPIN AND GENTAMICIN SHOULD NOT BE USED AS SINGLE DRUGS FOR TREATMENT OF STAPH INFECTIONS. This organism is presumed to be Clindamycin resistant based on detection of inducible Clindamycin resistance.     Performed at [facility]   Report Status 06/05/2013 FINAL   Final   Organism ID, Bacteria STAPHYLOCOCCUS AUREUS   Final  ANAEROBIC CULTURE     Status: None   Collection Time    06/03/13 12:20 AM  Result Value Ref Range Status   Specimen Description ABSCESS RIGHT FOOT   Final   Special Requests ANKLE PATIENT ON FOLLOWING ZOSYN VANCOMYCIN   Final   Gram Stain     Final   Value: ABUNDANT WBC PRESENT, PREDOMINANTLY PMN     RARE SQUAMOUS EPITHELIAL CELLS PRESENT     ABUNDANT GRAM POSITIVE COCCI IN PAIRS     IN CLUSTERS     Performed at [facility]   Culture     Final   Value: NO ANAEROBES ISOLATED     Performed at [facility]   Report Status 06/08/2013 FINAL   Final  MRSA PCR SCREENING     Status: None   Collection Time    06/03/13  3:35 AM      Result Value Ref Range Status   MRSA by PCR NEGATIVE  NEGATIVE Final   Comment:            The GeneXpert MRSA Assay (FDA     approved for NASAL specimens     only), is one component of a     comprehensive MRSA colonization     surveillance program. It is not     intended to diagnose MRSA     infection nor to guide or     monitor treatment for     MRSA infections.  CULTURE, ROUTINE-ABSCESS     Status: None   Collection Time    06/04/13  5:59 PM      Result Value Ref Range Status    Specimen Description ABSCESS ANKLE RIGHT   Final   Special Requests PATIENT ON FOLLOWING VANCOMYCIN, UNASYN   Final   Gram Stain     Final   Value: ABUNDANT WBC PRESENT,BOTH PMN AND MONONUCLEAR     NO SQUAMOUS EPITHELIAL CELLS SEEN     FEW GRAM POSITIVE COCCI     IN PAIRS IN CLUSTERS     Performed at [facility]   Culture     Final   Value: MODERATE STAPHYLOCOCCUS AUREUS     Note: RIFAMPIN AND GENTAMICIN SHOULD NOT BE USED AS SINGLE DRUGS FOR TREATMENT OF STAPH INFECTIONS. This organism is presumed to be Clindamycin resistant based on detection of inducible Clindamycin resistance.     Performed at [facility]   Report Status 06/07/2013 FINAL   Final   Organism ID, Bacteria STAPHYLOCOCCUS AUREUS   Final  ANAEROBIC CULTURE     Status: None   Collection Time    06/04/13  5:59 PM      Result Value Ref Range Status   Specimen Description ABSCESS ANKLE RIGHT   Final   Special Requests PATIENT ON FOLLOWING VANCOMYCIN, UNASYN   Final   Gram Stain     Final   Value: ABUNDANT WBC PRESENT,BOTH PMN AND MONONUCLEAR     NO SQUAMOUS EPITHELIAL CELLS SEEN     FEW GRAM POSITIVE COCCI     IN PAIRS IN CLUSTERS     Performed at [facility]   Culture     Final   Value: NO ANAEROBES ISOLATED     Performed at [facility]   Report Status 06/09/2013 FINAL   Final  ANAEROBIC CULTURE     Status: None   Collection Time    06/04/13  6:07 PM      Result Value Ref Range Status   Specimen Description ABSCESS RIGHT LEG   Final   Special Requests  Final   Value: PATIENT ON FOLLOWING VANCOMYCIN UNASYN CULTURES FROM RIGHT CALF   Gram Stain     Final   Value: FEW WBC PRESENT,BOTH PMN AND MONONUCLEAR     NO SQUAMOUS EPITHELIAL CELLS SEEN     FEW GRAM POSITIVE COCCI     IN PAIRS     Performed at [facility]   Culture     Final   Value: NO ANAEROBES ISOLATED     Performed at [facility]   Report Status 06/09/2013 FINAL   Final  CULTURE, ROUTINE-ABSCESS      Status: None   Collection Time    06/04/13  6:07 PM      Result Value Ref Range Status   Specimen Description ABSCESS RIGHT LEG   Final   Special Requests     Final   Value: PATIENT ON FOLLOWING VANCOMYCIN UNASYN CULTURES FROM RIGHT CALF   Gram Stain     Final   Value: RARE WBC PRESENT,BOTH PMN AND MONONUCLEAR     NO SQUAMOUS EPITHELIAL CELLS SEEN     FEW GRAM POSITIVE COCCI     IN PAIRS     Performed at [facility]   Culture     Final   Value: MODERATE STAPHYLOCOCCUS AUREUS     Note: SUSCEPTIBILITIES PERFORMED ON PREVIOUS CULTURE WITHIN THE LAST 5 DAYS.     Performed at [facility]   Report Status 06/07/2013 FINAL   Final  CULTURE, BLOOD (ROUTINE X 2)     Status: None   Collection Time    06/10/13  6:29 AM      Result Value Ref Range Status   Specimen Description BLOOD   Final   Special Requests     Final   Value: BOTTLES DRAWN AEROBIC ONLY LEFT INTERNAL JUGULAR TLC DISTAL PORT  10CC   Culture  Setup Time     Final   Value: 06/10/2013 14:40     Performed at [facility]   Culture     Final   Value: NO GROWTH 5 DAYS     Performed at [facility]   Report Status 06/15/2013 FINAL   Final  CLOSTRIDIUM DIFFICILE BY PCR     Status: None   Collection Time    06/17/13  1:09 PM      Result Value Ref Range Status   C difficile by pcr NEGATIVE  NEGATIVE Final  CULTURE, BLOOD (ROUTINE X 2)     Status: None   Collection Time    06/18/13  9:15 AM      Result Value Ref Range Status   Specimen Description BLOOD HEMODIALYSIS FISTULA   Final   Special Requests BOTTLES DRAWN AEROBIC AND ANAEROBIC 10CC   Final   Culture  Setup Time     Final   Value: 06/18/2013 16:16     Performed at [facility]   Culture     Final   Value:        BLOOD CULTURE RECEIVED NO GROWTH TO DATE CULTURE WILL BE HELD FOR 5 DAYS BEFORE ISSUING A FINAL NEGATIVE REPORT     Performed at [facility]   Report Status PENDING   Incomplete  CULTURE, BLOOD (ROUTINE X  2)     Status: None   Collection Time    06/18/13  9:35 AM      Result Value Ref Range Status   Specimen Description BLOOD HEMODIALYSIS FISTULA   Final  Special Requests BOTTLES DRAWN AEROBIC AND ANAEROBIC 10CC   Final   Culture  Setup Time     Final   Value: 06/18/2013 16:17     Performed at [facility]   Culture     Final   Value:        BLOOD CULTURE RECEIVED NO GROWTH TO DATE CULTURE WILL BE HELD FOR 5 DAYS BEFORE ISSUING A FINAL NEGATIVE REPORT     Performed at [facility]   Report Status PENDING   Incomplete  CLOSTRIDIUM DIFFICILE BY PCR     Status: None   Collection Time    06/20/13  5:48 PM      Result Value Ref Range Status   C difficile by pcr NEGATIVE  NEGATIVE Final    Current Medication[s] Include:  Scheduled:  Scheduled:  . darbepoetin (ARANESP) injection - DIALYSIS  150 mcg Intravenous Q Fri-HD  . feeding supplement (RESOURCE BREEZE)  1 Container Oral BID BM  . ferric gluconate (FERRLECIT/NULECIT) IV  125 mg Intravenous Q M,W,F-HD  . gabapentin  100 mg Oral TID  . insulin aspart  0-20 Units Subcutaneous TID WC  . labetalol  200 mg Oral BID  . multivitamin  1 tablet Oral QHS  . sevelamer carbonate  800 mg Oral TID WC  . sodium chloride  10-40 mL Intracatheter Q12H  . warfarin  2 mg Oral ONCE-1800  . Warfarin - Pharmacist Dosing Inpatient   Does not apply q1800    Infusion[s]: Infusions:  . sodium chloride 10 mL/hr (06/18/13 1704)    Antibiotic[s]: Anti-infectives   Start     Dose/Rate Route Frequency Ordered Stop   06/05/13 0600  ceFAZolin (ANCEF) IVPB 2 g/50 mL premix  Status:  Discontinued     2 g 100 mL/hr over 30 Minutes Intravenous On call to O.R. 06/04/13 1301 06/04/13 1858   06/05/13 0000  vancomycin (VANCOCIN) 1,500 mg in sodium chloride 0.9 % 500 mL IVPB  Status:  Discontinued     1,500 mg 250 mL/hr over 120 Minutes Intravenous Every 48 hours 06/03/13 0239 06/03/13 0243   06/04/13 1200  vancomycin (VANCOCIN) IVPB 1000 mg/200  mL premix  Status:  Discontinued     1,000 mg 200 mL/hr over 60 Minutes Intravenous Every M-W-F (Hemodialysis) 06/03/13 0244 06/10/13 1415   06/03/13 0600  piperacillin-tazobactam (ZOSYN) IVPB 3.375 g  Status:  Discontinued     3.375 g 12.5 mL/hr over 240 Minutes Intravenous Every 8 hours 06/03/13 0239 06/03/13 0241   06/03/13 0600  piperacillin-tazobactam (ZOSYN) IVPB 2.25 g  Status:  Discontinued     2.25 g 100 mL/hr over 30 Minutes Intravenous Every 8 hours 06/03/13 0243 06/22/13 0813   06/03/13 0400  vancomycin (VANCOCIN) IVPB 1000 mg/200 mL premix     1,000 mg 200 mL/hr over 60 Minutes Intravenous  Once 06/03/13 0239 06/03/13 0551   06/02/13 2245  [MAR Hold]  vancomycin (VANCOCIN) 2,000 mg in sodium chloride 0.9 % 500 mL IVPB  Status:  Discontinued     (On MAR Hold since 06/02/13 2355)   2,000 mg 250 mL/hr over 120 Minutes Intravenous  Once 06/02/13 2235 06/03/13 0237   06/02/13 2245  piperacillin-tazobactam (ZOSYN) IVPB 3.375 g     3.375 g 100 mL/hr over 30 Minutes Intravenous  Once 06/02/13 2235 06/08/13 0817      Assessment:  43 y/o male on Day 21 of Zosyn, s/p Vancomycin x 7 days for gangrenous wound infection.  On 5/11 repeat I &  D reveals massive necrotizing fasciitis and purulent abscess of leg.  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    Leg wound cultures reported as MSSA.  Zosyn to be changed to Cefazolin per Pharmacy consult.  Patient currently febrile  100.3 F, last WBC rising to 15.4.  HD scheduled every MWF.  Currently in HD session.  Goal of Therapy:  Cefazolin selected for MSSA infection/cultures and adjusted for renal function.  Monitor renal function, WBC, fever curve, any cultures/sensitivities, and clinical progression.  Plan:  1. Begin Cefazolin 2 gm IV q HD on MWF.  First dose after HD today. Monitor renal function, WBC, fever curve, any cultures/sensitivities, and clinical progression.  Estelle June, Pharm.D.  06/22/2013 8:47 AM

## 2013-06-22 NOTE — Progress Notes (Signed)
ANTIBIOTIC CONSULT NOTE - INITIAL  Pharmacy Consult for vancomycin and Zosyn Indication: gangrene RLE  No Known Allergies  Patient Measurements: Height: 6\' 1"  (185.4 cm) Weight: 233 lb 4 oz (105.8 kg) IBW/kg (Calculated) : 79.9 Adjusted Body Weight: 90kg  Vital Signs: Temp: 102.9 F (39.4 C) (05/29 1715) Temp src: Oral (05/29 1715) BP: 189/90 mmHg (05/29 1715) Pulse Rate: 116 (05/29 1715) Intake/Output from previous day: 05/28 0701 - 05/29 0700 In: 840 [P.O.:840] Out: -  Intake/Output from this shift: Total I/O In: 360 [P.O.:360] Out: 4000 [Other:4000]  Labs:  Recent Labs  06/20/13 0749 06/21/13 0630 06/22/13 0500  WBC 14.4* 15.4* 14.2*  HGB 7.3* 7.5* 9.3*  PLT 308 318 295  CREATININE 7.11* 4.92* 6.45*   Estimated Creatinine Clearance: 18.9 ml/min (by C-G formula based on Cr of 6.45). No results found for this basename: VANCOTROUGH, VANCOPEAK, VANCORANDOM, GENTTROUGH, GENTPEAK, GENTRANDOM, TOBRATROUGH, TOBRAPEAK, TOBRARND, AMIKACINPEAK, AMIKACINTROU, AMIKACIN,  in the last 72 hours   Microbiology: Recent Results (from the past 720 hour(s))  CULTURE, BLOOD (ROUTINE X 2)     Status: None   Collection Time    06/02/13 10:50 PM      Result Value Ref Range Status   Specimen Description BLOOD LEFT ARM   Final   Special Requests BOTTLES DRAWN AEROBIC AND ANAEROBIC 10CC EACH   Final   Culture  Setup Time     Final   Value: 06/03/2013 12:30     Performed at [facility]   Culture     Final   Value: NO GROWTH 5 DAYS     Performed at [facility]   Report Status 06/09/2013 FINAL   Final  CULTURE, BLOOD (ROUTINE X 2)     Status: None   Collection Time    06/02/13 10:55 PM      Result Value Ref Range Status   Specimen Description BLOOD LEFT HAND   Final   Special Requests BOTTLES DRAWN AEROBIC ONLY 10CC   Final   Culture  Setup Time     Final   Value: 06/03/2013 12:30     Performed at [facility]   Culture     Final   Value: NO GROWTH 5  DAYS     Performed at [facility]   Report Status 06/09/2013 FINAL   Final  CULTURE, ROUTINE-ABSCESS     Status: None   Collection Time    06/03/13 12:20 AM      Result Value Ref Range Status   Specimen Description ABSCESS RIGHT FOOT   Final   Special Requests ANKLE PATIENT ON FOLLOWING ZOSYN VANCOMYCIN   Final   Gram Stain     Final   Value: ABUNDANT WBC PRESENT, PREDOMINANTLY PMN     RARE SQUAMOUS EPITHELIAL CELLS PRESENT     ABUNDANT GRAM POSITIVE COCCI IN PAIRS     IN CLUSTERS     Performed at [facility]   Culture     Final   Value: MODERATE STAPHYLOCOCCUS AUREUS     Note: RIFAMPIN AND GENTAMICIN SHOULD NOT BE USED AS SINGLE DRUGS FOR TREATMENT OF STAPH INFECTIONS. This organism is presumed to be Clindamycin resistant based on detection of inducible Clindamycin resistance.     Performed at [facility]   Report Status 06/05/2013 FINAL   Final   Organism ID, Bacteria STAPHYLOCOCCUS AUREUS   Final  ANAEROBIC CULTURE     Status: None   Collection Time    06/03/13 12:20  AM      Result Value Ref Range Status   Specimen Description ABSCESS RIGHT FOOT   Final   Special Requests ANKLE PATIENT ON FOLLOWING ZOSYN VANCOMYCIN   Final   Gram Stain     Final   Value: ABUNDANT WBC PRESENT, PREDOMINANTLY PMN     RARE SQUAMOUS EPITHELIAL CELLS PRESENT     ABUNDANT GRAM POSITIVE COCCI IN PAIRS     IN CLUSTERS     Performed at [facility]   Culture     Final   Value: NO ANAEROBES ISOLATED     Performed at [facility]   Report Status 06/08/2013 FINAL   Final  MRSA PCR SCREENING     Status: None   Collection Time    06/03/13  3:35 AM      Result Value Ref Range Status   MRSA by PCR NEGATIVE  NEGATIVE Final   Comment:            The GeneXpert MRSA Assay (FDA     approved for NASAL specimens     only), is one component of a     comprehensive MRSA colonization     surveillance program. It is not     intended to diagnose MRSA     infection nor  to guide or     monitor treatment for     MRSA infections.  CULTURE, ROUTINE-ABSCESS     Status: None   Collection Time    06/04/13  5:59 PM      Result Value Ref Range Status   Specimen Description ABSCESS ANKLE RIGHT   Final   Special Requests PATIENT ON FOLLOWING VANCOMYCIN, UNASYN   Final   Gram Stain     Final   Value: ABUNDANT WBC PRESENT,BOTH PMN AND MONONUCLEAR     NO SQUAMOUS EPITHELIAL CELLS SEEN     FEW GRAM POSITIVE COCCI     IN PAIRS IN CLUSTERS     Performed at [facility]   Culture     Final   Value: MODERATE STAPHYLOCOCCUS AUREUS     Note: RIFAMPIN AND GENTAMICIN SHOULD NOT BE USED AS SINGLE DRUGS FOR TREATMENT OF STAPH INFECTIONS. This organism is presumed to be Clindamycin resistant based on detection of inducible Clindamycin resistance.     Performed at [facility]   Report Status 06/07/2013 FINAL   Final   Organism ID, Bacteria STAPHYLOCOCCUS AUREUS   Final  ANAEROBIC CULTURE     Status: None   Collection Time    06/04/13  5:59 PM      Result Value Ref Range Status   Specimen Description ABSCESS ANKLE RIGHT   Final   Special Requests PATIENT ON FOLLOWING VANCOMYCIN, UNASYN   Final   Gram Stain     Final   Value: ABUNDANT WBC PRESENT,BOTH PMN AND MONONUCLEAR     NO SQUAMOUS EPITHELIAL CELLS SEEN     FEW GRAM POSITIVE COCCI     IN PAIRS IN CLUSTERS     Performed at [facility]   Culture     Final   Value: NO ANAEROBES ISOLATED     Performed at [facility]   Report Status 06/09/2013 FINAL   Final  ANAEROBIC CULTURE     Status: None   Collection Time    06/04/13  6:07 PM      Result Value Ref Range Status   Specimen Description ABSCESS RIGHT LEG   Final  Special Requests     Final   Value: PATIENT ON FOLLOWING VANCOMYCIN UNASYN CULTURES FROM RIGHT CALF   Gram Stain     Final   Value: FEW WBC PRESENT,BOTH PMN AND MONONUCLEAR     NO SQUAMOUS EPITHELIAL CELLS SEEN     FEW GRAM POSITIVE COCCI     IN PAIRS     Performed  at [facility]   Culture     Final   Value: NO ANAEROBES ISOLATED     Performed at [facility]   Report Status 06/09/2013 FINAL   Final  CULTURE, ROUTINE-ABSCESS     Status: None   Collection Time    06/04/13  6:07 PM      Result Value Ref Range Status   Specimen Description ABSCESS RIGHT LEG   Final   Special Requests     Final   Value: PATIENT ON FOLLOWING VANCOMYCIN UNASYN CULTURES FROM RIGHT CALF   Gram Stain     Final   Value: RARE WBC PRESENT,BOTH PMN AND MONONUCLEAR     NO SQUAMOUS EPITHELIAL CELLS SEEN     FEW GRAM POSITIVE COCCI     IN PAIRS     Performed at [facility]   Culture     Final   Value: MODERATE STAPHYLOCOCCUS AUREUS     Note: SUSCEPTIBILITIES PERFORMED ON PREVIOUS CULTURE WITHIN THE LAST 5 DAYS.     Performed at [facility]   Report Status 06/07/2013 FINAL   Final  CULTURE, BLOOD (ROUTINE X 2)     Status: None   Collection Time    06/10/13  6:29 AM      Result Value Ref Range Status   Specimen Description BLOOD   Final   Special Requests     Final   Value: BOTTLES DRAWN AEROBIC ONLY LEFT INTERNAL JUGULAR TLC DISTAL PORT  10CC   Culture  Setup Time     Final   Value: 06/10/2013 14:40     Performed at [facility]   Culture     Final   Value: NO GROWTH 5 DAYS     Performed at [facility]   Report Status 06/15/2013 FINAL   Final  CLOSTRIDIUM DIFFICILE BY PCR     Status: None   Collection Time    06/17/13  1:09 PM      Result Value Ref Range Status   C difficile by pcr NEGATIVE  NEGATIVE Final  CULTURE, BLOOD (ROUTINE X 2)     Status: None   Collection Time    06/18/13  9:15 AM      Result Value Ref Range Status   Specimen Description BLOOD HEMODIALYSIS FISTULA   Final   Special Requests BOTTLES DRAWN AEROBIC AND ANAEROBIC 10CC   Final   Culture  Setup Time     Final   Value: 06/18/2013 16:16     Performed at [facility]   Culture     Final   Value:        BLOOD CULTURE RECEIVED NO  GROWTH TO DATE CULTURE WILL BE HELD FOR 5 DAYS BEFORE ISSUING A FINAL NEGATIVE REPORT     Performed at [facility]   Report Status PENDING   Incomplete  CULTURE, BLOOD (ROUTINE X 2)     Status: None   Collection Time    06/18/13  9:35 AM      Result Value Ref Range Status   Specimen Description BLOOD  HEMODIALYSIS FISTULA   Final   Special Requests BOTTLES DRAWN AEROBIC AND ANAEROBIC 10CC   Final   Culture  Setup Time     Final   Value: 06/18/2013 16:17     Performed at [facility]   Culture     Final   Value:        BLOOD CULTURE RECEIVED NO GROWTH TO DATE CULTURE WILL BE HELD FOR 5 DAYS BEFORE ISSUING A FINAL NEGATIVE REPORT     Performed at [facility]   Report Status PENDING   Incomplete  CLOSTRIDIUM DIFFICILE BY PCR     Status: None   Collection Time    06/20/13  5:48 PM      Result Value Ref Range Status   C difficile by pcr NEGATIVE  NEGATIVE Final   Assessment: 84 YOM with ESRD on HD with septic ankle and abscess on RLE s/p amputation with persistent fever. ID changing antibiotics from cefazolin back to vancomycin and Zosyn as he has multiple possible sources for fever. Last HD session was today- tolerated a full 4 hour session with BFR 448mL/min. WBC 14.2, Tmax/24h 102.9.  Goal of Therapy:  Pre-HD vancomycin level 15-75mcg/mL  Plan:  1. Zosyn 2.25g IV q8h 2. Vancomycin 2000mg  (20mg /kg) IV x1 as loading dose 3. Vancomycin 1000mg  IV qHD (MWF) 4. Follow new c/s, workup for sources of fever, de-escalation when able, HD schedule, and levels and needed  Darshawn Boateng D. Tykee Heideman, PharmD, BCPS Clinical Pharmacist Pager: [phone] 06/22/2013 6:46 PM

## 2013-06-22 NOTE — Progress Notes (Signed)
Patient ID: Steven Logan, male   DOB: 12-31-1970, 43 y.o.   MRN: [id number] [facility] PROGRESS NOTE  Steven Logan [id number] DOB: 1970-06-11 DOA: 06/02/2013 PCP: Tivis Ringer, MD  Brief narrative: 43 year old male with past medical history of morbid obesity, gout, HTN, DLD, PE, Pancreatitis, DM who presented to [facility] ED 06/02/2013 with worsening right ankle swelling for 2 weeks prior to this admission. Patient was started on prednisone for gout treatment initially which did not help. His wound opened up and started to have drainage. Patient subsequently re-presented to the ER. An MRI of the ankle revealed gas-containing abscess and cellulitis. Orthopedics took him emergently to the OR for debridement. He is status post right lower extremity transtibial amputation and remains on IV Zosyn. He was diagnosed with acute nonocclusive DVT right upper extremity and is on heparin bridging and Coumadin.   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 Will change antibiotics to Ancef.   Will inform Dr Sharol Given of persistent fever.   Fever; C diff negative. , chest x ray atelectasis... Blood culture 5-25 no growth to date. Afebrile. Change antibiotics to Ancef. Will ask ID for evaluation.   Diarrhea; better after stopping laxatives.   Acute nonocclusive DVT RUE   jugular, subclavian, axillary, and brachial veins involved   Has had recent removal of HD cath from right IJ 5/10 (which has been in place since Aug 2014)   On  heparin for anticoagulation; INR monitored by pharmacy as well  Discussed with Dr Alvy Bimler, she doesn't think that hyperpigmentation is related to coumadin.   Will resume coumadin. He will need long term anticoagulation.    Anemia in chronic kidney disease / acute  postoperative blood loss anemia   Has received 2 units PRBC blood transfusion 06/18/2013.  Hb 7.3  Encephalopathy, toxic  -     Improved. Speech clear.   Mental status waxing and waining -worse on HD days   End stage renal disease on HD M/W/F   Per renal management   Hypertension   Stable, BP 155/83  DM / Hyperglycemia   A1c 7.0; good glycemic control   Hypoglycemia. Will stop lantus.   Obesity - morbid  Body mass index is 28.69  HPTH  Continue Calcitrol and renvela   Diarrhea; will stop laxatives.   Code Status: full code Family Communication: no family at the bedside  Disposition Plan: pending clinical improvement.      Consultants:   Ortho   Nephro   Procedures:   I and D on 06/03/2013   IRRIGATION AND DEBRIDEMENT EXTREMITY extensive excision of muscle fascia and soft tissue right leg 06/06/13   Transtibial amputation 5/19   Antibiotics:   Vanc 5/9 >5/17   Zosyn 5/9 >      HPI/Subjective: Feeling ok, diarrhea improving.   Objective: Filed Vitals:   06/21/13 1729 06/21/13 1943 06/21/13 2139 06/22/13 0444  BP: 158/84 164/77  185/76  Pulse: 90 94  102  Temp: 99.8 F (37.7 C) 99.8 F (37.7 C) 101.9 F (38.8 C) 99.7 F (37.6 C)  TempSrc: Oral  Oral   Resp: 16 18  18   Height:      Weight:  107.412 kg (236 lb 12.8 oz)    SpO2: 100% 100%  100%  Intake/Output Summary (Last 24 hours) at 06/22/13 0818 Last data filed at 06/21/13 1851  Gross per 24 hour  Intake    840 ml  Output      0 ml  Net    840 ml    Exam:   General:  Pt is alert, not in acute distress  Cardiovascular: Regular rate and rhythm, S1/S2 appreciated   Respiratory: Clear to auscultation bilaterally, no wheezing  Abdomen: Soft, non tender, non distended, bowel sounds present  Extremities: right BKA, pulses DP and PT palpable bilaterally; Left AV fistula   Neuro: Grossly nonfocal  Data Reviewed: Basic Metabolic Panel:  Recent Labs Lab 06/17/13 0653  06/18/13 0630 06/19/13 0555 06/20/13 0749 06/21/13 0630  NA 134* 134* 136* 135* 133*  K 4.4 4.5 3.9 4.2 3.7  CL 94* 94* 97 96 95*  CO2 25 24 28 25 26   GLUCOSE 75 93 55* 66* 45*  BUN 36* 44* 23 34* 19  CREATININE 7.49* 8.98* 5.49* 7.11* 4.92*  CALCIUM 6.9* 6.8* 7.4* 7.0* 7.3*   Liver Function Tests: No results found for this basename: AST, ALT, ALKPHOS, BILITOT, PROT, ALBUMIN,  in the last 168 hours No results found for this basename: LIPASE, AMYLASE,  in the last 168 hours No results found for this basename: AMMONIA,  in the last 168 hours CBC:  Recent Labs Lab 06/18/13 0630 06/18/13 1025 06/19/13 0555 06/20/13 0749 06/21/13 0630  WBC 15.6* 14.5* 15.5* 14.4* 15.4*  HGB 6.7* 7.3* 8.2* 7.3* 7.5*  HCT 19.8* 21.8* 25.3* 22.6* 23.9*  MCV 90.0 88.3 90.0 89.7 90.9  PLT 330 274 330 308 318   Cardiac Enzymes: No results found for this basename: CKTOTAL, CKMB, CKMBINDEX, TROPONINI,  in the last 168 hours BNP: No components found with this basename: POCBNP,  CBG:  Recent Labs Lab 06/21/13 0831 06/21/13 0942 06/21/13 1304 06/21/13 1727 06/21/13 1945  GLUCAP 69* 135* 85 98 96    Recent Results (from the past 240 hour(s))  CLOSTRIDIUM DIFFICILE BY PCR     Status: None   Collection Time    06/17/13  1:09 PM      Result Value Ref Range Status   C difficile by pcr NEGATIVE  NEGATIVE Final  CULTURE, BLOOD (ROUTINE X 2)     Status: None   Collection Time    06/18/13  9:15 AM      Result Value Ref Range Status   Specimen Description BLOOD HEMODIALYSIS FISTULA   Final   Special Requests BOTTLES DRAWN AEROBIC AND ANAEROBIC 10CC   Final   Culture  Setup Time     Final   Value: 06/18/2013 16:16     Performed at [facility]   Culture     Final   Value:        BLOOD CULTURE RECEIVED NO GROWTH TO DATE CULTURE WILL BE HELD FOR 5 DAYS BEFORE ISSUING A FINAL NEGATIVE REPORT     Performed at [facility]   Report Status PENDING   Incomplete  CULTURE, BLOOD  (ROUTINE X 2)     Status: None   Collection Time    06/18/13  9:35 AM      Result Value Ref Range Status   Specimen Description BLOOD HEMODIALYSIS FISTULA   Final   Special Requests BOTTLES DRAWN AEROBIC AND ANAEROBIC 10CC   Final   Culture  Setup Time     Final   Value: 06/18/2013 16:17     Performed at [facility]  Culture     Final   Value:        BLOOD CULTURE RECEIVED NO GROWTH TO DATE CULTURE WILL BE HELD FOR 5 DAYS BEFORE ISSUING A FINAL NEGATIVE REPORT     Performed at [facility]   Report Status PENDING   Incomplete  CLOSTRIDIUM DIFFICILE BY PCR     Status: None   Collection Time    06/20/13  5:48 PM      Result Value Ref Range Status   C difficile by pcr NEGATIVE  NEGATIVE Final     Studies: Dg Chest 2 View  06/20/2013   CLINICAL DATA:  Fever.  EXAM: CHEST - 2 VIEW  COMPARISON:  06/18/2013  FINDINGS: There is stable positioning of a left jugular central line with the catheter tip at the brachiocephalic venous confluence. There is some residual scarring and atelectasis at the left lung base. Lung volumes are low. No overt edema or airspace consolidation is seen. No evidence of pleural effusion. The heart is mildly enlarged and stable in appearance. The bony thorax shows stable degenerative changes of the thoracic spine.  IMPRESSION: Scarring/atelectasis at the left lung base.   Electronically Signed   By: Aletta Edouard M.D.   On: 06/20/2013 14:24    Scheduled Meds: . darbepoetin (ARANESP) injection - DIALYSIS  150 mcg Intravenous Q Fri-HD  . feeding supplement (RESOURCE BREEZE)  1 Container Oral BID BM  . ferric gluconate (FERRLECIT/NULECIT) IV  125 mg Intravenous Q M,W,F-HD  . gabapentin  100 mg Oral TID  . insulin aspart  0-20 Units Subcutaneous TID WC  . labetalol  200 mg Oral BID  . multivitamin  1 tablet Oral QHS  . sevelamer carbonate  800 mg Oral TID WC  . sodium chloride  10-40 mL Intracatheter Q12H  . warfarin  2 mg Oral ONCE-1800  . Warfarin  - Pharmacist Dosing Inpatient   Does not apply q1800   Continuous Infusions: . sodium chloride 10 mL/hr (06/18/13 1704)   DVT prophylaxis:  IV heparin   Elmarie Shiley, MD  [facility] Pager [phone]  If 7PM-7AM, please contact night-coverage [email or web address] Password [email or web address] 06/22/2013, 8:18 AM   LOS: 20 days

## 2013-06-22 NOTE — Procedures (Signed)
Patient was seen on dialysis and the procedure was supervised. BFR 400 Via LAVF BP is 147/88.  Patient appears to be tolerating treatment well

## 2013-06-22 NOTE — Progress Notes (Signed)
Occupational Therapy Note  Pt in dialysis. Will try back another time. Pauline Aus OTR/L [id number] 06/22/2013

## 2013-06-22 NOTE — Progress Notes (Addendum)
ANTICOAGULATION CONSULT NOTE - Follow Up Consult  Pharmacy Consult  :  Coumadin Indication  :  Acute non-occlusive RUE DVT, Hx PE  Heparin Dosing Weight: 99 kg   Recent Labs  06/20/13 0749 06/21/13 0630 06/22/13 0500  HGB 7.3* 7.5* 9.3*  HCT 22.6* 23.9* 28.7*  PLT 308 318 295  LABPROT 28.3* 25.0* 20.1*  INR 2.77* 2.36* 1.77*  HEPARINUNFRC 0.40 0.28*  --   CREATININE 7.11* 4.92* 6.45*     Medications: Scheduled:  . ceFAZolin (ANCEF) 2 g IVPB  2 g Intravenous Q M,W,F-HD  . darbepoetin (ARANESP) injection - DIALYSIS  150 mcg Intravenous Q Fri-HD  . feeding supplement (RESOURCE BREEZE)  1 Container Oral BID BM  . ferric gluconate (FERRLECIT/NULECIT) IV  125 mg Intravenous Q M,W,F-HD  . gabapentin  100 mg Oral TID  . insulin aspart  0-20 Units Subcutaneous TID WC  . labetalol  200 mg Oral BID  . multivitamin  1 tablet Oral QHS  . sevelamer carbonate  800 mg Oral TID WC  . sodium chloride  10-40 mL Intracatheter Q12H  . warfarin  2 mg Oral Ordered for 5/28,  Dose NOT Given  . Warfarin - Pharmacist Dosing Inpatient   Does not apply q1800    Infusions:  . sodium chloride 10 mL/hr (06/18/13 1704)    Assessment:  43 y/o male on Coumadin for new non-occlusive RUE DVT and history of PE.  Patient has been on Heparin bridging with INR therapeutic for 3 days.  Heparin was stopped yesterday and Coumadin 2 mg ordered for last evening.  Noted on MAR, patient did NOT receive Coumadin as ordered.  INR reflects missed Coumadin dose today.  INR 1.77.  No evidence of bleeding complications noted   MD to assess need for restart of Heparin bridging until INR therapeutic.  Goal:  INR GOAL 2 - 3   Plan: 1. Coumadin 4 mg po now. 2. Follow up with Physician regarding need for Heparin bridging until INR > 2. 3. Daily INR's, Platelet count, CBC.  Monitor for bleeding complications.    Chinonso Linker, Craig Guess,  Pharm.D  06/22/2013, 11:24 AM   ADDENDUM:  06/22/2013, 12:20 PM    ASSESSMENT:    With missed Coumadin dose, INR now subtherapeutic. Per Discussion with Dr. Tyrell Antonio, will restart Heparin bridging to cover patient until INR therapeutic. Last Heparin rate 2550 units/hr with therapeutic Heparin levels.  PLAN:  1. Restart Heparin infusion, no bolus, at 2500 units/hr. 2. Will check Heparin level in 8 hours. 3. Daily Heparin Levels, INR, Platelet counts, CBC.  Monitor for bleeding complications.  Marthenia Rolling,  Pharm.D. ,  06/22/2013,  12:26 PM

## 2013-06-23 ENCOUNTER — Inpatient Hospital Stay (HOSPITAL_COMMUNITY): Payer: BC Managed Care – PPO

## 2013-06-23 DIAGNOSIS — D72829 Elevated white blood cell count, unspecified: Secondary | ICD-10-CM

## 2013-06-23 DIAGNOSIS — A4901 Methicillin susceptible Staphylococcus aureus infection, unspecified site: Secondary | ICD-10-CM

## 2013-06-23 DIAGNOSIS — L089 Local infection of the skin and subcutaneous tissue, unspecified: Secondary | ICD-10-CM

## 2013-06-23 LAB — CBC
HCT: 23.1 % — ABNORMAL LOW (ref 39.0–52.0)
HCT: 21.5 % — ABNORMAL LOW (ref 39.0–52.0)
Hemoglobin: 7.2 g/dL — ABNORMAL LOW (ref 13.0–17.0)
Hemoglobin: 7 g/dL — ABNORMAL LOW (ref 13.0–17.0)
MCH: 28.6 pg (ref 26.0–34.0)
MCH: 29.4 pg (ref 26.0–34.0)
MCHC: 31.2 g/dL (ref 30.0–36.0)
MCHC: 32.6 g/dL (ref 30.0–36.0)
MCV: 91.7 fL (ref 78.0–100.0)
MCV: 90.3 fL (ref 78.0–100.0)
PLATELETS: 381 10*3/uL (ref 150–400)
PLATELETS: 364 10*3/uL (ref 150–400)
RBC: 2.52 MIL/uL — ABNORMAL LOW (ref 4.22–5.81)
RBC: 2.38 MIL/uL — ABNORMAL LOW (ref 4.22–5.81)
RDW: 14.4 % (ref 11.5–15.5)
RDW: 14.3 % (ref 11.5–15.5)
WBC: 18.5 10*3/uL — ABNORMAL HIGH (ref 4.0–10.5)
WBC: 19.2 10*3/uL — AB (ref 4.0–10.5)

## 2013-06-23 LAB — DIFFERENTIAL
Basophils Absolute: 0 10*3/uL (ref 0.0–0.1)
Basophils Relative: 0 % (ref 0–1)
Eosinophils Absolute: 0.4 10*3/uL (ref 0.0–0.7)
Eosinophils Relative: 2 % (ref 0–5)
LYMPHS ABS: 5.2 10*3/uL — AB (ref 0.7–4.0)
LYMPHS PCT: 28 % (ref 12–46)
MONO ABS: 1.9 10*3/uL — AB (ref 0.1–1.0)
MONOS PCT: 10 % (ref 3–12)
NEUTROS PCT: 60 % (ref 43–77)
Neutro Abs: 11 10*3/uL — ABNORMAL HIGH (ref 1.7–7.7)

## 2013-06-23 LAB — GLUCOSE, CAPILLARY
GLUCOSE-CAPILLARY: 96 mg/dL (ref 70–99)
GLUCOSE-CAPILLARY: 105 mg/dL — AB (ref 70–99)
Glucose-Capillary: 100 mg/dL — ABNORMAL HIGH (ref 70–99)
Glucose-Capillary: 125 mg/dL — ABNORMAL HIGH (ref 70–99)
Glucose-Capillary: 173 mg/dL — ABNORMAL HIGH (ref 70–99)

## 2013-06-23 LAB — HEPARIN LEVEL (UNFRACTIONATED)
Heparin Unfractionated: 0.31 IU/mL (ref 0.30–0.70)
Heparin Unfractionated: 0.24 IU/mL — ABNORMAL LOW (ref 0.30–0.70)

## 2013-06-23 LAB — PROTIME-INR
INR: 1.75 — ABNORMAL HIGH (ref 0.00–1.49)
PROTHROMBIN TIME: 19.9 s — AB (ref 11.6–15.2)

## 2013-06-23 MED ORDER — SEVELAMER CARBONATE 800 MG PO TABS
1600.0000 mg | ORAL_TABLET | Freq: Three times a day (TID) | ORAL | Status: DC
  Administered 2013-06-23 – 2013-06-27 (×9): 1600 mg via ORAL
  Filled 2013-06-23 (×20): qty 2

## 2013-06-23 MED ORDER — WARFARIN SODIUM 7.5 MG PO TABS
7.5000 mg | ORAL_TABLET | Freq: Once | ORAL | Status: AC
  Administered 2013-06-23: 7.5 mg via ORAL
  Filled 2013-06-23: qty 1

## 2013-06-23 MED ORDER — HEPARIN (PORCINE) IN NACL 100-0.45 UNIT/ML-% IJ SOLN
2850.0000 [IU]/h | INTRAMUSCULAR | Status: DC
  Administered 2013-06-23: 2850 [IU]/h via INTRAVENOUS
  Filled 2013-06-23 (×2): qty 250

## 2013-06-23 NOTE — Progress Notes (Signed)
Dr. Tyrell Antonio T/O EKG and something for pain.

## 2013-06-23 NOTE — Progress Notes (Signed)
ANTICOAGULATION CONSULT NOTE - Follow Up Consult  Pharmacy Consult for Heparin  Indication: DVT  No Known Allergies  Patient Measurements: Height: 6\' 1"  (185.4 cm) Weight: 211 lb 3.2 oz (95.8 kg) IBW/kg (Calculated) : 79.9  Vital Signs: Temp: 99.6 F (37.6 C) (05/29 2146) Temp src: Oral (05/29 2146) BP: 149/84 mmHg (05/30 0845) Pulse Rate: 97 (05/30 0845)  Labs:  Recent Labs  06/21/13 0630 06/22/13 0500 06/22/13 2325 06/23/13 0510 06/23/13 0813  HGB 7.5* 9.3*  --  7.2*  --   HCT 23.9* 28.7*  --  23.1*  --   PLT 318 295  --  381  --   LABPROT 25.0* 20.1*  --  19.9*  --   INR 2.36* 1.77*  --  1.75*  --   HEPARINUNFRC 0.28*  --  0.23*  --  0.31  CREATININE 4.92* 6.45*  --   --   --    Estimated Creatinine Clearance: 16.7 ml/min (by C-G formula based on Cr of 6.45).  Medications:  Heparin 2500 units/hr  Assessment: 43 y/o M on heparin for DVT. HL is 0.31, now therapeutic after a dose increase. Warfarin re-started but INR remains subtherapeutic at 1.75. Coumadin doses were not given on 5/27 and 5/28, likely reason INR is low and decreasing. Hgb low, plts WNL.  No signs of bleeding noted.    Goal of Therapy:  Heparin level 0.3-0.7 units/ml Monitor platelets by anticoagulation protocol: Yes   Plan:  -Continue heparin drip at 2650 units/hr, check 8 hr confirmatory level -Daily CBC/HL/INR -Monitor for bleeding -DC heparin when INR >2 -Coumadin 7.5mg  today  Thank you, Vivia Ewing, PharmD Clinical Pharmacist - Resident Pager: [phone] Pharmacy: [phone] 06/23/2013 9:37 AM

## 2013-06-23 NOTE — Progress Notes (Signed)
Patient ID: Steven Logan, male   DOB: 1970/10/18, 43 y.o.   MRN: [id number] [facility] PROGRESS NOTE  Steven Logan [id number] DOB: 06-Jan-1971 DOA: 06/02/2013 PCP: Tivis Ringer, MD  Brief narrative: 43 year old male with past medical history of morbid obesity, gout, HTN, DLD, PE, Pancreatitis, DM who presented to [facility] ED 06/02/2013 with worsening right ankle swelling for 2 weeks prior to this admission. Patient was started on prednisone for gout treatment initially which did not help. His wound opened up and started to have drainage. Patient subsequently re-presented to the ER. An MRI of the ankle revealed gas-containing abscess and cellulitis. Orthopedics took him emergently to the OR for debridement. He is status post right lower extremity transtibial amputation and remains on IV Zosyn. He was diagnosed with acute nonocclusive DVT right upper extremity and is on heparin bridging and Coumadin.   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 Continue with Vancomycin and zosyn day 2.   Fever; C diff negative. , chest x ray atelectasis... Blood culture 5-25 no growth to date. Ct LE to evaluate for abscess. Discussed with ID will get CT LE first, defer for now CT chest.   Diarrhea; better after stopping laxatives.   Acute nonocclusive DVT RUE  jugular, subclavian, axillary, and brachial veins involved  Has had recent removal of HD cath from right IJ 5/10 (which has been in place since Aug 2014)  On  heparin for anticoagulation; INR monitored by pharmacy as well Discussed with Dr Alvy Bimler, she doesn't think that hyperpigmentation is related to coumadin.  Continue with heparin Gtt and coumadin.    Anemia in chronic kidney disease / acute postoperative blood loss anemia   Has  received 2 units PRBC blood transfusion 06/18/2013.  Hb 7.3  Encephalopathy, toxic  -     Improved. Speech clear.   Mental status waxing and waining -worse on HD days   End stage renal disease on HD M/W/F   Per renal management   Hypertension   Stable, BP 155/83  DM / Hyperglycemia   A1c 7.0; good glycemic control   Hypoglycemia. Will stop lantus.   Obesity - morbid  Body mass index is 28.69  HPTH  Continue Calcitrol and renvela   Diarrhea; will stop laxatives.   Code Status: full code Family Communication: no family at the bedside  Disposition Plan: pending clinical improvement.      Consultants:   Ortho   Nephro   Procedures:   I and D on 06/03/2013   IRRIGATION AND DEBRIDEMENT EXTREMITY extensive excision of muscle fascia and soft tissue right leg 06/06/13   Transtibial amputation 5/19   Antibiotics:   Vanc 5/9 >5/17   Zosyn 5/9 >      HPI/Subjective: Feeling ok, diarrhea improving. No cough.  No SOB.   Objective: Filed Vitals:   06/22/13 1855 06/22/13 2146 06/23/13 0845 06/23/13 0910  BP:  149/84 149/84 150/79  Pulse:  104 97 100  Temp: 101.7 F (38.7 C) 99.6 F (37.6 C)  99 F (37.2 C)  TempSrc:  Oral  Oral  Resp:  18  18  Height:  6\' 1"  (1.854 m)    Weight:  95.8 kg (211 lb 3.2 oz)    SpO2:  98%      Intake/Output Summary (Last  24 hours) at 06/23/13 1321 Last data filed at 06/23/13 0920  Gross per 24 hour  Intake   2190 ml  Output      0 ml  Net   2190 ml    Exam:   General:  Pt is alert, not in acute distress  Cardiovascular: Regular rate and rhythm, S1/S2 appreciated   Respiratory: Clear to auscultation bilaterally, no wheezing  Abdomen: Soft, non tender, non distended, bowel sounds present  Extremities: right BKA, pulses DP and PT palpable bilaterally; Left AV fistula   Neuro: Grossly nonfocal  Data Reviewed: Basic Metabolic Panel:  Recent Labs Lab 06/18/13 0630 06/19/13 0555 06/20/13 0749  06/21/13 0630 06/22/13 0500  NA 134* 136* 135* 133* 132*  K 4.5 3.9 4.2 3.7 4.0  CL 94* 97 96 95* 94*  CO2 24 28 25 26 25   GLUCOSE 93 55* 66* 45* 107*  BUN 44* 23 34* 19 29*  CREATININE 8.98* 5.49* 7.11* 4.92* 6.45*  CALCIUM 6.8* 7.4* 7.0* 7.3* 7.2*  PHOS  --   --   --   --  6.8*   Liver Function Tests:  Recent Labs Lab 06/22/13 0500  ALBUMIN 1.3*   No results found for this basename: LIPASE, AMYLASE,  in the last 168 hours No results found for this basename: AMMONIA,  in the last 168 hours CBC:  Recent Labs Lab 06/19/13 0555 06/20/13 0749 06/21/13 0630 06/22/13 0500 06/23/13 0510  WBC 15.5* 14.4* 15.4* 14.2* 18.5*  NEUTROABS  --   --   --   --  11.0*  HGB 8.2* 7.3* 7.5* 9.3* 7.2*  HCT 25.3* 22.6* 23.9* 28.7* 23.1*  MCV 90.0 89.7 90.9 90.0 91.7  PLT 330 308 318 295 381   Cardiac Enzymes: No results found for this basename: CKTOTAL, CKMB, CKMBINDEX, TROPONINI,  in the last 168 hours BNP: No components found with this basename: POCBNP,  CBG:  Recent Labs Lab 06/22/13 1702 06/22/13 2144 06/23/13 0730 06/23/13 1135 06/23/13 1155  GLUCAP 133* 101* 100* 173* 125*    Recent Results (from the past 240 hour(s))  CLOSTRIDIUM DIFFICILE BY PCR     Status: None   Collection Time    06/17/13  1:09 PM      Result Value Ref Range Status   C difficile by pcr NEGATIVE  NEGATIVE Final  CULTURE, BLOOD (ROUTINE X 2)     Status: None   Collection Time    06/18/13  9:15 AM      Result Value Ref Range Status   Specimen Description BLOOD HEMODIALYSIS FISTULA   Final   Special Requests BOTTLES DRAWN AEROBIC AND ANAEROBIC 10CC   Final   Culture  Setup Time     Final   Value: 06/18/2013 16:16     Performed at [facility]   Culture     Final   Value:        BLOOD CULTURE RECEIVED NO GROWTH TO DATE CULTURE WILL BE HELD FOR 5 DAYS BEFORE ISSUING A FINAL NEGATIVE REPORT     Performed at [facility]   Report Status PENDING   Incomplete  CULTURE, BLOOD  (ROUTINE X 2)     Status: None   Collection Time    06/18/13  9:35 AM      Result Value Ref Range Status   Specimen Description BLOOD HEMODIALYSIS FISTULA   Final   Special Requests BOTTLES DRAWN AEROBIC AND ANAEROBIC 10CC   Final   Culture  Setup Time  Final   Value: 06/18/2013 16:17     Performed at [facility]   Culture     Final   Value:        BLOOD CULTURE RECEIVED NO GROWTH TO DATE CULTURE WILL BE HELD FOR 5 DAYS BEFORE ISSUING A FINAL NEGATIVE REPORT     Performed at [facility]   Report Status PENDING   Incomplete  CLOSTRIDIUM DIFFICILE BY PCR     Status: None   Collection Time    06/20/13  5:48 PM      Result Value Ref Range Status   C difficile by pcr NEGATIVE  NEGATIVE Final     Studies: No results found.  Scheduled Meds: . darbepoetin (ARANESP) injection - DIALYSIS  150 mcg Intravenous Q Fri-HD  . feeding supplement (RESOURCE BREEZE)  1 Container Oral BID BM  . ferric gluconate (FERRLECIT/NULECIT) IV  125 mg Intravenous Q M,W,F-HD  . gabapentin  100 mg Oral TID  . insulin aspart  0-20 Units Subcutaneous TID WC  . labetalol  200 mg Oral BID  . multivitamin  1 tablet Oral QHS  . piperacillin-tazobactam (ZOSYN)  IV  2.25 g Intravenous 3 times per day  . sevelamer carbonate  1,600 mg Oral TID WC  . sodium chloride  10-40 mL Intracatheter Q12H  . [START ON 06/25/2013] vancomycin  1,000 mg Intravenous Q M,W,F-HD  . warfarin  7.5 mg Oral ONCE-1800  . Warfarin - Pharmacist Dosing Inpatient   Does not apply q1800   Continuous Infusions: . sodium chloride 10 mL/hr at 06/22/13 2025  . heparin 2,650 Units/hr (06/23/13 0035)   DVT prophylaxis:  IV heparin   Elmarie Shiley, MD  [facility] Pager [phone]  If 7PM-7AM, please contact night-coverage [email or web address] Password [email or web address] 06/22/2013, 8:18 AM   LOS: 20 days

## 2013-06-23 NOTE — Progress Notes (Signed)
Unable to start PIV due to both arms being restricted.  Pt receiving heparin through central line.  Held off D/C central line as not able to start PIV.  Awaiting clarification.

## 2013-06-23 NOTE — Progress Notes (Signed)
ANTICOAGULATION CONSULT NOTE - Follow Up Consult  Pharmacy Consult for Heparin  Indication: DVT  No Known Allergies  Patient Measurements: Height: 6\' 1"  (185.4 cm) Weight: 211 lb 3.2 oz (95.8 kg) IBW/kg (Calculated) : 79.9  Vital Signs: Temp: 99.6 F (37.6 C) (05/29 2146) Temp src: Oral (05/29 2146) BP: 149/84 mmHg (05/29 2146) Pulse Rate: 104 (05/29 2146)  Labs:  Recent Labs  06/20/13 0749 06/21/13 0630 06/22/13 0500 06/22/13 2325  HGB 7.3* 7.5* 9.3*  --   HCT 22.6* 23.9* 28.7*  --   PLT 308 318 295  --   LABPROT 28.3* 25.0* 20.1*  --   INR 2.77* 2.36* 1.77*  --   HEPARINUNFRC 0.40 0.28*  --  0.23*  CREATININE 7.11* 4.92* 6.45*  --    Estimated Creatinine Clearance: 16.7 ml/min (by C-G formula based on Cr of 6.45).  Medications:  Heparin 2500 units/hr  Assessment: 43 y/o M on heparin for DVT. HL is 0.28. Warfarin re-started but INR low at 1.77, other labs as above.   Goal of Therapy:  Heparin level 0.3-0.7 units/ml Monitor platelets by anticoagulation protocol: Yes   Plan:  -Increase heparin drip to 2650 units/hr -08000 HL -Daily CBC/HL -Monitor for bleeding -DC heparin when INR >2  Narda Bonds 06/23/2013,12:13 AM

## 2013-06-23 NOTE — Progress Notes (Signed)
Paged Dr. Tyrell Antonio for clarification right and left arm restricted to pull central line and insert peripheral IV

## 2013-06-23 NOTE — Progress Notes (Signed)
ANTICOAGULATION CONSULT NOTE - Follow Up Consult  Pharmacy Consult for Heparin  Indication: DVT  No Known Allergies  Patient Measurements: Height: 6\' 1"  (185.4 cm) Weight: 211 lb 3.2 oz (95.8 kg) IBW/kg (Calculated) : 79.9  Vital Signs: Temp: 99 F (37.2 C) (05/30 0910) Temp src: Oral (05/30 0910) BP: 150/79 mmHg (05/30 0910) Pulse Rate: 100 (05/30 0910)  Labs:  Recent Labs  06/21/13 0630 06/22/13 0500 06/22/13 2325 06/23/13 0510 06/23/13 0813 06/23/13 1654  HGB 7.5* 9.3*  --  7.2*  --   --   HCT 23.9* 28.7*  --  23.1*  --   --   PLT 318 295  --  381  --   --   LABPROT 25.0* 20.1*  --  19.9*  --   --   INR 2.36* 1.77*  --  1.75*  --   --   HEPARINUNFRC 0.28*  --  0.23*  --  0.31 0.24*  CREATININE 4.92* 6.45*  --   --   --   --    Estimated Creatinine Clearance: 16.7 ml/min (by C-G formula based on Cr of 6.45).  Medications:  Heparin 2500 units/hr  Assessment: 43 y/o M on heparin for DVT. HL is 0.24 and now below goal on heparin 2650 units/hr  Goal of Therapy:  Heparin level 0.3-0.7 units/ml Monitor platelets by anticoagulation protocol: Yes   Plan:  -Increase heparin to 2850 units/hr -Heparin level in am  Hildred Laser, Pharm D 06/23/2013 5:46 PM

## 2013-06-23 NOTE — Progress Notes (Signed)
PT Cancellation Note  Patient Details Name: Steven Logan MRN: [id number] DOB: 12-15-1970   Cancelled Treatment:    Reason Eval/Treat Not Completed: Medical issues which prohibited therapy (will hold PT on today and resume after CT and pending washout procedure. Thanks.    Caroleen Hamman Marleigh Kaylor 06/23/2013, 11:33 AM Weston Anna, MPT Pager: [phone]

## 2013-06-23 NOTE — Progress Notes (Signed)
Received a call from the pharmacy c/o Santiago Glad, said to hold the ongoing Heparin infusion at 28.10ml/hr. Heparin infusion held per pharmacist telephone order.  Evern Core, RN

## 2013-06-23 NOTE — Progress Notes (Signed)
Pt  Co chest pain around "collar bone" area dull, 8/10.  Pt stated has been going on for 2-3 days.  bp  149/84 hr 97.  Paged DrRegalado. FYI

## 2013-06-23 NOTE — Progress Notes (Signed)
Patient ID: Steven Logan, male   DOB: 03-28-1970, 43 y.o.   MRN: [id number] Patient still with temperature spikes and elevated white blood cell count. Given the patient's history of significant necrotizing fasciitis necrotic muscle and large abscess will plan to get a CT scan of the right leg today and planned for irrigation and debridement of the wound tomorrow morning. Patient is at increased risk of having a deep abscess.

## 2013-06-23 NOTE — Progress Notes (Signed)
[facility] Progress Note  Assessment/Plan: 1.   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 Sharol Given rx  2.   AMS= fever Infec / vs pain med=tapered back pain meds /and afebrile / MS baseline now  3    ESRD on HD schedule MWF= resuming low dose hep  4.   RUE DVT- heparin gtt per pharm, coumadin started  5.   Fe def Anemia - hgb 7.1>8.2 >7.5 has had 3u prbc transfused, on aranesp 150/wk, Fe low > 1gm load in progress  6.   HPTH- Corr Ca 9.3 P ^ 6.8. Cont calcitriol increase renvela to 2 AC 7.   HTN/volume - labetolol tolerating uf 2.77 Wed and 4 L Friday 0 post weight not done.  Pre HD weight Friday was 105.8 and PM weight was 95.6!!! - hard to know what the patient truly weighs; BP up post HD yesterday; continue to titrate down; his pre BKA weight was 111 8.   Protein malnutrition - Nepro, mulitivit. Renal/carb mod diet - Albumin 1.3 - discussed need for increase protein with him. States he is drinking the nepro 9.   Dispo- for SNF per rehab team   Myriam Jacobson, PA-C [place] [phone] 06/23/2013,8:21 AM  LOS: 21 days   Subjective:   Having some diarrhea. Bottom sore  Objective Filed Vitals:   06/22/13 1348 06/22/13 1715 06/22/13 1855 06/22/13 2146  BP: 194/82 189/90  149/84  Pulse: 104 116  104  Temp: 99.9 F (37.7 C) 102.9 F (39.4 C) 101.7 F (38.7 C) 99.6 F (37.6 C)  TempSrc: Oral Oral  Oral  Resp: 18 20  18   Height:    6\' 1"  (1.854 m)  Weight:    95.8 kg (211 lb 3.2 oz)  SpO2: 96% 99%  98%   Physical Exam General: obese,  lying in bed on stomach Heart: tachy regular Lungs: no wheezes/rales Abdomen: soft Buttocks: denuded superficial skin breakdown Extremities:upper extremity muscle wasting, right BKA stump, some blood on wrap - last changed yesterday Dialysis Access: left AVF + bruit  Dialysis Orders: MWF [facility]  4.5h 111.5kg 2/2.5 Bath 500/800 Heparin 11000 LFA  AVF  Calcitriol 1.25ug EPO 8000 Venofer s/p load completed 5/11    Additional Objective Labs: Lab Results  Component Value Date   INR 1.75* 06/23/2013   INR 1.77* 06/22/2013   INR 2.36* [id number]    Basic Metabolic Panel:  Recent Labs Lab 06/20/13 0749 06/21/13 0630 06/22/13 0500  NA 135* 133* 132*  K 4.2 3.7 4.0  CL 96 95* 94*  CO2 25 26 25   GLUCOSE 66* 45* 107*  BUN 34* 19 29*  CREATININE 7.11* 4.92* 6.45*  CALCIUM 7.0* 7.3* 7.2*  PHOS  --   --  6.8*   Liver Function Tests:  Recent Labs Lab 06/22/13 0500  ALBUMIN 1.3*   CBC:  Recent Labs Lab 06/19/13 0555 06/20/13 0749 06/21/13 0630 06/22/13 0500 06/23/13 0510  WBC 15.5* 14.4* 15.4* 14.2* 18.5*  HGB 8.2* 7.3* 7.5* 9.3* 7.2*  HCT 25.3* 22.6* 23.9* 28.7* 23.1*  MCV 90.0 89.7 90.9 90.0 91.7  PLT 330 308 318 295 381   Blood Culture    Component Value Date/Time   SDES BLOOD HEMODIALYSIS FISTULA 06/18/2013 0935   SPECREQUEST BOTTLES DRAWN AEROBIC AND ANAEROBIC 10CC 06/18/2013 0935   CULT  Value:        BLOOD CULTURE RECEIVED NO  GROWTH TO DATE CULTURE WILL BE HELD FOR 5 DAYS BEFORE ISSUING A FINAL NEGATIVE REPORT Performed at [facility] 06/18/2013 0935   REPTSTATUS PENDING 06/18/2013 0935   CBG:  Recent Labs Lab 06/21/13 1945 06/22/13 0750 06/22/13 1332 06/22/13 1702 06/22/13 2144  GLUCAP 96 98 103* 133* 101*   Iron Studies:  Recent Labs  06/22/13 0906  IRON 23*  TIBC 118*  Medications: . sodium chloride 10 mL/hr at 06/22/13 2025  . heparin 2,650 Units/hr (06/23/13 0035)   . darbepoetin (ARANESP) injection - DIALYSIS  150 mcg Intravenous Q Fri-HD  . feeding supplement (RESOURCE BREEZE)  1 Container Oral BID BM  . ferric gluconate (FERRLECIT/NULECIT) IV  125 mg Intravenous Q M,W,F-HD  . gabapentin  100 mg Oral TID  . insulin aspart  0-20 Units Subcutaneous TID WC  . labetalol  200 mg Oral BID  . multivitamin  1 tablet Oral QHS  . piperacillin-tazobactam (ZOSYN)  IV  2.25 g  Intravenous 3 times per day  . sevelamer carbonate  800 mg Oral TID WC  . sodium chloride  10-40 mL Intracatheter Q12H  . [START ON 06/25/2013] vancomycin  1,000 mg Intravenous Q M,W,F-HD  . Warfarin - Pharmacist Dosing Inpatient   Does not apply [phone]

## 2013-06-23 NOTE — Progress Notes (Signed)
I have seen and examined this patient and agree with plan as outlined by M. Reinaldo Meeker, PA-C.  Spoke with Dr. Sharol Given and Dr. Tyrell Antonio.   For furrther revision of BKA due to bullous lesion, and drainage/pain. Steven John A Marvelene Stoneberg,MD 06/23/2013 11:05 AM

## 2013-06-23 NOTE — Progress Notes (Signed)
[place] for Infectious Disease    Date of Admission:  06/02/2013   Total days of antibiotics 22        Day 22 piptazo        Day 2 vanco ( reinitiated           ID: Steven Logan is a 43 y.o. 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 Febrile to 102.9 yesterday and increasing wbc of 18.5  Principal Problem:   Severe sepsis Active Problems:   HTN (hypertension)   Pulmonary embolism, bilateral   Abscess of right lower leg   Encephalopathy, toxic   Anemia in chronic kidney disease   Arm edema   Leg abscess   Septic shock   Septic arthritis of ankle or foot, right   ESRD on dialysis   Hyperglycemia   Arm DVT (deep venous thromboembolism), acute    Subjective: Febrile to 102.9 yesterday still having painful leg and arm pain. Had episode of clavicular pain/chest pain yesterday now resolved  Medications:  . darbepoetin (ARANESP) injection - DIALYSIS  150 mcg Intravenous Q Fri-HD  . feeding supplement (RESOURCE BREEZE)  1 Container Oral BID BM  . ferric gluconate (FERRLECIT/NULECIT) IV  125 mg Intravenous Q M,W,F-HD  . gabapentin  100 mg Oral TID  . insulin aspart  0-20 Units Subcutaneous TID WC  . labetalol  200 mg Oral BID  . multivitamin  1 tablet Oral QHS  . piperacillin-tazobactam (ZOSYN)  IV  2.25 g Intravenous 3 times per day  . sevelamer carbonate  1,600 mg Oral TID WC  . sodium chloride  10-40 mL Intracatheter Q12H  . [START ON 06/25/2013] vancomycin  1,000 mg Intravenous Q M,W,F-HD  . warfarin  7.5 mg Oral ONCE-1800  . Warfarin - Pharmacist Dosing Inpatient   Does not apply q1800    Objective: Vital signs in last 24 hours: Temp:  [98.4 F (36.9 C)-102.9 F (39.4 C)] 99.6 F (37.6 C) (05/29 2146) Pulse Rate:  [97-128] 97 (05/30 0845) Resp:  [18-20] 18 (05/29 2146) BP: (121-194)/(67-91) 149/84 mmHg (05/30  0845) SpO2:  [95 %-99 %] 98 % (05/29 2146) Weight:  [211 lb 3.2 oz (95.8 kg)] 211 lb 3.2 oz (95.8 kg) (05/29 2146) Physical Exam  Constitutional: He is oriented to person, place, and time. He appears well-developed and well-nourished. No distress.  HENT:  Mouth/Throat: Oropharynx is clear and moist. No oropharyngeal exudate.  Cardiovascular: Normal rate, regular rhythm and normal heart sounds. Exam reveals no gallop and no friction rub.  No murmur heard.  Pulmonary/Chest: Effort normal and breath sounds normal. No respiratory distress. He has no wheezes.  Abdominal: Soft. Bowel sounds are normal. He exhibits no distension. There is no tenderness.  Lymphadenopathy:  He has no cervical adenopathy.  Ext: right bka wrapped, lower leg warm to touch Skin: Skin is warm and dry. No rash noted. No erythema.  Psychiatric: He has a normal mood and affect. His behavior is normal.    Lab Results  Recent Labs  06/21/13 0630 06/22/13 0500 06/23/13 0510  WBC 15.4* 14.2* 18.5*  HGB 7.5* 9.3* 7.2*  HCT 23.9* 28.7* 23.1*  NA 133* 132*  --   K 3.7 4.0  --   CL 95* 94*  --   CO2 26 25  --   BUN 19 29*  --  CREATININE 4.92* 6.45*  --    Liver Panel  Recent Labs  06/22/13 0500  ALBUMIN 1.3*    Microbiology: 5/11 right ankle wound cx: MSSA 5/25 blood cx: NGTD Studies/Results: No results found.   Assessment/Plan: On going fever/leukocytosis = strongly feel that we do not have source control. Please get repeat imaging on right stump and chest CT. Concern for emboli from dvt fro picc line. Please have orthopedics/duda look at patient legs. Will add differential today's lab.   MSSA infection = continue on piptazo for now, will narrow after planned washout tomorrow  Deep tissue infection = recommend imaging of stump to see if residual abscess fluid collection is causing on going fevers and leukocytosis.  [facility] for Infectious Diseases Cell: [phone] Pager:  [phone]  06/23/2013, 9:58 AM

## 2013-06-23 NOTE — Progress Notes (Signed)
Surgeon cosulted with patient changed drg.

## 2013-06-24 ENCOUNTER — Encounter (HOSPITAL_COMMUNITY)
Admission: EM | Disposition: A | Discharge status: Skilled Nursing Facility | Payer: Self-pay | Source: Home / Self Care | Attending: Internal Medicine

## 2013-06-24 ENCOUNTER — Inpatient Hospital Stay (HOSPITAL_COMMUNITY): Payer: BC Managed Care – PPO | Admitting: Certified Registered"

## 2013-06-24 ENCOUNTER — Encounter (HOSPITAL_COMMUNITY): Payer: BC Managed Care – PPO | Admitting: Certified Registered"

## 2013-06-24 ENCOUNTER — Encounter (HOSPITAL_COMMUNITY): Payer: Self-pay | Admitting: Certified Registered"

## 2013-06-24 HISTORY — PX: I & D EXTREMITY: SHX5045

## 2013-06-24 LAB — POCT I-STAT 4, (NA,K, GLUC, HGB,HCT)
Glucose, Bld: 114 mg/dL — ABNORMAL HIGH (ref 70–99)
HCT: 20 % — ABNORMAL LOW (ref 39.0–52.0)
Hemoglobin: 6.8 g/dL — CL (ref 13.0–17.0)
Potassium: 3.5 meq/L — ABNORMAL LOW (ref 3.7–5.3)
Sodium: 135 meq/L — ABNORMAL LOW (ref 137–147)

## 2013-06-24 LAB — PROTIME-INR
INR: 1.85 — ABNORMAL HIGH (ref 0.00–1.49)
PROTHROMBIN TIME: 20.8 s — AB (ref 11.6–15.2)

## 2013-06-24 LAB — GLUCOSE, CAPILLARY
GLUCOSE-CAPILLARY: 123 mg/dL — AB (ref 70–99)
GLUCOSE-CAPILLARY: 111 mg/dL — AB (ref 70–99)
GLUCOSE-CAPILLARY: 133 mg/dL — AB (ref 70–99)
Glucose-Capillary: 159 mg/dL — ABNORMAL HIGH (ref 70–99)
Glucose-Capillary: 121 mg/dL — ABNORMAL HIGH (ref 70–99)

## 2013-06-24 LAB — CBC
HCT: 20 % — ABNORMAL LOW (ref 39.0–52.0)
HCT: 19.5 % — ABNORMAL LOW (ref 39.0–52.0)
HEMOGLOBIN: 6.5 g/dL — AB (ref 13.0–17.0)
Hemoglobin: 6.3 g/dL — CL (ref 13.0–17.0)
MCH: 28.8 pg (ref 26.0–34.0)
MCH: 29.3 pg (ref 26.0–34.0)
MCHC: 31.5 g/dL (ref 30.0–36.0)
MCHC: 33.3 g/dL (ref 30.0–36.0)
MCV: 91.3 fL (ref 78.0–100.0)
MCV: 87.8 fL (ref 78.0–100.0)
PLATELETS: 285 10*3/uL (ref 150–400)
Platelets: 361 10*3/uL (ref 150–400)
RBC: 2.19 MIL/uL — AB (ref 4.22–5.81)
RBC: 2.22 MIL/uL — ABNORMAL LOW (ref 4.22–5.81)
RDW: 14.2 % (ref 11.5–15.5)
RDW: 15.5 % (ref 11.5–15.5)
WBC: 17.1 10*3/uL — AB (ref 4.0–10.5)
WBC: 16.7 10*3/uL — ABNORMAL HIGH (ref 4.0–10.5)

## 2013-06-24 LAB — CULTURE, BLOOD (ROUTINE X 2)
Culture: NO GROWTH
Culture: NO GROWTH

## 2013-06-24 LAB — PREPARE RBC (CROSSMATCH)

## 2013-06-24 SURGERY — IRRIGATION AND DEBRIDEMENT EXTREMITY
Anesthesia: General | Site: Leg Lower | Laterality: Right

## 2013-06-24 MED ORDER — PROPOFOL 10 MG/ML IV BOLUS
INTRAVENOUS | Status: DC | PRN
  Administered 2013-06-24: 200 mg via INTRAVENOUS

## 2013-06-24 MED ORDER — ONDANSETRON HCL 4 MG/2ML IJ SOLN: 4.0000 mg | Freq: Four times a day (QID) | INTRAMUSCULAR | Status: DC | PRN

## 2013-06-24 MED ORDER — ALTEPLASE 2 MG IJ SOLR
2.0000 mg | Freq: Once | INTRAMUSCULAR | Status: AC | PRN
  Filled 2013-06-24: qty 2

## 2013-06-24 MED ORDER — PROPOFOL 10 MG/ML IV BOLUS
INTRAVENOUS | Status: AC
  Filled 2013-06-24: qty 20

## 2013-06-24 MED ORDER — OXYCODONE HCL 5 MG/5ML PO SOLN: 5.0000 mg | Freq: Once | ORAL | Status: DC | PRN

## 2013-06-24 MED ORDER — HYDROMORPHONE HCL PF 1 MG/ML IJ SOLN: 0.2500 mg | INTRAMUSCULAR | Status: DC | PRN

## 2013-06-24 MED ORDER — LIDOCAINE HCL (CARDIAC) 20 MG/ML IV SOLN
INTRAVENOUS | Status: AC
  Filled 2013-06-24: qty 5

## 2013-06-24 MED ORDER — HEPARIN SODIUM (PORCINE) 1000 UNIT/ML DIALYSIS
1000.0000 [IU] | INTRAMUSCULAR | Status: DC | PRN
  Filled 2013-06-24: qty 1

## 2013-06-24 MED ORDER — NEPRO/CARBSTEADY PO LIQD: 237.0000 mL | ORAL | Status: DC | PRN

## 2013-06-24 MED ORDER — CHLORHEXIDINE GLUCONATE 4 % EX LIQD
60.0000 mL | Freq: Once | CUTANEOUS | Status: AC
  Administered 2013-06-24: 4 via TOPICAL
  Filled 2013-06-24: qty 60

## 2013-06-24 MED ORDER — ONDANSETRON HCL 4 MG/2ML IJ SOLN
INTRAMUSCULAR | Status: DC | PRN
Start: 2013-06-24 — End: 2013-06-24
  Administered 2013-06-24: 4 mg via INTRAVENOUS

## 2013-06-24 MED ORDER — SODIUM CHLORIDE 0.9 % IV SOLN
INTRAVENOUS | Status: DC | PRN
  Administered 2013-06-24: 08:00:00 via INTRAVENOUS

## 2013-06-24 MED ORDER — FENTANYL CITRATE 0.05 MG/ML IJ SOLN
INTRAMUSCULAR | Status: AC
  Filled 2013-06-24: qty 5

## 2013-06-24 MED ORDER — OXYCODONE HCL 5 MG PO TABS: 5.0000 mg | ORAL_TABLET | Freq: Once | ORAL | Status: DC | PRN

## 2013-06-24 MED ORDER — METHOCARBAMOL 500 MG PO TABS
500.0000 mg | ORAL_TABLET | Freq: Four times a day (QID) | ORAL | Status: DC | PRN
  Administered 2013-06-26 – 2013-07-02 (×11): 500 mg via ORAL
  Filled 2013-06-24 (×11): qty 1

## 2013-06-24 MED ORDER — SODIUM CHLORIDE 0.9 % IJ SOLN
INTRAMUSCULAR | Status: AC
  Filled 2013-06-24: qty 10

## 2013-06-24 MED ORDER — OXYCODONE-ACETAMINOPHEN 5-325 MG PO TABS
1.0000 | ORAL_TABLET | ORAL | Status: DC | PRN
  Administered 2013-06-24 – 2013-07-02 (×25): 2 via ORAL
  Filled 2013-06-24: qty 1
  Filled 2013-06-24 (×22): qty 2

## 2013-06-24 MED ORDER — SUCCINYLCHOLINE CHLORIDE 20 MG/ML IJ SOLN
INTRAMUSCULAR | Status: AC
  Filled 2013-06-24: qty 1

## 2013-06-24 MED ORDER — METOCLOPRAMIDE HCL 5 MG PO TABS: 5.0000 mg | ORAL_TABLET | Freq: Three times a day (TID) | ORAL | Status: DC | PRN

## 2013-06-24 MED ORDER — LIDOCAINE HCL (PF) 1 % IJ SOLN: 5.0000 mL | INTRAMUSCULAR | Status: DC | PRN

## 2013-06-24 MED ORDER — MIDAZOLAM HCL 5 MG/5ML IJ SOLN
INTRAMUSCULAR | Status: DC | PRN
  Administered 2013-06-24: 2 mg via INTRAVENOUS

## 2013-06-24 MED ORDER — HYDROMORPHONE HCL PF 1 MG/ML IJ SOLN
0.5000 mg | INTRAMUSCULAR | Status: DC | PRN
  Administered 2013-06-24 – 2013-06-30 (×15): 1 mg via INTRAVENOUS
  Filled 2013-06-24 (×14): qty 1

## 2013-06-24 MED ORDER — SODIUM CHLORIDE 0.9 % IV SOLN: 100.0000 mL | INTRAVENOUS | Status: DC | PRN

## 2013-06-24 MED ORDER — EPHEDRINE SULFATE 50 MG/ML IJ SOLN
INTRAMUSCULAR | Status: AC
  Filled 2013-06-24: qty 1

## 2013-06-24 MED ORDER — PENTAFLUOROPROP-TETRAFLUOROETH EX AERO: 1.0000 "application " | INHALATION_SPRAY | CUTANEOUS | Status: DC | PRN

## 2013-06-24 MED ORDER — DOCUSATE SODIUM 100 MG PO CAPS
100.0000 mg | ORAL_CAPSULE | Freq: Two times a day (BID) | ORAL | Status: DC
  Administered 2013-06-24 – 2013-06-30 (×5): 100 mg via ORAL
  Filled 2013-06-24 (×19): qty 1

## 2013-06-24 MED ORDER — LABETALOL HCL 5 MG/ML IV SOLN
INTRAVENOUS | Status: DC | PRN
  Administered 2013-06-24: 10 mg via INTRAVENOUS

## 2013-06-24 MED ORDER — METOCLOPRAMIDE HCL 5 MG/ML IJ SOLN: 5.0000 mg | Freq: Three times a day (TID) | INTRAMUSCULAR | Status: DC | PRN

## 2013-06-24 MED ORDER — METHOCARBAMOL 1000 MG/10ML IJ SOLN
500.0000 mg | Freq: Four times a day (QID) | INTRAVENOUS | Status: DC | PRN
  Administered 2013-06-27: 500 mg via INTRAVENOUS
  Filled 2013-06-24 (×2): qty 5

## 2013-06-24 MED ORDER — FENTANYL CITRATE 0.05 MG/ML IJ SOLN
INTRAMUSCULAR | Status: DC | PRN
Start: 2013-06-24 — End: 2013-06-24
  Administered 2013-06-24 (×4): 50 ug via INTRAVENOUS
  Administered 2013-06-24: 100 ug via INTRAVENOUS

## 2013-06-24 MED ORDER — MIDAZOLAM HCL 2 MG/2ML IJ SOLN
INTRAMUSCULAR | Status: AC
  Filled 2013-06-24: qty 2

## 2013-06-24 MED ORDER — LIDOCAINE-PRILOCAINE 2.5-2.5 % EX CREA: 1.0000 "application " | TOPICAL_CREAM | CUTANEOUS | Status: DC | PRN

## 2013-06-24 MED ORDER — ONDANSETRON HCL 4 MG/2ML IJ SOLN
INTRAMUSCULAR | Status: AC
  Filled 2013-06-24: qty 2

## 2013-06-24 MED ORDER — PHENYLEPHRINE 40 MCG/ML (10ML) SYRINGE FOR IV PUSH (FOR BLOOD PRESSURE SUPPORT)
PREFILLED_SYRINGE | INTRAVENOUS | Status: AC
  Filled 2013-06-24: qty 10

## 2013-06-24 MED ORDER — WARFARIN SODIUM 5 MG PO TABS
5.0000 mg | ORAL_TABLET | Freq: Once | ORAL | Status: DC
  Filled 2013-06-24: qty 1

## 2013-06-24 MED ORDER — ONDANSETRON HCL 4 MG PO TABS
4.0000 mg | ORAL_TABLET | Freq: Four times a day (QID) | ORAL | Status: DC | PRN
  Administered 2013-07-01: 4 mg via ORAL
  Filled 2013-06-24: qty 1

## 2013-06-24 MED ORDER — PRO-STAT SUGAR FREE PO LIQD
30.0000 mL | Freq: Three times a day (TID) | ORAL | Status: DC
  Administered 2013-06-25 – 2013-07-02 (×16): 30 mL via ORAL
  Filled 2013-06-24 (×24): qty 30

## 2013-06-24 SURGICAL SUPPLY — 50 items
BLADE SAW RECIP 87.9 MT (BLADE) ×3 IMPLANT
BLADE SURG 10 STRL SS (BLADE) IMPLANT
BNDG COHESIVE 4X5 TAN STRL (GAUZE/BANDAGES/DRESSINGS) ×3 IMPLANT
BNDG COHESIVE 6X5 TAN STRL LF (GAUZE/BANDAGES/DRESSINGS) IMPLANT
BNDG GAUZE ELAST 4 BULKY (GAUZE/BANDAGES/DRESSINGS) ×3 IMPLANT
COVER SURGICAL LIGHT HANDLE (MISCELLANEOUS) ×3 IMPLANT
CUFF TOURNIQUET SINGLE 18IN (TOURNIQUET CUFF) IMPLANT
CUFF TOURNIQUET SINGLE 24IN (TOURNIQUET CUFF) IMPLANT
CUFF TOURNIQUET SINGLE 34IN LL (TOURNIQUET CUFF) IMPLANT
CUFF TOURNIQUET SINGLE 44IN (TOURNIQUET CUFF) IMPLANT
DRAPE U-SHAPE 47X51 STRL (DRAPES) ×3 IMPLANT
DRSG ADAPTIC 3X8 NADH LF (GAUZE/BANDAGES/DRESSINGS) ×6 IMPLANT
DURAPREP 26ML APPLICATOR (WOUND CARE) ×3 IMPLANT
ELECT CAUTERY BLADE 6.4 (BLADE) IMPLANT
ELECT REM PT RETURN 9FT ADLT (ELECTROSURGICAL)
ELECTRODE REM PT RTRN 9FT ADLT (ELECTROSURGICAL) IMPLANT
GLOVE BIOGEL PI IND STRL 7.5 (GLOVE) ×2 IMPLANT
GLOVE BIOGEL PI IND STRL 9 (GLOVE) ×1 IMPLANT
GLOVE BIOGEL PI INDICATOR 7.5 (GLOVE) ×4
GLOVE BIOGEL PI INDICATOR 9 (GLOVE) ×2
GLOVE SURG ORTHO 9.0 STRL STRW (GLOVE) ×3 IMPLANT
GLOVE SURG SS PI 6.5 STRL IVOR (GLOVE) ×3 IMPLANT
GLOVE SURG SS PI 7.0 STRL IVOR (GLOVE) ×6 IMPLANT
GOWN STRL REUS W/ TWL XL LVL3 (GOWN DISPOSABLE) ×2 IMPLANT
GOWN STRL REUS W/TWL XL LVL3 (GOWN DISPOSABLE) ×4
HANDPIECE INTERPULSE COAX TIP (DISPOSABLE) ×2
KIT BASIN OR (CUSTOM PROCEDURE TRAY) ×3 IMPLANT
KIT ROOM TURNOVER OR (KITS) ×3 IMPLANT
MANIFOLD NEPTUNE II (INSTRUMENTS) ×3 IMPLANT
NS IRRIG 1000ML POUR BTL (IV SOLUTION) ×3 IMPLANT
PACK ORTHO EXTREMITY (CUSTOM PROCEDURE TRAY) ×3 IMPLANT
PAD ABD 8X10 STRL (GAUZE/BANDAGES/DRESSINGS) ×6 IMPLANT
PAD ARMBOARD 7.5X6 YLW CONV (MISCELLANEOUS) ×6 IMPLANT
PADDING CAST COTTON 6X4 STRL (CAST SUPPLIES) ×3 IMPLANT
PENCIL BUTTON HOLSTER BLD 10FT (ELECTRODE) ×3 IMPLANT
SET HNDPC FAN SPRY TIP SCT (DISPOSABLE) ×1 IMPLANT
SPONGE GAUZE 4X4 12PLY (GAUZE/BANDAGES/DRESSINGS) ×3 IMPLANT
SPONGE LAP 18X18 X RAY DECT (DISPOSABLE) ×3 IMPLANT
STOCKINETTE IMPERVIOUS 9X36 MD (GAUZE/BANDAGES/DRESSINGS) IMPLANT
STOCKINETTE IMPERVIOUS LG (DRAPES) ×3 IMPLANT
SUT ETHILON 2 0 PSLX (SUTURE) ×6 IMPLANT
SUT SILK 2 0 TIES 10X30 (SUTURE) ×3 IMPLANT
TOWEL OR 17X24 6PK STRL BLUE (TOWEL DISPOSABLE) ×3 IMPLANT
TOWEL OR 17X26 10 PK STRL BLUE (TOWEL DISPOSABLE) ×3 IMPLANT
TUBE ANAEROBIC SPECIMEN COL (MISCELLANEOUS) IMPLANT
TUBE CONNECTING 12'X1/4 (SUCTIONS) ×1
TUBE CONNECTING 12X1/4 (SUCTIONS) ×2 IMPLANT
UNDERPAD 30X30 INCONTINENT (UNDERPADS AND DIAPERS) ×3 IMPLANT
WATER STERILE IRR 1000ML POUR (IV SOLUTION) ×3 IMPLANT
YANKAUER SUCT BULB TIP NO VENT (SUCTIONS) ×3 IMPLANT

## 2013-06-24 NOTE — Progress Notes (Signed)
Dr Tyrell Antonio t/o transfuse 1 unit PRBC's slowly, paged Amalia Hailey, [facility], advised he 6.5, stated only transfuse 1 unit and will change dialysis order if needed more blood.

## 2013-06-24 NOTE — Op Note (Signed)
06/02/2013 - 06/24/2013  11:15 AM  PATIENT:  Steven Logan    PRE-OPERATIVE DIAGNOSIS:  Fever elevated white blood cell count and hematoma right transtibial amputation  POST-OPERATIVE DIAGNOSIS:  Progressive ischemic muscle changes no abscess with large hematoma right transtibial amputation  PROCEDURE:  Revision right transtibial amputation  SURGEON:  Newt Minion, MD  PHYSICIAN ASSISTANT:None ANESTHESIA:   General  PREOPERATIVE INDICATIONS:  [name] is a  43 y.o. male with a diagnosis of fasciotomy who failed conservative measures and elected for surgical management.    The risks benefits and alternatives were discussed with the patient preoperatively including but not limited to the risks of infection, bleeding, nerve injury, cardiopulmonary complications, the need for revision surgery, among others, and the patient was willing to proceed.  OPERATIVE IMPLANTS: None  OPERATIVE FINDINGS: No abscess but progressive necrotic muscle changes right transtibial amputation  OPERATIVE PROCEDURE: Patient was brought to the operating room and underwent a general anesthetic. After adequate levels and anesthesia obtained patient's right lower extremity was prepped using DuraPrep and draped into a sterile field. The surgical incision was ellipsed out in one block of tissue. Patient had a massive hematoma. This was debrided the wound is irrigated with pulsatile lavage. There is progressive necrotic changes to all the muscle groups. The tibia and fibula were transected 4 cm proximal to the previous transection. Further excision of necrotic muscle was provided with using 10 blade knife as well as a Ronjair. The muscle was viable but did not have good contractility. There was good bleeding. After further irrigation debridement and wound was cleansed 2-0 silk was used to suture ligate the vessels. 2-0 nylon was used to close the incision. A compressive sterile dressing was applied patient was  extubated taken to the PACU in stable condition. Do to the very marginal soft tissue envelope patient will require a revision to an above-the-knee amputation. Plan for revision surgery next week pending patient's consent.

## 2013-06-24 NOTE — Progress Notes (Signed)
Received a call from RN regarding pt's Hgb of only 6.5 after tx of 2units PRBC this am prior to surgery. K this am only 3.5 - should be able to handle volume - for HD Monday - will revise orders to do HD in am and can transfuse additional blood during dialysis if needed.\  Amalia Hailey, PA-C

## 2013-06-24 NOTE — Transfer of Care (Signed)
Immediate Anesthesia Transfer of Care Note  Patient: Steven Logan  Procedure(s) Performed: Procedure(s): IRRIGATION AND DEBRIDEMENT and Revsion of TRANSTIBIAL AMPUTATION (Right)  Patient Location: PACU  Anesthesia Type:General  Level of Consciousness: awake, alert , oriented and patient cooperative  Airway & Oxygen Therapy: Patient Spontanous Breathing and Patient connected to nasal cannula oxygen  Post-op Assessment: Report given to PACU RN, Post -op Vital signs reviewed and stable and Patient moving all extremities  Post vital signs: Reviewed and stable  Complications: No apparent anesthesia complications

## 2013-06-24 NOTE — H&P (View-Only) (Signed)
Patient ID: Steven Logan, male   DOB: 1970/07/06, 43 y.o.   MRN: [id number] Patient still with temperature spikes and elevated white blood cell count. Given the patient's history of significant necrotizing fasciitis necrotic muscle and large abscess will plan to get a CT scan of the right leg today and planned for irrigation and debridement of the wound tomorrow morning. Patient is at increased risk of having a deep abscess.

## 2013-06-24 NOTE — Progress Notes (Signed)
Critical Hemoglobin 6.5.  Pt received 2 units PRBC's in IR.  Paged Dr. Reginia Naas

## 2013-06-24 NOTE — Progress Notes (Signed)
CRITICAL VALUE ALERT  Critical value received:  Hem 6.5  Date of notification:  06/24/13  Time of notification:  1705  Critical value read back:yes   Nurse who received alert:  Kizzie Ide  MD notified (1st page):  Dr. Tyrell Antonio  Time of first page:  06/24/13   Responding MD: Dr. Eston Mould  Time MD responded:1720

## 2013-06-24 NOTE — Progress Notes (Signed)
Patient ID: Steven Logan, male   DOB: 02/03/1970, 43 y.o.   MRN: [id number] [facility] PROGRESS NOTE  Steven Logan [id number] DOB: Oct 28, 1970 DOA: 06/02/2013 PCP: Tivis Ringer, MD  Brief narrative: 43 year old male with past medical history of morbid obesity, gout, HTN, DLD, PE, Pancreatitis, DM who presented to [facility] ED 06/02/2013 with worsening right ankle swelling for 2 weeks prior to this admission. Patient was started on prednisone for gout treatment initially which did not help. His wound opened up and started to have drainage. Patient subsequently re-presented to the ER. An MRI of the ankle revealed gas-containing abscess and cellulitis. Orthopedics took him emergently to the OR for debridement. He is status post right lower extremity transtibial amputation and remains on IV Zosyn. He was diagnosed with acute nonocclusive DVT right upper extremity and is on heparin bridging and Coumadin.   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 Continue with Vancomycin and zosyn day 2.   Fever; C diff negative. , chest x ray atelectasis... Blood culture 5-25 no growth to date. Ct LE to evaluate for abscess. Discussed with ID will get CT LE first, defer for now CT chest.   Diarrhea; better after stopping laxatives.   Acute nonocclusive DVT RUE  jugular, subclavian, axillary, and brachial veins involved  Has had recent removal of HD cath from right IJ 5/10 (which has been in place since Aug 2014)  On  heparin for anticoagulation; INR monitored by pharmacy as well Discussed with Dr Alvy Bimler, she doesn't think that hyperpigmentation is related to coumadin.  heparin Gtt and coumadin on hold duet to large R LE hematoma, and anemia.  Will follow up Dr Sharol Given recomendation on when we can  resume heparin, coumadin ?  He denies chest pain. Yesterday he had pain collar bone after lying in bed. Will need to monitor for chest pain, dyspnea.   Anemia in chronic kidney disease / acute postoperative blood loss anemia  Has received 2 units PRBC blood transfusion 06/18/2013. Hb drop to 6. CT with  Large (20 cm x 9 cm x 12 cm) multiloculated hematoma in the posterior leg extending from the region of the myocutaneous flap along the dorsal leg to the popliteal fossa. Received 2 units on 5-31. Will repeat HB tonight.  Heparin is on hold. Coumadin on hold.    Encephalopathy, toxic  -     Improved. Speech clear.    End stage renal disease on HD M/W/F   Per renal management   Hypertension   Stable.   DM / Hyperglycemia   A1c 7.0; good glycemic control   Had hypoglycemic episodes, lantus was discontinue.   Obesity - morbid  Body mass index is 28.69  HPTH  Continue Calcitrol and renvela   Diarrhea; will stop laxatives.   Code Status: full code Family Communication: no family at the bedside  Disposition Plan: pending clinical improvement.     Consultants:   Ortho   Nephro   Procedures:   I and D on 06/03/2013   IRRIGATION AND DEBRIDEMENT EXTREMITY extensive excision of muscle fascia and soft tissue right leg 06/06/13   Transtibial amputation 5/19   ID 5-31.   Antibiotics:   Vanc 5/9 >5/17 ---5-29  Zosyn 5/9 > ---5-29  HPI/Subjective: He is tired, just came from  surgery. He denies chest pain. He had pain on his collar bone yesterday. No SOB.   Objective: Filed Vitals:   06/24/13 0541 06/24/13 1121 06/24/13 1136 06/24/13 1147  BP:  143/64 135/66 132/67  Pulse:  95 93 95  Temp: 100.1 F (37.8 C) 98.4 F (36.9 C)  98.3 F (36.8 C)  TempSrc: Oral     Resp:  17 18 17   Height:      Weight:      SpO2:  100% 100% 100%    Intake/Output Summary (Last 24 hours) at 06/24/13 1434 Last data filed at 06/24/13 1104  Gross per 24 hour  Intake   1810 ml   Output    200 ml  Net   1610 ml    Exam:   General:  Pt is alert, not in acute distress  Cardiovascular: Regular rate and rhythm, S1/S2 appreciated   Respiratory: Clear to auscultation bilaterally, no wheezing  Abdomen: Soft, non tender, non distended, bowel sounds present  Extremities: right BKA with dressing.  pulses DP and PT palpable bilaterally; Left AV fistula   Neuro: Grossly nonfocal  Data Reviewed: Basic Metabolic Panel:  Recent Labs Lab 06/18/13 0630 06/19/13 0555 06/20/13 0749 06/21/13 0630 06/22/13 0500 06/24/13 0747  NA 134* 136* 135* 133* 132* 135*  K 4.5 3.9 4.2 3.7 4.0 3.5*  CL 94* 97 96 95* 94*  --   CO2 24 28 25 26 25   --   GLUCOSE 93 55* 66* 45* 107* 114*  BUN 44* 23 34* 19 29*  --   CREATININE 8.98* 5.49* 7.11* 4.92* 6.45*  --   CALCIUM 6.8* 7.4* 7.0* 7.3* 7.2*  --   PHOS  --   --   --   --  6.8*  --    Liver Function Tests:  Recent Labs Lab 06/22/13 0500  ALBUMIN 1.3*   No results found for this basename: LIPASE, AMYLASE,  in the last 168 hours No results found for this basename: AMMONIA,  in the last 168 hours CBC:  Recent Labs Lab 06/21/13 0630 06/22/13 0500 06/23/13 0510 06/23/13 2259 06/24/13 0515 06/24/13 0747  WBC 15.4* 14.2* 18.5* 19.2* 17.1*  --   NEUTROABS  --   --  11.0*  --   --   --   HGB 7.5* 9.3* 7.2* 7.0* 6.3* 6.8*  HCT 23.9* 28.7* 23.1* 21.5* 20.0* 20.0*  MCV 90.9 90.0 91.7 90.3 91.3  --   PLT 318 295 381 364 361  --    Cardiac Enzymes: No results found for this basename: CKTOTAL, CKMB, CKMBINDEX, TROPONINI,  in the last 168 hours BNP: No components found with this basename: POCBNP,  CBG:  Recent Labs Lab 06/23/13 1628 06/23/13 2143 06/24/13 0706 06/24/13 1124 06/24/13 1222  GLUCAP 96 105* 123* 111* 133*    Recent Results (from the past 240 hour(s))  CLOSTRIDIUM DIFFICILE BY PCR     Status: None   Collection Time    06/17/13  1:09 PM      Result Value Ref Range Status   C difficile by pcr  NEGATIVE  NEGATIVE Final  CULTURE, BLOOD (ROUTINE X 2)     Status: None   Collection Time    06/18/13  9:15 AM      Result Value Ref Range Status   Specimen Description BLOOD HEMODIALYSIS FISTULA   Final   Special Requests BOTTLES DRAWN AEROBIC AND ANAEROBIC 10CC   Final   Culture  Setup Time  Final   Value: 06/18/2013 16:16     Performed at [facility]   Culture     Final   Value: NO GROWTH 5 DAYS     Performed at [facility]   Report Status 06/24/2013 FINAL   Final  CULTURE, BLOOD (ROUTINE X 2)     Status: None   Collection Time    06/18/13  9:35 AM      Result Value Ref Range Status   Specimen Description BLOOD HEMODIALYSIS FISTULA   Final   Special Requests BOTTLES DRAWN AEROBIC AND ANAEROBIC 10CC   Final   Culture  Setup Time     Final   Value: 06/18/2013 16:17     Performed at [facility]   Culture     Final   Value: NO GROWTH 5 DAYS     Performed at [facility]   Report Status 06/24/2013 FINAL   Final  CLOSTRIDIUM DIFFICILE BY PCR     Status: None   Collection Time    06/20/13  5:48 PM      Result Value Ref Range Status   C difficile by pcr NEGATIVE  NEGATIVE Final     Studies: Ct Tibia Fibula Right Wo Contrast  06/23/2013   CLINICAL DATA:  Lower extremity gangrene. Necrotizing fasciitis. Incision and drainage and right below-the-knee amputation. Persistent fever and stump tenderness with erythema.  EXAM: CT OF THE LOWER right EXTREMITY WITHOUT CONTRAST  TECHNIQUE: Contiguous axial images were obtained without IV contrast. Coronal and sagittal reconstructed images were created.  COMPARISON:  5/5, 5/9 2015.  FINDINGS: Below-the-knee amputation is present. There is no evidence of osteomyelitis. The osteotomy margins appear sharp.  There is a tiny amount of gas in the soft tissues around the stump, which potentially could be postsurgical. There is a large heterogeneous fluid collection compatible with hematoma along the dorsolateral  aspect and posterior aspect of the stump that measures 12 cm transverse by 7 cm AP. This is difficult to distinguish from the myocutaneous flap. The hematoma extends cranially in the posterior lateral soft tissues with another focus of clot measuring 10 cm craniocaudal by 7 cm transverse and 6 cm AP posterior to the fibula.  Aggregate dimensions of the hematoma are given in the impression.  Skin staples are present over the amputation site. There is no gas dissecting through the soft tissues. The knee joint appears within normal limits.  IMPRESSION: 1. Below-the-knee amputation with normal appearance of the osteotomy. No osteomyelitis. 2. Large (20 cm x 9 cm x 12 cm) multiloculated hematoma in the posterior leg extending from the region of the myocutaneous flap along the dorsal leg to the popliteal fossa. 3. Tiny locule of gas within the hematoma could be postsurgical. Superimposed infection of the hematoma cannot be excluded.   Electronically Signed   By: Dereck Ligas M.D.   On: 06/23/2013 13:39    Scheduled Meds: . darbepoetin (ARANESP) injection - DIALYSIS  150 mcg Intravenous Q Fri-HD  . docusate sodium  100 mg Oral BID  . feeding supplement (PRO-STAT SUGAR FREE 64)  30 mL Oral TID WC  . feeding supplement (RESOURCE BREEZE)  1 Container Oral BID BM  . ferric gluconate (FERRLECIT/NULECIT) IV  125 mg Intravenous Q M,W,F-HD  . gabapentin  100 mg Oral TID  . insulin aspart  0-20 Units Subcutaneous TID WC  . labetalol  200 mg Oral BID  . multivitamin  1 tablet Oral QHS  . piperacillin-tazobactam (ZOSYN)  IV  2.25 g Intravenous 3 times per day  . sevelamer carbonate  1,600 mg Oral TID WC  . sodium chloride  10-40 mL Intracatheter Q12H  . [START ON 06/25/2013] vancomycin  1,000 mg Intravenous Q M,W,F-HD  . warfarin  5 mg Oral ONCE-1800  . Warfarin - Pharmacist Dosing Inpatient   Does not apply q1800   Continuous Infusions: . sodium chloride 10 mL/hr at 06/22/13 2025   DVT prophylaxis:  IV  heparin on hold due to LE hematoma.   Elmarie Shiley, MD  [facility] Pager [phone]  If 7PM-7AM, please contact night-coverage [email or web address] Password [email or web address] 06/22/2013, 8:18 AM   LOS: 20 days

## 2013-06-24 NOTE — Progress Notes (Signed)
Pt gone down for I&D or right stump.

## 2013-06-24 NOTE — Progress Notes (Signed)
[facility] Progress Note  Assessment/Plan: 1. 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 AMS= fever Infec / vs pain med=tapered back pain meds /and afebrile / MS baseline now ; [facility] 5/25 pending 3 ESRD  MWF - HD in am k 3.5 this am before transfusion - no heparin HD Monday 4. RUE DVT-  anticoag per pharm,  5. Fe def Anemia - Hgb 6.8 transfused 2 units this am (3 previously), on IV Fe, Aranesp 150 - ABLA due to hematoma/anticogulation 6. HPTH- Corr Ca 9.3 P ^ 6.8. Cont calcitriol increased renvela to 2 AC ac5/30 7. HTN/volume - labetolol tolerating uf 2.77 Wed and 4 L Friday 0 post weight not done. Pre HD weight Friday was 105.8 and PM weight was 95.6!!! - hard to know what the patient truly weighs; BP up post HD yesterday; continue to titrate down; his pre BKA weight was 111  8. Protein malnutrition - Nepro, mulitivit. Renal/carb mod diet - Albumin 1.1 - discussed need for increase protein with him. States he is drinking the nepro - add prostat - may need to liberalize to regular 9. Dispo-  SNF eventually   Myriam Jacobson, PA-C [facility] Beeper [phone] 06/24/2013,12:27 PM  LOS: 22 days   Subjective:   C/o pain - just returned from surgery; RN said he got 2 units PRBC  Objective Filed Vitals:   06/24/13 0541 06/24/13 1121 06/24/13 1136 06/24/13 1147  BP:  143/64 135/66 132/67  Pulse:  95 93 95  Temp: 100.1 F (37.8 C) 98.4 F (36.9 C)  98.3 F (36.8 C)  TempSrc: Oral     Resp:  17 18 17   Height:      Weight:      SpO2:  100% 100% 100%   Physical Exam General: tearful - in pain, more cheerful than has been. Heart: RRR Gr2/6 holosys M Lungs: no wheezes or rales Abdomen: soft Extremities: left LE no edema, right BKA wrapped Dialysis Access: left AVF + bruit  Dialysis Orders: MWF [facility]  4.5h 111.5kg 2/2.5 Bath 500/800 Heparin  11000 LFA AVF needs 14 needles Calcitriol 1.25ug EPO 8000 Venofer s/p load completed 5/11    Additional Objective Labs: Basic Metabolic Panel:  Recent Labs Lab 06/20/13 0749 06/21/13 0630 06/22/13 0500 06/24/13 0747  NA 135* 133* 132* 135*  K 4.2 3.7 4.0 3.5*  CL 96 95* 94*  --   CO2 25 26 25   --   GLUCOSE 66* 45* 107* 114*  BUN 34* 19 29*  --   CREATININE 7.11* 4.92* 6.45*  --   CALCIUM 7.0* 7.3* 7.2*  --   PHOS  --   --  6.8*  --    Liver Function Tests:  Recent Labs Lab 06/22/13 0500  ALBUMIN 1.3*   Lab Results  Component Value Date   INR 1.85* 06/24/2013   INR 1.75* 06/23/2013   INR 1.77* 06/22/2013    CBC:  Recent Labs Lab 06/21/13 0630 06/22/13 0500 06/23/13 0510 06/23/13 2259 06/24/13 0515 06/24/13 0747  WBC 15.4* 14.2* 18.5* 19.2* 17.1*  --   NEUTROABS  --   --  11.0*  --   --   --   HGB 7.5* 9.3* 7.2* 7.0* 6.3* 6.8*  HCT 23.9* 28.7* 23.1* 21.5* 20.0* 20.0*  MCV 90.9 90.0 91.7 90.3 91.3  --   PLT  318 295 381 364 361  --    Blood Culture    Component Value Date/Time   SDES BLOOD HEMODIALYSIS FISTULA 06/18/2013 0935   SPECREQUEST BOTTLES DRAWN AEROBIC AND ANAEROBIC 10CC 06/18/2013 0935   CULT  Value:        BLOOD CULTURE RECEIVED NO GROWTH TO DATE CULTURE WILL BE HELD FOR 5 DAYS BEFORE ISSUING A FINAL NEGATIVE REPORT Performed at [facility] 06/18/2013 0935   REPTSTATUS PENDING 06/18/2013 0935   CBG:  Recent Labs Lab 06/23/13 1155 06/23/13 1628 06/23/13 2143 06/24/13 0706 06/24/13 1124  GLUCAP 125* 96 105* 123* 111*   Iron Studies:  Recent Labs  06/22/13 0906  IRON 23*  TIBC 118*   Studies/Results: Ct Tibia Fibula Right Wo Contrast  06/23/2013   CLINICAL DATA:  Lower extremity gangrene. Necrotizing fasciitis. Incision and drainage and right below-the-knee amputation. Persistent fever and stump tenderness with erythema.  EXAM: CT OF THE LOWER right EXTREMITY WITHOUT CONTRAST  TECHNIQUE: Contiguous axial images were obtained  without IV contrast. Coronal and sagittal reconstructed images were created.  COMPARISON:  5/5, 5/9 2015.  FINDINGS: Below-the-knee amputation is present. There is no evidence of osteomyelitis. The osteotomy margins appear sharp.  There is a tiny amount of gas in the soft tissues around the stump, which potentially could be postsurgical. There is a large heterogeneous fluid collection compatible with hematoma along the dorsolateral aspect and posterior aspect of the stump that measures 12 cm transverse by 7 cm AP. This is difficult to distinguish from the myocutaneous flap. The hematoma extends cranially in the posterior lateral soft tissues with another focus of clot measuring 10 cm craniocaudal by 7 cm transverse and 6 cm AP posterior to the fibula.  Aggregate dimensions of the hematoma are given in the impression.  Skin staples are present over the amputation site. There is no gas dissecting through the soft tissues. The knee joint appears within normal limits.  IMPRESSION: 1. Below-the-knee amputation with normal appearance of the osteotomy. No osteomyelitis. 2. Large (20 cm x 9 cm x 12 cm) multiloculated hematoma in the posterior leg extending from the region of the myocutaneous flap along the dorsal leg to the popliteal fossa. 3. Tiny locule of gas within the hematoma could be postsurgical. Superimposed infection of the hematoma cannot be excluded.   Electronically Signed   By: Dereck Ligas M.D.   On: 06/23/2013 13:39   Medications: . sodium chloride 10 mL/hr at 06/22/13 2025   . [MAR HOLD] darbepoetin (ARANESP) injection - DIALYSIS  150 mcg Intravenous Q Fri-HD  . docusate sodium  100 mg Oral BID  . [MAR HOLD] feeding supplement (RESOURCE BREEZE)  1 Container Oral BID BM  . [facility] HOLD] ferric gluconate (FERRLECIT/NULECIT) IV  125 mg Intravenous Q M,W,F-HD  . [facility] HOLD] gabapentin  100 mg Oral TID  . [MAR HOLD] insulin aspart  0-20 Units Subcutaneous TID WC  . [facility] HOLD] labetalol  200 mg Oral BID   . [MAR HOLD] multivitamin  1 tablet Oral QHS  . [MAR HOLD] piperacillin-tazobactam (ZOSYN)  IV  2.25 g Intravenous 3 times per day  . [facility] HOLD] sevelamer carbonate  1,600 mg Oral TID WC  . [MAR HOLD] sodium chloride  10-40 mL Intracatheter Q12H  . [facility] HOLD] vancomycin  1,000 mg Intravenous Q M,W,F-HD  . warfarin  5 mg Oral ONCE-1800  . [facility] HOLD] Warfarin - Pharmacist Dosing Inpatient   Does not apply [phone]

## 2013-06-24 NOTE — Progress Notes (Signed)
ANTICOAGULATION CONSULT NOTE - Follow Up Consult  Pharmacy Consult for Heparin  Indication: DVT  No Known Allergies  Patient Measurements: Height: 6\' 1"  (185.4 cm) Weight: 215 lb 9.6 oz (97.796 kg) IBW/kg (Calculated) : 79.9  Vital Signs: Temp: 100.1 F (37.8 C) (05/31 0541) Temp src: Oral (05/31 0541) BP: 165/82 mmHg (05/31 0507) Pulse Rate: 99 (05/31 0507)  Labs:  Recent Labs  06/22/13 0500 06/22/13 2325 06/23/13 0510 06/23/13 0813 06/23/13 1654 06/23/13 2259 06/24/13 0515 06/24/13 0747  HGB 9.3*  --  7.2*  --   --  7.0* 6.3* 6.8*  HCT 28.7*  --  23.1*  --   --  21.5* 20.0* 20.0*  PLT 295  --  381  --   --  364 361  --   LABPROT 20.1*  --  19.9*  --   --   --  20.8*  --   INR 1.77*  --  1.75*  --   --   --  1.85*  --   HEPARINUNFRC  --  0.23*  --  0.31 0.24*  --   --   --   CREATININE 6.45*  --   --   --   --   --   --   --    Estimated Creatinine Clearance: 18.2 ml/min (by C-G formula based on Cr of 6.45).  Assessment: 43 y/o M on heparin/warfarin for DVT. Heparin is currently on hold until notified by MD as patient having I&D and bleeding.  INR is slightly subtherapeutic at 1.85.   Goal of Therapy:  Heparin level 0.3-0.7 units/ml Monitor platelets by anticoagulation protocol: Yes INR 2-3   Plan:  - Coumadin 5 mg po today - Continue to hold hep drip until Dr. Tyrell Antonio requests restart. Was running at 2850 units/hr - D/C when INR >2 - Daily INR, and s/s of bleeding

## 2013-06-24 NOTE — Anesthesia Preprocedure Evaluation (Signed)
Anesthesia Evaluation  Patient identified by MRN, date of birth, ID band Patient awake    Reviewed: Allergy & Precautions, H&P , NPO status , Patient's Chart, lab work & pertinent test results  Airway Mallampati: II  Neck ROM: full    Dental   Pulmonary shortness of breath,          Cardiovascular hypertension, + Peripheral Vascular Disease     Neuro/Psych Depression    GI/Hepatic   Endo/Other  diabetes, Type 2Morbid obesity  Renal/GU ESRF and DialysisRenal disease     Musculoskeletal  (+) Arthritis -,   Abdominal   Peds  Hematology  (+) anemia ,   Anesthesia Other Findings   Reproductive/Obstetrics                           Anesthesia Physical Anesthesia Plan  ASA: III  Anesthesia Plan: General   Post-op Pain Management:    Induction: Intravenous  Airway Management Planned: LMA  Additional Equipment:   Intra-op Plan:   Post-operative Plan:   Informed Consent: I have reviewed the patients History and Physical, chart, labs and discussed the procedure including the risks, benefits and alternatives for the proposed anesthesia with the patient or authorized representative who has indicated his/her understanding and acceptance.     Plan Discussed with: CRNA, Anesthesiologist and Surgeon  Anesthesia Plan Comments:         Anesthesia Quick Evaluation

## 2013-06-24 NOTE — Interval H&P Note (Signed)
History and Physical Interval Note:  06/24/2013 8:21 AM  Steven Logan  has presented today for surgery, with the diagnosis of fasciotomy  The various methods of treatment have been discussed with the patient and family. After consideration of risks, benefits and other options for treatment, the patient has consented to  Procedure(s): IRRIGATION AND DEBRIDEMENT TRANSTIBIAL AMPUTATION (Right) as a surgical intervention .  [name] history has been reviewed, patient examined, no change in status, stable for surgery.  I have reviewed the patient's chart and labs.  Questions were answered to the patient's satisfaction.     Newt Minion

## 2013-06-24 NOTE — Anesthesia Postprocedure Evaluation (Signed)
Anesthesia Post Note  Patient: Steven Logan  Procedure(s) Performed: Procedure(s) (LRB): IRRIGATION AND DEBRIDEMENT and Revsion of TRANSTIBIAL AMPUTATION (Right)  Anesthesia type: General  Patient location: PACU  Post pain: Pain level controlled and Adequate analgesia  Post assessment: Post-op Vital signs reviewed, Patient's Cardiovascular Status Stable, Respiratory Function Stable, Patent Airway and Pain level controlled  Last Vitals:  Filed Vitals:   06/24/13 1121  BP: 143/64  Pulse: 95  Temp: 36.9 C  Resp: 17    Post vital signs: Reviewed and stable  Level of consciousness: awake, alert  and oriented  Complications: No apparent anesthesia complications

## 2013-06-24 NOTE — Anesthesia Procedure Notes (Signed)
Procedure Name: LMA Insertion Date/Time: 06/24/2013 10:20 AM Performed by: Julian Reil Pre-anesthesia Checklist: Patient identified, Emergency Drugs available, Suction available and Patient being monitored Patient Re-evaluated:Patient Re-evaluated prior to inductionOxygen Delivery Method: Circle system utilized Preoxygenation: Pre-oxygenation with 100% oxygen Intubation Type: IV induction LMA: LMA inserted LMA Size: 5.0 Tube type: Oral Number of attempts: 1 Placement Confirmation: positive ETCO2 and breath sounds checked- equal and bilateral Tube secured with: Tape Dental Injury: Teeth and Oropharynx as per pre-operative assessment

## 2013-06-24 NOTE — Progress Notes (Signed)
CRITICAL VALUE ALERT  Critical value received:  Hemoglobin - 6.3 Date of notification: 06/24/2013  Time of notification: 0556 Critical value read back: yes  Nurse who received alert:  Georgeanna Harrison, RN MD notified (1st page):  Fredirick Maudlin Time of first page:  0601 MD notified (2nd page): Dr. Sharol Given  Time of second page: 0609  Responding MD: Fredirick Maudlin Time MD responded: [phone]

## 2013-06-24 NOTE — Progress Notes (Signed)
Pt back from surgery.  VSS. Denies pain at this time.

## 2013-06-25 ENCOUNTER — Encounter (HOSPITAL_COMMUNITY): Payer: Self-pay | Admitting: Orthopedic Surgery

## 2013-06-25 ENCOUNTER — Other Ambulatory Visit (HOSPITAL_COMMUNITY): Payer: Self-pay | Admitting: Orthopedic Surgery

## 2013-06-25 DIAGNOSIS — T8740 Infection of amputation stump, unspecified extremity: Secondary | ICD-10-CM

## 2013-06-25 DIAGNOSIS — D62 Acute posthemorrhagic anemia: Secondary | ICD-10-CM

## 2013-06-25 DIAGNOSIS — S78119A Complete traumatic amputation at level between unspecified hip and knee, initial encounter: Secondary | ICD-10-CM

## 2013-06-25 DIAGNOSIS — Y849 Medical procedure, unspecified as the cause of abnormal reaction of the patient, or of later complication, without mention of misadventure at the time of the procedure: Secondary | ICD-10-CM

## 2013-06-25 LAB — RENAL FUNCTION PANEL
Albumin: 1.2 g/dL — ABNORMAL LOW (ref 3.5–5.2)
BUN: 37 mg/dL — ABNORMAL HIGH (ref 6–23)
CO2: 23 mEq/L (ref 19–32)
Calcium: 7 mg/dL — ABNORMAL LOW (ref 8.4–10.5)
Chloride: 95 mEq/L — ABNORMAL LOW (ref 96–112)
Creatinine, Ser: 7.85 mg/dL — ABNORMAL HIGH (ref 0.50–1.35)
GFR calc Af Amer: 9 mL/min — ABNORMAL LOW (ref >90)
GFR calc non Af Amer: 7 mL/min — ABNORMAL LOW (ref >90)
Glucose, Bld: 116 mg/dL — ABNORMAL HIGH (ref 70–99)
Phosphorus: 7.6 mg/dL — ABNORMAL HIGH (ref 2.3–4.6)
Potassium: 3.9 mEq/L (ref 3.7–5.3)
Sodium: 133 mEq/L — ABNORMAL LOW (ref 137–147)

## 2013-06-25 LAB — CBC
HCT: 18.2 % — ABNORMAL LOW (ref 39.0–52.0)
Hemoglobin: 6.1 g/dL — CL (ref 13.0–17.0)
MCH: 29.2 pg (ref 26.0–34.0)
MCHC: 33.5 g/dL (ref 30.0–36.0)
MCV: 87.1 fL (ref 78.0–100.0)
PLATELETS: 263 10*3/uL (ref 150–400)
RBC: 2.09 MIL/uL — AB (ref 4.22–5.81)
RDW: 15.6 % — ABNORMAL HIGH (ref 11.5–15.5)
WBC: 15 10*3/uL — ABNORMAL HIGH (ref 4.0–10.5)

## 2013-06-25 LAB — PROTIME-INR
INR: 1.98 — AB (ref 0.00–1.49)
Prothrombin Time: 21.9 seconds — ABNORMAL HIGH (ref 11.6–15.2)

## 2013-06-25 LAB — GLUCOSE, CAPILLARY
GLUCOSE-CAPILLARY: 100 mg/dL — AB (ref 70–99)
Glucose-Capillary: 98 mg/dL (ref 70–99)
Glucose-Capillary: 108 mg/dL — ABNORMAL HIGH (ref 70–99)

## 2013-06-25 LAB — PREPARE RBC (CROSSMATCH)

## 2013-06-25 MED ORDER — ACETAMINOPHEN 325 MG PO TABS
325.0000 mg | ORAL_TABLET | Freq: Once | ORAL | Status: AC
  Administered 2013-06-25: 325 mg via ORAL
  Filled 2013-06-25: qty 1

## 2013-06-25 MED ORDER — OXYCODONE-ACETAMINOPHEN 5-325 MG PO TABS
ORAL_TABLET | ORAL | Status: AC
  Administered 2013-06-25: 2 via ORAL
  Filled 2013-06-25: qty 2

## 2013-06-25 MED ORDER — HYDROMORPHONE HCL PF 1 MG/ML IJ SOLN
INTRAMUSCULAR | Status: AC
  Filled 2013-06-25: qty 1

## 2013-06-25 NOTE — Progress Notes (Signed)
Patient ID: Steven Logan, male   DOB: [birth year], 43 y.o.   MRN: [id number] [facility] PROGRESS NOTE  Steven Logan [id number] DOB: [birth year] DOA: 06/02/2013 PCP: Tivis Ringer, MD  Brief narrative: 43 year old male with past medical history of morbid obesity, gout, HTN, DLD, PE, Pancreatitis, DM who presented to [facility] ED 06/02/2013 with worsening right ankle swelling for 2 weeks prior to this admission. Patient was started on prednisone for gout treatment initially which did not help. His wound opened up and started to have drainage. Patient subsequently re-presented to the ER. An MRI of the ankle revealed gas-containing abscess and cellulitis. Orthopedics took him emergently to the OR for debridement. He is status post right lower extremity transtibial amputation and remains on IV Zosyn. He was diagnosed with acute nonocclusive DVT right upper extremity and is on heparin bridging and Coumadin.   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 Continue with Vancomycin  Day 3 and zosyn day24 ID following.   Fever; C diff negative. , chest x ray atelectasis... Blood culture 5-25 no growth to date. CT LE show big abscess.   Diarrhea; better after stopping laxatives.   Acute nonocclusive DVT RUE  jugular, subclavian, axillary, and brachial veins involved  Has had recent removal of HD cath from right IJ 5/10 (which has been in place since Aug 2014)  On  heparin for anticoagulation; INR monitored by pharmacy as well Discussed with Dr Alvy Bimler, she doesn't think that hyperpigmentation is related to coumadin.  heparin Gtt and coumadin on hold duet to large R LE hematoma, and anemia.  Will follow up Dr Sharol Given recomendation on when we can resume heparin, coumadin ?  Will need to monitor for  chest pain, dyspnea.   Anemia in chronic kidney disease / acute postoperative blood loss anemia  Has received 2 units PRBC blood transfusion 06/18/2013. Hb drop to 6. CT with  Large (20 cm x 9 cm x 12 cm) multiloculated hematoma in the posterior leg extending from the region of the myocutaneous flap along the dorsal leg to the popliteal fossa. Received 2 units on 5-31. One unit the night of 5-31.  Heparin is on hold. Coumadin on hold.  Hb this morning at 6.  Plan to received 2 units of PRBC 6-1 during dialysis. Repeat HB tonight. Might need FFP if hb continue to decrease. High risk for thromboembolic event with recent DVT RUE.   Encephalopathy, toxic  -     Improved. Speech clear.    End stage renal disease on HD M/W/F   Per renal management   Hypertension   Stable.   DM / Hyperglycemia   A1c 7.0; good glycemic control   Had hypoglycemic episodes, lantus was discontinue.   Obesity - morbid  Body mass index is 28.69  HPTH  Continue Calcitrol and renvela   Diarrhea; improved after stopping laxatives.   Code Status: full code Family Communication: no family at the bedside  Disposition Plan: pending clinical improvement.     Consultants:   Ortho   Nephro   Procedures:   I and D on 06/03/2013   IRRIGATION AND DEBRIDEMENT EXTREMITY extensive excision of muscle fascia and soft tissue right leg 06/06/13   Transtibial amputation 5/19   ID 5-31.   Antibiotics:   Vanc 5/9 >  5/17 ---5-29  Zosyn 5/9 > ---5-29  HPI/Subjective: Feeling better today.   Objective: Filed Vitals:   06/25/13 1230 06/25/13 1258 06/25/13 1355 06/25/13 1735  BP: 146/83 138/81 185/79 143/77  Pulse: 91 93 100 100  Temp:  97.4 F (36.3 C) 99.8 F (37.7 C) 99.3 F (37.4 C)  TempSrc:  Oral Oral Oral  Resp: 18 20 22 20   Height:      Weight:  102.1 kg (225 lb 1.4 oz)    SpO2:  92% 97% 96%    Intake/Output Summary (Last 24 hours) at 06/25/13 1815 Last data filed at 06/25/13 1620   Gross per 24 hour  Intake   1350 ml  Output   2737 ml  Net  -1387 ml    Exam:   General:  Pt is alert, not in acute distress  Cardiovascular: Regular rate and rhythm, S1/S2 appreciated   Respiratory: Clear to auscultation bilaterally, no wheezing  Abdomen: Soft, non tender, non distended, bowel sounds present  Extremities: right BKA with dressing.    Neuro: Grossly nonfocal  Data Reviewed: Basic Metabolic Panel:  Recent Labs Lab 06/19/13 0555 06/20/13 0749 06/21/13 0630 06/22/13 0500 06/24/13 0747 06/25/13 0800  NA 136* 135* 133* 132* 135* 133*  K 3.9 4.2 3.7 4.0 3.5* 3.9  CL 97 96 95* 94*  --  95*  CO2 28 25 26 25   --  23  GLUCOSE 55* 66* 45* 107* 114* 116*  BUN 23 34* 19 29*  --  37*  CREATININE 5.49* 7.11* 4.92* 6.45*  --  7.85*  CALCIUM 7.4* 7.0* 7.3* 7.2*  --  7.0*  PHOS  --   --   --  6.8*  --  7.6*   Liver Function Tests:  Recent Labs Lab 06/22/13 0500 06/25/13 0800  ALBUMIN 1.3* 1.2*   No results found for this basename: LIPASE, AMYLASE,  in the last 168 hours No results found for this basename: AMMONIA,  in the last 168 hours CBC:  Recent Labs Lab 06/23/13 0510 06/23/13 2259 06/24/13 0515 06/24/13 0747 06/24/13 1555 06/25/13 0800  WBC 18.5* 19.2* 17.1*  --  16.7* 15.0*  NEUTROABS 11.0*  --   --   --   --   --   HGB 7.2* 7.0* 6.3* 6.8* 6.5* 6.1*  HCT 23.1* 21.5* 20.0* 20.0* 19.5* 18.2*  MCV 91.7 90.3 91.3  --  87.8 87.1  PLT 381 364 361  --  285 263   Cardiac Enzymes: No results found for this basename: CKTOTAL, CKMB, CKMBINDEX, TROPONINI,  in the last 168 hours BNP: No components found with this basename: POCBNP,  CBG:  Recent Labs Lab 06/24/13 1222 06/24/13 1648 06/24/13 2126 06/25/13 1351 06/25/13 1732  GLUCAP 133* 159* 121* 98 100*    Recent Results (from the past 240 hour(s))  CLOSTRIDIUM DIFFICILE BY PCR     Status: None   Collection Time    06/17/13  1:09 PM      Result Value Ref Range Status   C difficile by pcr  NEGATIVE  NEGATIVE Final  CULTURE, BLOOD (ROUTINE X 2)     Status: None   Collection Time    06/18/13  9:15 AM      Result Value Ref Range Status   Specimen Description BLOOD HEMODIALYSIS FISTULA   Final   Special Requests BOTTLES DRAWN AEROBIC AND ANAEROBIC 10CC   Final   Culture  Setup Time     Final   Value: 06/18/2013 16:16  Performed at [facility]     Final   Value: NO GROWTH 5 DAYS     Performed at [facility]   Report Status 06/24/2013 FINAL   Final  CULTURE, BLOOD (ROUTINE X 2)     Status: None   Collection Time    06/18/13  9:35 AM      Result Value Ref Range Status   Specimen Description BLOOD HEMODIALYSIS FISTULA   Final   Special Requests BOTTLES DRAWN AEROBIC AND ANAEROBIC 10CC   Final   Culture  Setup Time     Final   Value: 06/18/2013 16:17     Performed at [facility]   Culture     Final   Value: NO GROWTH 5 DAYS     Performed at [facility]   Report Status 06/24/2013 FINAL   Final  CLOSTRIDIUM DIFFICILE BY PCR     Status: None   Collection Time    06/20/13  5:48 PM      Result Value Ref Range Status   C difficile by pcr NEGATIVE  NEGATIVE Final     Studies: No results found.  Scheduled Meds: . darbepoetin (ARANESP) injection - DIALYSIS  150 mcg Intravenous Q Fri-HD  . docusate sodium  100 mg Oral BID  . feeding supplement (PRO-STAT SUGAR FREE 64)  30 mL Oral TID WC  . feeding supplement (RESOURCE BREEZE)  1 Container Oral BID BM  . ferric gluconate (FERRLECIT/NULECIT) IV  125 mg Intravenous Q M,W,F-HD  . gabapentin  100 mg Oral TID  . HYDROmorphone      . insulin aspart  0-20 Units Subcutaneous TID WC  . labetalol  200 mg Oral BID  . multivitamin  1 tablet Oral QHS  . piperacillin-tazobactam (ZOSYN)  IV  2.25 g Intravenous 3 times per day  . sevelamer carbonate  1,600 mg Oral TID WC  . sodium chloride  10-40 mL Intracatheter Q12H   Continuous Infusions: . sodium chloride 10 mL/hr at 06/22/13 2025    DVT prophylaxis:  IV heparin on hold due to LE hematoma.   Elmarie Shiley, MD  [facility] Pager [phone]  If 7PM-7AM, please contact night-coverage [email or web address] Password [email or web address] 06/22/2013, 8:18 AM   LOS: 20 days

## 2013-06-25 NOTE — Progress Notes (Signed)
Patient ID: Steven Logan, male   DOB: 31-Dec-1970, 43 y.o.   MRN: [id number] Operative findings showed a large hematoma but no abscess. Patient had increased necrotic muscle which was debrided. Patient has insufficient soft tissue envelope to have a stable functional below the knee prosthesis. Discussed with the patient that we will need to proceed this week to surgery for an above-the-knee amputation. Patient states he will discuss this with his family today. Anticipate surgery either on Wednesday or Friday. Plan for dialysis today.

## 2013-06-25 NOTE — Progress Notes (Signed)
[facility]    Date of Admission:  06/02/2013   Total days of antibiotics 24        Day 24 piptazo        Day 2 vanco ( reinitiated           ID: ZYGMONT HORVATH is a 43 y.o. 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 Febrile  Post operatively. Underwent repeat i x d to evacuate hematoma and necrotic muscle. On 5/31  Principal Problem:   Severe sepsis Active Problems:   HTN (hypertension)   Pulmonary embolism, bilateral   Abscess of right lower leg   Encephalopathy, toxic   Anemia in chronic kidney disease   Arm edema   Leg abscess   Septic shock   Septic arthritis of ankle or foot, right   ESRD on dialysis   Hyperglycemia   Arm DVT (deep venous thromboembolism), acute    Subjective: Febrile to 102.8 yesterday post operatively after hematoma evacuation and muscle debridement. duda discussing revising stump to aka due to insufficient healing and progressive tissue devitalization.getting hd today  Medications:  . darbepoetin (ARANESP) injection - DIALYSIS  150 mcg Intravenous Q Fri-HD  . docusate sodium  100 mg Oral BID  . feeding supplement (PRO-STAT SUGAR FREE 64)  30 mL Oral TID WC  . feeding supplement (RESOURCE BREEZE)  1 Container Oral BID BM  . ferric gluconate (FERRLECIT/NULECIT) IV  125 mg Intravenous Q M,W,F-HD  . gabapentin  100 mg Oral TID  . insulin aspart  0-20 Units Subcutaneous TID WC  . labetalol  200 mg Oral BID  . multivitamin  1 tablet Oral QHS  . piperacillin-tazobactam (ZOSYN)  IV  2.25 g Intravenous 3 times per day  . sevelamer carbonate  1,600 mg Oral TID WC  . sodium chloride  10-40 mL Intracatheter Q12H  . vancomycin  1,000 mg Intravenous Q M,W,F-HD    Objective: Vital signs in last 24 hours: Temp:  [97.4 F (36.3 C)-102.8 F (39.3 C)] 98.2 F (36.8 C) (06/01  1030) Pulse Rate:  [89-128] 89 (06/01 1030) Resp:  [16-22] 18 (06/01 1030) BP: (106-169)/(60-98) 130/70 mmHg (06/01 1030) SpO2:  [94 %-100 %] 94 % (06/01 0512) Weight:  [212 lb (96.163 kg)-230 lb 6.1 oz (104.5 kg)] 230 lb 6.1 oz (104.5 kg) (06/01 0758) Physical Exam  Constitutional: He is oriented to person, place, and time. He appears well-developed and well-nourished. No distress.  HENT:  Mouth/Throat: Oropharynx is clear and moist. No oropharyngeal exudate.  Cardiovascular: Normal rate, regular rhythm and normal heart sounds. Exam reveals no gallop and no friction rub.  No murmur heard.  Pulmonary/Chest: Effort normal and breath sounds normal. No respiratory distress. He has no wheezes.  Abdominal: Soft. Bowel sounds are normal. He exhibits no distension. There is no tenderness.  Lymphadenopathy:  He has no cervical adenopathy.  Ext: right bka wrapped, lower leg warm to touch Skin: Skin is warm and dry. No rash noted. No erythema.  Psychiatric: He has a normal mood and affect. His behavior is normal.    Lab Results  Recent Labs  06/24/13 0747 06/24/13 1555 06/25/13 0800  WBC  --  16.7* 15.0*  HGB 6.8* 6.5* 6.1*  HCT 20.0* 19.5* 18.2*  NA 135*  --  133*  K 3.5*  --  3.9  CL  --   --  95*  CO2  --   --  23  BUN  --   --  37*  CREATININE  --   --  7.85*   Liver Panel  Recent Labs  06/25/13 0800  ALBUMIN 1.2*    Microbiology: 5/11 right ankle wound cx: MSSA 5/25 blood cx: NGTD Studies/Results: Ct Tibia Fibula Right Wo Contrast  06/23/2013   CLINICAL DATA:  Lower extremity gangrene. Necrotizing fasciitis. Incision and drainage and right below-the-knee amputation. Persistent fever and stump tenderness with erythema.  EXAM: CT OF THE LOWER right EXTREMITY WITHOUT CONTRAST  TECHNIQUE: Contiguous axial images were obtained without IV contrast. Coronal and sagittal reconstructed images were created.  COMPARISON:  5/5, 5/9 2015.  FINDINGS: Below-the-knee amputation is  present. There is no evidence of osteomyelitis. The osteotomy margins appear sharp.  There is a tiny amount of gas in the soft tissues around the stump, which potentially could be postsurgical. There is a large heterogeneous fluid collection compatible with hematoma along the dorsolateral aspect and posterior aspect of the stump that measures 12 cm transverse by 7 cm AP. This is difficult to distinguish from the myocutaneous flap. The hematoma extends cranially in the posterior lateral soft tissues with another focus of clot measuring 10 cm craniocaudal by 7 cm transverse and 6 cm AP posterior to the fibula.  Aggregate dimensions of the hematoma are given in the impression.  Skin staples are present over the amputation site. There is no gas dissecting through the soft tissues. The knee joint appears within normal limits.  IMPRESSION: 1. Below-the-knee amputation with normal appearance of the osteotomy. No osteomyelitis. 2. Large (20 cm x 9 cm x 12 cm) multiloculated hematoma in the posterior leg extending from the region of the myocutaneous flap along the dorsal leg to the popliteal fossa. 3. Tiny locule of gas within the hematoma could be postsurgical. Superimposed infection of the hematoma cannot be excluded.   Electronically Signed   By: Dereck Ligas M.D.   On: 06/23/2013 13:39     Assessment/Plan: On going fever/leukocytosis = likely due to right stump ongoing infection. He had hematoma and muscle debridement yesterday. No cultures sent to determine if ongoing infection or proper source control achieved. Will follow cbc and temp curve over the next few days. Patient is going back to or for revision to aka so that should be sufficient source control of deep tissue infection from mssa  MSSA infection = will continue with piptazo for now and narrow to cefazolin after aka surgery. Will d/c [facility] Cell: [phone] Pager:  [phone]  06/25/2013, 10:44 AM

## 2013-06-25 NOTE — Progress Notes (Signed)
OT Cancellation Note  Patient Details Name: Steven Logan MRN: [id number] DOB: October 27, 1970   Cancelled Treatment:    Reason Eval/Treat Not Completed: Patient at procedure or test/ unavailable. Pt in hemodialysis this morning, will re attempt later today as time allows/as appropriate  Mosetta Putt 06/25/2013, 9:41 AM

## 2013-06-25 NOTE — Progress Notes (Signed)
PT Cancellation Note  Patient Details Name: RAMEEN ALPERS MRN: [id number] DOB: 1970-05-12   Cancelled Treatment:    Reason Eval/Treat Not Completed: Medical issues which prohibited therapy;Patient at procedure or test/unavailable. Patient in HD at this time and also with Hgb 6.5. Will follow up as appropriate   Tonia Brooms Scott Vanderveer 06/25/2013, 11:25 AM

## 2013-06-25 NOTE — Progress Notes (Signed)
[facility] Progress Note   Subjective: In good spirits  Filed Vitals:   06/25/13 1000 06/25/13 1015 06/25/13 1022 06/25/13 1030  BP: 115/68 116/61 126/67 130/70  Pulse: 93 104 91 89  Temp: 98.3 F (36.8 C) 98.1 F (36.7 C) 98.8 F (37.1 C) 98.2 F (36.8 C)  TempSrc: Oral Oral Oral Oral  Resp: 18 16 18 18   Height:      Weight:      SpO2:       Exam: No distress, on HD Chest clear bilat RRR 2/6 holosys M Abd soft, NTND R BKA wrapped, L leg no edema Neuro is nf, Ox3 L AVF +bruit  HD: [facility]WF [facility] 4.5h 111.5kg 2/2.5 Bath 500/800 Heparin 11000 LFA AVF use 14 needles Calcitriol 1.25ug EPO 8000 Venofer s/p load completed 5/11         Assessment: 1 BKA R leg 5/19 (MSSA ankle joint, heel and R lower leg infection)- on IV vanc and zosyn now 2 Evacuation R stump hematoma 3 Anemia d/t ABL/ esrd- transfusing now, on aranesp and IV Fe 4 HTPH- cont vit D, renvela 5 RUE DVT on AC per pharm 6 HTN/vol below prior dry wt, BP ok, euvolemic on exam, on labetalol po   Plan- HD today    Kelly Splinter MD  pager [phone]    cell [phone]  06/25/2013, 10:46 AM     Recent Labs Lab 06/21/13 0630 06/22/13 0500 06/24/13 0747 06/25/13 0800  NA 133* 132* 135* 133*  K 3.7 4.0 3.5* 3.9  CL 95* 94*  --  95*  CO2 26 25  --  23  GLUCOSE 45* 107* 114* 116*  BUN 19 29*  --  37*  CREATININE 4.92* 6.45*  --  7.85*  CALCIUM 7.3* 7.2*  --  7.0*  PHOS  --  6.8*  --  7.6*    Recent Labs Lab 06/22/13 0500 06/25/13 0800  ALBUMIN 1.3* 1.2*    Recent Labs Lab 06/23/13 0510  06/24/13 0515 06/24/13 0747 06/24/13 1555 06/25/13 0800  WBC 18.5*  < > 17.1*  --  16.7* 15.0*  NEUTROABS 11.0*  --   --   --   --   --   HGB 7.2*  < > 6.3* 6.8* 6.5* 6.1*  HCT 23.1*  < > 20.0* 20.0* 19.5* 18.2*  MCV 91.7  < > 91.3  --  87.8 87.1  PLT 381  < > 361  --  285 263  < > = values in this interval not displayed. . darbepoetin (ARANESP) injection - DIALYSIS  150 mcg Intravenous Q  Fri-HD  . docusate sodium  100 mg Oral BID  . feeding supplement (PRO-STAT SUGAR FREE 64)  30 mL Oral TID WC  . feeding supplement (RESOURCE BREEZE)  1 Container Oral BID BM  . ferric gluconate (FERRLECIT/NULECIT) IV  125 mg Intravenous Q M,W,F-HD  . gabapentin  100 mg Oral TID  . insulin aspart  0-20 Units Subcutaneous TID WC  . labetalol  200 mg Oral BID  . multivitamin  1 tablet Oral QHS  . piperacillin-tazobactam (ZOSYN)  IV  2.25 g Intravenous 3 times per day  . sevelamer carbonate  1,600 mg Oral TID WC  . sodium chloride  10-40 mL Intracatheter Q12H  . vancomycin  1,000 mg Intravenous Q M,W,F-HD   . sodium chloride 10 mL/hr at 06/22/13 2025   sodium chloride, sodium chloride, sodium chloride, sodium chloride, acetaminophen, acetaminophen, feeding supplement (NEPRO CARB STEADY), heparin, HYDROmorphone (DILAUDID)  injection, lidocaine (PF), lidocaine-prilocaine, methocarbamol (ROBAXIN) IV, methocarbamol, metoCLOPramide (REGLAN) injection, metoCLOPramide, ondansetron (ZOFRAN) IV, ondansetron, oxyCODONE-acetaminophen, pentafluoroprop-tetrafluoroeth, sodium chloride

## 2013-06-25 NOTE — Progress Notes (Addendum)
ANTIBIOTIC/ANTICOAGULATION CONSULT NOTE - Follow-Up  Pharmacy Consult for vancomycin and Zosyn (heparin/coumadin on hold) Indication: gangrene RLE  No Known Allergies  Patient Measurements: Height: 6\' 1"  (185.4 cm) Weight: 230 lb 6.1 oz (104.5 kg) IBW/kg (Calculated) : 79.9 Adjusted Body Weight: 90kg  Vital Signs: Temp: 98.1 F (36.7 C) (06/01 1015) Temp src: Oral (06/01 1015) BP: 116/61 mmHg (06/01 1015) Pulse Rate: 93 (06/01 1000) Intake/Output from previous day: 05/31 0701 - 06/01 0700 In: 1800 [P.O.:460; I.V.:520; Blood:670; IV Piggyback:150] Out: 200 [Blood:200] Intake/Output from this shift: Total I/O In: 335 [Blood:335] Out: 275 [Urine:275]  Labs:  Recent Labs  06/24/13 0515 06/24/13 0747 06/24/13 1555 06/25/13 0800  WBC 17.1*  --  16.7* 15.0*  HGB 6.3* 6.8* 6.5* 6.1*  PLT 361  --  285 263  CREATININE  --   --   --  7.85*   Estimated Creatinine Clearance: 15.4 ml/min (by C-G formula based on Cr of 7.85). No results found for this basename: Letta Median, VANCORANDOM, GENTTROUGH, GENTPEAK, GENTRANDOM, TOBRATROUGH, TOBRAPEAK, TOBRARND, AMIKACINPEAK, AMIKACINTROU, AMIKACIN,  in the last 72 hours   Lab Results  Component Value Date   INR 1.98* 06/25/2013   INR 1.85* 06/24/2013   INR 1.75* 06/23/2013     Microbiology: Recent Results (from the past 720 hour(s))  CULTURE, BLOOD (ROUTINE X 2)     Status: None   Collection Time    06/02/13 10:50 PM      Result Value Ref Range Status   Specimen Description BLOOD LEFT ARM   Final   Special Requests BOTTLES DRAWN AEROBIC AND ANAEROBIC 10CC EACH   Final   Culture  Setup Time     Final   Value: 06/03/2013 12:30     Performed at [facility]   Culture     Final   Value: NO GROWTH 5 DAYS     Performed at [facility]   Report Status 06/09/2013 FINAL   Final  CULTURE, BLOOD (ROUTINE X 2)     Status: None   Collection Time    06/02/13 10:55 PM      Result Value Ref Range Status    Specimen Description BLOOD LEFT HAND   Final   Special Requests BOTTLES DRAWN AEROBIC ONLY 10CC   Final   Culture  Setup Time     Final   Value: 06/03/2013 12:30     Performed at [facility]   Culture     Final   Value: NO GROWTH 5 DAYS     Performed at [facility]   Report Status 06/09/2013 FINAL   Final  CULTURE, ROUTINE-ABSCESS     Status: None   Collection Time    06/03/13 12:20 AM      Result Value Ref Range Status   Specimen Description ABSCESS RIGHT FOOT   Final   Special Requests ANKLE PATIENT ON FOLLOWING ZOSYN VANCOMYCIN   Final   Gram Stain     Final   Value: ABUNDANT WBC PRESENT, PREDOMINANTLY PMN     RARE SQUAMOUS EPITHELIAL CELLS PRESENT     ABUNDANT GRAM POSITIVE COCCI IN PAIRS     IN CLUSTERS     Performed at [facility]   Culture     Final   Value: MODERATE STAPHYLOCOCCUS AUREUS     Note: RIFAMPIN AND GENTAMICIN SHOULD NOT BE USED AS SINGLE DRUGS FOR TREATMENT OF STAPH INFECTIONS. This organism is presumed to be Clindamycin resistant based on detection of inducible  Clindamycin resistance.     Performed at [facility]   Report Status 06/05/2013 FINAL   Final   Organism ID, Bacteria STAPHYLOCOCCUS AUREUS   Final  ANAEROBIC CULTURE     Status: None   Collection Time    06/03/13 12:20 AM      Result Value Ref Range Status   Specimen Description ABSCESS RIGHT FOOT   Final   Special Requests ANKLE PATIENT ON FOLLOWING ZOSYN VANCOMYCIN   Final   Gram Stain     Final   Value: ABUNDANT WBC PRESENT, PREDOMINANTLY PMN     RARE SQUAMOUS EPITHELIAL CELLS PRESENT     ABUNDANT GRAM POSITIVE COCCI IN PAIRS     IN CLUSTERS     Performed at [facility]   Culture     Final   Value: NO ANAEROBES ISOLATED     Performed at [facility]   Report Status 06/08/2013 FINAL   Final  MRSA PCR SCREENING     Status: None   Collection Time    06/03/13  3:35 AM      Result Value Ref Range Status   MRSA by PCR NEGATIVE  NEGATIVE Final    Comment:            The GeneXpert MRSA Assay (FDA     approved for NASAL specimens     only), is one component of a     comprehensive MRSA colonization     surveillance program. It is not     intended to diagnose MRSA     infection nor to guide or     monitor treatment for     MRSA infections.  CULTURE, ROUTINE-ABSCESS     Status: None   Collection Time    06/04/13  5:59 PM      Result Value Ref Range Status   Specimen Description ABSCESS ANKLE RIGHT   Final   Special Requests PATIENT ON FOLLOWING VANCOMYCIN, UNASYN   Final   Gram Stain     Final   Value: ABUNDANT WBC PRESENT,BOTH PMN AND MONONUCLEAR     NO SQUAMOUS EPITHELIAL CELLS SEEN     FEW GRAM POSITIVE COCCI     IN PAIRS IN CLUSTERS     Performed at [facility]   Culture     Final   Value: MODERATE STAPHYLOCOCCUS AUREUS     Note: RIFAMPIN AND GENTAMICIN SHOULD NOT BE USED AS SINGLE DRUGS FOR TREATMENT OF STAPH INFECTIONS. This organism is presumed to be Clindamycin resistant based on detection of inducible Clindamycin resistance.     Performed at [facility]   Report Status 06/07/2013 FINAL   Final   Organism ID, Bacteria STAPHYLOCOCCUS AUREUS   Final  ANAEROBIC CULTURE     Status: None   Collection Time    06/04/13  5:59 PM      Result Value Ref Range Status   Specimen Description ABSCESS ANKLE RIGHT   Final   Special Requests PATIENT ON FOLLOWING VANCOMYCIN, UNASYN   Final   Gram Stain     Final   Value: ABUNDANT WBC PRESENT,BOTH PMN AND MONONUCLEAR     NO SQUAMOUS EPITHELIAL CELLS SEEN     FEW GRAM POSITIVE COCCI     IN PAIRS IN CLUSTERS     Performed at [facility]   Culture     Final   Value: NO ANAEROBES ISOLATED     Performed at [facility]   Report  Status 06/09/2013 FINAL   Final  ANAEROBIC CULTURE     Status: None   Collection Time    06/04/13  6:07 PM      Result Value Ref Range Status   Specimen Description ABSCESS RIGHT LEG   Final   Special Requests     Final    Value: PATIENT ON FOLLOWING VANCOMYCIN UNASYN CULTURES FROM RIGHT CALF   Gram Stain     Final   Value: FEW WBC PRESENT,BOTH PMN AND MONONUCLEAR     NO SQUAMOUS EPITHELIAL CELLS SEEN     FEW GRAM POSITIVE COCCI     IN PAIRS     Performed at [facility]   Culture     Final   Value: NO ANAEROBES ISOLATED     Performed at [facility]   Report Status 06/09/2013 FINAL   Final  CULTURE, ROUTINE-ABSCESS     Status: None   Collection Time    06/04/13  6:07 PM      Result Value Ref Range Status   Specimen Description ABSCESS RIGHT LEG   Final   Special Requests     Final   Value: PATIENT ON FOLLOWING VANCOMYCIN UNASYN CULTURES FROM RIGHT CALF   Gram Stain     Final   Value: RARE WBC PRESENT,BOTH PMN AND MONONUCLEAR     NO SQUAMOUS EPITHELIAL CELLS SEEN     FEW GRAM POSITIVE COCCI     IN PAIRS     Performed at [facility]   Culture     Final   Value: MODERATE STAPHYLOCOCCUS AUREUS     Note: SUSCEPTIBILITIES PERFORMED ON PREVIOUS CULTURE WITHIN THE LAST 5 DAYS.     Performed at [facility]   Report Status 06/07/2013 FINAL   Final  CULTURE, BLOOD (ROUTINE X 2)     Status: None   Collection Time    06/10/13  6:29 AM      Result Value Ref Range Status   Specimen Description BLOOD   Final   Special Requests     Final   Value: BOTTLES DRAWN AEROBIC ONLY LEFT INTERNAL JUGULAR TLC DISTAL PORT  10CC   Culture  Setup Time     Final   Value: 06/10/2013 14:40     Performed at [facility]   Culture     Final   Value: NO GROWTH 5 DAYS     Performed at [facility]   Report Status 06/15/2013 FINAL   Final  CLOSTRIDIUM DIFFICILE BY PCR     Status: None   Collection Time    06/17/13  1:09 PM      Result Value Ref Range Status   C difficile by pcr NEGATIVE  NEGATIVE Final  CULTURE, BLOOD (ROUTINE X 2)     Status: None   Collection Time    06/18/13  9:15 AM      Result Value Ref Range Status   Specimen Description BLOOD HEMODIALYSIS  FISTULA   Final   Special Requests BOTTLES DRAWN AEROBIC AND ANAEROBIC 10CC   Final   Culture  Setup Time     Final   Value: 06/18/2013 16:16     Performed at [facility]   Culture     Final   Value: NO GROWTH 5 DAYS     Performed at [facility]   Report Status 06/24/2013 FINAL   Final  CULTURE, BLOOD (ROUTINE X 2)     Status: None  Collection Time    06/18/13  9:35 AM      Result Value Ref Range Status   Specimen Description BLOOD HEMODIALYSIS FISTULA   Final   Special Requests BOTTLES DRAWN AEROBIC AND ANAEROBIC 10CC   Final   Culture  Setup Time     Final   Value: 06/18/2013 16:17     Performed at [facility]   Culture     Final   Value: NO GROWTH 5 DAYS     Performed at [facility]   Report Status 06/24/2013 FINAL   Final  CLOSTRIDIUM DIFFICILE BY PCR     Status: None   Collection Time    06/20/13  5:48 PM      Result Value Ref Range Status   C difficile by pcr NEGATIVE  NEGATIVE Final   Assessment: 50 YOM with ESRD on HD with septic ankle and abscess on RLE s/p amputation with persistent fever. ID changed antibiotics from cefazolin back to vancomycin and Zosyn as he has multiple possible sources for fever. Last HD session was 5/29- tolerated a full 4 hour session with BFR 426mL/min, and received appropriate dose of vancomycin.  Currently back in HD. WBC 15, Tmax/24h 102.8.  Also previously on coumadin with heparin bridging for acute non-occlusive RUE DVT, hx PE.  Anticoagulation is currently on hold due to large RLE hematoma and anemia.  Last warfarin dose 5/30.  INR 1.98 today.  Goal of Therapy:  Pre-HD vancomycin level 15-3mcg/mL  Plan:  1. Zosyn 2.25g IV q8h 2. Vancomycin 1000mg  IV qHD (MWF), will need dose at end of HD today. 3. Follow new c/s, workup for sources of fever, de-escalation when able, HD schedule, and levels and needed 4. Will continue to monitor for plans to resume anticoagulation when CBC/bleeding  stabilizes.  Uvaldo Rising, BCPS  Clinical Pharmacist Pager [phone]  06/25/2013 10:23 AM

## 2013-06-25 NOTE — Progress Notes (Signed)
Patient has an order to transfuse 1 unit of RBC for the night. Vital signs were taken at 2128 prior to BT, patient was febrile with temperature of 102.8. PRN Tylenol 2 tablets were given at 2139. At around 12 midnight, temperature was rechecked, still febrile at 100.4. NP T. Rogue Bussing was paged and informed, ordered to give another dose of Tylenol 1 tablet and proceed with BT. Carried out. Blood transfusion was started with patient having a temperature of 100.6 but afebrile during the whole duration of the BT. No BT reaction noted. Will closely monitor patient. BT ended at 0512, patient afebrile. Evern Core, RN

## 2013-06-25 NOTE — Procedures (Signed)
I was present at this dialysis session, have reviewed the session itself and made  appropriate changes  Kelly Splinter MD (pgr) [phone]    [phone] 06/25/2013, 11:40 AM

## 2013-06-25 NOTE — Progress Notes (Signed)
OT Cancellation Note  Patient Details Name: Steven Logan MRN: [id number] DOB: August 07, 1970   Cancelled Treatment:    Reason Eval/Treat Not Completed: Medical issues which prohibited therapy;Fatigue/lethargy limiting ability to participate. Pt with low Hgb, has just returned from dialysis and has declined any therapy for the rest of the day. Will re attempt tomorrow  Mosetta Putt 06/25/2013, 2:02 PM

## 2013-06-26 LAB — CBC
HCT: 21.1 % — ABNORMAL LOW (ref 39.0–52.0)
HCT: 22.2 % — ABNORMAL LOW (ref 39.0–52.0)
Hemoglobin: 7.1 g/dL — ABNORMAL LOW (ref 13.0–17.0)
Hemoglobin: 7.4 g/dL — ABNORMAL LOW (ref 13.0–17.0)
MCH: 29.2 pg (ref 26.0–34.0)
MCH: 29.5 pg (ref 26.0–34.0)
MCHC: 33.3 g/dL (ref 30.0–36.0)
MCHC: 33.6 g/dL (ref 30.0–36.0)
MCV: 87.6 fL (ref 78.0–100.0)
MCV: 87.7 fL (ref 78.0–100.0)
PLATELETS: 287 10*3/uL (ref 150–400)
PLATELETS: 285 10*3/uL (ref 150–400)
RBC: 2.41 MIL/uL — ABNORMAL LOW (ref 4.22–5.81)
RBC: 2.53 MIL/uL — AB (ref 4.22–5.81)
RDW: 15.3 % (ref 11.5–15.5)
RDW: 15.4 % (ref 11.5–15.5)
WBC: 14.5 10*3/uL — ABNORMAL HIGH (ref 4.0–10.5)
WBC: 14 10*3/uL — AB (ref 4.0–10.5)

## 2013-06-26 LAB — PREPARE RBC (CROSSMATCH)

## 2013-06-26 LAB — GLUCOSE, CAPILLARY
GLUCOSE-CAPILLARY: 111 mg/dL — AB (ref 70–99)
GLUCOSE-CAPILLARY: 93 mg/dL (ref 70–99)
GLUCOSE-CAPILLARY: 119 mg/dL — AB (ref 70–99)
Glucose-Capillary: 108 mg/dL — ABNORMAL HIGH (ref 70–99)

## 2013-06-26 LAB — PROTIME-INR
INR: 2.14 — ABNORMAL HIGH (ref 0.00–1.49)
PROTHROMBIN TIME: 23.2 s — AB (ref 11.6–15.2)

## 2013-06-26 MED ORDER — CHLORHEXIDINE GLUCONATE 4 % EX LIQD
60.0000 mL | Freq: Once | CUTANEOUS | Status: DC
  Filled 2013-06-26: qty 60

## 2013-06-26 MED ORDER — VITAMIN K1 10 MG/ML IJ SOLN
10.0000 mg | Freq: Once | INTRAVENOUS | Status: AC
  Administered 2013-06-26: 10 mg via INTRAVENOUS
  Filled 2013-06-26: qty 1

## 2013-06-26 NOTE — Progress Notes (Signed)
[facility] Progress Note   Subjective: No complaints  Filed Vitals:   06/26/13 0925 06/26/13 0930 06/26/13 1045 06/26/13 1145  BP: 151/85 127/85 119/66 124/84  Pulse: 85 85  84  Temp: 98.7 F (37.1 C) 98.5 F (36.9 C) 98.7 F (37.1 C) 98.7 F (37.1 C)  TempSrc:      Resp: 16 15  16   Height:      Weight:      SpO2: 95% 98%  95%   Exam: No distress, alert, eating lunch Chest clear bilat RRR 2/6 holosys M Abd soft, NTND R BKA wrapped, L leg no edema Neuro is nf, Ox3 L AVF +bruit  HD: [facility] 4.5h 111.5kg 2/2.5 Bath 500/800 Heparin-- none for now due to anemia/ hematoma (was on 11000) L AVF use 14 needles Calcitriol 1.25ug EPO 8000 Venofer s/p load completed 5/11         Assessment: 1 BKA R leg 5/19 (MSSA R septic ankle, heel osteo and R lower leg infection)- on IV vanc and zosyn now 2 S/P revision R BKA w evacuation of large hematoma  3 Anemia d/t ABL/ esrd- getting more prbc's today, cont aranesp and IV Fe 4 HTPH- cont vit D, renvela 5 RUE DVT- anticoagulation on hold due to large stump hematoma 6 HTN/vol below prior dry wt, BP ok, euvolemic on exam, on labetalol po 7 ESRD on HD no heparin for now  Plan- to OR tomorrow for R AKA; will probably postpone tomorrow's HD to Thursday unless there is a pressing issue    Kelly Splinter MD  pager [phone]    cell [phone]  06/26/2013, 12:54 PM     Recent Labs Lab 06/21/13 0630 06/22/13 0500 06/24/13 0747 06/25/13 0800  NA 133* 132* 135* 133*  K 3.7 4.0 3.5* 3.9  CL 95* 94*  --  95*  CO2 26 25  --  23  GLUCOSE 45* 107* 114* 116*  BUN 19 29*  --  37*  CREATININE 4.92* 6.45*  --  7.85*  CALCIUM 7.3* 7.2*  --  7.0*  PHOS  --  6.8*  --  7.6*    Recent Labs Lab 06/22/13 0500 06/25/13 0800  ALBUMIN 1.3* 1.2*    Recent Labs Lab 06/23/13 0510  06/25/13 0800 06/26/13 0030 06/26/13 0500  WBC 18.5*  < > 15.0* 14.0* 14.5*  NEUTROABS 11.0*  --   --   --   --   HGB 7.2*  < > 6.1* 7.1* 7.4*   HCT 23.1*  < > 18.2* 21.1* 22.2*  MCV 91.7  < > 87.1 87.6 87.7  PLT 381  < > 263 285 287  < > = values in this interval not displayed. . darbepoetin (ARANESP) injection - DIALYSIS  150 mcg Intravenous Q Fri-HD  . docusate sodium  100 mg Oral BID  . feeding supplement (PRO-STAT SUGAR FREE 64)  30 mL Oral TID WC  . feeding supplement (RESOURCE BREEZE)  1 Container Oral BID BM  . ferric gluconate (FERRLECIT/NULECIT) IV  125 mg Intravenous Q M,W,F-HD  . gabapentin  100 mg Oral TID  . insulin aspart  0-20 Units Subcutaneous TID WC  . labetalol  200 mg Oral BID  . multivitamin  1 tablet Oral QHS  . phytonadione (VITAMIN K) IV  10 mg Intravenous Once  . piperacillin-tazobactam (ZOSYN)  IV  2.25 g Intravenous 3 times per day  . sevelamer carbonate  1,600 mg Oral TID WC  . sodium chloride  10-40  mL Intracatheter Q12H   . sodium chloride 10 mL/hr at 06/22/13 2025   sodium chloride, sodium chloride, acetaminophen, acetaminophen, HYDROmorphone (DILAUDID) injection, methocarbamol (ROBAXIN) IV, methocarbamol, metoCLOPramide (REGLAN) injection, metoCLOPramide, ondansetron (ZOFRAN) IV, ondansetron, oxyCODONE-acetaminophen, sodium chloride

## 2013-06-26 NOTE — Progress Notes (Signed)
Patient ID: [name], male   DOB: 08-Dec-1970, 43 y.o.   MRN: [id number] [facility] PROGRESS NOTE  SHERROD HATCHELL [id number] DOB: 16-Oct-1970 DOA: 06/02/2013 PCP: Tivis Ringer, MD  Brief narrative: 42 year old male with past medical history of morbid obesity, gout, HTN, DLD, PE, Pancreatitis, DM who presented to [facility] ED 06/02/2013 with worsening right ankle swelling for 2 weeks prior to this admission. Patient was started on prednisone for gout treatment initially which did not help. His wound opened up and started to have drainage. Patient subsequently re-presented to the ER. An MRI of the ankle revealed gas-containing abscess and cellulitis. Orthopedics took him emergently to the OR for debridement. He is status post right lower extremity transtibial amputation and remains on IV Zosyn. He continue to have fever post  surgery. ID was consulted. Patient was also started on Vancomycin.  He was diagnosed with acute nonocclusive DVT right upper extremity. He had repeated CT LE due to persistent fever which show a large hematoma. Heparin has been on hold because he developed a large Right BKA hematoma. He has received multiple Blood transfusion. Plan is for above Knee amputation 6-3. Will give one dose of vitamin K.   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 Continue with Vancomycin  Day 4 and zosyn day 26 ID following.  For Above Knee amputation on 6-3 patient with ischemic tissue and does not have a viable transtibial amputation per Dr Sharol Given.    Fever; C diff negative. , chest x ray atelectasis... Blood culture 5-25 no growth to date. CT LE show big abscess.   Diarrhea; better after stopping laxatives.   Acute nonocclusive DVT RUE  jugular, subclavian, axillary, and brachial  veins involved  Has had recent removal of HD cath from right IJ 5/10 (which has been in place since Aug 2014)  Discussed with Dr Alvy Bimler, she doesn't think that hyperpigmentation is related to coumadin.  Heparin Gtt and coumadin on hold duet to large R LE hematoma, and acute blood loss anemia. Will follow up Dr Sharol Given recomendation on when we can resume heparin, coumadin ?  Will need to monitor for chest pain, dyspnea.   Anemia in chronic kidney disease / acute postoperative blood loss anemia  Has received 2 units PRBC blood transfusion 06/18/2013. Hb drop to 6. CT with  Large (20 cm x 9 cm x 12 cm) multiloculated hematoma in the posterior leg extending from the region of the myocutaneous flap along the dorsal leg to the popliteal fossa. Received 2 units on 5-31. One unit the night of 5-31. 2 units of PRBC 6-1 during dialysis. 2 units 6-2.  Heparin is on hold. Coumadin on hold.  Will give one dose vitamin K.  High risk for thromboembolic event with recent DVT RUE.   Encephalopathy, toxic  -   Resolved.    End stage renal disease on HD M/W/F   Per renal management   Hypertension   Stable.   DM / Hyperglycemia   A1c 7.0; good glycemic control   Had hypoglycemic episodes, lantus was discontinue.   Obesity - morbid  Body mass index is 28.69  HPTH  Continue Calcitrol and renvela   Diarrhea; improved after stopping laxatives.   Code Status: full code Family Communication: no family at the bedside  Disposition Plan: pending clinical improvement.     Consultants:   Ortho   Nephro   Procedures:   I and D on 06/03/2013   IRRIGATION AND DEBRIDEMENT EXTREMITY extensive excision of muscle fascia and soft tissue right leg 06/06/13   Transtibial amputation 5/19   ID 5-31.   Antibiotics:   Vanc 5/9 >5/17 --- 5-29  Zosyn 5/9 > -  HPI/Subjective: He is tire. He denies weakness, no chest pain or dyspnea.   Objective: Filed Vitals:   06/26/13 0925 06/26/13 0930  06/26/13 1045 06/26/13 1145  BP: 151/85 127/85 119/66 124/84  Pulse: 85 85  84  Temp: 98.7 F (37.1 C) 98.5 F (36.9 C) 98.7 F (37.1 C) 98.7 F (37.1 C)  TempSrc:      Resp: 16 15  16   Height:      Weight:      SpO2: 95% 98%  95%    Intake/Output Summary (Last 24 hours) at 06/26/13 1544 Last data filed at 06/26/13 1300  Gross per 24 hour  Intake 1109.79 ml  Output      0 ml  Net 1109.79 ml    Exam:   General:  Pt is alert, not in acute distress  Cardiovascular: Regular rate and rhythm, S1/S2 appreciated   Respiratory: Clear to auscultation bilaterally, no wheezing  Abdomen: Soft, non tender, non distended, bowel sounds present  Extremities: right BKA with dressing.    Neuro: Grossly nonfocal  Data Reviewed: Basic Metabolic Panel:  Recent Labs Lab 06/20/13 0749 06/21/13 0630 06/22/13 0500 06/24/13 0747 06/25/13 0800  NA 135* 133* 132* 135* 133*  K 4.2 3.7 4.0 3.5* 3.9  CL 96 95* 94*  --  95*  CO2 25 26 25   --  23  GLUCOSE 66* 45* 107* 114* 116*  BUN 34* 19 29*  --  37*  CREATININE 7.11* 4.92* 6.45*  --  7.85*  CALCIUM 7.0* 7.3* 7.2*  --  7.0*  PHOS  --   --  6.8*  --  7.6*   Liver Function Tests:  Recent Labs Lab 06/22/13 0500 06/25/13 0800  ALBUMIN 1.3* 1.2*   No results found for this basename: LIPASE, AMYLASE,  in the last 168 hours No results found for this basename: AMMONIA,  in the last 168 hours CBC:  Recent Labs Lab 06/23/13 0510  06/24/13 0515 06/24/13 0747 06/24/13 1555 06/25/13 0800 06/26/13 0030 06/26/13 0500  WBC 18.5*  < > 17.1*  --  16.7* 15.0* 14.0* 14.5*  NEUTROABS 11.0*  --   --   --   --   --   --   --   HGB 7.2*  < > 6.3* 6.8* 6.5* 6.1* 7.1* 7.4*  HCT 23.1*  < > 20.0* 20.0* 19.5* 18.2* 21.1* 22.2*  MCV 91.7  < > 91.3  --  87.8 87.1 87.6 87.7  PLT 381  < > 361  --  285 263 285 287  < > = values in this interval not displayed. Cardiac Enzymes: No results found for this basename: CKTOTAL, CKMB, CKMBINDEX,  TROPONINI,  in the last 168 hours BNP: No components found with this basename: POCBNP,  CBG:  Recent Labs Lab 06/25/13 1351 06/25/13 1732 06/25/13 2128 06/26/13 0742 06/26/13 1216  GLUCAP 98 100* 108* 111* 108*    Recent Results (from the past 240 hour(s))  CLOSTRIDIUM DIFFICILE BY PCR     Status: None   Collection Time    06/17/13  1:09 PM  Result Value Ref Range Status   C difficile by pcr NEGATIVE  NEGATIVE Final  CULTURE, BLOOD (ROUTINE X 2)     Status: None   Collection Time    06/18/13  9:15 AM      Result Value Ref Range Status   Specimen Description BLOOD HEMODIALYSIS FISTULA   Final   Special Requests BOTTLES DRAWN AEROBIC AND ANAEROBIC 10CC   Final   Culture  Setup Time     Final   Value: 06/18/2013 16:16     Performed at [facility]   Culture     Final   Value: NO GROWTH 5 DAYS     Performed at [facility]   Report Status 06/24/2013 FINAL   Final  CULTURE, BLOOD (ROUTINE X 2)     Status: None   Collection Time    06/18/13  9:35 AM      Result Value Ref Range Status   Specimen Description BLOOD HEMODIALYSIS FISTULA   Final   Special Requests BOTTLES DRAWN AEROBIC AND ANAEROBIC 10CC   Final   Culture  Setup Time     Final   Value: 06/18/2013 16:17     Performed at [facility]   Culture     Final   Value: NO GROWTH 5 DAYS     Performed at [facility]   Report Status 06/24/2013 FINAL   Final  CLOSTRIDIUM DIFFICILE BY PCR     Status: None   Collection Time    06/20/13  5:48 PM      Result Value Ref Range Status   C difficile by pcr NEGATIVE  NEGATIVE Final     Studies: No results found.  Scheduled Meds: . [START ON 06/27/2013] chlorhexidine  60 mL Topical Once  . darbepoetin (ARANESP) injection - DIALYSIS  150 mcg Intravenous Q Fri-HD  . docusate sodium  100 mg Oral BID  . feeding supplement (PRO-STAT SUGAR FREE 64)  30 mL Oral TID WC  . feeding supplement (RESOURCE BREEZE)  1 Container Oral BID BM  . ferric  gluconate (FERRLECIT/NULECIT) IV  125 mg Intravenous Q M,W,F-HD  . gabapentin  100 mg Oral TID  . insulin aspart  0-20 Units Subcutaneous TID WC  . labetalol  200 mg Oral BID  . multivitamin  1 tablet Oral QHS  . piperacillin-tazobactam (ZOSYN)  IV  2.25 g Intravenous 3 times per day  . sevelamer carbonate  1,600 mg Oral TID WC  . sodium chloride  10-40 mL Intracatheter Q12H   Continuous Infusions: . sodium chloride 10 mL/hr at 06/22/13 2025   DVT prophylaxis:  IV heparin on hold due to LE hematoma.   Elmarie Shiley, MD  [facility] Pager [phone]  If 7PM-7AM, please contact night-coverage [email or web address] Password [email or web address] 06/22/2013, 8:18 AM   LOS: 20 days

## 2013-06-26 NOTE — Progress Notes (Signed)
Steven Fantasia, NP notified of patient's CBC results via Amion (WBC 14.0; HGB 7.1; RBC 2.41; HCT 21.1; on 06/25/13 HGB 6.10).   No new orders received.

## 2013-06-26 NOTE — Progress Notes (Addendum)
PT Cancellation Note  Patient Details Name: Steven Logan MRN: [id number] DOB: 1970-05-19   Cancelled Treatment:    Reason Eval/Treat Not Completed: Medical issues which prohibited therapy (pt  to get blood, planned for AKA 5/3.)Will need reorder post op. PT will sign off at this time .Shella Maxim Tausha Milhoan 06/26/2013, 7:55 AM Tresa Endo PT [phone]

## 2013-06-26 NOTE — Progress Notes (Signed)
UR completed 

## 2013-06-26 NOTE — Progress Notes (Signed)
Patient ID: Steven Logan, male   DOB: [birth year], 43 y.o.   MRN: [id number] Temperature and white blood cell count improving slowly. Plan for above-the-knee amputation tomorrow morning. Patient has insufficient soft tissue envelope and still ischemic tissue and does not have a viable transtibial amputation.

## 2013-06-27 ENCOUNTER — Encounter (HOSPITAL_COMMUNITY)
Admission: EM | Disposition: A | Discharge status: Skilled Nursing Facility | Payer: Self-pay | Source: Home / Self Care | Attending: Internal Medicine

## 2013-06-27 ENCOUNTER — Encounter (HOSPITAL_COMMUNITY): Payer: Self-pay | Admitting: Anesthesiology

## 2013-06-27 ENCOUNTER — Inpatient Hospital Stay (HOSPITAL_COMMUNITY): Payer: BC Managed Care – PPO | Admitting: Anesthesiology

## 2013-06-27 ENCOUNTER — Encounter (HOSPITAL_COMMUNITY): Payer: BC Managed Care – PPO | Admitting: Anesthesiology

## 2013-06-27 HISTORY — PX: AMPUTATION: SHX166

## 2013-06-27 LAB — TYPE AND SCREEN
ABO/RH(D): A POS
ANTIBODY SCREEN: NEGATIVE
UNIT DIVISION: 0
UNIT DIVISION: 0
UNIT DIVISION: 0
Unit division: 0
Unit division: 0
Unit division: 0
Unit division: 0

## 2013-06-27 LAB — CBC
HCT: 24.5 % — ABNORMAL LOW (ref 39.0–52.0)
HCT: 23.4 % — ABNORMAL LOW (ref 39.0–52.0)
Hemoglobin: 8.3 g/dL — ABNORMAL LOW (ref 13.0–17.0)
Hemoglobin: 7.8 g/dL — ABNORMAL LOW (ref 13.0–17.0)
MCH: 29.6 pg (ref 26.0–34.0)
MCH: 29.2 pg (ref 26.0–34.0)
MCHC: 33.9 g/dL (ref 30.0–36.0)
MCHC: 33.3 g/dL (ref 30.0–36.0)
MCV: 87.5 fL (ref 78.0–100.0)
MCV: 87.6 fL (ref 78.0–100.0)
Platelets: 296 10*3/uL (ref 150–400)
Platelets: 263 10*3/uL (ref 150–400)
RBC: 2.8 MIL/uL — AB (ref 4.22–5.81)
RBC: 2.67 MIL/uL — ABNORMAL LOW (ref 4.22–5.81)
RDW: 15.1 % (ref 11.5–15.5)
RDW: 15.2 % (ref 11.5–15.5)
WBC: 14.1 10*3/uL — AB (ref 4.0–10.5)
WBC: 12.2 10*3/uL — ABNORMAL HIGH (ref 4.0–10.5)

## 2013-06-27 LAB — SURGICAL PCR SCREEN
MRSA, PCR: NEGATIVE
Staphylococcus aureus: NEGATIVE

## 2013-06-27 LAB — GLUCOSE, CAPILLARY
GLUCOSE-CAPILLARY: 115 mg/dL — AB (ref 70–99)
GLUCOSE-CAPILLARY: 128 mg/dL — AB (ref 70–99)
Glucose-Capillary: 128 mg/dL — ABNORMAL HIGH (ref 70–99)
Glucose-Capillary: 103 mg/dL — ABNORMAL HIGH (ref 70–99)
Glucose-Capillary: 96 mg/dL (ref 70–99)
Glucose-Capillary: 113 mg/dL — ABNORMAL HIGH (ref 70–99)

## 2013-06-27 LAB — BASIC METABOLIC PANEL
BUN: 38 mg/dL — ABNORMAL HIGH (ref 6–23)
CO2: 24 mEq/L (ref 19–32)
Calcium: 7.3 mg/dL — ABNORMAL LOW (ref 8.4–10.5)
Chloride: 97 mEq/L (ref 96–112)
Creatinine, Ser: 7.03 mg/dL — ABNORMAL HIGH (ref 0.50–1.35)
GFR calc Af Amer: 10 mL/min — ABNORMAL LOW (ref >90)
GFR calc non Af Amer: 9 mL/min — ABNORMAL LOW (ref >90)
Glucose, Bld: 119 mg/dL — ABNORMAL HIGH (ref 70–99)
Potassium: 3.8 mEq/L (ref 3.7–5.3)
Sodium: 134 mEq/L — ABNORMAL LOW (ref 137–147)

## 2013-06-27 LAB — PROTIME-INR
INR: 1.3 (ref 0.00–1.49)
PROTHROMBIN TIME: 15.9 s — AB (ref 11.6–15.2)

## 2013-06-27 SURGERY — AMPUTATION, ABOVE KNEE
Anesthesia: General | Site: Leg Upper | Laterality: Right

## 2013-06-27 MED ORDER — FENTANYL CITRATE 0.05 MG/ML IJ SOLN
INTRAMUSCULAR | Status: AC
  Filled 2013-06-27: qty 5

## 2013-06-27 MED ORDER — ONDANSETRON HCL 4 MG PO TABS: 4.0000 mg | ORAL_TABLET | Freq: Four times a day (QID) | ORAL | Status: DC | PRN

## 2013-06-27 MED ORDER — HYDROMORPHONE HCL PF 1 MG/ML IJ SOLN
0.2500 mg | INTRAMUSCULAR | Status: DC | PRN
  Administered 2013-06-27: 1 mg via INTRAVENOUS

## 2013-06-27 MED ORDER — HYDROMORPHONE HCL PF 1 MG/ML IJ SOLN: 0.5000 mg | INTRAMUSCULAR | Status: DC | PRN

## 2013-06-27 MED ORDER — SODIUM CHLORIDE 0.9 % IV SOLN
INTRAVENOUS | Status: DC
  Administered 2013-06-27 – 2013-06-30 (×3): via INTRAVENOUS

## 2013-06-27 MED ORDER — PROPOFOL 10 MG/ML IV BOLUS
INTRAVENOUS | Status: AC
  Filled 2013-06-27: qty 20

## 2013-06-27 MED ORDER — HEPARIN (PORCINE) IN NACL 100-0.45 UNIT/ML-% IJ SOLN
2000.0000 [IU]/h | INTRAMUSCULAR | Status: AC
  Filled 2013-06-27 (×2): qty 250

## 2013-06-27 MED ORDER — LIDOCAINE HCL (CARDIAC) 20 MG/ML IV SOLN
INTRAVENOUS | Status: AC
  Filled 2013-06-27: qty 5

## 2013-06-27 MED ORDER — MIDAZOLAM HCL 2 MG/2ML IJ SOLN
INTRAMUSCULAR | Status: AC
  Filled 2013-06-27: qty 2

## 2013-06-27 MED ORDER — OXYCODONE HCL 5 MG/5ML PO SOLN
5.0000 mg | Freq: Once | ORAL | Status: AC | PRN
  Administered 2013-06-27: 5 mg via ORAL

## 2013-06-27 MED ORDER — OXYCODONE-ACETAMINOPHEN 5-325 MG PO TABS: 1.0000 | ORAL_TABLET | ORAL | Status: DC | PRN

## 2013-06-27 MED ORDER — LIDOCAINE HCL (CARDIAC) 20 MG/ML IV SOLN
INTRAVENOUS | Status: DC | PRN
  Administered 2013-06-27: 100 mg via INTRAVENOUS

## 2013-06-27 MED ORDER — ROCURONIUM BROMIDE 50 MG/5ML IV SOLN
INTRAVENOUS | Status: AC
  Filled 2013-06-27: qty 1

## 2013-06-27 MED ORDER — HYDROMORPHONE HCL PF 1 MG/ML IJ SOLN
INTRAMUSCULAR | Status: AC
  Filled 2013-06-27: qty 1

## 2013-06-27 MED ORDER — ONDANSETRON HCL 4 MG/2ML IJ SOLN: 4.0000 mg | Freq: Four times a day (QID) | INTRAMUSCULAR | Status: DC | PRN

## 2013-06-27 MED ORDER — LACTATED RINGERS IV SOLN
INTRAVENOUS | Status: DC | PRN
  Administered 2013-06-27: 08:00:00 via INTRAVENOUS

## 2013-06-27 MED ORDER — PROPOFOL 10 MG/ML IV BOLUS
INTRAVENOUS | Status: DC | PRN
  Administered 2013-06-27: 200 mg via INTRAVENOUS

## 2013-06-27 MED ORDER — METOCLOPRAMIDE HCL 5 MG PO TABS: 5.0000 mg | ORAL_TABLET | Freq: Three times a day (TID) | ORAL | Status: DC | PRN

## 2013-06-27 MED ORDER — FENTANYL CITRATE 0.05 MG/ML IJ SOLN
INTRAMUSCULAR | Status: DC | PRN
  Administered 2013-06-27 (×6): 50 ug via INTRAVENOUS

## 2013-06-27 MED ORDER — NEPRO/CARBSTEADY PO LIQD
237.0000 mL | Freq: Two times a day (BID) | ORAL | Status: DC
  Administered 2013-06-27 – 2013-06-29 (×4): 237 mL via ORAL

## 2013-06-27 MED ORDER — METOCLOPRAMIDE HCL 5 MG/ML IJ SOLN: 5.0000 mg | Freq: Three times a day (TID) | INTRAMUSCULAR | Status: DC | PRN

## 2013-06-27 MED ORDER — METHOCARBAMOL 500 MG PO TABS: 500.0000 mg | ORAL_TABLET | Freq: Four times a day (QID) | ORAL | Status: DC | PRN

## 2013-06-27 MED ORDER — OXYCODONE HCL 5 MG/5ML PO SOLN
ORAL | Status: AC
  Filled 2013-06-27: qty 5

## 2013-06-27 MED ORDER — ONDANSETRON HCL 4 MG/2ML IJ SOLN
INTRAMUSCULAR | Status: DC | PRN
  Administered 2013-06-27: 4 mg via INTRAVENOUS

## 2013-06-27 MED ORDER — OXYCODONE HCL 5 MG PO TABS: 5.0000 mg | ORAL_TABLET | Freq: Once | ORAL | Status: AC | PRN

## 2013-06-27 MED ORDER — DOCUSATE SODIUM 100 MG PO CAPS: 100.0000 mg | ORAL_CAPSULE | Freq: Two times a day (BID) | ORAL | Status: DC

## 2013-06-27 MED ORDER — 0.9 % SODIUM CHLORIDE (POUR BTL) OPTIME
TOPICAL | Status: DC | PRN
  Administered 2013-06-27: 1000 mL

## 2013-06-27 MED ORDER — MIDAZOLAM HCL 5 MG/5ML IJ SOLN
INTRAMUSCULAR | Status: DC | PRN
  Administered 2013-06-27: 2 mg via INTRAVENOUS

## 2013-06-27 MED ORDER — METHOCARBAMOL 1000 MG/10ML IJ SOLN: 500.0000 mg | Freq: Four times a day (QID) | INTRAVENOUS | Status: DC | PRN

## 2013-06-27 MED ORDER — PROMETHAZINE HCL 25 MG/ML IJ SOLN: 6.2500 mg | INTRAMUSCULAR | Status: DC | PRN

## 2013-06-27 SURGICAL SUPPLY — 44 items
BANDAGE GAUZE ELAST BULKY 4 IN (GAUZE/BANDAGES/DRESSINGS) IMPLANT
BLADE SAW RECIP 87.9 MT (BLADE) ×3 IMPLANT
BLADE SURG 21 STRL SS (BLADE) ×3 IMPLANT
BNDG COHESIVE 6X5 TAN STRL LF (GAUZE/BANDAGES/DRESSINGS) ×3 IMPLANT
CANISTER SUCTION WELLS/JOHNSON (MISCELLANEOUS) ×3 IMPLANT
COVER SURGICAL LIGHT HANDLE (MISCELLANEOUS) ×3 IMPLANT
CUFF TOURNIQUET SINGLE 34IN LL (TOURNIQUET CUFF) IMPLANT
CUFF TOURNIQUET SINGLE 44IN (TOURNIQUET CUFF) IMPLANT
DRAIN PENROSE 1/2X12 LTX STRL (WOUND CARE) IMPLANT
DRAPE EXTREMITY T 121X128X90 (DRAPE) ×3 IMPLANT
DRAPE PROXIMA HALF (DRAPES) ×3 IMPLANT
DRAPE U-SHAPE 47X51 STRL (DRAPES) ×3 IMPLANT
DRSG ADAPTIC 3X8 NADH LF (GAUZE/BANDAGES/DRESSINGS) ×3 IMPLANT
DURAPREP 26ML APPLICATOR (WOUND CARE) ×6 IMPLANT
ELECT CAUTERY BLADE 6.4 (BLADE) ×3 IMPLANT
ELECT REM PT RETURN 9FT ADLT (ELECTROSURGICAL) ×3
ELECTRODE REM PT RTRN 9FT ADLT (ELECTROSURGICAL) ×1 IMPLANT
EVACUATOR 1/8 PVC DRAIN (DRAIN) IMPLANT
GLOVE BIOGEL PI IND STRL 7.0 (GLOVE) ×1 IMPLANT
GLOVE BIOGEL PI IND STRL 9 (GLOVE) ×1 IMPLANT
GLOVE BIOGEL PI INDICATOR 7.0 (GLOVE) ×2
GLOVE BIOGEL PI INDICATOR 9 (GLOVE) ×2
GLOVE ECLIPSE 6.5 STRL STRAW (GLOVE) ×6 IMPLANT
GLOVE SURG ORTHO 9.0 STRL STRW (GLOVE) ×6 IMPLANT
GOWN STRL REUS W/ TWL XL LVL3 (GOWN DISPOSABLE) ×2 IMPLANT
GOWN STRL REUS W/TWL XL LVL3 (GOWN DISPOSABLE) ×4
KIT BASIN OR (CUSTOM PROCEDURE TRAY) ×3 IMPLANT
KIT ROOM TURNOVER OR (KITS) ×3 IMPLANT
MANIFOLD NEPTUNE II (INSTRUMENTS) IMPLANT
NS IRRIG 1000ML POUR BTL (IV SOLUTION) ×3 IMPLANT
PACK GENERAL/GYN (CUSTOM PROCEDURE TRAY) ×3 IMPLANT
PAD ABD 8X10 STRL (GAUZE/BANDAGES/DRESSINGS) ×6 IMPLANT
PAD ARMBOARD 7.5X6 YLW CONV (MISCELLANEOUS) ×3 IMPLANT
SPONGE GAUZE 4X4 12PLY (GAUZE/BANDAGES/DRESSINGS) ×3 IMPLANT
STAPLER VISISTAT 35W (STAPLE) ×3 IMPLANT
STOCKINETTE IMPERVIOUS LG (DRAPES) ×3 IMPLANT
SUT PDS AB 1 CT  36 (SUTURE) ×4
SUT PDS AB 1 CT 36 (SUTURE) ×2 IMPLANT
SUT SILK 2 0 (SUTURE) ×2
SUT SILK 2-0 18XBRD TIE 12 (SUTURE) ×1 IMPLANT
SWAB COLLECTION DEVICE MRSA (MISCELLANEOUS) IMPLANT
TOWEL OR 17X24 6PK STRL BLUE (TOWEL DISPOSABLE) ×3 IMPLANT
TOWEL OR 17X26 10 PK STRL BLUE (TOWEL DISPOSABLE) ×3 IMPLANT
TUBE ANAEROBIC SPECIMEN COL (MISCELLANEOUS) IMPLANT

## 2013-06-27 NOTE — H&P (View-Only) (Signed)
Patient ID: Steven Logan, male   DOB: 12-22-1970, 43 y.o.   MRN: [id number] Temperature and white blood cell count improving slowly. Plan for above-the-knee amputation tomorrow morning. Patient has insufficient soft tissue envelope and still ischemic tissue and does not have a viable transtibial amputation.

## 2013-06-27 NOTE — Progress Notes (Signed)
NUTRITION FOLLOW UP/CONSULT   Intervention:   - D/C Resource Breeze - Continue Prostat TID - Add Nepro shake BID - Encouraged increased meal intake - Liberalized diet to regular per RD consult order from PA to liberalize diet to promote increased intake - RD to continue to monitor   Nutrition Dx:   Increased nutrient needs related to wound healing and HD as evidenced by estimated needs - ongoing   Goal:   Intake to meet >90% of estimated nutrition needs - not met   Monitor:   Weights, labs, intake, diarrhea  Assessment:   PMHx significant for DM, HTN, dyslipidemia, ESRD (on HD). Admitted with several week hx of swelling in R ankle. Work-up reveals gangrenous R leg, septic R ankle.   5/28: -Underwent I&D of R ankle on 5/10.  -Underwent excision fascia of the deep and superficial and lateral compartments of R leg, irrigation of R ankle, and partial excision of the muscles of the deep superficial and lateral compartments of R leg on 5/11. Per ortho - pt found to have "massive necrotizing fasciitis and purulent abscess of entire R leg."  -Underwent I&D of R leg on 5/13.  -Underwent R transtibial amputation on 5/19.  -Per ortho MD note on 5/24 - amputation site is healing nicely.  -Pt developed acute non-occlusive DVT of RUE with jugular, subclavian, axillary and brachial veins involved.  -Pt with MASD to sacrum.  -Currently ordered for Renal/CHO Modified Diet. Pt reports that his appetite is fair. Family is asking why patient always seems to get the same foods over and over - such as green beans and grilled chicken. We reviewed the menu and discussed other appropriate options.  -Pt reports that his Glucerna Shakes are causing him to have diarrhea. Agreeable to [employer].  -Pt is currently 93% of his usual body weight. This is consistent with expected 6.5% wt loss from amputation.   6/3: -Had irrigation and debridement and R transtibial amputation revision on 5/31 -Had blood  transfusion 5/31 -Had right above the knee amputation today -Pt reports his appetite is getting better but admits to only eating about 10-50% of meals. Does not like [employer] but [employer], currently getting it TID but has been consuming 2/day  -States diarrhea is still there but getting better -Working on late lunch during RD visit    Phosphorus elevated 7.6 mg/dL   Height: Ht Readings from Last 1 Encounters:  06/24/13 6' 1"  (1.854 m)    Weight Status:   Wt Readings from Last 1 Encounters:  06/26/13 224 lb 10.4 oz (101.9 kg)  Admit wt         250 lb (113.3 kg)  Re-estimated needs:  Kcal: 2300 - 2600  Protein: >118 g daily  Fluid: 1.2 liters   Skin: +1 generalized edema, +1 RUE, LUE edema, stage 2 sacral pressure ulcer, right leg incision   Diet Order: Diabetic   Intake/Output Summary (Last 24 hours) at 06/27/13 1200 Last data filed at 06/27/13 1012  Gross per 24 hour  Intake 1488.75 ml  Output     50 ml  Net 1438.75 ml    Last BM: 6/2   Labs:   Recent Labs Lab 06/21/13 0630 06/22/13 0500 06/24/13 0747 06/25/13 0800  NA 133* 132* 135* 133*  K 3.7 4.0 3.5* 3.9  CL 95* 94*  --  95*  CO2 26 25  --  23  BUN 19 29*  --  37*  CREATININE 4.92* 6.45*  --  7.85*  CALCIUM 7.3* 7.2*  --  7.0*  PHOS  --  6.8*  --  7.6*  GLUCOSE 45* 107* 114* 116*    CBG (last 3)   Recent Labs  06/27/13 0936 06/27/13 1009 06/27/13 1133  GLUCAP 96 103* 115*    Scheduled Meds: . darbepoetin (ARANESP) injection - DIALYSIS  150 mcg Intravenous Q Fri-HD  . docusate sodium  100 mg Oral BID  . docusate sodium  100 mg Oral BID  . feeding supplement (PRO-STAT SUGAR FREE 64)  30 mL Oral TID WC  . feeding supplement (RESOURCE BREEZE)  1 Container Oral BID BM  . ferric gluconate (FERRLECIT/NULECIT) IV  125 mg Intravenous Q M,W,F-HD  . gabapentin  100 mg Oral TID  . HYDROmorphone      . insulin aspart  0-20 Units Subcutaneous TID WC  . labetalol  200 mg Oral BID  .  multivitamin  1 tablet Oral QHS  . oxyCODONE      . piperacillin-tazobactam (ZOSYN)  IV  2.25 g Intravenous 3 times per day  . sevelamer carbonate  1,600 mg Oral TID WC  . sodium chloride  10-40 mL Intracatheter Q12H    Continuous Infusions: . sodium chloride 10 mL/hr at 06/22/13 2025  . sodium chloride      Glory Rosebush MS, RD, [place] [phone] Pager [phone] Weekend/After Hours Pager

## 2013-06-27 NOTE — Anesthesia Preprocedure Evaluation (Signed)
Anesthesia Evaluation    Reviewed: Allergy & Precautions, H&P , NPO status , Patient's Chart, lab work & pertinent test results, reviewed documented beta blocker date and time   Airway       Dental   Pulmonary shortness of breath, pneumonia -,          Cardiovascular hypertension, Pt. on home beta blockers + Peripheral Vascular Disease     Neuro/Psych PSYCHIATRIC DISORDERS Depression negative neurological ROS     GI/Hepatic negative GI ROS, Neg liver ROS,   Endo/Other  diabetesMorbid obesity  Renal/GU CRF and ESRFRenal disease     Musculoskeletal   Abdominal   Peds  Hematology   Anesthesia Other Findings   Reproductive/Obstetrics                           Anesthesia Physical Anesthesia Plan  ASA: III  Anesthesia Plan: General   Post-op Pain Management:    Induction: Intravenous  Airway Management Planned: LMA  Additional Equipment:   Intra-op Plan:   Post-operative Plan: Extubation in OR  Informed Consent:   Plan Discussed with: CRNA, Anesthesiologist and Surgeon  Anesthesia Plan Comments:         Anesthesia Quick Evaluation

## 2013-06-27 NOTE — Transfer of Care (Signed)
Immediate Anesthesia Transfer of Care Note  Patient: Steven Logan  Procedure(s) Performed: Procedure(s) with comments: AMPUTATION ABOVE KNEE (Right) - Right Above Knee Amputation  Patient Location: PACU  Anesthesia Type:General  Level of Consciousness: awake  Airway & Oxygen Therapy: Patient Spontanous Breathing and Patient connected to nasal cannula oxygen  Post-op Assessment: Report given to PACU RN and Post -op Vital signs reviewed and stable  Post vital signs: Reviewed and stable  Complications: No apparent anesthesia complications

## 2013-06-27 NOTE — Interval H&P Note (Signed)
History and Physical Interval Note:  06/27/2013 6:36 AM  Steven Logan  has presented today for surgery, with the diagnosis of Necrotizing Fascitis Right Leg  The various methods of treatment have been discussed with the patient and family. After consideration of risks, benefits and other options for treatment, the patient has consented to  Procedure(s) with comments: AMPUTATION ABOVE KNEE (Right) - Right Above Knee Amputation as a surgical intervention .  [name] history has been reviewed, patient examined, no change in status, stable for surgery.  I have reviewed the patient's chart and labs.  Questions were answered to the patient's satisfaction.     Newt Minion

## 2013-06-27 NOTE — Progress Notes (Signed)
Patient ID: Steven Logan, male   DOB: 08-02-1970, 43 y.o.   MRN: [id number] [facility] PROGRESS NOTE  Steven Logan [id number] DOB: [birth year] DOA: 06/02/2013 PCP: Tivis Ringer, MD  Brief narrative: 43 year old male with past medical history of gout, HTN, DLD, PE, Pancreatitis, DM who presented to [facility] ED 06/02/2013 with worsening right ankle swelling for 2 weeks prior to this admission. Patient was started on prednisone for gout treatment initially which did not help. His wound opened up and started to have drainage. Patient subsequently re-presented to the ER. An MRI of the ankle revealed gas-containing abscess and cellulitis. Orthopedics took him emergently to the OR for debridement. He underwent right lower extremity transtibial amputation and remained on IV Zosyn. He continued to have fever post  surgery. ID was consulted. Patient was also started on Vancomycin.  He was diagnosed with acute nonocclusive DVT right upper extremity. 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 He had repeated CT LE due to persistent fever which show a large hematoma. Heparin has been on hold. He has received multiple Blood transfusion. 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 Cultures from wound grew MSSA. Continue with Zosyn. ID following. He is scheduled for right AKA on 6-3. Vanc was discontinued by ID.  Fever C diff negative. Chest x ray showed atelectasis. Blood culture 5-25 no growth to date. CT LE show big abscess.   Diarrhea Better after stopping laxatives.   Acute nonocclusive DVT RUE  Jugular, subclavian, axillary, and brachial veins involved. Has had recent removal of HD cath from right IJ 5/10 (which has been in place since Aug 2014). Discussed with Dr Alvy Bimler, she doesn't think that hyperpigmentation is related to  coumadin. He was started on Heparin and coumadin. These had to be held for surgery. To be resumed 6/4 per Ortho.   Anemia in chronic kidney disease / acute postoperative blood loss anemia  Has received 2 units PRBC blood transfusion 06/18/2013. Hb drop to 6. CT with  Large (20 cm x 9 cm x 12 cm) multiloculated hematoma in the posterior leg extending from the region of the myocutaneous flap along the dorsal leg to the popliteal fossa. Received 2 units on 5-31. One unit the night of 5-31. 2 units of PRBC 6-1 during dialysis. 2 units 6-2.   Encephalopathy, toxic  Resolved.   End stage renal disease on HD M/W/F  Per Nephrology  Hypertension  Stable.   DM / Hyperglycemia  A1c 7.0; good glycemic control. Had hypoglycemic episodes and Lantus was discontinued.   Hyperparathyroidism Continue Calcitrol and renvela   DVT Prophylaxis: To resume Heparin/warfarin 6/4. Code Status: full code Family Communication: no family at the bedside  Disposition Plan: Will likely need placement. PT/OT following.   Consultants:   Ortho   Nephro   ID  Procedures:   I and D on 06/03/2013   IRRIGATION AND DEBRIDEMENT EXTREMITY extensive excision of muscle fascia and soft tissue right leg 06/06/13   Transtibial amputation 5/19   ID 5-31  Right BKA 5/19  Right AKA 6/3    Antibiotics:   Vanc 5/9 >5/17,  5-29-->6/2  Zosyn 5/9 >   HPI/Subjective: He denies any complaints. Anxious about surgery.   Objective: Filed Vitals:   06/26/13 1704 06/26/13 1805 06/26/13 2051 06/27/13 0521  BP: 183/79 172/79 137/94  154/61  Pulse: 91 95 100 85  Temp: 99.3 F (37.4 C) 99.2 F (37.3 C) 98.9 F (37.2 C) 98.2 F (36.8 C)  TempSrc:   Oral Oral  Resp: 15 15 16 16   Height:      Weight:   101.9 kg (224 lb 10.4 oz)   SpO2: 97% 98% 97% 98%    Intake/Output Summary (Last 24 hours) at 06/27/13 0724 Last data filed at 06/26/13 2300  Gross per 24 hour  Intake 1478.54 ml  Output      0 ml  Net 1478.54  ml    Exam:   General:  Pt is alert, not in acute distress  Cardiovascular: Regular rate and rhythm, S1/S2 appreciated   Respiratory: Clear to auscultation bilaterally, no wheezing  Abdomen: Soft, non tender, non distended, bowel sounds present  Extremities: right BKA with dressing.    Neuro: Grossly nonfocal  Data Reviewed: Basic Metabolic Panel:  Recent Labs Lab 06/20/13 0749 06/21/13 0630 06/22/13 0500 06/24/13 0747 06/25/13 0800  NA 135* 133* 132* 135* 133*  K 4.2 3.7 4.0 3.5* 3.9  CL 96 95* 94*  --  95*  CO2 25 26 25   --  23  GLUCOSE 66* 45* 107* 114* 116*  BUN 34* 19 29*  --  37*  CREATININE 7.11* 4.92* 6.45*  --  7.85*  CALCIUM 7.0* 7.3* 7.2*  --  7.0*  PHOS  --   --  6.8*  --  7.6*   Liver Function Tests:  Recent Labs Lab 06/22/13 0500 06/25/13 0800  ALBUMIN 1.3* 1.2*   CBC:  Recent Labs Lab 06/23/13 0510  06/24/13 1555 06/25/13 0800 06/26/13 0030 06/26/13 0500 06/27/13 0530  WBC 18.5*  < > 16.7* 15.0* 14.0* 14.5* 14.1*  NEUTROABS 11.0*  --   --   --   --   --   --   HGB 7.2*  < > 6.5* 6.1* 7.1* 7.4* 8.3*  HCT 23.1*  < > 19.5* 18.2* 21.1* 22.2* 24.5*  MCV 91.7  < > 87.8 87.1 87.6 87.7 87.5  PLT 381  < > 285 263 285 287 296  < > = values in this interval not displayed.  CBG:  Recent Labs Lab 06/26/13 0742 06/26/13 1216 06/26/13 1700 06/26/13 2050 06/27/13 0617  GLUCAP 111* 108* 93 119* 128*    Recent Results (from the past 240 hour(s))  CLOSTRIDIUM DIFFICILE BY PCR     Status: None   Collection Time    06/17/13  1:09 PM      Result Value Ref Range Status   C difficile by pcr NEGATIVE  NEGATIVE Final  CULTURE, BLOOD (ROUTINE X 2)     Status: None   Collection Time    06/18/13  9:15 AM      Result Value Ref Range Status   Specimen Description BLOOD HEMODIALYSIS FISTULA   Final   Special Requests BOTTLES DRAWN AEROBIC AND ANAEROBIC 10CC   Final   Culture  Setup Time     Final   Value: 06/18/2013 16:16     Performed at  [facility]   Culture     Final   Value: NO GROWTH 5 DAYS     Performed at [facility]   Report Status 06/24/2013 FINAL   Final  CULTURE, BLOOD (ROUTINE X 2)     Status: None   Collection Time    06/18/13  9:35 AM      Result Value Ref Range Status  Specimen Description BLOOD HEMODIALYSIS FISTULA   Final   Special Requests BOTTLES DRAWN AEROBIC AND ANAEROBIC 10CC   Final   Culture  Setup Time     Final   Value: 06/18/2013 16:17     Performed at [facility]   Culture     Final   Value: NO GROWTH 5 DAYS     Performed at [facility]   Report Status 06/24/2013 FINAL   Final  CLOSTRIDIUM DIFFICILE BY PCR     Status: None   Collection Time    06/20/13  5:48 PM      Result Value Ref Range Status   C difficile by pcr NEGATIVE  NEGATIVE Final  SURGICAL PCR SCREEN     Status: None   Collection Time    06/27/13  1:31 AM      Result Value Ref Range Status   MRSA, PCR NEGATIVE  NEGATIVE Final   Staphylococcus aureus NEGATIVE  NEGATIVE Final   Comment:            The Xpert SA Assay (FDA     approved for NASAL specimens     in patients over 18 years of age),     is one component of     a comprehensive surveillance     program.  Test performance has     been validated by [employer] for patients greater     than or equal to 30 year old.     It is not intended     to diagnose infection nor to     guide or monitor treatment.     Studies: No results found.  Scheduled Meds: . chlorhexidine  60 mL Topical Once  . darbepoetin (ARANESP) injection - DIALYSIS  150 mcg Intravenous Q Fri-HD  . docusate sodium  100 mg Oral BID  . feeding supplement (PRO-STAT SUGAR FREE 64)  30 mL Oral TID WC  . feeding supplement (RESOURCE BREEZE)  1 Container Oral BID BM  . ferric gluconate (FERRLECIT/NULECIT) IV  125 mg Intravenous Q M,W,F-HD  . gabapentin  100 mg Oral TID  . insulin aspart  0-20 Units Subcutaneous TID WC  . labetalol  200 mg Oral BID  .  multivitamin  1 tablet Oral QHS  . piperacillin-tazobactam (ZOSYN)  IV  2.25 g Intravenous 3 times per day  . sevelamer carbonate  1,600 mg Oral TID WC  . sodium chloride  10-40 mL Intracatheter Q12H   Continuous Infusions: . sodium chloride 10 mL/hr at 06/22/13 2025     Bonnielee Haff MD  [facility] Pager [phone]  If 7PM-7AM, please contact night-coverage [email or web address] Password [email or web address] 06/22/2013, 8:18 AM   LOS: 20 days

## 2013-06-27 NOTE — Anesthesia Postprocedure Evaluation (Signed)
Anesthesia Post Note  Patient: Steven Logan  Procedure(s) Performed: Procedure(s) (LRB): AMPUTATION ABOVE KNEE (Right)  Anesthesia type: general  Patient location: PACU  Post pain: Pain level controlled  Post assessment: Patient's Cardiovascular Status Stable  Last Vitals:  Filed Vitals:   06/27/13 1012  BP: 176/62  Pulse: 84  Temp: 37.3 C  Resp: 18    Post vital signs: Reviewed and stable  Level of consciousness: sedated  Complications: No apparent anesthesia complications

## 2013-06-27 NOTE — Progress Notes (Signed)
[facility] Progress Note  Assessment/Plan: 1.  BKA R leg 5/19 > now with R AKA done today (MSSA R foot and leg infection)- on IV vanc and zosyn 2.  Anemia d/t ABL/ esrd- s/p 3 U PRBC so far, cont aranesp 100 given 5/29 increased to 150 ordered for  6/5 next dose - need to be sure he gets it if HD is off schedule; and IV Fe Hgb 8.3 this am; given history and extensive surgery with check post op CBC 3.  HTPH- cont vit D, renvela, Corrected Ca 9.2 - will resume calcitriol if corrected Ca ok tomorrow  4.  RUE DVT- anticoagulation on hold due to large stump hematoma - vit K given 6/2, per primary 5.  HTN/vol below prior dry wt, , on labetalol po UF 2.5 L 6/1 6.  ESRD MWF - defer HD until Thursday if no acute event today-  no heparin; check post op BMP 7.  Protein malnutrition - Alb 1.2 - missing meals due to multiple procedures/HD and poor appetite - nutrition consulted may need to liberalize diet to regular; resource and prostat ordered  Myriam Jacobson, PA-C [place] [phone] 06/27/2013,8:30 AM  LOS: 25 days   Pt seen, examined and agree w A/P as above.  Kelly Splinter MD pager [phone]    cell [phone] 06/27/2013, 2:29 PM    Subjective:   Some pain from surgery  Objective Filed Vitals:   06/26/13 1704 06/26/13 1805 06/26/13 2051 06/27/13 0521  BP: 183/79 172/79 137/94 154/61  Pulse: 91 95 100 85  Temp: 99.3 F (37.4 C) 99.2 F (37.3 C) 98.9 F (37.2 C) 98.2 F (36.8 C)  TempSrc:   Oral Oral  Resp: 15 15 16 16   Height:      Weight:   101.9 kg (224 lb 10.4 oz)   SpO2: 97% 98% 97% 98%   Physical Exam General: NAD resting  Heart: RRR Lungs: no wheezes or rales Abdomen: soft Extremities: right AKA wrapped; left LE no edema Dialysis Access: left AVF  Dialysis Orders: [facility]  4.5h 111.5kg 2/2.5 Bath 500/800 Heparin-- none for now due to anemia/ hematoma (was on 11000) L AVF use 14 needles  Calcitriol 1.25ug EPO 8000 Venofer s/p  load completed 5/11    Additional Objective Labs: Basic Metabolic Panel:  Recent Labs Lab 06/21/13 0630 06/22/13 0500 06/24/13 0747 06/25/13 0800  NA 133* 132* 135* 133*  K 3.7 4.0 3.5* 3.9  CL 95* 94*  --  95*  CO2 26 25  --  23  GLUCOSE 45* 107* 114* 116*  BUN 19 29*  --  37*  CREATININE 4.92* 6.45*  --  7.85*  CALCIUM 7.3* 7.2*  --  7.0*  PHOS  --  6.8*  --  7.6*   Liver Function Tests:  Recent Labs Lab 06/22/13 0500 06/25/13 0800  ALBUMIN 1.3* 1.2*   CBC:  Recent Labs Lab 06/23/13 0510  06/24/13 1555 06/25/13 0800 06/26/13 0030 06/26/13 0500 06/27/13 0530  WBC 18.5*  < > 16.7* 15.0* 14.0* 14.5* 14.1*  NEUTROABS 11.0*  --   --   --   --   --   --   HGB 7.2*  < > 6.5* 6.1* 7.1* 7.4* 8.3*  HCT 23.1*  < > 19.5* 18.2* 21.1* 22.2* 24.5*  MCV 91.7  < > 87.8 87.1 87.6 87.7 87.5  PLT 381  < > 285 263 285 287 296  < > = values in this interval  not displayed. CBG:  Recent Labs Lab 06/26/13 0742 06/26/13 1216 06/26/13 1700 06/26/13 2050 06/27/13 0617  GLUCAP 111* 108* 93 119* 128*   Lab Results  Component Value Date   INR 1.30 06/27/2013   INR 2.14* 06/26/2013   INR 1.98* 06/25/2013    Medications: . sodium chloride 10 mL/hr at 06/22/13 2025   . chlorhexidine  60 mL Topical Once  . [MAR HOLD] darbepoetin (ARANESP) injection - DIALYSIS  150 mcg Intravenous Q Fri-HD  . [facility] HOLD] docusate sodium  100 mg Oral BID  . [MAR HOLD] feeding supplement (PRO-STAT SUGAR FREE 64)  30 mL Oral TID WC  . [MAR HOLD] feeding supplement (RESOURCE BREEZE)  1 Container Oral BID BM  . [facility] HOLD] ferric gluconate (FERRLECIT/NULECIT) IV  125 mg Intravenous Q M,W,F-HD  . [facility] HOLD] gabapentin  100 mg Oral TID  . [MAR HOLD] insulin aspart  0-20 Units Subcutaneous TID WC  . [facility] HOLD] labetalol  200 mg Oral BID  . [MAR HOLD] multivitamin  1 tablet Oral QHS  . [MAR HOLD] piperacillin-tazobactam (ZOSYN)  IV  2.25 g Intravenous 3 times per day  . [facility] HOLD] sevelamer carbonate   1,600 mg Oral TID WC  . [facility] HOLD] sodium chloride  10-40 mL Intracatheter Q12H

## 2013-06-27 NOTE — Op Note (Signed)
06/02/2013 - 06/27/2013  11:06 AM  PATIENT:  Steven Logan    PRE-OPERATIVE DIAGNOSIS:  Necrotizing Fascitis Right Leg  POST-OPERATIVE DIAGNOSIS:  Same  PROCEDURE:  AMPUTATION ABOVE KNEE  SURGEON:  Newt Minion, MD  PHYSICIAN ASSISTANT:None ANESTHESIA:   General  PREOPERATIVE INDICATIONS:  Steven Logan is a  43 y.o. male with a diagnosis of Necrotizing Fascitis Right Leg who failed conservative measures and elected for surgical management.    The risks benefits and alternatives were discussed with the patient preoperatively including but not limited to the risks of infection, bleeding, nerve injury, cardiopulmonary complications, the need for revision surgery, among others, and the patient was willing to proceed.  OPERATIVE IMPLANTS: None  OPERATIVE FINDINGS: No signs of abscess or purulence. No necrotic tissue. Muscle had good color but had poor contractility.  OPERATIVE PROCEDURE: Patient was brought to the operating room and underwent a general anesthetic. After adequate levels of anesthesia were obtained patient's right lower extremity was prepped using DuraPrep draped into a sterile field. A timeout was called. A fishmouth incision was made just proximal to the patella. The amputation was performed proximal to the flare of the condyles. The bone was resected. The vascular bundles were suture ligated with 2-0 silk. Electrocautery was used for hemostasis. The muscle had poor contractility but no signs of necrosis no signs of any necrotizing fasciitis no abscess. The wound was irrigated normal saline. Hemostasis was obtained. The deep and superficial fascial layers were closed using #1 PDS. The skin was closed using staples. The wound was covered with a sterile compressive dressing. Patient was extubated taken to the PACU in stable condition.

## 2013-06-27 NOTE — Progress Notes (Signed)
ANTICOAGULATION CONSULT NOTE - Follow Up Consult  Pharmacy Consult for Heparin + Warfarin Indication: RUE DVT and hx PE  No Known Allergies  Patient Measurements: Height: 6\' 1"  (185.4 cm) Weight: 224 lb 10.4 oz (101.9 kg) IBW/kg (Calculated) : 79.9 Heparin Dosing Weight: 100 kg  Vital Signs: Temp: 99.2 F (37.3 C) (06/03 1012) Temp src: Oral (06/03 1012) BP: 176/62 mmHg (06/03 1012) Pulse Rate: 84 (06/03 1012)  Labs:  Recent Labs  06/25/13 0800  06/26/13 0500 06/27/13 0530 06/27/13 1120  HGB 6.1*  < > 7.4* 8.3* 7.8*  HCT 18.2*  < > 22.2* 24.5* 23.4*  PLT 263  < > 287 296 263  LABPROT 21.9*  --  23.2* 15.9*  --   INR 1.98*  --  2.14* 1.30  --   CREATININE 7.85*  --   --   --  7.03*  < > = values in this interval not displayed.  Estimated Creatinine Clearance: 17 ml/min (by C-G formula based on Cr of 7.03).   Assessment: 48 YOM started on heparin + warfarin for new RUE DVT diagnosed this admit. 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 The patient is now s/p R-AKA (hematoma removed) and per Dr. Sharol Given - okay to resume anticoagulation starting on 6/4 AM. The patient was previously therapeutic on a rate of 2400 units/hr (24 ml/hr) - will reduce this rate with restart due to recent bleeding/hematoma. Will wait to dose warfarin based on INR on 6/4.   Goal of Therapy:  INR 2-3 Heparin level 0.3-0.5 units/ml (will shoot for longer range d/t recent hematoma/bleed) Monitor platelets by anticoagulation protocol: Yes   Plan:  1. Initiate heparin at 2000 units/hr (20 ml/hr) starting at 0800 on 6/4 2. Will f/u PT/INR on 6/4 and dose warfarin at that time 3. Will continue to monitor for any signs/symptoms of bleeding and will follow up with heparin level 8 hours after drip started  Alycia Rossetti, PharmD, BCPS Clinical Pharmacist Pager: [phone] 06/27/2013 1:51 PM

## 2013-06-27 NOTE — Addendum Note (Signed)
Addendum created 06/27/13 1225 by Purvis Kilts, CRNA   Modules edited: Anesthesia LDA

## 2013-06-28 LAB — CBC
HCT: 23.3 % — ABNORMAL LOW (ref 39.0–52.0)
Hemoglobin: 7.9 g/dL — ABNORMAL LOW (ref 13.0–17.0)
MCH: 29.9 pg (ref 26.0–34.0)
MCHC: 33.9 g/dL (ref 30.0–36.0)
MCV: 88.3 fL (ref 78.0–100.0)
Platelets: 332 10*3/uL (ref 150–400)
RBC: 2.64 MIL/uL — ABNORMAL LOW (ref 4.22–5.81)
RDW: 15.1 % (ref 11.5–15.5)
WBC: 14.5 10*3/uL — ABNORMAL HIGH (ref 4.0–10.5)

## 2013-06-28 LAB — BASIC METABOLIC PANEL WITH GFR
BUN: 45 mg/dL — ABNORMAL HIGH (ref 6–23)
CO2: 23 meq/L (ref 19–32)
Calcium: 7.1 mg/dL — ABNORMAL LOW (ref 8.4–10.5)
Chloride: 96 meq/L (ref 96–112)
Creatinine, Ser: 7.97 mg/dL — ABNORMAL HIGH (ref 0.50–1.35)
GFR calc Af Amer: 9 mL/min — ABNORMAL LOW
GFR calc non Af Amer: 7 mL/min — ABNORMAL LOW
Glucose, Bld: 122 mg/dL — ABNORMAL HIGH (ref 70–99)
Potassium: 3.8 meq/L (ref 3.7–5.3)
Sodium: 134 meq/L — ABNORMAL LOW (ref 137–147)

## 2013-06-28 LAB — GLUCOSE, CAPILLARY
Glucose-Capillary: 129 mg/dL — ABNORMAL HIGH (ref 70–99)
Glucose-Capillary: 128 mg/dL — ABNORMAL HIGH (ref 70–99)
Glucose-Capillary: 115 mg/dL — ABNORMAL HIGH (ref 70–99)

## 2013-06-28 LAB — PROTIME-INR
INR: 1.27 (ref 0.00–1.49)
PROTHROMBIN TIME: 15.6 s — AB (ref 11.6–15.2)

## 2013-06-28 MED ORDER — CALCITRIOL 0.5 MCG PO CAPS
1.0000 ug | ORAL_CAPSULE | ORAL | Status: DC
  Administered 2013-07-02: 1 ug via ORAL
  Filled 2013-06-28 (×2): qty 2

## 2013-06-28 MED ORDER — WARFARIN - PHARMACIST DOSING INPATIENT
Freq: Every day | Status: DC
  Administered 2013-06-29 – 2013-07-01 (×3)

## 2013-06-28 MED ORDER — WARFARIN SODIUM 5 MG PO TABS
5.0000 mg | ORAL_TABLET | Freq: Once | ORAL | Status: AC
  Administered 2013-06-28: 5 mg via ORAL
  Filled 2013-06-28: qty 1

## 2013-06-28 MED ORDER — HYDRALAZINE HCL 20 MG/ML IJ SOLN
5.0000 mg | Freq: Four times a day (QID) | INTRAMUSCULAR | Status: DC | PRN
  Filled 2013-06-28: qty 1

## 2013-06-28 MED ORDER — CALCITRIOL 1 MCG/ML IV SOLN
INTRAVENOUS | Status: AC
  Filled 2013-06-28: qty 1

## 2013-06-28 MED ORDER — CEFAZOLIN SODIUM-DEXTROSE 2-3 GM-% IV SOLR
2.0000 g | INTRAVENOUS | Status: AC
  Administered 2013-06-28: 2 g via INTRAVENOUS
  Filled 2013-06-28: qty 50

## 2013-06-28 MED ORDER — SEVELAMER CARBONATE 800 MG PO TABS
2400.0000 mg | ORAL_TABLET | Freq: Three times a day (TID) | ORAL | Status: DC
  Administered 2013-06-28 – 2013-07-02 (×11): 2400 mg via ORAL
  Filled 2013-06-28 (×15): qty 3

## 2013-06-28 MED ORDER — CALCITRIOL 0.5 MCG PO CAPS
1.0000 ug | ORAL_CAPSULE | ORAL | Status: DC
  Administered 2013-06-28: 1 ug via ORAL
  Filled 2013-06-28: qty 2

## 2013-06-28 MED ORDER — OXYCODONE-ACETAMINOPHEN 5-325 MG PO TABS
ORAL_TABLET | ORAL | Status: AC
  Filled 2013-06-28: qty 1

## 2013-06-28 MED ORDER — ENOXAPARIN SODIUM 100 MG/ML ~~LOC~~ SOLN
100.0000 mg | SUBCUTANEOUS | Status: DC
  Administered 2013-06-28 – 2013-07-02 (×5): 100 mg via SUBCUTANEOUS
  Filled 2013-06-28 (×5): qty 1

## 2013-06-28 NOTE — Progress Notes (Signed)
Patient ID: Steven Logan, male   DOB: Oct 28, 1970, 43 y.o.   MRN: [id number] [facility] PROGRESS NOTE  SADAO HANNER [id number] DOB: 05/11/1970 DOA: 06/02/2013 PCP: Tivis Ringer, MD  Brief narrative: 43 year old male with past medical history of gout, HTN, DLD, PE, Pancreatitis, DM who presented to [facility] ED 06/02/2013 with worsening right ankle swelling for 2 weeks prior to this admission. Patient was started on prednisone for gout treatment initially which did not help. His wound opened up and started to have drainage. Patient subsequently re-presented to the ER. An MRI of the ankle revealed gas-containing abscess and cellulitis. Orthopedics took him emergently to the OR for debridement. He underwent right lower extremity transtibial amputation and remained on IV Zosyn. He continued to have fever post  surgery. ID was consulted. Patient was also started on Vancomycin.  He was diagnosed with acute nonocclusive DVT right upper extremity. 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 He had repeated CT LE due to persistent fever which show a large hematoma. Heparin has been on hold. He has received multiple Blood transfusion. 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 Cultures from wound initially grew MSSA. Vanc was discontinued by ID. Zosyn was continued. ID was following and recommended changing to Cefazolin post surgery. Will order same. Will need to know duration of treatment.  Fever C diff negative. Chest x ray showed atelectasis. Blood culture 5-25 no growth to date. CT LE showed large, possibly infected, hematoma. Now status post right AKA. Low grade fevers noted.  Diarrhea Better after stopping laxatives.   Acute nonocclusive DVT RUE  Jugular, subclavian, axillary, and  brachial veins involved. Has had recent removal of HD cath from right IJ 5/10 (which has been in place since Aug 2014). Discussed with Dr Alvy Bimler, she doesn't think that hyperpigmentation is related to coumadin. He was started on Heparin and coumadin. These had to be held for surgery. To be resumed 6/4 per Ortho. Will discuss with pharmacy if Lovenox can be utilized prior to transfer to SNF.  Anemia in chronic kidney disease / acute postoperative blood loss anemia  Bleeding into right stump. Now s/p Right AKA. Has received multiple blood transfusions (At least 9 units of PRBC).   Encephalopathy, toxic  Resolved.   End stage renal disease on HD M/W/F  Per Nephrology. Has fistula left UE. Was not dialysed yesterday due to surgery. Will be dialysed today.  Hypertension  BP elevated. Likely due to pain. Give his Am medications. Monitor for now without additional changes. He will also be dialysed today.  DM / Hyperglycemia  A1c 7.0; good glycemic control. Had hypoglycemic episodes and Lantus was discontinued. No further episodes of hypoglycemia.  Hyperparathyroidism Continue Calcitrol and renvela   DVT Prophylaxis: To resume Heparin/warfarin 6/4. Code Status: full code Family Communication: no family at the bedside  Disposition Plan: Will likely need placement. PT/OT following. CSW following.   Consultants:   Ortho   Nephro   ID  Procedures:   I&D on 06/03/2013   IRRIGATION AND DEBRIDEMENT EXTREMITY extensive excision of muscle fascia and soft tissue right leg 06/06/13   Transtibial amputation 5/19   I&D 5-31  Right BKA 5/19  Right AKA 6/3    Antibiotics:   Vanc 5/9 >5/17,  5-29-->6/2  Zosyn 5/9 > 6/4  Cefazolin 6/4-->  HPI/Subjective: He had severe pain overnight in his right stump. Feels better now. Denies chest pain or shortness of breath.   Objective: Filed Vitals:   06/27/13 1012 06/27/13 1723 06/27/13 2130 06/28/13 0500  BP: 176/62 153/84 186/77 200/64   Pulse: 84 88 103 96  Temp: 99.2 F (37.3 C) 99.2 F (37.3 C) 100 F (37.8 C) 99.2 F (37.3 C)  TempSrc: Oral Oral Oral Oral  Resp: 18 20 18 18   Height:      Weight:      SpO2: 99% 99% 98% 97%    Intake/Output Summary (Last 24 hours) at 06/28/13 0806 Last data filed at 06/27/13 1859  Gross per 24 hour  Intake    880 ml  Output     50 ml  Net    830 ml    Exam:   General:  Pt is alert, not in acute distress  Cardiovascular: Regular rate and rhythm, S1/S2 appreciated   Respiratory: Clear to auscultation bilaterally, no wheezing  Abdomen: Soft, non tender, non distended, bowel sounds present  Extremities: right LE stump covered with dressing.   Neuro: Grossly nonfocal  Data Reviewed: Basic Metabolic Panel:  Recent Labs Lab 06/22/13 0500 06/24/13 0747 06/25/13 0800 06/27/13 1120 06/28/13 0420  NA 132* 135* 133* 134* 134*  K 4.0 3.5* 3.9 3.8 3.8  CL 94*  --  95* 97 96  CO2 25  --  23 24 23   GLUCOSE 107* 114* 116* 119* 122*  BUN 29*  --  37* 38* 45*  CREATININE 6.45*  --  7.85* 7.03* 7.97*  CALCIUM 7.2*  --  7.0* 7.3* 7.1*  PHOS 6.8*  --  7.6*  --   --    Liver Function Tests:  Recent Labs Lab 06/22/13 0500 06/25/13 0800  ALBUMIN 1.3* 1.2*   CBC:  Recent Labs Lab 06/23/13 0510  06/26/13 0030 06/26/13 0500 06/27/13 0530 06/27/13 1120 06/28/13 0420  WBC 18.5*  < > 14.0* 14.5* 14.1* 12.2* 14.5*  NEUTROABS 11.0*  --   --   --   --   --   --   HGB 7.2*  < > 7.1* 7.4* 8.3* 7.8* 7.9*  HCT 23.1*  < > 21.1* 22.2* 24.5* 23.4* 23.3*  MCV 91.7  < > 87.6 87.7 87.5 87.6 88.3  PLT 381  < > 285 287 296 263 332  < > = values in this interval not displayed.  CBG:  Recent Labs Lab 06/27/13 1009 06/27/13 1133 06/27/13 1720 06/27/13 2142 06/28/13 0727  GLUCAP 103* 115* 113* 128* 129*    Recent Results (from the past 240 hour(s))  CULTURE, BLOOD (ROUTINE X 2)     Status: None   Collection Time    06/18/13  9:15 AM      Result Value Ref Range  Status   Specimen Description BLOOD HEMODIALYSIS FISTULA   Final   Special Requests BOTTLES DRAWN AEROBIC AND ANAEROBIC 10CC   Final   Culture  Setup Time     Final   Value: 06/18/2013 16:16     Performed at [facility]   Culture     Final   Value: NO GROWTH 5 DAYS     Performed at [facility]   Report Status 06/24/2013 FINAL   Final  CULTURE, BLOOD (ROUTINE X 2)     Status: None   Collection Time    06/18/13  9:35 AM  Result Value Ref Range Status   Specimen Description BLOOD HEMODIALYSIS FISTULA   Final   Special Requests BOTTLES DRAWN AEROBIC AND ANAEROBIC 10CC   Final   Culture  Setup Time     Final   Value: 06/18/2013 16:17     Performed at [facility]   Culture     Final   Value: NO GROWTH 5 DAYS     Performed at [facility]   Report Status 06/24/2013 FINAL   Final  CLOSTRIDIUM DIFFICILE BY PCR     Status: None   Collection Time    06/20/13  5:48 PM      Result Value Ref Range Status   C difficile by pcr NEGATIVE  NEGATIVE Final  SURGICAL PCR SCREEN     Status: None   Collection Time    06/27/13  1:31 AM      Result Value Ref Range Status   MRSA, PCR NEGATIVE  NEGATIVE Final   Staphylococcus aureus NEGATIVE  NEGATIVE Final   Comment:            The Xpert SA Assay (FDA     approved for NASAL specimens     in patients over 34 years of age),     is one component of     a comprehensive surveillance     program.  Test performance has     been validated by [employer] for patients greater     than or equal to 78 year old.     It is not intended     to diagnose infection nor to     guide or monitor treatment.     Studies: No results found.  Scheduled Meds: . darbepoetin (ARANESP) injection - DIALYSIS  150 mcg Intravenous Q Fri-HD  . docusate sodium  100 mg Oral BID  . feeding supplement (NEPRO CARB STEADY)  237 mL Oral BID BM  . feeding supplement (PRO-STAT SUGAR FREE 64)  30 mL Oral TID WC  . ferric gluconate  (FERRLECIT/NULECIT) IV  125 mg Intravenous Q M,W,F-HD  . gabapentin  100 mg Oral TID  . insulin aspart  0-20 Units Subcutaneous TID WC  . labetalol  200 mg Oral BID  . multivitamin  1 tablet Oral QHS  . piperacillin-tazobactam (ZOSYN)  IV  2.25 g Intravenous 3 times per day  . sevelamer carbonate  1,600 mg Oral TID WC  . sodium chloride  10-40 mL Intracatheter Q12H   Continuous Infusions: . sodium chloride 10 mL/hr at 06/22/13 2025  . sodium chloride 10 mL/hr at 06/27/13 1256  . heparin       Bonnielee Haff MD  [facility] Pager [phone]  If 7PM-7AM, please contact night-coverage [email or web address] Password [email or web address] 06/22/2013, 8:18 AM   LOS: 20 days

## 2013-06-28 NOTE — Progress Notes (Signed)
    [facility] Infectious [facility]    Date of Admission:  06/02/2013      ID: Steven Logan is a 43 y.o. male with  MSSA nec fasciitis/deep tissue infection s/p bka but still had ongoing compromise of viable tissue POD#1 AKA. Remains afebrile Principal Problem:   Severe sepsis Active Problems:   HTN (hypertension)   Pulmonary embolism, bilateral   Abscess of right lower leg   Encephalopathy, toxic   Anemia in chronic kidney disease   Arm edema   Leg abscess   Septic shock   Septic arthritis of ankle or foot, right   ESRD on dialysis   Hyperglycemia   Arm DVT (deep venous thromboembolism), acute   Acute blood loss anemia    Subjective: Afebrile, undergoing HD  Medications:  . calcitRIOL  1 mcg Oral Q T,Th,Sa-HD  . [START ON 07/02/2013] calcitRIOL  1 mcg Oral Q M,W,F-HD  .  ceFAZolin (ANCEF) IV  2 g Intravenous To Hemo  . darbepoetin (ARANESP) injection - DIALYSIS  150 mcg Intravenous Q Fri-HD  . docusate sodium  100 mg Oral BID  . enoxaparin (LOVENOX) injection  100 mg Subcutaneous Q24H  . feeding supplement (NEPRO CARB STEADY)  237 mL Oral BID BM  . feeding supplement (PRO-STAT SUGAR FREE 64)  30 mL Oral TID WC  . ferric gluconate (FERRLECIT/NULECIT) IV  125 mg Intravenous Q M,W,F-HD  . gabapentin  100 mg Oral TID  . insulin aspart  0-20 Units Subcutaneous TID WC  . labetalol  200 mg Oral BID  . multivitamin  1 tablet Oral QHS  . oxyCODONE-acetaminophen      . sevelamer carbonate  2,400 mg Oral TID WC  . sodium chloride  10-40 mL Intracatheter Q12H  . warfarin  5 mg Oral ONCE-1800  . Warfarin - Pharmacist Dosing Inpatient   Does not apply q1800    Objective: Vital signs in last 24 hours: Temp:  [98.3 F (36.8 C)-100 F (37.8 C)] 98.3 F (36.8 C) (06/04 0822) Pulse Rate:  [82-103] 84 (06/04 1130) Resp:  [18-20] 18 (06/04 0900) BP: (153-200)/(64-84) 182/75 mmHg (06/04 1130) SpO2:  [97 %-99 %] 97 % (06/04 0500) Weight:  [225 lb 15.5 oz (102.5 kg)] 225  lb 15.5 oz (102.5 kg) (06/04 [id number])  Did not examine  Lab Results  Recent Labs  06/27/13 1120 06/28/13 0420  WBC 12.2* 14.5*  HGB 7.8* 7.9*  HCT 23.4* 23.3*  NA 134* 134*  K 3.8 3.8  CL 97 96  CO2 24 23  BUN 38* 45*  CREATININE 7.03* 7.97*    Microbiology: 5/25 blood cx ngtd Studies/Results: No results found.   Assessment/Plan: MSSA necrotising fasciitis/deep tissue infection of right leg s/p aka  = would give 2 wk of cefazolin as "mop up" to deep tissue infection. Can give with HD. Will sign off.    [facility] Cell: [phone] Pager: [phone]  06/28/2013, 11:39 AM

## 2013-06-28 NOTE — Plan of Care (Signed)
Problem: Acute Rehab PT Goals(only PT should resolve) Goal: Pt Will Ambulate Outcome: Not Met (add Reason) Pt with new R AKA.  Focus on acute care PT will be bed mobility, strengthening, and transfers.

## 2013-06-28 NOTE — Progress Notes (Signed)
ANTIBIOTIC and ANTICOAGULANT CONSULT NOTE - INITIAL  Pharmacy Consult for ancef; heparin--> LMWH, coumadin Indication: cellulitis; RUE DVT and hx PE  No Known Allergies  Patient Measurements: Height: 6\' 1"  (185.4 cm) Weight: 225 lb 15.5 oz (102.5 kg) IBW/kg (Calculated) : 79.9   Vital Signs: Temp: 98.3 F (36.8 C) (06/04 0822) Temp src: Oral (06/04 0822) BP: 160/71 mmHg (06/04 0836) Pulse Rate: 85 (06/04 0836) Intake/Output from previous day: 06/03 0701 - 06/04 0700 In: 880 [P.O.:480; I.V.:400] Out: 50 [Blood:50] Intake/Output from this shift:    Labs:  Recent Labs  06/27/13 0530 06/27/13 1120 06/28/13 0420  WBC 14.1* 12.2* 14.5*  HGB 8.3* 7.8* 7.9*  PLT 296 263 332  CREATININE  --  7.03* 7.97*   Estimated Creatinine Clearance: 15 ml/min (by C-G formula based on Cr of 7.97). No results found for this basename: VANCOTROUGH, VANCOPEAK, VANCORANDOM, GENTTROUGH, GENTPEAK, GENTRANDOM, TOBRATROUGH, TOBRAPEAK, TOBRARND, AMIKACINPEAK, AMIKACINTROU, AMIKACIN,  in the last 72 hours   Microbiology: Recent Results (from the past 720 hour(s))  CULTURE, BLOOD (ROUTINE X 2)     Status: None   Collection Time    06/02/13 10:50 PM      Result Value Ref Range Status   Specimen Description BLOOD LEFT ARM   Final   Special Requests BOTTLES DRAWN AEROBIC AND ANAEROBIC 10CC EACH   Final   Culture  Setup Time     Final   Value: 06/03/2013 12:30     Performed at [facility]   Culture     Final   Value: NO GROWTH 5 DAYS     Performed at [facility]   Report Status 06/09/2013 FINAL   Final  CULTURE, BLOOD (ROUTINE X 2)     Status: None   Collection Time    06/02/13 10:55 PM      Result Value Ref Range Status   Specimen Description BLOOD LEFT HAND   Final   Special Requests BOTTLES DRAWN AEROBIC ONLY 10CC   Final   Culture  Setup Time     Final   Value: 06/03/2013 12:30     Performed at [facility]   Culture     Final   Value: NO GROWTH 5 DAYS   Performed at [facility]   Report Status 06/09/2013 FINAL   Final  CULTURE, ROUTINE-ABSCESS     Status: None   Collection Time    06/03/13 12:20 AM      Result Value Ref Range Status   Specimen Description ABSCESS RIGHT FOOT   Final   Special Requests ANKLE PATIENT ON FOLLOWING ZOSYN VANCOMYCIN   Final   Gram Stain     Final   Value: ABUNDANT WBC PRESENT, PREDOMINANTLY PMN     RARE SQUAMOUS EPITHELIAL CELLS PRESENT     ABUNDANT GRAM POSITIVE COCCI IN PAIRS     IN CLUSTERS     Performed at [facility]   Culture     Final   Value: MODERATE STAPHYLOCOCCUS AUREUS     Note: RIFAMPIN AND GENTAMICIN SHOULD NOT BE USED AS SINGLE DRUGS FOR TREATMENT OF STAPH INFECTIONS. This organism is presumed to be Clindamycin resistant based on detection of inducible Clindamycin resistance.     Performed at [facility]   Report Status 06/05/2013 FINAL   Final   Organism ID, Bacteria STAPHYLOCOCCUS AUREUS   Final  ANAEROBIC CULTURE     Status: None   Collection Time    06/03/13 12:20 AM  Result Value Ref Range Status   Specimen Description ABSCESS RIGHT FOOT   Final   Special Requests ANKLE PATIENT ON FOLLOWING ZOSYN VANCOMYCIN   Final   Gram Stain     Final   Value: ABUNDANT WBC PRESENT, PREDOMINANTLY PMN     RARE SQUAMOUS EPITHELIAL CELLS PRESENT     ABUNDANT GRAM POSITIVE COCCI IN PAIRS     IN CLUSTERS     Performed at [facility]   Culture     Final   Value: NO ANAEROBES ISOLATED     Performed at [facility]   Report Status 06/08/2013 FINAL   Final  MRSA PCR SCREENING     Status: None   Collection Time    06/03/13  3:35 AM      Result Value Ref Range Status   MRSA by PCR NEGATIVE  NEGATIVE Final   Comment:            The GeneXpert MRSA Assay (FDA     approved for NASAL specimens     only), is one component of a     comprehensive MRSA colonization     surveillance program. It is not     intended to diagnose MRSA     infection nor to guide  or     monitor treatment for     MRSA infections.  CULTURE, ROUTINE-ABSCESS     Status: None   Collection Time    06/04/13  5:59 PM      Result Value Ref Range Status   Specimen Description ABSCESS ANKLE RIGHT   Final   Special Requests PATIENT ON FOLLOWING VANCOMYCIN, UNASYN   Final   Gram Stain     Final   Value: ABUNDANT WBC PRESENT,BOTH PMN AND MONONUCLEAR     NO SQUAMOUS EPITHELIAL CELLS SEEN     FEW GRAM POSITIVE COCCI     IN PAIRS IN CLUSTERS     Performed at [facility]   Culture     Final   Value: MODERATE STAPHYLOCOCCUS AUREUS     Note: RIFAMPIN AND GENTAMICIN SHOULD NOT BE USED AS SINGLE DRUGS FOR TREATMENT OF STAPH INFECTIONS. This organism is presumed to be Clindamycin resistant based on detection of inducible Clindamycin resistance.     Performed at [facility]   Report Status 06/07/2013 FINAL   Final   Organism ID, Bacteria STAPHYLOCOCCUS AUREUS   Final  ANAEROBIC CULTURE     Status: None   Collection Time    06/04/13  5:59 PM      Result Value Ref Range Status   Specimen Description ABSCESS ANKLE RIGHT   Final   Special Requests PATIENT ON FOLLOWING VANCOMYCIN, UNASYN   Final   Gram Stain     Final   Value: ABUNDANT WBC PRESENT,BOTH PMN AND MONONUCLEAR     NO SQUAMOUS EPITHELIAL CELLS SEEN     FEW GRAM POSITIVE COCCI     IN PAIRS IN CLUSTERS     Performed at [facility]   Culture     Final   Value: NO ANAEROBES ISOLATED     Performed at [facility]   Report Status 06/09/2013 FINAL   Final  ANAEROBIC CULTURE     Status: None   Collection Time    06/04/13  6:07 PM      Result Value Ref Range Status   Specimen Description ABSCESS RIGHT LEG   Final   Special Requests  Final   Value: PATIENT ON FOLLOWING VANCOMYCIN UNASYN CULTURES FROM RIGHT CALF   Gram Stain     Final   Value: FEW WBC PRESENT,BOTH PMN AND MONONUCLEAR     NO SQUAMOUS EPITHELIAL CELLS SEEN     FEW GRAM POSITIVE COCCI     IN PAIRS     Performed at  [facility]   Culture     Final   Value: NO ANAEROBES ISOLATED     Performed at [facility]   Report Status 06/09/2013 FINAL   Final  CULTURE, ROUTINE-ABSCESS     Status: None   Collection Time    06/04/13  6:07 PM      Result Value Ref Range Status   Specimen Description ABSCESS RIGHT LEG   Final   Special Requests     Final   Value: PATIENT ON FOLLOWING VANCOMYCIN UNASYN CULTURES FROM RIGHT CALF   Gram Stain     Final   Value: RARE WBC PRESENT,BOTH PMN AND MONONUCLEAR     NO SQUAMOUS EPITHELIAL CELLS SEEN     FEW GRAM POSITIVE COCCI     IN PAIRS     Performed at [facility]   Culture     Final   Value: MODERATE STAPHYLOCOCCUS AUREUS     Note: SUSCEPTIBILITIES PERFORMED ON PREVIOUS CULTURE WITHIN THE LAST 5 DAYS.     Performed at [facility]   Report Status 06/07/2013 FINAL   Final  CULTURE, BLOOD (ROUTINE X 2)     Status: None   Collection Time    06/10/13  6:29 AM      Result Value Ref Range Status   Specimen Description BLOOD   Final   Special Requests     Final   Value: BOTTLES DRAWN AEROBIC ONLY LEFT INTERNAL JUGULAR TLC DISTAL PORT  10CC   Culture  Setup Time     Final   Value: 06/10/2013 14:40     Performed at [facility]   Culture     Final   Value: NO GROWTH 5 DAYS     Performed at [facility]   Report Status 06/15/2013 FINAL   Final  CLOSTRIDIUM DIFFICILE BY PCR     Status: None   Collection Time    06/17/13  1:09 PM      Result Value Ref Range Status   C difficile by pcr NEGATIVE  NEGATIVE Final  CULTURE, BLOOD (ROUTINE X 2)     Status: None   Collection Time    06/18/13  9:15 AM      Result Value Ref Range Status   Specimen Description BLOOD HEMODIALYSIS FISTULA   Final   Special Requests BOTTLES DRAWN AEROBIC AND ANAEROBIC 10CC   Final   Culture  Setup Time     Final   Value: 06/18/2013 16:16     Performed at [facility]   Culture     Final   Value: NO GROWTH 5 DAYS     Performed at  [facility]   Report Status 06/24/2013 FINAL   Final  CULTURE, BLOOD (ROUTINE X 2)     Status: None   Collection Time    06/18/13  9:35 AM      Result Value Ref Range Status   Specimen Description BLOOD HEMODIALYSIS FISTULA   Final   Special Requests BOTTLES DRAWN AEROBIC AND ANAEROBIC 10CC   Final   Culture  Setup Time  Final   Value: 06/18/2013 16:17     Performed at [facility]   Culture     Final   Value: NO GROWTH 5 DAYS     Performed at [facility]   Report Status 06/24/2013 FINAL   Final  CLOSTRIDIUM DIFFICILE BY PCR     Status: None   Collection Time    06/20/13  5:48 PM      Result Value Ref Range Status   C difficile by pcr NEGATIVE  NEGATIVE Final  SURGICAL PCR SCREEN     Status: None   Collection Time    06/27/13  1:31 AM      Result Value Ref Range Status   MRSA, PCR NEGATIVE  NEGATIVE Final   Staphylococcus aureus NEGATIVE  NEGATIVE Final   Comment:            The Xpert SA Assay (FDA     approved for NASAL specimens     in patients over 61 years of age),     is one component of     a comprehensive surveillance     program.  Test performance has     been validated by [employer] for patients greater     than or equal to 31 year old.     It is not intended     to diagnose infection nor to     guide or monitor treatment.    Medical History: Past Medical History  Diagnosis Date  . Eczema   . Morbid obesity   . Gout   . HTN (hypertension)   . Dyslipidemia   . Pulmonary embolism     3  in  lungs  at  one time...  . Abscess     great toe  . Pancreatitis   . Arthritis   . Depression   . Diabetes mellitus   . Anemia   . Pneumonia     2-3 years ago  . Shortness of breath     ?? chest  cold he has now.  10/8- no SOB  . Renal hypertension     Hemo started  Sept 2014- MWF  . Chronic kidney disease     esrd    Assessment: [name] M with RLE hematoma now s/p right AKA on 6/3.  OK to resume  anticoagulation today per Dr. Sharol Given for new RUE DVT and hx PE.  With plan to discharge patient soon, to convert patient to [place] today. INR 1.27 today. Will also change abx to ancef for MSSA ankle abscess per recom. from ID.  Goal of Therapy:  INR 2-3  Plan:  1) d/c heparin, start lovenox 100mg  SQ q24h one hour after herparin drip has been d/ced-- will Monitor closely for s/s of bleeding 2) coumadin 5mg  PO x1  3) Ancef 2gm after each HD (will enter dose based on HD schedule)  Steven Logan P Mashonda Broski 06/28/2013,8:52 AM

## 2013-06-28 NOTE — Progress Notes (Signed)
[facility] Progress Note  Assessment/Plan: 1. 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 Anemia d/t ABL/ esrd- s/p 3 U PRBC so far, cont aranesp 100 given 5/29 increased to 150 ordered for 6/5 next dose - need to be sure he gets it if HD is off schedule; and IV Fe Hgb 8.3 > 7..9>7.8 stable post op 3. HTPH- cont vit D, renvela ^ to 3 ac due to ^ P, Corrected Ca 9.3 - will resume calcitriol at 1 mcg  6/4 - need to recheck P 4. RUE DVT- anticoagulation on hold due to large stump hematoma/surgery - to be resumed today 6/4 per pharmacy   5. HTN/vol  on labetalol po; BP up but vol doesn't seem excess on exam - BP with leg cuff. 6. ESRD MWF -Wed HD done Thursday due to surgery Wed; next HD Sat then get back on schedule next week;  no heparin; K 3.8 use 4 K bath 7. Protein malnutrition - Alb 1.2 - missing meals due to multiple procedures/HD and poor appetite - seen by RD 6/3 diet liberalized, nepro supll and prostat - need to make every effort to be sure he does not miss meals. - talked with pt regarding importance of nutrition and wound healing  Myriam Jacobson, PA-C [place] [phone] 06/28/2013,9:35 AM  LOS: 26 days   Pt seen, examined and agree w A/P as above.  Kelly Splinter MD pager [phone]    cell [phone] 06/28/2013, 11:52 AM    Subjective:   Still some pain but not as bad. Loose stools on an off  Objective Filed Vitals:   06/28/13 0822 06/28/13 0836 06/28/13 0841 06/28/13 0900  BP: 170/73 160/71 157/69 165/72  Pulse: 85 85 86 83  Temp: 98.3 F (36.8 C)     TempSrc: Oral     Resp: 18 18  18   Height:      Weight: 102.5 kg (225 lb 15.5 oz)     SpO2:       Physical Exam BP being taking on left LE General: NAD on HD spirits good Heart:RRR Lungs: no wheezes or rales Abdomen: soft NT Extremities: Left LE no edema; right AKA wrapped Dialysis Access: left AVF  Dialysis Orders:  HD: [facility]WF [facility]  4.5h 111.5kg 2/2.5 Bath 500/800 Heparin-- none for now due to anemia/ hematoma (was on 11000) L AVF use 14 needles Calcitriol 1.25ug EPO 8000 Venofer s/p load completed 5/11    Additional Objective Labs: Basic Metabolic Panel:  Recent Labs Lab 06/22/13 0500  06/25/13 0800 06/27/13 1120 06/28/13 0420  NA 132*  < > 133* 134* 134*  K 4.0  < > 3.9 3.8 3.8  CL 94*  --  95* 97 96  CO2 25  --  23 24 23   GLUCOSE 107*  < > 116* 119* 122*  BUN 29*  --  37* 38* 45*  CREATININE 6.45*  --  7.85* 7.03* 7.97*  CALCIUM 7.2*  --  7.0* 7.3* 7.1*  PHOS 6.8*  --  7.6*  --   --   < > = values in this interval not displayed. Liver Function Tests:  Recent Labs Lab 06/22/13 0500 06/25/13 0800  ALBUMIN 1.3* 1.2*   CBC:  Recent Labs Lab 06/23/13 0510  06/26/13 0030 06/26/13 0500 06/27/13 0530 06/27/13 1120 06/28/13 0420  WBC 18.5*  < > 14.0* 14.5* 14.1* 12.2* 14.5*  NEUTROABS 11.0*  --   --   --   --   --   --  HGB 7.2*  < > 7.1* 7.4* 8.3* 7.8* 7.9*  HCT 23.1*  < > 21.1* 22.2* 24.5* 23.4* 23.3*  MCV 91.7  < > 87.6 87.7 87.5 87.6 88.3  PLT 381  < > 285 287 296 263 332  < > = values in this interval not displayed. BCBG:  Recent Labs Lab 06/27/13 1009 06/27/13 1133 06/27/13 1720 06/27/13 2142 06/28/13 0727  GLUCAP 103* 115* 113* 128* 129*  Medications: . sodium chloride 10 mL/hr at 06/22/13 2025  . sodium chloride 10 mL/hr at 06/27/13 1256  . heparin     . darbepoetin (ARANESP) injection - DIALYSIS  150 mcg Intravenous Q Fri-HD  . docusate sodium  100 mg Oral BID  . feeding supplement (NEPRO CARB STEADY)  237 mL Oral BID BM  . feeding supplement (PRO-STAT SUGAR FREE 64)  30 mL Oral TID WC  . ferric gluconate (FERRLECIT/NULECIT) IV  125 mg Intravenous Q M,W,F-HD  . gabapentin  100 mg Oral TID  . insulin aspart  0-20 Units Subcutaneous TID WC  . labetalol  200 mg Oral BID  . multivitamin  1 tablet Oral QHS  . sevelamer carbonate  1,600 mg Oral TID WC  .  sodium chloride  10-40 mL Intracatheter Q12H

## 2013-06-28 NOTE — Progress Notes (Signed)
Physical Therapy Treatment Patient Details Name: Steven Logan MRN: [id number] DOB: Jul 15, 1970 Today's Date: 06/28/2013    History of Present Illness Pt admitted 06/02/13 with sepsis and R LE abscess and cellulitis.  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  Pt with ESRD and DM.  Pt underwent R AKA on 06/27/13 and re-evaluated today with no significant change in functional status.    PT Comments    Pt re-evaluated after new R AKA on 06/27/13.  Pt with high level of pain and anxiety at session.  All goals still appropriate except gait goal and will d/c that goal.  Pt educated on proper positioning in bed to prevent hip contracture as well as protecting of L foot.   Also provided education on importance of touching residual limb to assist in de-sensitizing and phantom pain.   Follow Up Recommendations  SNF     Equipment Recommendations  None recommended by PT    Recommendations for Other Services       Precautions / Restrictions Precautions Precautions: Fall Restrictions RLE Weight Bearing: Non weight bearing Other Position/Activity Restrictions: s/p R AKA (06/27/13)    Mobility  Bed Mobility Overal bed mobility: Needs Assistance             General bed mobility comments: Pt in too much pain to attempt rolling.  Was agreeable for scooting up in bed and re-positioning which he performed with MIN/MOD A of draw pad while he was using L LE and hands above head board.  Transfers                    Ambulation/Gait                 Stairs            Wheelchair Mobility    Modified Rankin (Stroke Patients Only)       Balance       Sitting balance - Comments: unable to attempt due to pain.                            Cognition Arousal/Alertness: Awake/alert Behavior During Therapy: WFL for tasks assessed/performed Overall Cognitive Status: Within Functional Limits for tasks assessed                 General  Comments: Pt is very anxious regarding situation.    Exercises      General Comments        Pertinent Vitals/Pain 9/10 R thigh    Home Living                      Prior Function            PT Goals (current goals can now be found in the care plan section) Acute Rehab PT Goals PT Goal Formulation: With patient Time For Goal Achievement: 07/12/13 Potential to Achieve Goals: Good Progress towards PT goals: Not progressing toward goals - comment;Goals downgraded-see care plan (removed gait goal- all other goals appropriate)    Frequency  Min 4X/week    PT Plan Current plan remains appropriate    Co-evaluation             End of Session   Activity Tolerance: Patient limited by pain Patient left: in bed;with family/visitor present     Time: [id number] PT Time Calculation (min): 16 min  Charges:  G Codes:      Galen Manila 06/28/2013, 3:40 PM

## 2013-06-28 NOTE — Progress Notes (Signed)
Patient ID: Steven Logan, male   DOB: 1970-10-07, 44 y.o.   MRN: [id number] Postoperative day 1 right above-the-knee amputation. While there was no ischemic muscle changes the muscle did not have good contractility. It had good color and good consistency. Okay to resume anticoagulation therapy today. Anticipate patient is safe for discharge to skilled nursing.

## 2013-06-28 NOTE — Procedures (Signed)
I was present at this dialysis session, have reviewed the session itself and made  appropriate changes  Kelly Splinter MD (pgr) [phone]    [phone] 06/28/2013, 11:38 AM

## 2013-06-29 ENCOUNTER — Encounter (HOSPITAL_COMMUNITY): Payer: Self-pay | Admitting: Orthopedic Surgery

## 2013-06-29 ENCOUNTER — Inpatient Hospital Stay (HOSPITAL_COMMUNITY): Payer: BC Managed Care – PPO

## 2013-06-29 LAB — CBC
HCT: 24.7 % — ABNORMAL LOW (ref 39.0–52.0)
Hemoglobin: 8.1 g/dL — ABNORMAL LOW (ref 13.0–17.0)
MCH: 29.6 pg (ref 26.0–34.0)
MCHC: 32.8 g/dL (ref 30.0–36.0)
MCV: 90.1 fL (ref 78.0–100.0)
PLATELETS: 346 10*3/uL (ref 150–400)
RBC: 2.74 MIL/uL — AB (ref 4.22–5.81)
RDW: 14.7 % (ref 11.5–15.5)
WBC: 13.7 10*3/uL — ABNORMAL HIGH (ref 4.0–10.5)

## 2013-06-29 LAB — GLUCOSE, CAPILLARY
Glucose-Capillary: 124 mg/dL — ABNORMAL HIGH (ref 70–99)
Glucose-Capillary: 109 mg/dL — ABNORMAL HIGH (ref 70–99)
Glucose-Capillary: 99 mg/dL (ref 70–99)
Glucose-Capillary: 102 mg/dL — ABNORMAL HIGH (ref 70–99)

## 2013-06-29 LAB — PROTIME-INR
INR: 1.2 (ref 0.00–1.49)
Prothrombin Time: 14.9 seconds (ref 11.6–15.2)

## 2013-06-29 MED ORDER — ACETAMINOPHEN 325 MG PO TABS: 650.0000 mg | ORAL_TABLET | Freq: Four times a day (QID) | ORAL | Status: DC | PRN

## 2013-06-29 MED ORDER — CEFAZOLIN SODIUM-DEXTROSE 2-3 GM-% IV SOLR: 2.0000 g | INTRAVENOUS | Status: DC

## 2013-06-29 MED ORDER — RENA-VITE PO TABS: 1.0000 | ORAL_TABLET | Freq: Every day | ORAL | Status: DC

## 2013-06-29 MED ORDER — METHOCARBAMOL 500 MG PO TABS: 500.0000 mg | ORAL_TABLET | Freq: Four times a day (QID) | ORAL | Status: DC | PRN

## 2013-06-29 MED ORDER — DSS 100 MG PO CAPS: 100.0000 mg | ORAL_CAPSULE | Freq: Two times a day (BID) | ORAL | Status: DC

## 2013-06-29 MED ORDER — DARBEPOETIN ALFA-POLYSORBATE 150 MCG/0.3ML IJ SOLN
150.0000 ug | INTRAMUSCULAR | Status: DC
  Administered 2013-06-30: 150 ug via INTRAVENOUS
  Filled 2013-06-29: qty 0.3

## 2013-06-29 MED ORDER — SEVELAMER CARBONATE 800 MG PO TABS: 2400.0000 mg | ORAL_TABLET | Freq: Three times a day (TID) | ORAL | Status: DC

## 2013-06-29 MED ORDER — GABAPENTIN 100 MG PO CAPS: 100.0000 mg | ORAL_CAPSULE | Freq: Three times a day (TID) | ORAL | Status: DC

## 2013-06-29 MED ORDER — CALCITRIOL 0.5 MCG PO CAPS: 1.0000 ug | ORAL_CAPSULE | ORAL | Status: DC

## 2013-06-29 MED ORDER — SODIUM CHLORIDE 0.9 % IV SOLN
125.0000 mg | INTRAVENOUS | Status: AC
  Administered 2013-06-30: 125 mg via INTRAVENOUS
  Filled 2013-06-29 (×3): qty 10

## 2013-06-29 MED ORDER — LORAZEPAM 2 MG/ML IJ SOLN: 0.5000 mg | Freq: Once | INTRAMUSCULAR | Status: AC | PRN

## 2013-06-29 MED ORDER — DARBEPOETIN ALFA-POLYSORBATE 150 MCG/0.3ML IJ SOLN
150.0000 ug | INTRAMUSCULAR | Status: DC
Start: 2013-07-06 — End: 2013-06-29

## 2013-06-29 MED ORDER — WARFARIN SODIUM 2.5 MG PO TABS: ORAL_TABLET | ORAL | Status: DC

## 2013-06-29 MED ORDER — LORAZEPAM 2 MG/ML IJ SOLN
0.5000 mg | Freq: Once | INTRAMUSCULAR | Status: AC
  Administered 2013-06-29: 0.5 mg via INTRAVENOUS
  Filled 2013-06-29: qty 1

## 2013-06-29 MED ORDER — CEFAZOLIN SODIUM-DEXTROSE 2-3 GM-% IV SOLR
2.0000 g | INTRAVENOUS | Status: DC
  Filled 2013-06-29: qty 50

## 2013-06-29 MED ORDER — OXYCODONE-ACETAMINOPHEN 5-325 MG PO TABS: 1.0000 | ORAL_TABLET | ORAL | Status: DC | PRN

## 2013-06-29 MED ORDER — WARFARIN SODIUM 7.5 MG PO TABS
7.5000 mg | ORAL_TABLET | Freq: Once | ORAL | Status: AC
  Administered 2013-06-29: 7.5 mg via ORAL
  Filled 2013-06-29: qty 1

## 2013-06-29 MED ORDER — DARBEPOETIN ALFA-POLYSORBATE 150 MCG/0.3ML IJ SOLN: 150.0000 ug | INTRAMUSCULAR | Status: DC

## 2013-06-29 MED ORDER — ENOXAPARIN SODIUM 100 MG/ML ~~LOC~~ SOLN: 100.0000 mg | SUBCUTANEOUS | Status: DC

## 2013-06-29 MED ORDER — CEFAZOLIN SODIUM-DEXTROSE 2-3 GM-% IV SOLR
2.0000 g | INTRAVENOUS | Status: DC
  Administered 2013-06-30 – 2013-07-02 (×2): 2 g via INTRAVENOUS
  Filled 2013-06-29 (×5): qty 50

## 2013-06-29 MED ORDER — NEPRO/CARBSTEADY PO LIQD: 237.0000 mL | Freq: Two times a day (BID) | ORAL | Status: DC

## 2013-06-29 MED ORDER — PRO-STAT SUGAR FREE PO LIQD: 30.0000 mL | Freq: Three times a day (TID) | ORAL | Status: DC

## 2013-06-29 NOTE — Progress Notes (Addendum)
ANTICOAGULATION & ANTIBIOTIC CONSULT NOTE - Follow Up Consult  Pharmacy Consult for Lovenox + Warfarin & Cefazolin Indication: Newly diagnosed RUE DVT + hx PE & MSSA deep tissue infection  No Known Allergies  Patient Measurements: Height: 6\' 1"  (185.4 cm) Weight: 219 lb 9.3 oz (99.6 kg) IBW/kg (Calculated) : 79.9  Vital Signs: Temp: 99.6 F (37.6 C) (06/05 0630) Temp src: Oral (06/05 0630) BP: 151/81 mmHg (06/05 0630) Pulse Rate: 90 (06/05 0630)  Labs:  Recent Labs  06/27/13 0530 06/27/13 1120 06/28/13 0420 06/29/13 0500  HGB 8.3* 7.8* 7.9* 8.1*  HCT 24.5* 23.4* 23.3* 24.7*  PLT 296 263 332 346  LABPROT 15.9*  --  15.6* 14.9  INR 1.30  --  1.27 1.20  CREATININE  --  7.03* 7.97*  --     Estimated Creatinine Clearance: 14.8 ml/min (by C-G formula based on Cr of 7.97).   Assessment: 56 YOM started on lovenox + warfarin for new RUE DVT diagnosed this admit. 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 Sharol Given)  Full-dose lovenox is not recommended in ESRD due to risk of accumulation/bleeding. This was discussed with Dr. [place] Pink and he wishes to proceed with monitoring of levels and s/sx of bleeding. INR today remains SUBtherapeutic (INR 1.2 << 1.27, goal of 2-3). Since Vit K given on 6/2 - will increase warfarin dose today.  The patient also continues on Cefazolin for a MSSA deep tissue infection s/p AKA on 6/3. ID plans for 2 more weeks for "mop-up" to deep tissue infection. The patient received HD off-schedule on 6/4 - Ancef dose given. Plans are to get back on regular HD schedule today - 6/5 [facility] MD discussed with Dr. Jonnie Finner)  - will enter doses appropriately.  Goal of Therapy:  INR 2-3 Anti-Xa level 0.6-1 units/ml 4hrs after LMWH dose given Monitor platelets by anticoagulation protocol: Yes Proper antibiotics for infection/cultures  adjusted for renal/hepatic function    Plan:  1. Continue Lovenox 100 mg SQ every 24 hours - will plan to check an anti-Xa level at steady state 2. Warfarin 7.5 mg x 1 dose at 1800 today 3. Cefazolin 2g post HD-MWF (to get back on schedule today - 6/5) 4. Will continue to monitor for any signs/symptoms of bleeding and will follow up with PT/INR in the a.m.  5. Will continue to follow HD schedule/duration for antibiotic dose appropriateness  Alycia Rossetti, PharmD, BCPS Clinical Pharmacist Pager: [phone] 06/29/2013 8:13 AM

## 2013-06-29 NOTE — Progress Notes (Signed)
Subjective:  Comfortable, no complaints, pain well controlled  Objective: Vital signs in last 24 hours: Temp:  [98.3 F (36.8 C)-100.9 F (38.3 C)] 99.6 F (37.6 C) (06/05 0630) Pulse Rate:  [82-96] 90 (06/05 0630) Resp:  [16-18] 16 (06/05 0630) BP: (151-183)/(63-85) 151/81 mmHg (06/05 0630) SpO2:  [99 %-100 %] 99 % (06/05 0630) Weight:  [99.6 kg (219 lb 9.3 oz)-102.5 kg (225 lb 15.5 oz)] 99.6 kg (219 lb 9.3 oz) (06/04 1247) Weight change:   Intake/Output from previous day: 06/04 0701 - 06/05 0700 In: 360 [P.O.:360] Out: 1996    Lab Results:  Recent Labs  06/28/13 0420 06/29/13 0500  WBC 14.5* 13.7*  HGB 7.9* 8.1*  HCT 23.3* 24.7*  PLT 332 346   BMET:  Recent Labs  06/27/13 1120 06/28/13 0420  NA 134* 134*  K 3.8 3.8  CL 97 96  CO2 24 23  GLUCOSE 119* 122*  BUN 38* 45*  CREATININE 7.03* 7.97*  CALCIUM 7.3* 7.1*   No results found for this basename: PTH,  in the last 72 hours Iron Studies: No results found for this basename: IRON, TIBC, TRANSFERRIN, FERRITIN,  in the last 72 hours  Studies/Results: No results found.  EXAM: General appearance:  Alert, in no apparent distress Resp:  CTA without rales, rhonchi, or wheezes Cardio:  RRR without murmur or rub GI:  + BS, soft and nontender Extremities:  R AKA wrapped, L LE with no edema Access:  AVF @ LFA with + bruit   Dialysis Orders: HD: MWF [facility]  4.5h 111.5kg 2/2.5 Bath 500/800 Heparin-- none for now due to anemia/ hematoma (was on 11000) L AVF use 14 needles Calcitriol 1.25ug EPO 8000 Venofer s/p load completed 5/11   Assessment/Plan: 1. Necrotizing fasciitis R leg - s/p BKA 5/19, AKA 6/3 per Dr. Sharol Given; off antibiotics. 2. RUE DVT - anticoagulation restarted per pharmacy. 3. ESRD - HD on MWF @ [place], K 3.8. Plan HD today to get back on schedule 4. HTN/Volume - BP 151/81 on Labetalol 200 mg bid; wt 99.6 kg s/p net UF 2 L yesterday.  Lower EDW. BP up, max UF w HD today 5. Anemia - Hgb up to 8.1, s/p 3 U  PRBCs, Aranesp 100 mcg, increased to 150 mcg tomorrow, Fe per HD. 6. Sec HPT - Calcitriol 1 mcg, Renvela 3 with meals.   7. Nutrition - Alb 1.2, appetite improving, regular diet with Nepro & Prostat, multivitamin 8. Fevers- persistent fevers, ID following, ordered MRI of spine   LOS: 27 days   Ramiro Harvest 06/29/2013,7:52 AM  Pt seen, examined, agree w assess/plan as above with additions as indicated.  Kelly Splinter MD pager [phone]    cell [phone] 06/29/2013, 10:11 AM

## 2013-06-29 NOTE — Progress Notes (Signed)
[facility]  PROGRESS NOTE  TYDARRIUS GAMMELL [id number] DOB: [birth year] DOA: 06/02/2013  PCP: Tivis Ringer, MD  Brief narrative: 43 year old male with past medical history of gout, HTN, DLD, PE, Pancreatitis, DM who presented to [facility] ED 06/02/2013 with worsening right ankle swelling for 2 weeks prior to this admission. Patient was started on prednisone for gout treatment initially which did not help. His wound opened up and started to have drainage. Patient subsequently re-presented to the ER. An MRI of the ankle revealed gas-containing abscess and cellulitis. Orthopedics took him emergently to the OR for debridement. He underwent right lower extremity transtibial amputation and remained on IV Zosyn. He continued to have fever post  surgery. ID was consulted. Patient was also started on Vancomycin.  He was diagnosed with acute nonocclusive DVT right upper extremity. 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 He had repeated CT LE due to persistent fever which show a large hematoma. Heparin has been on hold. He has received multiple Blood transfusion. 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 Cultures from wound initially grew MSSA. Vanc was discontinued by ID. Zosyn was continued. ID was following and recommended changing to Cefazolin post surgery. He will need this for 2 weeks. However patient had 2 episodes of fever overnight upto 100.9. WBC not much different. Discussed with Dr. Baxter Flattery. She is concerned about possible spread even though patient remains asymptomatic except for pain in his stump. So we will proceed with MRI of thoracic and Lumbar spine. If that is negative, he should be able to go to SNF.  Fever C diff negative. Chest x ray showed atelectasis. Blood culture 5-25 no  growth to date. CT LE showed large, possibly infected, hematoma. Now status post right AKA. Low grade fevers noted.  Diarrhea Better after stopping laxatives.   Acute nonocclusive DVT RUE  Jugular, subclavian, axillary, and brachial veins involved. Has had recent removal of HD cath from right IJ 5/10 (which has been in place since Aug 2014). He was started on Heparin and coumadin. These had to be held for surgery and then resumed. Heparin switched to Lovenox.   Anemia in chronic kidney disease / acute postoperative blood loss anemia  This was due to bleeding into right stump. Now s/p Right AKA. Has received multiple blood transfusions (At least 9 units of PRBC). Hgb remains stable.  Encephalopathy, toxic  Resolved.   End stage renal disease on HD M/W/F  Per Nephrology. Has fistula left UE. Catch up dialysis today per Dr. Jonnie Finner.  Hypertension  BP is better now. Continue current medications. Monitor for now without additional changes.   DM / Hyperglycemia  A1c 7.0; good glycemic control. Had hypoglycemic episodes and Lantus was discontinued. No further episodes of hypoglycemia. CBG less than 150. No LA insulin for now.  Hyperparathyroidism Continue Calcitrol and renvela   DVT Prophylaxis: On warfarin/lovenox.  Code Status: full code Family Communication: no family at the bedside  Disposition Plan: SNF when ready. Possibly 6/6.   Consultants:   Ortho   Nephro   ID  Procedures:   I&D on 06/03/2013   IRRIGATION AND DEBRIDEMENT EXTREMITY extensive excision of muscle fascia and soft tissue right leg 06/06/13   Transtibial amputation 5/19   I&D 5-31  Right BKA 5/19  Right AKA 6/3    Antibiotics:   Vanc 5/9 >5/17,  5-29-->6/2  Zosyn 5/9 > 6/4  Cefazolin 6/4-->  HPI/Subjective: Patient feels well. Denies any complaints. Pain is better.   Objective: Filed Vitals:   06/28/13 1802 06/28/13 2203 06/29/13 0630 06/29/13 0924  BP: 176/63 167/85 151/81 151/84   Pulse: 96 91 90 97  Temp: 100.9 F (38.3 C) 100.6 F (38.1 C) 99.6 F (37.6 C) 98.9 F (37.2 C)  TempSrc: Axillary Oral Oral Oral  Resp: 16 17 16 18   Height:      Weight:      SpO2: 100% 100% 99% 93%    Intake/Output Summary (Last 24 hours) at 06/29/13 1247 Last data filed at 06/29/13 0920  Gross per 24 hour  Intake    480 ml  Output      0 ml  Net    480 ml    Exam:   General:  Pt is alert, not in acute distress  Cardiovascular: Regular rate and rhythm, S1/S2 appreciated   Respiratory: Clear to auscultation bilaterally, no wheezing  Abdomen: Soft, non tender, non distended, bowel sounds present  Extremities: right LE stump covered with dressing.   Neuro: Grossly nonfocal  Data Reviewed: Basic Metabolic Panel:  Recent Labs Lab 06/24/13 0747 06/25/13 0800 06/27/13 1120 06/28/13 0420  NA 135* 133* 134* 134*  K 3.5* 3.9 3.8 3.8  CL  --  95* 97 96  CO2  --  23 24 23   GLUCOSE 114* 116* 119* 122*  BUN  --  37* 38* 45*  CREATININE  --  7.85* 7.03* 7.97*  CALCIUM  --  7.0* 7.3* 7.1*  PHOS  --  7.6*  --   --    Liver Function Tests:  Recent Labs Lab 06/25/13 0800  ALBUMIN 1.2*   CBC:  Recent Labs Lab 06/23/13 0510  06/26/13 0500 06/27/13 0530 06/27/13 1120 06/28/13 0420 06/29/13 0500  WBC 18.5*  < > 14.5* 14.1* 12.2* 14.5* 13.7*  NEUTROABS 11.0*  --   --   --   --   --   --   HGB 7.2*  < > 7.4* 8.3* 7.8* 7.9* 8.1*  HCT 23.1*  < > 22.2* 24.5* 23.4* 23.3* 24.7*  MCV 91.7  < > 87.7 87.5 87.6 88.3 90.1  PLT 381  < > 287 296 263 332 346  < > = values in this interval not displayed.  CBG:  Recent Labs Lab 06/27/13 2142 06/28/13 0727 06/28/13 1740 06/28/13 2159 06/29/13 0743  GLUCAP 128* 129* 128* 115* 124*    Recent Results (from the past 240 hour(s))  CLOSTRIDIUM DIFFICILE BY PCR     Status: None   Collection Time    06/20/13  5:48 PM      Result Value Ref Range Status   C difficile by pcr NEGATIVE  NEGATIVE Final  SURGICAL PCR  SCREEN     Status: None   Collection Time    06/27/13  1:31 AM      Result Value Ref Range Status   MRSA, PCR NEGATIVE  NEGATIVE Final   Staphylococcus aureus NEGATIVE  NEGATIVE Final   Comment:            The Xpert SA Assay (FDA     approved for NASAL specimens     in patients over 60 years of age),     is one component of     a comprehensive surveillance     program.  Test  performance has     been validated by [employer] for patients greater     than or equal to 12 year old.     It is not intended     to diagnose infection nor to     guide or monitor treatment.     Studies: No results found.  Scheduled Meds: . calcitRIOL  1 mcg Oral Q T,Th,Sa-HD  . [START ON 07/02/2013] calcitRIOL  1 mcg Oral Q M,W,F-HD  .  ceFAZolin (ANCEF) IV  2 g Intravenous Q M,W,F-HD  . [START ON 06/30/2013] darbepoetin (ARANESP) injection - DIALYSIS  150 mcg Intravenous Q Sat-HD  . docusate sodium  100 mg Oral BID  . enoxaparin (LOVENOX) injection  100 mg Subcutaneous Q24H  . feeding supplement (NEPRO CARB STEADY)  237 mL Oral BID BM  . feeding supplement (PRO-STAT SUGAR FREE 64)  30 mL Oral TID WC  . ferric gluconate (FERRLECIT/NULECIT) IV  125 mg Intravenous Q M,W,F-HD  . gabapentin  100 mg Oral TID  . insulin aspart  0-20 Units Subcutaneous TID WC  . labetalol  200 mg Oral BID  . multivitamin  1 tablet Oral QHS  . sevelamer carbonate  2,400 mg Oral TID WC  . sodium chloride  10-40 mL Intracatheter Q12H  . warfarin  7.5 mg Oral ONCE-1800  . Warfarin - Pharmacist Dosing Inpatient   Does not apply q1800   Continuous Infusions: . sodium chloride 10 mL/hr at 06/22/13 2025  . sodium chloride 10 mL/hr at 06/27/13 1256     Bonnielee Haff MD  [facility] Pager [phone]  If 7PM-7AM, please contact night-coverage [email or web address] Password [email or web address] 06/22/2013, 8:18 AM   LOS: 20 days

## 2013-06-29 NOTE — Clinical Social Work Note (Addendum)
Patient will discharge to [facility] on Saturday, 6/6. Insurance pre-authorization received and patient informed of Saturday discharge.  Handoff completed for weekend CSW to facilitate discharge.  Rudolfo Brandow Givens, MSW, LCSW [phone]

## 2013-06-29 NOTE — Progress Notes (Signed)
[place] for Infectious Disease    Date of Admission:  06/02/2013      ID: Steven Logan is a 43 y.o. male with  MSSA nec fasciitis/deep tissue infection s/p bka but still had ongoing compromise of viable tissue POD#2 AKA. Remains afebrile Principal Problem:   Severe sepsis Active Problems:   HTN (hypertension)   Pulmonary embolism, bilateral   Abscess of right lower leg   Encephalopathy, toxic   Anemia in chronic kidney disease   Arm edema   Leg abscess   Septic shock   Septic arthritis of ankle or foot, right   ESRD on dialysis   Hyperglycemia   Arm DVT (deep venous thromboembolism), acute   Acute blood loss anemia    Subjective: Fever yesterday up to 100.9. He was unable to do mri today due to feeling claustophobic  Medications:  . [START ON 07/02/2013] calcitRIOL  1 mcg Oral Q M,W,F-HD  . [START ON 06/30/2013]  ceFAZolin (ANCEF) IV  2 g Intravenous Q M,W,F-HD  . [START ON 06/30/2013] darbepoetin (ARANESP) injection - DIALYSIS  150 mcg Intravenous Q Sat-HD  . docusate sodium  100 mg Oral BID  . enoxaparin (LOVENOX) injection  100 mg Subcutaneous Q24H  . feeding supplement (NEPRO CARB STEADY)  237 mL Oral BID BM  . feeding supplement (PRO-STAT SUGAR FREE 64)  30 mL Oral TID WC  . [START ON 06/30/2013] ferric gluconate (FERRLECIT/NULECIT) IV  125 mg Intravenous Q M,W,F-HD  . gabapentin  100 mg Oral TID  . insulin aspart  0-20 Units Subcutaneous TID WC  . labetalol  200 mg Oral BID  . multivitamin  1 tablet Oral QHS  . sevelamer carbonate  2,400 mg Oral TID WC  . sodium chloride  10-40 mL Intracatheter Q12H  . warfarin  7.5 mg Oral ONCE-1800  . Warfarin - Pharmacist Dosing Inpatient   Does not apply q1800    Objective: Vital signs in last 24 hours: Temp:  [98.9 F (37.2 C)-100.9 F (38.3 C)] 99.3 F (37.4 C) (06/05 1256) Pulse Rate:  [90-97] 93 (06/05 1256) Resp:  [16-18] 18 (06/05 1256) BP: (151-196)/(63-106) 196/106 mmHg (06/05 1256) SpO2:  [93 %-100 %]  99 % (06/05 1256)  Physical Exam  Constitutional: He is oriented to person, place, and time. He appears well-developed and well-nourished. No distress.  HENT:  Mouth/Throat: Oropharynx is clear and moist. No oropharyngeal exudate.  Cardiovascular: Normal rate, regular rhythm and normal heart sounds. Exam reveals no gallop and no friction rub.  No murmur heard.  Pulmonary/Chest: Effort normal and breath sounds normal. No respiratory distress. He has no wheezes.  Abdominal: Soft. Bowel sounds are normal. He exhibits no distension. There is no tenderness.  Lymphadenopathy:  He has no cervical adenopathy.  Neurological: He is alert and oriented to person, place, and time.  Skin: Skin is warm and dry. No rash noted. No erythema.  Ext: right stump wrapped from surgery no erythema or warmth in upper leg Psychiatric: He has a normal mood and affect. His behavior is normal.     Lab Results  Recent Labs  06/27/13 1120 06/28/13 0420 06/29/13 0500  WBC 12.2* 14.5* 13.7*  HGB 7.8* 7.9* 8.1*  HCT 23.4* 23.3* 24.7*  NA 134* 134*  --   K 3.8 3.8  --   CL 97 96  --   CO2 24 23  --   BUN 38* 45*  --   CREATININE 7.03* 7.97*  --  Microbiology: 5/25 blood cx ngtd Studies/Results: No results found.   Assessment/Plan:  Fever = concern that we do not have source control. Recommend that we repeat blood cx and image thoracic and lumbar spine, possibly previously had transient bacteremia that seeded those areas. He will need anxiolytic to do mri study  MSSA necrotising fasciitis/deep tissue infection of right leg s/p aka  = would give 2 wk of cefazolin as "mop up" to deep tissue infection. Can give with HD.  Dr. Lucianne Lei dam to follow over the weekend    [facility] for Infectious Diseases Cell: [phone] Pager: [phone]  06/29/2013, 4:07 PM

## 2013-06-29 NOTE — Progress Notes (Addendum)
Hemodialysis- Arrived to pick up pt for tx. Pt refusing to go today. States "Im not going 2 days in a row." Discussed need for HD to get back on schedule with pt. Dr. Jonnie Finner notifed of pt refusal.

## 2013-06-29 NOTE — Progress Notes (Signed)
PT Cancellation Note  Patient Details Name: Steven Logan MRN: [id number] DOB: [birth year]   Cancelled Treatment:    Reason Eval/Treat Not Completed: Pain limiting ability to participate;Patient at procedure or test/unavailable.  Attempted to see patient x2.  First attempt patient declined due to fatigue and pain.  Second attempt patient going to radiology.  Will return tomorrow for PT session.   Despina Pole 06/29/2013, 7:12 PM Carita Pian. Sanjuana Kava, [place] Pager [phone]

## 2013-06-29 NOTE — Progress Notes (Signed)
Patient ID: Steven Logan, male   DOB: May 25, 1970, 43 y.o.   MRN: [id number] Patient states that his leg feels much better this morning. Hemoglobin 10 white blood cell count have improved slightly from yesterday. It is definitely concerning the patient still has temperature spikes. Anticipate discharge to skilled nursing with continued IV antibiotics with dialysis for 2 weeks.

## 2013-06-30 ENCOUNTER — Encounter (HOSPITAL_COMMUNITY): Payer: Self-pay | Admitting: Neurological Surgery

## 2013-06-30 LAB — CBC
HCT: 22.9 % — ABNORMAL LOW (ref 39.0–52.0)
HEMOGLOBIN: 7.2 g/dL — AB (ref 13.0–17.0)
MCH: 28.6 pg (ref 26.0–34.0)
MCHC: 31.4 g/dL (ref 30.0–36.0)
MCV: 90.9 fL (ref 78.0–100.0)
Platelets: 359 10*3/uL (ref 150–400)
RBC: 2.52 MIL/uL — ABNORMAL LOW (ref 4.22–5.81)
RDW: 14.8 % (ref 11.5–15.5)
WBC: 11.7 10*3/uL — AB (ref 4.0–10.5)

## 2013-06-30 LAB — RENAL FUNCTION PANEL
ALBUMIN: 1.2 g/dL — AB (ref 3.5–5.2)
Albumin: 1.4 g/dL — ABNORMAL LOW (ref 3.5–5.2)
BUN: 46 mg/dL — AB (ref 6–23)
BUN: 24 mg/dL — ABNORMAL HIGH (ref 6–23)
CALCIUM: 7.6 mg/dL — AB (ref 8.4–10.5)
CO2: 22 mEq/L (ref 19–32)
CO2: 28 mEq/L (ref 19–32)
Calcium: 7.6 mg/dL — ABNORMAL LOW (ref 8.4–10.5)
Chloride: 99 mEq/L (ref 96–112)
Chloride: 96 mEq/L (ref 96–112)
Creatinine, Ser: 6.67 mg/dL — ABNORMAL HIGH (ref 0.50–1.35)
Creatinine, Ser: 3.65 mg/dL — ABNORMAL HIGH (ref 0.50–1.35)
GFR calc Af Amer: 11 mL/min — ABNORMAL LOW (ref >90)
GFR calc Af Amer: 22 mL/min — ABNORMAL LOW (ref >90)
GFR calc non Af Amer: 9 mL/min — ABNORMAL LOW (ref >90)
GFR calc non Af Amer: 19 mL/min — ABNORMAL LOW (ref >90)
GLUCOSE: 103 mg/dL — AB (ref 70–99)
GLUCOSE: 102 mg/dL — AB (ref 70–99)
PHOSPHORUS: 6.9 mg/dL — AB (ref 2.3–4.6)
Phosphorus: 3.5 mg/dL (ref 2.3–4.6)
Potassium: 4.4 mEq/L (ref 3.7–5.3)
Potassium: 4.1 mEq/L (ref 3.7–5.3)
Sodium: 135 mEq/L — ABNORMAL LOW (ref 137–147)
Sodium: 134 mEq/L — ABNORMAL LOW (ref 137–147)

## 2013-06-30 LAB — GLUCOSE, CAPILLARY
GLUCOSE-CAPILLARY: 99 mg/dL (ref 70–99)
Glucose-Capillary: 104 mg/dL — ABNORMAL HIGH (ref 70–99)
Glucose-Capillary: 95 mg/dL (ref 70–99)

## 2013-06-30 LAB — PROTIME-INR
INR: 1.13 (ref 0.00–1.49)
PROTHROMBIN TIME: 14.3 s (ref 11.6–15.2)

## 2013-06-30 LAB — PREPARE RBC (CROSSMATCH)

## 2013-06-30 MED ORDER — HYDROMORPHONE HCL PF 1 MG/ML IJ SOLN
INTRAMUSCULAR | Status: AC
  Filled 2013-06-30: qty 1

## 2013-06-30 MED ORDER — DARBEPOETIN ALFA-POLYSORBATE 150 MCG/0.3ML IJ SOLN
INTRAMUSCULAR | Status: AC
  Filled 2013-06-30: qty 0.3

## 2013-06-30 MED ORDER — WARFARIN SODIUM 7.5 MG PO TABS
7.5000 mg | ORAL_TABLET | Freq: Once | ORAL | Status: AC
  Administered 2013-06-30: 7.5 mg via ORAL
  Filled 2013-06-30 (×2): qty 1

## 2013-06-30 MED ORDER — CALCITRIOL 0.5 MCG PO CAPS
1.0000 ug | ORAL_CAPSULE | Freq: Once | ORAL | Status: AC
  Administered 2013-06-30: 1 ug via ORAL
  Filled 2013-06-30: qty 2

## 2013-06-30 MED ORDER — OXYCODONE-ACETAMINOPHEN 5-325 MG PO TABS
ORAL_TABLET | ORAL | Status: AC
  Administered 2013-06-30: 1
  Filled 2013-06-30: qty 2

## 2013-06-30 NOTE — Progress Notes (Signed)
ANTICOAGULATION & ANTIBIOTIC CONSULT NOTE - Follow Up Consult  Pharmacy Consult for Lovenox + Warfarin & Cefazolin Indication: Newly diagnosed RUE DVT + hx PE & MSSA deep tissue infection  No Known Allergies  Patient Measurements: Height: 6\' 1"  (185.4 cm) Weight: 223 lb 12.3 oz (101.5 kg) IBW/kg (Calculated) : 79.9  Vital Signs: Temp: 98.5 F (36.9 C) (06/06 0657) Temp src: Oral (06/06 0657) BP: 152/82 mmHg (06/06 0830) Pulse Rate: 87 (06/06 0830)  Labs:  Recent Labs  06/27/13 1120 06/28/13 0420 06/29/13 0500 06/30/13 0535  HGB 7.8* 7.9* 8.1* 7.2*  HCT 23.4* 23.3* 24.7* 22.9*  PLT 263 332 346 359  LABPROT  --  15.6* 14.9 14.3  INR  --  1.27 1.20 1.13  CREATININE 7.03* 7.97*  --  6.67*    Estimated Creatinine Clearance: 17.9 ml/min (by C-G formula based on Cr of 6.67).   Assessment: 2 YOM started on lovenox + warfarin for new RUE DVT diagnosed this admit. 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 Sharol Given)  Full-dose lovenox is not recommended in ESRD due to risk of accumulation/bleeding. This was discussed with Dr. [place] Pink and he wishes to proceed with monitoring of levels and s/sx of bleeding. INR today remains SUBtherapeutic (INR 1.13, goal of 2-3). Since Vit K 10 mg given on 6/2 - will increase warfarin dose today.  The patient also continues on Cefazolin for a MSSA deep tissue infection s/p AKA on 6/3. ID plans for 2 more weeks for "mop-up" to deep tissue infection. The patient received HD off-schedule on 6/4 - Ancef dose given. Plans were to get back on regular HD schedule on Friday, but patient refused.  Currently in HD today (6/6).  Goal of Therapy:  INR 2-3 Anti-Xa level 0.6-1 units/ml 4hrs after LMWH dose given Monitor platelets by anticoagulation protocol: Yes Proper antibiotics for infection/cultures adjusted for  renal/hepatic function    Plan:  1. Continue Lovenox 100 mg SQ every 24 hours - will plan to check an anti-Xa level at steady state 2. Warfarin 7.5 mg x 1 dose at 1800 today 3. Cefazolin 2g post HD- will give dose today after HD. 4. Will continue to monitor for any signs/symptoms of bleeding and will follow up with PT/INR in the a.m.  5. Will continue to follow HD schedule/duration for antibiotic dose appropriateness  Uvaldo Rising, BCPS  Clinical Pharmacist Pager [phone]  06/30/2013 8:58 AM

## 2013-06-30 NOTE — Progress Notes (Signed)
[facility]  PROGRESS NOTE  Steven Logan [id number] DOB: Jun 13, 1970 DOA: 06/02/2013  PCP: Tivis Ringer, MD  Brief narrative: 43 year old male with past medical history of gout, HTN, DLD, PE, Pancreatitis, DM who presented to [facility] ED 06/02/2013 with worsening right ankle swelling for 2 weeks prior to this admission. Patient was started on prednisone for gout treatment initially which did not help. His wound opened up and started to have drainage. Patient subsequently re-presented to the ER. An MRI of the ankle revealed gas-containing abscess and cellulitis. Orthopedics took him emergently to the OR for debridement. He underwent right lower extremity transtibial amputation and remained on IV Zosyn. He continued to have fever post  surgery. ID was consulted. Patient was also started on Vancomycin.  He was diagnosed with acute nonocclusive DVT right upper extremity. 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 He had repeated CT LE due to persistent fever which show a large hematoma. Heparin has been on hold. He has received multiple Blood transfusion. 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 Cultures from wound initially grew MSSA. Vanc was discontinued by ID. Zosyn was continued. ID was following and recommended changing to Cefazolin post surgery. He will need this for 2 weeks. However patient had 2 episodes of fever overnight on 6/4 upto 100.9. WBC not much different. Discussed with Dr. Baxter Flattery and she was concerned about possible spread even though patient remains asymptomatic except for pain in his stump. So MRI of T and L spine was obtained. There is a 29mm fluid collection in ventral aspect of L2. Discussed with Dr. Tommy Medal with ID. He will see patient but asked we contact IR. Spoke to  Dr. Laurence Ferrari with IR and this space cannot be reached by IR. Discussed with Dr. Ronnald Ramp with neurosurgery and he will review films but thinks there is no role for surgery unless patient has neurological deficits which he does not. Await final input from Dr. Ronnald Ramp. Discussed with patient.  Fever C diff negative. Chest x ray showed atelectasis. Blood culture 5-25 no growth to date. CT LE showed large, possibly infected, hematoma. Now status post right AKA. Low grade fevers noted. Possibly from same infection as above.  Diarrhea Better after stopping laxatives.   Acute nonocclusive DVT RUE  Jugular, subclavian, axillary, and brachial veins involved. Has had recent removal of HD cath from right IJ 5/10 (which has been in place since Aug 2014). He was started on Heparin and coumadin. These had to be held for surgery and then resumed. Heparin switched to Lovenox.   Anemia in chronic kidney disease / acute postoperative blood loss anemia  This was due to bleeding into right stump. Now s/p Right AKA. Has received multiple blood transfusions (At least 11 units of PRBC including 6/6). Hgb noted to have decreased today. Was transfused 2 units at HD on 6/6. No overt bleeding noted.  Encephalopathy, toxic  Resolved.   End stage renal disease on HD M/W/F  Per Nephrology. Has fistula left UE. Back on usual schedule from Monday.  Hypertension  BP fluctuates. Continue current medications. Monitor for now without additional changes.   DM / Hyperglycemia  A1c 7.0; good glycemic control. Had hypoglycemic episodes and Lantus was discontinued. No further episodes of hypoglycemia. CBG less than  150. No LA insulin for now.  Hyperparathyroidism Continue Calcitrol and renvela   DVT Prophylaxis: On warfarin/lovenox.  Code Status: full code Family Communication: no family at the bedside  Disposition Plan: SNF when ready. Possibly 6/8. Has bed available.   Consultants:   Ortho   Nephro    ID  Procedures:   I&D on 06/03/2013   IRRIGATION AND DEBRIDEMENT EXTREMITY extensive excision of muscle fascia and soft tissue right leg 06/06/13   Transtibial amputation 5/19   I&D 5-31  Right BKA 5/19  Right AKA 6/3    Antibiotics:   Vanc 5/9 >5/17,  5-29-->6/2  Zosyn 5/9 > 6/4  Cefazolin 6/4-->  HPI/Subjective: Patient feels well. Denies any complaints. Pain continues to improve.   Objective: Filed Vitals:   06/30/13 1030 06/30/13 1058 06/30/13 1150 06/30/13 1351  BP: 110/66 108/65 198/76 198/96  Pulse: 90 95 96 91  Temp:  98.9 F (37.2 C) 99 F (37.2 C) 99.3 F (37.4 C)  TempSrc:  Oral Oral   Resp:  20 20 20   Height:      Weight:  98.6 kg (217 lb 6 oz)    SpO2:  100% 100%     Intake/Output Summary (Last 24 hours) at 06/30/13 1401 Last data filed at 06/30/13 1351  Gross per 24 hour  Intake  302.5 ml  Output   3650 ml  Net -3347.5 ml    Exam:   General:  Pt is alert, not in acute distress  Cardiovascular: Regular rate and rhythm, S1/S2 appreciated   Respiratory: Clear to auscultation bilaterally, no wheezing  Abdomen: Soft, non tender, non distended, bowel sounds present  Extremities: right LE stump covered with dressing.   Neuro: Grossly nonfocal  Data Reviewed: Basic Metabolic Panel:  Recent Labs Lab 06/25/13 0800 06/27/13 1120 06/28/13 0420 06/30/13 0535 06/30/13 0820  NA 133* 134* 134* 135* 134*  K 3.9 3.8 3.8 4.4 4.1  CL 95* 97 96 99 96  CO2 23 24 23 22 28   GLUCOSE 116* 119* 122* 103* 102*  BUN 37* 38* 45* 46* 24*  CREATININE 7.85* 7.03* 7.97* 6.67* 3.65*  CALCIUM 7.0* 7.3* 7.1* 7.6* 7.6*  PHOS 7.6*  --   --  6.9* 3.5   Liver Function Tests:  Recent Labs Lab 06/25/13 0800 06/30/13 0535 06/30/13 0820  ALBUMIN 1.2* 1.2* 1.4*   CBC:  Recent Labs Lab 06/27/13 0530 06/27/13 1120 06/28/13 0420 06/29/13 0500 06/30/13 0535  WBC 14.1* 12.2* 14.5* 13.7* 11.7*  HGB 8.3* 7.8* 7.9* 8.1* 7.2*  HCT 24.5* 23.4* 23.3*  24.7* 22.9*  MCV 87.5 87.6 88.3 90.1 90.9  PLT 296 263 332 346 359    CBG:  Recent Labs Lab 06/29/13 0743 06/29/13 1253 06/29/13 1704 06/29/13 2150 06/30/13 1148  GLUCAP 124* 109* 99 102* 104*    Recent Results (from the past 240 hour(s))  CLOSTRIDIUM DIFFICILE BY PCR     Status: None   Collection Time    06/20/13  5:48 PM      Result Value Ref Range Status   C difficile by pcr NEGATIVE  NEGATIVE Final  SURGICAL PCR SCREEN     Status: None   Collection Time    06/27/13  1:31 AM      Result Value Ref Range Status   MRSA, PCR NEGATIVE  NEGATIVE Final   Staphylococcus aureus NEGATIVE  NEGATIVE Final   Comment:            The Xpert SA Assay (FDA  approved for NASAL specimens     in patients over 87 years of age),     is one component of     a comprehensive surveillance     program.  Test performance has     been validated by [employer] for patients greater     than or equal to 14 year old.     It is not intended     to diagnose infection nor to     guide or monitor treatment.     Studies: Mr Thoracic Spine Wo Contrast  06/29/2013   CLINICAL DATA:  Persistent fever. Necrotizing fasciitis. Status post above the knee amputation. The patient is unable to have contrast due to dialysis.  EXAM: MRI THORACIC AND LUMBAR SPINE WITHOUT CONTRAST  TECHNIQUE: Multiplanar and multiecho pulse sequences of the thoracic and lumbar spine were obtained without intravenous contrast.  COMPARISON:  None.  FINDINGS: MR THORACIC SPINE FINDINGS  The study is mildly degraded by patient motion. Words signal is within normal limits. Marrow signal is diffusely depressed, compatible with anemia. The disc levels at T7-8 and above are normal.  T8-9: A left paramedian disc protrusion is present. This contacts and distorts the ventral surface of the cord.  No other significant disc protrusion or stenosis is present. No focal epidural process or infection is evident.  MR LUMBAR SPINE FINDINGS  Normal  signal is present in the conus medullaris which terminates at T12-L1, within normal limits. Marrow signal is diffusely depressed. Grade 1 anterolisthesis is evident at L4-5. A cystic lesion is noted in the right kidney. Limited imaging of the abdomen is otherwise unremarkable. A ventral epidural fluid collection is present at L2. This measures 4 mm in maximal AP diameter. It extends 27 mm cephalo caudad.  Short pedicles and disc bulging at L1-2 results in mild left foraminal narrowing.  L2-3: Mild disc bulging and short pedicles are present without significant stenosis.  L3-4: Mild disc bulging and facet hypertrophy is present. Mild lateral recess and foraminal narrowing is evident bilaterally.  L4-5: A prominent central protrusion is evident. Moderate lateral recess narrowing is worse left than right. Mild foraminal narrowing is worse on the left.  L5-S1: A shallow central disc protrusion is present. Mild lateral recess narrowing is worse on the right.  IMPRESSION: MR THORACIC SPINE IMPRESSION  1. Left paramedian disc protrusion at T8-9 with mild central canal stenosis. Next item no other significant disc protrusion or stenosis. 2. Diffuse loss of normal marrow signal compatible with anemia. 3. No other focal stenosis or a source of infection in the thoracic spine.  MR LUMBAR SPINE IMPRESSION  1. 4 mm ventral epidural fluid collection extending 27 mm posterior to the L2 vertebral body. This could be related to infection. There is no adjacent source. The disc levels are normal. This creates minimal mass effect. 2. Short pedicles and disc bulging in the lumbar spine as described. 3. Prominent central disc protrusion at L4-5 with moderate lateral recess narrowing, left greater than right.   Electronically Signed   By: Lawrence Santiago M.D.   On: 06/29/2013 21:22   Mr Lumbar Spine Wo Contrast  06/29/2013   CLINICAL DATA:  Persistent fever. Necrotizing fasciitis. Status post above the knee amputation. The patient is  unable to have contrast due to dialysis.  EXAM: MRI THORACIC AND LUMBAR SPINE WITHOUT CONTRAST  TECHNIQUE: Multiplanar and multiecho pulse sequences of the thoracic and lumbar spine were obtained without intravenous contrast.  COMPARISON:  None.  FINDINGS: MR THORACIC SPINE FINDINGS  The study is mildly degraded by patient motion. Words signal is within normal limits. Marrow signal is diffusely depressed, compatible with anemia. The disc levels at T7-8 and above are normal.  T8-9: A left paramedian disc protrusion is present. This contacts and distorts the ventral surface of the cord.  No other significant disc protrusion or stenosis is present. No focal epidural process or infection is evident.  MR LUMBAR SPINE FINDINGS  Normal signal is present in the conus medullaris which terminates at T12-L1, within normal limits. Marrow signal is diffusely depressed. Grade 1 anterolisthesis is evident at L4-5. A cystic lesion is noted in the right kidney. Limited imaging of the abdomen is otherwise unremarkable. A ventral epidural fluid collection is present at L2. This measures 4 mm in maximal AP diameter. It extends 27 mm cephalo caudad.  Short pedicles and disc bulging at L1-2 results in mild left foraminal narrowing.  L2-3: Mild disc bulging and short pedicles are present without significant stenosis.  L3-4: Mild disc bulging and facet hypertrophy is present. Mild lateral recess and foraminal narrowing is evident bilaterally.  L4-5: A prominent central protrusion is evident. Moderate lateral recess narrowing is worse left than right. Mild foraminal narrowing is worse on the left.  L5-S1: A shallow central disc protrusion is present. Mild lateral recess narrowing is worse on the right.  IMPRESSION: MR THORACIC SPINE IMPRESSION  1. Left paramedian disc protrusion at T8-9 with mild central canal stenosis. Next item no other significant disc protrusion or stenosis. 2. Diffuse loss of normal marrow signal compatible with  anemia. 3. No other focal stenosis or a source of infection in the thoracic spine.  MR LUMBAR SPINE IMPRESSION  1. 4 mm ventral epidural fluid collection extending 27 mm posterior to the L2 vertebral body. This could be related to infection. There is no adjacent source. The disc levels are normal. This creates minimal mass effect. 2. Short pedicles and disc bulging in the lumbar spine as described. 3. Prominent central disc protrusion at L4-5 with moderate lateral recess narrowing, left greater than right.   Electronically Signed   By: Lawrence Santiago M.D.   On: 06/29/2013 21:22    Scheduled Meds: . [START ON 07/02/2013] calcitRIOL  1 mcg Oral Q M,W,F-HD  .  ceFAZolin (ANCEF) IV  2 g Intravenous Q M,W,F-HD  . darbepoetin (ARANESP) injection - DIALYSIS  150 mcg Intravenous Q Sat-HD  . docusate sodium  100 mg Oral BID  . enoxaparin (LOVENOX) injection  100 mg Subcutaneous Q24H  . feeding supplement (NEPRO CARB STEADY)  237 mL Oral BID BM  . feeding supplement (PRO-STAT SUGAR FREE 64)  30 mL Oral TID WC  . gabapentin  100 mg Oral TID  . insulin aspart  0-20 Units Subcutaneous TID WC  . labetalol  200 mg Oral BID  . multivitamin  1 tablet Oral QHS  . sevelamer carbonate  2,400 mg Oral TID WC  . sodium chloride  10-40 mL Intracatheter Q12H  . warfarin  7.5 mg Oral ONCE-1800  . Warfarin - Pharmacist Dosing Inpatient   Does not apply q1800   Continuous Infusions: . sodium chloride 10 mL/hr at 06/22/13 2025  . sodium chloride 10 mL/hr at 06/30/13 0435     Bonnielee Haff MD  [facility] Pager [phone]  If 7PM-7AM, please contact night-coverage [email or web address] Password [email or web address] 06/22/2013, 8:18 AM   LOS: 20 days

## 2013-06-30 NOTE — Progress Notes (Signed)
Physical Therapy Treatment Patient Details Name: Steven Logan MRN: [id number] DOB: September 19, 1970 Today's Date: 2013/07/21    History of Present Illness      PT Comments    Pt required motivation to participate in PT treatment today, but reported feeling better after working with PT and being able to move around a little bit.  Pt required return to bed after session in order to receive blood.  Spoke with pt regarding trying a sliding board for transfers.  Follow Up Recommendations  SNF     Equipment Recommendations  None recommended by PT    Recommendations for Other Services       Precautions / Restrictions Precautions Precautions: Fall Restrictions RLE Weight Bearing: Non weight bearing    Mobility  Bed Mobility     Rolling: Min assist   Supine to sit: Mod assist;+2 for physical assistance   Sit to sidelying: Mod assist General bed mobility comments: verbal cues for sequencing; increased time to complete  Transfers   Equipment used: Rolling walker (2 wheeled)             General transfer comment: attempted sit to stand, from EOB with RW +2 assist, without success.  Pt unable to clear bottom from bed.  Ambulation/Gait                 Stairs            Wheelchair Mobility    Modified Rankin (Stroke Patients Only)       Balance                                    Cognition Arousal/Alertness: Awake/alert Behavior During Therapy: WFL for tasks assessed/performed Overall Cognitive Status: Within Functional Limits for tasks assessed                      Exercises General Exercises - Lower Extremity Ankle Circles/Pumps: AROM;Left;10 reps Gluteal Sets: AROM;Both;10 reps    General Comments        Pertinent Vitals/Pain     Home Living Family/patient expects to be discharged to:: Skilled nursing facility                    Prior Function            PT Goals (current goals can now be found in the  care plan section) Progress towards PT goals: Progressing toward goals    Frequency  Min 4X/week    PT Plan Current plan remains appropriate    Co-evaluation             End of Session Equipment Utilized During Treatment: Gait belt Activity Tolerance: Patient tolerated treatment well Patient left: in bed;with nursing/sitter in room;with call bell/phone within reach     Time: 1325-1352 PT Time Calculation (min): 27 min  Charges:  $Therapeutic Activity: 23-37 mins                    G Codes:      Philippa Sicks July 21, 2013, 2:50 PM  Lorrin Goodell, PT  Office # [phone] Pager [phone]

## 2013-06-30 NOTE — Procedures (Signed)
I was present at this dialysis session, have reviewed the session itself and made  appropriate changes  Kelly Splinter MD (pgr) [phone]    [phone] 06/30/2013, 11:31 AM

## 2013-06-30 NOTE — Consult Note (Signed)
Reason for Consult:small epidural fluid collection L2 Referring Physician: hospitalist  Steven Logan is an 43 y.o. male.   HPI:  43 yo male with complicated hospital course and abscess/ BKA with persistent fevers. W/u included MRI T/L spine a rec by ID and I was called to review the images.  Past Medical History  Diagnosis Date  . Eczema   . Morbid obesity   . Gout   . HTN (hypertension)   . Dyslipidemia   . Pulmonary embolism     3  in  lungs  at  one time...  . Abscess     great toe  . Pancreatitis   . Arthritis   . Depression   . Diabetes mellitus   . Anemia   . Pneumonia     2-3 years ago  . Shortness of breath     ?? chest  cold he has now.  10/8- no SOB  . Renal hypertension     Hemo started  Sept 2014- MWF  . Chronic kidney disease     esrd    Past Surgical History  Procedure Laterality Date  . None    . Av fistula placement Left 04/07/2012    Procedure: ARTERIOVENOUS (AV) FISTULA CREATION;  Surgeon: Angelia Mould, MD;  Location: [place];  Service: Vascular;  Laterality: Left;  . Ligation of competing branches of arteriovenous fistula Left 09/19/2012    Procedure: LIGATION OF COMPETING BRANCHES OF LEFT ARM ARTERIOVENOUS FISTULA; Vein angioplasty;  Surgeon: Angelia Mould, MD;  Location: [place];  Service: Vascular;  Laterality: Left;  . Insertion of dialysis catheter Right 09/19/2012    Procedure: INSERTION OF DIALYSIS CATHETER;  Surgeon: Angelia Mould, MD;  Location: [place];  Service: Vascular;  Laterality: Right;  . I&d extremity Right 06/02/2013    Procedure: IRRIGATION AND DEBRIDEMENT ARTHROSCOPIC RIGHT ANKLE;  Surgeon: Augustin Schooling, MD;  Location: [place];  Service: Orthopedics;  Laterality: Right;  . I&d extremity Right 06/04/2013    Procedure: IRRIGATION AND DEBRIDEMENT RIGHT FOOT/ANKLE;  Surgeon: Newt Minion, MD;  Location: [place];  Service: Orthopedics;  Laterality: Right;  . I&d extremity Right 06/06/2013    Procedure: IRRIGATION AND  DEBRIDEMENT EXTREMITY;  Surgeon: Newt Minion, MD;  Location: [place];  Service: Orthopedics;  Laterality: Right;  . Amputation Right 06/12/2013    Procedure: Transtibial Amputation;  Surgeon: Newt Minion, MD;  Location: [place];  Service: Orthopedics;  Laterality: Right;  . I&d extremity Right 06/24/2013    Procedure: IRRIGATION AND DEBRIDEMENT and Revsion of TRANSTIBIAL AMPUTATION;  Surgeon: Newt Minion, MD;  Location: [place];  Service: Orthopedics;  Laterality: Right;  . Amputation Right 06/27/2013    Procedure: AMPUTATION ABOVE KNEE;  Surgeon: Newt Minion, MD;  Location: [place];  Service: Orthopedics;  Laterality: Right;  Right Above Knee Amputation    No Known Allergies  History  Substance Use Topics  . Smoking status: Never Smoker   . Smokeless tobacco: Never Used  . Alcohol Use: No     Comment:  24oz beer  per week.  10/8 no drinking    Family History  Problem Relation Age of Onset  . Stroke Mother   . Diabetes Mother   . Hypertension Mother   . Cancer    . Coronary artery disease    . Hypertension Father   . Colon cancer Maternal Grandmother 43  . Hypertension Brother      Review of Systems  Positive ROS: not obtained  All other systems have been reviewed and were otherwise negative with the exception of those mentioned in the HPI and as above.  Objective: Vital signs in last 24 hours: Temp:  [98.3 F (36.8 C)-99.8 F (37.7 C)] 98.3 F (36.8 C) (06/06 1524) Pulse Rate:  [86-96] 88 (06/06 1524) Resp:  [18-20] 18 (06/06 1524) BP: (108-211)/(65-102) 184/102 mmHg (06/06 1524) SpO2:  [97 %-100 %] 99 % (06/06 1524) Weight:  [98.6 kg (217 lb 6 oz)-101.5 kg (223 lb 12.3 oz)] 98.6 kg (217 lb 6 oz) (06/06 1058)                                                         Data Review Lab Results  Component Value Date   WBC 11.7* 06/30/2013   HGB 7.2* 06/30/2013   HCT 22.9* 06/30/2013   MCV 90.9 06/30/2013   PLT 359 06/30/2013   Lab Results  Component Value Date    NA 134* 06/30/2013   K 4.1 06/30/2013   CL 96 06/30/2013   CO2 28 06/30/2013   BUN 24* 06/30/2013   CREATININE 3.65* 06/30/2013   GLUCOSE 102* 06/30/2013   Lab Results  Component Value Date   INR 1.13 06/30/2013    Radiology: Dg Chest 2 View  06/02/2013   CLINICAL DATA:  Chest pain.  EXAM: CHEST  2 VIEW  COMPARISON:  05/29/2013 and 05/04/2012 as well as chest CT 05/29/2013  FINDINGS: The lungs are hypoinflated with mild interval improvement in right base opacification likely improving effusions/atelectasis. Cannot exclude infection in the right base. Cardiomediastinal silhouette and remainder of the exam is unchanged.  IMPRESSION: Improving right base process likely effusion and atelectasis. Cannot completely exclude infection in the right base.   Electronically Signed   By: Marin Olp M.D.   On: 06/02/2013 15:19   Dg Ankle Complete Right  06/02/2013   CLINICAL DATA:  Edema, swelling, open wound lateral right ankle  EXAM: RIGHT ANKLE - COMPLETE 3+ VIEW  COMPARISON:  None.  FINDINGS: There is no acute fracture no subluxation. The bones are osteopenic likely secondary to hypovolemia. Soft tissue swelling is again appreciated. There is no evidence of cortical destruction.  IMPRESSION: No acute osseous abnormalities.  Diffuse soft tissue swelling.   Electronically Signed   By: Margaree Mackintosh M.D.   On: 06/02/2013 16:54   Mr Ankle Right  Wo Contrast  06/02/2013   CLINICAL DATA:  Right lateral ankle wound.  EXAM: MRI OF THE RIGHT ANKLE WITHOUT CONTRAST  TECHNIQUE: Multiplanar, multisequence MR imaging of the ankle was performed. No intravenous contrast was administered.  COMPARISON:  None.  FINDINGS: There is a large fluid collection in the dorsal ankle, which is suspicious for abscess. There appear to be due tiny loculated gas bubbles within this collection. This is heterogeneous and dissects around the posterior aspect of the ankle.  Craniocaudal extent of this collection as 11.4 cm. Transverse measurement is 7.9  cm. AP thickness is difficult to measure because of the crescentic shaped conforming to the posterior aspect of the ankle but measures at least 3 cm in maximal thickness. This likely abscess infiltrates between the Achilles tendon, the posterior medial tendons and lies dorsal to the peroneal tendons. Diffuse subcutaneous edema is present in the distal leg and ankle extending to  the forefoot.  Bone marrow signal is markedly abnormal, with heterogeneous signal in the distal tibia and fibula, probably due to renal osteodystrophy. Osteochondral lesion of the medial talar dome is present with subchondral marrow edema. No fracture is present. Large ankle effusion. Minimal fluid is present in the posterior medial and posterior lateral tendon sheaths.  IMPRESSION: Large heterogeneous fluid collection in the posterior ankle, anterior to the Achilles tendon consistent with abscess. There appear to be tiny loculated gas centrally, suggesting infection with gas-forming organism.  No convincing evidence of osteomyelitis. Large ankle effusion. Septic arthritis cannot be excluded.   Electronically Signed   By: Dereck Ligas M.D.   On: 06/02/2013 22:09     Assessment/Plan: MRI reviewed and I agree with the report. Done without contrast and had motion artifact, so the study isn't perfect, but there is a tiny ventral epidural fluid collection without compression behind L2. No involvement of disk or bone. I can't be sure this is an infectious process, but if it indeed is an infection it needs no surgery and I would recommend continued antibiotic treatment. It is small and causes no compression or neuro deficit, and is in a location that would be hard to evacuate because it is completely ventral to the dura at that level. Could consider repeat imaging in time to rule out growth and/or confirm resolution with treatment. It is hard to imagine that this tiny area is the etiology of continued fevers. Please let me know if I can be  of further assistance.   Eustace Moore 06/30/2013 3:32 PM

## 2013-06-30 NOTE — Progress Notes (Signed)
Subjective:  Seen on dialysis, no complaints, but low-grade fever overnight  Objective: Vital signs in last 24 hours: Temp:  [98.5 F (36.9 C)-99.8 F (37.7 C)] 98.5 F (36.9 C) (06/06 0657) Pulse Rate:  [86-93] 90 (06/06 1030) Resp:  [18-20] 20 (06/06 0657) BP: (110-211)/(66-106) 110/66 mmHg (06/06 1030) SpO2:  [97 %-100 %] 100 % (06/06 0657) Weight:  [101.5 kg (223 lb 12.3 oz)] 101.5 kg (223 lb 12.3 oz) (06/06 0657) Weight change: -1 kg (-2 lb 3.3 oz)  Intake/Output from previous day: 06/05 0701 - 06/06 0700 In: 600 [P.O.:600] Out: 350 [Urine:350] Intake/Output this shift: Total I/O In: 30 [I.V.:30] Out: -   Lab Results:  Recent Labs  06/29/13 0500 06/30/13 0535  WBC 13.7* 11.7*  HGB 8.1* 7.2*  HCT 24.7* 22.9*  PLT 346 359   BMET:  Recent Labs  06/30/13 0535 06/30/13 0820  NA 135* 134*  K 4.4 4.1  CL 99 96  CO2 22 28  GLUCOSE 103* 102*  BUN 46* 24*  CREATININE 6.67* 3.65*  CALCIUM 7.6* 7.6*  ALBUMIN 1.2* 1.4*   No results found for this basename: PTH,  in the last 72 hours Iron Studies: No results found for this basename: IRON, TIBC, TRANSFERRIN, FERRITIN,  in the last 72 hours  Studies/Results: Mr Thoracic Spine Wo Contrast  06/29/2013   CLINICAL DATA:  Persistent fever. Necrotizing fasciitis. Status post above the knee amputation. The patient is unable to have contrast due to dialysis.  EXAM: MRI THORACIC AND LUMBAR SPINE WITHOUT CONTRAST  TECHNIQUE: Multiplanar and multiecho pulse sequences of the thoracic and lumbar spine were obtained without intravenous contrast.  COMPARISON:  None.  FINDINGS: MR THORACIC SPINE FINDINGS  The study is mildly degraded by patient motion. Words signal is within normal limits. Marrow signal is diffusely depressed, compatible with anemia. The disc levels at T7-8 and above are normal.  T8-9: A left paramedian disc protrusion is present. This contacts and distorts the ventral surface of the cord.  No other significant disc  protrusion or stenosis is present. No focal epidural process or infection is evident.  MR LUMBAR SPINE FINDINGS  Normal signal is present in the conus medullaris which terminates at T12-L1, within normal limits. Marrow signal is diffusely depressed. Grade 1 anterolisthesis is evident at L4-5. A cystic lesion is noted in the right kidney. Limited imaging of the abdomen is otherwise unremarkable. A ventral epidural fluid collection is present at L2. This measures 4 mm in maximal AP diameter. It extends 27 mm cephalo caudad.  Short pedicles and disc bulging at L1-2 results in mild left foraminal narrowing.  L2-3: Mild disc bulging and short pedicles are present without significant stenosis.  L3-4: Mild disc bulging and facet hypertrophy is present. Mild lateral recess and foraminal narrowing is evident bilaterally.  L4-5: A prominent central protrusion is evident. Moderate lateral recess narrowing is worse left than right. Mild foraminal narrowing is worse on the left.  L5-S1: A shallow central disc protrusion is present. Mild lateral recess narrowing is worse on the right.  IMPRESSION: MR THORACIC SPINE IMPRESSION  1. Left paramedian disc protrusion at T8-9 with mild central canal stenosis. Next item no other significant disc protrusion or stenosis. 2. Diffuse loss of normal marrow signal compatible with anemia. 3. No other focal stenosis or a source of infection in the thoracic spine.  MR LUMBAR SPINE IMPRESSION  1. 4 mm ventral epidural fluid collection extending 27 mm posterior to the L2 vertebral body. This could be  related to infection. There is no adjacent source. The disc levels are normal. This creates minimal mass effect. 2. Short pedicles and disc bulging in the lumbar spine as described. 3. Prominent central disc protrusion at L4-5 with moderate lateral recess narrowing, left greater than right.   Electronically Signed   By: Lawrence Santiago M.D.   On: 06/29/2013 21:22   Mr Lumbar Spine Wo  Contrast  06/29/2013   CLINICAL DATA:  Persistent fever. Necrotizing fasciitis. Status post above the knee amputation. The patient is unable to have contrast due to dialysis.  EXAM: MRI THORACIC AND LUMBAR SPINE WITHOUT CONTRAST  TECHNIQUE: Multiplanar and multiecho pulse sequences of the thoracic and lumbar spine were obtained without intravenous contrast.  COMPARISON:  None.  FINDINGS: MR THORACIC SPINE FINDINGS  The study is mildly degraded by patient motion. Words signal is within normal limits. Marrow signal is diffusely depressed, compatible with anemia. The disc levels at T7-8 and above are normal.  T8-9: A left paramedian disc protrusion is present. This contacts and distorts the ventral surface of the cord.  No other significant disc protrusion or stenosis is present. No focal epidural process or infection is evident.  MR LUMBAR SPINE FINDINGS  Normal signal is present in the conus medullaris which terminates at T12-L1, within normal limits. Marrow signal is diffusely depressed. Grade 1 anterolisthesis is evident at L4-5. A cystic lesion is noted in the right kidney. Limited imaging of the abdomen is otherwise unremarkable. A ventral epidural fluid collection is present at L2. This measures 4 mm in maximal AP diameter. It extends 27 mm cephalo caudad.  Short pedicles and disc bulging at L1-2 results in mild left foraminal narrowing.  L2-3: Mild disc bulging and short pedicles are present without significant stenosis.  L3-4: Mild disc bulging and facet hypertrophy is present. Mild lateral recess and foraminal narrowing is evident bilaterally.  L4-5: A prominent central protrusion is evident. Moderate lateral recess narrowing is worse left than right. Mild foraminal narrowing is worse on the left.  L5-S1: A shallow central disc protrusion is present. Mild lateral recess narrowing is worse on the right.  IMPRESSION: MR THORACIC SPINE IMPRESSION  1. Left paramedian disc protrusion at T8-9 with mild central  canal stenosis. Next item no other significant disc protrusion or stenosis. 2. Diffuse loss of normal marrow signal compatible with anemia. 3. No other focal stenosis or a source of infection in the thoracic spine.  MR LUMBAR SPINE IMPRESSION  1. 4 mm ventral epidural fluid collection extending 27 mm posterior to the L2 vertebral body. This could be related to infection. There is no adjacent source. The disc levels are normal. This creates minimal mass effect. 2. Short pedicles and disc bulging in the lumbar spine as described. 3. Prominent central disc protrusion at L4-5 with moderate lateral recess narrowing, left greater than right.   Electronically Signed   By: Lawrence Santiago M.D.   On: 06/29/2013 21:22    EXAM:  General appearance: Alert, in no apparent distress  Resp: CTA without rales, rhonchi, or wheezes  Cardio: RRR without murmur or rub  GI: + BS, soft and nontender  Extremities: R AKA wrapped, L LE with no edema  Access: AVF @ LFA with + bruit   Dialysis Orders: HD: [facility]  4.5h 111.5kg 2/2.5 Bath 500/800 Heparin-- none for now due to anemia/ hematoma (was on 11000) L AVF use 14 needles Calcitriol 1.25ug EPO 8000 Venofer s/p load completed 5/11   Assessment/Plan: 1. Necrotizing  fasciitis R leg - s/p BKA 5/19, AKA 6/3 per Dr. Sharol Given; Vancomycin dc'd, remains on Ancef.  2. Fever - uncertain etiology, MRI showed epidural fluid collection posterior to L2, possibly infection; remains on Ancef, followed by ID. 3. RUE DVT - anticoagulation restarted per pharmacy.  4. ESRD - HD on MWF @ [place], K 4.1. HD today. 5. HTN/Volume - BP 110/66 on Labetalol 200 mg bid; wt 101.5 kg with UF goal 4 L. Lower EDW. 6. Anemia - Hgb down to 7.2 after surgery, s/p 3 U PRBCs (most recently 5/31), Aranesp 100 mcg, increased to 150 mcg today, Fe per HD.  Transfuse 2 U PRBCs. 7. Sec HPT - Ca 7.6 (9.7 corrected), P 3.5; Calcitriol 1 mcg, Renvela 3 with meals.  8. Nutrition - Alb 1.4, appetite improving, regular  diet with Nepro & Prostat, multivitamin.     LOS: 28 days   Ramiro Harvest 06/30/2013,10:39 AM  Pt seen, examined and agree w A/P as above.  Kelly Splinter MD pager [phone]    cell [phone] 06/30/2013, 11:32 AM

## 2013-06-30 NOTE — Progress Notes (Signed)
Blood pressure=211/97, patient due for dialysis this morning. Spoke with dialysis nurse who confirmed patient is on the first shift. No prn given per protocol.

## 2013-07-01 DIAGNOSIS — M726 Necrotizing fasciitis: Secondary | ICD-10-CM

## 2013-07-01 LAB — TYPE AND SCREEN
ABO/RH(D): A POS
ANTIBODY SCREEN: NEGATIVE
Unit division: 0
Unit division: 0

## 2013-07-01 LAB — GLUCOSE, CAPILLARY
GLUCOSE-CAPILLARY: 91 mg/dL (ref 70–99)
GLUCOSE-CAPILLARY: 114 mg/dL — AB (ref 70–99)
GLUCOSE-CAPILLARY: 104 mg/dL — AB (ref 70–99)
Glucose-Capillary: 109 mg/dL — ABNORMAL HIGH (ref 70–99)

## 2013-07-01 LAB — PROTIME-INR
INR: 1.41 (ref 0.00–1.49)
PROTHROMBIN TIME: 16.9 s — AB (ref 11.6–15.2)

## 2013-07-01 LAB — CBC
HCT: 28.1 % — ABNORMAL LOW (ref 39.0–52.0)
Hemoglobin: 9 g/dL — ABNORMAL LOW (ref 13.0–17.0)
MCH: 29.1 pg (ref 26.0–34.0)
MCHC: 32 g/dL (ref 30.0–36.0)
MCV: 90.9 fL (ref 78.0–100.0)
PLATELETS: 395 10*3/uL (ref 150–400)
RBC: 3.09 MIL/uL — AB (ref 4.22–5.81)
RDW: 14.7 % (ref 11.5–15.5)
WBC: 11.3 10*3/uL — AB (ref 4.0–10.5)

## 2013-07-01 MED ORDER — WARFARIN SODIUM 7.5 MG PO TABS
7.5000 mg | ORAL_TABLET | Freq: Once | ORAL | Status: AC
  Administered 2013-07-01: 7.5 mg via ORAL
  Filled 2013-07-01: qty 1

## 2013-07-01 MED ORDER — CEFAZOLIN SODIUM-DEXTROSE 2-3 GM-% IV SOLR: 2.0000 g | INTRAVENOUS | Status: DC

## 2013-07-01 NOTE — Progress Notes (Signed)
Subjective:  No complaints, but again with low-grade fever overnight.  Objective: Vital signs in last 24 hours: Temp:  [97.9 F (36.6 C)-99.4 F (37.4 C)] 97.9 F (36.6 C) (06/07 0534) Pulse Rate:  [84-96] 87 (06/07 0534) Resp:  [17-20] 17 (06/07 0534) BP: (108-198)/(65-106) 153/83 mmHg (06/07 0534) SpO2:  [96 %-100 %] 99 % (06/07 0534) Weight:  [98.6 kg (217 lb 6 oz)-99.3 kg (218 lb 14.7 oz)] 99.3 kg (218 lb 14.7 oz) (06/06 2114) Weight change: -2.9 kg (-6 lb 6.3 oz)  Intake/Output from previous day: 06/06 0701 - 06/07 0700 In: 830 [P.O.:480; I.V.:50; Blood:300] Out: 3300  Intake/Output this shift:   Lab Results:  Recent Labs  06/30/13 0535 07/01/13 0420  WBC 11.7* 11.3*  HGB 7.2* 9.0*  HCT 22.9* 28.1*  PLT 359 395   BMET:  Recent Labs  06/30/13 0535 06/30/13 0820  NA 135* 134*  K 4.4 4.1  CL 99 96  CO2 22 28  GLUCOSE 103* 102*  BUN 46* 24*  CREATININE 6.67* 3.65*  CALCIUM 7.6* 7.6*  ALBUMIN 1.2* 1.4*   No results found for this basename: PTH,  in the last 72 hours Iron Studies: No results found for this basename: IRON, TIBC, TRANSFERRIN, FERRITIN,  in the last 72 hours  Studies/Results: No results found.  EXAM:  General appearance: Alert, in no apparent distress  Resp: CTA without rales, rhonchi, or wheezes  Cardio: RRR without murmur or rub  GI: + BS, soft and nontender  Extremities: R AKA wrapped, L LE with no edema  Access: AVF @ LFA with + bruit   Dialysis Orders: HD: [facility]  4.5h 111.5kg 2/2.5 Bath 500/800 Heparin-- none for now due to anemia/ hematoma (was on 11000) L AVF use 14 needles Calcitriol 1.25ug EPO 8000 Venofer s/p load completed 5/11   Assessment/Plan: 1. Necrotizing fasciitis R leg - s/p BKA 5/19, AKA 6/3 per Dr. Sharol Given; Vancomycin dc'd, remains on Ancef.  2. Fever - uncertain etiology, MRI showed epidural fluid collection posterior to L2, possibly infection, reviewed by Neurosurgery, unlikely fever etiology, recommends antibiotic  Tx; remains on Ancef, followed by ID.  3. RUE DVT - anticoagulation restarted per pharmacy.  4. ESRD - HD on MWF @ [place], K 4.1.  HD tomorrow to resume schedule.  5. HTN/Volume - BP 153/83 on Labetalol 200 mg bid; wt 99.3 kg s/p net UF 3.3 L yesterday. Lower EDW.  6. Anemia - 2 U PRBCs yesterday for Hgb down to 7.2, now 9; received 3 U previously, Aranesp 100 mcg, increased to 150 mcg yesterday, Fe per HD.  7. Sec HPT - Ca 7.6 (9.7 corrected), P 3.5; Calcitriol 1 mcg, Renvela 3 with meals.  8. Nutrition - Alb 1.4, appetite improving, regular diet with Nepro & Prostat, multivitamin     LOS: 29 days   Ramiro Harvest 07/01/2013,8:02 AM  Pt seen, examined and agree w A/P as above. Fevers better. For d/c to SNF tomorrow after HD.  Kelly Splinter MD pager [phone]    cell [phone] 07/01/2013, 1:46 PM

## 2013-07-01 NOTE — Progress Notes (Signed)
[facility]  PROGRESS NOTE  Steven Logan [id number] DOB: [birth year] DOA: 06/02/2013  PCP: Tivis Ringer, MD  Brief narrative: 43 year old male with past medical history of gout, HTN, DLD, PE, Pancreatitis, DM who presented to [facility] ED 06/02/2013 with worsening right ankle swelling for 2 weeks prior to this admission. Patient was started on prednisone for gout treatment initially which did not help. His wound opened up and started to have drainage. Patient subsequently re-presented to the ER. An MRI of the ankle revealed gas-containing abscess and cellulitis. Orthopedics took him emergently to the OR for debridement. He underwent right lower extremity transtibial amputation and remained on IV Zosyn. He continued to have fever post  surgery. ID was consulted. Patient was also started on Vancomycin.  He was diagnosed with acute nonocclusive DVT right upper extremity. 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 He had repeated CT LE due to persistent fever which show a large hematoma. Heparin has been on hold. He has received multiple Blood transfusion. 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 Cultures from wound initially grew MSSA. Vanc was discontinued by ID. Zosyn was continued. ID was following and recommended changing to Cefazolin post surgery. However patient had 2 episodes of fever overnight on 6/4 upto 100.9. WBC not much different. Discussed with Dr. Baxter Flattery and she was concerned about possible spread even though patient remains asymptomatic except for pain in his stump. So MRI of T and L spine was obtained. There is a 79mm fluid collection in ventral aspect of L2. This could be infection. But IR cannot reach this area. Appreciate Neurosurgeon, Dr. Ronnald Ramp, input as well. Since he does  not have neurological deficits not a candidate for surgery. Per ID he will now need prolonged antibiotics, 6 weeks, instead of 2 weeks.   Fever Seems to be resolving. C diff negative. Chest x ray showed atelectasis. Blood culture 5-25 no growth to date. CT LE showed large, possibly infected, hematoma. Now status post right AKA. Low grade fevers noted. Possibly from same infection as above.  Diarrhea Better after stopping laxatives.   Acute nonocclusive DVT RUE  Jugular, subclavian, axillary, and brachial veins involved. Has had recent removal of HD cath from right IJ 5/10 (which has been in place since Aug 2014). He was started on Heparin and coumadin. These had to be held for surgery and then resumed. Heparin switched to Lovenox.   Anemia in chronic kidney disease / acute postoperative blood loss anemia  This was due to bleeding into right stump. Now s/p Right AKA. Has received multiple blood transfusions (At least 11 units of PRBC including 6/6). Hgb noted to have decreased today. Was transfused 2 units at HD on 6/6. No overt bleeding noted.  Encephalopathy, toxic  Resolved.   End stage renal disease on HD M/W/F  Per Nephrology. Has fistula left UE. Back on usual schedule from Monday.  Hypertension  BP fluctuates. Continue current medications. Monitor for now without additional changes.   DM / Hyperglycemia  A1c 7.0; good glycemic control. Had hypoglycemic episodes and Lantus was discontinued. No further episodes of hypoglycemia. CBG less than 150. No LA insulin for now.  Hyperparathyroidism Continue Calcitrol and renvela   DVT Prophylaxis: On warfarin/lovenox.  Code Status: full code Family Communication: no  family at the bedside  Disposition Plan: SNF on 6/8 after HD.    Consultants:   Ortho   Nephro   ID  Procedures:   I&D on 06/03/2013   IRRIGATION AND DEBRIDEMENT EXTREMITY extensive excision of muscle fascia and soft tissue right leg 06/06/13   Transtibial  amputation 5/19   I&D 5-31  Right BKA 5/19  Right AKA 6/3    Antibiotics:   Vanc 5/9 >5/17,  5-29-->6/2  Zosyn 5/9 > 6/4  Cefazolin 6/4-->  HPI/Subjective: Patient feels well. Pain continues to improve.   Objective: Filed Vitals:   06/30/13 1730 06/30/13 2114 07/01/13 0534 07/01/13 1019  BP: 189/86 189/99 153/83 176/89  Pulse: 89 96 87 88  Temp: 99.1 F (37.3 C) 99.4 F (37.4 C) 97.9 F (36.6 C) 98.1 F (36.7 C)  TempSrc: Oral Oral Oral Oral  Resp: 18 18 17 18   Height:  6\' 1"  (1.854 m)    Weight:  99.3 kg (218 lb 14.7 oz)    SpO2: 99% 96% 99% 96%    Intake/Output Summary (Last 24 hours) at 07/01/13 1218 Last data filed at 06/30/13 1821  Gross per 24 hour  Intake    780 ml  Output      0 ml  Net    780 ml    Exam:   General:  Pt is alert, not in acute distress  Cardiovascular: Regular rate and rhythm, S1/S2 appreciated   Respiratory: Clear to auscultation bilaterally, no wheezing  Abdomen: Soft, non tender, non distended, bowel sounds present  Extremities: right LE stump covered with dressing. Able to lift LLE.   Neuro: Grossly nonfocal.   Data Reviewed: Basic Metabolic Panel:  Recent Labs Lab 06/25/13 0800 06/27/13 1120 06/28/13 0420 06/30/13 0535 06/30/13 0820  NA 133* 134* 134* 135* 134*  K 3.9 3.8 3.8 4.4 4.1  CL 95* 97 96 99 96  CO2 23 24 23 22 28   GLUCOSE 116* 119* 122* 103* 102*  BUN 37* 38* 45* 46* 24*  CREATININE 7.85* 7.03* 7.97* 6.67* 3.65*  CALCIUM 7.0* 7.3* 7.1* 7.6* 7.6*  PHOS 7.6*  --   --  6.9* 3.5   Liver Function Tests:  Recent Labs Lab 06/25/13 0800 06/30/13 0535 06/30/13 0820  ALBUMIN 1.2* 1.2* 1.4*   CBC:  Recent Labs Lab 06/27/13 1120 06/28/13 0420 06/29/13 0500 06/30/13 0535 07/01/13 0420  WBC 12.2* 14.5* 13.7* 11.7* 11.3*  HGB 7.8* 7.9* 8.1* 7.2* 9.0*  HCT 23.4* 23.3* 24.7* 22.9* 28.1*  MCV 87.6 88.3 90.1 90.9 90.9  PLT 263 332 346 359 395    CBG:  Recent Labs Lab 06/29/13 2150  06/30/13 1148 06/30/13 1654 06/30/13 2136 07/01/13 0819  GLUCAP 102* 104* 99 95 91    Recent Results (from the past 240 hour(s))  SURGICAL PCR SCREEN     Status: None   Collection Time    06/27/13  1:31 AM      Result Value Ref Range Status   MRSA, PCR NEGATIVE  NEGATIVE Final   Staphylococcus aureus NEGATIVE  NEGATIVE Final   Comment:            The Xpert SA Assay (FDA     approved for NASAL specimens     in patients over 20 years of age),     is one component of     a comprehensive surveillance     program.  Test performance has     been validated by [employer]  for patients greater     than or equal to 70 year old.     It is not intended     to diagnose infection nor to     guide or monitor treatment.     Studies: Mr Thoracic Spine Wo Contrast  06/29/2013   CLINICAL DATA:  Persistent fever. Necrotizing fasciitis. Status post above the knee amputation. The patient is unable to have contrast due to dialysis.  EXAM: MRI THORACIC AND LUMBAR SPINE WITHOUT CONTRAST  TECHNIQUE: Multiplanar and multiecho pulse sequences of the thoracic and lumbar spine were obtained without intravenous contrast.  COMPARISON:  None.  FINDINGS: MR THORACIC SPINE FINDINGS  The study is mildly degraded by patient motion. Words signal is within normal limits. Marrow signal is diffusely depressed, compatible with anemia. The disc levels at T7-8 and above are normal.  T8-9: A left paramedian disc protrusion is present. This contacts and distorts the ventral surface of the cord.  No other significant disc protrusion or stenosis is present. No focal epidural process or infection is evident.  MR LUMBAR SPINE FINDINGS  Normal signal is present in the conus medullaris which terminates at T12-L1, within normal limits. Marrow signal is diffusely depressed. Grade 1 anterolisthesis is evident at L4-5. A cystic lesion is noted in the right kidney. Limited imaging of the abdomen is otherwise unremarkable. A ventral  epidural fluid collection is present at L2. This measures 4 mm in maximal AP diameter. It extends 27 mm cephalo caudad.  Short pedicles and disc bulging at L1-2 results in mild left foraminal narrowing.  L2-3: Mild disc bulging and short pedicles are present without significant stenosis.  L3-4: Mild disc bulging and facet hypertrophy is present. Mild lateral recess and foraminal narrowing is evident bilaterally.  L4-5: A prominent central protrusion is evident. Moderate lateral recess narrowing is worse left than right. Mild foraminal narrowing is worse on the left.  L5-S1: A shallow central disc protrusion is present. Mild lateral recess narrowing is worse on the right.  IMPRESSION: MR THORACIC SPINE IMPRESSION  1. Left paramedian disc protrusion at T8-9 with mild central canal stenosis. Next item no other significant disc protrusion or stenosis. 2. Diffuse loss of normal marrow signal compatible with anemia. 3. No other focal stenosis or a source of infection in the thoracic spine.  MR LUMBAR SPINE IMPRESSION  1. 4 mm ventral epidural fluid collection extending 27 mm posterior to the L2 vertebral body. This could be related to infection. There is no adjacent source. The disc levels are normal. This creates minimal mass effect. 2. Short pedicles and disc bulging in the lumbar spine as described. 3. Prominent central disc protrusion at L4-5 with moderate lateral recess narrowing, left greater than right.   Electronically Signed   By: Lawrence Santiago M.D.   On: 06/29/2013 21:22   Mr Lumbar Spine Wo Contrast  06/29/2013   CLINICAL DATA:  Persistent fever. Necrotizing fasciitis. Status post above the knee amputation. The patient is unable to have contrast due to dialysis.  EXAM: MRI THORACIC AND LUMBAR SPINE WITHOUT CONTRAST  TECHNIQUE: Multiplanar and multiecho pulse sequences of the thoracic and lumbar spine were obtained without intravenous contrast.  COMPARISON:  None.  FINDINGS: MR THORACIC SPINE FINDINGS  The  study is mildly degraded by patient motion. Words signal is within normal limits. Marrow signal is diffusely depressed, compatible with anemia. The disc levels at T7-8 and above are normal.  T8-9: A left paramedian disc protrusion is present. This contacts and  distorts the ventral surface of the cord.  No other significant disc protrusion or stenosis is present. No focal epidural process or infection is evident.  MR LUMBAR SPINE FINDINGS  Normal signal is present in the conus medullaris which terminates at T12-L1, within normal limits. Marrow signal is diffusely depressed. Grade 1 anterolisthesis is evident at L4-5. A cystic lesion is noted in the right kidney. Limited imaging of the abdomen is otherwise unremarkable. A ventral epidural fluid collection is present at L2. This measures 4 mm in maximal AP diameter. It extends 27 mm cephalo caudad.  Short pedicles and disc bulging at L1-2 results in mild left foraminal narrowing.  L2-3: Mild disc bulging and short pedicles are present without significant stenosis.  L3-4: Mild disc bulging and facet hypertrophy is present. Mild lateral recess and foraminal narrowing is evident bilaterally.  L4-5: A prominent central protrusion is evident. Moderate lateral recess narrowing is worse left than right. Mild foraminal narrowing is worse on the left.  L5-S1: A shallow central disc protrusion is present. Mild lateral recess narrowing is worse on the right.  IMPRESSION: MR THORACIC SPINE IMPRESSION  1. Left paramedian disc protrusion at T8-9 with mild central canal stenosis. Next item no other significant disc protrusion or stenosis. 2. Diffuse loss of normal marrow signal compatible with anemia. 3. No other focal stenosis or a source of infection in the thoracic spine.  MR LUMBAR SPINE IMPRESSION  1. 4 mm ventral epidural fluid collection extending 27 mm posterior to the L2 vertebral body. This could be related to infection. There is no adjacent source. The disc levels are  normal. This creates minimal mass effect. 2. Short pedicles and disc bulging in the lumbar spine as described. 3. Prominent central disc protrusion at L4-5 with moderate lateral recess narrowing, left greater than right.   Electronically Signed   By: Lawrence Santiago M.D.   On: 06/29/2013 21:22    Scheduled Meds: . [START ON 07/02/2013] calcitRIOL  1 mcg Oral Q M,W,F-HD  .  ceFAZolin (ANCEF) IV  2 g Intravenous Q M,W,F-HD  . darbepoetin (ARANESP) injection - DIALYSIS  150 mcg Intravenous Q Sat-HD  . docusate sodium  100 mg Oral BID  . enoxaparin (LOVENOX) injection  100 mg Subcutaneous Q24H  . feeding supplement (NEPRO CARB STEADY)  237 mL Oral BID BM  . feeding supplement (PRO-STAT SUGAR FREE 64)  30 mL Oral TID WC  . gabapentin  100 mg Oral TID  . insulin aspart  0-20 Units Subcutaneous TID WC  . labetalol  200 mg Oral BID  . multivitamin  1 tablet Oral QHS  . sevelamer carbonate  2,400 mg Oral TID WC  . sodium chloride  10-40 mL Intracatheter Q12H  . warfarin  7.5 mg Oral ONCE-1800  . Warfarin - Pharmacist Dosing Inpatient   Does not apply q1800   Continuous Infusions: . sodium chloride 10 mL/hr at 06/22/13 2025  . sodium chloride 10 mL/hr at 06/30/13 0435     Bonnielee Haff MD  [facility] Pager [phone]  If 7PM-7AM, please contact night-coverage [email or web address] Password [email or web address] 06/22/2013, 8:18 AM   LOS: 20 days

## 2013-07-01 NOTE — Progress Notes (Signed)
ANTICOAGULATION CONSULT NOTE - Follow Up Consult  Pharmacy Consult for Warfarin + Lovenox Indication: newly diagnosed RUE DVT + hx PE  No Known Allergies  Patient Measurements: Height: 6\' 1"  (185.4 cm) Weight: 218 lb 14.7 oz (99.3 kg) IBW/kg (Calculated) : 79.9 Heparin Dosing Weight: n/a  Vital Signs: Temp: 97.9 F (36.6 C) (06/07 0534) Temp src: Oral (06/07 0534) BP: 153/83 mmHg (06/07 0534) Pulse Rate: 87 (06/07 0534)  Labs:  Recent Labs  06/29/13 0500 06/30/13 0535 06/30/13 0820 07/01/13 0420  HGB 8.1* 7.2*  --  9.0*  HCT 24.7* 22.9*  --  28.1*  PLT 346 359  --  395  LABPROT 14.9 14.3  --  16.9*  INR 1.20 1.13  --  1.41  CREATININE  --  6.67* 3.65*  --     Estimated Creatinine Clearance: 32.4 ml/min (by C-G formula based on Cr of 3.65).   Medications:  Scheduled:  . [START ON 07/02/2013] calcitRIOL  1 mcg Oral Q M,W,F-HD  .  ceFAZolin (ANCEF) IV  2 g Intravenous Q M,W,F-HD  . darbepoetin (ARANESP) injection - DIALYSIS  150 mcg Intravenous Q Sat-HD  . docusate sodium  100 mg Oral BID  . enoxaparin (LOVENOX) injection  100 mg Subcutaneous Q24H  . feeding supplement (NEPRO CARB STEADY)  237 mL Oral BID BM  . feeding supplement (PRO-STAT SUGAR FREE 64)  30 mL Oral TID WC  . gabapentin  100 mg Oral TID  . insulin aspart  0-20 Units Subcutaneous TID WC  . labetalol  200 mg Oral BID  . multivitamin  1 tablet Oral QHS  . sevelamer carbonate  2,400 mg Oral TID WC  . sodium chloride  10-40 mL Intracatheter Q12H  . Warfarin - Pharmacist Dosing Inpatient   Does not apply q1800    Assessment: 57 YOM started on lovenox + warfarin for new RUE DVT diagnosed this admit. 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 Sharol Given)   Full-dose lovenox is not recommended in ESRD due to risk of accumulation/bleeding. This was discussed with Dr. [place] Pink  and he wishes to proceed with monitoring of levels and s/sx of bleeding. INR today remains SUBtherapeutic (INR 1.41, goal of 2-3). Since Vit K 10 mg given on 6/2 - has been requiring larger doses of Coumadin, but suspect effect should start to be less soon.  Hgb increased (9) s/p PRBCs.  No active bleeding noted, Pltc stable.  Goal of Therapy:  INR 2-3 Anti-Xa level 0.6-1 units/ml 4hrs after LMWH dose given Monitor platelets by anticoagulation protocol: Yes   Plan:  1. Continue Lovenox 100 mg sq q 24 hrs - check anti-Xa tomorrow. 2. Warfarin 7.5 mg x 1. 3. Daily INR, CBC q 72 hrs. 4. Continue to monitor for bleeding.  Uvaldo Rising, BCPS  Clinical Pharmacist Pager [phone]  07/01/2013 8:25 AM

## 2013-07-01 NOTE — Progress Notes (Signed)
[facility]    Subjective: No new complaints   Antibiotics:  Anti-infectives   Start     Dose/Rate Route Frequency Ordered Stop   07/02/13 1200  ceFAZolin (ANCEF) IVPB 2 g/50 mL premix  Status:  Discontinued     2 g 100 mL/hr over 30 Minutes Intravenous Every M-W-F (Hemodialysis) 06/29/13 0815 06/29/13 1014   07/01/13 0000  ceFAZolin (ANCEF) 2-3 GM-% SOLR     2 g 100 mL/hr over 30 Minutes Intravenous Every M-W-F (Hemodialysis) 07/01/13 1618     06/30/13 1200  ceFAZolin (ANCEF) IVPB 2 g/50 mL premix  Status:  Discontinued     2 g 100 mL/hr over 30 Minutes Intravenous Every Sat (Hemodialysis) 06/29/13 0815 06/29/13 1013   06/30/13 1200  ceFAZolin (ANCEF) IVPB 2 g/50 mL premix     2 g 100 mL/hr over 30 Minutes Intravenous Every M-W-F (Hemodialysis) 06/29/13 1549     06/29/13 1200  ceFAZolin (ANCEF) IVPB 2 g/50 mL premix  Status:  Discontinued     2 g 100 mL/hr over 30 Minutes Intravenous Every M-W-F (Hemodialysis) 06/29/13 1014 06/29/13 1549   06/29/13 0000  ceFAZolin (ANCEF) 2-3 GM-% SOLR  Status:  Discontinued     2 g 100 mL/hr over 30 Minutes Intravenous Every M-W-F (Hemodialysis) 06/29/13 1246 07/01/13    06/28/13 1000  ceFAZolin (ANCEF) IVPB 2 g/50 mL premix     2 g 100 mL/hr over 30 Minutes Intravenous To Hemodialysis 06/28/13 0954 06/28/13 1244   06/25/13 1200  vancomycin (VANCOCIN) IVPB 1000 mg/200 mL premix  Status:  Discontinued     1,000 mg 200 mL/hr over 60 Minutes Intravenous Every M-W-F (Hemodialysis) 06/22/13 1847 06/25/13 1048   06/22/13 2030  vancomycin (VANCOCIN) 2,000 mg in sodium chloride 0.9 % 500 mL IVPB     2,000 mg 250 mL/hr over 120 Minutes Intravenous  Once 06/22/13 1847 06/22/13 2328   06/22/13 2000  piperacillin-tazobactam (ZOSYN) IVPB 2.25 g  Status:  Discontinued     2.25 g 100 mL/hr over 30 Minutes Intravenous 3 times per day 06/22/13 1847 06/28/13 0817   06/22/13 1200  ceFAZolin (ANCEF) 2 g in dextrose 5 % 50 mL IVPB   Status:  Discontinued     2 g 100 mL/hr over 30 Minutes Intravenous Every M-W-F (Hemodialysis) 06/22/13 0900 06/22/13 1836   06/05/13 0600  ceFAZolin (ANCEF) IVPB 2 g/50 mL premix  Status:  Discontinued     2 g 100 mL/hr over 30 Minutes Intravenous On call to O.R. 06/04/13 1301 06/04/13 1858   06/05/13 0000  vancomycin (VANCOCIN) 1,500 mg in sodium chloride 0.9 % 500 mL IVPB  Status:  Discontinued     1,500 mg 250 mL/hr over 120 Minutes Intravenous Every 48 hours 06/03/13 0239 06/03/13 0243   06/04/13 1200  vancomycin (VANCOCIN) IVPB 1000 mg/200 mL premix  Status:  Discontinued     1,000 mg 200 mL/hr over 60 Minutes Intravenous Every M-W-F (Hemodialysis) 06/03/13 0244 06/10/13 1415   06/03/13 0600  piperacillin-tazobactam (ZOSYN) IVPB 3.375 g  Status:  Discontinued     3.375 g 12.5 mL/hr over 240 Minutes Intravenous Every 8 hours 06/03/13 0239 06/03/13 0241   06/03/13 0600  piperacillin-tazobactam (ZOSYN) IVPB 2.25 g  Status:  Discontinued     2.25 g 100 mL/hr over 30 Minutes Intravenous Every 8 hours 06/03/13 0243 06/22/13 0813   06/03/13 0400  vancomycin (VANCOCIN) IVPB 1000 mg/200 mL premix     1,000 mg 200 mL/hr  over 60 Minutes Intravenous  Once 06/03/13 0239 06/03/13 0551   06/02/13 2245  [MAR Hold]  vancomycin (VANCOCIN) 2,000 mg in sodium chloride 0.9 % 500 mL IVPB  Status:  Discontinued     (On MAR Hold since 06/02/13 2355)   2,000 mg 250 mL/hr over 120 Minutes Intravenous  Once 06/02/13 2235 06/03/13 0237   06/02/13 2245  piperacillin-tazobactam (ZOSYN) IVPB 3.375 g     3.375 g 100 mL/hr over 30 Minutes Intravenous  Once 06/02/13 2235 06/08/13 0817      Medications: Scheduled Meds: . [START ON 07/02/2013] calcitRIOL  1 mcg Oral Q M,W,F-HD  .  ceFAZolin (ANCEF) IV  2 g Intravenous Q M,W,F-HD  . darbepoetin (ARANESP) injection - DIALYSIS  150 mcg Intravenous Q Sat-HD  . docusate sodium  100 mg Oral BID  . enoxaparin (LOVENOX) injection  100 mg Subcutaneous Q24H  . feeding  supplement (NEPRO CARB STEADY)  237 mL Oral BID BM  . feeding supplement (PRO-STAT SUGAR FREE 64)  30 mL Oral TID WC  . gabapentin  100 mg Oral TID  . insulin aspart  0-20 Units Subcutaneous TID WC  . labetalol  200 mg Oral BID  . multivitamin  1 tablet Oral QHS  . sevelamer carbonate  2,400 mg Oral TID WC  . sodium chloride  10-40 mL Intracatheter Q12H  . Warfarin - Pharmacist Dosing Inpatient   Does not apply q1800   Continuous Infusions: . sodium chloride 10 mL/hr at 06/22/13 2025  . sodium chloride 10 mL/hr at 06/30/13 0435   PRN Meds:.sodium chloride, sodium chloride, acetaminophen, acetaminophen, hydrALAZINE, HYDROmorphone (DILAUDID) injection, methocarbamol (ROBAXIN) IV, methocarbamol, metoCLOPramide (REGLAN) injection, metoCLOPramide, ondansetron (ZOFRAN) IV, ondansetron, oxyCODONE-acetaminophen, sodium chloride    Objective: Weight change: -6 lb 6.3 oz (-2.9 kg) No intake or output data in the 24 hours ending 07/01/13 2101 Blood pressure 178/93, pulse 86, temperature 99 F (37.2 C), temperature source Oral, resp. rate 18, height 6\' 1"  (1.854 m), weight 218 lb 14.7 oz (99.3 kg), SpO2 99.00%. Temp:  [97.9 F (36.6 C)-99.4 F (37.4 C)] 99 F (37.2 C) (06/07 1814) Pulse Rate:  [86-96] 86 (06/07 1833) Resp:  [17-18] 18 (06/07 1814) BP: (153-200)/(83-99) 178/93 mmHg (06/07 1835) SpO2:  [96 %-99 %] 99 % (06/07 1814) Weight:  [218 lb 14.7 oz (99.3 kg)] 218 lb 14.7 oz (99.3 kg) (06/06 2114)  Physical Exam: General: Alert and awake, oriented x3, not in any acute distress. HEENT: EOMI CVS regular rate, normal r Chest: no wheezing , Extremities: AKA site is bandaged  Neuro: nonfocal  CBC:  Recent Labs Lab 06/27/13 0530 06/27/13 1120 06/28/13 0420 06/29/13 0500 06/30/13 0535 07/01/13 0420  HGB 8.3* 7.8* 7.9* 8.1* 7.2* 9.0*  HCT 24.5* 23.4* 23.3* 24.7* 22.9* 28.1*  PLT 296 263 332 346 359 395  INR 1.30  --  1.27 1.20 1.13 1.41     BMET  Recent Labs   06/30/13 0535 06/30/13 0820  NA 135* 134*  K 4.4 4.1  CL 99 96  CO2 22 28  GLUCOSE 103* 102*  BUN 46* 24*  CREATININE 6.67* 3.65*  CALCIUM 7.6* 7.6*     Liver Panel   Recent Labs  06/30/13 0535 06/30/13 0820  ALBUMIN 1.2* 1.4*       Sedimentation Rate No results found for this basename: ESRSEDRATE,  in the last 72 hours C-Reactive Protein No results found for this basename: CRP,  in the last 72 hours  Micro Results: Recent Results (from the  past 240 hour(s))  SURGICAL PCR SCREEN     Status: None   Collection Time    06/27/13  1:31 AM      Result Value Ref Range Status   MRSA, PCR NEGATIVE  NEGATIVE Final   Staphylococcus aureus NEGATIVE  NEGATIVE Final   Comment:            The Xpert SA Assay (FDA     approved for NASAL specimens     in patients over 71 years of age),     is one component of     a comprehensive surveillance     program.  Test performance has     been validated by [employer] for patients greater     than or equal to 47 year old.     It is not intended     to diagnose infection nor to     guide or monitor treatment.  CULTURE, BLOOD (ROUTINE X 2)     Status: None   Collection Time    06/29/13  8:44 AM      Result Value Ref Range Status   Specimen Description BLOOD RIGHT HAND   Final   Special Requests BOTTLES DRAWN AEROBIC ONLY 10CC   Final   Culture  Setup Time     Final   Value: 06/30/2013 06:19     Performed at [facility]   Culture     Final   Value:        BLOOD CULTURE RECEIVED NO GROWTH TO DATE CULTURE WILL BE HELD FOR 5 DAYS BEFORE ISSUING A FINAL NEGATIVE REPORT     Performed at [facility]   Report Status PENDING   Incomplete  CULTURE, BLOOD (ROUTINE X 2)     Status: None   Collection Time    06/29/13  8:58 AM      Result Value Ref Range Status   Specimen Description BLOOD RIGHT ANTECUBITAL   Final   Special Requests BOTTLES DRAWN AEROBIC ONLY 10CC   Final   Culture  Setup Time     Final   Value:  06/30/2013 06:19     Performed at [facility]   Culture     Final   Value:        BLOOD CULTURE RECEIVED NO GROWTH TO DATE CULTURE WILL BE HELD FOR 5 DAYS BEFORE ISSUING A FINAL NEGATIVE REPORT     Performed at [facility]   Report Status PENDING   Incomplete    Studies/Results: No results found.    Assessment/Plan:  Principal Problem:   Severe sepsis Active Problems:   HTN (hypertension)   Pulmonary embolism, bilateral   Abscess of right lower leg   Encephalopathy, toxic   Anemia in chronic kidney disease   Arm edema   Leg abscess   Septic shock   Septic arthritis of ankle or foot, right   ESRD on dialysis   Hyperglycemia   Arm DVT (deep venous thromboembolism), acute   Acute blood loss anemia    Steven Logan is a 43 y.o. 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 We had MRI of L spine which showed 4 mm ventral epidural fluid collection extending 27 mm posterior to the L2 vertebral body.  # 1 Epidural fluid collection: concerning for infection. Likely due to MSSA bacteremia and seeding of this site  --plan on giving the patient  total of 8 weeks postoperative Ancef 2 grams with HD --would also plan on repeat MRI in next month  #2 MSSA necrotizing fascitis sp AKA: should be cured at this point  I will arrange for HSFU in our clinic in  The next 2 weeks  Please call with further questions.     LOS: 29 days   Steven Logan 07/01/2013, 9:01 PM

## 2013-07-02 LAB — CBC
HEMATOCRIT: 27.2 % — AB (ref 39.0–52.0)
Hemoglobin: 8.6 g/dL — ABNORMAL LOW (ref 13.0–17.0)
MCH: 28.3 pg (ref 26.0–34.0)
MCHC: 31.6 g/dL (ref 30.0–36.0)
MCV: 89.5 fL (ref 78.0–100.0)
Platelets: 419 10*3/uL — ABNORMAL HIGH (ref 150–400)
RBC: 3.04 MIL/uL — ABNORMAL LOW (ref 4.22–5.81)
RDW: 14.3 % (ref 11.5–15.5)
WBC: 11.3 10*3/uL — AB (ref 4.0–10.5)

## 2013-07-02 LAB — RENAL FUNCTION PANEL
ALBUMIN: 1.4 g/dL — AB (ref 3.5–5.2)
BUN: 46 mg/dL — AB (ref 6–23)
CALCIUM: 7.9 mg/dL — AB (ref 8.4–10.5)
CO2: 25 mEq/L (ref 19–32)
Chloride: 95 mEq/L — ABNORMAL LOW (ref 96–112)
Creatinine, Ser: 5.98 mg/dL — ABNORMAL HIGH (ref 0.50–1.35)
GFR calc Af Amer: 12 mL/min — ABNORMAL LOW (ref >90)
GFR calc non Af Amer: 10 mL/min — ABNORMAL LOW (ref >90)
Glucose, Bld: 92 mg/dL (ref 70–99)
PHOSPHORUS: 5.5 mg/dL — AB (ref 2.3–4.6)
Potassium: 4.3 mEq/L (ref 3.7–5.3)
Sodium: 133 mEq/L — ABNORMAL LOW (ref 137–147)

## 2013-07-02 LAB — GLUCOSE, CAPILLARY
GLUCOSE-CAPILLARY: 92 mg/dL (ref 70–99)
GLUCOSE-CAPILLARY: 107 mg/dL — AB (ref 70–99)
Glucose-Capillary: 86 mg/dL (ref 70–99)

## 2013-07-02 LAB — PROTIME-INR
INR: 1.62 — ABNORMAL HIGH (ref 0.00–1.49)
Prothrombin Time: 18.8 seconds — ABNORMAL HIGH (ref 11.6–15.2)

## 2013-07-02 MED ORDER — LIDOCAINE HCL (PF) 1 % IJ SOLN: 5.0000 mL | INTRAMUSCULAR | Status: DC | PRN

## 2013-07-02 MED ORDER — SODIUM CHLORIDE 0.9 % IV SOLN: 100.0000 mL | INTRAVENOUS | Status: DC | PRN

## 2013-07-02 MED ORDER — HEPARIN SODIUM (PORCINE) 1000 UNIT/ML DIALYSIS
1000.0000 [IU] | INTRAMUSCULAR | Status: DC | PRN
  Filled 2013-07-02: qty 1

## 2013-07-02 MED ORDER — WARFARIN SODIUM 2.5 MG PO TABS: ORAL_TABLET | ORAL | Status: DC

## 2013-07-02 MED ORDER — NEPRO/CARBSTEADY PO LIQD: 237.0000 mL | ORAL | Status: DC | PRN

## 2013-07-02 MED ORDER — PENTAFLUOROPROP-TETRAFLUOROETH EX AERO: 1.0000 "application " | INHALATION_SPRAY | CUTANEOUS | Status: DC | PRN

## 2013-07-02 MED ORDER — ALTEPLASE 2 MG IJ SOLR
2.0000 mg | Freq: Once | INTRAMUSCULAR | Status: DC | PRN
  Filled 2013-07-02: qty 2

## 2013-07-02 MED ORDER — WARFARIN SODIUM 7.5 MG PO TABS
7.5000 mg | ORAL_TABLET | Freq: Once | ORAL | Status: AC
  Administered 2013-07-02: 7.5 mg via ORAL
  Filled 2013-07-02: qty 1

## 2013-07-02 MED ORDER — LIDOCAINE-PRILOCAINE 2.5-2.5 % EX CREA: 1.0000 "application " | TOPICAL_CREAM | CUTANEOUS | Status: DC | PRN

## 2013-07-02 NOTE — Progress Notes (Signed)
PT Cancellation Note  Patient Details Name: Steven Logan MRN: [id number] DOB: August 12, 1970   Cancelled Treatment:    Reason Eval/Treat Not Completed: Patient at procedure or test/unavailable Currently in HD, and noted plans to dc post HD Will follow up as able while in-hospital  Thanks,  Roney Marion, [place] Pager [phone] Office [place] 07/02/2013, 10:46 AM

## 2013-07-02 NOTE — Procedures (Signed)
Pt seen on HD.  Ap 170  Vp 210. BFR 400.  Note plans for DC after HD.  Tolerating HD well so far.

## 2013-07-02 NOTE — Progress Notes (Signed)
Patient discharged to SNF([facility]) via ambulance. Patient alert and oriented, in no distress during discharge. Right AKA incision intact with no sign of infection, dressing changed prior to discharge. Multiple small spots of stage II on scarum noted wound bed pink with out any  Slough, no sign of infection or drainage noted. Report given to Olivia Canter at [facility].

## 2013-07-02 NOTE — Discharge Summary (Addendum)
[facility]  Physician Discharge Summary   Patient ID: Steven Logan MRN: [id number] DOB/AGE: 1970/07/18 43 y.o.  Admit date: 06/02/2013 Discharge date: 07/02/2013  PCP: Tivis Ringer, MD  DISCHARGE DIAGNOSES:  Principal Problem:   Severe sepsis Active Problems:   HTN (hypertension)   Pulmonary embolism, bilateral   Abscess of right lower leg   Encephalopathy, toxic   Anemia in chronic kidney disease   Arm edema   Leg abscess   Septic shock   Septic arthritis of ankle or foot, right   ESRD on dialysis   Hyperglycemia   Arm DVT (deep venous thromboembolism), acute   Acute blood loss anemia   RECOMMENDATIONS FOR OUTPATIENT FOLLOW UP: 1. Hemodialysis on MWF 2. Ancef with HD till July 17 3. On warfarin and Lovenox. Please manage per SNF protocol as noted below.  4. Monitor BP. He tends to run high. Adjust medications cautiously considering he is on dialysis. 5. Check CBG's TID. He is off long acting insulin.  DISCHARGE CONDITION: fair  Diet recommendation: Low Sodium  Filed Weights   06/30/13 1058 06/30/13 2114 07/01/13 2133  Weight: 98.6 kg (217 lb 6 oz) 99.3 kg (218 lb 14.7 oz) 99.837 kg (220 lb 1.6 oz)   Brief HPI:  43 year old male with past medical history of gout, HTN, DLD, PE, Pancreatitis, DM who presented to [facility] ED 06/02/2013 with worsening right ankle swelling for 2 weeks prior to this admission. Patient was started on prednisone for gout treatment initially which did not help. His wound opened up and started to have drainage. Patient subsequently re-presented to the ER. An MRI of the ankle revealed gas-containing abscess and cellulitis. Orthopedics took him emergently to the OR for debridement. He underwent right lower extremity transtibial amputation and remained on IV Zosyn. He continued to have fever post surgery. ID was consulted. Patient was also started on Vancomycin. He was diagnosed with acute nonocclusive DVT right upper extremity. 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 He had repeated CT LE due to persistent fever which show a large hematoma. Heparin has been on hold. He has received multiple Blood transfusion. 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   Procedures:  I&D on 06/03/2013  IRRIGATION AND DEBRIDEMENT EXTREMITY extensive excision of muscle fascia and soft tissue right leg 06/06/13  Transtibial amputation 5/19  I&D 5-31  Right BKA 5/19  Right AKA 6/3   Hospital Course:  Severe sepsis due to septic ankle & abscess of right lower leg / MSSA necrotizing fasciitis /Epidural Fluid Collection L2 Pt required multiple I&D's in OR by ortho; underwent right BKA 06/12/2013 and then right AKA for a hematoma on 6/3. Cultures from wound initially grew MSSA. Vanc was discontinued by ID. Zosyn was continued. ID was following and recommended changing to Cefazolin post surgery. Patient had 2 episodes of fever overnight on 6/4 upto 100.9. WBC not much different. Discussed with Dr. Baxter Flattery and she was concerned about possible spread even though patient remains asymptomatic except for pain in his stump. So MRI of T and L spine was obtained. There is a 48mm fluid collection in ventral aspect of L2. This could be infection. But IR cannot reach this area. Appreciate Neurosurgeon, Dr. Ronnald Ramp, input as well. Since patient does not have neurological deficits, he is not a candidate for surgery. Per ID he will now need prolonged antibiotics, 6 weeks, instead of 2 weeks.   Fever  Seems to be resolving. C diff negative. Chest x ray showed atelectasis. Blood culture 5-25  no growth to date. CT LE showed large, possibly infected, hematoma. Now status post right AKA. Low grade fevers noted. Possibly from same infection as above.   Diarrhea  Better after stopping laxatives. Imodium as needed. C diff was negative.  Acute nonocclusive DVT RUE  Jugular, subclavian, axillary, and brachial veins involved. Has had recent removal of HD cath from right IJ 5/10 (which has been in place  since Aug 2014). He was started on Heparin and coumadin. These had to be held for surgery and then resumed. Heparin switched to Lovenox. Continue Lovenox till INR is therapeutic (Goal INR: 2-3) for 2 consecutive days.  Anemia in chronic kidney disease / acute postoperative blood loss anemia  This was due to bleeding into right stump. Now s/p Right AKA. Has received multiple blood transfusions (At least 11 units of PRBC including 6/6). No overt bleeding noted. Hgb is stable.  Encephalopathy, toxic  Resolved. Was likely due to sepsis.  End stage renal disease on HD M/W/F  He was followed by Nephrology. Has fistula left UE.   Hypertension  BP fluctuates. Continue current medications. Monitor for now without additional changes.   DM / Hyperglycemia  A1c 7.0; good glycemic control. Had hypoglycemic episodes and Lantus was discontinued. No further episodes of hypoglycemia. CBG less than 150. No LA insulin for now.   Hyperparathyroidism  Continue Calcitrol and renvela   Patient has been stable for last few days. He is ready for SNF after long hospital stay.   PERTINENT LABS: The results of significant diagnostics from this hospitalization (including imaging, microbiology, ancillary and laboratory) are listed below for reference.    Microbiology: Recent Results (from the past 240 hour(s))  SURGICAL PCR SCREEN     Status: None   Collection Time    06/27/13  1:31 AM      Result Value Ref Range Status   MRSA, PCR NEGATIVE  NEGATIVE Final   Staphylococcus aureus NEGATIVE  NEGATIVE Final   Comment:            The Xpert SA Assay (FDA     approved for NASAL specimens     in patients over 36 years of age),     is one component of     a comprehensive surveillance     program.  Test performance has     been validated by [employer] for patients greater     than or equal to 30 year old.     It is not intended     to diagnose infection nor to     guide or monitor treatment.  CULTURE,  BLOOD (ROUTINE X 2)     Status: None   Collection Time    06/29/13  8:44 AM      Result Value Ref Range Status   Specimen Description BLOOD RIGHT HAND   Final   Special Requests BOTTLES DRAWN AEROBIC ONLY 10CC   Final   Culture  Setup Time     Final   Value: 06/30/2013 06:19     Performed at [facility]   Culture     Final   Value:        BLOOD CULTURE RECEIVED NO GROWTH TO DATE CULTURE WILL BE HELD FOR 5 DAYS BEFORE ISSUING A FINAL NEGATIVE REPORT     Performed at [facility]   Report Status PENDING   Incomplete  CULTURE, BLOOD (ROUTINE X 2)     Status: None  Collection Time    06/29/13  8:58 AM      Result Value Ref Range Status   Specimen Description BLOOD RIGHT ANTECUBITAL   Final   Special Requests BOTTLES DRAWN AEROBIC ONLY 10CC   Final   Culture  Setup Time     Final   Value: 06/30/2013 06:19     Performed at [facility]   Culture     Final   Value:        BLOOD CULTURE RECEIVED NO GROWTH TO DATE CULTURE WILL BE HELD FOR 5 DAYS BEFORE ISSUING A FINAL NEGATIVE REPORT     Performed at [facility]   Report Status PENDING   Incomplete     Labs: Basic Metabolic Panel:  Recent Labs Lab 06/27/13 1120 06/28/13 0420 06/30/13 0535 06/30/13 0820 07/02/13 0835  NA 134* 134* 135* 134* 133*  K 3.8 3.8 4.4 4.1 4.3  CL 97 96 99 96 95*  CO2 24 23 22 28 25   GLUCOSE 119* 122* 103* 102* 92  BUN 38* 45* 46* 24* 46*  CREATININE 7.03* 7.97* 6.67* 3.65* 5.98*  CALCIUM 7.3* 7.1* 7.6* 7.6* 7.9*  PHOS  --   --  6.9* 3.5 5.5*   Liver Function Tests:  Recent Labs Lab 06/30/13 0535 06/30/13 0820 07/02/13 0835  ALBUMIN 1.2* 1.4* 1.4*   CBC:  Recent Labs Lab 06/28/13 0420 06/29/13 0500 06/30/13 0535 07/01/13 0420 07/02/13 0835  WBC 14.5* 13.7* 11.7* 11.3* 11.3*  HGB 7.9* 8.1* 7.2* 9.0* 8.6*  HCT 23.3* 24.7* 22.9* 28.1* 27.2*  MCV 88.3 90.1 90.9 90.9 89.5  PLT 332 346 359 395 419*   CBG:  Recent Labs Lab 07/01/13 0819  07/01/13 1127 07/01/13 1624 07/01/13 2131 07/02/13 0742  GLUCAP 91 114* 104* 109* 86     IMAGING STUDIES Dg Chest 2 View  06/20/2013   CLINICAL DATA:  Fever.  EXAM: CHEST - 2 VIEW  COMPARISON:  06/18/2013  FINDINGS: There is stable positioning of a left jugular central line with the catheter tip at the brachiocephalic venous confluence. There is some residual scarring and atelectasis at the left lung base. Lung volumes are low. No overt edema or airspace consolidation is seen. No evidence of pleural effusion. The heart is mildly enlarged and stable in appearance. The bony thorax shows stable degenerative changes of the thoracic spine.  IMPRESSION: Scarring/atelectasis at the left lung base.   Electronically Signed   By: Aletta Edouard M.D.   On: 06/20/2013 14:24   Dg Chest 2 View  06/02/2013   CLINICAL DATA:  Chest pain.  EXAM: CHEST  2 VIEW  COMPARISON:  05/29/2013 and 05/04/2012 as well as chest CT 05/29/2013  FINDINGS: The lungs are hypoinflated with mild interval improvement in right base opacification likely improving effusions/atelectasis. Cannot exclude infection in the right base. Cardiomediastinal silhouette and remainder of the exam is unchanged.  IMPRESSION: Improving right base process likely effusion and atelectasis. Cannot completely exclude infection in the right base.   Electronically Signed   By: Marin Olp M.D.   On: 06/02/2013 15:19   Dg Ankle Complete Right  06/02/2013   CLINICAL DATA:  Edema, swelling, open wound lateral right ankle  EXAM: RIGHT ANKLE - COMPLETE 3+ VIEW  COMPARISON:  None.  FINDINGS: There is no acute fracture no subluxation. The bones are osteopenic likely secondary to hypovolemia. Soft tissue swelling is again appreciated. There is no evidence of cortical destruction.  IMPRESSION: No acute osseous abnormalities.  Diffuse soft  tissue swelling.   Electronically Signed   By: Margaree Mackintosh M.D.   On: 06/02/2013 16:54   Mr Thoracic Spine Wo  Contrast  06/29/2013   CLINICAL DATA:  Persistent fever. Necrotizing fasciitis. Status post above the knee amputation. The patient is unable to have contrast due to dialysis.  EXAM: MRI THORACIC AND LUMBAR SPINE WITHOUT CONTRAST  TECHNIQUE: Multiplanar and multiecho pulse sequences of the thoracic and lumbar spine were obtained without intravenous contrast.  COMPARISON:  None.  FINDINGS: MR THORACIC SPINE FINDINGS  The study is mildly degraded by patient motion. Words signal is within normal limits. Marrow signal is diffusely depressed, compatible with anemia. The disc levels at T7-8 and above are normal.  T8-9: A left paramedian disc protrusion is present. This contacts and distorts the ventral surface of the cord.  No other significant disc protrusion or stenosis is present. No focal epidural process or infection is evident.  MR LUMBAR SPINE FINDINGS  Normal signal is present in the conus medullaris which terminates at T12-L1, within normal limits. Marrow signal is diffusely depressed. Grade 1 anterolisthesis is evident at L4-5. A cystic lesion is noted in the right kidney. Limited imaging of the abdomen is otherwise unremarkable. A ventral epidural fluid collection is present at L2. This measures 4 mm in maximal AP diameter. It extends 27 mm cephalo caudad.  Short pedicles and disc bulging at L1-2 results in mild left foraminal narrowing.  L2-3: Mild disc bulging and short pedicles are present without significant stenosis.  L3-4: Mild disc bulging and facet hypertrophy is present. Mild lateral recess and foraminal narrowing is evident bilaterally.  L4-5: A prominent central protrusion is evident. Moderate lateral recess narrowing is worse left than right. Mild foraminal narrowing is worse on the left.  L5-S1: A shallow central disc protrusion is present. Mild lateral recess narrowing is worse on the right.  IMPRESSION: MR THORACIC SPINE IMPRESSION  1. Left paramedian disc protrusion at T8-9 with mild central  canal stenosis. Next item no other significant disc protrusion or stenosis. 2. Diffuse loss of normal marrow signal compatible with anemia. 3. No other focal stenosis or a source of infection in the thoracic spine.  MR LUMBAR SPINE IMPRESSION  1. 4 mm ventral epidural fluid collection extending 27 mm posterior to the L2 vertebral body. This could be related to infection. There is no adjacent source. The disc levels are normal. This creates minimal mass effect. 2. Short pedicles and disc bulging in the lumbar spine as described. 3. Prominent central disc protrusion at L4-5 with moderate lateral recess narrowing, left greater than right.   Electronically Signed   By: Lawrence Santiago M.D.   On: 06/29/2013 21:22   Mr Lumbar Spine Wo Contrast  06/29/2013   CLINICAL DATA:  Persistent fever. Necrotizing fasciitis. Status post above the knee amputation. The patient is unable to have contrast due to dialysis.  EXAM: MRI THORACIC AND LUMBAR SPINE WITHOUT CONTRAST  TECHNIQUE: Multiplanar and multiecho pulse sequences of the thoracic and lumbar spine were obtained without intravenous contrast.  COMPARISON:  None.  FINDINGS: MR THORACIC SPINE FINDINGS  The study is mildly degraded by patient motion. Words signal is within normal limits. Marrow signal is diffusely depressed, compatible with anemia. The disc levels at T7-8 and above are normal.  T8-9: A left paramedian disc protrusion is present. This contacts and distorts the ventral surface of the cord.  No other significant disc protrusion or stenosis is present. No focal epidural process or infection is  evident.  MR LUMBAR SPINE FINDINGS  Normal signal is present in the conus medullaris which terminates at T12-L1, within normal limits. Marrow signal is diffusely depressed. Grade 1 anterolisthesis is evident at L4-5. A cystic lesion is noted in the right kidney. Limited imaging of the abdomen is otherwise unremarkable. A ventral epidural fluid collection is present at L2. This  measures 4 mm in maximal AP diameter. It extends 27 mm cephalo caudad.  Short pedicles and disc bulging at L1-2 results in mild left foraminal narrowing.  L2-3: Mild disc bulging and short pedicles are present without significant stenosis.  L3-4: Mild disc bulging and facet hypertrophy is present. Mild lateral recess and foraminal narrowing is evident bilaterally.  L4-5: A prominent central protrusion is evident. Moderate lateral recess narrowing is worse left than right. Mild foraminal narrowing is worse on the left.  L5-S1: A shallow central disc protrusion is present. Mild lateral recess narrowing is worse on the right.  IMPRESSION: MR THORACIC SPINE IMPRESSION  1. Left paramedian disc protrusion at T8-9 with mild central canal stenosis. Next item no other significant disc protrusion or stenosis. 2. Diffuse loss of normal marrow signal compatible with anemia. 3. No other focal stenosis or a source of infection in the thoracic spine.  MR LUMBAR SPINE IMPRESSION  1. 4 mm ventral epidural fluid collection extending 27 mm posterior to the L2 vertebral body. This could be related to infection. There is no adjacent source. The disc levels are normal. This creates minimal mass effect. 2. Short pedicles and disc bulging in the lumbar spine as described. 3. Prominent central disc protrusion at L4-5 with moderate lateral recess narrowing, left greater than right.   Electronically Signed   By: Lawrence Santiago M.D.   On: 06/29/2013 21:22   Ct Tibia Fibula Right Wo Contrast  06/23/2013   CLINICAL DATA:  Lower extremity gangrene. Necrotizing fasciitis. Incision and drainage and right below-the-knee amputation. Persistent fever and stump tenderness with erythema.  EXAM: CT OF THE LOWER right EXTREMITY WITHOUT CONTRAST  TECHNIQUE: Contiguous axial images were obtained without IV contrast. Coronal and sagittal reconstructed images were created.  COMPARISON:  5/5, 5/9 2015.  FINDINGS: Below-the-knee amputation is present. There  is no evidence of osteomyelitis. The osteotomy margins appear sharp.  There is a tiny amount of gas in the soft tissues around the stump, which potentially could be postsurgical. There is a large heterogeneous fluid collection compatible with hematoma along the dorsolateral aspect and posterior aspect of the stump that measures 12 cm transverse by 7 cm AP. This is difficult to distinguish from the myocutaneous flap. The hematoma extends cranially in the posterior lateral soft tissues with another focus of clot measuring 10 cm craniocaudal by 7 cm transverse and 6 cm AP posterior to the fibula.  Aggregate dimensions of the hematoma are given in the impression.  Skin staples are present over the amputation site. There is no gas dissecting through the soft tissues. The knee joint appears within normal limits.  IMPRESSION: 1. Below-the-knee amputation with normal appearance of the osteotomy. No osteomyelitis. 2. Large (20 cm x 9 cm x 12 cm) multiloculated hematoma in the posterior leg extending from the region of the myocutaneous flap along the dorsal leg to the popliteal fossa. 3. Tiny locule of gas within the hematoma could be postsurgical. Superimposed infection of the hematoma cannot be excluded.   Electronically Signed   By: Dereck Ligas M.D.   On: 06/23/2013 13:39   Mr Ankle Right  Wo  Contrast  06/02/2013   CLINICAL DATA:  Right lateral ankle wound.  EXAM: MRI OF THE RIGHT ANKLE WITHOUT CONTRAST  TECHNIQUE: Multiplanar, multisequence MR imaging of the ankle was performed. No intravenous contrast was administered.  COMPARISON:  None.  FINDINGS: There is a large fluid collection in the dorsal ankle, which is suspicious for abscess. There appear to be due tiny loculated gas bubbles within this collection. This is heterogeneous and dissects around the posterior aspect of the ankle.  Craniocaudal extent of this collection as 11.4 cm. Transverse measurement is 7.9 cm. AP thickness is difficult to measure because of  the crescentic shaped conforming to the posterior aspect of the ankle but measures at least 3 cm in maximal thickness. This likely abscess infiltrates between the Achilles tendon, the posterior medial tendons and lies dorsal to the peroneal tendons. Diffuse subcutaneous edema is present in the distal leg and ankle extending to the forefoot.  Bone marrow signal is markedly abnormal, with heterogeneous signal in the distal tibia and fibula, probably due to renal osteodystrophy. Osteochondral lesion of the medial talar dome is present with subchondral marrow edema. No fracture is present. Large ankle effusion. Minimal fluid is present in the posterior medial and posterior lateral tendon sheaths.  IMPRESSION: Large heterogeneous fluid collection in the posterior ankle, anterior to the Achilles tendon consistent with abscess. There appear to be tiny loculated gas centrally, suggesting infection with gas-forming organism.  No convincing evidence of osteomyelitis. Large ankle effusion. Septic arthritis cannot be excluded.   Electronically Signed   By: Dereck Ligas M.D.   On: 06/02/2013 22:09   Dg Chest Port 1 View  06/18/2013   CLINICAL DATA:  Fever.  EXAM: PORTABLE CHEST - 1 VIEW  COMPARISON:  06/06/2013 and 05/29/2013 and chest CT dated 05/29/2013  FINDINGS: The loculated right pleural effusion has diminished. There are no new infiltrates or effusions. PICC tip is in the superior vena cava at the level of the azygos vein. All Heart size and vascularity are normal.  IMPRESSION: No acute abnormalities. Almost complete resolution of the loculated effusion at the right lung base.  Critical Value/emergent results were called by telephone at the time of interpretation on 06/18/2013 at 8:25 AM to [place], RN, who verbally acknowledged these results.   Electronically Signed   By: Rozetta Nunnery M.D.   On: 06/18/2013 08:25   Dg Chest Port 1 View  06/06/2013   CLINICAL DATA:  Status post central line placement  EXAM: PORTABLE  CHEST - 1 VIEW  COMPARISON:  DG CHEST 2 VIEW dated 06/02/2013  FINDINGS: The patient has undergone placement of a central venous catheter via the left internal jugular approach. The tip of the catheter lies at the level of the junction of the right and left brachiocephalic veins. There is no evidence of a post procedure pneumothorax. The lung volumes are low. There is atelectasis and/or pleural fluid at the right lung base. The cardiopericardial silhouette remains enlarged. The pulmonary vascularity is not engorged.  IMPRESSION: There is no evidence of postprocedure complication following placement of the left internal jugular venous catheter.   Electronically Signed   By: David  Martinique   On: 06/06/2013 16:16    DISCHARGE EXAMINATION: Filed Vitals:   07/02/13 0542 07/02/13 [id number] 07/02/13 0834 07/02/13 0930  BP: 158/82 201/91 205/86 159/95  Pulse: 85 87 87 86  Temp: 97.7 F (36.5 C) 98.5 F (36.9 C)    TempSrc: Oral Oral    Resp: 17 18 18  18  Height:      Weight:      SpO2: 97% 97%     General appearance: alert, cooperative, appears stated age and no distress Resp: clear to auscultation bilaterally Cardio: regular rate and rhythm, S1, S2 normal, no murmur, click, rub or gallop GI: soft, non-tender; bowel sounds normal; no masses,  no organomegaly Extremities: s/p right AKA. Able to lif both legs off the bed.  DISPOSITION: SNF Discharge Instructions   Diet - low sodium heart healthy    Complete by:  As directed      Discharge instructions    Complete by:  As directed   See instructions on DC summary     Increase activity slowly    Complete by:  As directed            ALLERGIES: No Known Allergies  Current Discharge Medication List    START taking these medications   Details  acetaminophen (TYLENOL) 325 MG tablet Take 2 tablets (650 mg total) by mouth every 6 (six) hours as needed for mild pain (or Fever >/= 101).    Amino Acids-Protein Hydrolys (FEEDING SUPPLEMENT, PRO-STAT  SUGAR FREE 64,) LIQD Take 30 mLs by mouth 3 (three) times daily with meals. Qty: 900 mL, Refills: 0    calcitRIOL (ROCALTROL) 0.5 MCG capsule Take 2 capsules (1 mcg total) by mouth every Monday, Wednesday, and Friday with hemodialysis.    ceFAZolin (ANCEF) 2-3 GM-% SOLR Inject 50 mLs (2 g total) into the vein every Monday, Wednesday, and Friday with hemodialysis. For 6 weeks. End date: 08/10/13    docusate sodium 100 MG CAPS Take 100 mg by mouth 2 (two) times daily. Qty: 10 capsule, Refills: 0    enoxaparin (LOVENOX) 100 MG/ML injection Inject 1 mL (100 mg total) into the skin daily. Please give till INR is between 2 and 3 for 2 consecutive days. Then it can be stopped. Qty: 0 Syringe    gabapentin (NEURONTIN) 100 MG capsule Take 1 capsule (100 mg total) by mouth 3 (three) times daily.    methocarbamol (ROBAXIN) 500 MG tablet Take 1 tablet (500 mg total) by mouth every 6 (six) hours as needed for muscle spasms. Qty: 30 tablet, Refills: 0    multivitamin (RENA-VIT) TABS tablet Take 1 tablet by mouth at bedtime. Refills: 0    Nutritional Supplements (FEEDING SUPPLEMENT, NEPRO CARB STEADY,) LIQD Take 237 mLs by mouth 2 (two) times daily between meals. Refills: 0    warfarin (COUMADIN) 2.5 MG tablet 7.5mg  on 6/8 and then 5mg  daily with daily PT/INR per SNF protocol. Goal INR: 2-3. Overlap with Lovenox till INR is therapeutic for 2 consecutive days.      CONTINUE these medications which have CHANGED   Details  oxyCODONE-acetaminophen (PERCOCET/ROXICET) 5-325 MG per tablet Take 1-2 tablets by mouth every 4 (four) hours as needed for severe pain. Qty: 30 tablet, Refills: 0    sevelamer carbonate (RENVELA) 800 MG tablet Take 3 tablets (2,400 mg total) by mouth 3 (three) times daily with meals.      CONTINUE these medications which have NOT CHANGED   Details  labetalol (NORMODYNE) 200 MG tablet Take 200 mg by mouth 2 (two) times daily.       Follow-up Information   Follow up with  DUDA,MARCUS V, MD. Schedule an appointment as soon as possible for a visit in 1 week. (For wound re-check, post hospitalization follow up)    Specialty:  Orthopedic Surgery   Contact information:   [address]  [place] [address] [phone]       Follow up with Dialysis. (on Mondays', wednesday's and Friday's)       Follow up with Tivis Ringer, MD. Schedule an appointment as soon as possible for a visit in 1 week. (post hospitalization follow up)    Specialty:  Internal Medicine   Contact information:   [place] [place] [address] [phone]       Follow up with Carlyle Basques, MD. (she will arrange follow up in office. Will likely need repeat MRI L spine before end of aantibiotic course.)    Specialty:  Infectious Diseases   Contact information:   [address] [place] [address] [phone]       TOTAL DISCHARGE TIME: 35 mins  [facility] Pager [phone]  07/02/2013, 11:05 AM  Disclaimer: This note was dictated with voice recognition software. Similar sounding words can inadvertently be transcribed and may not be corrected upon review.

## 2013-07-02 NOTE — Progress Notes (Signed)
ANTICOAGULATION CONSULT NOTE - Follow Up Consult  Pharmacy Consult for Warfarin + Lovenox Indication: newly diagnosed RUE DVT + hx PE  No Known Allergies  Patient Measurements: Height: 6\' 1"  (185.4 cm) Weight: 220 lb 1.6 oz (99.837 kg) IBW/kg (Calculated) : 79.9 Heparin Dosing Weight: n/a  Vital Signs: Temp: 98.5 F (36.9 C) (06/08 0822) Temp src: Oral (06/08 0822) BP: 159/95 mmHg (06/08 0930) Pulse Rate: 86 (06/08 0930)  Labs:  Recent Labs  06/30/13 0535 06/30/13 0820 07/01/13 0420 07/02/13 0835  HGB 7.2*  --  9.0* 8.6*  HCT 22.9*  --  28.1* 27.2*  PLT 359  --  395 419*  LABPROT 14.3  --  16.9* 18.8*  INR 1.13  --  1.41 1.62*  CREATININE 6.67* 3.65*  --  5.98*    Estimated Creatinine Clearance: 19.8 ml/min (by C-G formula based on Cr of 5.98).   Medications:  Scheduled:  . calcitRIOL  1 mcg Oral Q M,W,F-HD  .  ceFAZolin (ANCEF) IV  2 g Intravenous Q M,W,F-HD  . darbepoetin (ARANESP) injection - DIALYSIS  150 mcg Intravenous Q Sat-HD  . docusate sodium  100 mg Oral BID  . enoxaparin (LOVENOX) injection  100 mg Subcutaneous Q24H  . feeding supplement (NEPRO CARB STEADY)  237 mL Oral BID BM  . feeding supplement (PRO-STAT SUGAR FREE 64)  30 mL Oral TID WC  . gabapentin  100 mg Oral TID  . insulin aspart  0-20 Units Subcutaneous TID WC  . labetalol  200 mg Oral BID  . multivitamin  1 tablet Oral QHS  . sevelamer carbonate  2,400 mg Oral TID WC  . sodium chloride  10-40 mL Intracatheter Q12H  . Warfarin - Pharmacist Dosing Inpatient   Does not apply q1800    Assessment: 63 YOM started on lovenox + warfarin for new RUE DVT diagnosed this admit. 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 Sharol Given)   Full-dose lovenox is not recommended in ESRD due to risk of accumulation/bleeding. This was discussed with Dr. [place] Pink and he wishes  to proceed with monitoring of levels and s/sx of bleeding. INR today remains SUBtherapeutic though trending up (INR 1.62 << 1.41, goal of 2-3). Since Vit K 10 mg given on 6/2 - has been requiring larger doses of Coumadin, but suspect effect should start to be less soon. Hgb/Hct slight drop, plts 419 << 395. No active bleeding noted.   Noted plans for discharge today - would recommend warfarin 7.5 mg for one more dose then dropping down to 5 mg daily based on previous tends this admission. Lovenox should be discontinued as soon as the INR>2.  Goal of Therapy:  INR 2-3 Anti-Xa level 0.6-1 units/ml 4hrs after LMWH dose given Monitor platelets by anticoagulation protocol: Yes   Plan:  1. Continue Lovenox 100 mg SQ every 24 hours (will not check a level as the patient is planning to be discharged prior to when this is due) 2. Warfarin 7.5 mg x 1 today (if still here) 3. Will continue to monitor for any signs/symptoms of bleeding and will follow up with PT/INR in the a.m.   Alycia Rossetti, PharmD, BCPS Clinical Pharmacist Pager: [phone] 07/02/2013 10:37 AM

## 2013-07-02 NOTE — Progress Notes (Signed)
Patient ID: Steven Logan, male   DOB: 1970/02/08, 44 y.o.   MRN: [id number] Patient states planning for discharge after dialysis today. We will have the dressing changed prior to discharge. I will followup in the office in one week. Activities of the weekend noted.

## 2013-07-06 LAB — CULTURE, BLOOD (ROUTINE X 2)
Culture: NO GROWTH
Culture: NO GROWTH

## 2013-07-19 ENCOUNTER — Ambulatory Visit (INDEPENDENT_AMBULATORY_CARE_PROVIDER_SITE_OTHER): Payer: BC Managed Care – PPO | Admitting: Internal Medicine

## 2013-07-19 ENCOUNTER — Encounter: Payer: Self-pay | Admitting: Internal Medicine

## 2013-07-19 VITALS — BP 167/90 | HR 94 | Temp 98.4°F | Ht 73.0 in | Wt 208.0 lb

## 2013-07-19 DIAGNOSIS — A4901 Methicillin susceptible Staphylococcus aureus infection, unspecified site: Secondary | ICD-10-CM

## 2013-07-19 NOTE — Progress Notes (Signed)
Patient ID: Steven Logan, male   DOB: 1970-02-06, 43 y.o.   MRN: [id number]         [facility]  Patient Active Problem List   Diagnosis Date Noted  . Acute blood loss anemia 06/25/2013  . Hyperglycemia 06/07/2013  . Arm DVT (deep venous thromboembolism), acute 06/07/2013  . ESRD on dialysis 06/05/2013  . Encephalopathy, toxic 06/03/2013  . Cellulitis and abscess of leg 06/03/2013  . Anemia in chronic kidney disease 06/03/2013  . Arm edema 06/03/2013  . Leg abscess 06/03/2013  . Severe sepsis 06/03/2013  . Septic shock 06/03/2013  . Septic arthritis of ankle or foot, right 06/03/2013  . Abscess of right lower leg 06/02/2013  . Wrist lump 07/05/2012  . Swelling of limb-Left index and middle fingers 07/05/2012  . Aftercare following surgery of the circulatory system, [place] 07/05/2012  . Tingling-left wrist and some pain. 07/05/2012  . Pulmonary edema 05/04/2012  . Acute respiratory failure 05/04/2012  . CKD (chronic kidney disease) stage 5, GFR less than 15 ml/min 05/04/2012  . Hyperkalemia 05/04/2012  . Pre-operative cardiovascular examination 03/15/2012  . Iron deficiency anemia 09/22/2010  . HTN (hypertension) 09/22/2010  . Hyperlipidemia 09/22/2010  . Gout 09/22/2010  . CRI (chronic renal insufficiency) 09/22/2010  . Pulmonary embolism, bilateral 09/22/2010    Patient's Medications  New Prescriptions   No medications on file  Previous Medications   ACETAMINOPHEN (TYLENOL) 325 MG TABLET    Take 2 tablets (650 mg total) by mouth every 6 (six) hours as needed for mild pain (or Fever >/= 101).   AMINO ACIDS-PROTEIN HYDROLYS (FEEDING SUPPLEMENT, PRO-STAT SUGAR FREE 64,) LIQD    Take 30 mLs by mouth 3 (three) times daily with meals.   CALCITRIOL (ROCALTROL) 0.5 MCG CAPSULE    Take 2 capsules (1 mcg total) by mouth every Monday, Wednesday, and Friday with hemodialysis.   CEFAZOLIN (ANCEF) 2-3 GM-% SOLR    Inject 50 mLs (2 g total) into the vein  every Monday, Wednesday, and Friday with hemodialysis. For 6 weeks. End date: 08/10/13   DOCUSATE SODIUM 100 MG CAPS    Take 100 mg by mouth 2 (two) times daily.   ENOXAPARIN (LOVENOX) 100 MG/ML INJECTION    Inject 1 mL (100 mg total) into the skin daily. Please give till INR is between 2 and 3 for 2 consecutive days. Then it can be stopped.   GABAPENTIN (NEURONTIN) 100 MG CAPSULE    Take 1 capsule (100 mg total) by mouth 3 (three) times daily.   LABETALOL (NORMODYNE) 200 MG TABLET    Take 200 mg by mouth 2 (two) times daily.   METHOCARBAMOL (ROBAXIN) 500 MG TABLET    Take 1 tablet (500 mg total) by mouth every 6 (six) hours as needed for muscle spasms.   MULTIVITAMIN (RENA-VIT) TABS TABLET    Take 1 tablet by mouth at bedtime.   NUTRITIONAL SUPPLEMENTS (FEEDING SUPPLEMENT, NEPRO CARB STEADY,) LIQD    Take 237 mLs by mouth 2 (two) times daily between meals.   OXYCODONE-ACETAMINOPHEN (PERCOCET/ROXICET) 5-325 MG PER TABLET    Take 1-2 tablets by mouth every 4 (four) hours as needed for severe pain.   SEVELAMER CARBONATE (RENVELA) 800 MG TABLET    Take 3 tablets (2,400 mg total) by mouth 3 (three) times daily with meals.   WARFARIN (COUMADIN) 2.5 MG TABLET    7.5mg  on 6/8 and then 5mg  daily with daily PT/INR per SNF protocol. Goal INR: 2-3. Overlap with  Lovenox till INR is therapeutic for 2 consecutive days.  Modified Medications   No medications on file  Discontinued Medications   No medications on file    Subjective: [name] is in today for his hospital followup visit. A close friend, who is his health care power of attorney, is with him. He was hospitalized from May 9 to June 9 with severe MSSA infection of his right leg. His initial blood cultures were negative. 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 He continued to have some intermittent postoperative fevers and leukocytosis. Further evaluation revealed a 4 mm ventral epidural fluid collection at  the level of L2. Partners recommended extending IV cefazolin therapy through July 17. He is currently residing in a skilled nursing facility. He is feeling better. He continues to have some phantom leg pain. He's only had one low-grade fever since discharge. He is tolerating his antibiotic well. Review of Systems: Pertinent items are noted in HPI.  Past Medical History  Diagnosis Date  . Eczema   . Morbid obesity   . Gout   . HTN (hypertension)   . Dyslipidemia   . Pulmonary embolism     3  in  lungs  at  one time...  . Abscess     great toe  . Pancreatitis   . Arthritis   . Depression   . Diabetes mellitus   . Anemia   . Pneumonia     2-3 years ago  . Shortness of breath     ?? chest  cold he has now.  10/8- no SOB  . Renal hypertension     Hemo started  Sept 2014- MWF  . Chronic kidney disease     esrd    History  Substance Use Topics  . Smoking status: Never Smoker   . Smokeless tobacco: Never Used  . Alcohol Use: No     Comment:  24oz beer  per week.  10/8 no drinking    Family History  Problem Relation Age of Onset  . Stroke Mother   . Diabetes Mother   . Hypertension Mother   . Cancer    . Coronary artery disease    . Hypertension Father   . Colon cancer Maternal Grandmother 44  . Hypertension Brother     No Known Allergies  Objective: Temp: 98.4 F (36.9 C) (06/25 1358) Temp src: Oral (06/25 1358) BP: 167/90 mmHg (06/25 1358) Pulse Rate: 94 (06/25 1358)  General: He is slightly uncomfortable due to some headache but otherwise in no distress. He is seated in his wheelchair Skin: No rash Lungs: Clear Cor: Regular S1 and S2 with no murmurs Joints and extremities: His left arm hemodialysis access has a good thrill and appears normal. They state that his AKA wound is healing nicely and is not examined today.    Assessment: He is improving slowly on therapy for recent severe staph aureus infection of his right leg complicated by possible lumbar  infection.  Plan: 1. Continue cefazolin after hemodialysis through July 17 2. Followup in 4 weeks   Michel Bickers, MD [facility] for [place] [phone] pager   [phone] cell 07/19/2013, 2:13 PM

## 2013-08-27 ENCOUNTER — Ambulatory Visit: Payer: BC Managed Care – PPO | Admitting: Internal Medicine

## 2013-09-04 ENCOUNTER — Encounter: Payer: Self-pay | Admitting: Internal Medicine

## 2013-09-04 ENCOUNTER — Ambulatory Visit (INDEPENDENT_AMBULATORY_CARE_PROVIDER_SITE_OTHER): Payer: BC Managed Care – PPO | Admitting: Internal Medicine

## 2013-09-04 VITALS — BP 106/69 | HR 78 | Temp 97.7°F | Ht 74.0 in | Wt 203.0 lb

## 2013-09-04 DIAGNOSIS — A4901 Methicillin susceptible Staphylococcus aureus infection, unspecified site: Secondary | ICD-10-CM

## 2013-09-04 NOTE — Progress Notes (Signed)
Patient ID: [name], male   DOB: 1970-12-26, 43 y.o.   MRN: [id number]         [facility]  Patient Active Problem List   Diagnosis Date Noted  . Acute blood loss anemia 06/25/2013  . Hyperglycemia 06/07/2013  . Arm DVT (deep venous thromboembolism), acute 06/07/2013  . ESRD on dialysis 06/05/2013  . Encephalopathy, toxic 06/03/2013  . Cellulitis and abscess of leg 06/03/2013  . Anemia in chronic kidney disease 06/03/2013  . Arm edema 06/03/2013  . Leg abscess 06/03/2013  . Severe sepsis 06/03/2013  . Septic shock 06/03/2013  . Septic arthritis of ankle or foot, right 06/03/2013  . Abscess of right lower leg 06/02/2013  . Wrist lump 07/05/2012  . Swelling of limb-Left index and middle fingers 07/05/2012  . Aftercare following surgery of the circulatory system, [place] 07/05/2012  . Tingling-left wrist and some pain. 07/05/2012  . Pulmonary edema 05/04/2012  . Acute respiratory failure 05/04/2012  . CKD (chronic kidney disease) stage 5, GFR less than 15 ml/min 05/04/2012  . Hyperkalemia 05/04/2012  . Pre-operative cardiovascular examination 03/15/2012  . Iron deficiency anemia 09/22/2010  . HTN (hypertension) 09/22/2010  . Hyperlipidemia 09/22/2010  . Gout 09/22/2010  . CRI (chronic renal insufficiency) 09/22/2010  . Pulmonary embolism, bilateral 09/22/2010    Patient's Medications  New Prescriptions   No medications on file  Previous Medications   ACETAMINOPHEN (TYLENOL) 325 MG TABLET    Take 2 tablets (650 mg total) by mouth every 6 (six) hours as needed for mild pain (or Fever >/= 101).   AMINO ACIDS-PROTEIN HYDROLYS (FEEDING SUPPLEMENT, PRO-STAT SUGAR FREE 64,) LIQD    Take 30 mLs by mouth 3 (three) times daily with meals.   CALCITRIOL (ROCALTROL) 0.5 MCG CAPSULE    Take 2 capsules (1 mcg total) by mouth every Monday, Wednesday, and Friday with hemodialysis.   DOCUSATE SODIUM 100 MG CAPS    Take 100 mg by mouth 2 (two) times daily.   ENOXAPARIN (LOVENOX) 100 MG/ML INJECTION    Inject 1 mL (100 mg total) into the skin daily. Please give till INR is between 2 and 3 for 2 consecutive days. Then it can be stopped.   GABAPENTIN (NEURONTIN) 100 MG CAPSULE    Take 1 capsule (100 mg total) by mouth 3 (three) times daily.   LABETALOL (NORMODYNE) 200 MG TABLET    Take 200 mg by mouth 2 (two) times daily.   METHOCARBAMOL (ROBAXIN) 500 MG TABLET    Take 1 tablet (500 mg total) by mouth every 6 (six) hours as needed for muscle spasms.   MULTIVITAMIN (RENA-VIT) TABS TABLET    Take 1 tablet by mouth at bedtime.   NUTRITIONAL SUPPLEMENTS (FEEDING SUPPLEMENT, NEPRO CARB STEADY,) LIQD    Take 237 mLs by mouth 2 (two) times daily between meals.   OXYCODONE-ACETAMINOPHEN (PERCOCET/ROXICET) 5-325 MG PER TABLET    Take 1-2 tablets by mouth every 4 (four) hours as needed for severe pain.   SEVELAMER CARBONATE (RENVELA) 800 MG TABLET    Take 3 tablets (2,400 mg total) by mouth 3 (three) times daily with meals.   WARFARIN (COUMADIN) 2.5 MG TABLET    7.5mg  on 6/8 and then 5mg  daily with daily PT/INR per SNF protocol. Goal INR: 2-3. Overlap with Lovenox till INR is therapeutic for 2 consecutive days.  Modified Medications   No medications on file  Discontinued Medications   CEFAZOLIN (ANCEF) 2-3 GM-% SOLR    Inject 50  mLs (2 g total) into the vein every Monday, Wednesday, and Friday with hemodialysis. For 6 weeks. End date: 08/10/13    Subjective: Mr. Haefs is in for his routine followup. He completed 3 months of cefazolin on July 17 for his severe MSSA infection. He has had no fever or other signs of relapse since completing antibiotic therapy. Review of Systems: Pertinent items are noted in HPI.  Past Medical History  Diagnosis Date  . Eczema   . Morbid obesity   . Gout   . HTN (hypertension)   . Dyslipidemia   . Pulmonary embolism     3  in  lungs  at  one time...  . Abscess     great toe  . Pancreatitis   . Arthritis   . Depression     . Diabetes mellitus   . Anemia   . Pneumonia     2-3 years ago  . Shortness of breath     ?? chest  cold he has now.  10/8- no SOB  . Renal hypertension     Hemo started  Sept 2014- MWF  . Chronic kidney disease     esrd    History  Substance Use Topics  . Smoking status: Never Smoker   . Smokeless tobacco: Never Used  . Alcohol Use: No     Comment:  24oz beer  per week.  10/8 no drinking    Family History  Problem Relation Age of Onset  . Stroke Mother   . Diabetes Mother   . Hypertension Mother   . Cancer    . Coronary artery disease    . Hypertension Father   . Colon cancer Maternal Grandmother 38  . Hypertension Brother     No Known Allergies  Objective: Temp: 97.7 F (36.5 C) (08/11 1008) Temp src: Oral (08/11 1008) BP: 106/69 mmHg (08/11 1008) Pulse Rate: 78 (08/11 1008)  General: He is in good spirits eating in his wheelchair Skin: No rash Lungs: Clear Cor: Regular S1 and S2 with no murmurs Joints and extremities: Good thrill in left arm fistula. Right AKA incision fully healed without evidence of infection      Assessment: His severe MSSA infection has resolved.  Plan: 1. Continue observation off of antibiotics 2. Followup here as needed   Michel Bickers, MD [facility] for [place] [phone] pager   [phone] cell 09/04/2013, 10:19 AM

## 2013-11-08 ENCOUNTER — Ambulatory Visit: Payer: BC Managed Care – PPO | Attending: Orthopedic Surgery | Admitting: Physical Therapy

## 2013-11-08 DIAGNOSIS — R5381 Other malaise: Secondary | ICD-10-CM | POA: Diagnosis not present

## 2013-11-08 DIAGNOSIS — R269 Unspecified abnormalities of gait and mobility: Secondary | ICD-10-CM | POA: Diagnosis not present

## 2013-11-08 DIAGNOSIS — Z89611 Acquired absence of right leg above knee: Secondary | ICD-10-CM | POA: Insufficient documentation

## 2013-12-25 ENCOUNTER — Encounter: Payer: Self-pay | Admitting: Physical Therapy

## 2013-12-26 ENCOUNTER — Ambulatory Visit: Payer: BC Managed Care – PPO | Attending: Orthopedic Surgery | Admitting: Physical Therapy

## 2013-12-26 ENCOUNTER — Encounter: Payer: Self-pay | Admitting: Physical Therapy

## 2013-12-26 DIAGNOSIS — R269 Unspecified abnormalities of gait and mobility: Secondary | ICD-10-CM | POA: Diagnosis not present

## 2013-12-26 DIAGNOSIS — Z89611 Acquired absence of right leg above knee: Secondary | ICD-10-CM

## 2013-12-26 DIAGNOSIS — R5381 Other malaise: Secondary | ICD-10-CM | POA: Insufficient documentation

## 2013-12-26 DIAGNOSIS — R6889 Other general symptoms and signs: Secondary | ICD-10-CM

## 2013-12-26 NOTE — Therapy (Signed)
[facility] [address] [place], [place], [address] Phone: [phone]   Fax:  [phone]  Physical Therapy Treatment  Patient Details  Name: Steven Logan MRN: [id number] Date of Birth: Nov 06, 1970  Encounter Date: 12/26/2013      PT End of Session - 12/26/13 1351    Visit Number 2   Number of Visits 17   PT Start Time 1020   PT Stop Time 1102   PT Time Calculation (min) 42 min   Equipment Utilized During Treatment Gait belt   Activity Tolerance Patient tolerated treatment well;Patient limited by fatigue   Behavior During Therapy [facility] for tasks assessed/performed      Past Medical History  Diagnosis Date  . Eczema   . Morbid obesity   . Gout   . HTN (hypertension)   . Dyslipidemia   . Pulmonary embolism     3  in  lungs  at  one time...  . Abscess     great toe  . Pancreatitis   . Arthritis   . Depression   . Diabetes mellitus   . Anemia   . Pneumonia     2-3 years ago  . Shortness of breath     ?? chest  cold he has now.  10/8- no SOB  . Renal hypertension     Hemo started  Sept 2014- MWF  . Chronic kidney disease     esrd    Past Surgical History  Procedure Laterality Date  . None    . Av fistula placement Left 04/07/2012    Procedure: ARTERIOVENOUS (AV) FISTULA CREATION;  Surgeon: Angelia Mould, MD;  Location: [place];  Service: Vascular;  Laterality: Left;  . Ligation of competing branches of arteriovenous fistula Left 09/19/2012    Procedure: LIGATION OF COMPETING BRANCHES OF LEFT ARM ARTERIOVENOUS FISTULA; Vein angioplasty;  Surgeon: Angelia Mould, MD;  Location: [place];  Service: Vascular;  Laterality: Left;  . Insertion of dialysis catheter Right 09/19/2012    Procedure: INSERTION OF DIALYSIS CATHETER;  Surgeon: Angelia Mould, MD;  Location: [place];  Service: Vascular;  Laterality: Right;  . I&d extremity Right 06/02/2013    Procedure: IRRIGATION AND DEBRIDEMENT ARTHROSCOPIC RIGHT  ANKLE;  Surgeon: Augustin Schooling, MD;  Location: [facility];  Service: Orthopedics;  Laterality: Right;  . I&d extremity Right 06/04/2013    Procedure: IRRIGATION AND DEBRIDEMENT RIGHT FOOT/ANKLE;  Surgeon: Newt Minion, MD;  Location: [facility];  Service: Orthopedics;  Laterality: Right;  . I&d extremity Right 06/06/2013    Procedure: IRRIGATION AND DEBRIDEMENT EXTREMITY;  Surgeon: Newt Minion, MD;  Location: [place];  Service: Orthopedics;  Laterality: Right;  . Amputation Right 06/12/2013    Procedure: Transtibial Amputation;  Surgeon: Newt Minion, MD;  Location: [facility];  Service: Orthopedics;  Laterality: Right;  . I&d extremity Right 06/24/2013    Procedure: IRRIGATION AND DEBRIDEMENT and Revsion of TRANSTIBIAL AMPUTATION;  Surgeon: Newt Minion, MD;  Location: [place];  Service: Orthopedics;  Laterality: Right;  . Amputation Right 06/27/2013    Procedure: AMPUTATION ABOVE KNEE;  Surgeon: Newt Minion, MD;  Location: [facility];  Service: Orthopedics;  Laterality: Right;  Right Above Knee Amputation    There were no vitals taken for this visit.  Visit Diagnosis:  Abnormality of gait  Lack of coordination  Status post above knee amputation of right lower extremity      Subjective Assessment - 12/26/13 1025  Symptoms No falls. Wore new R AKA prosthesis 2 hours x1 yesterday. Patient received prosthesis yesterday (12/25/2013)   Currently in Pain? Yes   Pain Score 5    Pain Location Leg   Pain Orientation Right   Pain Type Phantom pain   Pain Onset Today   Pain Frequency Occasional   Aggravating Factors  Cold, Rain   Pain Relieving Factors Tylenol            [facility] Adult PT Treatment/Exercise - 12/26/13 1015    Transfers   Transfers Sit to Stand;Stand to Sit   Sit to Stand 4: Min guard;Without upper extremity assist;From chair/3-in-1;With upper extremity assist   Sit to Stand Details (indicate cue type and reason) cues on sequencing   Stand to Sit 4: Min guard;With upper extremity  assist;To chair/3-in-1;With armrests   Stand to Sit Details cues on sequencing   Ambulation/Gait   Ambulation/Gait Yes   Ambulation/Gait Assistance 4: Min assist   Ambulation/Gait Assistance Details pt ambulates requiring significant UE use to maintain balance. Pt cued on foot placement, posture, and step length.   Ambulation Distance (Feet) 55 Feet   Assistive device Rolling walker;Other (Comment)  R BKA prosthesis   Gait Pattern Step-to pattern;Decreased step length - left;Decreased stance time - right;Trunk flexed  increased toe in on RLE   Gait velocity decreased   Posture/Postural Control   Posture/Postural Control Postural limitations   Postural Limitations Rounded Shoulders;Forward head   Standardized Balance Assessment   Standardized Balance Assessment Berg Balance Test   Berg Balance Test   Sit to Stand Able to stand using hands after several tries   Standing Unsupported Able to stand 2 minutes with supervision   Sitting with Back Unsupported but Feet Supported on Floor or Stool Able to sit safely and securely 2 minutes   Stand to Sit Controls descent by using hands   Transfers Able to transfer safely, definite need of hands   Standing Unsupported with Eyes Closed Able to stand 10 seconds with supervision   Standing Ubsupported with Feet Together Needs help to attain position but able to stand for 30 seconds with feet together   From Standing, Reach Forward with Outstretched Arm Reaches forward but needs supervision   From Standing Position, Pick up Object from Floor Unable to pick up and needs supervision   From Standing Position, Turn to Look Behind Over each Shoulder Turn sideways only but maintains balance   Turn 360 Degrees Needs assistance while turning   Standing Unsupported, Alternately Place Feet on Step/Stool Needs assistance to keep from falling or unable to try   Standing Unsupported, One Foot in [employer] balance while stepping or standing   Standing on One Leg  Unable to try or needs assist to prevent fall   Total Score 23   Prosthetics   Prosthetic Care Comments  Patient recieved prosthesis (R AKA) yesterday and wore it for 2 hours per prosthetist prescription. Pt instructed to wear prosthesis 2 hours 2x/day. Pt stated he has not been wearing the shrinker sock as frequently as prescribed and verbalized understanding he will wear the liner or shrinker sock at all times except when bathing. Patient required increased time to settle into prosthesis to lock in.   Current prosthetic wear tolerance (days/week)  1   Current prosthetic wear tolerance (#hours/day)  2 hours   Residual limb condition  in tact, no change in integumentary   Education Provided Skin check;Residual limb care;Prosthetic cleaning;Proper wear schedule/adjustment, proper donning technique especially  rotation   Person(s) Educated Patient   Education Method Explanation;Demonstration   Education Method Verbalized understanding   Donning Prosthesis Minimal assist   Doffing Prosthesis Minimal assist          PT Education - 12/26/13 1351    Education provided Yes   Education Details Prosthetic education   Person(s) Educated Patient   Methods Explanation;Demonstration   Comprehension Verbalized understanding;Need further instruction          PT Short Term Goals - 12/26/13 1706    PT SHORT TERM GOAL #1   Title Verbalize prpoper prosthetic care except cues to adjust the number of ply socks (01/17/14)   Time 4   Period Weeks   Status New   PT SHORT TERM GOAL #2   Title Tolerate wear of prosthesis >10 hours without change in skin integrity nor undue tenderness (01/17/14)   Time 4   Period Weeks   Status New   PT SHORT TERM GOAL #3   Title Ambulate 39ft with rolling walker and prosthesis modified  (01/17/14)   Time 4   Period Weeks   Status New   PT SHORT TERM GOAL #4   Title Negotiate ramp, curb, stairs (1 rail) with rolling walker and prosthesis modified indepent   Time  4   Period Weeks   Status New          PT Long Term Goals - 12/26/13 1716    PT LONG TERM GOAL #1   Title Demonstrate and verbalize proper prosthetic care to enable safe utilization of the prosthesis (02/15/14)   Time 8   Period Weeks   Status New   PT LONG TERM GOAL #2   Title Tolerate wear of prosthesic care to enable safe utilization of the prosthesis (02/15/14)   Time 8   Period Weeks   Status New   PT LONG TERM GOAL #3   Title Ambulate 586ft with LRAD and prosthesis independently (02/15/14)   Time 8   Period Weeks   Status New   PT LONG TERM GOAL #4   Title Ambulate 216ft on uneven (grass) surfaces with LRAD and prosthesis independently (02/15/14)   Time 8   Period Weeks   Status New   PT LONG TERM GOAL #5   Title Negotiate ramp, curb, stairs with LRAD and prosthesis independently (02/15/14)   Time 8   Period Weeks   Status New   Additional Long Term Goals   Additional Long Term Goals Yes   PT LONG TERM GOAL #6   Title Perform standing activities for >20 minutes with prosthesis with no pain or discomfort (02/15/14)   Time 8   Period Weeks   Status New   PT LONG TERM GOAL #7   Title Berg Balance test score with prosthesis >/= 45/56 (02/15/14)   Time 8   Period Weeks   Status New          Plan - 12/26/13 1356    Clinical Impression Statement Patient tolerated treatment well but limited in mobility by decreased balance and overall activity tolerance due to recent inactivity. Patient is motivated to increase activity to return to work and with now having prosthesis is motivated to improve balance and independence with mobility.    Pt will benefit from skilled therapeutic intervention in order to improve on the following deficits Abnormal gait;Difficulty walking;Decreased endurance;Decreased activity tolerance;Decreased balance;Decreased mobility;Decreased strength   Rehab Potential Good   PT Frequency 2x / week   PT Duration 8 weeks  PT Treatment/Interventions  Therapeutic activities;ADLs/Self Care Home Management;Patient/family education;DME Instruction;Therapeutic exercise;Gait training;Balance training;Manual techniques;Stair training;Neuromuscular re-education;Energy conservation;Functional mobility training;Other (comment)  prosthetic training   PT Next Visit Plan Continue mobility assessment with prosthesis and RW (ramp/curb/stiars); sink balance exercises   Consulted and Agree with Plan of Care Patient         Problem List Patient Active Problem List   Diagnosis Date Noted  . Acute blood loss anemia 06/25/2013  . Hyperglycemia 06/07/2013  . Arm DVT (deep venous thromboembolism), acute 06/07/2013  . ESRD on dialysis 06/05/2013  . Encephalopathy, toxic 06/03/2013  . Cellulitis and abscess of leg 06/03/2013  . Anemia in chronic kidney disease 06/03/2013  . Arm edema 06/03/2013  . Leg abscess 06/03/2013  . Severe sepsis 06/03/2013  . Septic shock 06/03/2013  . Septic arthritis of ankle or foot, right 06/03/2013  . Abscess of right lower leg 06/02/2013  . Wrist lump 07/05/2012  . Swelling of limb-Left index and middle fingers 07/05/2012  . Aftercare following surgery of the circulatory system, [place] 07/05/2012  . Tingling-left wrist and some pain. 07/05/2012  . Pulmonary edema 05/04/2012  . Acute respiratory failure 05/04/2012  . CKD (chronic kidney disease) stage 5, GFR less than 15 ml/min 05/04/2012  . Hyperkalemia 05/04/2012  . Pre-operative cardiovascular examination 03/15/2012  . Iron deficiency anemia 09/22/2010  . HTN (hypertension) 09/22/2010  . Hyperlipidemia 09/22/2010  . Gout 09/22/2010  . CRI (chronic renal insufficiency) 09/22/2010  . Pulmonary embolism, bilateral 09/22/2010     All PT delivered under supervision of Licensed Physical Therapist.   Blima Rich, Student PT 12/26/2013, 5:22 PM    This entire session was performed under direct supervision and direction of a licensed therapist/therapist  assistant . I have personally read, edited and approve of the note as written.  Jamey Reas, PT, DPT PT Specializing in [place] 12/27/2013 8:57 AM Phone:  [phone]  Fax:  [phone] [place] [address] [place], [place] [address]

## 2013-12-27 ENCOUNTER — Encounter: Payer: Self-pay | Admitting: Physical Therapy

## 2013-12-27 ENCOUNTER — Ambulatory Visit: Payer: BC Managed Care – PPO | Admitting: Physical Therapy

## 2013-12-27 NOTE — Therapy (Signed)
[facility] [address] [place], [place], [address] Phone: [phone]   Fax:  [phone]  Physical Therapy Treatment  Patient Details  Name: Steven Logan MRN: [id number] Date of Birth: June 28, 1970  Encounter Date: 12/26/2013      PT End of Session - 12/26/13 1351    Visit Number 2   Number of Visits 17   PT Start Time 1020   PT Stop Time 1102   PT Time Calculation (min) 42 min   Equipment Utilized During Treatment Gait belt   Activity Tolerance Patient tolerated treatment well;Patient limited by fatigue   Behavior During Therapy [facility] for tasks assessed/performed      Past Medical History  Diagnosis Date  . Eczema   . Morbid obesity   . Gout   . HTN (hypertension)   . Dyslipidemia   . Pulmonary embolism     3  in  lungs  at  one time...  . Abscess     great toe  . Pancreatitis   . Arthritis   . Depression   . Diabetes mellitus   . Anemia   . Pneumonia     2-3 years ago  . Shortness of breath     ?? chest  cold he has now.  10/8- no SOB  . Renal hypertension     Hemo started  Sept 2014- MWF  . Chronic kidney disease     esrd    Past Surgical History  Procedure Laterality Date  . None    . Av fistula placement Left 04/07/2012    Procedure: ARTERIOVENOUS (AV) FISTULA CREATION;  Surgeon: Angelia Mould, MD;  Location: [place];  Service: Vascular;  Laterality: Left;  . Ligation of competing branches of arteriovenous fistula Left 09/19/2012    Procedure: LIGATION OF COMPETING BRANCHES OF LEFT ARM ARTERIOVENOUS FISTULA; Vein angioplasty;  Surgeon: Angelia Mould, MD;  Location: [place];  Service: Vascular;  Laterality: Left;  . Insertion of dialysis catheter Right 09/19/2012    Procedure: INSERTION OF DIALYSIS CATHETER;  Surgeon: Angelia Mould, MD;  Location: [place];  Service: Vascular;  Laterality: Right;  . I&d extremity Right 06/02/2013    Procedure: IRRIGATION AND DEBRIDEMENT ARTHROSCOPIC RIGHT  ANKLE;  Surgeon: Augustin Schooling, MD;  Location: [place];  Service: Orthopedics;  Laterality: Right;  . I&d extremity Right 06/04/2013    Procedure: IRRIGATION AND DEBRIDEMENT RIGHT FOOT/ANKLE;  Surgeon: Newt Minion, MD;  Location: [place];  Service: Orthopedics;  Laterality: Right;  . I&d extremity Right 06/06/2013    Procedure: IRRIGATION AND DEBRIDEMENT EXTREMITY;  Surgeon: Newt Minion, MD;  Location: [place];  Service: Orthopedics;  Laterality: Right;  . Amputation Right 06/12/2013    Procedure: Transtibial Amputation;  Surgeon: Newt Minion, MD;  Location: [place];  Service: Orthopedics;  Laterality: Right;  . I&d extremity Right 06/24/2013    Procedure: IRRIGATION AND DEBRIDEMENT and Revsion of TRANSTIBIAL AMPUTATION;  Surgeon: Newt Minion, MD;  Location: [place];  Service: Orthopedics;  Laterality: Right;  . Amputation Right 06/27/2013    Procedure: AMPUTATION ABOVE KNEE;  Surgeon: Newt Minion, MD;  Location: [place];  Service: Orthopedics;  Laterality: Right;  Right Above Knee Amputation    There were no vitals taken for this visit.  Visit Diagnosis:  Abnormality of gait  Status post above knee amputation of right lower extremity  Activity intolerance      Subjective Assessment - 12/26/13 1025  Symptoms No falls. Wore new R AKA prosthesis 2 hours x1 yesterday. Patient received prosthesis yesterday (12/25/2013)   Currently in Pain? Yes   Pain Score 5    Pain Location Leg   Pain Orientation Right   Pain Type Phantom pain   Pain Onset Today   Pain Frequency Occasional   Aggravating Factors  Cold, Rain   Pain Relieving Factors Tylenol            [facility] Adult PT Treatment/Exercise - 12/26/13 1015    Transfers   Transfers Sit to Stand;Stand to Sit   Sit to Stand 4: Min guard;Without upper extremity assist;From chair/3-in-1;With upper extremity assist   Sit to Stand Details (indicate cue type and reason) cues on sequencing   Stand to Sit 4: Min guard;With upper extremity  assist;To chair/3-in-1;With armrests   Stand to Sit Details cues on sequencing   Ambulation/Gait   Ambulation/Gait Yes   Ambulation/Gait Assistance 4: Min assist   Ambulation/Gait Assistance Details pt ambulates requiring significant UE use to maintain balance. Pt cued on foot placement, posture, and step length.   Ambulation Distance (Feet) 55 Feet   Assistive device Rolling walker;Other (Comment)  R BKA prosthesis   Gait Pattern Step-to pattern;Decreased step length - left;Decreased stance time - right;Trunk flexed  increased toe in on RLE   Gait velocity decreased   Posture/Postural Control   Posture/Postural Control Postural limitations   Postural Limitations Rounded Shoulders;Forward head   Standardized Balance Assessment   Standardized Balance Assessment Berg Balance Test   Berg Balance Test   Sit to Stand Able to stand using hands after several tries   Standing Unsupported Able to stand 2 minutes with supervision   Sitting with Back Unsupported but Feet Supported on Floor or Stool Able to sit safely and securely 2 minutes   Stand to Sit Controls descent by using hands   Transfers Able to transfer safely, definite need of hands   Standing Unsupported with Eyes Closed Able to stand 10 seconds with supervision   Standing Ubsupported with Feet Together Needs help to attain position and unable to hold for 15 seconds   From Standing, Reach Forward with Outstretched Arm Loses balance while trying/requires external support   From Standing Position, Pick up Object from Floor Unable to try/needs assist to keep balance   From Standing Position, Turn to Look Behind Over each Shoulder Needs supervision when turning   Turn 360 Degrees Needs assistance while turning   Standing Unsupported, Alternately Place Feet on Step/Stool Needs assistance to keep from falling or unable to try   Standing Unsupported, One Foot in [employer] balance while stepping or standing   Standing on One Leg Unable to  try or needs assist to prevent fall   Total Score 19   Prosthetics   Prosthetic Care Comments  Patient recieved prosthesis (R AKA) yesterday and wore it for 2 hours per prosthetist prescription. Pt instructed to wear prosthesis 2 hours 2x/day. Pt stated he has not been wearing the shrinker sock as frequently as prescribed and verbalized understanding he will wear the liner or shrinker sock at all times except when bathing. Patient required increased time to settle into prosthesis to lock in.   Current prosthetic wear tolerance (days/week)  1   Current prosthetic wear tolerance (#hours/day)  2 hours   Residual limb condition  in tact, no change in integumentary   Education Provided Skin check;Residual limb care;Prosthetic cleaning;Proper wear schedule/adjustment   Person(s) Educated Patient   Education  Method Explanation;Demonstration   Education Method Verbalized understanding   Donning Prosthesis Minimal assist   Doffing Prosthesis Minimal assist          PT Education - 12/26/13 1351    Education provided Yes   Education Details Prosthetic education   Person(s) Educated Patient   Methods Explanation;Demonstration   Comprehension Verbalized understanding;Need further instruction          PT Short Term Goals - 12/26/13 1706    PT SHORT TERM GOAL #1   Title Verbalize prpoper prosthetic care except cues to adjust the number of ply socks (Target Date: 01/17/14)   Baseline Baseline on 12/26/13: Patient recieved his first prosthesis on 12/25/13. He is totally dependent in knowledge of all aspects of care and requires minimal assist to donne.   Time 4   Period Weeks   Status New   PT SHORT TERM GOAL #2   Title Tolerate wear of prosthesis >10 hours without change in skin integrity nor undue tenderness (Target Date: 01/17/14)   Baseline Baseline on 12/26/13: Patient recieved his first prosthesis on 12/25/13. Patient has not worn prosthesis outside of PT prior to 12/26/13 appt.   Time 4    Period Weeks   Status New   PT SHORT TERM GOAL #3   Title Ambulate 341ft with rolling walker and prosthesis modified  (Target Date: 01/17/14)   Baseline Baseline on 12/26/13: Patient recieved his first prosthesis on 12/25/13. Patient ambulated 36' with walker & prosthesis with minimal physical assist and maximal verbal cues.   Time 4   Period Weeks   Status New   PT SHORT TERM GOAL #4   Title Negotiate ramp, curb, stairs (1 rail) with rolling walker and prosthesis modified indepent (Target Date: 01/17/14)   Baseline Baseline on 12/26/13: Patient recieved his first prosthesis on 12/25/13. Patient is currently dependent with negotiating barriers with prosthesis.   Time 4   Period Weeks   Status New          PT Long Term Goals - 12/26/13 1716    PT LONG TERM GOAL #1   Title Demonstrate and verbalize proper prosthetic care to enable safe utilization of the prosthesis (Target Date: 02/15/14)   Baseline Baseline on 12/26/13: Patient recieved his first prosthesis on 12/25/13. He is totally dependent in knowledge for all aspects of prosthetic care and requires minimal assist to donne.   Time 8   Period Weeks   Status New   PT LONG TERM GOAL #2   Title Tolerate wear of prosthesis for >90% of awake hours without change in skin integrity nor undue tenderness.  (Target Date: 02/15/14)   Baseline Baseline on 12/26/13: Patient recieved his first prosthesis on 12/25/13. Patient has not worn prosthesis prior to PT visit on 12/26/13.   Time 8   Period Weeks   Status New   PT LONG TERM GOAL #3   Title Ambulate 578ft with LRAD and prosthesis independently (Target Date: 02/15/14)   Baseline Baseline on 12/26/13: Patient recieved his first prosthesis on 12/25/13. Patient ambulates 26' with walker and prosthesis with minimal physical assist and maximal verbal cues.   Time 8   Period Weeks   Status New   PT LONG TERM GOAL #4   Title Ambulate 262ft on uneven (grass) surfaces with LRAD and prosthesis independently  (Target Date: 02/15/14)   Baseline Baseline on 12/26/13: Patient recieved his first prosthesis on 12/25/13. Patient is unable to ambulate on uneven surfaces.   Time 8   Period  Weeks   Status New   PT LONG TERM GOAL #5   Title Negotiate ramp, curb, stairs with LRAD and prosthesis independently (Target Date: 02/15/14)   Baseline Baseline on 12/26/13: Patient recieved his first prosthesis on 12/25/13. Patient is unable to negotiate barriers with prosthesis.   Time 8   Period Weeks   Status New   Additional Long Term Goals   Additional Long Term Goals Yes   PT LONG TERM GOAL #6   Title Perform standing activities for >20 minutes with prosthesis with no pain or discomfort (Target Date: 02/15/14)   Baseline Baseline on 12/26/13: Patient recieved his first prosthesis on 12/25/13. Patient tolerates 5 minutes of standing with only partial weight on prosthesis using walker for support.   Time 8   Period Weeks   Status New   PT LONG TERM GOAL #7   Title Berg Balance test score with prosthesis >/= 45/56 (Target Date: 02/15/14)   Baseline Baseline on 12/26/13: Patient recieved his first prosthesis on 12/25/13. Berg Balance 19/56 on 12/26/13.   Time 8   Period Weeks   Status New          Plan - 12/26/13 1356    Clinical Impression Statement Patient recieved his first prosthesis on 12/25/13 and was referred back to PT per plan. He has not been treated by PT since evaluation on 11/08/13 until he recieved the prosthesis. Patient is totally dependent in all aspects of care / use of a prosthesis. He has high fall risk noted by Merrilee Jansky Balance score of 19/56. He is dependent in all aspects of gait including household  & community mobility.   Pt will benefit from skilled therapeutic intervention in order to improve on the following deficits Abnormal gait;Difficulty walking;Decreased endurance;Decreased activity tolerance;Decreased balance;Decreased mobility;Decreased strength;Decreased knowledge of use of DME;Other  (comment)  prosthetic dependency   Rehab Potential Good   PT Frequency 2x / week   PT Duration 8 weeks   PT Treatment/Interventions Therapeutic activities;ADLs/Self Care Home Management;Patient/family education;DME Instruction;Therapeutic exercise;Gait training;Balance training;Manual techniques;Stair training;Neuromuscular re-education;Energy conservation;Functional mobility training;Other (comment)  prosthetic training   PT Next Visit Plan review prosthetic care, gait with rolling walker, pre-gait in parallel bars to improve swing & stance   PT Home Exercise Plan HEP at sink for midline.   Consulted and Agree with Plan of Care Patient        Problem List Patient Active Problem List   Diagnosis Date Noted  . Acute blood loss anemia 06/25/2013  . Hyperglycemia 06/07/2013  . Arm DVT (deep venous thromboembolism), acute 06/07/2013  . ESRD on dialysis 06/05/2013  . Encephalopathy, toxic 06/03/2013  . Cellulitis and abscess of leg 06/03/2013  . Anemia in chronic kidney disease 06/03/2013  . Arm edema 06/03/2013  . Leg abscess 06/03/2013  . Severe sepsis 06/03/2013  . Septic shock 06/03/2013  . Septic arthritis of ankle or foot, right 06/03/2013  . Abscess of right lower leg 06/02/2013  . Wrist lump 07/05/2012  . Swelling of limb-Left index and middle fingers 07/05/2012  . Aftercare following surgery of the circulatory system, [place] 07/05/2012  . Tingling-left wrist and some pain. 07/05/2012  . Pulmonary edema 05/04/2012  . Acute respiratory failure 05/04/2012  . CKD (chronic kidney disease) stage 5, GFR less than 15 ml/min 05/04/2012  . Hyperkalemia 05/04/2012  . Pre-operative cardiovascular examination 03/15/2012  . Iron deficiency anemia 09/22/2010  . HTN (hypertension) 09/22/2010  . Hyperlipidemia 09/22/2010  . Gout 09/22/2010  . CRI (chronic renal insufficiency) 09/22/2010  .  Pulmonary embolism, bilateral 09/22/2010    Emalynn Clewis 12/27/2013, 9:28 AM  Jamey Reas,  PT, DPT PT Specializing in Prosthetics & Orthotics 12/27/2013 9:30 AM Phone:  [phone]  Fax:  [phone] [place] [address] [place], [place] [address]

## 2014-01-01 ENCOUNTER — Ambulatory Visit: Payer: BC Managed Care – PPO | Admitting: Physical Therapy

## 2014-01-01 ENCOUNTER — Encounter: Payer: Self-pay | Admitting: Physical Therapy

## 2014-01-01 DIAGNOSIS — R6889 Other general symptoms and signs: Secondary | ICD-10-CM

## 2014-01-01 DIAGNOSIS — R269 Unspecified abnormalities of gait and mobility: Secondary | ICD-10-CM

## 2014-01-01 DIAGNOSIS — Z89611 Acquired absence of right leg above knee: Secondary | ICD-10-CM

## 2014-01-02 NOTE — Therapy (Signed)
[facility] [address] [place], [place], [address] Phone: [phone]   Fax:  [phone]  Physical Therapy Treatment  Patient Details  Name: Steven Logan MRN: [id number] Date of Birth: 1970/06/08  Encounter Date: 01/01/2014      PT End of Session - 01/02/14 1333    Visit Number 3   Number of Visits 17   Date for PT Re-Evaluation 01/17/14   PT Start Time 0850   PT Stop Time 0932   PT Time Calculation (min) 42 min   Equipment Utilized During Treatment Gait belt   Activity Tolerance Patient tolerated treatment well   Behavior During Therapy [facility] for tasks assessed/performed      Past Medical History  Diagnosis Date  . Eczema   . Morbid obesity   . Gout   . HTN (hypertension)   . Dyslipidemia   . Pulmonary embolism     3  in  lungs  at  one time...  . Abscess     great toe  . Pancreatitis   . Arthritis   . Depression   . Diabetes mellitus   . Anemia   . Pneumonia     2-3 years ago  . Shortness of breath     ?? chest  cold he has now.  10/8- no SOB  . Renal hypertension     Hemo started  Sept 2014- MWF  . Chronic kidney disease     esrd    Past Surgical History  Procedure Laterality Date  . None    . Av fistula placement Left 04/07/2012    Procedure: ARTERIOVENOUS (AV) FISTULA CREATION;  Surgeon: Angelia Mould, MD;  Location: [place];  Service: Vascular;  Laterality: Left;  . Ligation of competing branches of arteriovenous fistula Left 09/19/2012    Procedure: LIGATION OF COMPETING BRANCHES OF LEFT ARM ARTERIOVENOUS FISTULA; Vein angioplasty;  Surgeon: Angelia Mould, MD;  Location: [place];  Service: Vascular;  Laterality: Left;  . Insertion of dialysis catheter Right 09/19/2012    Procedure: INSERTION OF DIALYSIS CATHETER;  Surgeon: Angelia Mould, MD;  Location: [place];  Service: Vascular;  Laterality: Right;  . I&d extremity Right 06/02/2013    Procedure: IRRIGATION AND DEBRIDEMENT  ARTHROSCOPIC RIGHT ANKLE;  Surgeon: Augustin Schooling, MD;  Location: [place];  Service: Orthopedics;  Laterality: Right;  . I&d extremity Right 06/04/2013    Procedure: IRRIGATION AND DEBRIDEMENT RIGHT FOOT/ANKLE;  Surgeon: Newt Minion, MD;  Location: [place];  Service: Orthopedics;  Laterality: Right;  . I&d extremity Right 06/06/2013    Procedure: IRRIGATION AND DEBRIDEMENT EXTREMITY;  Surgeon: Newt Minion, MD;  Location: [place];  Service: Orthopedics;  Laterality: Right;  . Amputation Right 06/12/2013    Procedure: Transtibial Amputation;  Surgeon: Newt Minion, MD;  Location: [place];  Service: Orthopedics;  Laterality: Right;  . I&d extremity Right 06/24/2013    Procedure: IRRIGATION AND DEBRIDEMENT and Revsion of TRANSTIBIAL AMPUTATION;  Surgeon: Newt Minion, MD;  Location: [place];  Service: Orthopedics;  Laterality: Right;  . Amputation Right 06/27/2013    Procedure: AMPUTATION ABOVE KNEE;  Surgeon: Newt Minion, MD;  Location: [place];  Service: Orthopedics;  Laterality: Right;  Right Above Knee Amputation    There were no vitals taken for this visit.  Visit Diagnosis:  Abnormality of gait  Activity intolerance  Status post above knee amputation of right lower extremity      Subjective Assessment - 01/01/14  [id number]    Symptoms No new complaints. Reports no falls or pain. Wearing prosthesis intermittently.    Currently in Pain? No/denies   Pain Score 0-No pain            [facility] Adult PT Treatment/Exercise - 01/01/14 0858    Transfers   Transfers Sit to Stand;Stand to Sit   Sit to Stand 4: Min guard;4: Min assist;With upper extremity assist;Without upper extremity assist;From bed;From chair/3-in-1  x 15 reps total   Sit to Stand Details (indicate cue type and reason) focued on prosthetic weight bearing and knee control with each rep                         Stand to Sit 4: Min guard;4: Min assist;With upper extremity assist;Without upper extremity assist;To bed;To chair/3-in-1  x 15  reps total   Stand to Sit Details focused on prosthetic weight bearing and knee control with each rep, use of arms needed to control descent with equal weight bearing on legs                 Ambulation/Gait   Ambulation/Gait Yes   Ambulation/Gait Assistance 4: Min assist;4: Min guard   Ambulation/Gait Assistance Details cues on posture, sequence, and prosthetic knee control with gait. Worked in paralle bars with steps broken down to emphasize knee control with toe off into heel strike with prosthesis. Worked on just stepping with sound side forward and back to practice unlocking and locking prosthetic knee.                         Ambulation Distance (Feet) 120 Feet  x1, 6 laps in //bars,   Assistive device Rolling walker;Parallel bars   Gait Pattern Step-to pattern;Step-through pattern;Decreased step length - right;Decreased stance time - right;Decreased step length - left;Decreased weight shift to right   Gait velocity decreased   Prosthetics   Current prosthetic wear tolerance (days/week)  4   Current prosthetic wear tolerance (#hours/day)  2 hours   Residual limb condition  in tact, no change in integumentary   Education Provided Residual limb care;Correct ply sock adjustment;Proper wear schedule/adjustment;Proper weight-bearing schedule/adjustment  prosthetic knee control with activity   Person(s) Educated Patient   Education Method Explanation;Demonstration;Verbal cues   Education Method Verbalized understanding;Needs further instruction;Verbal cues required   Donning Prosthesis Supervision;Minimal assist   Doffing Prosthesis Supervision;Minimal assist            PT Short Term Goals - 01/02/14 1337    PT SHORT TERM GOAL #1   Title Verbalize prpoper prosthetic care except cues to adjust the number of ply socks (Target Date: 01/17/14)   Baseline Baseline on 12/26/13: Patient recieved his first prosthesis on 12/25/13. He is totally dependent in knowledge of all aspects of care and  requires minimal assist to donne.   Time 4   Period Weeks   Status On-going   PT SHORT TERM GOAL #2   Title Tolerate wear of prosthesis >10 hours without change in skin integrity nor undue tenderness (Target Date: 01/17/14)   Baseline Baseline on 12/26/13: Patient recieved his first prosthesis on 12/25/13. Patient has not worn prosthesis outside of PT prior to 12/26/13 appt.   Time 4   Period Weeks   Status On-going   PT SHORT TERM GOAL #3   Title Ambulate 310ft with rolling walker and prosthesis modified  (Target Date: 01/17/14)   Baseline Baseline on 12/26/13: Patient  recieved his first prosthesis on 12/25/13. Patient ambulated 2' with walker & prosthesis with minimal physical assist and maximal verbal cues.   Time 4   Period Weeks   Status On-going   PT SHORT TERM GOAL #4   Title Negotiate ramp, curb, stairs (1 rail) with rolling walker and prosthesis modified indepent (Target Date: 01/17/14)   Baseline Baseline on 12/26/13: Patient recieved his first prosthesis on 12/25/13. Patient is currently dependent with negotiating barriers with prosthesis.   Time 4   Period Weeks   Status On-going   PT SHORT TERM GOAL #5   Status On-going          PT Long Term Goals - 01/02/14 1337    PT LONG TERM GOAL #1   Title Demonstrate and verbalize proper prosthetic care to enable safe utilization of the prosthesis (Target Date: 02/15/14)   Baseline Baseline on 12/26/13: Patient recieved his first prosthesis on 12/25/13. He is totally dependent in knowledge for all aspects of prosthetic care and requires minimal assist to donne.   Time 8   Period Weeks   Status On-going   PT LONG TERM GOAL #2   Title Tolerate wear of prosthesis for >90% of awake hours without change in skin integrity nor undue tenderness.  (Target Date: 02/15/14)   Baseline Baseline on 12/26/13: Patient recieved his first prosthesis on 12/25/13. Patient has not worn prosthesis prior to PT visit on 12/26/13.   Time 8   Period Weeks    Status On-going   PT LONG TERM GOAL #3   Title Ambulate 540ft with LRAD and prosthesis independently (Target Date: 02/15/14)   Baseline Baseline on 12/26/13: Patient recieved his first prosthesis on 12/25/13. Patient ambulates 4' with walker and prosthesis with minimal physical assist and maximal verbal cues.   Time 8   Period Weeks   Status On-going   PT LONG TERM GOAL #4   Title Ambulate 245ft on uneven (grass) surfaces with LRAD and prosthesis independently (Target Date: 02/15/14)   Baseline Baseline on 12/26/13: Patient recieved his first prosthesis on 12/25/13. Patient is unable to ambulate on uneven surfaces.   Time 8   Period Weeks   Status On-going   PT LONG TERM GOAL #5   Title Negotiate ramp, curb, stairs with LRAD and prosthesis independently (Target Date: 02/15/14)   Baseline Baseline on 12/26/13: Patient recieved his first prosthesis on 12/25/13. Patient is unable to negotiate barriers with prosthesis.   Time 8   Period Weeks   Status On-going   PT LONG TERM GOAL #6   Title Perform standing activities for >20 minutes with prosthesis with no pain or discomfort (Target Date: 02/15/14)   Baseline Baseline on 12/26/13: Patient recieved his first prosthesis on 12/25/13. Patient tolerates 5 minutes of standing with only partial weight on prosthesis using walker for support.   Time 8   Period Weeks   Status On-going   PT LONG TERM GOAL #7   Title Berg Balance test score with prosthesis >/= 45/56 (Target Date: 02/15/14)   Baseline Baseline on 12/26/13: Patient recieved his first prosthesis on 12/25/13. Berg Balance 19/56 on 12/26/13.   Time 8   Period Weeks   Status On-going          Plan - 01/02/14 1334    Clinical Impression Statement Pt with increased activity tolerance today with increased gait distance. Focused most of session on prosthetic knee control (locking/unlocking) with gait and transfers.   Pt will benefit from skilled therapeutic intervention in  order to improve on the  following deficits Abnormal gait;Difficulty walking;Decreased endurance;Decreased activity tolerance;Decreased balance;Decreased mobility;Decreased strength;Decreased knowledge of use of DME;Other (comment)  prosthetic dependency   Rehab Potential Good   PT Frequency 2x / week   PT Duration 8 weeks   PT Treatment/Interventions Therapeutic activities;ADLs/Self Care Home Management;Patient/family education;DME Instruction;Therapeutic exercise;Gait training;Balance training;Manual techniques;Stair training;Neuromuscular re-education;Energy conservation;Functional mobility training;Other (comment)  prosthetic training   PT Next Visit Plan Continue to work on prosthetick knee control with gait/transfers, issue sink HEP if not already done   Consulted and Agree with Plan of Care Patient            Problem List Patient Active Problem List   Diagnosis Date Noted  . Acute blood loss anemia 06/25/2013  . Hyperglycemia 06/07/2013  . Arm DVT (deep venous thromboembolism), acute 06/07/2013  . ESRD on dialysis 06/05/2013  . Encephalopathy, toxic 06/03/2013  . Cellulitis and abscess of leg 06/03/2013  . Anemia in chronic kidney disease 06/03/2013  . Arm edema 06/03/2013  . Leg abscess 06/03/2013  . Severe sepsis 06/03/2013  . Septic shock 06/03/2013  . Septic arthritis of ankle or foot, right 06/03/2013  . Abscess of right lower leg 06/02/2013  . Wrist lump 07/05/2012  . Swelling of limb-Left index and middle fingers 07/05/2012  . Aftercare following surgery of the circulatory system, [place] 07/05/2012  . Tingling-left wrist and some pain. 07/05/2012  . Pulmonary edema 05/04/2012  . Acute respiratory failure 05/04/2012  . CKD (chronic kidney disease) stage 5, GFR less than 15 ml/min 05/04/2012  . Hyperkalemia 05/04/2012  . Pre-operative cardiovascular examination 03/15/2012  . Iron deficiency anemia 09/22/2010  . HTN (hypertension) 09/22/2010  . Hyperlipidemia 09/22/2010  . Gout  09/22/2010  . CRI (chronic renal insufficiency) 09/22/2010  . Pulmonary embolism, bilateral 09/22/2010    Willow Ora 01/02/2014, 1:38 PM  Willow Ora, PTA, [place] [address], [place], [place] [address] [phone] 01/02/2014, 1:41 PM

## 2014-01-03 ENCOUNTER — Encounter: Payer: Self-pay | Admitting: Physical Therapy

## 2014-01-03 ENCOUNTER — Ambulatory Visit: Payer: BC Managed Care – PPO | Admitting: Physical Therapy

## 2014-01-03 DIAGNOSIS — Z89611 Acquired absence of right leg above knee: Secondary | ICD-10-CM | POA: Diagnosis not present

## 2014-01-03 DIAGNOSIS — R269 Unspecified abnormalities of gait and mobility: Secondary | ICD-10-CM

## 2014-01-03 DIAGNOSIS — R6889 Other general symptoms and signs: Secondary | ICD-10-CM

## 2014-01-03 NOTE — Therapy (Signed)
[facility] [address] [place], [place], [address] Phone: [phone]   Fax:  [phone]  Physical Therapy Treatment  Patient Details  Name: Steven Logan MRN: [id number] Date of Birth: 1970-11-01  Encounter Date: 01/03/2014      PT End of Session - 01/03/14 1935    Visit Number 4   Number of Visits 17   Date for PT Re-Evaluation 01/17/14   PT Start Time 0846   PT Stop Time 0930   PT Time Calculation (min) 44 min   Equipment Utilized During Treatment Gait belt   Activity Tolerance Patient tolerated treatment well   Behavior During Therapy [facility] for tasks assessed/performed      Past Medical History  Diagnosis Date  . Eczema   . Morbid obesity   . Gout   . HTN (hypertension)   . Dyslipidemia   . Pulmonary embolism     3  in  lungs  at  one time...  . Abscess     great toe  . Pancreatitis   . Arthritis   . Depression   . Diabetes mellitus   . Anemia   . Pneumonia     2-3 years ago  . Shortness of breath     ?? chest  cold he has now.  10/8- no SOB  . Renal hypertension     Hemo started  Sept 2014- MWF  . Chronic kidney disease     esrd    Past Surgical History  Procedure Laterality Date  . None    . Av fistula placement Left 04/07/2012    Procedure: ARTERIOVENOUS (AV) FISTULA CREATION;  Surgeon: Angelia Mould, MD;  Location: [place];  Service: Vascular;  Laterality: Left;  . Ligation of competing branches of arteriovenous fistula Left 09/19/2012    Procedure: LIGATION OF COMPETING BRANCHES OF LEFT ARM ARTERIOVENOUS FISTULA; Vein angioplasty;  Surgeon: Angelia Mould, MD;  Location: [place];  Service: Vascular;  Laterality: Left;  . Insertion of dialysis catheter Right 09/19/2012    Procedure: INSERTION OF DIALYSIS CATHETER;  Surgeon: Angelia Mould, MD;  Location: [place];  Service: Vascular;  Laterality: Right;  . I&d extremity Right 06/02/2013    Procedure: IRRIGATION AND DEBRIDEMENT  ARTHROSCOPIC RIGHT ANKLE;  Surgeon: Augustin Schooling, MD;  Location: [place];  Service: Orthopedics;  Laterality: Right;  . I&d extremity Right 06/04/2013    Procedure: IRRIGATION AND DEBRIDEMENT RIGHT FOOT/ANKLE;  Surgeon: Newt Minion, MD;  Location: [place];  Service: Orthopedics;  Laterality: Right;  . I&d extremity Right 06/06/2013    Procedure: IRRIGATION AND DEBRIDEMENT EXTREMITY;  Surgeon: Newt Minion, MD;  Location: [place];  Service: Orthopedics;  Laterality: Right;  . Amputation Right 06/12/2013    Procedure: Transtibial Amputation;  Surgeon: Newt Minion, MD;  Location: [place];  Service: Orthopedics;  Laterality: Right;  . I&d extremity Right 06/24/2013    Procedure: IRRIGATION AND DEBRIDEMENT and Revsion of TRANSTIBIAL AMPUTATION;  Surgeon: Newt Minion, MD;  Location: [place];  Service: Orthopedics;  Laterality: Right;  . Amputation Right 06/27/2013    Procedure: AMPUTATION ABOVE KNEE;  Surgeon: Newt Minion, MD;  Location: [place];  Service: Orthopedics;  Laterality: Right;  Right Above Knee Amputation  . Fistulogram Left 08/07/2012    Procedure: FISTULOGRAM;  Surgeon: Angelia Mould, MD;  Location: [facility] CATH [facility];  Service: Cardiovascular;  Laterality: Left;  arm    There were no vitals taken for this visit.  Visit Diagnosis:  Abnormality of gait  Activity intolerance  Status post above knee amputation of right lower extremity      Subjective Assessment - 01/03/14 0850    Symptoms No new complaints. No falls or pain.   Currently in Pain? No/denies   Pain Score 0-No pain            [facility] Adult PT Treatment/Exercise - 01/03/14 0851    Transfers   Sit to Stand 4: Min guard;With upper extremity assist;From chair/3-in-1   Sit to Stand Details (indicate cue type and reason) cues on technique and sequence to involve knee hydraulic                      Stand to Sit 4: Min guard;With upper extremity assist;To chair/3-in-1   Stand to Sit Details cues for equal weight bearing on  legs to engage knee hydrualic on prosthtesis   Ambulation/Gait   Ambulation/Gait Yes   Ambulation/Gait Assistance 4: Min guard;4: Min assist   Ambulation/Gait Assistance Details cues on posture, to increase left step length and for prosthetic  knee control with toe off to stance (for unlocking and locking) with gait.                   Ambulation Distance (Feet) 120 Feet  x  2 reps   Assistive device Rolling walker   Gait Pattern Step-through pattern;Decreased stride length;Narrow base of support;Trunk flexed   Gait velocity decreased   Prosthetics   Prosthetic Care Comments  Pt educated on sink HEP for proprioception and balance. Written instructions provided. Pt also noted to be 1/4 to 1/2 inch short on his prosthesis side. Call placed to [facility] with [place]. Gerald Stabs will be here at pt's next appt to make adjustments.                      Current prosthetic wear tolerance (days/week)  7   Current prosthetic wear tolerance (#hours/day)  2 hours 2x day  except on dialysis days-2 hours only   Residual limb condition  intact per patient   Education Provided Residual limb care;Correct ply sock adjustment;Proper wear schedule/adjustment;Proper weight-bearing schedule/adjustment   Person(s) Educated Patient   Education Method Explanation;Demonstration;Verbal cues   Education Method Verbalized understanding;Verbal cues required   Donning Prosthesis Minimal assist;Supervision   Doffing Prosthesis Minimal assist;Supervision          PT Education - 01/03/14 0905    Education provided Yes   Education Details HEP   Person(s) Educated Patient   Methods Explanation;Demonstration;Handout;Verbal cues   Comprehension Verbalized understanding;Verbal cues required          PT Short Term Goals - 01/03/14 1938    PT SHORT TERM GOAL #1   Title Verbalize prpoper prosthetic care except cues to adjust the number of ply socks (Target Date: 01/17/14)   Baseline Baseline on 12/26/13: Patient recieved his  first prosthesis on 12/25/13. He is totally dependent in knowledge of all aspects of care and requires minimal assist to donne.   Time 4   Period Weeks   Status On-going   PT SHORT TERM GOAL #2   Title Tolerate wear of prosthesis >10 hours without change in skin integrity nor undue tenderness (Target Date: 01/17/14)   Baseline Baseline on 12/26/13: Patient recieved his first prosthesis on 12/25/13. Patient has not worn prosthesis outside of PT prior to 12/26/13 appt.   Time 4   Period Weeks   Status On-going  PT SHORT TERM GOAL #3   Title Ambulate 335ft with rolling walker and prosthesis modified  (Target Date: 01/17/14)   Baseline Baseline on 12/26/13: Patient recieved his first prosthesis on 12/25/13. Patient ambulated 86' with walker & prosthesis with minimal physical assist and maximal verbal cues.   Time 4   Period Weeks   Status On-going   PT SHORT TERM GOAL #4   Title Negotiate ramp, curb, stairs (1 rail) with rolling walker and prosthesis modified indepent (Target Date: 01/17/14)   Baseline Baseline on 12/26/13: Patient recieved his first prosthesis on 12/25/13. Patient is currently dependent with negotiating barriers with prosthesis.   Time 4   Period Weeks   Status On-going   PT SHORT TERM GOAL #5   Status On-going          PT Long Term Goals - 01/03/14 1938    PT LONG TERM GOAL #1   Title Demonstrate and verbalize proper prosthetic care to enable safe utilization of the prosthesis (Target Date: 02/15/14)   Baseline Baseline on 12/26/13: Patient recieved his first prosthesis on 12/25/13. He is totally dependent in knowledge for all aspects of prosthetic care and requires minimal assist to donne.   Time 8   Period Weeks   Status On-going   PT LONG TERM GOAL #2   Title Tolerate wear of prosthesis for >90% of awake hours without change in skin integrity nor undue tenderness.  (Target Date: 02/15/14)   Baseline Baseline on 12/26/13: Patient recieved his first prosthesis on 12/25/13.  Patient has not worn prosthesis prior to PT visit on 12/26/13.   Time 8   Period Weeks   Status On-going   PT LONG TERM GOAL #3   Title Ambulate 548ft with LRAD and prosthesis independently (Target Date: 02/15/14)   Baseline Baseline on 12/26/13: Patient recieved his first prosthesis on 12/25/13. Patient ambulates 45' with walker and prosthesis with minimal physical assist and maximal verbal cues.   Time 8   Period Weeks   Status On-going   PT LONG TERM GOAL #4   Title Ambulate 288ft on uneven (grass) surfaces with LRAD and prosthesis independently (Target Date: 02/15/14)   Baseline Baseline on 12/26/13: Patient recieved his first prosthesis on 12/25/13. Patient is unable to ambulate on uneven surfaces.   Time 8   Period Weeks   Status On-going   PT LONG TERM GOAL #5   Title Negotiate ramp, curb, stairs with LRAD and prosthesis independently (Target Date: 02/15/14)   Baseline Baseline on 12/26/13: Patient recieved his first prosthesis on 12/25/13. Patient is unable to negotiate barriers with prosthesis.   Time 8   Period Weeks   Status On-going   PT LONG TERM GOAL #6   Title Perform standing activities for >20 minutes with prosthesis with no pain or discomfort (Target Date: 02/15/14)   Baseline Baseline on 12/26/13: Patient recieved his first prosthesis on 12/25/13. Patient tolerates 5 minutes of standing with only partial weight on prosthesis using walker for support.   Time 8   Period Weeks   Status On-going   PT LONG TERM GOAL #7   Title Berg Balance test score with prosthesis >/= 45/56 (Target Date: 02/15/14)   Baseline Baseline on 12/26/13: Patient recieved his first prosthesis on 12/25/13. Berg Balance 19/56 on 12/26/13.   Time 8   Period Weeks   Status On-going          Plan - 01/03/14 1936    Clinical Impression Statement Added HEP today for propriocpetion and  balance. Pt with more prosthetic knee control today. Did note pt is shorter on prosthetic side which could be a factor limiting  control with gait. Pt making good progress toward goals.                                         Pt will benefit from skilled therapeutic intervention in order to improve on the following deficits Abnormal gait;Difficulty walking;Decreased endurance;Decreased activity tolerance;Decreased balance;Decreased mobility;Decreased strength;Decreased knowledge of use of DME;Other (comment)  prosthetic dependency   Rehab Potential Good   PT Frequency 2x / week   PT Duration 8 weeks   PT Treatment/Interventions Therapeutic activities;ADLs/Self Care Home Management;Patient/family education;DME Instruction;Therapeutic exercise;Gait training;Balance training;Manual techniques;Stair training;Neuromuscular re-education;Energy conservation;Functional mobility training;Other (comment)  prosthetic training   PT Next Visit Plan Continue to work on prosthetick knee control with gait/transfers   PT Home Exercise Plan HEP at sink for midline.   Consulted and Agree with Plan of Care Patient         Problem List Patient Active Problem List   Diagnosis Date Noted  . Acute blood loss anemia 06/25/2013  . Hyperglycemia 06/07/2013  . Arm DVT (deep venous thromboembolism), acute 06/07/2013  . ESRD on dialysis 06/05/2013  . Encephalopathy, toxic 06/03/2013  . Cellulitis and abscess of leg 06/03/2013  . Anemia in chronic kidney disease 06/03/2013  . Arm edema 06/03/2013  . Leg abscess 06/03/2013  . Severe sepsis 06/03/2013  . Septic shock 06/03/2013  . Septic arthritis of ankle or foot, right 06/03/2013  . Abscess of right lower leg 06/02/2013  . Wrist lump 07/05/2012  . Swelling of limb-Left index and middle fingers 07/05/2012  . Aftercare following surgery of the circulatory system, [place] 07/05/2012  . Tingling-left wrist and some pain. 07/05/2012  . Pulmonary edema 05/04/2012  . Acute respiratory failure 05/04/2012  . CKD (chronic kidney disease) stage 5, GFR less than 15 ml/min 05/04/2012  . Hyperkalemia  05/04/2012  . Pre-operative cardiovascular examination 03/15/2012  . Iron deficiency anemia 09/22/2010  . HTN (hypertension) 09/22/2010  . Hyperlipidemia 09/22/2010  . Gout 09/22/2010  . CRI (chronic renal insufficiency) 09/22/2010  . Pulmonary embolism, bilateral 09/22/2010    Willow Ora 01/03/2014, 7:39 PM   Willow Ora, PTA, [place] [address] [place], [place] [address] [phone] 01/03/2014, 7:40 PM

## 2014-01-03 NOTE — Patient Instructions (Signed)
Do each exercise 1-2  times per day Do each exercise 10-15 repetitions Hold each exercise for 3-5 seconds to feel your location  AT SINK FIND YOUR MIDLINE POSITION AND PLACE FEET EQUAL DISTANCE FROM THE MIDLINE.  USE TAPE ON FLOOR TO MARK THE MIDLINE POSITION. You also should try to feel with your limb pressure in socket.  You are trying to feel with limb what you used to feel with the bottom of your foot.  1. Side to Side Shift: Moving your hips only (not shoulders): move weight onto your left leg, HOLD/FEEL.  Move back to equal weight on each leg, HOLD/FEEL. Move weight onto your right leg, HOLD/FEEL. Move back to equal weight on each leg, HOLD/FEEL. Repeat. 2. Front to Back Shift: Moving your hips only (not shoulders): move your weight forward onto your toes, HOLD/FEEL. Move your weight back to equal Flat Foot on both legs, HOLD/FEEL. Move your weight back onto your heels, HOLD/FEEL. Move your weight back to equal on both legs, HOLD/FEEL. Repeat. 3. Moving Cones / Cups: With equal weight on each leg: Hold on with one hand the first time, then progress to no hand supports. Move cups from one side of sink to the other. Place cups ~2" out of your reach, progress to 10" beyond reach. 4. Overhead/Upward Reaching: alternated reaching up to top cabinets or ceiling if no cabinets present. Keep equal weight on each leg. Start with one hand support on counter while other hand reaches and progress to no hand support with reaching. 5.   Looking Over Shoulders: With equal weight on each leg: alternate turning to look  over your shoulders with one hand support on counter as needed. Shift weight to             side looking, pull hip then shoulder then head/eyes around to look behind you. Start with one hand support & progress to no hand support. 

## 2014-01-08 ENCOUNTER — Ambulatory Visit: Payer: BC Managed Care – PPO | Admitting: Physical Therapy

## 2014-01-08 ENCOUNTER — Encounter: Payer: Self-pay | Admitting: Physical Therapy

## 2014-01-08 DIAGNOSIS — Z89611 Acquired absence of right leg above knee: Secondary | ICD-10-CM | POA: Diagnosis not present

## 2014-01-08 DIAGNOSIS — R6889 Other general symptoms and signs: Secondary | ICD-10-CM

## 2014-01-08 DIAGNOSIS — R269 Unspecified abnormalities of gait and mobility: Secondary | ICD-10-CM

## 2014-01-08 NOTE — Therapy (Signed)
[facility] [address] [place], [place], [address] Phone: [phone]   Fax:  [phone]  Physical Therapy Treatment  Patient Details  Name: Steven Logan MRN: [id number] Date of Birth: Apr 10, 1970  Encounter Date: 01/08/2014      PT End of Session - 01/08/14 0930    Visit Number 5   Number of Visits 17   Date for PT Re-Evaluation 01/17/14   PT Start Time 0930   PT Stop Time 1015   PT Time Calculation (min) 45 min   Equipment Utilized During Treatment Gait belt   Activity Tolerance Patient tolerated treatment well   Behavior During Therapy [facility] for tasks assessed/performed      Past Medical History  Diagnosis Date  . Eczema   . Morbid obesity   . Gout   . HTN (hypertension)   . Dyslipidemia   . Pulmonary embolism     3  in  lungs  at  one time...  . Abscess     great toe  . Pancreatitis   . Arthritis   . Depression   . Diabetes mellitus   . Anemia   . Pneumonia     2-3 years ago  . Shortness of breath     ?? chest  cold he has now.  10/8- no SOB  . Renal hypertension     Hemo started  Sept 2014- MWF  . Chronic kidney disease     esrd    Past Surgical History  Procedure Laterality Date  . None    . Av fistula placement Left 04/07/2012    Procedure: ARTERIOVENOUS (AV) FISTULA CREATION;  Surgeon: Angelia Mould, MD;  Location: [place];  Service: Vascular;  Laterality: Left;  . Ligation of competing branches of arteriovenous fistula Left 09/19/2012    Procedure: LIGATION OF COMPETING BRANCHES OF LEFT ARM ARTERIOVENOUS FISTULA; Vein angioplasty;  Surgeon: Angelia Mould, MD;  Location: [place];  Service: Vascular;  Laterality: Left;  . Insertion of dialysis catheter Right 09/19/2012    Procedure: INSERTION OF DIALYSIS CATHETER;  Surgeon: Angelia Mould, MD;  Location: [place];  Service: Vascular;  Laterality: Right;  . I&d extremity Right 06/02/2013    Procedure: IRRIGATION AND DEBRIDEMENT  ARTHROSCOPIC RIGHT ANKLE;  Surgeon: Augustin Schooling, MD;  Location: [place];  Service: Orthopedics;  Laterality: Right;  . I&d extremity Right 06/04/2013    Procedure: IRRIGATION AND DEBRIDEMENT RIGHT FOOT/ANKLE;  Surgeon: Newt Minion, MD;  Location: [place];  Service: Orthopedics;  Laterality: Right;  . I&d extremity Right 06/06/2013    Procedure: IRRIGATION AND DEBRIDEMENT EXTREMITY;  Surgeon: Newt Minion, MD;  Location: [place];  Service: Orthopedics;  Laterality: Right;  . Amputation Right 06/12/2013    Procedure: Transtibial Amputation;  Surgeon: Newt Minion, MD;  Location: [place];  Service: Orthopedics;  Laterality: Right;  . I&d extremity Right 06/24/2013    Procedure: IRRIGATION AND DEBRIDEMENT and Revsion of TRANSTIBIAL AMPUTATION;  Surgeon: Newt Minion, MD;  Location: [place];  Service: Orthopedics;  Laterality: Right;  . Amputation Right 06/27/2013    Procedure: AMPUTATION ABOVE KNEE;  Surgeon: Newt Minion, MD;  Location: [place];  Service: Orthopedics;  Laterality: Right;  Right Above Knee Amputation  . Fistulogram Left 08/07/2012    Procedure: FISTULOGRAM;  Surgeon: Angelia Mould, MD;  Location: [facility] CATH LAB;  Service: Cardiovascular;  Laterality: Left;  arm    There were no vitals taken for this visit.  Visit Diagnosis:  Abnormality of gait  Activity intolerance  Status post above knee amputation of right lower extremity      Subjective Assessment - 01/08/14 0936    Symptoms Wearing prosthesis 2 hrs/day twice on non-dialysis days & once on dialysis days.   Currently in Pain? No/denies            [facility] Adult PT Treatment/Exercise - 01/08/14 0930    Transfers   Transfers Sit to Stand;Stand to Sit   Sit to Stand 5: Supervision;With upper extremity assist;With armrests;From chair/3-in-1  to RW to stabalize   Stand to Sit 5: Supervision;With upper extremity assist;With armrests;To chair/3-in-1   Ambulation/Gait   Ambulation/Gait Yes   Ambulation/Gait Assistance 4:  Min guard   Ambulation/Gait Assistance Details tactile, verbal, demo terminal stance knee flexion / swing   Ambulation Distance (Feet) 150 Feet  5X   Assistive device Rolling walker   Gait Pattern Step-through pattern;Decreased stride length;Narrow base of support;Trunk flexed   Gait velocity decreased   Prosthetics   Current prosthetic wear tolerance (days/week)  7 days /wk   Current prosthetic wear tolerance (#hours/day)  2-3 hours 2x/day   Residual limb condition  intact per patient   Education Provided --  proper donning   Person(s) Educated Patient   Education Method Explanation   Education Method Verbalized understanding     Prosthetist present making changes in ht, knee rotation & foot toe-in as requested by PT. Patient to go to prosthetist office for pads in socket & program changes to knee.      PT Education - 01/08/14 1219    Education provided Yes   Education Details Seated 4-pt gait sequence exercise   Person(s) Educated Patient   Methods Explanation;Demonstration;Other (comment)  numbered cards   Comprehension Verbalized understanding;Returned demonstration          PT Short Term Goals - 01/08/14 0930    PT SHORT TERM GOAL #1   Title Verbalize prpoper prosthetic care except cues to adjust the number of ply socks (Target Date: 01/17/14)   Baseline Baseline on 12/26/13: Patient recieved his first prosthesis on 12/25/13. He is totally dependent in knowledge of all aspects of care and requires minimal assist to donne.   Time 4   Period Weeks   Status On-going   PT SHORT TERM GOAL #2   Title Tolerate wear of prosthesis >10 hours without change in skin integrity nor undue tenderness (Target Date: 01/17/14)   Baseline Baseline on 12/26/13: Patient recieved his first prosthesis on 12/25/13. Patient has not worn prosthesis outside of PT prior to 12/26/13 appt.   Time 4   Period Weeks   Status On-going   PT SHORT TERM GOAL #3   Title Ambulate 368ft with rolling walker and  prosthesis modified  (Target Date: 01/17/14)   Baseline Baseline on 12/26/13: Patient recieved his first prosthesis on 12/25/13. Patient ambulated 62' with walker & prosthesis with minimal physical assist and maximal verbal cues.   Time 4   Period Weeks   Status On-going   PT SHORT TERM GOAL #4   Title Negotiate ramp, curb, stairs (1 rail) with rolling walker and prosthesis modified indepent (Target Date: 01/17/14)   Baseline Baseline on 12/26/13: Patient recieved his first prosthesis on 12/25/13. Patient is currently dependent with negotiating barriers with prosthesis.   Time 4   Period Weeks   Status On-going   PT SHORT TERM GOAL #5   Status On-going  PT Long Term Goals - 01/08/14 0930    PT LONG TERM GOAL #1   Title Demonstrate and verbalize proper prosthetic care to enable safe utilization of the prosthesis (Target Date: 02/15/14)   Baseline Baseline on 12/26/13: Patient recieved his first prosthesis on 12/25/13. He is totally dependent in knowledge for all aspects of prosthetic care and requires minimal assist to donne.   Time 8   Period Weeks   Status On-going   PT LONG TERM GOAL #2   Title Tolerate wear of prosthesis for >90% of awake hours without change in skin integrity nor undue tenderness.  (Target Date: 02/15/14)   Baseline Baseline on 12/26/13: Patient recieved his first prosthesis on 12/25/13. Patient has not worn prosthesis prior to PT visit on 12/26/13.   Time 8   Period Weeks   Status On-going   PT LONG TERM GOAL #3   Title Ambulate 529ft with LRAD and prosthesis independently (Target Date: 02/15/14)   Baseline Baseline on 12/26/13: Patient recieved his first prosthesis on 12/25/13. Patient ambulates 33' with walker and prosthesis with minimal physical assist and maximal verbal cues.   Time 8   Period Weeks   Status On-going   PT LONG TERM GOAL #4   Title Ambulate 236ft on uneven (grass) surfaces with LRAD and prosthesis independently (Target Date: 02/15/14)    Baseline Baseline on 12/26/13: Patient recieved his first prosthesis on 12/25/13. Patient is unable to ambulate on uneven surfaces.   Time 8   Period Weeks   Status On-going   PT LONG TERM GOAL #5   Title Negotiate ramp, curb, stairs with LRAD and prosthesis independently (Target Date: 02/15/14)   Baseline Baseline on 12/26/13: Patient recieved his first prosthesis on 12/25/13. Patient is unable to negotiate barriers with prosthesis.   Time 8   Period Weeks   Status On-going   PT LONG TERM GOAL #6   Title Perform standing activities for >20 minutes with prosthesis with no pain or discomfort (Target Date: 02/15/14)   Baseline Baseline on 12/26/13: Patient recieved his first prosthesis on 12/25/13. Patient tolerates 5 minutes of standing with only partial weight on prosthesis using walker for support.   Time 8   Period Weeks   Status On-going   PT LONG TERM GOAL #7   Title Berg Balance test score with prosthesis >/= 45/56 (Target Date: 02/15/14)   Baseline Baseline on 12/26/13: Patient recieved his first prosthesis on 12/25/13. Berg Balance 19/56 on 12/26/13.   Time 8   Period Weeks   Status On-going          Plan - 01/08/14 0930    Clinical Impression Statement Patient improved gait with cues to unlock knee prior to initiating swing. Prosthetist present & made multiple changes that improved gait.   Pt will benefit from skilled therapeutic intervention in order to improve on the following deficits Abnormal gait;Difficulty walking;Decreased endurance;Decreased activity tolerance;Decreased balance;Decreased mobility;Decreased strength;Decreased knowledge of use of DME;Other (comment)  prosthetic dependency   Rehab Potential Good   PT Frequency 2x / week   PT Duration 8 weeks   PT Treatment/Interventions Therapeutic activities;ADLs/Self Care Home Management;Patient/family education;DME Instruction;Therapeutic exercise;Gait training;Balance training;Manual techniques;Stair training;Neuromuscular  re-education;Energy conservation;Functional mobility training;Other (comment)  prosthetic training   PT Next Visit Plan Continue to work on prosthetic knee control with gait/transfers   Consulted and Agree with Plan of Care Patient  Problem List Patient Active Problem List   Diagnosis Date Noted  . Acute blood loss anemia 06/25/2013  . Hyperglycemia 06/07/2013  . Arm DVT (deep venous thromboembolism), acute 06/07/2013  . ESRD on dialysis 06/05/2013  . Encephalopathy, toxic 06/03/2013  . Cellulitis and abscess of leg 06/03/2013  . Anemia in chronic kidney disease 06/03/2013  . Arm edema 06/03/2013  . Leg abscess 06/03/2013  . Severe sepsis 06/03/2013  . Septic shock 06/03/2013  . Septic arthritis of ankle or foot, right 06/03/2013  . Abscess of right lower leg 06/02/2013  . Wrist lump 07/05/2012  . Swelling of limb-Left index and middle fingers 07/05/2012  . Aftercare following surgery of the circulatory system, [place] 07/05/2012  . Tingling-left wrist and some pain. 07/05/2012  . Pulmonary edema 05/04/2012  . Acute respiratory failure 05/04/2012  . CKD (chronic kidney disease) stage 5, GFR less than 15 ml/min 05/04/2012  . Hyperkalemia 05/04/2012  . Pre-operative cardiovascular examination 03/15/2012  . Iron deficiency anemia 09/22/2010  . HTN (hypertension) 09/22/2010  . Hyperlipidemia 09/22/2010  . Gout 09/22/2010  . CRI (chronic renal insufficiency) 09/22/2010  . Pulmonary embolism, bilateral 09/22/2010    Yaresly Menzel 01/08/2014, 12:23 PM    Jamey Reas, PT, DPT PT Specializing in [place] 01/08/2014 12:25 PM Phone:  [phone]  Fax:  [phone] [facility] [address] [place], [place] [address]

## 2014-01-10 ENCOUNTER — Ambulatory Visit: Payer: BC Managed Care – PPO | Admitting: Physical Therapy

## 2014-01-10 ENCOUNTER — Encounter: Payer: Self-pay | Admitting: Physical Therapy

## 2014-01-10 DIAGNOSIS — R6889 Other general symptoms and signs: Secondary | ICD-10-CM

## 2014-01-10 DIAGNOSIS — R269 Unspecified abnormalities of gait and mobility: Secondary | ICD-10-CM

## 2014-01-10 DIAGNOSIS — Z89611 Acquired absence of right leg above knee: Secondary | ICD-10-CM

## 2014-01-10 NOTE — Therapy (Addendum)
[facility] [address] [place], [place], [address] Phone: [phone]   Fax:  [phone]  Physical Therapy Treatment  Patient Details  Name: Steven Logan MRN: [id number] Date of Birth: [birth year]  Encounter Date: 01/10/2014      PT End of Session - 01/10/14 1540    Visit Number 6   Number of Visits 17   Date for PT Re-Evaluation 01/17/14   PT Start Time [id number]   PT Stop Time 1525   PT Time Calculation (min) 40 min   Equipment Utilized During Treatment Gait belt   Activity Tolerance Patient tolerated treatment well   Behavior During Therapy [facility] for tasks assessed/performed      Past Medical History  Diagnosis Date  . Eczema   . Morbid obesity   . Gout   . HTN (hypertension)   . Dyslipidemia   . Pulmonary embolism     3  in  lungs  at  one time...  . Abscess     great toe  . Pancreatitis   . Arthritis   . Depression   . Diabetes mellitus   . Anemia   . Pneumonia     2-3 years ago  . Shortness of breath     ?? chest  cold he has now.  10/8- no SOB  . Renal hypertension     Hemo started  Sept 2014- MWF  . Chronic kidney disease     esrd    Past Surgical History  Procedure Laterality Date  . None    . Av fistula placement Left 04/07/2012    Procedure: ARTERIOVENOUS (AV) FISTULA CREATION;  Surgeon: Angelia Mould, MD;  Location: [place];  Service: Vascular;  Laterality: Left;  . Ligation of competing branches of arteriovenous fistula Left 09/19/2012    Procedure: LIGATION OF COMPETING BRANCHES OF LEFT ARM ARTERIOVENOUS FISTULA; Vein angioplasty;  Surgeon: Angelia Mould, MD;  Location: [place];  Service: Vascular;  Laterality: Left;  . Insertion of dialysis catheter Right 09/19/2012    Procedure: INSERTION OF DIALYSIS CATHETER;  Surgeon: Angelia Mould, MD;  Location: [place];  Service: Vascular;  Laterality: Right;  . I&d extremity Right 06/02/2013    Procedure: IRRIGATION AND DEBRIDEMENT  ARTHROSCOPIC RIGHT ANKLE;  Surgeon: Augustin Schooling, MD;  Location: [place];  Service: Orthopedics;  Laterality: Right;  . I&d extremity Right 06/04/2013    Procedure: IRRIGATION AND DEBRIDEMENT RIGHT FOOT/ANKLE;  Surgeon: Newt Minion, MD;  Location: [place];  Service: Orthopedics;  Laterality: Right;  . I&d extremity Right 06/06/2013    Procedure: IRRIGATION AND DEBRIDEMENT EXTREMITY;  Surgeon: Newt Minion, MD;  Location: [place];  Service: Orthopedics;  Laterality: Right;  . Amputation Right 06/12/2013    Procedure: Transtibial Amputation;  Surgeon: Newt Minion, MD;  Location: [place];  Service: Orthopedics;  Laterality: Right;  . I&d extremity Right 06/24/2013    Procedure: IRRIGATION AND DEBRIDEMENT and Revsion of TRANSTIBIAL AMPUTATION;  Surgeon: Newt Minion, MD;  Location: [place];  Service: Orthopedics;  Laterality: Right;  . Amputation Right 06/27/2013    Procedure: AMPUTATION ABOVE KNEE;  Surgeon: Newt Minion, MD;  Location: [place];  Service: Orthopedics;  Laterality: Right;  Right Above Knee Amputation  . Fistulogram Left 08/07/2012    Procedure: FISTULOGRAM;  Surgeon: Angelia Mould, MD;  Location: [facility] CATH LAB;  Service: Cardiovascular;  Laterality: Left;  arm    There were no vitals taken for this visit.  Visit Diagnosis:  Abnormality of gait  Activity intolerance  Status post above knee amputation of right lower extremity      Subjective Assessment - 01/10/14 1450    Symptoms Saw Gerald Stabs with [facility] today. See note for details. Reports he walked at home without AD this am at home, did not bend knee with gait thought, straight legged it.   Currently in Pain? No/denies   Pain Score 0-No pain            [facility] Adult PT Treatment/Exercise - 01/10/14 1456    Transfers   Sit to Stand 5: Supervision;With upper extremity assist;With armrests;From chair/3-in-1   Sit to Stand Details (indicate cue type and reason) cues on technique and prosthetic placement to involve hydraulic  knee   Stand to Sit 5: Supervision;With armrests;To chair/3-in-1;With upper extremity assist   Stand to Sit Details cues on foot placement to involve hydraulic with sitting down   Ambulation/Gait   Ambulation/Gait Yes   Ambulation/Gait Assistance 4: Min guard   Ambulation/Gait Assistance Details multimodal cues on prosthetic knee control, foot placement with intial contact, posture and walker position with gait.                           Ambulation Distance (Feet) 240 Feet  x 2, 120 x1   Assistive device Rolling walker   Gait Pattern Step-through pattern;Decreased stance time - right;Decreased stride length;Trunk flexed;Wide base of support   Gait velocity decreased   Prosthetics   Prosthetic Care Comments  Prosthetist added pads to socket and added a longer pin to the liner to assist with improved fit. Per pt prosthetist is considering a socket revision and was not able to adjust knee setting's because the knee was not fully charged. Prosthetist to call pt and reschedule for knee setting adjustments.                           Current prosthetic wear tolerance (days/week)  7 days /wk   Current prosthetic wear tolerance (#hours/day)  3 hours 2x day non dialysis days, 3 hours dialysis days  pt to increase to 4 hrs/2xday non dialysis, 4 hrs dialysis   Residual limb condition  intact per patient   Education Provided Proper wear schedule/adjustment;Residual limb care  donning/doffing   Person(s) Educated Patient   Education Method Explanation;Demonstration;Verbal cues   Education Method Verbalized understanding;Verbal cues required;Returned demonstration   Donning Prosthesis Minimal assist;Supervision   Doffing Prosthesis Minimal assist;Supervision            PT Short Term Goals - 01/10/14 1542    PT SHORT TERM GOAL #1   Title Verbalize prpoper prosthetic care except cues to adjust the number of ply socks (Target Date: 01/17/14)   Baseline Baseline on 12/26/13: Patient recieved his first  prosthesis on 12/25/13. He is totally dependent in knowledge of all aspects of care and requires minimal assist to donne.   Time 4   Period Weeks   Status On-going   PT SHORT TERM GOAL #2   Title Tolerate wear of prosthesis >10 hours without change in skin integrity nor undue tenderness (Target Date: 01/17/14)   Baseline Baseline on 12/26/13: Patient recieved his first prosthesis on 12/25/13. Patient has not worn prosthesis outside of PT prior to 12/26/13 appt.   Time 4   Period Weeks   Status On-going   PT SHORT TERM GOAL #3   Title Ambulate 318ft  with rolling walker and prosthesis modified  (Target Date: 01/17/14)   Baseline Baseline on 12/26/13: Patient recieved his first prosthesis on 12/25/13. Patient ambulated 46' with walker & prosthesis with minimal physical assist and maximal verbal cues.   Time 4   Period Weeks   Status On-going   PT SHORT TERM GOAL #4   Title Negotiate ramp, curb, stairs (1 rail) with rolling walker and prosthesis modified indepent (Target Date: 01/17/14)   Baseline Baseline on 12/26/13: Patient recieved his first prosthesis on 12/25/13. Patient is currently dependent with negotiating barriers with prosthesis.   Time 4   Period Weeks   Status On-going   PT SHORT TERM GOAL #5   Status On-going          PT Long Term Goals - 01/10/14 1542    PT LONG TERM GOAL #1   Title Demonstrate and verbalize proper prosthetic care to enable safe utilization of the prosthesis (Target Date: 02/15/14)   Baseline Baseline on 12/26/13: Patient recieved his first prosthesis on 12/25/13. He is totally dependent in knowledge for all aspects of prosthetic care and requires minimal assist to donne.   Time 8   Period Weeks   Status On-going   PT LONG TERM GOAL #2   Title Tolerate wear of prosthesis for >90% of awake hours without change in skin integrity nor undue tenderness.  (Target Date: 02/15/14)   Baseline Baseline on 12/26/13: Patient recieved his first prosthesis on 12/25/13. Patient  has not worn prosthesis prior to PT visit on 12/26/13.   Time 8   Period Weeks   Status On-going   PT LONG TERM GOAL #3   Title Ambulate 581ft with LRAD and prosthesis independently (Target Date: 02/15/14)   Baseline Baseline on 12/26/13: Patient recieved his first prosthesis on 12/25/13. Patient ambulates 37' with walker and prosthesis with minimal physical assist and maximal verbal cues.   Time 8   Period Weeks   Status On-going   PT LONG TERM GOAL #4   Title Ambulate 238ft on uneven (grass) surfaces with LRAD and prosthesis independently (Target Date: 02/15/14)   Baseline Baseline on 12/26/13: Patient recieved his first prosthesis on 12/25/13. Patient is unable to ambulate on uneven surfaces.   Time 8   Period Weeks   Status On-going   PT LONG TERM GOAL #5   Title Negotiate ramp, curb, stairs with LRAD and prosthesis independently (Target Date: 02/15/14)   Baseline Baseline on 12/26/13: Patient recieved his first prosthesis on 12/25/13. Patient is unable to negotiate barriers with prosthesis.   Time 8   Period Weeks   Status On-going   PT LONG TERM GOAL #6   Title Perform standing activities for >20 minutes with prosthesis with no pain or discomfort (Target Date: 02/15/14)   Baseline Baseline on 12/26/13: Patient recieved his first prosthesis on 12/25/13. Patient tolerates 5 minutes of standing with only partial weight on prosthesis using walker for support.   Time 8   Period Weeks   Status On-going   PT LONG TERM GOAL #7   Title Berg Balance test score with prosthesis >/= 45/56 (Target Date: 02/15/14)   Baseline Baseline on 12/26/13: Patient recieved his first prosthesis on 12/25/13. Berg Balance 19/56 on 12/26/13.   Time 8   Period Weeks   Status On-going          Plan - 01/10/14 1541    Clinical Impression Statement With repitition and practice pt was able to better control and demo proper knee hydrualic use  and foot placement with gait on last gait trial with only minimal cues needed.  Possible that the fact that the knee is not fully charged could be affecting it's movement with gait as well.  Progressing toward goals.   Pt will benefit from skilled therapeutic intervention in order to improve on the following deficits Abnormal gait;Difficulty walking;Decreased endurance;Decreased activity tolerance;Decreased balance;Decreased mobility;Decreased strength;Decreased knowledge of use of DME;Other (comment)  prosthetic dependency   Rehab Potential Good   PT Frequency 2x / week   PT Duration 8 weeks   PT Treatment/Interventions Therapeutic activities;ADLs/Self Care Home Management;Patient/family education;DME Instruction;Therapeutic exercise;Gait training;Balance training;Manual techniques;Stair training;Neuromuscular re-education;Energy conservation;Functional mobility training;Other (comment)  prosthetic training   PT Next Visit Plan Continue to work on prosthetic knee control with gait/transfers. Assess STG's.   PT Home Exercise Plan HEP at sink for midline.   Consulted and Agree with Plan of Care Patient        Problem List Patient Active Problem List   Diagnosis Date Noted  . Acute blood loss anemia 06/25/2013  . Hyperglycemia 06/07/2013  . Arm DVT (deep venous thromboembolism), acute 06/07/2013  . ESRD on dialysis 06/05/2013  . Encephalopathy, toxic 06/03/2013  . Cellulitis and abscess of leg 06/03/2013  . Anemia in chronic kidney disease 06/03/2013  . Arm edema 06/03/2013  . Leg abscess 06/03/2013  . Severe sepsis 06/03/2013  . Septic shock 06/03/2013  . Septic arthritis of ankle or foot, right 06/03/2013  . Abscess of right lower leg 06/02/2013  . Wrist lump 07/05/2012  . Swelling of limb-Left index and middle fingers 07/05/2012  . Aftercare following surgery of the circulatory system, [place] 07/05/2012  . Tingling-left wrist and some pain. 07/05/2012  . Pulmonary edema 05/04/2012  . Acute respiratory failure 05/04/2012  . CKD (chronic kidney disease) stage  5, GFR less than 15 ml/min 05/04/2012  . Hyperkalemia 05/04/2012  . Pre-operative cardiovascular examination 03/15/2012  . Iron deficiency anemia 09/22/2010  . HTN (hypertension) 09/22/2010  . Hyperlipidemia 09/22/2010  . Gout 09/22/2010  . CRI (chronic renal insufficiency) 09/22/2010  . Pulmonary embolism, bilateral 09/22/2010    Willow Ora 01/10/2014, 3:47 PM  Willow Ora, PTA, [facility] [address] [place], [place] [address] [phone] 01/10/2014, 3:48 PM

## 2014-01-15 ENCOUNTER — Encounter: Payer: Self-pay | Admitting: Physical Therapy

## 2014-01-15 ENCOUNTER — Ambulatory Visit: Payer: BC Managed Care – PPO | Admitting: Physical Therapy

## 2014-01-15 DIAGNOSIS — R269 Unspecified abnormalities of gait and mobility: Secondary | ICD-10-CM

## 2014-01-15 DIAGNOSIS — Z89611 Acquired absence of right leg above knee: Secondary | ICD-10-CM

## 2014-01-15 DIAGNOSIS — R6889 Other general symptoms and signs: Secondary | ICD-10-CM

## 2014-01-15 NOTE — Therapy (Signed)
[facility] [address] [place], [place], [address] Phone: [phone]   Fax:  [phone]  Physical Therapy Treatment  Patient Details  Name: Steven Logan MRN: [id number] Date of Birth: 1970/04/20  Encounter Date: 01/15/2014      PT End of Session - 01/15/14 0930    Visit Number 7   Number of Visits 17   Date for PT Re-Evaluation 02/15/13   PT Start Time 0930   PT Stop Time 1015   PT Time Calculation (min) 45 min   Equipment Utilized During Treatment Gait belt   Activity Tolerance Patient tolerated treatment well   Behavior During Therapy [facility] for tasks assessed/performed      Past Medical History  Diagnosis Date  . Eczema   . Morbid obesity   . Gout   . HTN (hypertension)   . Dyslipidemia   . Pulmonary embolism     3  in  lungs  at  one time...  . Abscess     great toe  . Pancreatitis   . Arthritis   . Depression   . Diabetes mellitus   . Anemia   . Pneumonia     2-3 years ago  . Shortness of breath     ?? chest  cold he has now.  10/8- no SOB  . Renal hypertension     Hemo started  Sept 2014- MWF  . Chronic kidney disease     esrd    Past Surgical History  Procedure Laterality Date  . None    . Av fistula placement Left 04/07/2012    Procedure: ARTERIOVENOUS (AV) FISTULA CREATION;  Surgeon: Angelia Mould, MD;  Location: [place];  Service: Vascular;  Laterality: Left;  . Ligation of competing branches of arteriovenous fistula Left 09/19/2012    Procedure: LIGATION OF COMPETING BRANCHES OF LEFT ARM ARTERIOVENOUS FISTULA; Vein angioplasty;  Surgeon: Angelia Mould, MD;  Location: [place];  Service: Vascular;  Laterality: Left;  . Insertion of dialysis catheter Right 09/19/2012    Procedure: INSERTION OF DIALYSIS CATHETER;  Surgeon: Angelia Mould, MD;  Location: [place];  Service: Vascular;  Laterality: Right;  . I&d extremity Right 06/02/2013    Procedure: IRRIGATION AND  DEBRIDEMENT ARTHROSCOPIC RIGHT ANKLE;  Surgeon: Augustin Schooling, MD;  Location: [place];  Service: Orthopedics;  Laterality: Right;  . I&d extremity Right 06/04/2013    Procedure: IRRIGATION AND DEBRIDEMENT RIGHT FOOT/ANKLE;  Surgeon: Newt Minion, MD;  Location: [place];  Service: Orthopedics;  Laterality: Right;  . I&d extremity Right 06/06/2013    Procedure: IRRIGATION AND DEBRIDEMENT EXTREMITY;  Surgeon: Newt Minion, MD;  Location: [place];  Service: Orthopedics;  Laterality: Right;  . Amputation Right 06/12/2013    Procedure: Transtibial Amputation;  Surgeon: Newt Minion, MD;  Location: [place];  Service: Orthopedics;  Laterality: Right;  . I&d extremity Right 06/24/2013    Procedure: IRRIGATION AND DEBRIDEMENT and Revsion of TRANSTIBIAL AMPUTATION;  Surgeon: Newt Minion, MD;  Location: [place];  Service: Orthopedics;  Laterality: Right;  . Amputation Right 06/27/2013    Procedure: AMPUTATION ABOVE KNEE;  Surgeon: Newt Minion, MD;  Location: [place];  Service: Orthopedics;  Laterality: Right;  Right Above Knee Amputation  . Fistulogram Left 08/07/2012    Procedure: FISTULOGRAM;  Surgeon: Angelia Mould, MD;  Location: [facility] CATH LAB;  Service: Cardiovascular;  Laterality: Left;  arm    There were no vitals taken for  this visit.  Visit Diagnosis:  Abnormality of gait  Activity intolerance  Status post above knee amputation of right lower extremity      Subjective Assessment - 01/15/14 0954    Symptoms Wearing prosthesis 4 hours 2x/day on non-dialysis days and 1x/day on dialysis days with no issues.   Currently in Pain? No/denies                    [facility] Adult PT Treatment/Exercise - 01/15/14 0930    Transfers   Sit to Stand 4: Min guard;With upper extremity assist;With armrests;From chair/3-in-1  to forearm crutches   Sit to Stand Details (indicate cue type and reason) PT demo /instructed prior   Stand to Sit 4: Min guard;With upper extremity assist;With armrests;To  chair/3-in-1  from forearm crutches   Stand to Sit Details PT demo /instructed prior to activity   Ambulation/Gait   Ambulation/Gait Yes   Ambulation/Gait Assistance 5: Supervision;4: Min assist  SBA RW, min A (2 people for safety) crutches   Ambulation/Gait Assistance Details demo / cues on bending prosthetic knee in terminal stance   Ambulation Distance (Feet) 320 Feet  320 with RW with SBA, 125' with crutches   Assistive device Rolling walker;Crutches   Gait Pattern Step-through pattern;Decreased stance time - right;Decreased stride length;Trunk flexed;Wide base of support   Gait velocity decreased   Ramp 5: Supervision  RW & prosthesis   Ramp Details (indicate cue type and reason) verbal cues on wt shift and posture /technique   Curb 5: Supervision  RW & prosthesis   Curb Details (indicate cue type and reason) verbal cues on sequence & knee control   Prosthetics   Current prosthetic wear tolerance (days/week)  7 days /wk   Current prosthetic wear tolerance (#hours/day)  PT advised 4 hours 2x/day even dialysis days if out of bed   Residual limb condition  intact per patient   Education Provided Skin check;Proper wear schedule/adjustment   Person(s) Educated Patient   Education Method Explanation   Education Method Verbalized understanding   Donning Prosthesis Supervision   Doffing Prosthesis Supervision                PT Education - 01/15/14 0930    Education provided Yes   Education Details increasing activity level within home with RW /prosthesis, prosthetic care   Person(s) Educated Patient   Methods Explanation   Comprehension Verbalized understanding          PT Short Term Goals - 01/15/14 0930    PT SHORT TERM GOAL #1   Title Verbalize prpoper prosthetic care except cues to adjust the number of ply socks (Target Date: 01/17/14)   Baseline MET 01/15/14   Time 4   Period Weeks   Status Achieved   PT SHORT TERM GOAL #2   Title Tolerate wear of  prosthesis >10 hours without change in skin integrity nor undue tenderness (Target Date: 01/17/14)   Baseline MET 01/15/14   Time 4   Period Weeks   Status Achieved   PT SHORT TERM GOAL #3   Title Ambulate 322f with rolling walker and prosthesis modified  (Target Date: 01/17/14)   Time 4   Period Weeks   Status On-going   PT SHORT TERM GOAL #4   Title Negotiate ramp, curb, stairs (1 rail) with rolling walker and prosthesis modified indepent (Target Date: 01/17/14)   Time 4   Period Weeks   Status On-going   PT SHORT TERM GOAL #5  Status On-going           PT Long Term Goals - 01/10/14 1542    PT LONG TERM GOAL #1   Title Demonstrate and verbalize proper prosthetic care to enable safe utilization of the prosthesis (Target Date: 02/15/14)   Baseline Baseline on 12/26/13: Patient recieved his first prosthesis on 12/25/13. He is totally dependent in knowledge for all aspects of prosthetic care and requires minimal assist to donne.   Time 8   Period Weeks   Status On-going   PT LONG TERM GOAL #2   Title Tolerate wear of prosthesis for >90% of awake hours without change in skin integrity nor undue tenderness.  (Target Date: 02/15/14)   Baseline Baseline on 12/26/13: Patient recieved his first prosthesis on 12/25/13. Patient has not worn prosthesis prior to PT visit on 12/26/13.   Time 8   Period Weeks   Status On-going   PT LONG TERM GOAL #3   Title Ambulate 53f with LRAD and prosthesis independently (Target Date: 02/15/14)   Baseline Baseline on 12/26/13: Patient recieved his first prosthesis on 12/25/13. Patient ambulates 536 with walker and prosthesis with minimal physical assist and maximal verbal cues.   Time 8   Period Weeks   Status On-going   PT LONG TERM GOAL #4   Title Ambulate 2050fon uneven (grass) surfaces with LRAD and prosthesis independently (Target Date: 02/15/14)   Baseline Baseline on 12/26/13: Patient recieved his first prosthesis on 12/25/13. Patient is unable to  ambulate on uneven surfaces.   Time 8   Period Weeks   Status On-going   PT LONG TERM GOAL #5   Title Negotiate ramp, curb, stairs with LRAD and prosthesis independently (Target Date: 02/15/14)   Baseline Baseline on 12/26/13: Patient recieved his first prosthesis on 12/25/13. Patient is unable to negotiate barriers with prosthesis.   Time 8   Period Weeks   Status On-going   PT LONG TERM GOAL #6   Title Perform standing activities for >20 minutes with prosthesis with no pain or discomfort (Target Date: 02/15/14)   Baseline Baseline on 12/26/13: Patient recieved his first prosthesis on 12/25/13. Patient tolerates 5 minutes of standing with only partial weight on prosthesis using walker for support.   Time 8   Period Weeks   Status On-going   PT LONG TERM GOAL #7   Title Berg Balance test score with prosthesis >/= 45/56 (Target Date: 02/15/14)   Baseline Baseline on 12/26/13: Patient recieved his first prosthesis on 12/25/13. Berg Balance 19/56 on 12/26/13.   Time 8   Period Weeks   Status On-going               Plan - 01/15/14 0930    Clinical Impression Statement Patient has good initial understanding for weighing /wearing prosthesis at dialysis. Patient did well for first time using forearm crutches with prosthesis.   Pt will benefit from skilled therapeutic intervention in order to improve on the following deficits Abnormal gait;Difficulty walking;Decreased endurance;Decreased activity tolerance;Decreased balance;Decreased mobility;Decreased strength;Decreased knowledge of use of DME;Other (comment)  prosthetic dependency   Rehab Potential Good   PT Frequency 2x / week   PT Duration 8 weeks   PT Treatment/Interventions Therapeutic activities;ADLs/Self Care Home Management;Patient/family education;DME Instruction;Therapeutic exercise;Gait training;Balance training;Manual techniques;Stair training;Neuromuscular re-education;Energy conservation;Functional mobility training;Other  (comment)  prosthetic training   PT Next Visit Plan Continue to work on prosthetic knee control with gait/transfers. Assess remaining STG's   Consulted and Agree with Plan of Care Patient  Problem List Patient Active Problem List   Diagnosis Date Noted  . Acute blood loss anemia 06/25/2013  . Hyperglycemia 06/07/2013  . Arm DVT (deep venous thromboembolism), acute 06/07/2013  . ESRD on dialysis 06/05/2013  . Encephalopathy, toxic 06/03/2013  . Cellulitis and abscess of leg 06/03/2013  . Anemia in chronic kidney disease 06/03/2013  . Arm edema 06/03/2013  . Leg abscess 06/03/2013  . Severe sepsis 06/03/2013  . Septic shock 06/03/2013  . Septic arthritis of ankle or foot, right 06/03/2013  . Abscess of right lower leg 06/02/2013  . Wrist lump 07/05/2012  . Swelling of limb-Left index and middle fingers 07/05/2012  . Aftercare following surgery of the circulatory system, [place] 07/05/2012  . Tingling-left wrist and some pain. 07/05/2012  . Pulmonary edema 05/04/2012  . Acute respiratory failure 05/04/2012  . CKD (chronic kidney disease) stage 5, GFR less than 15 ml/min 05/04/2012  . Hyperkalemia 05/04/2012  . Pre-operative cardiovascular examination 03/15/2012  . Iron deficiency anemia 09/22/2010  . HTN (hypertension) 09/22/2010  . Hyperlipidemia 09/22/2010  . Gout 09/22/2010  . CRI (chronic renal insufficiency) 09/22/2010  . Pulmonary embolism, bilateral 09/22/2010    Jamey Reas 01/15/2014, 4:16 PM  [facility] [address] [place], [place], [address] Phone: [phone]   Fax:  [phone]     Jamey Reas, PT, DPT PT Specializing in [place] 01/15/2014 4:17 PM Phone:  [phone]  Fax:  [phone] [facility] [address] [place], [place] [address]

## 2014-01-17 ENCOUNTER — Ambulatory Visit: Payer: BC Managed Care – PPO | Admitting: Physical Therapy

## 2014-01-17 ENCOUNTER — Encounter: Payer: Self-pay | Admitting: Physical Therapy

## 2014-01-17 DIAGNOSIS — R269 Unspecified abnormalities of gait and mobility: Secondary | ICD-10-CM

## 2014-01-17 DIAGNOSIS — Z89611 Acquired absence of right leg above knee: Secondary | ICD-10-CM

## 2014-01-17 DIAGNOSIS — R6889 Other general symptoms and signs: Secondary | ICD-10-CM

## 2014-01-17 NOTE — Therapy (Signed)
[facility] [address] [place], [place], [address] Phone: [phone]   Fax:  [phone]  Physical Therapy Treatment  Patient Details  Name: Steven Logan MRN: [id number] Date of Birth: 01-15-1971  Encounter Date: 01/17/2014      PT End of Session - 01/17/14 1205    Visit Number 8   Number of Visits 17   Date for PT Re-Evaluation 02/15/13   PT Start Time 0929   PT Stop Time 1010   PT Time Calculation (min) 41 min   Equipment Utilized During Treatment Gait belt   Activity Tolerance Patient tolerated treatment well   Behavior During Therapy [facility] for tasks assessed/performed      Past Medical History  Diagnosis Date  . Eczema   . Morbid obesity   . Gout   . HTN (hypertension)   . Dyslipidemia   . Pulmonary embolism     3  in  lungs  at  one time...  . Abscess     great toe  . Pancreatitis   . Arthritis   . Depression   . Diabetes mellitus   . Anemia   . Pneumonia     2-3 years ago  . Shortness of breath     ?? chest  cold he has now.  10/8- no SOB  . Renal hypertension     Hemo started  Sept 2014- MWF  . Chronic kidney disease     esrd    Past Surgical History  Procedure Laterality Date  . None    . Av fistula placement Left 04/07/2012    Procedure: ARTERIOVENOUS (AV) FISTULA CREATION;  Surgeon: Angelia Mould, MD;  Location: [place];  Service: Vascular;  Laterality: Left;  . Ligation of competing branches of arteriovenous fistula Left 09/19/2012    Procedure: LIGATION OF COMPETING BRANCHES OF LEFT ARM ARTERIOVENOUS FISTULA; Vein angioplasty;  Surgeon: Angelia Mould, MD;  Location: [place];  Service: Vascular;  Laterality: Left;  . Insertion of dialysis catheter Right 09/19/2012    Procedure: INSERTION OF DIALYSIS CATHETER;  Surgeon: Angelia Mould, MD;  Location: [place];  Service: Vascular;  Laterality: Right;  . I&d extremity Right 06/02/2013    Procedure: IRRIGATION AND  DEBRIDEMENT ARTHROSCOPIC RIGHT ANKLE;  Surgeon: Augustin Schooling, MD;  Location: [place];  Service: Orthopedics;  Laterality: Right;  . I&d extremity Right 06/04/2013    Procedure: IRRIGATION AND DEBRIDEMENT RIGHT FOOT/ANKLE;  Surgeon: Newt Minion, MD;  Location: [place];  Service: Orthopedics;  Laterality: Right;  . I&d extremity Right 06/06/2013    Procedure: IRRIGATION AND DEBRIDEMENT EXTREMITY;  Surgeon: Newt Minion, MD;  Location: [place];  Service: Orthopedics;  Laterality: Right;  . Amputation Right 06/12/2013    Procedure: Transtibial Amputation;  Surgeon: Newt Minion, MD;  Location: [place];  Service: Orthopedics;  Laterality: Right;  . I&d extremity Right 06/24/2013    Procedure: IRRIGATION AND DEBRIDEMENT and Revsion of TRANSTIBIAL AMPUTATION;  Surgeon: Newt Minion, MD;  Location: [place];  Service: Orthopedics;  Laterality: Right;  . Amputation Right 06/27/2013    Procedure: AMPUTATION ABOVE KNEE;  Surgeon: Newt Minion, MD;  Location: [place];  Service: Orthopedics;  Laterality: Right;  Right Above Knee Amputation  . Fistulogram Left 08/07/2012    Procedure: FISTULOGRAM;  Surgeon: Angelia Mould, MD;  Location: [facility] CATH LAB;  Service: Cardiovascular;  Laterality: Left;  arm    There were no vitals taken for  this visit.  Visit Diagnosis:  Abnormality of gait  Activity intolerance  Status post above knee amputation of right lower extremity      Subjective Assessment - 01/17/14 0930    Symptoms Feeling sluggish today. No pain or falls to report.   Currently in Pain? No/denies   Pain Score 0-No pain           [facility] Adult PT Treatment/Exercise - 01/17/14 0930    Transfers   Sit to Stand 4: Min guard;With upper extremity assist;From chair/3-in-1;5: Supervision;With armrests   Sit to Stand Details (indicate cue type and reason) cues for use of prosthesis   Stand to Sit 4: Min guard;5: Supervision;With upper extremity assist;To chair/3-in-1;With armrests   Stand to Sit Details  cues for use of prosthesis   Ambulation/Gait   Ambulation/Gait Yes   Ambulation/Gait Assistance 5: Supervision;4: Min guard   Ambulation/Gait Assistance Details intiatlly supervision with gait, however pt reporting increased dizziness as gait progressed and then provided min guard assist for safety until seated.   Ambulation Distance (Feet) 220 Feet   Assistive device Rolling walker   Gait Pattern Step-through pattern;Decreased stance time - right;Decreased stride length;Trunk flexed;Wide base of support   Gait velocity decreased   Ramp 5: Supervision  with RW/prosthesis   Ramp Details (indicate cue type and reason) cues on techique and posture with walker   Curb 5: Supervision  with RW/prosthesis   Curb Details (indicate cue type and reason) verbal cues on sequence   Knee/Hip Exercises: Standing   Other Standing Knee Exercises 4 way hip kicks with right stance and left leg kicks: green theraband resistance x10 reps each way with cues on posture, ex form and to decrease UE reliance on parallel bars.              Prosthetics   Current prosthetic wear tolerance (days/week)  7 days /wk   Current prosthetic wear tolerance (#hours/day)  4 hours 2x day   Residual limb condition  intact per patient   Education Provided Proper wear schedule/adjustment;Residual limb care   Person(s) Educated Patient   Education Method Explanation;Demonstration;Verbal cues   Education Method Verbalized understanding   Donning Prosthesis Supervision   Doffing Prosthesis Modified independent (device/increased time)            PT Short Term Goals - 01/17/14 1206    PT SHORT TERM GOAL #1   Title Verbalize prpoper prosthetic care except cues to adjust the number of ply socks (Target Date: 01/17/14)   Baseline MET 01/15/14   Time 4   Period Weeks   Status Achieved   PT SHORT TERM GOAL #2   Title Tolerate wear of prosthesis >10 hours without change in skin integrity nor undue tenderness (Target Date:  01/17/14)   Baseline MET 01/15/14   Time 4   Period Weeks   Status Achieved   PT SHORT TERM GOAL #3   Title Ambulate 394f with rolling walker and prosthesis modified  (Target Date: 01/17/14)   Time 4   Period Weeks   Status Not Met   PT SHORT TERM GOAL #4   Title Negotiate ramp, curb, stairs (1 rail) with rolling walker and prosthesis modified indepent (Target Date: 01/17/14)   Time 4   Period Weeks   Status Not Met   PT SHORT TERM GOAL #5   Status On-going           PT Long Term Goals - 01/10/14 1542    PT LONG TERM GOAL #1  Title Demonstrate and verbalize proper prosthetic care to enable safe utilization of the prosthesis (Target Date: 02/15/14)   Baseline Baseline on 12/26/13: Patient recieved his first prosthesis on 12/25/13. He is totally dependent in knowledge for all aspects of prosthetic care and requires minimal assist to donne.   Time 8   Period Weeks   Status On-going   PT LONG TERM GOAL #2   Title Tolerate wear of prosthesis for >90% of awake hours without change in skin integrity nor undue tenderness.  (Target Date: 02/15/14)   Baseline Baseline on 12/26/13: Patient recieved his first prosthesis on 12/25/13. Patient has not worn prosthesis prior to PT visit on 12/26/13.   Time 8   Period Weeks   Status On-going   PT LONG TERM GOAL #3   Title Ambulate 519f with LRAD and prosthesis independently (Target Date: 02/15/14)   Baseline Baseline on 12/26/13: Patient recieved his first prosthesis on 12/25/13. Patient ambulates 542 with walker and prosthesis with minimal physical assist and maximal verbal cues.   Time 8   Period Weeks   Status On-going   PT LONG TERM GOAL #4   Title Ambulate 2050fon uneven (grass) surfaces with LRAD and prosthesis independently (Target Date: 02/15/14)   Baseline Baseline on 12/26/13: Patient recieved his first prosthesis on 12/25/13. Patient is unable to ambulate on uneven surfaces.   Time 8   Period Weeks   Status On-going   PT LONG TERM  GOAL #5   Title Negotiate ramp, curb, stairs with LRAD and prosthesis independently (Target Date: 02/15/14)   Baseline Baseline on 12/26/13: Patient recieved his first prosthesis on 12/25/13. Patient is unable to negotiate barriers with prosthesis.   Time 8   Period Weeks   Status On-going   PT LONG TERM GOAL #6   Title Perform standing activities for >20 minutes with prosthesis with no pain or discomfort (Target Date: 02/15/14)   Baseline Baseline on 12/26/13: Patient recieved his first prosthesis on 12/25/13. Patient tolerates 5 minutes of standing with only partial weight on prosthesis using walker for support.   Time 8   Period Weeks   Status On-going   PT LONG TERM GOAL #7   Title Berg Balance test score with prosthesis >/= 45/56 (Target Date: 02/15/14)   Baseline Baseline on 12/26/13: Patient recieved his first prosthesis on 12/25/13. Berg Balance 19/56 on 12/26/13.   Time 8   Period Weeks   Status On-going            Plan - 01/17/14 1206    Pt will benefit from skilled therapeutic intervention in order to improve on the following deficits Abnormal gait;Difficulty walking;Decreased endurance;Decreased activity tolerance;Decreased balance;Decreased mobility;Decreased strength;Decreased knowledge of use of DME;Other (comment)  prosthetic dependency   Rehab Potential Good   PT Frequency 2x / week   PT Duration 8 weeks   PT Treatment/Interventions Therapeutic activities;ADLs/Self Care Home Management;Patient/family education;DME Instruction;Therapeutic exercise;Gait training;Balance training;Manual techniques;Stair training;Neuromuscular re-education;Energy conservation;Functional mobility training;Other (comment)  prosthetic training   PT Next Visit Plan Continue to work on prosthetic knee control with gait/transfers.    Consulted and Agree with Plan of Care Patient        Problem List Patient Active Problem List   Diagnosis Date Noted  . Acute blood loss anemia 06/25/2013  .  Hyperglycemia 06/07/2013  . Arm DVT (deep venous thromboembolism), acute 06/07/2013  . ESRD on dialysis 06/05/2013  . Encephalopathy, toxic 06/03/2013  . Cellulitis and abscess of leg 06/03/2013  . Anemia in chronic  kidney disease 06/03/2013  . Arm edema 06/03/2013  . Leg abscess 06/03/2013  . Severe sepsis 06/03/2013  . Septic shock 06/03/2013  . Septic arthritis of ankle or foot, right 06/03/2013  . Abscess of right lower leg 06/02/2013  . Wrist lump 07/05/2012  . Swelling of limb-Left index and middle fingers 07/05/2012  . Aftercare following surgery of the circulatory system, [place] 07/05/2012  . Tingling-left wrist and some pain. 07/05/2012  . Pulmonary edema 05/04/2012  . Acute respiratory failure 05/04/2012  . CKD (chronic kidney disease) stage 5, GFR less than 15 ml/min 05/04/2012  . Hyperkalemia 05/04/2012  . Pre-operative cardiovascular examination 03/15/2012  . Iron deficiency anemia 09/22/2010  . HTN (hypertension) 09/22/2010  . Hyperlipidemia 09/22/2010  . Gout 09/22/2010  . CRI (chronic renal insufficiency) 09/22/2010  . Pulmonary embolism, bilateral 09/22/2010    Willow Ora 01/17/2014, 12:07 PM  Willow Ora, PTA, [facility] [address] [place], [place] [address] [phone] 01/17/2014, 12:08 PM

## 2014-01-21 ENCOUNTER — Telehealth: Payer: Self-pay | Admitting: *Deleted

## 2014-01-22 ENCOUNTER — Ambulatory Visit: Payer: BC Managed Care – PPO | Admitting: Physical Therapy

## 2014-01-22 ENCOUNTER — Encounter: Payer: Self-pay | Admitting: Physical Therapy

## 2014-01-22 DIAGNOSIS — R6889 Other general symptoms and signs: Secondary | ICD-10-CM

## 2014-01-22 DIAGNOSIS — R269 Unspecified abnormalities of gait and mobility: Secondary | ICD-10-CM

## 2014-01-22 DIAGNOSIS — Z89611 Acquired absence of right leg above knee: Secondary | ICD-10-CM

## 2014-01-23 NOTE — Therapy (Signed)
[facility] [address] [place], [place], [address] Phone: [phone]   Fax:  [phone]  Physical Therapy Treatment  Patient Details  Name: Steven Logan MRN: [id number] Date of Birth: 09-May-1970  Encounter Date: 01/22/2014      PT End of Session - 01/22/14 0937    Visit Number 9   Number of Visits 17   Date for PT Re-Evaluation 02/15/13   PT Start Time 0931   PT Stop Time 1013   PT Time Calculation (min) 42 min   Equipment Utilized During Treatment Gait belt   Activity Tolerance Patient tolerated treatment well   Behavior During Therapy [facility] for tasks assessed/performed      Past Medical History  Diagnosis Date  . Eczema   . Morbid obesity   . Gout   . HTN (hypertension)   . Dyslipidemia   . Pulmonary embolism     3  in  lungs  at  one time...  . Abscess     great toe  . Pancreatitis   . Arthritis   . Depression   . Diabetes mellitus   . Anemia   . Pneumonia     2-3 years ago  . Shortness of breath     ?? chest  cold he has now.  10/8- no SOB  . Renal hypertension     Hemo started  Sept 2014- MWF  . Chronic kidney disease     esrd    Past Surgical History  Procedure Laterality Date  . None    . Av fistula placement Left 04/07/2012    Procedure: ARTERIOVENOUS (AV) FISTULA CREATION;  Surgeon: Angelia Mould, MD;  Location: [place];  Service: Vascular;  Laterality: Left;  . Ligation of competing branches of arteriovenous fistula Left 09/19/2012    Procedure: LIGATION OF COMPETING BRANCHES OF LEFT ARM ARTERIOVENOUS FISTULA; Vein angioplasty;  Surgeon: Angelia Mould, MD;  Location: [place];  Service: Vascular;  Laterality: Left;  . Insertion of dialysis catheter Right 09/19/2012    Procedure: INSERTION OF DIALYSIS CATHETER;  Surgeon: Angelia Mould, MD;  Location: [place];  Service: Vascular;  Laterality: Right;  . I&d extremity Right 06/02/2013    Procedure: IRRIGATION AND  DEBRIDEMENT ARTHROSCOPIC RIGHT ANKLE;  Surgeon: Augustin Schooling, MD;  Location: [place];  Service: Orthopedics;  Laterality: Right;  . I&d extremity Right 06/04/2013    Procedure: IRRIGATION AND DEBRIDEMENT RIGHT FOOT/ANKLE;  Surgeon: Newt Minion, MD;  Location: [place];  Service: Orthopedics;  Laterality: Right;  . I&d extremity Right 06/06/2013    Procedure: IRRIGATION AND DEBRIDEMENT EXTREMITY;  Surgeon: Newt Minion, MD;  Location: [place];  Service: Orthopedics;  Laterality: Right;  . Amputation Right 06/12/2013    Procedure: Transtibial Amputation;  Surgeon: Newt Minion, MD;  Location: [place];  Service: Orthopedics;  Laterality: Right;  . I&d extremity Right 06/24/2013    Procedure: IRRIGATION AND DEBRIDEMENT and Revsion of TRANSTIBIAL AMPUTATION;  Surgeon: Newt Minion, MD;  Location: [place];  Service: Orthopedics;  Laterality: Right;  . Amputation Right 06/27/2013    Procedure: AMPUTATION ABOVE KNEE;  Surgeon: Newt Minion, MD;  Location: [place];  Service: Orthopedics;  Laterality: Right;  Right Above Knee Amputation  . Fistulogram Left 08/07/2012    Procedure: FISTULOGRAM;  Surgeon: Angelia Mould, MD;  Location: [facility] CATH LAB;  Service: Cardiovascular;  Laterality: Left;  arm    There were no vitals taken for  this visit.  Visit Diagnosis:  Abnormality of gait  Activity intolerance  Status post above knee amputation of right lower extremity      Subjective Assessment - 01/22/14 0936    Symptoms No pain or falls to report. Feeling better today, thinks he was getting a cold with last visit. Went to brothers home for Christmas and reports his brother carried him up the 20 stairs.   Currently in Pain? No/denies   Pain Score 0-No pain           [facility] Adult PT Treatment/Exercise - 01/22/14 0938    Transfers   Sit to Stand 5: Supervision   Sit to Stand Details (indicate cue type and reason) cues to use/engage prosthetic knee   Stand to Sit 5: Supervision   Stand to Sit Details  cues to use/engage prosthetic knee   Ambulation/Gait   Ambulation/Gait Yes   Ambulation/Gait Assistance 5: Supervision;4: Min guard   Ambulation/Gait Assistance Details min guard assist to supervision with cues on posture, increased stride/step length. Pt with good knee control in stance and demo'd increased knee flexion with swing phase vs with previous sessions.                          Ambulation Distance (Feet) 220 Feet   Assistive device Rolling walker   Gait Pattern Step-through pattern;Decreased stance time - right;Decreased stride length;Trunk flexed;Wide base of support   Gait velocity decreased   Stairs Yes   Stairs Assistance 4: Min guard   Stair Management Technique Two rails;Step to pattern;Forwards   Number of Stairs 4  x 5 reps for 20 stairs total   Ramp --  min guard assist   Ramp Details (indicate cue type and reason) cues on technigue/short steps and to stay with walker   Curb --  min guard assist   Curb Details (indicate cue type and reason) cues to stay with walker with turning to go back up the curb   Prosthetics   Prosthetic Care Comments  Has not heard from prosthetist. Pt called today and left message for Gerald Stabs to call him back. Walking at home with RW except when cooking and going to mailbox, uses wheelchair with those.           Current prosthetic wear tolerance (days/week)  7 days /wk   Current prosthetic wear tolerance (#hours/day)  4 hours 2x day   Residual limb condition  intact per patient   Donning Prosthesis Modified independent (device/increased time)   Doffing Prosthesis Modified independent (device/increased time)           PT Short Term Goals - 01/23/14 0924    PT SHORT TERM GOAL #1   Title Verbalize prpoper prosthetic care except cues to adjust the number of ply socks (Target Date: 01/17/14)   Baseline MET 01/15/14   Time 4   Period Weeks   Status Achieved   PT SHORT TERM GOAL #2   Title Tolerate wear of prosthesis >10 hours without  change in skin integrity nor undue tenderness (Target Date: 01/17/14)   Baseline MET 01/15/14   Time 4   Period Weeks   Status Achieved   PT SHORT TERM GOAL #3   Title Ambulate 317f with rolling walker and prosthesis modified  (Target Date: 01/17/14)   Time 4   Period Weeks   Status Not Met   PT SHORT TERM GOAL #4   Title Negotiate ramp, curb, stairs (1 rail) with rolling  walker and prosthesis modified indepent (Target Date: 01/17/14)   Time 4   Period Weeks   Status Not Met   PT SHORT TERM GOAL #5   Status On-going           PT Long Term Goals - 01/10/14 1542    PT LONG TERM GOAL #1   Title Demonstrate and verbalize proper prosthetic care to enable safe utilization of the prosthesis (Target Date: 02/15/14)   Baseline Baseline on 12/26/13: Patient recieved his first prosthesis on 12/25/13. He is totally dependent in knowledge for all aspects of prosthetic care and requires minimal assist to donne.   Time 8   Period Weeks   Status On-going   PT LONG TERM GOAL #2   Title Tolerate wear of prosthesis for >90% of awake hours without change in skin integrity nor undue tenderness.  (Target Date: 02/15/14)   Baseline Baseline on 12/26/13: Patient recieved his first prosthesis on 12/25/13. Patient has not worn prosthesis prior to PT visit on 12/26/13.   Time 8   Period Weeks   Status On-going   PT LONG TERM GOAL #3   Title Ambulate 526f with LRAD and prosthesis independently (Target Date: 02/15/14)   Baseline Baseline on 12/26/13: Patient recieved his first prosthesis on 12/25/13. Patient ambulates 522 with walker and prosthesis with minimal physical assist and maximal verbal cues.   Time 8   Period Weeks   Status On-going   PT LONG TERM GOAL #4   Title Ambulate 2058fon uneven (grass) surfaces with LRAD and prosthesis independently (Target Date: 02/15/14)   Baseline Baseline on 12/26/13: Patient recieved his first prosthesis on 12/25/13. Patient is unable to ambulate on uneven surfaces.    Time 8   Period Weeks   Status On-going   PT LONG TERM GOAL #5   Title Negotiate ramp, curb, stairs with LRAD and prosthesis independently (Target Date: 02/15/14)   Baseline Baseline on 12/26/13: Patient recieved his first prosthesis on 12/25/13. Patient is unable to negotiate barriers with prosthesis.   Time 8   Period Weeks   Status On-going   PT LONG TERM GOAL #6   Title Perform standing activities for >20 minutes with prosthesis with no pain or discomfort (Target Date: 02/15/14)   Baseline Baseline on 12/26/13: Patient recieved his first prosthesis on 12/25/13. Patient tolerates 5 minutes of standing with only partial weight on prosthesis using walker for support.   Time 8   Period Weeks   Status On-going   PT LONG TERM GOAL #7   Title Berg Balance test score with prosthesis >/= 45/56 (Target Date: 02/15/14)   Baseline Baseline on 12/26/13: Patient recieved his first prosthesis on 12/25/13. Berg Balance 19/56 on 12/26/13.   Time 8   Period Weeks   Status On-going            Plan - 01/22/14 [id number]  Clinical Impression Statement Pt making steady progress with mobility and prosthetic training. Pt now more confident in stair mangement for when going to see his family with flight of stairs.    Pt will benefit from skilled therapeutic intervention in order to improve on the following deficits Abnormal gait;Difficulty walking;Decreased endurance;Decreased activity tolerance;Decreased balance;Decreased mobility;Decreased strength;Decreased knowledge of use of DME;Other (comment)  prosthetic dependency   Rehab Potential Good   PT Frequency 2x / week   PT Duration 8 weeks   PT Treatment/Interventions Therapeutic activities;ADLs/Self Care Home Management;Patient/family education;DME Instruction;Therapeutic exercise;Gait training;Balance training;Manual techniques;Stair training;Neuromuscular re-education;Energy conservation;Functional mobility training;Other (comment)  prosthetic training   PT  Next Visit Plan Continue to work on prosthetic knee control with gait/transfers.    Consulted and Agree with Plan of Care Patient        Problem List Patient Active Problem List   Diagnosis Date Noted  . Acute blood loss anemia 06/25/2013  . Hyperglycemia 06/07/2013  . Arm DVT (deep venous thromboembolism), acute 06/07/2013  . ESRD on dialysis 06/05/2013  . Encephalopathy, toxic 06/03/2013  . Cellulitis and abscess of leg 06/03/2013  . Anemia in chronic kidney disease 06/03/2013  . Arm edema 06/03/2013  . Leg abscess 06/03/2013  . Severe sepsis 06/03/2013  . Septic shock 06/03/2013  . Septic arthritis of ankle or foot, right 06/03/2013  . Abscess of right lower leg 06/02/2013  . Wrist lump 07/05/2012  . Swelling of limb-Left index and middle fingers 07/05/2012  . Aftercare following surgery of the circulatory system, [place] 07/05/2012  . Tingling-left wrist and some pain. 07/05/2012  . Pulmonary edema 05/04/2012  . Acute respiratory failure 05/04/2012  . CKD (chronic kidney disease) stage 5, GFR less than 15 ml/min 05/04/2012  . Hyperkalemia 05/04/2012  . Pre-operative cardiovascular examination 03/15/2012  . Iron deficiency anemia 09/22/2010  . HTN (hypertension) 09/22/2010  . Hyperlipidemia 09/22/2010  . Gout 09/22/2010  . CRI (chronic renal insufficiency) 09/22/2010  . Pulmonary embolism, bilateral 09/22/2010    Willow Ora 01/23/2014, 9:25 AM  Willow Ora, PTA, [facility] [address] [place], [place] [address] [phone] 01/23/2014, 9:25 AM

## 2014-01-29 ENCOUNTER — Ambulatory Visit: Payer: BLUE CROSS/BLUE SHIELD | Attending: Orthopedic Surgery | Admitting: Physical Therapy

## 2014-01-29 ENCOUNTER — Encounter: Payer: Self-pay | Admitting: Physical Therapy

## 2014-01-29 DIAGNOSIS — Z89611 Acquired absence of right leg above knee: Secondary | ICD-10-CM | POA: Diagnosis present

## 2014-01-29 DIAGNOSIS — R269 Unspecified abnormalities of gait and mobility: Secondary | ICD-10-CM | POA: Insufficient documentation

## 2014-01-29 DIAGNOSIS — R5381 Other malaise: Secondary | ICD-10-CM | POA: Diagnosis not present

## 2014-01-29 DIAGNOSIS — R6889 Other general symptoms and signs: Secondary | ICD-10-CM

## 2014-01-29 NOTE — Therapy (Signed)
[facility] [address] [place], [place], [address] Phone: [phone]   Fax:  [phone]  Physical Therapy Treatment  Patient Details  Name: Steven Logan MRN: [id number] Date of Birth: 08-17-1970  Encounter Date: 01/29/2014      PT End of Session - 01/29/14 1031    Visit Number 10   Number of Visits 17   Date for PT Re-Evaluation 02/15/13   PT Start Time 0935   PT Stop Time 1020   PT Time Calculation (min) 45 min   Equipment Utilized During Treatment Gait belt   Activity Tolerance Patient tolerated treatment well   Behavior During Therapy [facility] for tasks assessed/performed      Past Medical History  Diagnosis Date  . Eczema   . Morbid obesity   . Gout   . HTN (hypertension)   . Dyslipidemia   . Pulmonary embolism     3  in  lungs  at  one time...  . Abscess     great toe  . Pancreatitis   . Arthritis   . Depression   . Diabetes mellitus   . Anemia   . Pneumonia     2-3 years ago  . Shortness of breath     ?? chest  cold he has now.  10/8- no SOB  . Renal hypertension     Hemo started  Sept 2014- MWF  . Chronic kidney disease     esrd    Past Surgical History  Procedure Laterality Date  . None    . Av fistula placement Left 04/07/2012    Procedure: ARTERIOVENOUS (AV) FISTULA CREATION;  Surgeon: Angelia Mould, MD;  Location: [place];  Service: Vascular;  Laterality: Left;  . Ligation of competing branches of arteriovenous fistula Left 09/19/2012    Procedure: LIGATION OF COMPETING BRANCHES OF LEFT ARM ARTERIOVENOUS FISTULA; Vein angioplasty;  Surgeon: Angelia Mould, MD;  Location: [place];  Service: Vascular;  Laterality: Left;  . Insertion of dialysis catheter Right 09/19/2012    Procedure: INSERTION OF DIALYSIS CATHETER;  Surgeon: Angelia Mould, MD;  Location: [place];  Service: Vascular;  Laterality: Right;  . I&d extremity Right 06/02/2013    Procedure: IRRIGATION AND  DEBRIDEMENT ARTHROSCOPIC RIGHT ANKLE;  Surgeon: Augustin Schooling, MD;  Location: [place];  Service: Orthopedics;  Laterality: Right;  . I&d extremity Right 06/04/2013    Procedure: IRRIGATION AND DEBRIDEMENT RIGHT FOOT/ANKLE;  Surgeon: Newt Minion, MD;  Location: [place];  Service: Orthopedics;  Laterality: Right;  . I&d extremity Right 06/06/2013    Procedure: IRRIGATION AND DEBRIDEMENT EXTREMITY;  Surgeon: Newt Minion, MD;  Location: [place];  Service: Orthopedics;  Laterality: Right;  . Amputation Right 06/12/2013    Procedure: Transtibial Amputation;  Surgeon: Newt Minion, MD;  Location: [place];  Service: Orthopedics;  Laterality: Right;  . I&d extremity Right 06/24/2013    Procedure: IRRIGATION AND DEBRIDEMENT and Revsion of TRANSTIBIAL AMPUTATION;  Surgeon: Newt Minion, MD;  Location: [place];  Service: Orthopedics;  Laterality: Right;  . Amputation Right 06/27/2013    Procedure: AMPUTATION ABOVE KNEE;  Surgeon: Newt Minion, MD;  Location: [place];  Service: Orthopedics;  Laterality: Right;  Right Above Knee Amputation  . Fistulogram Left 08/07/2012    Procedure: FISTULOGRAM;  Surgeon: Angelia Mould, MD;  Location: [facility] CATH LAB;  Service: Cardiovascular;  Laterality: Left;  arm    There were no vitals taken for  this visit.  Visit Diagnosis:  Abnormality of gait  Activity intolerance  Status post above knee amputation of right lower extremity      Subjective Assessment - 01/29/14 0930    Symptoms wearing prosthesis 4 hours 2x/day without issues. He plans to weigh prosthesis at dialysis tomorrow.   Currently in Pain? No/denies                    [facility] Adult PT Treatment/Exercise - 01/29/14 0930    Transfers   Sit to Stand 5: Supervision;With armrests;From chair/3-in-1   Sit to Stand Details (indicate cue type and reason) cues on managing crutches   Stand to Sit 5: Supervision;With upper extremity assist;With armrests;To chair/3-in-1   Stand to Sit Details verbal cues  on crutch management.   Ambulation/Gait   Ambulation/Gait Yes   Ambulation/Gait Assistance 5: Supervision;4: Min guard   Ambulation/Gait Assistance Details verbal cues on posture, sequence & prosthesis clearance   Ambulation Distance (Feet) 220 Feet  2x   Assistive device Lofstrands   Gait Pattern Step-through pattern;Decreased stance time - right;Decreased stride length;Trunk flexed   Gait velocity decreased   Stairs Yes   Stairs Assistance 5: Supervision   Stairs Assistance Details (indicate cue type and reason) verbal cues on sequencing 1 crutch   Stair Management Technique Step to pattern;Forwards;One rail Right;One rail Left;With crutches  practiced with crutch in RUE & LUE   Number of Stairs 4  3 reps   Ramp 4: Min assist  forearm crutches with prosthesis   Ramp Details (indicate cue type and reason) verbal cues on sequence, step length & wt shift   Curb 4: Min assist  forearm crutches & prosthesis   Curb Details (indicate cue type and reason) verbal cues on sequence and technique   Dynamic Standing Balance   Dynamic Standing - Balance Support Right upper extremity supported   Dynamic Standing - Level of Assistance 5: Stand by assistance   Dynamic Standing - Balance Activities Reaching for objects  functional task reaching >10" to manage chair at table   Dynamic Standing - Comments verbal cues on crutch management   Prosthetics   Prosthetic Care Comments  Scheuduled to see prosthetist 1/12 at 10:00.    Current prosthetic wear tolerance (days/week)  7 days /wk   Current prosthetic wear tolerance (#hours/day)  4 hours 2x day  PT increased to 5hrs 2x/day with drying midway   Residual limb condition  intact per patient   Education Provided Proper wear schedule/adjustment;Correct ply sock adjustment  signs of sweat & drying limb/liner, Fall risk no prosthesis   Person(s) Educated Patient   Education Method Explanation   Education Method Verbalized understanding   Donning  Prosthesis Modified independent (device/increased time)   Doffing Prosthesis Modified independent (device/increased time)                PT Education - 01/29/14 0930    Education provided Yes   Education Details prosthetic care, posture with gait   Person(s) Educated Patient   Methods Explanation   Comprehension Verbalized understanding          PT Short Term Goals - 01/23/14 0924    PT SHORT TERM GOAL #1   Title Verbalize prpoper prosthetic care except cues to adjust the number of ply socks (Target Date: 01/17/14)   Baseline MET 01/15/14   Time 4   Period Weeks   Status Achieved   PT SHORT TERM GOAL #2   Title Tolerate wear of prosthesis >  10 hours without change in skin integrity nor undue tenderness (Target Date: 01/17/14)   Baseline MET 01/15/14   Time 4   Period Weeks   Status Achieved   PT SHORT TERM GOAL #3   Title Ambulate 377f with rolling walker and prosthesis modified  (Target Date: 01/17/14)   Time 4   Period Weeks   Status Not Met   PT SHORT TERM GOAL #4   Title Negotiate ramp, curb, stairs (1 rail) with rolling walker and prosthesis modified indepent (Target Date: 01/17/14)   Time 4   Period Weeks   Status Not Met   PT SHORT TERM GOAL #5   Status On-going           PT Long Term Goals - 01/10/14 1542    PT LONG TERM GOAL #1   Title Demonstrate and verbalize proper prosthetic care to enable safe utilization of the prosthesis (Target Date: 02/15/14)   Baseline Baseline on 12/26/13: Patient recieved his first prosthesis on 12/25/13. He is totally dependent in knowledge for all aspects of prosthetic care and requires minimal assist to donne.   Time 8   Period Weeks   Status On-going   PT LONG TERM GOAL #2   Title Tolerate wear of prosthesis for >90% of awake hours without change in skin integrity nor undue tenderness.  (Target Date: 02/15/14)   Baseline Baseline on 12/26/13: Patient recieved his first prosthesis on 12/25/13. Patient has not worn  prosthesis prior to PT visit on 12/26/13.   Time 8   Period Weeks   Status On-going   PT LONG TERM GOAL #3   Title Ambulate 5052fwith LRAD and prosthesis independently (Target Date: 02/15/14)   Baseline Baseline on 12/26/13: Patient recieved his first prosthesis on 12/25/13. Patient ambulates 5574with walker and prosthesis with minimal physical assist and maximal verbal cues.   Time 8   Period Weeks   Status On-going   PT LONG TERM GOAL #4   Title Ambulate 20084fn uneven (grass) surfaces with LRAD and prosthesis independently (Target Date: 02/15/14)   Baseline Baseline on 12/26/13: Patient recieved his first prosthesis on 12/25/13. Patient is unable to ambulate on uneven surfaces.   Time 8   Period Weeks   Status On-going   PT LONG TERM GOAL #5   Title Negotiate ramp, curb, stairs with LRAD and prosthesis independently (Target Date: 02/15/14)   Baseline Baseline on 12/26/13: Patient recieved his first prosthesis on 12/25/13. Patient is unable to negotiate barriers with prosthesis.   Time 8   Period Weeks   Status On-going   PT LONG TERM GOAL #6   Title Perform standing activities for >20 minutes with prosthesis with no pain or discomfort (Target Date: 02/15/14)   Baseline Baseline on 12/26/13: Patient recieved his first prosthesis on 12/25/13. Patient tolerates 5 minutes of standing with only partial weight on prosthesis using walker for support.   Time 8   Period Weeks   Status On-going   PT LONG TERM GOAL #7   Title Berg Balance test score with prosthesis >/= 45/56 (Target Date: 02/15/14)   Baseline Baseline on 12/26/13: Patient recieved his first prosthesis on 12/25/13. Berg Balance 19/56 on 12/26/13.   Time 8   Period Weeks   Status On-going               Plan - 01/29/14 0930    Clinical Impression Statement Patient appears close to using crutches with family /friend supervision outside of PT. He needs more  practice first especially on barriers.   Pt will benefit from skilled  therapeutic intervention in order to improve on the following deficits Abnormal gait;Difficulty walking;Decreased endurance;Decreased activity tolerance;Decreased balance;Decreased mobility;Decreased strength;Decreased knowledge of use of DME;Other (comment)  prosthetic dependency   Rehab Potential Good   PT Frequency 2x / week   PT Duration 8 weeks   PT Treatment/Interventions Therapeutic activities;ADLs/Self Care Home Management;Patient/family education;DME Instruction;Therapeutic exercise;Gait training;Balance training;Manual techniques;Stair training;Neuromuscular re-education;Energy conservation;Functional mobility training;Other (comment)  prosthetic training   PT Next Visit Plan Continue to work on prosthetic knee control with gait/transfers working with forearm crutches.   Consulted and Agree with Plan of Care Patient        Problem List Patient Active Problem List   Diagnosis Date Noted  . Acute blood loss anemia 06/25/2013  . Hyperglycemia 06/07/2013  . Arm DVT (deep venous thromboembolism), acute 06/07/2013  . ESRD on dialysis 06/05/2013  . Encephalopathy, toxic 06/03/2013  . Cellulitis and abscess of leg 06/03/2013  . Anemia in chronic kidney disease 06/03/2013  . Arm edema 06/03/2013  . Leg abscess 06/03/2013  . Severe sepsis 06/03/2013  . Septic shock 06/03/2013  . Septic arthritis of ankle or foot, right 06/03/2013  . Abscess of right lower leg 06/02/2013  . Wrist lump 07/05/2012  . Swelling of limb-Left index and middle fingers 07/05/2012  . Aftercare following surgery of the circulatory system, [place] 07/05/2012  . Tingling-left wrist and some pain. 07/05/2012  . Pulmonary edema 05/04/2012  . Acute respiratory failure 05/04/2012  . CKD (chronic kidney disease) stage 5, GFR less than 15 ml/min 05/04/2012  . Hyperkalemia 05/04/2012  . Pre-operative cardiovascular examination 03/15/2012  . Iron deficiency anemia 09/22/2010  . HTN (hypertension) 09/22/2010  .  Hyperlipidemia 09/22/2010  . Gout 09/22/2010  . CRI (chronic renal insufficiency) 09/22/2010  . Pulmonary embolism, bilateral 09/22/2010   Jamey Reas, PT, DPT PT Specializing in Prosthetics & Orthotics 01/29/2014 10:35 AM Phone:  [phone]  Fax:  [phone] [facility] [address] [place], [place] [address]  Jamey Reas 01/29/2014, 10:34 AM  [facility] [address] [place], [place], [address] Phone: [phone]   Fax:  [phone]

## 2014-01-31 ENCOUNTER — Ambulatory Visit: Payer: BLUE CROSS/BLUE SHIELD | Admitting: Physical Therapy

## 2014-01-31 ENCOUNTER — Encounter: Payer: Self-pay | Admitting: Physical Therapy

## 2014-01-31 DIAGNOSIS — R269 Unspecified abnormalities of gait and mobility: Secondary | ICD-10-CM

## 2014-01-31 DIAGNOSIS — R6889 Other general symptoms and signs: Secondary | ICD-10-CM

## 2014-01-31 DIAGNOSIS — Z89611 Acquired absence of right leg above knee: Secondary | ICD-10-CM

## 2014-01-31 NOTE — Therapy (Signed)
[facility] [address] [place], [place], [address] Phone: [phone]   Fax:  [phone]  Physical Therapy Treatment  Patient Details  Name: Steven Logan MRN: [id number] Date of Birth: 1970-11-04 Referring Provider:  Tivis Ringer, MD  Encounter Date: 01/31/2014      PT End of Session - 01/31/14 0944    Visit Number 11   Number of Visits 17   Date for PT Re-Evaluation 02/15/13   PT Start Time 0932   PT Stop Time 1014   PT Time Calculation (min) 42 min   Equipment Utilized During Treatment Gait belt   Activity Tolerance Patient tolerated treatment well   Behavior During Therapy [facility] for tasks assessed/performed      Past Medical History  Diagnosis Date  . Eczema   . Morbid obesity   . Gout   . HTN (hypertension)   . Dyslipidemia   . Pulmonary embolism     3  in  lungs  at  one time...  . Abscess     great toe  . Pancreatitis   . Arthritis   . Depression   . Diabetes mellitus   . Anemia   . Pneumonia     2-3 years ago  . Shortness of breath     ?? chest  cold he has now.  10/8- no SOB  . Renal hypertension     Hemo started  Sept 2014- MWF  . Chronic kidney disease     esrd    Past Surgical History  Procedure Laterality Date  . None    . Av fistula placement Left 04/07/2012    Procedure: ARTERIOVENOUS (AV) FISTULA CREATION;  Surgeon: Angelia Mould, MD;  Location: [place];  Service: Vascular;  Laterality: Left;  . Ligation of competing branches of arteriovenous fistula Left 09/19/2012    Procedure: LIGATION OF COMPETING BRANCHES OF LEFT ARM ARTERIOVENOUS FISTULA; Vein angioplasty;  Surgeon: Angelia Mould, MD;  Location: [place];  Service: Vascular;  Laterality: Left;  . Insertion of dialysis catheter Right 09/19/2012    Procedure: INSERTION OF DIALYSIS CATHETER;  Surgeon: Angelia Mould, MD;  Location: [place];  Service: Vascular;  Laterality: Right;  . I&d extremity Right  06/02/2013    Procedure: IRRIGATION AND DEBRIDEMENT ARTHROSCOPIC RIGHT ANKLE;  Surgeon: Augustin Schooling, MD;  Location: [place];  Service: Orthopedics;  Laterality: Right;  . I&d extremity Right 06/04/2013    Procedure: IRRIGATION AND DEBRIDEMENT RIGHT FOOT/ANKLE;  Surgeon: Newt Minion, MD;  Location: [place];  Service: Orthopedics;  Laterality: Right;  . I&d extremity Right 06/06/2013    Procedure: IRRIGATION AND DEBRIDEMENT EXTREMITY;  Surgeon: Newt Minion, MD;  Location: [place];  Service: Orthopedics;  Laterality: Right;  . Amputation Right 06/12/2013    Procedure: Transtibial Amputation;  Surgeon: Newt Minion, MD;  Location: [place];  Service: Orthopedics;  Laterality: Right;  . I&d extremity Right 06/24/2013    Procedure: IRRIGATION AND DEBRIDEMENT and Revsion of TRANSTIBIAL AMPUTATION;  Surgeon: Newt Minion, MD;  Location: [place];  Service: Orthopedics;  Laterality: Right;  . Amputation Right 06/27/2013    Procedure: AMPUTATION ABOVE KNEE;  Surgeon: Newt Minion, MD;  Location: [place];  Service: Orthopedics;  Laterality: Right;  Right Above Knee Amputation  . Fistulogram Left 08/07/2012    Procedure: FISTULOGRAM;  Surgeon: Angelia Mould, MD;  Location: [facility] CATH LAB;  Service: Cardiovascular;  Laterality: Left;  arm  There were no vitals taken for this visit.  Visit Diagnosis:  Abnormality of gait  Activity intolerance  Status post above knee amputation of right lower extremity      Subjective Assessment - 01/31/14 0937    Symptoms Wore prosthesis to dialysis yesterday and had it weighed. Sore today afer wearing prosthesis for 10 hours. No falls to reports.   Currently in Pain? Yes   Pain Score 3    Pain Location --  limb   Pain Orientation Right   Pain Type Chronic pain   Pain Onset Today   Pain Frequency Occasional   Aggravating Factors  increased prosthesis wear   Pain Relieving Factors tylenol          [facility] Adult PT Treatment/Exercise - 01/31/14 0939    Transfers    Sit to Stand 5: Supervision;With armrests;From chair/3-in-1;With upper extremity assist   Sit to Stand Details (indicate cue type and reason) cues on crutch management   Stand to Sit 5: Supervision;With upper extremity assist;With armrests;To chair/3-in-1   Stand to Sit Details cues on crutch management   Ambulation/Gait   Ambulation/Gait Yes   Ambulation/Gait Assistance 5: Supervision;4: Min guard   Ambulation/Gait Assistance Details verbal cues on sequence with gait with crutches and prosthesis. Cues on posture and to increase step/stride length.                     Ambulation Distance (Feet) 230 Feet  x1,    Assistive device Lofstrands   Gait Pattern Step-through pattern;Decreased stride length;Narrow base of support;Trunk flexed;Decreased stance time - right   Gait velocity decreased   Stairs Yes   Stairs Assistance 5: Supervision   Stairs Assistance Details (indicate cue type and reason) cues on sequence with one rail and one crutch.   Stair Management Technique Step to pattern;Forwards;One rail Right;One rail Left;With crutches  on crutch in left hand x1, right hand x1   Number of Stairs 4   Ramp 4: Min assist  with crutches and prosthesis   Ramp Details (indicate cue type and reason) cues on posture and sequence.   Curb 4: Min assist  with crutches and prosthesis   Curb Details (indicate cue type and reason) cues on sequence   Prosthetics   Prosthetic Care Comments  Scheuduled to see prosthetist 1/12 at 10:00.    Current prosthetic wear tolerance (days/week)  7 days /wk   Current prosthetic wear tolerance (#hours/day)  5 hours 2x day  drying midway   Residual limb condition  intact per patient   Education Provided Residual limb care;Correct ply sock adjustment;Proper wear schedule/adjustment   Person(s) Educated Patient   Education Method Explanation;Demonstration;Verbal cues   Education Method Verbalized understanding;Verbal cues required;Needs further instruction    Donning Prosthesis Modified independent (device/increased time)   Doffing Prosthesis Modified independent (device/increased time)           PT Short Term Goals - 01/23/14 0924    PT SHORT TERM GOAL #1   Title Verbalize prpoper prosthetic care except cues to adjust the number of ply socks (Target Date: 01/17/14)   Baseline MET 01/15/14   Time 4   Period Weeks   Status Achieved   PT SHORT TERM GOAL #2   Title Tolerate wear of prosthesis >10 hours without change in skin integrity nor undue tenderness (Target Date: 01/17/14)   Baseline MET 01/15/14   Time 4   Period Weeks   Status Achieved   PT SHORT TERM GOAL #3  Title Ambulate 317f with rolling walker and prosthesis modified  (Target Date: 01/17/14)   Time 4   Period Weeks   Status Not Met   PT SHORT TERM GOAL #4   Title Negotiate ramp, curb, stairs (1 rail) with rolling walker and prosthesis modified indepent (Target Date: 01/17/14)   Time 4   Period Weeks   Status Not Met   PT SHORT TERM GOAL #5   Status On-going           PT Long Term Goals - 01/10/14 1542    PT LONG TERM GOAL #1   Title Demonstrate and verbalize proper prosthetic care to enable safe utilization of the prosthesis (Target Date: 02/15/14)   Baseline Baseline on 12/26/13: Patient recieved his first prosthesis on 12/25/13. He is totally dependent in knowledge for all aspects of prosthetic care and requires minimal assist to donne.   Time 8   Period Weeks   Status On-going   PT LONG TERM GOAL #2   Title Tolerate wear of prosthesis for >90% of awake hours without change in skin integrity nor undue tenderness.  (Target Date: 02/15/14)   Baseline Baseline on 12/26/13: Patient recieved his first prosthesis on 12/25/13. Patient has not worn prosthesis prior to PT visit on 12/26/13.   Time 8   Period Weeks   Status On-going   PT LONG TERM GOAL #3   Title Ambulate 5076fwith LRAD and prosthesis independently (Target Date: 02/15/14)   Baseline Baseline on 12/26/13:  Patient recieved his first prosthesis on 12/25/13. Patient ambulates 5536with walker and prosthesis with minimal physical assist and maximal verbal cues.   Time 8   Period Weeks   Status On-going   PT LONG TERM GOAL #4   Title Ambulate 20060fn uneven (grass) surfaces with LRAD and prosthesis independently (Target Date: 02/15/14)   Baseline Baseline on 12/26/13: Patient recieved his first prosthesis on 12/25/13. Patient is unable to ambulate on uneven surfaces.   Time 8   Period Weeks   Status On-going   PT LONG TERM GOAL #5   Title Negotiate ramp, curb, stairs with LRAD and prosthesis independently (Target Date: 02/15/14)   Baseline Baseline on 12/26/13: Patient recieved his first prosthesis on 12/25/13. Patient is unable to negotiate barriers with prosthesis.   Time 8   Period Weeks   Status On-going   PT LONG TERM GOAL #6   Title Perform standing activities for >20 minutes with prosthesis with no pain or discomfort (Target Date: 02/15/14)   Baseline Baseline on 12/26/13: Patient recieved his first prosthesis on 12/25/13. Patient tolerates 5 minutes of standing with only partial weight on prosthesis using walker for support.   Time 8   Period Weeks   Status On-going   PT LONG TERM GOAL #7   Title Berg Balance test score with prosthesis >/= 45/56 (Target Date: 02/15/14)   Baseline Baseline on 12/26/13: Patient recieved his first prosthesis on 12/25/13. Berg Balance 19/56 on 12/26/13.   Time 8   Period Weeks   Status On-going           Plan - 01/31/14 0944    Clinical Impression Statement Pt continues to make progress toward goals and with mobility with crutches.   Pt will benefit from skilled therapeutic intervention in order to improve on the following deficits Abnormal gait;Difficulty walking;Decreased endurance;Decreased activity tolerance;Decreased balance;Decreased mobility;Decreased strength;Decreased knowledge of use of DME;Other (comment)  prosthetic dependency   Rehab Potential Good    PT Frequency 2x /  week   PT Duration 8 weeks   PT Treatment/Interventions Therapeutic activities;ADLs/Self Care Home Management;Patient/family education;DME Instruction;Therapeutic exercise;Gait training;Balance training;Manual techniques;Stair training;Neuromuscular re-education;Energy conservation;Functional mobility training;Other (comment)  prosthetic training   PT Next Visit Plan Continue to work on prosthetic knee control with gait/transfers working with forearm crutches.   Consulted and Agree with Plan of Care Patient        Problem List Patient Active Problem List   Diagnosis Date Noted  . Acute blood loss anemia 06/25/2013  . Hyperglycemia 06/07/2013  . Arm DVT (deep venous thromboembolism), acute 06/07/2013  . ESRD on dialysis 06/05/2013  . Encephalopathy, toxic 06/03/2013  . Cellulitis and abscess of leg 06/03/2013  . Anemia in chronic kidney disease 06/03/2013  . Arm edema 06/03/2013  . Leg abscess 06/03/2013  . Severe sepsis 06/03/2013  . Septic shock 06/03/2013  . Septic arthritis of ankle or foot, right 06/03/2013  . Abscess of right lower leg 06/02/2013  . Wrist lump 07/05/2012  . Swelling of limb-Left index and middle fingers 07/05/2012  . Aftercare following surgery of the circulatory system, [place] 07/05/2012  . Tingling-left wrist and some pain. 07/05/2012  . Pulmonary edema 05/04/2012  . Acute respiratory failure 05/04/2012  . CKD (chronic kidney disease) stage 5, GFR less than 15 ml/min 05/04/2012  . Hyperkalemia 05/04/2012  . Pre-operative cardiovascular examination 03/15/2012  . Iron deficiency anemia 09/22/2010  . HTN (hypertension) 09/22/2010  . Hyperlipidemia 09/22/2010  . Gout 09/22/2010  . CRI (chronic renal insufficiency) 09/22/2010  . Pulmonary embolism, bilateral 09/22/2010    Willow Ora 01/31/2014, 12:31 PM  Willow Ora, PTA, [place] [address], [place], [place] [address] [phone] 01/31/2014,  12:32 PM

## 2014-02-05 ENCOUNTER — Encounter: Payer: Self-pay | Admitting: Physical Therapy

## 2014-02-05 ENCOUNTER — Ambulatory Visit: Payer: BLUE CROSS/BLUE SHIELD | Admitting: Physical Therapy

## 2014-02-05 DIAGNOSIS — Z89611 Acquired absence of right leg above knee: Secondary | ICD-10-CM | POA: Diagnosis not present

## 2014-02-05 DIAGNOSIS — R6889 Other general symptoms and signs: Secondary | ICD-10-CM

## 2014-02-05 DIAGNOSIS — R269 Unspecified abnormalities of gait and mobility: Secondary | ICD-10-CM

## 2014-02-05 NOTE — Therapy (Signed)
[facility] [address] [place], [place], [address] Phone: [phone]   Fax:  [phone]  Physical Therapy Treatment  Patient Details  Name: Steven Logan MRN: [id number] Date of Birth: January 23, 1971 Referring Provider:  Tivis Ringer, MD  Encounter Date: 02/05/2014      PT End of Session - 02/05/14 0930    Visit Number 12   Number of Visits 17   Date for PT Re-Evaluation 02/15/13   PT Start Time 0930   PT Stop Time 1015   PT Time Calculation (min) 45 min   Equipment Utilized During Treatment Gait belt   Activity Tolerance Patient tolerated treatment well   Behavior During Therapy [facility] for tasks assessed/performed      Past Medical History  Diagnosis Date  . Eczema   . Morbid obesity   . Gout   . HTN (hypertension)   . Dyslipidemia   . Pulmonary embolism     3  in  lungs  at  one time...  . Abscess     great toe  . Pancreatitis   . Arthritis   . Depression   . Diabetes mellitus   . Anemia   . Pneumonia     2-3 years ago  . Shortness of breath     ?? chest  cold he has now.  10/8- no SOB  . Renal hypertension     Hemo started  Sept 2014- MWF  . Chronic kidney disease     esrd    Past Surgical History  Procedure Laterality Date  . None    . Av fistula placement Left 04/07/2012    Procedure: ARTERIOVENOUS (AV) FISTULA CREATION;  Surgeon: Angelia Mould, MD;  Location: [place];  Service: Vascular;  Laterality: Left;  . Ligation of competing branches of arteriovenous fistula Left 09/19/2012    Procedure: LIGATION OF COMPETING BRANCHES OF LEFT ARM ARTERIOVENOUS FISTULA; Vein angioplasty;  Surgeon: Angelia Mould, MD;  Location: [place];  Service: Vascular;  Laterality: Left;  . Insertion of dialysis catheter Right 09/19/2012    Procedure: INSERTION OF DIALYSIS CATHETER;  Surgeon: Angelia Mould, MD;  Location: [place];  Service: Vascular;  Laterality: Right;  . I&d extremity Right  06/02/2013    Procedure: IRRIGATION AND DEBRIDEMENT ARTHROSCOPIC RIGHT ANKLE;  Surgeon: Augustin Schooling, MD;  Location: [place];  Service: Orthopedics;  Laterality: Right;  . I&d extremity Right 06/04/2013    Procedure: IRRIGATION AND DEBRIDEMENT RIGHT FOOT/ANKLE;  Surgeon: Newt Minion, MD;  Location: [place];  Service: Orthopedics;  Laterality: Right;  . I&d extremity Right 06/06/2013    Procedure: IRRIGATION AND DEBRIDEMENT EXTREMITY;  Surgeon: Newt Minion, MD;  Location[place];  Service: Orthopedics;  Laterality: Right;  . Amputation Right 06/12/2013    Procedure: Transtibial Amputation;  Surgeon: Newt Minion, MD;  Location: [place];  Service: Orthopedics;  Laterality: Right;  . I&d extremity Right 06/24/2013    Procedure: IRRIGATION AND DEBRIDEMENT and Revsion of TRANSTIBIAL AMPUTATION;  Surgeon: Newt Minion, MD;  Location: [place];  Service: Orthopedics;  Laterality: Right;  . Amputation Right 06/27/2013    Procedure: AMPUTATION ABOVE KNEE;  Surgeon: Newt Minion, MD;  Location: [place];  Service: Orthopedics;  Laterality: Right;  Right Above Knee Amputation  . Fistulogram Left 08/07/2012    Procedure: FISTULOGRAM;  Surgeon: Angelia Mould, MD;  Location: [facility] CATH LAB;  Service: Cardiovascular;  Laterality: Left;  arm  There were no vitals taken for this visit.  Visit Diagnosis:  Abnormality of gait  Activity intolerance  Status post above knee amputation of right lower extremity      Subjective Assessment - 02/05/14 0939    Symptoms Wore prosthesis to dialysis with no issues.   Currently in Pain? No/denies                    [facility] Adult PT Treatment/Exercise - 02/05/14 0930    Transfers   Sit to Stand 5: Supervision;With armrests;From chair/3-in-1;With upper extremity assist   Stand to Sit 5: Supervision;With upper extremity assist;With armrests;To chair/3-in-1   Ambulation/Gait   Ambulation/Gait Yes   Ambulation/Gait Assistance 5: Supervision;4: Min guard;4: Min  assist   Ambulation/Gait Assistance Details PT cued sequence with 1 crutch   Ambulation/Gait Yes   Ambulation/Gait Assistance 5: Supervision;4: Min assist  SBA 2 crutches, Min A 1 crutch   Ambulation/Gait Assistance Details verbal cues on sequence, demo for 1 crutch   Ambulation Distance (Feet) 350 Feet  2 crutches 350 plus barriers, 250 with 1 crutch   Assistive device Lofstrands  1crutch   Gait Pattern Step-through pattern;Decreased stride length;Narrow base of support;Trunk flexed;Decreased stance time - right   Gait velocity decreased   Stairs Yes   Stairs Assistance 5: Supervision   Stairs Assistance Details (indicate cue type and reason) PT demo /instructed in loading hydraulics   Stair Management Technique Step to pattern;Forwards;One rail Right;One rail Left;With crutches  on crutch in left hand x1, right hand x1   Ramp 4: Min assist  with crutches and prosthesis   Curb 4: Min assist  with crutches and prosthesis   Prosthetics   Prosthetic Care Comments  Scheuduled to see prosthetist 1/12 at 10:00.    Current prosthetic wear tolerance (days/week)  7 days /wk   Current prosthetic wear tolerance (#hours/day)  5 hours 2x day  drying midway   Residual limb condition  intact per patient                PT Education - 02/05/14 0930    Education provided Yes   Education Details gait with 1 crutch   Person(s) Educated Patient   Methods Explanation   Comprehension Verbalized understanding          PT Short Term Goals - 01/23/14 0924    PT SHORT TERM GOAL #1   Title Verbalize prpoper prosthetic care except cues to adjust the number of ply socks (Target Date: 01/17/14)   Baseline MET 01/15/14   Time 4   Period Weeks   Status Achieved   PT SHORT TERM GOAL #2   Title Tolerate wear of prosthesis >10 hours without change in skin integrity nor undue tenderness (Target Date: 01/17/14)   Baseline MET 01/15/14   Time 4   Period Weeks   Status Achieved   PT SHORT TERM  GOAL #3   Title Ambulate 362ft with rolling walker and prosthesis modified  (Target Date: 01/17/14)   Time 4   Period Weeks   Status Not Met   PT SHORT TERM GOAL #4   Title Negotiate ramp, curb, stairs (1 rail) with rolling walker and prosthesis modified indepent (Target Date: 01/17/14)   Time 4   Period Weeks   Status Not Met   PT SHORT TERM GOAL #5   Status On-going           PT Long Term Goals - 01/10/14 1542    PT LONG TERM  GOAL #1   Title Demonstrate and verbalize proper prosthetic care to enable safe utilization of the prosthesis (Target Date: 02/15/14)   Baseline Baseline on 12/26/13: Patient recieved his first prosthesis on 12/25/13. He is totally dependent in knowledge for all aspects of prosthetic care and requires minimal assist to donne.   Time 8   Period Weeks   Status On-going   PT LONG TERM GOAL #2   Title Tolerate wear of prosthesis for >90% of awake hours without change in skin integrity nor undue tenderness.  (Target Date: 02/15/14)   Baseline Baseline on 12/26/13: Patient recieved his first prosthesis on 12/25/13. Patient has not worn prosthesis prior to PT visit on 12/26/13.   Time 8   Period Weeks   Status On-going   PT LONG TERM GOAL #3   Title Ambulate 561f with LRAD and prosthesis independently (Target Date: 02/15/14)   Baseline Baseline on 12/26/13: Patient recieved his first prosthesis on 12/25/13. Patient ambulates 560 with walker and prosthesis with minimal physical assist and maximal verbal cues.   Time 8   Period Weeks   Status On-going   PT LONG TERM GOAL #4   Title Ambulate 2016fon uneven (grass) surfaces with LRAD and prosthesis independently (Target Date: 02/15/14)   Baseline Baseline on 12/26/13: Patient recieved his first prosthesis on 12/25/13. Patient is unable to ambulate on uneven surfaces.   Time 8   Period Weeks   Status On-going   PT LONG TERM GOAL #5   Title Negotiate ramp, curb, stairs with LRAD and prosthesis independently (Target Date:  02/15/14)   Baseline Baseline on 12/26/13: Patient recieved his first prosthesis on 12/25/13. Patient is unable to negotiate barriers with prosthesis.   Time 8   Period Weeks   Status On-going   PT LONG TERM GOAL #6   Title Perform standing activities for >20 minutes with prosthesis with no pain or discomfort (Target Date: 02/15/14)   Baseline Baseline on 12/26/13: Patient recieved his first prosthesis on 12/25/13. Patient tolerates 5 minutes of standing with only partial weight on prosthesis using walker for support.   Time 8   Period Weeks   Status On-going   PT LONG TERM GOAL #7   Title Berg Balance test score with prosthesis >/= 45/56 (Target Date: 02/15/14)   Baseline Baseline on 12/26/13: Patient recieved his first prosthesis on 12/25/13. Berg Balance 19/56 on 12/26/13.   Time 8   Period Weeks   Status On-going               Plan - 02/05/14 0930    Clinical Impression Statement Patient did well with 1 crutch for first time trying.    Pt will benefit from skilled therapeutic intervention in order to improve on the following deficits Abnormal gait;Difficulty walking;Decreased endurance;Decreased activity tolerance;Decreased balance;Decreased mobility;Decreased strength;Decreased knowledge of use of DME;Other (comment)  prosthetic dependency   Rehab Potential Good   PT Frequency 2x / week   PT Duration 8 weeks   PT Treatment/Interventions Therapeutic activities;ADLs/Self Care Home Management;Patient/family education;DME Instruction;Therapeutic exercise;Gait training;Balance training;Manual techniques;Stair training;Neuromuscular re-education;Energy conservation;Functional mobility training;Other (comment)  prosthetic training   PT Next Visit Plan Continue to work on prosthetic knee control with gait/transfers working with forearm crutches.   Consulted and Agree with Plan of Care Patient        Problem List Patient Active Problem List   Diagnosis Date Noted  . Acute blood loss  anemia 06/25/2013  . Hyperglycemia 06/07/2013  . Arm DVT (deep venous thromboembolism),  acute 06/07/2013  . ESRD on dialysis 06/05/2013  . Encephalopathy, toxic 06/03/2013  . Cellulitis and abscess of leg 06/03/2013  . Anemia in chronic kidney disease 06/03/2013  . Arm edema 06/03/2013  . Leg abscess 06/03/2013  . Severe sepsis 06/03/2013  . Septic shock 06/03/2013  . Septic arthritis of ankle or foot, right 06/03/2013  . Abscess of right lower leg 06/02/2013  . Wrist lump 07/05/2012  . Swelling of limb-Left index and middle fingers 07/05/2012  . Aftercare following surgery of the circulatory system, [place] 07/05/2012  . Tingling-left wrist and some pain. 07/05/2012  . Pulmonary edema 05/04/2012  . Acute respiratory failure 05/04/2012  . CKD (chronic kidney disease) stage 5, GFR less than 15 ml/min 05/04/2012  . Hyperkalemia 05/04/2012  . Pre-operative cardiovascular examination 03/15/2012  . Iron deficiency anemia 09/22/2010  . HTN (hypertension) 09/22/2010  . Hyperlipidemia 09/22/2010  . Gout 09/22/2010  . CRI (chronic renal insufficiency) 09/22/2010  . Pulmonary embolism, bilateral 09/22/2010    Fallon Howerter 02/05/2014, 5:00 PM  [facility] [address] [place], [place], [address] Phone: [phone]   Fax:  [phone]   Jamey Reas, [place], DPT PT Specializing [facility] [place] 02/05/2014 5:01 PM Phone:  [phone]  Fax:  [phone] [facility] [address] [place], [place] [address]

## 2014-02-07 ENCOUNTER — Encounter (HOSPITAL_COMMUNITY): Payer: Self-pay | Admitting: Vascular Surgery

## 2014-02-07 ENCOUNTER — Ambulatory Visit: Payer: BLUE CROSS/BLUE SHIELD | Admitting: Physical Therapy

## 2014-02-07 DIAGNOSIS — Z89611 Acquired absence of right leg above knee: Secondary | ICD-10-CM

## 2014-02-07 DIAGNOSIS — R6889 Other general symptoms and signs: Secondary | ICD-10-CM

## 2014-02-07 DIAGNOSIS — R269 Unspecified abnormalities of gait and mobility: Secondary | ICD-10-CM

## 2014-02-07 NOTE — Therapy (Signed)
[facility] [address] [place], [place], [address] Phone: [phone]   Fax:  [phone]  Physical Therapy Treatment  Patient Details  Name: Steven Logan MRN: [id number] Date of Birth: 11-Apr-1970 Referring Provider:  Tivis Ringer, MD  Encounter Date: 02/07/2014      PT End of Session - 02/07/14 0812    Visit Number 13   Number of Visits 17   Date for PT Re-Evaluation 02/15/13   PT Start Time 0803   PT Stop Time 0843   PT Time Calculation (min) 40 min   Equipment Utilized During Treatment Gait belt   Activity Tolerance Patient tolerated treatment well   Behavior During Therapy [facility] for tasks assessed/performed      Past Medical History  Diagnosis Date  . Eczema   . Morbid obesity   . Gout   . HTN (hypertension)   . Dyslipidemia   . Pulmonary embolism     3  in  lungs  at  one time...  . Abscess     great toe  . Pancreatitis   . Arthritis   . Depression   . Diabetes mellitus   . Anemia   . Pneumonia     2-3 years ago  . Shortness of breath     ?? chest  cold he has now.  10/8- no SOB  . Renal hypertension     Hemo started  Sept 2014- MWF  . Chronic kidney disease     esrd    Past Surgical History  Procedure Laterality Date  . None    . Av fistula placement Left 04/07/2012    Procedure: ARTERIOVENOUS (AV) FISTULA CREATION;  Surgeon: Angelia Mould, MD;  Location: [place];  Service: Vascular;  Laterality: Left;  . Ligation of competing branches of arteriovenous fistula Left 09/19/2012    Procedure: LIGATION OF COMPETING BRANCHES OF LEFT ARM ARTERIOVENOUS FISTULA; Vein angioplasty;  Surgeon: Angelia Mould, MD;  Location: [place];  Service: Vascular;  Laterality: Left;  . Insertion of dialysis catheter Right 09/19/2012    Procedure: INSERTION OF DIALYSIS CATHETER;  Surgeon: Angelia Mould, MD;  Location: [place];  Service: Vascular;  Laterality: Right;  . I&d extremity Right  06/02/2013    Procedure: IRRIGATION AND DEBRIDEMENT ARTHROSCOPIC RIGHT ANKLE;  Surgeon: Augustin Schooling, MD;  Location: [employer];  Service: Orthopedics;  Laterality: Right;  . I&d extremity Right 06/04/2013    Procedure: IRRIGATION AND DEBRIDEMENT RIGHT FOOT/ANKLE;  Surgeon: Newt Minion, MD;  Location: [place];  Service: Orthopedics;  Laterality: Right;  . I&d extremity Right 06/06/2013    Procedure: IRRIGATION AND DEBRIDEMENT EXTREMITY;  Surgeon: Newt Minion, MD;  Location: [place];  Service: Orthopedics;  Laterality: Right;  . Amputation Right 06/12/2013    Procedure: Transtibial Amputation;  Surgeon: Newt Minion, MD;  Location: [place];  Service: Orthopedics;  Laterality: Right;  . I&d extremity Right 06/24/2013    Procedure: IRRIGATION AND DEBRIDEMENT and Revsion of TRANSTIBIAL AMPUTATION;  Surgeon: Newt Minion, MD;  Location: [place];  Service: Orthopedics;  Laterality: Right;  . Amputation Right 06/27/2013    Procedure: AMPUTATION ABOVE KNEE;  Surgeon: Newt Minion, MD;  Location: [place];  Service: Orthopedics;  Laterality: Right;  Right Above Knee Amputation  . Fistulogram Left 08/07/2012    Procedure: FISTULOGRAM;  Surgeon: Angelia Mould, MD;  Location: [facility];  Service: Cardiovascular;  Laterality: Left;  arm  There were no vitals taken for this visit.  Visit Diagnosis:  Abnormality of gait  Activity intolerance  Status post above knee amputation of right lower extremity      Subjective Assessment - 02/07/14 0808    Symptoms No new complaints. No falls to report. Increased pain yesterday after wearing prosthesis to dialysis, throbbing pain. Still hurting today.   Currently in Pain? Yes   Pain Score 5    Pain Location Other (Comment)  right limb   Pain Orientation Right   Pain Descriptors / Indicators Sore;Tender   Pain Type Other (Comment)  limb pain   Pain Onset Today   Pain Frequency Occasional   Aggravating Factors  increased prosthesis wear   Pain Relieving  Factors tylenol          [facility] Adult PT Treatment/Exercise - 02/07/14 0810    Transfers   Sit to Stand 5: Supervision;With armrests;From chair/3-in-1;With upper extremity assist   Sit to Stand Details (indicate cue type and reason) cues on crutch management with transfers   Stand to Sit 5: Supervision;With upper extremity assist;With armrests;To chair/3-in-1   Stand to Sit Details cues on crutch management   Ambulation/Gait   Ambulation/Gait Yes   Ambulation/Gait Assistance 4: Min guard;5: Supervision;4: Min assist  min assist with single crutch with LOB x1   Ambulation/Gait Assistance Details cues on posture, sequence, and for right prosthetic knee use with swing phase (vs circumduction).                         Ambulation Distance (Feet) 420 Feet  x1, 230 ft x1. 120 ft with single crutch x 2 laps   Assistive device Lofstrands;Prosthesis   Gait Pattern Step-through pattern;Decreased stride length;Decreased stance time - right;Trunk flexed   Ambulation Surface Level;Indoor   Gait velocity decreased   Ramp 4: Min assist  with crutches/prosthesis   Ramp Details (indicate cue type and reason) cues on posture and sequence   Curb 4: Min assist  with crutches/prosthesis   Curb Details (indicate cue type and reason) cues on sequence   Prosthetics   Prosthetic Care Comments  Pt went to [facility] (prosthetist) after last PT apt: adjustements made to socket and pt reports prosthetist said he may need a socket revision. Pt unable to state what adjustments were made or why he may need a socket revision.                      Current prosthetic wear tolerance (days/week)  7 days /wk   Current prosthetic wear tolerance (#hours/day)  all awake hours  drying throughout the day   Residual limb condition  intact per patient   Education Provided Correct ply sock adjustment;Proper wear schedule/adjustment   Person(s) Educated Patient   Education Method Explanation;Demonstration   Education Method  Verbalized understanding;Needs further instruction   Donning Prosthesis Modified independent (device/increased time)   Doffing Prosthesis Modified independent (device/increased time)     Stairs: with single crutch and prosthesis 4 with right rail/crutch other side, min guard assist with cues on sequence/techinique. 4 with left rail/crutch other side, min guard assist with cues on sequence/techinique.        PT Short Term Goals - 01/23/14 0924    PT SHORT TERM GOAL #1   Title Verbalize prpoper prosthetic care except cues to adjust the number of ply socks (Target Date: 01/17/14)   Baseline MET 01/15/14   Time 4   Period Weeks  Status Achieved   PT SHORT TERM GOAL #2   Title Tolerate wear of prosthesis >10 hours without change in skin integrity nor undue tenderness (Target Date: 01/17/14)   Baseline MET 01/15/14   Time 4   Period Weeks   Status Achieved   PT SHORT TERM GOAL #3   Title Ambulate 332f with rolling walker and prosthesis modified  (Target Date: 01/17/14)   Time 4   Period Weeks   Status Not Met   PT SHORT TERM GOAL #4   Title Negotiate ramp, curb, stairs (1 rail) with rolling walker and prosthesis modified indepent (Target Date: 01/17/14)   Time 4   Period Weeks   Status Not Met   PT SHORT TERM GOAL #5   Status On-going           PT Long Term Goals - 01/10/14 1542    PT LONG TERM GOAL #1   Title Demonstrate and verbalize proper prosthetic care to enable safe utilization of the prosthesis (Target Date: 02/15/14)   Baseline Baseline on 12/26/13: Patient recieved his first prosthesis on 12/25/13. He is totally dependent in knowledge for all aspects of prosthetic care and requires minimal assist to donne.   Time 8   Period Weeks   Status On-going   PT LONG TERM GOAL #2   Title Tolerate wear of prosthesis for >90% of awake hours without change in skin integrity nor undue tenderness.  (Target Date: 02/15/14)   Baseline Baseline on 12/26/13: Patient recieved his  first prosthesis on 12/25/13. Patient has not worn prosthesis prior to PT visit on 12/26/13.   Time 8   Period Weeks   Status On-going   PT LONG TERM GOAL #3   Title Ambulate 5038fwith LRAD and prosthesis independently (Target Date: 02/15/14)   Baseline Baseline on 12/26/13: Patient recieved his first prosthesis on 12/25/13. Patient ambulates 5566with walker and prosthesis with minimal physical assist and maximal verbal cues.   Time 8   Period Weeks   Status On-going   PT LONG TERM GOAL #4   Title Ambulate 2006fn uneven (grass) surfaces with LRAD and prosthesis independently (Target Date: 02/15/14)   Baseline Baseline on 12/26/13: Patient recieved his first prosthesis on 12/25/13. Patient is unable to ambulate on uneven surfaces.   Time 8   Period Weeks   Status On-going   PT LONG TERM GOAL #5   Title Negotiate ramp, curb, stairs with LRAD and prosthesis independently (Target Date: 02/15/14)   Baseline Baseline on 12/26/13: Patient recieved his first prosthesis on 12/25/13. Patient is unable to negotiate barriers with prosthesis.   Time 8   Period Weeks   Status On-going   PT LONG TERM GOAL #6   Title Perform standing activities for >20 minutes with prosthesis with no pain or discomfort (Target Date: 02/15/14)   Baseline Baseline on 12/26/13: Patient recieved his first prosthesis on 12/25/13. Patient tolerates 5 minutes of standing with only partial weight on prosthesis using walker for support.   Time 8   Period Weeks   Status On-going   PT LONG TERM GOAL #7   Title Berg Balance test score with prosthesis >/= 45/56 (Target Date: 02/15/14)   Baseline Baseline on 12/26/13: Patient recieved his first prosthesis on 12/25/13. Berg Balance 19/56 on 12/26/13.   Time 8   Period Weeks   Status On-going           Plan - 02/07/14 [id number] Clinical Impression Statement Pt continues to progress  with mobility. Progressing well with use of crutches, both bil and single crutch. Does continue to need cues  on prosthetic knee use/control.   Pt will benefit from skilled therapeutic intervention in order to improve on the following deficits Abnormal gait;Difficulty walking;Decreased endurance;Decreased activity tolerance;Decreased balance;Decreased mobility;Decreased strength;Decreased knowledge of use of DME;Other (comment)  prosthetic dependency   Rehab Potential Good   PT Frequency 2x / week   PT Duration 8 weeks   PT Treatment/Interventions Therapeutic activities;ADLs/Self Care Home Management;Patient/family education;DME Instruction;Therapeutic exercise;Gait training;Balance training;Manual techniques;Stair training;Neuromuscular re-education;Energy conservation;Functional mobility training;Other (comment)  prosthetic training   PT Next Visit Plan Continue to work on prosthetic knee control with gait/transfers working with forearm crutches.   Consulted and Agree with Plan of Care Patient        Problem List Patient Active Problem List   Diagnosis Date Noted  . Acute blood loss anemia 06/25/2013  . Hyperglycemia 06/07/2013  . Arm DVT (deep venous thromboembolism), acute 06/07/2013  . ESRD on dialysis 06/05/2013  . Encephalopathy, toxic 06/03/2013  . Cellulitis and abscess of leg 06/03/2013  . Anemia in chronic kidney disease 06/03/2013  . Arm edema 06/03/2013  . Leg abscess 06/03/2013  . Severe sepsis 06/03/2013  . Septic shock 06/03/2013  . Septic arthritis of ankle or foot, right 06/03/2013  . Abscess of right lower leg 06/02/2013  . Wrist lump 07/05/2012  . Swelling of limb-Left index and middle fingers 07/05/2012  . Aftercare following surgery of the circulatory system, [place] 07/05/2012  . Tingling-left wrist and some pain. 07/05/2012  . Pulmonary edema 05/04/2012  . Acute respiratory failure 05/04/2012  . CKD (chronic kidney disease) stage 5, GFR less than 15 ml/min 05/04/2012  . Hyperkalemia 05/04/2012  . Pre-operative cardiovascular examination 03/15/2012  . Iron  deficiency anemia 09/22/2010  . HTN (hypertension) 09/22/2010  . Hyperlipidemia 09/22/2010  . Gout 09/22/2010  . CRI (chronic renal insufficiency) 09/22/2010  . Pulmonary embolism, bilateral 09/22/2010    Willow Ora 02/07/2014, 12:07 PM  Willow Ora, PTA, [facility] [address], [place], [place] [address] [phone] 02/07/2014, 12:07 PM

## 2014-02-12 ENCOUNTER — Ambulatory Visit: Payer: BLUE CROSS/BLUE SHIELD | Admitting: Physical Therapy

## 2014-02-14 ENCOUNTER — Ambulatory Visit: Payer: BLUE CROSS/BLUE SHIELD | Admitting: Physical Therapy

## 2014-02-14 ENCOUNTER — Encounter: Payer: Self-pay | Admitting: Physical Therapy

## 2014-02-14 DIAGNOSIS — Z89611 Acquired absence of right leg above knee: Secondary | ICD-10-CM

## 2014-02-14 DIAGNOSIS — R6889 Other general symptoms and signs: Secondary | ICD-10-CM

## 2014-02-14 DIAGNOSIS — R269 Unspecified abnormalities of gait and mobility: Secondary | ICD-10-CM

## 2014-02-14 NOTE — Therapy (Signed)
[facility] [address] [place], [place], [address] Phone: [phone]   Fax:  [phone]  Physical Therapy Treatment  Patient Details  Name: Steven Logan MRN: [id number] Date of Birth: [birth year] Referring Provider:  Tivis Ringer, MD  Encounter Date: 02/14/2014      PT End of Session - 02/14/14 0936    Visit Number 14   Number of Visits 17   Date for PT Re-Evaluation 02/15/13   PT Start Time 0932   PT Stop Time 1014   PT Time Calculation (min) 42 min   Equipment Utilized During Treatment Gait belt   Activity Tolerance Patient tolerated treatment well   Behavior During Therapy [facility] for tasks assessed/performed      Past Medical History  Diagnosis Date  . Eczema   . Morbid obesity   . Gout   . HTN (hypertension)   . Dyslipidemia   . Pulmonary embolism     3  in  lungs  at  one time...  . Abscess     great toe  . Pancreatitis   . Arthritis   . Depression   . Diabetes mellitus   . Anemia   . Pneumonia     2-3 years ago  . Shortness of breath     ?? chest  cold he has now.  10/8- no SOB  . Renal hypertension     Hemo started  Sept 2014- MWF  . Chronic kidney disease     esrd    Past Surgical History  Procedure Laterality Date  . None    . Av fistula placement Left 04/07/2012    Procedure: ARTERIOVENOUS (AV) FISTULA CREATION;  Surgeon: Angelia Mould, MD;  Location: [place];  Service: Vascular;  Laterality: Left;  . Ligation of competing branches of arteriovenous fistula Left 09/19/2012    Procedure: LIGATION OF COMPETING BRANCHES OF LEFT ARM ARTERIOVENOUS FISTULA; Vein angioplasty;  Surgeon: Angelia Mould, MD;  Location: [place];  Service: Vascular;  Laterality: Left;  . Insertion of dialysis catheter Right 09/19/2012    Procedure: INSERTION OF DIALYSIS CATHETER;  Surgeon: Angelia Mould, MD;  Location: [place];  Service: Vascular;  Laterality: Right;  . I&d extremity Right  06/02/2013    Procedure: IRRIGATION AND DEBRIDEMENT ARTHROSCOPIC RIGHT ANKLE;  Surgeon: Augustin Schooling, MD;  Location: [place];  Service: Orthopedics;  Laterality: Right;  . I&d extremity Right 06/04/2013    Procedure: IRRIGATION AND DEBRIDEMENT RIGHT FOOT/ANKLE;  Surgeon: Newt Minion, MD;  Location: [place];  Service: Orthopedics;  Laterality: Right;  . I&d extremity Right 06/06/2013    Procedure: IRRIGATION AND DEBRIDEMENT EXTREMITY;  Surgeon: Newt Minion, MD;  Location: [place];  Service: Orthopedics;  Laterality: Right;  . Amputation Right 06/12/2013    Procedure: Transtibial Amputation;  Surgeon: Newt Minion, MD;  Location: [place];  Service: Orthopedics;  Laterality: Right;  . I&d extremity Right 06/24/2013    Procedure: IRRIGATION AND DEBRIDEMENT and Revsion of TRANSTIBIAL AMPUTATION;  Surgeon: Newt Minion, MD;  Location: [place];  Service: Orthopedics;  Laterality: Right;  . Amputation Right 06/27/2013    Procedure: AMPUTATION ABOVE KNEE;  Surgeon: Newt Minion, MD;  Location: [place];  Service: Orthopedics;  Laterality: Right;  Right Above Knee Amputation  . Fistulogram Left 08/07/2012    Procedure: FISTULOGRAM;  Surgeon: Angelia Mould, MD;  Location: [facility] CATH LAB;  Service: Cardiovascular;  Laterality: Left;  arm  There were no vitals taken for this visit.  Visit Diagnosis:  Abnormality of gait  Activity intolerance  Status post above knee amputation of right lower extremity      Subjective Assessment - 02/14/14 0935    Symptoms No new compaints. No falls or pain to report.    Currently in Pain? No/denies   Pain Score 0-No pain          [facility] Adult PT Treatment/Exercise - 02/14/14 0937    Ambulation/Gait   Ambulation/Gait Yes   Ambulation/Gait Assistance 5: Supervision;6: Modified independent (Device/Increase time)   Ambulation/Gait Assistance Details occasional reminder cue on posture   Ambulation Distance (Feet) 630 Feet  x1   Assistive device Rolling walker   Gait  Pattern Step-through pattern   Ambulation Surface Level;Indoor   Gait velocity WFL with RW   Ramp 6: Modified independent (Device)   Ramp Details (indicate cue type and reason) with prosthesis/RW   Curb 6: Modified independent (Device/increase time)   Curb Details (indicate cue type and reason) with prosthesis/RW   Berg Balance Test   Sit to Stand Able to stand without using hands and stabilize independently   Standing Unsupported Able to stand safely 2 minutes   Sitting with Back Unsupported but Feet Supported on Floor or Stool Able to sit safely and securely 2 minutes   Stand to Sit Controls descent by using hands   Transfers Able to transfer safely, definite need of hands   Standing Unsupported with Eyes Closed Able to stand 10 seconds with supervision   Standing Ubsupported with Feet Together Able to place feet together independently and stand for 1 minute with supervision   From Standing, Reach Forward with Outstretched Arm Can reach forward >12 cm safely (5")  8 inches   From Standing Position, Pick up Object from Floor Able to pick up shoe, needs supervision   From Standing Position, Turn to Look Behind Over each Shoulder Turn sideways only but maintains balance   Turn 360 Degrees Able to turn 360 degrees safely but slowly  > 6 sec's each way   Standing Unsupported, Alternately Place Feet on Step/Stool Able to complete >2 steps/needs minimal assist   Standing Unsupported, One Foot in Front Needs help to step but can hold 15 seconds   Standing on One Leg Able to lift leg independently and hold equal to or more than 3 seconds   Total Score 38   Prosthetics   Prosthetic Care Comments  Reports decreased pain since adjusting sock ply. Has not heard from prosthetist Thayer Ohm) as yet.                                                          Current prosthetic wear tolerance (days/week)  7 days /wk   Current prosthetic wear tolerance (#hours/day)  all awake hours  drying as needed throught  the day   Residual limb condition  intact per patient   Education Provided Correct ply sock adjustment;Proper wear schedule/adjustment;Residual limb care  drying more than 1 x a day   Person(s) Educated Patient   Education Method Explanation;Demonstration   Education Method Verbalized understanding;Verbal cues required;Needs further instruction   Donning Prosthesis Modified independent (device/increased time)   Doffing Prosthesis Modified independent (device/increased time)           PT  Short Term Goals - 01/23/14 0924    PT SHORT TERM GOAL #1   Title Verbalize prpoper prosthetic care except cues to adjust the number of ply socks (Target Date: 01/17/14)   Baseline MET 01/15/14   Time 4   Period Weeks   Status Achieved   PT SHORT TERM GOAL #2   Title Tolerate wear of prosthesis >10 hours without change in skin integrity nor undue tenderness (Target Date: 01/17/14)   Baseline MET 01/15/14   Time 4   Period Weeks   Status Achieved   PT SHORT TERM GOAL #3   Title Ambulate 381ft with rolling walker and prosthesis modified  (Target Date: 01/17/14)   Time 4   Period Weeks   Status Not Met   PT SHORT TERM GOAL #4   Title Negotiate ramp, curb, stairs (1 rail) with rolling walker and prosthesis modified indepent (Target Date: 01/17/14)   Time 4   Period Weeks   Status Not Met   PT SHORT TERM GOAL #5   Status On-going           PT Long Term Goals - 02/14/14 1256    PT LONG TERM GOAL #1   Title Demonstrate and verbalize proper prosthetic care to enable safe utilization of the prosthesis (Target Date: 02/15/14)   Baseline 02/14/2014: Pt independent with cleaning and wear of prosthesis. Still needing cues on sock management and knee use/control.   Time 8   Period Weeks   Status Not Met   PT LONG TERM GOAL #2   Title Tolerate wear of prosthesis for >90% of awake hours without change in skin integrity nor undue tenderness.  (Target Date: 02/15/14)   Baseline 02/14/2014: Goal met.    Time 8   Period Weeks   Status Achieved   PT LONG TERM GOAL #3   Title Ambulate 570ft with LRAD and prosthesis independently (Target Date: 02/15/14)   Baseline 02/14/2014: Goal met at RW level.   Time 8   Period Weeks   Status Achieved   PT LONG TERM GOAL #4   Title Ambulate 233ft on uneven (grass) surfaces with LRAD and prosthesis independently (Target Date: 02/15/14)   Baseline Baseline on 12/26/13: Patient recieved his first prosthesis on 12/25/13. Patient is unable to ambulate on uneven surfaces.   Time 8   Period Weeks   Status Unable to assess  icy ground conditions outside.   PT LONG TERM GOAL #5   Title Negotiate ramp, curb, stairs with LRAD and prosthesis independently (Target Date: 02/15/14)   Baseline 02/14/2014: mostly met at RW level. Still needs cues at time placing him at supervision level with mobility.   Time 8   Period Weeks   Status Partially Met   PT LONG TERM GOAL #6   Title Perform standing activities for >20 minutes with prosthesis with no pain or discomfort (Target Date: 02/15/14)   Baseline 02/14/2014: met.   Time 8   Period Weeks   Status Achieved   PT LONG TERM GOAL #7   Title Berg Balance test score with prosthesis >/= 45/56 (Target Date: 02/15/14)   Baseline 02/15/2104: Berg score was 38/56.   Time 8   Period Weeks   Status Not Met           Plan - 02/14/14 0936    Clinical Impression Statement LTG's checked today with most mobility goals met at RW level. PT to do renewal.   Pt will benefit from skilled therapeutic intervention in order to  improve on the following deficits Abnormal gait;Difficulty walking;Decreased endurance;Decreased activity tolerance;Decreased balance;Decreased mobility;Decreased strength;Decreased knowledge of use of DME;Other (comment)  prosthetic dependency   Rehab Potential Good   PT Frequency 2x / week   PT Duration 8 weeks   PT Treatment/Interventions Therapeutic activities;ADLs/Self Care Home Management;Patient/family  education;DME Instruction;Therapeutic exercise;Gait training;Balance training;Manual techniques;Stair training;Neuromuscular re-education;Energy conservation;Functional mobility training;Other (comment)  prosthetic training   PT Next Visit Plan Continue to work on prosthetic knee control with gait/transfers working with forearm crutches.   Consulted and Agree with Plan of Care Patient        Problem List Patient Active Problem List   Diagnosis Date Noted  . Acute blood loss anemia 06/25/2013  . Hyperglycemia 06/07/2013  . Arm DVT (deep venous thromboembolism), acute 06/07/2013  . ESRD on dialysis 06/05/2013  . Encephalopathy, toxic 06/03/2013  . Cellulitis and abscess of leg 06/03/2013  . Anemia in chronic kidney disease 06/03/2013  . Arm edema 06/03/2013  . Leg abscess 06/03/2013  . Severe sepsis 06/03/2013  . Septic shock 06/03/2013  . Septic arthritis of ankle or foot, right 06/03/2013  . Abscess of right lower leg 06/02/2013  . Wrist lump 07/05/2012  . Swelling of limb-Left index and middle fingers 07/05/2012  . Aftercare following surgery of the circulatory system, [place] 07/05/2012  . Tingling-left wrist and some pain. 07/05/2012  . Pulmonary edema 05/04/2012  . Acute respiratory failure 05/04/2012  . CKD (chronic kidney disease) stage 5, GFR less than 15 ml/min 05/04/2012  . Hyperkalemia 05/04/2012  . Pre-operative cardiovascular examination 03/15/2012  . Iron deficiency anemia 09/22/2010  . HTN (hypertension) 09/22/2010  . Hyperlipidemia 09/22/2010  . Gout 09/22/2010  . CRI (chronic renal insufficiency) 09/22/2010  . Pulmonary embolism, bilateral 09/22/2010    Willow Ora 02/14/2014, 1:03 PM  Willow Ora, PTA, [facility] [address] [place], [place] [address] [phone] 02/14/2014, 1:03 PM

## 2014-02-19 ENCOUNTER — Encounter: Payer: Self-pay | Admitting: Physical Therapy

## 2014-02-19 ENCOUNTER — Ambulatory Visit: Payer: BLUE CROSS/BLUE SHIELD | Admitting: Physical Therapy

## 2014-02-19 DIAGNOSIS — R269 Unspecified abnormalities of gait and mobility: Secondary | ICD-10-CM

## 2014-02-19 DIAGNOSIS — Z89611 Acquired absence of right leg above knee: Secondary | ICD-10-CM

## 2014-02-19 DIAGNOSIS — R6889 Other general symptoms and signs: Secondary | ICD-10-CM

## 2014-02-19 NOTE — Therapy (Signed)
[facility] [address] [place], [place], [address] Phone: [phone]   Fax:  [phone]  Physical Therapy Treatment  Patient Details  Name: Steven Logan MRN: [id number] Date of Birth: 04-20-1970 Referring Provider:  Tivis Ringer, MD  Encounter Date: 02/19/2014      PT End of Session - 02/19/14 0937    Visit Number 15   Number of Visits 17   Date for PT Re-Evaluation 02/15/13   PT Start Time 0931   PT Stop Time 1010   PT Time Calculation (min) 39 min   Equipment Utilized During Treatment Gait belt   Activity Tolerance Patient tolerated treatment well   Behavior During Therapy [facility] for tasks assessed/performed      Past Medical History  Diagnosis Date  . Eczema   . Morbid obesity   . Gout   . HTN (hypertension)   . Dyslipidemia   . Pulmonary embolism     3  in  lungs  at  one time...  . Abscess     great toe  . Pancreatitis   . Arthritis   . Depression   . Diabetes mellitus   . Anemia   . Pneumonia     2-3 years ago  . Shortness of breath     ?? chest  cold he has now.  10/8- no SOB  . Renal hypertension     Hemo started  Sept 2014- MWF  . Chronic kidney disease     esrd    Past Surgical History  Procedure Laterality Date  . None    . Av fistula placement Left 04/07/2012    Procedure: ARTERIOVENOUS (AV) FISTULA CREATION;  Surgeon: Angelia Mould, MD;  Location: [place];  Service: Vascular;  Laterality: Left;  . Ligation of competing branches of arteriovenous fistula Left 09/19/2012    Procedure: LIGATION OF COMPETING BRANCHES OF LEFT ARM ARTERIOVENOUS FISTULA; Vein angioplasty;  Surgeon: Angelia Mould, MD;  Location: [place];  Service: Vascular;  Laterality: Left;  . Insertion of dialysis catheter Right 09/19/2012    Procedure: INSERTION OF DIALYSIS CATHETER;  Surgeon: Angelia Mould, MD;  Location: [place];  Service: Vascular;  Laterality: Right;  . I&d extremity Right  06/02/2013    Procedure: IRRIGATION AND DEBRIDEMENT ARTHROSCOPIC RIGHT ANKLE;  Surgeon: Augustin Schooling, MD;  Location: [place];  Service: Orthopedics;  Laterality: Right;  . I&d extremity Right 06/04/2013    Procedure: IRRIGATION AND DEBRIDEMENT RIGHT FOOT/ANKLE;  Surgeon: Newt Minion, MD;  Location: [place];  Service: Orthopedics;  Laterality: Right;  . I&d extremity Right 06/06/2013    Procedure: IRRIGATION AND DEBRIDEMENT EXTREMITY;  Surgeon: Newt Minion, MD;  Location: [place];  Service: Orthopedics;  Laterality: Right;  . Amputation Right 06/12/2013    Procedure: Transtibial Amputation;  Surgeon: Newt Minion, MD;  Location: [place];  Service: Orthopedics;  Laterality: Right;  . I&d extremity Right 06/24/2013    Procedure: IRRIGATION AND DEBRIDEMENT and Revsion of TRANSTIBIAL AMPUTATION;  Surgeon: Newt Minion, MD;  Location: [facility];  Service: Orthopedics;  Laterality: Right;  . Amputation Right 06/27/2013    Procedure: AMPUTATION ABOVE KNEE;  Surgeon: Newt Minion, MD;  Location: [place];  Service: Orthopedics;  Laterality: Right;  Right Above Knee Amputation  . Fistulogram Left 08/07/2012    Procedure: FISTULOGRAM;  Surgeon: Angelia Mould, MD;  Location: [facility] CATH LAB;  Service: Cardiovascular;  Laterality: Left;  arm  There were no vitals taken for this visit.  Visit Diagnosis:  Abnormality of gait  Activity intolerance  Status post above knee amputation of right lower extremity      Subjective Assessment - 02/19/14 0935    Symptoms No falls or pain today. Did have increased pain in right leg after dialysis, gone today. Forgot to charge knee last night this am so now its locked/difficult to engage/bend.   Currently in Pain? No/denies   Pain Score 0-No pain             [facility] Adult PT Treatment/Exercise - 02/19/14 0001    Static Standing Balance   Static Standing - Balance Support Bilateral upper extremity supported;During functional activity   Static Standing - Level of  Assistance 5: Stand by assistance   Static Standing - Comment/# of Minutes right stance in parallel bars with left green theraband resisted hip flex/extension/abduct/adduct x 10 each way. Cues on posture and correct exercise form/technique.                                 Dynamic Standing Balance   Dynamic Standing - Balance Support No upper extremity supported;During functional activity   Dynamic Standing - Level of Assistance 5: Stand by assistance   Dynamic Standing - Balance Activities Rocker board;Head turns;Head nods;Eyes open;Foam balance beam  zoom ball, red foam beam   Dynamic Standing - Comments standing on floor zoom ball x 4-6 minutes with cues on bil foot position and posture; both ways on balance board holds with eyes closed, eyes open for head nods and shakes; with feet across red balance beam: with green theraband alternating rows, shoulder extension and alternating forward punches.                                    High Level Balance   High Level Balance Activities Backward walking;Tandem walking;Side stepping   High Level Balance Comments in parallel bars with intermittent UE support on bars and min assist for balance           PT Short Term Goals - 01/23/14 0924    PT SHORT TERM GOAL #1   Title Verbalize prpoper prosthetic care except cues to adjust the number of ply socks (Target Date: 01/17/14)   Baseline MET 01/15/14   Time 4   Period Weeks   Status Achieved   PT SHORT TERM GOAL #2   Title Tolerate wear of prosthesis >10 hours without change in skin integrity nor undue tenderness (Target Date: 01/17/14)   Baseline MET 01/15/14   Time 4   Period Weeks   Status Achieved   PT SHORT TERM GOAL #3   Title Ambulate 331f with rolling walker and prosthesis modified  (Target Date: 01/17/14)   Time 4   Period Weeks   Status Not Met   PT SHORT TERM GOAL #4   Title Negotiate ramp, curb, stairs (1 rail) with rolling walker and prosthesis modified indepent (Target Date:  01/17/14)   Time 4   Period Weeks   Status Not Met   PT SHORT TERM GOAL #5   Status On-going           PT Long Term Goals - 02/14/14 1256    PT LONG TERM GOAL #1   Title Demonstrate and verbalize proper prosthetic care to enable safe utilization of the  prosthesis (Target Date: 02/15/14)   Baseline 02/14/2014: Pt independent with cleaning and wear of prosthesis. Still needing cues on sock management and knee use/control.   Time 8   Period Weeks   Status Not Met   PT LONG TERM GOAL #2   Title Tolerate wear of prosthesis for >90% of awake hours without change in skin integrity nor undue tenderness.  (Target Date: 02/15/14)   Baseline 02/14/2014: Goal met.   Time 8   Period Weeks   Status Achieved   PT LONG TERM GOAL #3   Title Ambulate 558f with LRAD and prosthesis independently (Target Date: 02/15/14)   Baseline 02/14/2014: Goal met at RW level.   Time 8   Period Weeks   Status Achieved   PT LONG TERM GOAL #4   Title Ambulate 2027fon uneven (grass) surfaces with LRAD and prosthesis independently (Target Date: 02/15/14)   Baseline Baseline on 12/26/13: Patient recieved his first prosthesis on 12/25/13. Patient is unable to ambulate on uneven surfaces.   Time 8   Period Weeks   Status Unable to assess  icy ground conditions outside.   PT LONG TERM GOAL #5   Title Negotiate ramp, curb, stairs with LRAD and prosthesis independently (Target Date: 02/15/14)   Baseline 02/14/2014: mostly met at RW level. Still needs cues at time placing him at supervision level with mobility.   Time 8   Period Weeks   Status Partially Met   PT LONG TERM GOAL #6   Title Perform standing activities for >20 minutes with prosthesis with no pain or discomfort (Target Date: 02/15/14)   Baseline 02/14/2014: met.   Time 8   Period Weeks   Status Achieved   PT LONG TERM GOAL #7   Title Berg Balance test score with prosthesis >/= 45/56 (Target Date: 02/15/14)   Baseline 02/15/2104: Berg score was 38/56.   Time 8    Period Weeks   Status Not Met           Plan - 02/19/14 [id number]  Clinical Impression Statement Focused on balance activites today due to pt's microprossessor not charged to allow full knee function with prosthesis. Pt tolerated well without complaints.   Pt will benefit from skilled therapeutic intervention in order to improve on the following deficits Abnormal gait;Difficulty walking;Decreased endurance;Decreased activity tolerance;Decreased balance;Decreased mobility;Decreased strength;Decreased knowledge of use of DME;Other (comment)  prosthetic dependency   Rehab Potential Good   PT Frequency 2x / week   PT Duration 8 weeks   PT Treatment/Interventions Therapeutic activities;ADLs/Self Care Home Management;Patient/family education;DME Instruction;Therapeutic exercise;Gait training;Balance training;Manual techniques;Stair training;Neuromuscular re-education;Energy conservation;Functional mobility training;Other (comment)  prosthetic training   PT Next Visit Plan Continue to work on prosthetic knee control with gait/transfers working with forearm crutches.   Consulted and Agree with Plan of Care Patient        Problem List Patient Active Problem List   Diagnosis Date Noted  . Acute blood loss anemia 06/25/2013  . Hyperglycemia 06/07/2013  . Arm DVT (deep venous thromboembolism), acute 06/07/2013  . ESRD on dialysis 06/05/2013  . Encephalopathy, toxic 06/03/2013  . Cellulitis and abscess of leg 06/03/2013  . Anemia in chronic kidney disease 06/03/2013  . Arm edema 06/03/2013  . Leg abscess 06/03/2013  . Severe sepsis 06/03/2013  . Septic shock 06/03/2013  . Septic arthritis of ankle or foot, right 06/03/2013  . Abscess of right lower leg 06/02/2013  . Wrist lump 07/05/2012  . Swelling of limb-Left index and middle fingers  07/05/2012  . Aftercare following surgery of the circulatory system, [place] 07/05/2012  . Tingling-left wrist and some pain. 07/05/2012  . Pulmonary edema  05/04/2012  . Acute respiratory failure 05/04/2012  . CKD (chronic kidney disease) stage 5, GFR less than 15 ml/min 05/04/2012  . Hyperkalemia 05/04/2012  . Pre-operative cardiovascular examination 03/15/2012  . Iron deficiency anemia 09/22/2010  . HTN (hypertension) 09/22/2010  . Hyperlipidemia 09/22/2010  . Gout 09/22/2010  . CRI (chronic renal insufficiency) 09/22/2010  . Pulmonary embolism, bilateral 09/22/2010    Willow Ora 02/19/2014, 11:52 AM  Willow Ora, PTA, [facility] [address], [place], [place] [address] [phone] 02/19/2014, 11:52 AM

## 2014-02-21 ENCOUNTER — Ambulatory Visit: Payer: BLUE CROSS/BLUE SHIELD | Admitting: Physical Therapy

## 2014-02-22 ENCOUNTER — Encounter: Payer: Self-pay | Admitting: Physical Therapy

## 2014-02-22 ENCOUNTER — Ambulatory Visit: Payer: BLUE CROSS/BLUE SHIELD | Admitting: Physical Therapy

## 2014-02-22 DIAGNOSIS — R6889 Other general symptoms and signs: Secondary | ICD-10-CM

## 2014-02-22 DIAGNOSIS — Z89611 Acquired absence of right leg above knee: Secondary | ICD-10-CM

## 2014-02-22 DIAGNOSIS — R269 Unspecified abnormalities of gait and mobility: Secondary | ICD-10-CM

## 2014-02-22 NOTE — Therapy (Signed)
[facility] [address] [place], [place], [address] Phone: [phone]   Fax:  [phone]  Physical Therapy Treatment  Patient Details  Name: Steven Logan MRN: [id number] Date of Birth: [birth year] Referring Provider:  Tivis Ringer, MD  Encounter Date: 02/22/2014      PT End of Session - 02/22/14 1633    Visit Number 16   Number of Visits 17   Date for PT Re-Evaluation 02/15/13   PT Start Time 1532   PT Stop Time 1615   PT Time Calculation (min) 43 min   Equipment Utilized During Treatment Gait belt   Activity Tolerance Patient tolerated treatment well   Behavior During Therapy [facility] for tasks assessed/performed      Past Medical History  Diagnosis Date  . Eczema   . Morbid obesity   . Gout   . HTN (hypertension)   . Dyslipidemia   . Pulmonary embolism     3  in  lungs  at  one time...  . Abscess     great toe  . Pancreatitis   . Arthritis   . Depression   . Diabetes mellitus   . Anemia   . Pneumonia     2-3 years ago  . Shortness of breath     ?? chest  cold he has now.  10/8- no SOB  . Renal hypertension     Hemo started  Sept 2014- MWF  . Chronic kidney disease     esrd    Past Surgical History  Procedure Laterality Date  . None    . Av fistula placement Left 04/07/2012    Procedure: ARTERIOVENOUS (AV) FISTULA CREATION;  Surgeon: Angelia Mould, MD;  Location: [place];  Service: Vascular;  Laterality: Left;  . Ligation of competing branches of arteriovenous fistula Left 09/19/2012    Procedure: LIGATION OF COMPETING BRANCHES OF LEFT ARM ARTERIOVENOUS FISTULA; Vein angioplasty;  Surgeon: Angelia Mould, MD;  Location: [place];  Service: Vascular;  Laterality: Left;  . Insertion of dialysis catheter Right 09/19/2012    Procedure: INSERTION OF DIALYSIS CATHETER;  Surgeon: Angelia Mould, MD;  Location: [place];  Service: Vascular;  Laterality: Right;  . I&d extremity Right  06/02/2013    Procedure: IRRIGATION AND DEBRIDEMENT ARTHROSCOPIC RIGHT ANKLE;  Surgeon: Augustin Schooling, MD;  Location: [place];  Service: Orthopedics;  Laterality: Right;  . I&d extremity Right 06/04/2013    Procedure: IRRIGATION AND DEBRIDEMENT RIGHT FOOT/ANKLE;  Surgeon: Newt Minion, MD;  Location: [place];  Service: Orthopedics;  Laterality: Right;  . I&d extremity Right 06/06/2013    Procedure: IRRIGATION AND DEBRIDEMENT EXTREMITY;  Surgeon: Newt Minion, MD;  Location: [place];  Service: Orthopedics;  Laterality: Right;  . Amputation Right 06/12/2013    Procedure: Transtibial Amputation;  Surgeon: Newt Minion, MD;  Location: [place];  Service: Orthopedics;  Laterality: Right;  . I&d extremity Right 06/24/2013    Procedure: IRRIGATION AND DEBRIDEMENT and Revsion of TRANSTIBIAL AMPUTATION;  Surgeon: Newt Minion, MD;  Location: [place];  Service: Orthopedics;  Laterality: Right;  . Amputation Right 06/27/2013    Procedure: AMPUTATION ABOVE KNEE;  Surgeon: Newt Minion, MD;  Location: [place];  Service: Orthopedics;  Laterality: Right;  Right Above Knee Amputation  . Fistulogram Left 08/07/2012    Procedure: FISTULOGRAM;  Surgeon: Angelia Mould, MD;  Location: [facility] CATH LAB;  Service: Cardiovascular;  Laterality: Left;  arm  There were no vitals taken for this visit.  Visit Diagnosis:  Abnormality of gait  Activity intolerance  Status post above knee amputation of right lower extremity      Subjective Assessment - 02/22/14 1536    Symptoms No falls or pain to report. Charged prosthesis as suppossed too. Feeling weak after diaylisis this am.   Currently in Pain? No/denies   Pain Score 0-No pain          [facility] Adult PT Treatment/Exercise - 02/22/14 1537    Transfers   Sit to Stand 5: Supervision;With armrests;From chair/3-in-1;With upper extremity assist   Stand to Sit 5: Supervision;With upper extremity assist;With armrests;To chair/3-in-1   Ambulation/Gait   Ambulation/Gait Yes    Ambulation/Gait Assistance 5: Supervision;4: Min assist;4: Min guard   Ambulation/Gait Assistance Details cues on posture, sequence, stride length and crutch placement with both indoor level and outdoor paved surfaces. min guard assist on paved surfaces.  min assist with 1 cructh with cues on sequence and stride length.                      Ambulation Distance (Feet) 215 Feet  x2, 200 ft x1 both crutches; 120 feet 1 crutch   Assistive device Crutches   Gait Pattern Step-through pattern;Decreased stride length;Narrow base of support   Ambulation Surface Level;Indoor   Stairs Yes   Stairs Assistance 5: Supervision   Stairs Assistance Details (indicate cue type and reason) cues on posture and sequence with 1 crutch and 1 rail.                                  Stair Management Technique One rail Right;One rail Left;Step to pattern;Forwards;With crutches  prosthesis   Number of Stairs 4  x3   Prosthetics   Current prosthetic wear tolerance (days/week)  7 days /wk   Current prosthetic wear tolerance (#hours/day)  all awake hours  drying 2x a day   Residual limb condition  intact per patient   Education Provided Correct ply sock adjustment;Residual limb care   Person(s) Educated Patient   Education Method Explanation;Demonstration   Education Method Verbalized understanding   Donning Prosthesis Modified independent (device/increased time)   Doffing Prosthesis Modified independent (device/increased time)           PT Short Term Goals - 01/23/14 0924    PT SHORT TERM GOAL #1   Title Verbalize prpoper prosthetic care except cues to adjust the number of ply socks (Target Date: 01/17/14)   Baseline MET 01/15/14   Time 4   Period Weeks   Status Achieved   PT SHORT TERM GOAL #2   Title Tolerate wear of prosthesis >10 hours without change in skin integrity nor undue tenderness (Target Date: 01/17/14)   Baseline MET 01/15/14   Time 4   Period Weeks   Status Achieved   PT SHORT TERM GOAL  #3   Title Ambulate 373f with rolling walker and prosthesis modified  (Target Date: 01/17/14)   Time 4   Period Weeks   Status Not Met   PT SHORT TERM GOAL #4   Title Negotiate ramp, curb, stairs (1 rail) with rolling walker and prosthesis modified indepent (Target Date: 01/17/14)   Time 4   Period Weeks   Status Not Met   PT SHORT TERM GOAL #5   Status On-going           PT  Long Term Goals - 02/14/14 1256    PT LONG TERM GOAL #1   Title Demonstrate and verbalize proper prosthetic care to enable safe utilization of the prosthesis (Target Date: 02/15/14)   Baseline 02/14/2014: Pt independent with cleaning and wear of prosthesis. Still needing cues on sock management and knee use/control.   Time 8   Period Weeks   Status Not Met   PT LONG TERM GOAL #2   Title Tolerate wear of prosthesis for >90% of awake hours without change in skin integrity nor undue tenderness.  (Target Date: 02/15/14)   Baseline 02/14/2014: Goal met.   Time 8   Period Weeks   Status Achieved   PT LONG TERM GOAL #3   Title Ambulate 582f with LRAD and prosthesis independently (Target Date: 02/15/14)   Baseline 02/14/2014: Goal met at RW level.   Time 8   Period Weeks   Status Achieved   PT LONG TERM GOAL #4   Title Ambulate 2080fon uneven (grass) surfaces with LRAD and prosthesis independently (Target Date: 02/15/14)   Baseline Baseline on 12/26/13: Patient recieved his first prosthesis on 12/25/13. Patient is unable to ambulate on uneven surfaces.   Time 8   Period Weeks   Status Unable to assess  icy ground conditions outside.   PT LONG TERM GOAL #5   Title Negotiate ramp, curb, stairs with LRAD and prosthesis independently (Target Date: 02/15/14)   Baseline 02/14/2014: mostly met at RW level. Still needs cues at time placing him at supervision level with mobility.   Time 8   Period Weeks   Status Partially Met   PT LONG TERM GOAL #6   Title Perform standing activities for >20 minutes with prosthesis with  no pain or discomfort (Target Date: 02/15/14)   Baseline 02/14/2014: met.   Time 8   Period Weeks   Status Achieved   PT LONG TERM GOAL #7   Title Berg Balance test score with prosthesis >/= 45/56 (Target Date: 02/15/14)   Baseline 02/15/2104: Berg score was 38/56.   Time 8   Period Weeks   Status Not Met           Plan - 02/22/14 1634    Clinical Impression Statement Pt making steady progress toward goals.   Pt will benefit from skilled therapeutic intervention in order to improve on the following deficits Abnormal gait;Difficulty walking;Decreased endurance;Decreased activity tolerance;Decreased balance;Decreased mobility;Decreased strength;Decreased knowledge of use of DME;Other (comment)  prosthetic dependency   Rehab Potential Good   PT Frequency 2x / week   PT Duration 8 weeks   PT Treatment/Interventions Therapeutic activities;ADLs/Self Care Home Management;Patient/family education;DME Instruction;Therapeutic exercise;Gait training;Balance training;Manual techniques;Stair training;Neuromuscular re-education;Energy conservation;Functional mobility training;Other (comment)  prosthetic training   PT Next Visit Plan Continue to work on prosthetic knee control with gait/transfers working with forearm crutches.   Consulted and Agree with Plan of Care Patient     Problem List Patient Active Problem List   Diagnosis Date Noted  . Acute blood loss anemia 06/25/2013  . Hyperglycemia 06/07/2013  . Arm DVT (deep venous thromboembolism), acute 06/07/2013  . ESRD on dialysis 06/05/2013  . Encephalopathy, toxic 06/03/2013  . Cellulitis and abscess of leg 06/03/2013  . Anemia in chronic kidney disease 06/03/2013  . Arm edema 06/03/2013  . Leg abscess 06/03/2013  . Severe sepsis 06/03/2013  . Septic shock 06/03/2013  . Septic arthritis of ankle or foot, right 06/03/2013  . Abscess of right lower leg 06/02/2013  . Wrist lump 07/05/2012  .  Swelling of limb-Left index and middle fingers  07/05/2012  . Aftercare following surgery of the circulatory system, [place] 07/05/2012  . Tingling-left wrist and some pain. 07/05/2012  . Pulmonary edema 05/04/2012  . Acute respiratory failure 05/04/2012  . CKD (chronic kidney disease) stage 5, GFR less than 15 ml/min 05/04/2012  . Hyperkalemia 05/04/2012  . Pre-operative cardiovascular examination 03/15/2012  . Iron deficiency anemia 09/22/2010  . HTN (hypertension) 09/22/2010  . Hyperlipidemia 09/22/2010  . Gout 09/22/2010  . CRI (chronic renal insufficiency) 09/22/2010  . Pulmonary embolism, bilateral 09/22/2010    Willow Ora 02/22/2014, 4:35 PM  Willow Ora, PTA, [facility] [address] [place], [place] [address] [phone] 02/22/2014, 4:35 PM

## 2014-02-26 ENCOUNTER — Encounter: Payer: Self-pay | Admitting: Physical Therapy

## 2014-02-26 ENCOUNTER — Ambulatory Visit: Payer: BLUE CROSS/BLUE SHIELD | Attending: Orthopedic Surgery | Admitting: Physical Therapy

## 2014-02-26 DIAGNOSIS — R6889 Other general symptoms and signs: Secondary | ICD-10-CM

## 2014-02-26 DIAGNOSIS — R5381 Other malaise: Secondary | ICD-10-CM | POA: Diagnosis not present

## 2014-02-26 DIAGNOSIS — R269 Unspecified abnormalities of gait and mobility: Secondary | ICD-10-CM | POA: Diagnosis not present

## 2014-02-26 DIAGNOSIS — Z89611 Acquired absence of right leg above knee: Secondary | ICD-10-CM | POA: Insufficient documentation

## 2014-02-26 NOTE — Therapy (Signed)
[facility] [address] [place], [place], [address] Phone: [phone]   Fax:  [phone]  Physical Therapy Treatment  Patient Details  Name: Steven Logan MRN: [id number] Date of Birth: 1970-09-17 Referring Provider:  Tivis Ringer, MD  Encounter Date: 02/26/2014      PT End of Session - 02/26/14 0930    Visit Number 17   Number of Visits 30   Date for PT Re-Evaluation 04/12/14   PT Start Time 0930   PT Stop Time 1015   PT Time Calculation (min) 45 min   Equipment Utilized During Treatment Gait belt   Activity Tolerance Patient tolerated treatment well   Behavior During Therapy [facility] for tasks assessed/performed      Past Medical History  Diagnosis Date  . Eczema   . Morbid obesity   . Gout   . HTN (hypertension)   . Dyslipidemia   . Pulmonary embolism     3  in  lungs  at  one time...  . Abscess     great toe  . Pancreatitis   . Arthritis   . Depression   . Diabetes mellitus   . Anemia   . Pneumonia     2-3 years ago  . Shortness of breath     ?? chest  cold he has now.  10/8- no SOB  . Renal hypertension     Hemo started  Sept 2014- MWF  . Chronic kidney disease     esrd    Past Surgical History  Procedure Laterality Date  . None    . Av fistula placement Left 04/07/2012    Procedure: ARTERIOVENOUS (AV) FISTULA CREATION;  Surgeon: Angelia Mould, MD;  Location: [place];  Service: Vascular;  Laterality: Left;  . Ligation of competing branches of arteriovenous fistula Left 09/19/2012    Procedure: LIGATION OF COMPETING BRANCHES OF LEFT ARM ARTERIOVENOUS FISTULA; Vein angioplasty;  Surgeon: Angelia Mould, MD;  Location: [place];  Service: Vascular;  Laterality: Left;  . Insertion of dialysis catheter Right 09/19/2012    Procedure: INSERTION OF DIALYSIS CATHETER;  Surgeon: Angelia Mould, MD;  Location: [place];  Service: Vascular;  Laterality: Right;  . I&d extremity Right  06/02/2013    Procedure: IRRIGATION AND DEBRIDEMENT ARTHROSCOPIC RIGHT ANKLE;  Surgeon: Augustin Schooling, MD;  Location: [place];  Service: Orthopedics;  Laterality: Right;  . I&d extremity Right 06/04/2013    Procedure: IRRIGATION AND DEBRIDEMENT RIGHT FOOT/ANKLE;  Surgeon: Newt Minion, MD;  Location: [place];  Service: Orthopedics;  Laterality: Right;  . I&d extremity Right 06/06/2013    Procedure: IRRIGATION AND DEBRIDEMENT EXTREMITY;  Surgeon: Newt Minion, MD;  Location: [place];  Service: Orthopedics;  Laterality: Right;  . Amputation Right 06/12/2013    Procedure: Transtibial Amputation;  Surgeon: Newt Minion, MD;  Location: [place];  Service: Orthopedics;  Laterality: Right;  . I&d extremity Right 06/24/2013    Procedure: IRRIGATION AND DEBRIDEMENT and Revsion of TRANSTIBIAL AMPUTATION;  Surgeon: Newt Minion, MD;  Location: [place];  Service: Orthopedics;  Laterality: Right;  . Amputation Right 06/27/2013    Procedure: AMPUTATION ABOVE KNEE;  Surgeon: Newt Minion, MD;  Location: [place];  Service: Orthopedics;  Laterality: Right;  Right Above Knee Amputation  . Fistulogram Left 08/07/2012    Procedure: FISTULOGRAM;  Surgeon: Angelia Mould, MD;  Location: [facility] CATH LAB;  Service: Cardiovascular;  Laterality: Left;  arm  There were no vitals taken for this visit.  Visit Diagnosis:  Abnormality of gait - Plan: PT plan of care cert/re-cert  Activity intolerance - Plan: PT plan of care cert/re-cert  Status post above knee amputation of right lower extremity - Plan: PT plan of care cert/re-cert      Subjective Assessment - 02/26/14 0934    Symptoms No falls or pain. He has not heard from prosthetist. PT called prosthetist and he plans to see pt within next 2 weeks to work on new socket.   Currently in Pain? No/denies                    [facility] Adult PT Treatment/Exercise - 02/26/14 0930    Transfers   Sit to Stand 5: Supervision;With armrests;From chair/3-in-1;With upper  extremity assist   Stand to Sit 5: Supervision;With upper extremity assist;With armrests;To chair/3-in-1   Ambulation/Gait   Ambulation/Gait Yes   Ambulation/Gait Assistance 5: Supervision   Ambulation/Gait Assistance Details cues on pelvic motion to advance prosthesis, distance of right cructh placement to facilitate proper wt shift   Ambulation Distance (Feet) 510 Feet   Assistive device Crutches;Prosthesis   Gait Pattern Step-through pattern;Decreased stride length;Narrow base of support   Ambulation Surface Level;Unlevel;Indoor;Outdoor;Paved;Gravel;Grass   Stairs Yes   Stairs Assistance 5: Supervision;4: Min assist   Stairs Assistance Details (indicate cue type and reason) PT verbally instructed with demo how to engage hydraulics on prosthesis to lower body wt using step to with left leg ("good")   Stair Management Technique One rail Right;Step to pattern;Forwards;With crutches;Two rails  prosthesis, initiated with 2 rails, progressed 1rail/1 crutc   Number of Stairs 4  15X   Ramp 5: Supervision  with prosthesis & forearm crutches   Curb 5: Supervision  with forearm crutches & prosthesis   Prosthetics   Current prosthetic wear tolerance (days/week)  7 days /wk   Current prosthetic wear tolerance (#hours/day)  all awake hours  drying 2x a day   Residual limb condition  intact per patient                  PT Short Term Goals - 02/26/14 0930    PT SHORT TERM GOAL #1   Title Verbalize prpoper prosthetic care except cues to adjust the number of ply socks (Target Date: 01/17/14)   Baseline MET 01/15/14   Time 4   Period Weeks   Status Achieved   PT SHORT TERM GOAL #2   Title Tolerate wear of prosthesis >10 hours without change in skin integrity nor undue tenderness (Target Date: 01/17/14)   Baseline MET 01/15/14   Time 4   Period Weeks   Status Achieved   PT SHORT TERM GOAL #3   Title Ambulate 310f with rolling walker and prosthesis modified  (Target Date:  01/17/14)   Time 4   Period Weeks   Status Not Met   PT SHORT TERM GOAL #4   Title Negotiate ramp, curb, stairs (1 rail) with rolling walker and prosthesis modified indepent (Target Date: 01/17/14)   Time 4   Period Weeks   Status Not Met   PT SHORT TERM GOAL #5   Title negotiates ramp & curb with forearm crutches & prosthesis modified independent. (Target Date: 03/15/14)   Status New   Additional Short Term Goals   Additional Short Term Goals Yes   PT SHORT TERM GOAL #6   Title descends stairs with 2 rails using hydraulics of prosthesis with cues only. (Target  Date: 03/15/14)   Status New   PT SHORT TERM GOAL #7   Title ambulates 150' with 1 forearm cructh & prosthesis with minimal gaurd. (Target Date: 03/15/14)   Status New   PT SHORT TERM GOAL #8   Title picks up object from floor with supervision safely (Target Date: 03/15/14)   Status New           PT Long Term Goals - 02/26/14 0930    PT LONG TERM GOAL #1   Title Demonstrate and verbalize proper prosthetic care to enable safe utilization of the prosthesis (NEW Target Date: 04/12/14)   Baseline 02/14/2014: Pt independent with cleaning and wear of prosthesis. Still needing cues on sock management and knee use/control.   Time 8   Period Weeks   Status On-going   PT LONG TERM GOAL #2   Title Tolerate wear of prosthesis for >90% of awake hours without change in skin integrity nor undue tenderness.  (Target Date: 02/15/14)   Baseline 02/14/2014: Goal met.   Time 8   Period Weeks   Status Achieved   PT LONG TERM GOAL #3   Title Ambulate 518f with single point cane and prosthesis independently  (NEW Target Date: 04/12/14)   Baseline 02/14/2014: Goal met at RW level when set as LRAD   Time 8   Period Weeks   Status On-going   PT LONG TERM GOAL #4   Title Ambulate 2059fon uneven (grass) surfaces with single point cane and prosthesis independently  (NEW Target Date: 04/12/14)   Baseline Baseline on 12/26/13: Patient recieved his  first prosthesis on 12/25/13. Patient is unable to ambulate on uneven surfaces.   Time 8   Period Weeks   Status On-going  icy ground conditions outside.   PT LONG TERM GOAL #5   Title Negotiate ramp, curb, stairs (1 rail) with single point cane and prosthesis independently  (NEW Target Date: 04/12/14)   Baseline 02/14/2014: mostly met at RW level. Still needs cues at time placing him at supervision level with mobility.   Time 8   Period Weeks   Status On-going   PT LONG TERM GOAL #6   Title Perform standing activities for >20 minutes with prosthesis with no pain or discomfort (Target Date:02/15/14)   Baseline 02/14/2014: met.   Time 8   Period Weeks   Status Achieved   PT LONG TERM GOAL #7   Title Berg Balance test score with prosthesis >/= 45/56  (NEW Target Date: 04/12/14)   Baseline 02/15/2104: Berg score was 38/56.   Time 8   Period Weeks   Status On-going               Plan - 02/26/14 0930    Clinical Impression Statement Patient was able to engage hydraulics on prosthetic knee with 2 rails descending stairs. He was able to use 2 forearm crutches with prosthesis with no balance issues for fall risk but cueing to improve gait deviations to enable to further decrease UE support.   Pt will benefit from skilled therapeutic intervention in order to improve on the following deficits Abnormal gait;Difficulty walking;Decreased endurance;Decreased activity tolerance;Decreased balance;Decreased mobility;Decreased strength;Decreased knowledge of use of DME;Other (comment)  prosthetic dependency   Rehab Potential Good   PT Frequency 2x / week   PT Duration 8 weeks   PT Treatment/Interventions Therapeutic activities;ADLs/Self Care Home Management;Patient/family education;DME Instruction;Therapeutic exercise;Gait training;Balance training;Manual techniques;Stair training;Neuromuscular re-education;Energy conservation;Functional mobility training;Other (comment)  prosthetic training   PT  Next Visit Plan  work towards updated STGs. Forearm crutches outside if weather permits. work with single point cane on level surfaces. Work on Building control surveyor on stairs.   Consulted and Agree with Plan of Care Patient        Problem List Patient Active Problem List   Diagnosis Date Noted  . Acute blood loss anemia 06/25/2013  . Hyperglycemia 06/07/2013  . Arm DVT (deep venous thromboembolism), acute 06/07/2013  . ESRD on dialysis 06/05/2013  . Encephalopathy, toxic 06/03/2013  . Cellulitis and abscess of leg 06/03/2013  . Anemia in chronic kidney disease 06/03/2013  . Arm edema 06/03/2013  . Leg abscess 06/03/2013  . Severe sepsis 06/03/2013  . Septic shock 06/03/2013  . Septic arthritis of ankle or foot, right 06/03/2013  . Abscess of right lower leg 06/02/2013  . Wrist lump 07/05/2012  . Swelling of limb-Left index and middle fingers 07/05/2012  . Aftercare following surgery of the circulatory system, [place] 07/05/2012  . Tingling-left wrist and some pain. 07/05/2012  . Pulmonary edema 05/04/2012  . Acute respiratory failure 05/04/2012  . CKD (chronic kidney disease) stage 5, GFR less than 15 ml/min 05/04/2012  . Hyperkalemia 05/04/2012  . Pre-operative cardiovascular examination 03/15/2012  . Iron deficiency anemia 09/22/2010  . HTN (hypertension) 09/22/2010  . Hyperlipidemia 09/22/2010  . Gout 09/22/2010  . CRI (chronic renal insufficiency) 09/22/2010  . Pulmonary embolism, bilateral 09/22/2010    Jamey Reas PT, DPT PT Specializing in Prosthetics 02/26/2014, 5:28 PM  [facility] [address] [place], [place], [address] Phone: [phone]   Fax:  [phone]

## 2014-02-28 ENCOUNTER — Ambulatory Visit: Payer: BLUE CROSS/BLUE SHIELD | Admitting: Physical Therapy

## 2014-03-05 ENCOUNTER — Encounter: Payer: Self-pay | Admitting: Physical Therapy

## 2014-03-05 ENCOUNTER — Ambulatory Visit: Payer: BLUE CROSS/BLUE SHIELD | Admitting: Physical Therapy

## 2014-03-05 DIAGNOSIS — Z89611 Acquired absence of right leg above knee: Secondary | ICD-10-CM | POA: Diagnosis not present

## 2014-03-05 DIAGNOSIS — R6889 Other general symptoms and signs: Secondary | ICD-10-CM

## 2014-03-05 DIAGNOSIS — R269 Unspecified abnormalities of gait and mobility: Secondary | ICD-10-CM

## 2014-03-05 NOTE — Therapy (Signed)
[facility] [address] [place], [place], [address] Phone: [phone]   Fax:  [phone]  Physical Therapy Treatment  Patient Details  Name: Steven Logan MRN: [id number] Date of Birth: Sep 12, 1970 Referring Provider:  Tivis Ringer, MD  Encounter Date: 03/05/2014      PT End of Session - 03/05/14 0930    Visit Number 18   Number of Visits 30   Date for PT Re-Evaluation 04/12/14   PT Start Time 0930   PT Stop Time 1015   PT Time Calculation (min) 45 min   Equipment Utilized During Treatment Gait belt   Activity Tolerance Patient tolerated treatment well   Behavior During Therapy [facility] for tasks assessed/performed      Past Medical History  Diagnosis Date  . Eczema   . Morbid obesity   . Gout   . HTN (hypertension)   . Dyslipidemia   . Pulmonary embolism     3  in  lungs  at  one time...  . Abscess     great toe  . Pancreatitis   . Arthritis   . Depression   . Diabetes mellitus   . Anemia   . Pneumonia     2-3 years ago  . Shortness of breath     ?? chest  cold he has now.  10/8- no SOB  . Renal hypertension     Hemo started  Sept 2014- MWF  . Chronic kidney disease     esrd    Past Surgical History  Procedure Laterality Date  . None    . Av fistula placement Left 04/07/2012    Procedure: ARTERIOVENOUS (AV) FISTULA CREATION;  Surgeon: Angelia Mould, MD;  Location: [place];  Service: Vascular;  Laterality: Left;  . Ligation of competing branches of arteriovenous fistula Left 09/19/2012    Procedure: LIGATION OF COMPETING BRANCHES OF LEFT ARM ARTERIOVENOUS FISTULA; Vein angioplasty;  Surgeon: Angelia Mould, MD;  Location: [place];  Service: Vascular;  Laterality: Left;  . Insertion of dialysis catheter Right 09/19/2012    Procedure: INSERTION OF DIALYSIS CATHETER;  Surgeon: Angelia Mould, MD;  Location: [place];  Service: Vascular;  Laterality: Right;  . I&d extremity Right  06/02/2013    Procedure: IRRIGATION AND DEBRIDEMENT ARTHROSCOPIC RIGHT ANKLE;  Surgeon: Augustin Schooling, MD;  Location: [facility];  Service: Orthopedics;  Laterality: Right;  . I&d extremity Right 06/04/2013    Procedure: IRRIGATION AND DEBRIDEMENT RIGHT FOOT/ANKLE;  Surgeon: Newt Minion, MD;  Location: [place];  Service: Orthopedics;  Laterality: Right;  . I&d extremity Right 06/06/2013    Procedure: IRRIGATION AND DEBRIDEMENT EXTREMITY;  Surgeon: Newt Minion, MD;  Location: [place];  Service: Orthopedics;  Laterality: Right;  . Amputation Right 06/12/2013    Procedure: Transtibial Amputation;  Surgeon: Newt Minion, MD;  Location: [facility];  Service: Orthopedics;  Laterality: Right;  . I&d extremity Right 06/24/2013    Procedure: IRRIGATION AND DEBRIDEMENT and Revsion of TRANSTIBIAL AMPUTATION;  Surgeon: Newt Minion, MD;  Location: [place];  Service: Orthopedics;  Laterality: Right;  . Amputation Right 06/27/2013    Procedure: AMPUTATION ABOVE KNEE;  Surgeon: Newt Minion, MD;  Location: [place];  Service: Orthopedics;  Laterality: Right;  Right Above Knee Amputation  . Fistulogram Left 08/07/2012    Procedure: FISTULOGRAM;  Surgeon: Angelia Mould, MD;  Location[facility] CATH LAB;  Service: Cardiovascular;  Laterality: Left;  arm  There were no vitals taken for this visit.  Visit Diagnosis:  Abnormality of gait  Activity intolerance  Status post above knee amputation of right lower extremity      Subjective Assessment - 03/05/14 0935    Symptoms No falls or pain. Questioning when he can go back to work.   Currently in Pain? 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 No half shifts available. He has to retrieve items: golf cart to different building, Supply building requires ambulating ~75' from entrance to cooler. 4 wheeled cart to move items from cooler to golf cart, usually  takes 2-3 trips. Moving items up to 10# in cooler from shelf to rolling cart. He can use rolling cart for balance to/from golf cart to cooler. Door to supply cooler is not automated and has small threshold lip. Back at main building he can load items from golf cart to another rolling cart. He has to push rolling cart ~30' to kitchen area. He requires to wear slick resistance shoes. ~3 steps to get from counter to stove which is 180 degrees from counter. Then hot food goes from stove to steam table which is beside prep table. Cold food can use rolling cart. He stands to prepare plates at steam table. Prepared plates go on rolling cart. CNAs come to pick up prepared plates on rolling carts. He has to wash dishes at this point. He has to load into dish washer at waist height. At end of meal, he has to roll empty carts to 4 neighborhoods which are halls in same building (A & D are furthest at ~100 yd) He has to return one cart per neighborhood / hall. He will ask to see if they will permit use of a cane to return to kitchen area. He does this for both breakfast & lunch. He will also ask if bar stool during some prep.   Prosthetic Training: See patient education. Standing balance at counter moving objects. PT demo using towel to slide heavier or hot items. Patient return demo understanding. Patient sidestep, backwards and forward gait initially with support of counter but progressed to supervision with counter near but not touching.                     PT Education - 03/05/14 1003    Education provided Yes   Education Details changing shoes, sweat rash with deodorant use, wiping limb upon removing & mid-day, cleaning daily and use baby oil weekly, weight changes & prosthetic fit,    Person(s) Educated Patient   Methods Explanation   Comprehension Verbalized understanding          PT Short Term Goals - 02/26/14 0930    PT SHORT TERM GOAL #1   Title Verbalize prpoper prosthetic care except  cues to adjust the number of ply socks (Target Date: 01/17/14)   Baseline MET 01/15/14   Time 4   Period Weeks   Status Achieved   PT SHORT TERM GOAL #2   Title Tolerate wear of prosthesis >10 hours without change in skin integrity nor undue tenderness (Target Date: 01/17/14)   Baseline MET 01/15/14   Time 4   Period Weeks   Status Achieved   PT SHORT TERM GOAL #3   Title Ambulate 340f with rolling walker and prosthesis modified  (Target Date: 01/17/14)   Time 4  Period Weeks   Status Not Met   PT SHORT TERM GOAL #4   Title Negotiate ramp, curb, stairs (1 rail) with rolling walker and prosthesis modified indepent (Target Date: 01/17/14)   Time 4   Period Weeks   Status Not Met   PT SHORT TERM GOAL #5   Title negotiates ramp & curb with forearm crutches & prosthesis modified independent. (Target Date: 03/15/14)   Status New   Additional Short Term Goals   Additional Short Term Goals Yes   PT SHORT TERM GOAL #6   Title descends stairs with 2 rails using hydraulics of prosthesis with cues only. (Target Date: 03/15/14)   Status New   PT SHORT TERM GOAL #7   Title ambulates 150' with 1 forearm cructh & prosthesis with minimal gaurd. (Target Date: 03/15/14)   Status New   PT SHORT TERM GOAL #8   Title picks up object from floor with supervision safely (Target Date: 03/15/14)   Status New           PT Long Term Goals - 02/26/14 0930    PT LONG TERM GOAL #1   Title Demonstrate and verbalize proper prosthetic care to enable safe utilization of the prosthesis (NEW Target Date: 04/12/14)   Baseline 02/14/2014: Pt independent with cleaning and wear of prosthesis. Still needing cues on sock management and knee use/control.   Time 8   Period Weeks   Status On-going   PT LONG TERM GOAL #2   Title Tolerate wear of prosthesis for >90% of awake hours without change in skin integrity nor undue tenderness.  (Target Date: 02/15/14)   Baseline 02/14/2014: Goal met.   Time 8   Period Weeks    Status Achieved   PT LONG TERM GOAL #3   Title Ambulate 573f with single point cane and prosthesis independently  (NEW Target Date: 04/12/14)   Baseline 02/14/2014: Goal met at RW level when set as LRAD   Time 8   Period Weeks   Status On-going   PT LONG TERM GOAL #4   Title Ambulate 2056fon uneven (grass) surfaces with single point cane and prosthesis independently  (NEW Target Date: 04/12/14)   Baseline Baseline on 12/26/13: Patient recieved his first prosthesis on 12/25/13. Patient is unable to ambulate on uneven surfaces.   Time 8   Period Weeks   Status On-going  icy ground conditions outside.   PT LONG TERM GOAL #5   Title Negotiate ramp, curb, stairs (1 rail) with single point cane and prosthesis independently  (NEW Target Date: 04/12/14)   Baseline 02/14/2014: mostly met at RW level. Still needs cues at time placing him at supervision level with mobility.   Time 8   Period Weeks   Status On-going   PT LONG TERM GOAL #6   Title Perform standing activities for >20 minutes with prosthesis with no pain or discomfort (Target Date:02/15/14)   Baseline 02/14/2014: met.   Time 8   Period Weeks   Status Achieved   PT LONG TERM GOAL #7   Title Berg Balance test score with prosthesis >/= 45/56  (NEW Target Date: 04/12/14)   Baseline 02/15/2104: Berg score was 38/56.   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 He currently would like to  start working on Saturdays & Sundays. PT took detailed  description of work duties /day. He will need to work on balance at [employer], walking pushing rolling cart and lifting /moving items. He has potential to perform these tasks with training.   Pt will benefit from skilled therapeutic intervention in order to improve on the following deficits Abnormal gait;Difficulty walking;Decreased endurance;Decreased activity  tolerance;Decreased balance;Decreased mobility;Decreased strength;Decreased knowledge of use of DME;Other (comment)  prosthetic dependency   Rehab Potential Good   PT Frequency 2x / week   PT Duration 8 weeks   PT Treatment/Interventions Therapeutic activities;ADLs/Self Care Home Management;Patient/family education;DME Instruction;Therapeutic exercise;Gait training;Balance training;Manual techniques;Stair training;Neuromuscular re-education;Energy conservation;Functional mobility training;Other (comment)  prosthetic training   PT Next Visit Plan Work on job related tasks. work towards updated STGs (check by 2/19). Forearm crutches outside if weather permits. work with single point cane on level surfaces. Work on Building control surveyor on stairs.   Consulted and Agree with Plan of Care Patient        Problem List Patient Active Problem List   Diagnosis Date Noted  . Acute blood loss anemia 06/25/2013  . Hyperglycemia 06/07/2013  . Arm DVT (deep venous thromboembolism), acute 06/07/2013  . ESRD on dialysis 06/05/2013  . Encephalopathy, toxic 06/03/2013  . Cellulitis and abscess of leg 06/03/2013  . Anemia in chronic kidney disease 06/03/2013  . Arm edema 06/03/2013  . Leg abscess 06/03/2013  . Severe sepsis 06/03/2013  . Septic shock 06/03/2013  . Septic arthritis of ankle or foot, right 06/03/2013  . Abscess of right lower leg 06/02/2013  . Wrist lump 07/05/2012  . Swelling of limb-Left index and middle fingers 07/05/2012  . Aftercare following surgery of the circulatory system, [place] 07/05/2012  . Tingling-left wrist and some pain. 07/05/2012  . Pulmonary edema 05/04/2012  . Acute respiratory failure 05/04/2012  . CKD (chronic kidney disease) stage 5, GFR less than 15 ml/min 05/04/2012  . Hyperkalemia 05/04/2012  . Pre-operative cardiovascular examination 03/15/2012  . Iron deficiency anemia 09/22/2010  . HTN (hypertension) 09/22/2010  . Hyperlipidemia 09/22/2010  . Gout  09/22/2010  . CRI (chronic renal insufficiency) 09/22/2010  . Pulmonary embolism, bilateral 09/22/2010    Jamey Reas PT, DPT 03/05/2014, 12:28 PM  [facility] [address] [place], [place], [address] Phone: [phone]   Fax:  [phone]

## 2014-03-07 ENCOUNTER — Ambulatory Visit: Payer: BLUE CROSS/BLUE SHIELD | Admitting: Physical Therapy

## 2014-03-07 ENCOUNTER — Encounter: Payer: Self-pay | Admitting: Physical Therapy

## 2014-03-07 DIAGNOSIS — R269 Unspecified abnormalities of gait and mobility: Secondary | ICD-10-CM

## 2014-03-07 DIAGNOSIS — Z89611 Acquired absence of right leg above knee: Secondary | ICD-10-CM | POA: Diagnosis not present

## 2014-03-07 DIAGNOSIS — R6889 Other general symptoms and signs: Secondary | ICD-10-CM

## 2014-03-07 NOTE — Therapy (Signed)
[facility] [address] [place], [place], [address] Phone: [phone]   Fax:  [phone]  Physical Therapy Treatment  Patient Details  Name: Steven Logan MRN: [id number] Date of Birth: 11-24-1970 Referring Provider:  Tivis Ringer, MD  Encounter Date: 03/07/2014      PT End of Session - 03/07/14 1019    Visit Number (p) 19   Number of Visits (p) 30   Date for PT Re-Evaluation (p) 04/12/14   PT Start Time (p) 0930   PT Stop Time (p) 1018   PT Time Calculation (min) (p) 48 min   Equipment Utilized During Treatment (p) Gait belt   Activity Tolerance (p) Patient tolerated treatment well   Behavior During Therapy (p) WFL for tasks assessed/performed      Past Medical History  Diagnosis Date  . Eczema   . Morbid obesity   . Gout   . HTN (hypertension)   . Dyslipidemia   . Pulmonary embolism     3  in  lungs  at  one time...  . Abscess     great toe  . Pancreatitis   . Arthritis   . Depression   . Diabetes mellitus   . Anemia   . Pneumonia     2-3 years ago  . Shortness of breath     ?? chest  cold he has now.  10/8- no SOB  . Renal hypertension     Hemo started  Sept 2014- MWF  . Chronic kidney disease     esrd    Past Surgical History  Procedure Laterality Date  . None    . Av fistula placement Left 04/07/2012    Procedure: ARTERIOVENOUS (AV) FISTULA CREATION;  Surgeon: Angelia Mould, MD;  Location: [facility];  Service: Vascular;  Laterality: Left;  . Ligation of competing branches of arteriovenous fistula Left 09/19/2012    Procedure: LIGATION OF COMPETING BRANCHES OF LEFT ARM ARTERIOVENOUS FISTULA; Vein angioplasty;  Surgeon: Angelia Mould, MD;  Location: [facility];  Service: Vascular;  Laterality: Left;  . Insertion of dialysis catheter Right 09/19/2012    Procedure: INSERTION OF DIALYSIS CATHETER;  Surgeon: Angelia Mould, MD;  Location: [place];  Service: Vascular;   Laterality: Right;  . I&d extremity Right 06/02/2013    Procedure: IRRIGATION AND DEBRIDEMENT ARTHROSCOPIC RIGHT ANKLE;  Surgeon: Augustin Schooling, MD;  Location: [place];  Service: Orthopedics;  Laterality: Right;  . I&d extremity Right 06/04/2013    Procedure: IRRIGATION AND DEBRIDEMENT RIGHT FOOT/ANKLE;  Surgeon: Newt Minion, MD;  Location: [place];  Service: Orthopedics;  Laterality: Right;  . I&d extremity Right 06/06/2013    Procedure: IRRIGATION AND DEBRIDEMENT EXTREMITY;  Surgeon: Newt Minion, MD;  Location: [place];  Service: Orthopedics;  Laterality: Right;  . Amputation Right 06/12/2013    Procedure: Transtibial Amputation;  Surgeon: Newt Minion, MD;  Location: [place];  Service: Orthopedics;  Laterality: Right;  . I&d extremity Right 06/24/2013    Procedure: IRRIGATION AND DEBRIDEMENT and Revsion of TRANSTIBIAL AMPUTATION;  Surgeon: Newt Minion, MD;  Location: [place];  Service: Orthopedics;  Laterality: Right;  . Amputation Right 06/27/2013    Procedure: AMPUTATION ABOVE KNEE;  Surgeon: Newt Minion, MD;  Location: [facility];  Service: Orthopedics;  Laterality: Right;  Right Above Knee Amputation  . Fistulogram Left 08/07/2012    Procedure: FISTULOGRAM;  Surgeon: Angelia Mould, MD;  Location: [facility] CATH LAB;  Service: Cardiovascular;  Laterality: Left;  arm    There were no vitals taken for this visit.  Visit Diagnosis:  Abnormality of gait  Activity intolerance  Status post above knee amputation of right lower extremity      Subjective Assessment - 03/07/14 0943    Symptoms Using rolling walker over crutches as he does not fell steady. He stood to [employer], walk to [employer] and empties light trash using rolling walker.    Currently in Pain? No/denies     Prosthetic Training: PT instructed patient in crutches vs rolling walker to progress mobility & balance. PT demo step length and changes with gait velocity. Patient ambulated >500' with forearm crutches with tactile / verbal cues on  using pelvis to advance prosthesis, step length, and posture: worked on scanning right /left, up/down and diagonals and increasing speed with minimal assist / guard.  Backwards gait with 2 crutches with minimal contact assist. Stairs with 2 rails: PT demo / instructed with verbal cueing to descend step-to using C-leg hydraulics - patient performed with 2 rails with supervision. Stepping over obstacles: PT demo / instructed technique - using forearm crutches stepped over 5" high  X 4" wide beam forward facing and 8" high X 4" wide side stepping with supervision / cues. PT demo technique to lift object using hydaulics and prosthesis including safe set-up with 2 chairs to practice at home. To reach floor patient has to have single UE support and without UE support only to knee height.                        PT Education - 03/07/14 0930    Education provided Yes   Education Details using hydraulics to descend stairs, using crutches over RW if builds confidence with family, use of backpack to carry items, lifting,    Person(s) Educated Patient   Methods Explanation   Comprehension Verbalized understanding          PT Short Term Goals - 02/26/14 0930    PT SHORT TERM GOAL #1   Title Verbalize prpoper prosthetic care except cues to adjust the number of ply socks (Target Date: 01/17/14)   Baseline MET 01/15/14   Time 4   Period Weeks   Status Achieved   PT SHORT TERM GOAL #2   Title Tolerate wear of prosthesis >10 hours without change in skin integrity nor undue tenderness (Target Date: 01/17/14)   Baseline MET 01/15/14   Time 4   Period Weeks   Status Achieved   PT SHORT TERM GOAL #3   Title Ambulate 373f with rolling walker and prosthesis modified  (Target Date: 01/17/14)   Time 4   Period Weeks   Status Not Met   PT SHORT TERM GOAL #4   Title Negotiate ramp, curb, stairs (1 rail) with rolling walker and prosthesis modified indepent (Target Date: 01/17/14)   Time 4    Period Weeks   Status Not Met   PT SHORT TERM GOAL #5   Title negotiates ramp & curb with forearm crutches & prosthesis modified independent. (Target Date: 03/15/14)   Status New   Additional Short Term Goals   Additional Short Term Goals Yes   PT SHORT TERM GOAL #6   Title descends stairs with 2 rails using hydraulics of prosthesis with cues only. (Target Date: 03/15/14)   Status New   PT SHORT TERM GOAL #7   Title ambulates 150' with 1 forearm cructh & prosthesis with  minimal gaurd. (Target Date: 03/15/14)   Status New   PT SHORT TERM GOAL #8   Title picks up object from floor with supervision safely (Target Date: 03/15/14)   Status New           PT Long Term Goals - 02/26/14 0930    PT LONG TERM GOAL #1   Title Demonstrate and verbalize proper prosthetic care to enable safe utilization of the prosthesis (NEW Target Date: 04/12/14)   Baseline 02/14/2014: Pt independent with cleaning and wear of prosthesis. Still needing cues on sock management and knee use/control.   Time 8   Period Weeks   Status On-going   PT LONG TERM GOAL #2   Title Tolerate wear of prosthesis for >90% of awake hours without change in skin integrity nor undue tenderness.  (Target Date: 02/15/14)   Baseline 02/14/2014: Goal met.   Time 8   Period Weeks   Status Achieved   PT LONG TERM GOAL #3   Title Ambulate 542f with single point cane and prosthesis independently  (NEW Target Date: 04/12/14)   Baseline 02/14/2014: Goal met at RW level when set as LRAD   Time 8   Period Weeks   Status On-going   PT LONG TERM GOAL #4   Title Ambulate 2079fon uneven (grass) surfaces with single point cane and prosthesis independently  (NEW Target Date: 04/12/14)   Baseline Baseline on 12/26/13: Patient recieved his first prosthesis on 12/25/13. Patient is unable to ambulate on uneven surfaces.   Time 8   Period Weeks   Status On-going  icy ground conditions outside.   PT LONG TERM GOAL #5   Title Negotiate ramp, curb,  stairs (1 rail) with single point cane and prosthesis independently  (NEW Target Date: 04/12/14)   Baseline 02/14/2014: mostly met at RW level. Still needs cues at time placing him at supervision level with mobility.   Time 8   Period Weeks   Status On-going   PT LONG TERM GOAL #6   Title Perform standing activities for >20 minutes with prosthesis with no pain or discomfort (Target Date:02/15/14)   Baseline 02/14/2014: met.   Time 8   Period Weeks   Status Achieved   PT LONG TERM GOAL #7   Title Berg Balance test score with prosthesis >/= 45/56  (NEW Target Date: 04/12/14)   Baseline 02/15/2104: Berg score was 38/56.   Time 8   Period Weeks   Status On-going               Plan - 03/07/14 0930    Clinical Impression Statement Patient improved ability to scan while walking and increasing speed. He had difficulty with reaching towards floor. Patient will progress gait and balance more with crutches than RW.   Pt will benefit from skilled therapeutic intervention in order to improve on the following deficits Abnormal gait;Difficulty walking;Decreased endurance;Decreased activity tolerance;Decreased balance;Decreased mobility;Decreased strength;Decreased knowledge of use of DME;Other (comment)  prosthetic dependency   Rehab Potential Good   PT Frequency 2x / week   PT Duration 8 weeks   PT Treatment/Interventions Therapeutic activities;ADLs/Self Care Home Management;Patient/family education;DME Instruction;Therapeutic exercise;Gait training;Balance training;Manual techniques;Stair training;Neuromuscular re-education;Energy conservation;Functional mobility training;Other (comment)  prosthetic training   PT Next Visit Plan DO G CODE.     Work on job related tasks. work towards updated STGs (check by 2/19). Forearm crutches outside if weather permits. work with single point cane on level surfaces. Work on enBuilding control surveyorn stairs.   Consulted  and Agree with Plan of Care Patient         Problem List Patient Active Problem List   Diagnosis Date Noted  . Acute blood loss anemia 06/25/2013  . Hyperglycemia 06/07/2013  . Arm DVT (deep venous thromboembolism), acute 06/07/2013  . ESRD on dialysis 06/05/2013  . Encephalopathy, toxic 06/03/2013  . Cellulitis and abscess of leg 06/03/2013  . Anemia in chronic kidney disease 06/03/2013  . Arm edema 06/03/2013  . Leg abscess 06/03/2013  . Severe sepsis 06/03/2013  . Septic shock 06/03/2013  . Septic arthritis of ankle or foot, right 06/03/2013  . Abscess of right lower leg 06/02/2013  . Wrist lump 07/05/2012  . Swelling of limb-Left index and middle fingers 07/05/2012  . Aftercare following surgery of the circulatory system, [place] 07/05/2012  . Tingling-left wrist and some pain. 07/05/2012  . Pulmonary edema 05/04/2012  . Acute respiratory failure 05/04/2012  . CKD (chronic kidney disease) stage 5, GFR less than 15 ml/min 05/04/2012  . Hyperkalemia 05/04/2012  . Pre-operative cardiovascular examination 03/15/2012  . Iron deficiency anemia 09/22/2010  . HTN (hypertension) 09/22/2010  . Hyperlipidemia 09/22/2010  . Gout 09/22/2010  . CRI (chronic renal insufficiency) 09/22/2010  . Pulmonary embolism, bilateral 09/22/2010    Jamey Reas PT, DPT Physical Therapist Specializing in Prosthetics 03/07/2014, 5:09 PM  [facility] [address] [place], [place], [address] Phone: [phone]   Fax:  [phone]

## 2014-03-12 ENCOUNTER — Encounter: Payer: Self-pay | Admitting: Physical Therapy

## 2014-03-12 ENCOUNTER — Ambulatory Visit: Payer: BLUE CROSS/BLUE SHIELD | Admitting: Physical Therapy

## 2014-03-12 DIAGNOSIS — R269 Unspecified abnormalities of gait and mobility: Secondary | ICD-10-CM

## 2014-03-12 DIAGNOSIS — R6889 Other general symptoms and signs: Secondary | ICD-10-CM

## 2014-03-12 DIAGNOSIS — Z89611 Acquired absence of right leg above knee: Secondary | ICD-10-CM | POA: Diagnosis not present

## 2014-03-12 NOTE — Therapy (Signed)
[facility] [address] [place], [place], [address] Phone: [phone]   Fax:  [phone]  Physical Therapy Treatment  Patient Details  Name: Steven Logan MRN: [id number] Date of Birth: [birth year] Referring Provider:  Tivis Ringer, MD  Encounter Date: 03/12/2014      PT End of Session - 03/12/14 0809    Visit Number 20   Number of Visits 30   Date for PT Re-Evaluation 04/12/14   PT Start Time 0805   PT Stop Time 0845   PT Time Calculation (min) 40 min   Equipment Utilized During Treatment Gait belt   Activity Tolerance Patient tolerated treatment well   Behavior During Therapy [facility] for tasks assessed/performed      Past Medical History  Diagnosis Date  . Eczema   . Morbid obesity   . Gout   . HTN (hypertension)   . Dyslipidemia   . Pulmonary embolism     3  in  lungs  at  one time...  . Abscess     great toe  . Pancreatitis   . Arthritis   . Depression   . Diabetes mellitus   . Anemia   . Pneumonia     2-3 years ago  . Shortness of breath     ?? chest  cold he has now.  10/8- no SOB  . Renal hypertension     Hemo started  Sept 2014- MWF  . Chronic kidney disease     esrd    Past Surgical History  Procedure Laterality Date  . None    . Av fistula placement Left 04/07/2012    Procedure: ARTERIOVENOUS (AV) FISTULA CREATION;  Surgeon: Angelia Mould, MD;  Location: [place];  Service: Vascular;  Laterality: Left;  . Ligation of competing branches of arteriovenous fistula Left 09/19/2012    Procedure: LIGATION OF COMPETING BRANCHES OF LEFT ARM ARTERIOVENOUS FISTULA; Vein angioplasty;  Surgeon: Angelia Mould, MD;  Location: [place];  Service: Vascular;  Laterality: Left;  . Insertion of dialysis catheter Right 09/19/2012    Procedure: INSERTION OF DIALYSIS CATHETER;  Surgeon: Angelia Mould, MD;  Location: [place];  Service: Vascular;  Laterality: Right;  . I&d extremity Right  06/02/2013    Procedure: IRRIGATION AND DEBRIDEMENT ARTHROSCOPIC RIGHT ANKLE;  Surgeon: Augustin Schooling, MD;  Location: [place];  Service: Orthopedics;  Laterality: Right;  . I&d extremity Right 06/04/2013    Procedure: IRRIGATION AND DEBRIDEMENT RIGHT FOOT/ANKLE;  Surgeon: Newt Minion, MD;  Location: [place];  Service: Orthopedics;  Laterality: Right;  . I&d extremity Right 06/06/2013    Procedure: IRRIGATION AND DEBRIDEMENT EXTREMITY;  Surgeon: Newt Minion, MD;  Location: [place];  Service: Orthopedics;  Laterality: Right;  . Amputation Right 06/12/2013    Procedure: Transtibial Amputation;  Surgeon: Newt Minion, MD;  Location: [place];  Service: Orthopedics;  Laterality: Right;  . I&d extremity Right 06/24/2013    Procedure: IRRIGATION AND DEBRIDEMENT and Revsion of TRANSTIBIAL AMPUTATION;  Surgeon: Newt Minion, MD;  Location: [place];  Service: Orthopedics;  Laterality: Right;  . Amputation Right 06/27/2013    Procedure: AMPUTATION ABOVE KNEE;  Surgeon: Newt Minion, MD;  Location: [place];  Service: Orthopedics;  Laterality: Right;  Right Above Knee Amputation  . Fistulogram Left 08/07/2012    Procedure: FISTULOGRAM;  Surgeon: Angelia Mould, MD;  Location: [facility] CATH LAB;  Service: Cardiovascular;  Laterality: Left;  arm  There were no vitals taken for this visit.  Visit Diagnosis:  Abnormality of gait  Activity intolerance  Status post above knee amputation of right lower extremity      Subjective Assessment - 03/12/14 0808    Symptoms No new complaints. No falls or pain.   Currently in Pain? No/denies   Pain Score 0-No pain         [facility] Adult PT Treatment/Exercise - 03/12/14 0810    Transfers   Sit to Stand 5: Supervision;With armrests;From chair/3-in-1;With upper extremity assist   Stand to Sit 5: Supervision;With upper extremity assist;With armrests;To chair/3-in-1   Ambulation/Gait   Ambulation/Gait Yes   Ambulation/Gait Assistance 5: Supervision   Ambulation/Gait  Assistance Details cues on posture, control of prosthesis with advancement/pelvic use with advancment of prosthesis. Pt noted to have toe out with gait. Prosthesis redonned/repositioned with slight improvement. Pt to have prosthetist look at it today during his appointment.              Ambulation Distance (Feet) 600 Feet  x1, 450 x1   Assistive device Crutches;Prosthesis   Gait Pattern Step-through pattern;Decreased stride length;Narrow base of support   Ambulation Surface Level;Indoor   Stairs Yes   Stairs Assistance 5: Supervision;4: Min assist   Stairs Assistance Details (indicate cue type and reason) focued on use of knee hydraulics with descending stairs, cues needed   Stair Management Technique Two rails;Alternating pattern;Step to pattern;Forwards   Number of Stairs 4  x 5 reps   Prosthetics   Prosthetic Care Comments  See's Gerald Stabs today for adjustments to prosthesis.                             Current prosthetic wear tolerance (days/week)  7 days /wk   Current prosthetic wear tolerance (#hours/day)  all awake hours  drying 2 x day   Residual limb condition  intact per patient. heat rash getting better   Education Provided Residual limb care;Correct ply sock adjustment;Proper wear schedule/adjustment   Person(s) Educated Patient   Education Method Explanation   Education Method Verbalized understanding   Donning Prosthesis Modified independent (device/increased time)   Doffing Prosthesis Modified independent (device/increased time)           PT Short Term Goals - 02/26/14 0930    PT SHORT TERM GOAL #1   Title Verbalize prpoper prosthetic care except cues to adjust the number of ply socks (Target Date: 01/17/14)   Baseline MET 01/15/14   Time 4   Period Weeks   Status Achieved   PT SHORT TERM GOAL #2   Title Tolerate wear of prosthesis >10 hours without change in skin integrity nor undue tenderness (Target Date: 01/17/14)   Baseline MET 01/15/14   Time 4   Period Weeks    Status Achieved   PT SHORT TERM GOAL #3   Title Ambulate 319f with rolling walker and prosthesis modified  (Target Date: 01/17/14)   Time 4   Period Weeks   Status Not Met   PT SHORT TERM GOAL #4   Title Negotiate ramp, curb, stairs (1 rail) with rolling walker and prosthesis modified indepent (Target Date: 01/17/14)   Time 4   Period Weeks   Status Not Met   PT SHORT TERM GOAL #5   Title negotiates ramp & curb with forearm crutches & prosthesis modified independent. (Target Date: 03/15/14)   Status New   Additional Short Term Goals   Additional Short  Term Goals Yes   PT SHORT TERM GOAL #6   Title descends stairs with 2 rails using hydraulics of prosthesis with cues only. (Target Date: 03/15/14)   Status New   PT SHORT TERM GOAL #7   Title ambulates 150' with 1 forearm cructh & prosthesis with minimal gaurd. (Target Date: 03/15/14)   Status New   PT SHORT TERM GOAL #8   Title picks up object from floor with supervision safely (Target Date: 03/15/14)   Status New           PT Long Term Goals - 02/26/14 0930    PT LONG TERM GOAL #1   Title Demonstrate and verbalize proper prosthetic care to enable safe utilization of the prosthesis (NEW Target Date: 04/12/14)   Baseline 02/14/2014: Pt independent with cleaning and wear of prosthesis. Still needing cues on sock management and knee use/control.   Time 8   Period Weeks   Status On-going   PT LONG TERM GOAL #2   Title Tolerate wear of prosthesis for >90% of awake hours without change in skin integrity nor undue tenderness.  (Target Date: 02/15/14)   Baseline 02/14/2014: Goal met.   Time 8   Period Weeks   Status Achieved   PT LONG TERM GOAL #3   Title Ambulate 567f with single point cane and prosthesis independently  (NEW Target Date: 04/12/14)   Baseline 02/14/2014: Goal met at RW level when set as LRAD   Time 8   Period Weeks   Status On-going   PT LONG TERM GOAL #4   Title Ambulate 2076fon uneven (grass) surfaces with  single point cane and prosthesis independently  (NEW Target Date: 04/12/14)   Baseline Baseline on 12/26/13: Patient recieved his first prosthesis on 12/25/13. Patient is unable to ambulate on uneven surfaces.   Time 8   Period Weeks   Status On-going  icy ground conditions outside.   PT LONG TERM GOAL #5   Title Negotiate ramp, curb, stairs (1 rail) with single point cane and prosthesis independently  (NEW Target Date: 04/12/14)   Baseline 02/14/2014: mostly met at RW level. Still needs cues at time placing him at supervision level with mobility.   Time 8   Period Weeks   Status On-going   PT LONG TERM GOAL #6   Title Perform standing activities for >20 minutes with prosthesis with no pain or discomfort (Target Date:02/15/14)   Baseline 02/14/2014: met.   Time 8   Period Weeks   Status Achieved   PT LONG TERM GOAL #7   Title Berg Balance test score with prosthesis >/= 45/56  (NEW Target Date: 04/12/14)   Baseline 02/15/2104: Berg score was 38/56.   Time 8   Period Weeks   Status On-going          Plan - 03/12/14 0809    Clinical Impression Statement Pt making steady progress toward goals.  Pt hoping to try driving this week. Provided with information on portalbe left foot accelerator.   Pt will benefit from skilled therapeutic intervention in order to improve on the following deficits Abnormal gait;Difficulty walking;Decreased endurance;Decreased activity tolerance;Decreased balance;Decreased mobility;Decreased strength;Decreased knowledge of use of DME;Other (comment)  prosthetic dependency   Rehab Potential Good   PT Frequency 2x / week   PT Duration 8 weeks   PT Treatment/Interventions Therapeutic activities;ADLs/Self Care Home Management;Patient/family education;DME Instruction;Therapeutic exercise;Gait training;Balance training;Manual techniques;Stair training;Neuromuscular re-education;Energy conservation;Functional mobility training;Other (comment)  prosthetic training   PT Next  Visit Plan Assess  STG's. work on job related tasks. forearm crutches outside if weather permits. single point cane on level surfaces   Consulted and Agree with Plan of Care Patient        Problem List Patient Active Problem List   Diagnosis Date Noted  . Acute blood loss anemia 06/25/2013  . Hyperglycemia 06/07/2013  . Arm DVT (deep venous thromboembolism), acute 06/07/2013  . ESRD on dialysis 06/05/2013  . Encephalopathy, toxic 06/03/2013  . Cellulitis and abscess of leg 06/03/2013  . Anemia in chronic kidney disease 06/03/2013  . Arm edema 06/03/2013  . Leg abscess 06/03/2013  . Severe sepsis 06/03/2013  . Septic shock 06/03/2013  . Septic arthritis of ankle or foot, right 06/03/2013  . Abscess of right lower leg 06/02/2013  . Wrist lump 07/05/2012  . Swelling of limb-Left index and middle fingers 07/05/2012  . Aftercare following surgery of the circulatory system, [place] 07/05/2012  . Tingling-left wrist and some pain. 07/05/2012  . Pulmonary edema 05/04/2012  . Acute respiratory failure 05/04/2012  . CKD (chronic kidney disease) stage 5, GFR less than 15 ml/min 05/04/2012  . Hyperkalemia 05/04/2012  . Pre-operative cardiovascular examination 03/15/2012  . Iron deficiency anemia 09/22/2010  . HTN (hypertension) 09/22/2010  . Hyperlipidemia 09/22/2010  . Gout 09/22/2010  . CRI (chronic renal insufficiency) 09/22/2010  . Pulmonary embolism, bilateral 09/22/2010    Willow Ora 03/12/2014, 2:19 PM  Willow Ora, PTA, [facility] [address] [place], [place] [address] [phone] 03/12/2014, 2:20 PM

## 2014-03-19 ENCOUNTER — Encounter: Payer: Self-pay | Admitting: Physical Therapy

## 2014-03-19 ENCOUNTER — Ambulatory Visit: Payer: BLUE CROSS/BLUE SHIELD | Admitting: Physical Therapy

## 2014-03-19 DIAGNOSIS — R269 Unspecified abnormalities of gait and mobility: Secondary | ICD-10-CM

## 2014-03-19 DIAGNOSIS — R6889 Other general symptoms and signs: Secondary | ICD-10-CM

## 2014-03-19 DIAGNOSIS — Z89611 Acquired absence of right leg above knee: Secondary | ICD-10-CM | POA: Diagnosis not present

## 2014-03-19 NOTE — Therapy (Signed)
[facility] [address] [place], [place], [address] Phone: [phone]   Fax:  [phone]  Physical Therapy Treatment  Patient Details  Name: Steven Logan MRN: [id number] Date of Birth: Jun 04, 1970 Referring Provider:  Tivis Ringer, MD  Encounter Date: 03/19/2014      PT End of Session - 03/19/14 0927    Visit Number 21   Number of Visits 30   Date for PT Re-Evaluation 04/12/14   PT Start Time 0755   PT Stop Time 0848   PT Time Calculation (min) 53 min   Equipment Utilized During Treatment Gait belt   Activity Tolerance Patient tolerated treatment well   Behavior During Therapy [facility] for tasks assessed/performed      Past Medical History  Diagnosis Date  . Eczema   . Morbid obesity   . Gout   . HTN (hypertension)   . Dyslipidemia   . Pulmonary embolism     3  in  lungs  at  one time...  . Abscess     great toe  . Pancreatitis   . Arthritis   . Depression   . Diabetes mellitus   . Anemia   . Pneumonia     2-3 years ago  . Shortness of breath     ?? chest  cold he has now.  10/8- no SOB  . Renal hypertension     Hemo started  Sept 2014- MWF  . Chronic kidney disease     esrd    Past Surgical History  Procedure Laterality Date  . None    . Av fistula placement Left 04/07/2012    Procedure: ARTERIOVENOUS (AV) FISTULA CREATION;  Surgeon: Angelia Mould, MD;  Location: [place];  Service: Vascular;  Laterality: Left;  . Ligation of competing branches of arteriovenous fistula Left 09/19/2012    Procedure: LIGATION OF COMPETING BRANCHES OF LEFT ARM ARTERIOVENOUS FISTULA; Vein angioplasty;  Surgeon: Angelia Mould, MD;  Location: [place];  Service: Vascular;  Laterality: Left;  . Insertion of dialysis catheter Right 09/19/2012    Procedure: INSERTION OF DIALYSIS CATHETER;  Surgeon: Angelia Mould, MD;  Location: [place];  Service: Vascular;  Laterality: Right;  . I&d extremity Right  06/02/2013    Procedure: IRRIGATION AND DEBRIDEMENT ARTHROSCOPIC RIGHT ANKLE;  Surgeon: Augustin Schooling, MD;  Location: [place];  Service: Orthopedics;  Laterality: Right;  . I&d extremity Right 06/04/2013    Procedure: IRRIGATION AND DEBRIDEMENT RIGHT FOOT/ANKLE;  Surgeon: Newt Minion, MD;  Location: [place];  Service: Orthopedics;  Laterality: Right;  . I&d extremity Right 06/06/2013    Procedure: IRRIGATION AND DEBRIDEMENT EXTREMITY;  Surgeon: Newt Minion, MD;  Location: [place];  Service: Orthopedics;  Laterality: Right;  . Amputation Right 06/12/2013    Procedure: Transtibial Amputation;  Surgeon: Newt Minion, MD;  Location: [place];  Service: Orthopedics;  Laterality: Right;  . I&d extremity Right 06/24/2013    Procedure: IRRIGATION AND DEBRIDEMENT and Revsion of TRANSTIBIAL AMPUTATION;  Surgeon: Newt Minion, MD;  Location: [place];  Service: Orthopedics;  Laterality: Right;  . Amputation Right 06/27/2013    Procedure: AMPUTATION ABOVE KNEE;  Surgeon: Newt Minion, MD;  Location: [place];  Service: Orthopedics;  Laterality: Right;  Right Above Knee Amputation  . Fistulogram Left 08/07/2012    Procedure: FISTULOGRAM;  Surgeon: Angelia Mould, MD;  Location: [facility] CATH LAB;  Service: Cardiovascular;  Laterality: Left;  arm  There were no vitals taken for this visit.  Visit Diagnosis:  Abnormality of gait  Activity intolerance  Status post above knee amputation of right lower extremity      Subjective Assessment - 03/19/14 0800    Symptoms Going to prosthetist today and expects to get a new prosthesis. Fell 2 days ago trying to do laundry when picking up something from ground. Did not get hurt, sat on buttocks.   Currently in Pain? No/denies     Prosthetic Training:  PT instructed patient in options to driving with left foot (crossover vs left foot accelerator). Patient verbalized understanding. PT instructed in rotator & knee axes equality, to discuss with prosthetist. Patient  verbalized understanding.  PT demo / instructed in picking up objects off floor with prosthesis abducted and knee extended: Patient return demo with supervision. Patient appears safe with RW close. PT demo / instructed in engaging prosthetic knee with sit to stand and stand to sit. Patient return demo understanding from chair without armrests with verbal cues. PT demo / instructed getting on /off floor pushing on chair. Patient performed floor transfer with minimal assist to stabilize prosthesis initially and supervision by 8th transfer. Patient ambulated 200' with 2 crutches with verbal cues on deviations only. Patient negotiated stairs with 2 rails descending using hydraulics with cues only (verbal & occ. Demo) Patient ambulated 150' with 1 crutch with minimal guard /cues on pelvic motion /position. Patient has LBQC at home & PT instructed with demo proper set-up. Patient negotiated ramp & curb with 2 crutches with supervision / no assist patient reports just not comfortable.                        PT Education - 03/19/14 [phone]    Education provided Yes   Education Details picking up object from floor, left foot driving options, floor transfer, LBQC set-up   Person(s) Educated Patient   Methods Explanation;Demonstration   Comprehension Verbalized understanding;Returned demonstration;Need further instruction          PT Short Term Goals - 03/19/14 0929    PT SHORT TERM GOAL #1   Title Verbalize prpoper prosthetic care except cues to adjust the number of ply socks (Target Date: 01/17/14)   Baseline MET 01/15/14   Time 4   Period Weeks   Status Achieved   PT SHORT TERM GOAL #2   Title Tolerate wear of prosthesis >10 hours without change in skin integrity nor undue tenderness (Target Date: 01/17/14)   Baseline MET 01/15/14   Time 4   Period Weeks   Status Achieved   PT SHORT TERM GOAL #3   Title Ambulate 32f with rolling walker and prosthesis modified  (Target Date:  01/17/14)   Baseline MET 03/19/14   Time 4   Period Weeks   Status Achieved   PT SHORT TERM GOAL #4   Title Negotiate ramp, curb, stairs (1 rail) with rolling walker and prosthesis modified indepent (Target Date: 01/17/14)   Baseline MET 03/19/14   Time 4   Period Weeks   Status Achieved   PT SHORT TERM GOAL #5   Title negotiates ramp & curb with forearm crutches & prosthesis modified independent. (Target Date: 03/15/14)   Baseline MET 03/19/14   Status Achieved   PT SHORT TERM GOAL #6   Title descends stairs with 2 rails using hydraulics of prosthesis with cues only. (Target Date: 03/15/14)   Baseline MET 03/19/14   Status Achieved   PT  SHORT TERM GOAL #7   Title ambulates 150' with 1 forearm cructh & prosthesis with minimal gaurd. (Target Date: 03/15/14)   Baseline MET 03/19/14   Status Achieved   PT SHORT TERM GOAL #8   Title picks up object from floor with supervision safely (Target Date: 03/15/14)   Baseline MET 03/19/14   Status Achieved           PT Long Term Goals - 02/26/14 0930    PT LONG TERM GOAL #1   Title Demonstrate and verbalize proper prosthetic care to enable safe utilization of the prosthesis (NEW Target Date: 04/12/14)   Baseline 02/14/2014: Pt independent with cleaning and wear of prosthesis. Still needing cues on sock management and knee use/control.   Time 8   Period Weeks   Status On-going   PT LONG TERM GOAL #2   Title Tolerate wear of prosthesis for >90% of awake hours without change in skin integrity nor undue tenderness.  (Target Date: 02/15/14)   Baseline 02/14/2014: Goal met.   Time 8   Period Weeks   Status Achieved   PT LONG TERM GOAL #3   Title Ambulate 547f with single point cane and prosthesis independently  (NEW Target Date: 04/12/14)   Baseline 02/14/2014: Goal met at RW level when set as LRAD   Time 8   Period Weeks   Status On-going   PT LONG TERM GOAL #4   Title Ambulate 2073fon uneven (grass) surfaces with single point cane and  prosthesis independently  (NEW Target Date: 04/12/14)   Baseline Baseline on 12/26/13: Patient recieved his first prosthesis on 12/25/13. Patient is unable to ambulate on uneven surfaces.   Time 8   Period Weeks   Status On-going  icy ground conditions outside.   PT LONG TERM GOAL #5   Title Negotiate ramp, curb, stairs (1 rail) with single point cane and prosthesis independently  (NEW Target Date: 04/12/14)   Baseline 02/14/2014: mostly met at RW level. Still needs cues at time placing him at supervision level with mobility.   Time 8   Period Weeks   Status On-going   PT LONG TERM GOAL #6   Title Perform standing activities for >20 minutes with prosthesis with no pain or discomfort (Target Date:02/15/14)   Baseline 02/14/2014: met.   Time 8   Period Weeks   Status Achieved   PT LONG TERM GOAL #7   Title Berg Balance test score with prosthesis >/= 45/56  (NEW Target Date: 04/12/14)   Baseline 02/15/2104: Berg score was 38/56.   Time 8   Period Weeks   Status On-going               Plan - 03/19/14 [id number]  Clinical Impression Statement Patient met all STGs. Patient should have better control with proper fitting (intimate) socket. Patient has better understanding how to pick up objects from floor and floor transfers.   Pt will benefit from skilled therapeutic intervention in order to improve on the following deficits Abnormal gait;Difficulty walking;Decreased endurance;Decreased activity tolerance;Decreased balance;Decreased mobility;Decreased strength;Decreased knowledge of use of DME;Other (comment)  prosthetic dependency   Rehab Potential Good   PT Frequency 2x / week   PT Duration 8 weeks   PT Treatment/Interventions Therapeutic activities;ADLs/Self Care Home Management;Patient/family education;DME Instruction;Therapeutic exercise;Gait training;Balance training;Manual techniques;Stair training;Neuromuscular re-education;Energy conservation;Functional mobility training;Other (comment)   prosthetic training   PT Next Visit Plan work on job related tasks. forearm crutches outside if weather permits. single point cane on  level surfaces   Consulted and Agree with Plan of Care Patient        Problem List Patient Active Problem List   Diagnosis Date Noted  . Acute blood loss anemia 06/25/2013  . Hyperglycemia 06/07/2013  . Arm DVT (deep venous thromboembolism), acute 06/07/2013  . ESRD on dialysis 06/05/2013  . Encephalopathy, toxic 06/03/2013  . Cellulitis and abscess of leg 06/03/2013  . Anemia in chronic kidney disease 06/03/2013  . Arm edema 06/03/2013  . Leg abscess 06/03/2013  . Severe sepsis 06/03/2013  . Septic shock 06/03/2013  . Septic arthritis of ankle or foot, right 06/03/2013  . Abscess of right lower leg 06/02/2013  . Wrist lump 07/05/2012  . Swelling of limb-Left index and middle fingers 07/05/2012  . Aftercare following surgery of the circulatory system, [place] 07/05/2012  . Tingling-left wrist and some pain. 07/05/2012  . Pulmonary edema 05/04/2012  . Acute respiratory failure 05/04/2012  . CKD (chronic kidney disease) stage 5, GFR less than 15 ml/min 05/04/2012  . Hyperkalemia 05/04/2012  . Pre-operative cardiovascular examination 03/15/2012  . Iron deficiency anemia 09/22/2010  . HTN (hypertension) 09/22/2010  . Hyperlipidemia 09/22/2010  . Gout 09/22/2010  . CRI (chronic renal insufficiency) 09/22/2010  . Pulmonary embolism, bilateral 09/22/2010    Jamey Reas PT, DPT 03/19/2014, 9:31 AM  [facility] [address] [place], [place], [address] Phone: [phone]   Fax:  [phone]

## 2014-03-21 ENCOUNTER — Ambulatory Visit: Payer: BLUE CROSS/BLUE SHIELD | Admitting: Physical Therapy

## 2014-03-26 ENCOUNTER — Ambulatory Visit: Payer: BLUE CROSS/BLUE SHIELD | Attending: Orthopedic Surgery | Admitting: Physical Therapy

## 2014-03-26 ENCOUNTER — Encounter: Payer: Self-pay | Admitting: Physical Therapy

## 2014-03-26 DIAGNOSIS — Z89611 Acquired absence of right leg above knee: Secondary | ICD-10-CM | POA: Insufficient documentation

## 2014-03-26 DIAGNOSIS — R5381 Other malaise: Secondary | ICD-10-CM | POA: Diagnosis not present

## 2014-03-26 DIAGNOSIS — R269 Unspecified abnormalities of gait and mobility: Secondary | ICD-10-CM | POA: Insufficient documentation

## 2014-03-26 DIAGNOSIS — R6889 Other general symptoms and signs: Secondary | ICD-10-CM

## 2014-03-26 NOTE — Therapy (Signed)
[facility] [address] [place], [place], [address] Phone: [phone]   Fax:  [phone]  Physical Therapy Treatment  Patient Details  Name: Steven Logan MRN: [id number] Date of Birth: 19-Jun-1970 Referring Provider:  Tivis Ringer, MD  Encounter Date: 03/26/2014      PT End of Session - 03/26/14 0845    Visit Number 22   Number of Visits 30   Date for PT Re-Evaluation 04/12/14   PT Start Time 0800   PT Stop Time 0845   PT Time Calculation (min) 45 min   Equipment Utilized During Treatment Gait belt   Activity Tolerance Patient tolerated treatment well   Behavior During Therapy [facility] for tasks assessed/performed      Past Medical History  Diagnosis Date  . Eczema   . Morbid obesity   . Gout   . HTN (hypertension)   . Dyslipidemia   . Pulmonary embolism     3  in  lungs  at  one time...  . Abscess     great toe  . Pancreatitis   . Arthritis   . Depression   . Diabetes mellitus   . Anemia   . Pneumonia     2-3 years ago  . Shortness of breath     ?? chest  cold he has now.  10/8- no SOB  . Renal hypertension     Hemo started  Sept 2014- MWF  . Chronic kidney disease     esrd    Past Surgical History  Procedure Laterality Date  . None    . Av fistula placement Left 04/07/2012    Procedure: ARTERIOVENOUS (AV) FISTULA CREATION;  Surgeon: Angelia Mould, MD;  Location: [place];  Service: Vascular;  Laterality: Left;  . Ligation of competing branches of arteriovenous fistula Left 09/19/2012    Procedure: LIGATION OF COMPETING BRANCHES OF LEFT ARM ARTERIOVENOUS FISTULA; Vein angioplasty;  Surgeon: Angelia Mould, MD;  Location: [place];  Service: Vascular;  Laterality: Left;  . Insertion of dialysis catheter Right 09/19/2012    Procedure: INSERTION OF DIALYSIS CATHETER;  Surgeon: Angelia Mould, MD;  Location: [place];  Service: Vascular;  Laterality: Right;  . I&d extremity Right  06/02/2013    Procedure: IRRIGATION AND DEBRIDEMENT ARTHROSCOPIC RIGHT ANKLE;  Surgeon: Augustin Schooling, MD;  Location: [place];  Service: Orthopedics;  Laterality: Right;  . I&d extremity Right 06/04/2013    Procedure: IRRIGATION AND DEBRIDEMENT RIGHT FOOT/ANKLE;  Surgeon: Newt Minion, MD;  Location: [place];  Service: Orthopedics;  Laterality: Right;  . I&d extremity Right 06/06/2013    Procedure: IRRIGATION AND DEBRIDEMENT EXTREMITY;  Surgeon: Newt Minion, MD;  Location: [place];  Service: Orthopedics;  Laterality: Right;  . Amputation Right 06/12/2013    Procedure: Transtibial Amputation;  Surgeon: Newt Minion, MD;  Location: [place];  Service: Orthopedics;  Laterality: Right;  . I&d extremity Right 06/24/2013    Procedure: IRRIGATION AND DEBRIDEMENT and Revsion of TRANSTIBIAL AMPUTATION;  Surgeon: Newt Minion, MD;  Location: [place];  Service: Orthopedics;  Laterality: Right;  . Amputation Right 06/27/2013    Procedure: AMPUTATION ABOVE KNEE;  Surgeon: Newt Minion, MD;  Location: [place];  Service: Orthopedics;  Laterality: Right;  Right Above Knee Amputation  . Fistulogram Left 08/07/2012    Procedure: FISTULOGRAM;  Surgeon: Angelia Mould, MD;  Location: [facility] CATH LAB;  Service: Cardiovascular;  Laterality: Left;  arm  There were no vitals taken for this visit.  Visit Diagnosis:  Abnormality of gait  Activity intolerance  Status post above knee amputation of right lower extremity      Subjective Assessment - 03/26/14 0824    Symptoms Prosthetist put strap on prosthesis instead of pin with new socket.   Currently in Pain? No/denies     Prosthetic Training: PT instructed while patient donned prosthesis in full tightening strap in standing with wt on prosthesis and adjusting ply socks to seat in socket proper depth. Patient verbalized understanding.  Patient ambulated 75' X 2 with single forearm crutch with SBA; Patient ambulated 15' on foam /compliant surface with 1 crutch  withCGA. Balance Activities in parallel bars with occasional touch to stabilize: head turns 4 motions- floor with eyes open & closed, foam with eyes open & closed. Alternate stepping forward, back & to side. Pick up pen from floor with prosthesis abducted, knee extended. Patient ambulated 100' with 1 crutch with SBA and negotiated ramp & curb with minimal A Sharlyne Pacas cues.                      PT Education - 03/26/14 1641    Education provided Yes   Education Details donning new suspension system, adjusting ply socks   Person(s) Educated Patient   Methods Explanation;Demonstration   Comprehension Verbalized understanding;Returned demonstration;Need further instruction          PT Short Term Goals - 03/19/14 0929    PT SHORT TERM GOAL #1   Title Verbalize prpoper prosthetic care except cues to adjust the number of ply socks (Target Date: 01/17/14)   Baseline MET 01/15/14   Time 4   Period Weeks   Status Achieved   PT SHORT TERM GOAL #2   Title Tolerate wear of prosthesis >10 hours without change in skin integrity nor undue tenderness (Target Date: 01/17/14)   Baseline MET 01/15/14   Time 4   Period Weeks   Status Achieved   PT SHORT TERM GOAL #3   Title Ambulate 326f with rolling walker and prosthesis modified  (Target Date: 01/17/14)   Baseline MET 03/19/14   Time 4   Period Weeks   Status Achieved   PT SHORT TERM GOAL #4   Title Negotiate ramp, curb, stairs (1 rail) with rolling walker and prosthesis modified indepent (Target Date: 01/17/14)   Baseline MET 03/19/14   Time 4   Period Weeks   Status Achieved   PT SHORT TERM GOAL #5   Title negotiates ramp & curb with forearm crutches & prosthesis modified independent. (Target Date: 03/15/14)   Baseline MET 03/19/14   Status Achieved   PT SHORT TERM GOAL #6   Title descends stairs with 2 rails using hydraulics of prosthesis with cues only. (Target Date: 03/15/14)   Baseline MET 03/19/14   Status Achieved   PT  SHORT TERM GOAL #7   Title ambulates 150' with 1 forearm cructh & prosthesis with minimal gaurd. (Target Date: 03/15/14)   Baseline MET 03/19/14   Status Achieved   PT SHORT TERM GOAL #8   Title picks up object from floor with supervision safely (Target Date: 03/15/14)   Baseline MET 03/19/14   Status Achieved           PT Long Term Goals - 02/26/14 0930    PT LONG TERM GOAL #1   Title Demonstrate and verbalize proper prosthetic care to enable safe utilization of the prosthesis (NEW Target Date:  04/12/14)   Baseline 02/14/2014: Pt independent with cleaning and wear of prosthesis. Still needing cues on sock management and knee use/control.   Time 8   Period Weeks   Status On-going   PT LONG TERM GOAL #2   Title Tolerate wear of prosthesis for >90% of awake hours without change in skin integrity nor undue tenderness.  (Target Date: 02/15/14)   Baseline 02/14/2014: Goal met.   Time 8   Period Weeks   Status Achieved   PT LONG TERM GOAL #3   Title Ambulate 512f with single point cane and prosthesis independently  (NEW Target Date: 04/12/14)   Baseline 02/14/2014: Goal met at RW level when set as LRAD   Time 8   Period Weeks   Status On-going   PT LONG TERM GOAL #4   Title Ambulate 2038fon uneven (grass) surfaces with single point cane and prosthesis independently  (NEW Target Date: 04/12/14)   Baseline Baseline on 12/26/13: Patient recieved his first prosthesis on 12/25/13. Patient is unable to ambulate on uneven surfaces.   Time 8   Period Weeks   Status On-going  icy ground conditions outside.   PT LONG TERM GOAL #5   Title Negotiate ramp, curb, stairs (1 rail) with single point cane and prosthesis independently  (NEW Target Date: 04/12/14)   Baseline 02/14/2014: mostly met at RW level. Still needs cues at time placing him at supervision level with mobility.   Time 8   Period Weeks   Status On-going   PT LONG TERM GOAL #6   Title Perform standing activities for >20 minutes with  prosthesis with no pain or discomfort (Target Date:02/15/14)   Baseline 02/14/2014: met.   Time 8   Period Weeks   Status Achieved   PT LONG TERM GOAL #7   Title Berg Balance test score with prosthesis >/= 45/56  (NEW Target Date: 04/12/14)   Baseline 02/15/2104: Berg score was 38/56.   Time 8   Period Weeks   Status On-going               Plan - 03/26/14 1642    Clinical Impression Statement Patient seems to understand donning new suspension system of velcro strap. patient improved balance activities with repetition.   Pt will benefit from skilled therapeutic intervention in order to improve on the following deficits Abnormal gait;Difficulty walking;Decreased endurance;Decreased activity tolerance;Decreased balance;Decreased mobility;Decreased strength;Decreased knowledge of use of DME;Other (comment)  prosthetic dependency   Rehab Potential Good   PT Frequency 2x / week   PT Duration 8 weeks   PT Treatment/Interventions Therapeutic activities;ADLs/Self Care Home Management;Patient/family education;DME Instruction;Therapeutic exercise;Gait training;Balance training;Manual techniques;Stair training;Neuromuscular re-education;Energy conservation;Functional mobility training;Other (comment)  prosthetic training   PT Next Visit Plan work on job related tasks. forearm crutches outside if weather permits. single point cane on level surfaces   Consulted and Agree with Plan of Care Patient        Problem List Patient Active Problem List   Diagnosis Date Noted  . Acute blood loss anemia 06/25/2013  . Hyperglycemia 06/07/2013  . Arm DVT (deep venous thromboembolism), acute 06/07/2013  . ESRD on dialysis 06/05/2013  . Encephalopathy, toxic 06/03/2013  . Cellulitis and abscess of leg 06/03/2013  . Anemia in chronic kidney disease 06/03/2013  . Arm edema 06/03/2013  . Leg abscess 06/03/2013  . Severe sepsis 06/03/2013  . Septic shock 06/03/2013  . Septic arthritis of ankle or foot,  right 06/03/2013  . Abscess of right lower leg 06/02/2013  .  Wrist lump 07/05/2012  . Swelling of limb-Left index and middle fingers 07/05/2012  . Aftercare following surgery of the circulatory system, [place] 07/05/2012  . Tingling-left wrist and some pain. 07/05/2012  . Pulmonary edema 05/04/2012  . Acute respiratory failure 05/04/2012  . CKD (chronic kidney disease) stage 5, GFR less than 15 ml/min 05/04/2012  . Hyperkalemia 05/04/2012  . Pre-operative cardiovascular examination 03/15/2012  . Iron deficiency anemia 09/22/2010  . HTN (hypertension) 09/22/2010  . Hyperlipidemia 09/22/2010  . Gout 09/22/2010  . CRI (chronic renal insufficiency) 09/22/2010  . Pulmonary embolism, bilateral 09/22/2010    Jamey Reas PT, DPT 03/26/2014, 4:44 PM  [place] [address] [place], [place], [address] Phone: [phone]   Fax:  [phone]

## 2014-03-28 ENCOUNTER — Ambulatory Visit: Payer: BLUE CROSS/BLUE SHIELD | Admitting: Physical Therapy

## 2014-03-28 ENCOUNTER — Encounter: Payer: Self-pay | Admitting: Physical Therapy

## 2014-03-28 DIAGNOSIS — R6889 Other general symptoms and signs: Secondary | ICD-10-CM

## 2014-03-28 DIAGNOSIS — R269 Unspecified abnormalities of gait and mobility: Secondary | ICD-10-CM

## 2014-03-28 DIAGNOSIS — Z89611 Acquired absence of right leg above knee: Secondary | ICD-10-CM | POA: Diagnosis not present

## 2014-03-28 NOTE — Therapy (Signed)
[facility] [address] [place], [place], [address] Phone: [phone]   Fax:  [phone]  Physical Therapy Treatment  Patient Details  Name: Steven Logan MRN: [id number] Date of Birth: 1970-10-19 Referring Provider:  Tivis Ringer, MD  Encounter Date: 03/28/2014      PT End of Session - 03/28/14 0811    Visit Number 23   Number of Visits 30   Date for PT Re-Evaluation 04/12/14   PT Start Time 0803   PT Stop Time 0844   PT Time Calculation (min) 41 min   Equipment Utilized During Treatment Gait belt   Activity Tolerance Patient tolerated treatment well   Behavior During Therapy [facility] for tasks assessed/performed      Past Medical History  Diagnosis Date  . Eczema   . Morbid obesity   . Gout   . HTN (hypertension)   . Dyslipidemia   . Pulmonary embolism     3  in  lungs  at  one time...  . Abscess     great toe  . Pancreatitis   . Arthritis   . Depression   . Diabetes mellitus   . Anemia   . Pneumonia     2-3 years ago  . Shortness of breath     ?? chest  cold he has now.  10/8- no SOB  . Renal hypertension     Hemo started  Sept 2014- MWF  . Chronic kidney disease     esrd    Past Surgical History  Procedure Laterality Date  . None    . Av fistula placement Left 04/07/2012    Procedure: ARTERIOVENOUS (AV) FISTULA CREATION;  Surgeon: Angelia Mould, MD;  Location: [place];  Service: Vascular;  Laterality: Left;  . Ligation of competing branches of arteriovenous fistula Left 09/19/2012    Procedure: LIGATION OF COMPETING BRANCHES OF LEFT ARM ARTERIOVENOUS FISTULA; Vein angioplasty;  Surgeon: Angelia Mould, MD;  Location: [place];  Service: Vascular;  Laterality: Left;  . Insertion of dialysis catheter Right 09/19/2012    Procedure: INSERTION OF DIALYSIS CATHETER;  Surgeon: Angelia Mould, MD;  Location: [place];  Service: Vascular;  Laterality: Right;  . I&d extremity Right  06/02/2013    Procedure: IRRIGATION AND DEBRIDEMENT ARTHROSCOPIC RIGHT ANKLE;  Surgeon: Augustin Schooling, MD;  Location: [place];  Service: Orthopedics;  Laterality: Right;  . I&d extremity Right 06/04/2013    Procedure: IRRIGATION AND DEBRIDEMENT RIGHT FOOT/ANKLE;  Surgeon: Newt Minion, MD;  Location: [place];  Service: Orthopedics;  Laterality: Right;  . I&d extremity Right 06/06/2013    Procedure: IRRIGATION AND DEBRIDEMENT EXTREMITY;  Surgeon: Newt Minion, MD;  Location: [place];  Service: Orthopedics;  Laterality: Right;  . Amputation Right 06/12/2013    Procedure: Transtibial Amputation;  Surgeon: Newt Minion, MD;  Location: [place];  Service: Orthopedics;  Laterality: Right;  . I&d extremity Right 06/24/2013    Procedure: IRRIGATION AND DEBRIDEMENT and Revsion of TRANSTIBIAL AMPUTATION;  Surgeon: Newt Minion, MD;  Location: [place];  Service: Orthopedics;  Laterality: Right;  . Amputation Right 06/27/2013    Procedure: AMPUTATION ABOVE KNEE;  Surgeon: Newt Minion, MD;  Location: [place];  Service: Orthopedics;  Laterality: Right;  Right Above Knee Amputation  . Fistulogram Left 08/07/2012    Procedure: FISTULOGRAM;  Surgeon: Angelia Mould, MD;  Location: [facility] CATH LAB;  Service: Cardiovascular;  Laterality: Left;  arm  There were no vitals taken for this visit.  Visit Diagnosis:  Abnormality of gait  Activity intolerance      Subjective Assessment - 03/28/14 0811    Symptoms No new complaints. No falls or pain.   Currently in Pain? No/denies   Pain Score 0-No pain           [facility] Adult PT Treatment/Exercise - 03/28/14 8588    Transfers   Sit to Stand 5: Supervision;With armrests;From chair/3-in-1;With upper extremity assist   Stand to Sit 5: Supervision;With upper extremity assist;With armrests;To chair/3-in-1   Ambulation/Gait   Ambulation/Gait Yes   Ambulation/Gait Assistance 5: Supervision   Ambulation/Gait Assistance Details cues on posture and prosthetic knee use with  swing phase;                                         Ambulation Distance (Feet) 530 Feet  x1 2 crutches; 115 x2 with single crutch   Assistive device Crutches;Prosthesis   Gait Pattern Step-through pattern;Decreased stride length;Narrow base of support   Ambulation Surface Level;Indoor   High Level Balance   High Level Balance Activities Backward walking;Side stepping   High Level Balance Comments in parallel bars with intermittent UE support on bars and min assist for balance   Prosthetics   Prosthetic Care Comments  Feels the new strap suspension in more comfortable. Did need to cues to tighten strap on arrival today.   Current prosthetic wear tolerance (days/week)  7 days /wk   Current prosthetic wear tolerance (#hours/day)  all awake hours  drying 2 x day   Residual limb condition  intact per patient. heat rash getting better   Donning Prosthesis Modified independent (device/increased time)   Doffing Prosthesis Modified independent (device/increased time)     Lifting with prosthesis - floor to shelf with 5 feet of stepping forward and backwards carrying 5# crate x 5 reps. Cues on positioning of prosthesis with lifting from floor. Cues on posture and body mechanics.  - lateral transfer/lifiting from elevated mat table to shelf x 5 each way with 5# crate. Cues on body mechanics and technique. Decreased stance on prosthesis with gait with this transfer         PT Short Term Goals - 03/19/14 0929    PT SHORT TERM GOAL #1   Title Verbalize prpoper prosthetic care except cues to adjust the number of ply socks (Target Date: 01/17/14)   Baseline MET 01/15/14   Time 4   Period Weeks   Status Achieved   PT SHORT TERM GOAL #2   Title Tolerate wear of prosthesis >10 hours without change in skin integrity nor undue tenderness (Target Date: 01/17/14)   Baseline MET 01/15/14   Time 4   Period Weeks   Status Achieved   PT SHORT TERM GOAL #3   Title Ambulate 349f with rolling walker  and prosthesis modified  (Target Date: 01/17/14)   Baseline MET 03/19/14   Time 4   Period Weeks   Status Achieved   PT SHORT TERM GOAL #4   Title Negotiate ramp, curb, stairs (1 rail) with rolling walker and prosthesis modified indepent (Target Date: 01/17/14)   Baseline MET 03/19/14   Time 4   Period Weeks   Status Achieved   PT SHORT TERM GOAL #5   Title negotiates ramp & curb with forearm crutches & prosthesis modified independent. (Target Date: 03/15/14)  Baseline MET 03/19/14   Status Achieved   PT SHORT TERM GOAL #6   Title descends stairs with 2 rails using hydraulics of prosthesis with cues only. (Target Date: 03/15/14)   Baseline MET 03/19/14   Status Achieved   PT SHORT TERM GOAL #7   Title ambulates 150' with 1 forearm cructh & prosthesis with minimal gaurd. (Target Date: 03/15/14)   Baseline MET 03/19/14   Status Achieved   PT SHORT TERM GOAL #8   Title picks up object from floor with supervision safely (Target Date: 03/15/14)   Baseline MET 03/19/14   Status Achieved           PT Long Term Goals - 02/26/14 0930    PT LONG TERM GOAL #1   Title Demonstrate and verbalize proper prosthetic care to enable safe utilization of the prosthesis (NEW Target Date: 04/12/14)   Baseline 02/14/2014: Pt independent with cleaning and wear of prosthesis. Still needing cues on sock management and knee use/control.   Time 8   Period Weeks   Status On-going   PT LONG TERM GOAL #2   Title Tolerate wear of prosthesis for >90% of awake hours without change in skin integrity nor undue tenderness.  (Target Date: 02/15/14)   Baseline 02/14/2014: Goal met.   Time 8   Period Weeks   Status Achieved   PT LONG TERM GOAL #3   Title Ambulate 586f with single point cane and prosthesis independently  (NEW Target Date: 04/12/14)   Baseline 02/14/2014: Goal met at RW level when set as LRAD   Time 8   Period Weeks   Status On-going   PT LONG TERM GOAL #4   Title Ambulate 2042fon uneven (grass)  surfaces with single point cane and prosthesis independently  (NEW Target Date: 04/12/14)   Baseline Baseline on 12/26/13: Patient recieved his first prosthesis on 12/25/13. Patient is unable to ambulate on uneven surfaces.   Time 8   Period Weeks   Status On-going  icy ground conditions outside.   PT LONG TERM GOAL #5   Title Negotiate ramp, curb, stairs (1 rail) with single point cane and prosthesis independently  (NEW Target Date: 04/12/14)   Baseline 02/14/2014: mostly met at RW level. Still needs cues at time placing him at supervision level with mobility.   Time 8   Period Weeks   Status On-going   PT LONG TERM GOAL #6   Title Perform standing activities for >20 minutes with prosthesis with no pain or discomfort (Target Date:02/15/14)   Baseline 02/14/2014: met.   Time 8   Period Weeks   Status Achieved   PT LONG TERM GOAL #7   Title Berg Balance test score with prosthesis >/= 45/56  (NEW Target Date: 04/12/14)   Baseline 02/15/2104: Berg score was 38/56.   Time 8   Period Weeks   Status On-going            Plan - 03/28/14 [id number]  Clinical Impression Statement Pt making steady progress toward goals. No issues with initation of lifting and job simulation activities today.   Pt will benefit from skilled therapeutic intervention in order to improve on the following deficits Abnormal gait;Difficulty walking;Decreased endurance;Decreased activity tolerance;Decreased balance;Decreased mobility;Decreased strength;Decreased knowledge of use of DME;Other (comment)  prosthetic dependency   Rehab Potential Good   PT Frequency 2x / week   PT Duration 8 weeks   PT Treatment/Interventions Therapeutic activities;ADLs/Self Care Home Management;Patient/family education;DME Instruction;Therapeutic exercise;Gait training;Balance training;Manual techniques;Stair training;Neuromuscular  re-education;Energy conservation;Functional mobility training;Other (comment)  prosthetic training   PT Next Visit  Plan work on job related tasks. forearm crutches outside if weather permits. single point cane on level surfaces   Consulted and Agree with Plan of Care Patient      Problem List Patient Active Problem List   Diagnosis Date Noted  . Acute blood loss anemia 06/25/2013  . Hyperglycemia 06/07/2013  . Arm DVT (deep venous thromboembolism), acute 06/07/2013  . ESRD on dialysis 06/05/2013  . Encephalopathy, toxic 06/03/2013  . Cellulitis and abscess of leg 06/03/2013  . Anemia in chronic kidney disease 06/03/2013  . Arm edema 06/03/2013  . Leg abscess 06/03/2013  . Severe sepsis 06/03/2013  . Septic shock 06/03/2013  . Septic arthritis of ankle or foot, right 06/03/2013  . Abscess of right lower leg 06/02/2013  . Wrist lump 07/05/2012  . Swelling of limb-Left index and middle fingers 07/05/2012  . Aftercare following surgery of the circulatory system, [place] 07/05/2012  . Tingling-left wrist and some pain. 07/05/2012  . Pulmonary edema 05/04/2012  . Acute respiratory failure 05/04/2012  . CKD (chronic kidney disease) stage 5, GFR less than 15 ml/min 05/04/2012  . Hyperkalemia 05/04/2012  . Pre-operative cardiovascular examination 03/15/2012  . Iron deficiency anemia 09/22/2010  . HTN (hypertension) 09/22/2010  . Hyperlipidemia 09/22/2010  . Gout 09/22/2010  . CRI (chronic renal insufficiency) 09/22/2010  . Pulmonary embolism, bilateral 09/22/2010    Willow Ora 03/28/2014, 2:32 PM  Willow Ora, PTA, [place] [address] [place], [place] [address] [phone] 03/28/2014, 2:32 PM

## 2014-04-02 ENCOUNTER — Encounter: Payer: Self-pay | Admitting: Physical Therapy

## 2014-04-02 ENCOUNTER — Ambulatory Visit: Payer: BLUE CROSS/BLUE SHIELD | Admitting: Physical Therapy

## 2014-04-02 DIAGNOSIS — R6889 Other general symptoms and signs: Secondary | ICD-10-CM

## 2014-04-02 DIAGNOSIS — Z89611 Acquired absence of right leg above knee: Secondary | ICD-10-CM | POA: Diagnosis not present

## 2014-04-02 DIAGNOSIS — R269 Unspecified abnormalities of gait and mobility: Secondary | ICD-10-CM

## 2014-04-02 NOTE — Therapy (Signed)
[facility] [address] [place], [place], [address] Phone: [phone]   Fax:  [phone]  Physical Therapy Treatment  Patient Details  Name: Steven Logan MRN: [id number] Date of Birth: 05-Feb-1970 Referring Provider:  Prince Solian, MD  Encounter Date: 04/02/2014      PT End of Session - 04/02/14 0808    Visit Number 24   Number of Visits 30   Date for PT Re-Evaluation 04/12/14   PT Start Time 0801   PT Stop Time 0844   PT Time Calculation (min) 43 min   Equipment Utilized During Treatment Gait belt   Activity Tolerance Patient tolerated treatment well   Behavior During Therapy [facility] for tasks assessed/performed      Past Medical History  Diagnosis Date  . Eczema   . Morbid obesity   . Gout   . HTN (hypertension)   . Dyslipidemia   . Pulmonary embolism     3  in  lungs  at  one time...  . Abscess     great toe  . Pancreatitis   . Arthritis   . Depression   . Diabetes mellitus   . Anemia   . Pneumonia     2-3 years ago  . Shortness of breath     ?? chest  cold he has now.  10/8- no SOB  . Renal hypertension     Hemo started  Sept 2014- MWF  . Chronic kidney disease     esrd    Past Surgical History  Procedure Laterality Date  . None    . Av fistula placement Left 04/07/2012    Procedure: ARTERIOVENOUS (AV) FISTULA CREATION;  Surgeon: Angelia Mould, MD;  Location: [place];  Service: Vascular;  Laterality: Left;  . Ligation of competing branches of arteriovenous fistula Left 09/19/2012    Procedure: LIGATION OF COMPETING BRANCHES OF LEFT ARM ARTERIOVENOUS FISTULA; Vein angioplasty;  Surgeon: Angelia Mould, MD;  Location: [place];  Service: Vascular;  Laterality: Left;  . Insertion of dialysis catheter Right 09/19/2012    Procedure: INSERTION OF DIALYSIS CATHETER;  Surgeon: Angelia Mould, MD;  Location: [place];  Service: Vascular;  Laterality: Right;  . I&d extremity Right  06/02/2013    Procedure: IRRIGATION AND DEBRIDEMENT ARTHROSCOPIC RIGHT ANKLE;  Surgeon: Augustin Schooling, MD;  Location: [place];  Service: Orthopedics;  Laterality: Right;  . I&d extremity Right 06/04/2013    Procedure: IRRIGATION AND DEBRIDEMENT RIGHT FOOT/ANKLE;  Surgeon: Newt Minion, MD;  Location: [place];  Service: Orthopedics;  Laterality: Right;  . I&d extremity Right 06/06/2013    Procedure: IRRIGATION AND DEBRIDEMENT EXTREMITY;  Surgeon: Newt Minion, MD;  Location: [place];  Service: Orthopedics;  Laterality: Right;  . Amputation Right 06/12/2013    Procedure: Transtibial Amputation;  Surgeon: Newt Minion, MD;  Location: [place];  Service: Orthopedics;  Laterality: Right;  . I&d extremity Right 06/24/2013    Procedure: IRRIGATION AND DEBRIDEMENT and Revsion of TRANSTIBIAL AMPUTATION;  Surgeon: Newt Minion, MD;  Location: [place];  Service: Orthopedics;  Laterality: Right;  . Amputation Right 06/27/2013    Procedure: AMPUTATION ABOVE KNEE;  Surgeon: Newt Minion, MD;  Location: [place];  Service: Orthopedics;  Laterality: Right;  Right Above Knee Amputation  . Fistulogram Left 08/07/2012    Procedure: FISTULOGRAM;  Surgeon: Angelia Mould, MD;  Location: [facility] CATH LAB;  Service: Cardiovascular;  Laterality: Left;  arm  There were no vitals taken for this visit.  Visit Diagnosis:  Abnormality of gait  Activity intolerance      Subjective Assessment - 04/02/14 0807    Symptoms No new complaints. No falls or pain.   Currently in Pain? No/denies   Pain Score 0-No pain          [facility] Adult PT Treatment/Exercise - 04/02/14 0808    Transfers   Sit to Stand 5: Supervision;With upper extremity assist;From chair/3-in-1   Stand to Sit 5: Supervision;With upper extremity assist;To chair/3-in-1   Ambulation/Gait   Ambulation/Gait Yes   Ambulation/Gait Assistance 5: Supervision   Ambulation/Gait Assistance Details cues on posture at times. cues on sequence and  foot placement at times  with gait on uneven surfaces.                Ambulation Distance (Feet) 220 Feet  x1, 500 on uneven surfaces   Assistive device Crutches;Prosthesis   Gait Pattern Step-through pattern;Decreased stride length;Narrow base of support   Ambulation Surface Level;Indoor;Outdoor;Paved   Stairs Assistance 4: Min guard;5: Architectural technologist Details (indicate cue type and reason) focues on engaging prosthetic knee with descending stairs.                        Stair Management Technique Two rails;Step to pattern;Forwards   Number of Stairs 4  x 5 reps   Ramp 5: Supervision   Ramp Details (indicate cue type and reason) with prosthesis and bil forearm crutches, cues on posture   Curb 5: Supervision   Curb Details (indicate cue type and reason) with prosthesis and bil forearm crutches   Prosthetics   Current prosthetic wear tolerance (days/week)  7 days /wk   Current prosthetic wear tolerance (#hours/day)  all awake hours  drying 2-3 x day   Residual limb condition  intact per patient. heat rash getting better   Education Provided Proper wear schedule/adjustment;Correct ply sock adjustment;Residual limb care   Person(s) Educated Patient   Education Method Explanation;Verbal cues   Education Method Verbalized understanding   Donning Prosthesis Modified independent (device/increased time)   Doffing Prosthesis Modified independent (device/increased time)      Lifting with prosthesis - floor to shelf with 5 feet of stepping forward and backwards carrying 10# crate x 5 reps. Cues on positioning of prosthesis with lifting from floor. Cues on posture and body mechanics.  - lateral transfer/lifiting from elevated mat table to shelf x 5 each way with 10# crate. Cues on body mechanics (not to twist) and technique for lifting with prosthesis. Decreased stance on prosthesis with backing up and gait between surfaces while carrying crate.        PT Short Term Goals - 03/19/14 0929    PT SHORT  TERM GOAL #1   Title Verbalize prpoper prosthetic care except cues to adjust the number of ply socks (Target Date: 01/17/14)   Baseline MET 01/15/14   Time 4   Period Weeks   Status Achieved   PT SHORT TERM GOAL #2   Title Tolerate wear of prosthesis >10 hours without change in skin integrity nor undue tenderness (Target Date: 01/17/14)   Baseline MET 01/15/14   Time 4   Period Weeks   Status Achieved   PT SHORT TERM GOAL #3   Title Ambulate 383f with rolling walker and prosthesis modified  (Target Date: 01/17/14)   Baseline MET 03/19/14   Time 4   Period Weeks  Status Achieved   PT SHORT TERM GOAL #4   Title Negotiate ramp, curb, stairs (1 rail) with rolling walker and prosthesis modified indepent (Target Date: 01/17/14)   Baseline MET 03/19/14   Time 4   Period Weeks   Status Achieved   PT SHORT TERM GOAL #5   Title negotiates ramp & curb with forearm crutches & prosthesis modified independent. (Target Date: 03/15/14)   Baseline MET 03/19/14   Status Achieved   PT SHORT TERM GOAL #6   Title descends stairs with 2 rails using hydraulics of prosthesis with cues only. (Target Date: 03/15/14)   Baseline MET 03/19/14   Status Achieved   PT SHORT TERM GOAL #7   Title ambulates 150' with 1 forearm cructh & prosthesis with minimal gaurd. (Target Date: 03/15/14)   Baseline MET 03/19/14   Status Achieved   PT SHORT TERM GOAL #8   Title picks up object from floor with supervision safely (Target Date: 03/15/14)   Baseline MET 03/19/14   Status Achieved           PT Long Term Goals - 02/26/14 0930    PT LONG TERM GOAL #1   Title Demonstrate and verbalize proper prosthetic care to enable safe utilization of the prosthesis (NEW Target Date: 04/12/14)   Baseline 02/14/2014: Pt independent with cleaning and wear of prosthesis. Still needing cues on sock management and knee use/control.   Time 8   Period Weeks   Status On-going   PT LONG TERM GOAL #2   Title Tolerate wear of prosthesis  for >90% of awake hours without change in skin integrity nor undue tenderness.  (Target Date: 02/15/14)   Baseline 02/14/2014: Goal met.   Time 8   Period Weeks   Status Achieved   PT LONG TERM GOAL #3   Title Ambulate 540f with single point cane and prosthesis independently  (NEW Target Date: 04/12/14)   Baseline 02/14/2014: Goal met at RW level when set as LRAD   Time 8   Period Weeks   Status On-going   PT LONG TERM GOAL #4   Title Ambulate 2013fon uneven (grass) surfaces with single point cane and prosthesis independently  (NEW Target Date: 04/12/14)   Baseline Baseline on 12/26/13: Patient recieved his first prosthesis on 12/25/13. Patient is unable to ambulate on uneven surfaces.   Time 8   Period Weeks   Status On-going  icy ground conditions outside.   PT LONG TERM GOAL #5   Title Negotiate ramp, curb, stairs (1 rail) with single point cane and prosthesis independently  (NEW Target Date: 04/12/14)   Baseline 02/14/2014: mostly met at RW level. Still needs cues at time placing him at supervision level with mobility.   Time 8   Period Weeks   Status On-going   PT LONG TERM GOAL #6   Title Perform standing activities for >20 minutes with prosthesis with no pain or discomfort (Target Date:02/15/14)   Baseline 02/14/2014: met.   Time 8   Period Weeks   Status Achieved   PT LONG TERM GOAL #7   Title Berg Balance test score with prosthesis >/= 45/56  (NEW Target Date: 04/12/14)   Baseline 02/15/2104: Berg score was 38/56.   Time 8   Period Weeks   Status On-going               Plan - 04/02/14 [id number]  Clinical Impression Statement Pt making steady progress toward goals.    Pt will benefit  from skilled therapeutic intervention in order to improve on the following deficits Abnormal gait;Difficulty walking;Decreased endurance;Decreased activity tolerance;Decreased balance;Decreased mobility;Decreased strength;Decreased knowledge of use of DME;Other (comment)  prosthetic dependency    Rehab Potential Good   PT Frequency 2x / week   PT Duration 8 weeks   PT Treatment/Interventions Therapeutic activities;ADLs/Self Care Home Management;Patient/family education;DME Instruction;Therapeutic exercise;Gait training;Balance training;Manual techniques;Stair training;Neuromuscular re-education;Energy conservation;Functional mobility training;Other (comment)  prosthetic training   PT Next Visit Plan work on job related tasks. forearm crutches outside if weather permits. single point cane on level surfaces   Consulted and Agree with Plan of Care Patient        Problem List Patient Active Problem List   Diagnosis Date Noted  . Acute blood loss anemia 06/25/2013  . Hyperglycemia 06/07/2013  . Arm DVT (deep venous thromboembolism), acute 06/07/2013  . ESRD on dialysis 06/05/2013  . Encephalopathy, toxic 06/03/2013  . Cellulitis and abscess of leg 06/03/2013  . Anemia in chronic kidney disease 06/03/2013  . Arm edema 06/03/2013  . Leg abscess 06/03/2013  . Severe sepsis 06/03/2013  . Septic shock 06/03/2013  . Septic arthritis of ankle or foot, right 06/03/2013  . Abscess of right lower leg 06/02/2013  . Wrist lump 07/05/2012  . Swelling of limb-Left index and middle fingers 07/05/2012  . Aftercare following surgery of the circulatory system, [place] 07/05/2012  . Tingling-left wrist and some pain. 07/05/2012  . Pulmonary edema 05/04/2012  . Acute respiratory failure 05/04/2012  . CKD (chronic kidney disease) stage 5, GFR less than 15 ml/min 05/04/2012  . Hyperkalemia 05/04/2012  . Pre-operative cardiovascular examination 03/15/2012  . Iron deficiency anemia 09/22/2010  . HTN (hypertension) 09/22/2010  . Hyperlipidemia 09/22/2010  . Gout 09/22/2010  . CRI (chronic renal insufficiency) 09/22/2010  . Pulmonary embolism, bilateral 09/22/2010    Willow Ora 04/02/2014, 9:49 AM  Willow Ora, PTA, [facility] [address], [place], [place]  [address] [phone] 04/02/2014, 9:49 AM

## 2014-04-04 ENCOUNTER — Ambulatory Visit: Payer: BLUE CROSS/BLUE SHIELD | Admitting: Physical Therapy

## 2014-04-05 ENCOUNTER — Encounter: Payer: Self-pay | Admitting: Physical Therapy

## 2014-04-05 ENCOUNTER — Ambulatory Visit: Payer: BLUE CROSS/BLUE SHIELD | Admitting: Physical Therapy

## 2014-04-05 DIAGNOSIS — R269 Unspecified abnormalities of gait and mobility: Secondary | ICD-10-CM

## 2014-04-05 DIAGNOSIS — Z89611 Acquired absence of right leg above knee: Secondary | ICD-10-CM | POA: Diagnosis not present

## 2014-04-05 DIAGNOSIS — R6889 Other general symptoms and signs: Secondary | ICD-10-CM

## 2014-04-05 NOTE — Therapy (Signed)
[facility] [address] [place], [place], [address] Phone: [phone]   Fax:  [phone]  Physical Therapy Treatment  Patient Details  Name: Steven Logan MRN: [id number] Date of Birth: 04/24/1970 Referring Provider:  Prince Solian, MD  Encounter Date: 04/05/2014      PT End of Session - 04/05/14 1405    Visit Number 25   Number of Visits 30   Date for PT Re-Evaluation 04/12/14   PT Start Time 0881   PT Stop Time 1443   PT Time Calculation (min) 40 min   Equipment Utilized During Treatment Gait belt   Activity Tolerance Patient tolerated treatment well   Behavior During Therapy [facility] for tasks assessed/performed      Past Medical History  Diagnosis Date  . Eczema   . Morbid obesity   . Gout   . HTN (hypertension)   . Dyslipidemia   . Pulmonary embolism     3  in  lungs  at  one time...  . Abscess     great toe  . Pancreatitis   . Arthritis   . Depression   . Diabetes mellitus   . Anemia   . Pneumonia     2-3 years ago  . Shortness of breath     ?? chest  cold he has now.  10/8- no SOB  . Renal hypertension     Hemo started  Sept 2014- MWF  . Chronic kidney disease     esrd    Past Surgical History  Procedure Laterality Date  . None    . Av fistula placement Left 04/07/2012    Procedure: ARTERIOVENOUS (AV) FISTULA CREATION;  Surgeon: Angelia Mould, MD;  Location: [place];  Service: Vascular;  Laterality: Left;  . Ligation of competing branches of arteriovenous fistula Left 09/19/2012    Procedure: LIGATION OF COMPETING BRANCHES OF LEFT ARM ARTERIOVENOUS FISTULA; Vein angioplasty;  Surgeon: Angelia Mould, MD;  Location: [place];  Service: Vascular;  Laterality: Left;  . Insertion of dialysis catheter Right 09/19/2012    Procedure: INSERTION OF DIALYSIS CATHETER;  Surgeon: Angelia Mould, MD;  Location: [place];  Service: Vascular;  Laterality: Right;  . I&d extremity Right  06/02/2013    Procedure: IRRIGATION AND DEBRIDEMENT ARTHROSCOPIC RIGHT ANKLE;  Surgeon: Augustin Schooling, MD;  Location: [place];  Service: Orthopedics;  Laterality: Right;  . I&d extremity Right 06/04/2013    Procedure: IRRIGATION AND DEBRIDEMENT RIGHT FOOT/ANKLE;  Surgeon: Newt Minion, MD;  Location: [place];  Service: Orthopedics;  Laterality: Right;  . I&d extremity Right 06/06/2013    Procedure: IRRIGATION AND DEBRIDEMENT EXTREMITY;  Surgeon: Newt Minion, MD;  Location: [place];  Service: Orthopedics;  Laterality: Right;  . Amputation Right 06/12/2013    Procedure: Transtibial Amputation;  Surgeon: Newt Minion, MD;  Location: [place];  Service: Orthopedics;  Laterality: Right;  . I&d extremity Right 06/24/2013    Procedure: IRRIGATION AND DEBRIDEMENT and Revsion of TRANSTIBIAL AMPUTATION;  Surgeon: Newt Minion, MD;  Location: [place];  Service: Orthopedics;  Laterality: Right;  . Amputation Right 06/27/2013    Procedure: AMPUTATION ABOVE KNEE;  Surgeon: Newt Minion, MD;  Location: [place];  Service: Orthopedics;  Laterality: Right;  Right Above Knee Amputation  . Fistulogram Left 08/07/2012    Procedure: FISTULOGRAM;  Surgeon: Angelia Mould, MD;  Location: [facility] CATH LAB;  Service: Cardiovascular;  Laterality: Left;  arm  There were no vitals filed for this visit.  Visit Diagnosis:  Abnormality of gait  Activity intolerance      Subjective Assessment - 04/05/14 1404    Symptoms No new complaints. No falls or pain. Just left dialysis and came here. BP dropped, however rebound with saline.    Currently in Pain? No/denies   Pain Score 0-No pain           [facility] Adult PT Treatment/Exercise - 04/05/14 1406    Transfers   Sit to Stand 5: Supervision;With upper extremity assist;From chair/3-in-1   Stand to Sit 5: Supervision;With upper extremity assist;To chair/3-in-1   Ambulation/Gait   Ambulation/Gait Yes   Ambulation/Gait Assistance 5: Supervision;4: Min guard   Ambulation/Gait  Assistance Details cues on posture and times and on sequence with gait on grass with crutches/prosthesis                       Ambulation Distance (Feet) 1000 Feet   Assistive device Crutches;Prosthesis   Gait Pattern Step-through pattern;Decreased stride length;Narrow base of support   Ambulation Surface Level;Unlevel;Indoor;Outdoor;Paved;Grass   Stairs Yes   Stairs Assistance 4: Min guard   Stairs Assistance Details (indicate cue type and reason) focused on hydrualic knee funcition with descending stairs.                Stair Management Technique Two rails;Step to pattern;Forwards   Number of Stairs 4  x 5 res   Prosthetics   Current prosthetic wear tolerance (days/week)  7 days /wk   Current prosthetic wear tolerance (#hours/day)  all awake hours  drying 2 x day   Residual limb condition  intact per patient. heat rash getting better   Education Provided Other (comment)  proper way to tighten strap   Person(s) Educated Patient   Education Method Explanation   Education Method Verbalized understanding   Donning Prosthesis Supervision  cues to tighten straps   Doffing Prosthesis Modified independent (device/increased time)     Lifting with prosthesis on: floor with stand pivot to higher mat table (waist high) - 15# crate x 5 each way with cues on technique and body mechanics with min guard assist.        PT Short Term Goals - 03/19/14 0929    PT SHORT TERM GOAL #1   Title Verbalize prpoper prosthetic care except cues to adjust the number of ply socks (Target Date: 01/17/14)   Baseline MET 01/15/14   Time 4   Period Weeks   Status Achieved   PT SHORT TERM GOAL #2   Title Tolerate wear of prosthesis >10 hours without change in skin integrity nor undue tenderness (Target Date: 01/17/14)   Baseline MET 01/15/14   Time 4   Period Weeks   Status Achieved   PT SHORT TERM GOAL #3   Title Ambulate 341f with rolling walker and prosthesis modified  (Target Date: 01/17/14)    Baseline MET 03/19/14   Time 4   Period Weeks   Status Achieved   PT SHORT TERM GOAL #4   Title Negotiate ramp, curb, stairs (1 rail) with rolling walker and prosthesis modified indepent (Target Date: 01/17/14)   Baseline MET 03/19/14   Time 4   Period Weeks   Status Achieved   PT SHORT TERM GOAL #5   Title negotiates ramp & curb with forearm crutches & prosthesis modified independent. (Target Date: 03/15/14)   Baseline MET 03/19/14   Status Achieved   PT SHORT TERM GOAL #  6   Title descends stairs with 2 rails using hydraulics of prosthesis with cues only. (Target Date: 03/15/14)   Baseline MET 03/19/14   Status Achieved   PT SHORT TERM GOAL #7   Title ambulates 150' with 1 forearm cructh & prosthesis with minimal gaurd. (Target Date: 03/15/14)   Baseline MET 03/19/14   Status Achieved   PT SHORT TERM GOAL #8   Title picks up object from floor with supervision safely (Target Date: 03/15/14)   Baseline MET 03/19/14   Status Achieved           PT Long Term Goals - 02/26/14 0930    PT LONG TERM GOAL #1   Title Demonstrate and verbalize proper prosthetic care to enable safe utilization of the prosthesis (NEW Target Date: 04/12/14)   Baseline 02/14/2014: Pt independent with cleaning and wear of prosthesis. Still needing cues on sock management and knee use/control.   Time 8   Period Weeks   Status On-going   PT LONG TERM GOAL #2   Title Tolerate wear of prosthesis for >90% of awake hours without change in skin integrity nor undue tenderness.  (Target Date: 02/15/14)   Baseline 02/14/2014: Goal met.   Time 8   Period Weeks   Status Achieved   PT LONG TERM GOAL #3   Title Ambulate 584f with single point cane and prosthesis independently  (NEW Target Date: 04/12/14)   Baseline 02/14/2014: Goal met at RW level when set as LRAD   Time 8   Period Weeks   Status On-going   PT LONG TERM GOAL #4   Title Ambulate 2076fon uneven (grass) surfaces with single point cane and prosthesis  independently  (NEW Target Date: 04/12/14)   Baseline Baseline on 12/26/13: Patient recieved his first prosthesis on 12/25/13. Patient is unable to ambulate on uneven surfaces.   Time 8   Period Weeks   Status On-going  icy ground conditions outside.   PT LONG TERM GOAL #5   Title Negotiate ramp, curb, stairs (1 rail) with single point cane and prosthesis independently  (NEW Target Date: 04/12/14)   Baseline 02/14/2014: mostly met at RW level. Still needs cues at time placing him at supervision level with mobility.   Time 8   Period Weeks   Status On-going   PT LONG TERM GOAL #6   Title Perform standing activities for >20 minutes with prosthesis with no pain or discomfort (Target Date:02/15/14)   Baseline 02/14/2014: met.   Time 8   Period Weeks   Status Achieved   PT LONG TERM GOAL #7   Title Berg Balance test score with prosthesis >/= 45/56  (NEW Target Date: 04/12/14)   Baseline 02/15/2104: Berg score was 38/56.   Time 8   Period Weeks   Status On-going           Plan - 04/05/14 1405    Clinical Impression Statement Pt making progress toward goals with increased gait distance today on uneven terrain. No issues reported with work simulation activities, still needs cues on body mechanics and technique.   Pt will benefit from skilled therapeutic intervention in order to improve on the following deficits Abnormal gait;Difficulty walking;Decreased endurance;Decreased activity tolerance;Decreased balance;Decreased mobility;Decreased strength;Decreased knowledge of use of DME;Other (comment)  prosthetic dependency   Rehab Potential Good   PT Frequency 2x / week   PT Duration 8 weeks   PT Treatment/Interventions Therapeutic activities;ADLs/Self Care Home Management;Patient/family education;DME Instruction;Therapeutic exercise;Gait training;Balance training;Manual techniques;Stair training;Neuromuscular re-education;Energy conservation;Functional mobility  training;Other (comment)  prosthetic  training   PT Next Visit Plan Assess LTG's. work on job related tasks. forearm crutches outside if weather permits. single point cane on level surfaces.   Consulted and Agree with Plan of Care Patient      Problem List Patient Active Problem List   Diagnosis Date Noted  . Acute blood loss anemia 06/25/2013  . Hyperglycemia 06/07/2013  . Arm DVT (deep venous thromboembolism), acute 06/07/2013  . ESRD on dialysis 06/05/2013  . Encephalopathy, toxic 06/03/2013  . Cellulitis and abscess of leg 06/03/2013  . Anemia in chronic kidney disease 06/03/2013  . Arm edema 06/03/2013  . Leg abscess 06/03/2013  . Severe sepsis 06/03/2013  . Septic shock 06/03/2013  . Septic arthritis of ankle or foot, right 06/03/2013  . Abscess of right lower leg 06/02/2013  . Wrist lump 07/05/2012  . Swelling of limb-Left index and middle fingers 07/05/2012  . Aftercare following surgery of the circulatory system, [place] 07/05/2012  . Tingling-left wrist and some pain. 07/05/2012  . Pulmonary edema 05/04/2012  . Acute respiratory failure 05/04/2012  . CKD (chronic kidney disease) stage 5, GFR less than 15 ml/min 05/04/2012  . Hyperkalemia 05/04/2012  . Pre-operative cardiovascular examination 03/15/2012  . Iron deficiency anemia 09/22/2010  . HTN (hypertension) 09/22/2010  . Hyperlipidemia 09/22/2010  . Gout 09/22/2010  . CRI (chronic renal insufficiency) 09/22/2010  . Pulmonary embolism, bilateral 09/22/2010    Willow Ora 04/05/2014, 3:55 PM  Willow Ora, PTA, [facility] [address] [place], [place] [address] [phone] 04/05/2014, 3:55 PM

## 2014-04-09 ENCOUNTER — Ambulatory Visit: Payer: BLUE CROSS/BLUE SHIELD | Admitting: Physical Therapy

## 2014-04-11 ENCOUNTER — Ambulatory Visit: Payer: BLUE CROSS/BLUE SHIELD | Admitting: Physical Therapy

## 2014-04-11 ENCOUNTER — Encounter: Payer: Self-pay | Admitting: Physical Therapy

## 2014-04-11 DIAGNOSIS — R269 Unspecified abnormalities of gait and mobility: Secondary | ICD-10-CM

## 2014-04-11 DIAGNOSIS — Z7409 Other reduced mobility: Secondary | ICD-10-CM

## 2014-04-11 DIAGNOSIS — R2689 Other abnormalities of gait and mobility: Secondary | ICD-10-CM

## 2014-04-11 DIAGNOSIS — Z89611 Acquired absence of right leg above knee: Secondary | ICD-10-CM

## 2014-04-11 NOTE — Therapy (Signed)
[facility] [address] [place], [place], [address] Phone: [phone]   Fax:  [phone]  Physical Therapy Treatment  Patient Details  Name: Steven Logan MRN: [id number] Date of Birth: 1970/07/20 Referring Provider:  Prince Solian, MD  Encounter Date: 04/11/2014      PT End of Session - 04/11/14 1602    Visit Number 26   Number of Visits 30   Date for PT Re-Evaluation 04/12/14   PT Start Time 0800   PT Stop Time 0845   PT Time Calculation (min) 45 min   Equipment Utilized During Treatment Gait belt   Activity Tolerance Patient tolerated treatment well   Behavior During Therapy [facility] for tasks assessed/performed      Past Medical History  Diagnosis Date  . Eczema   . Morbid obesity   . Gout   . HTN (hypertension)   . Dyslipidemia   . Pulmonary embolism     3  in  lungs  at  one time...  . Abscess     great toe  . Pancreatitis   . Arthritis   . Depression   . Diabetes mellitus   . Anemia   . Pneumonia     2-3 years ago  . Shortness of breath     ?? chest  cold he has now.  10/8- no SOB  . Renal hypertension     Hemo started  Sept 2014- MWF  . Chronic kidney disease     esrd    Past Surgical History  Procedure Laterality Date  . None    . Av fistula placement Left 04/07/2012    Procedure: ARTERIOVENOUS (AV) FISTULA CREATION;  Surgeon: Angelia Mould, MD;  Location: [place];  Service: Vascular;  Laterality: Left;  . Ligation of competing branches of arteriovenous fistula Left 09/19/2012    Procedure: LIGATION OF COMPETING BRANCHES OF LEFT ARM ARTERIOVENOUS FISTULA; Vein angioplasty;  Surgeon: Angelia Mould, MD;  Location: [place];  Service: Vascular;  Laterality: Left;  . Insertion of dialysis catheter Right 09/19/2012    Procedure: INSERTION OF DIALYSIS CATHETER;  Surgeon: Angelia Mould, MD;  Location: [place];  Service: Vascular;  Laterality: Right;  . I&d extremity Right  06/02/2013    Procedure: IRRIGATION AND DEBRIDEMENT ARTHROSCOPIC RIGHT ANKLE;  Surgeon: Augustin Schooling, MD;  Location: [place];  Service: Orthopedics;  Laterality: Right;  . I&d extremity Right 06/04/2013    Procedure: IRRIGATION AND DEBRIDEMENT RIGHT FOOT/ANKLE;  Surgeon: Newt Minion, MD;  Location: [place];  Service: Orthopedics;  Laterality: Right;  . I&d extremity Right 06/06/2013    Procedure: IRRIGATION AND DEBRIDEMENT EXTREMITY;  Surgeon: Newt Minion, MD;  Location: [place];  Service: Orthopedics;  Laterality: Right;  . Amputation Right 06/12/2013    Procedure: Transtibial Amputation;  Surgeon: Newt Minion, MD;  Location: [place];  Service: Orthopedics;  Laterality: Right;  . I&d extremity Right 06/24/2013    Procedure: IRRIGATION AND DEBRIDEMENT and Revsion of TRANSTIBIAL AMPUTATION;  Surgeon: Newt Minion, MD;  Location: [place];  Service: Orthopedics;  Laterality: Right;  . Amputation Right 06/27/2013    Procedure: AMPUTATION ABOVE KNEE;  Surgeon: Newt Minion, MD;  Location: [place];  Service: Orthopedics;  Laterality: Right;  Right Above Knee Amputation  . Fistulogram Left 08/07/2012    Procedure: FISTULOGRAM;  Surgeon: Angelia Mould, MD;  Location: [facility] CATH LAB;  Service: Cardiovascular;  Laterality: Left;  arm  There were no vitals filed for this visit.  Visit Diagnosis:  Abnormality of gait  Status post above knee amputation of right lower extremity  Impaired functional mobility and activity tolerance  Balance problems      Subjective Assessment - 04/11/14 0813    Symptoms (p) He has returned to driving.   Currently in Pain? (p) No/denies            [facility] PT Assessment - 04/11/14 0800    Berg Balance Test   Sit to Stand Able to stand without using hands and stabilize independently   Standing Unsupported Able to stand safely 2 minutes   Sitting with Back Unsupported but Feet Supported on Floor or Stool Able to sit safely and securely 2 minutes   Stand to Sit Sits  safely with minimal use of hands   Transfers Able to transfer safely, minor use of hands   Standing Unsupported with Eyes Closed Able to stand 10 seconds with supervision   Standing Ubsupported with Feet Together Able to place feet together independently and stand for 1 minute with supervision   From Standing, Reach Forward with Outstretched Arm Can reach forward >12 cm safely (5")   From Standing Position, Pick up Object from Floor Able to pick up shoe, needs supervision   From Standing Position, Turn to Look Behind Over each Shoulder Looks behind one side only/other side shows less weight shift   Turn 360 Degrees Able to turn 360 degrees safely but slowly   Standing Unsupported, Alternately Place Feet on Step/Stool Able to complete >2 steps/needs minimal assist   Standing Unsupported, One Foot in Front Able to plae foot ahead of the other independently and hold 30 seconds   Standing on One Leg Tries to lift leg/unable to hold 3 seconds but remains standing independently   Total Score 42                   [facility] Adult PT Treatment/Exercise - 04/11/14 0800    Transfers   Sit to Stand 5: Supervision;With upper extremity assist;From chair/3-in-1  cues on positioning   Stand to Sit 5: Supervision;With upper extremity assist;To chair/3-in-1  cues on engaging hydraulics   Ambulation/Gait   Ambulation/Gait Yes   Ambulation/Gait Assistance 5: Supervision;4: Min guard   Ambulation/Gait Assistance Details demo / cues on step width and length   Ambulation Distance (Feet) 500 Feet   Assistive device Prosthesis;Straight cane   Gait Pattern Step-through pattern;Decreased stride length;Narrow base of support   Ambulation Surface Level;Indoor   Stairs Yes   Stairs Assistance 5: Supervision   Stairs Assistance Details (indicate cue type and reason) 2 rails step-to using prosthesis hydraulics   Stair Management Technique Two rails;Forwards;Step to pattern;One rail Right;One rail Left;With  cane  2 rails using hydraulics to descend, 1 rail /cane with step-   Number of Stairs 4  x 5 reps changing rail location scenario   Ramp 4: Min assist  cane & prosthesis   Curb 4: Min assist  cane & prosthesis   Prosthetics   Current prosthetic wear tolerance (days/week)  7 days /wk   Current prosthetic wear tolerance (#hours/day)  all awake hours  drying 2 x day   Residual limb condition  --   Education Provided --  proper donning / rotation orientation   Person(s) Educated Patient   Education Method Explanation;Demonstration   Education Method Verbalized understanding;Returned demonstration   Donning Prosthesis Supervision   Doffing Prosthesis Modified independent (device/increased time)  PT Education - 04/11/14 0800    Education provided Yes   Education Details proper donning, driving with right leg amputation including portable left foot accelerator   Person(s) Educated Patient   Methods Explanation;Demonstration   Comprehension Verbalized understanding;Returned demonstration;Need further instruction          PT Short Term Goals - 03/19/14 0929    PT SHORT TERM GOAL #1   Title Verbalize prpoper prosthetic care except cues to adjust the number of ply socks (Target Date: 01/17/14)   Baseline MET 01/15/14   Time 4   Period Weeks   Status Achieved   PT SHORT TERM GOAL #2   Title Tolerate wear of prosthesis >10 hours without change in skin integrity nor undue tenderness (Target Date: 01/17/14)   Baseline MET 01/15/14   Time 4   Period Weeks   Status Achieved   PT SHORT TERM GOAL #3   Title Ambulate 333f with rolling walker and prosthesis modified  (Target Date: 01/17/14)   Baseline MET 03/19/14   Time 4   Period Weeks   Status Achieved   PT SHORT TERM GOAL #4   Title Negotiate ramp, curb, stairs (1 rail) with rolling walker and prosthesis modified indepent (Target Date: 01/17/14)   Baseline MET 03/19/14   Time 4   Period Weeks   Status  Achieved   PT SHORT TERM GOAL #5   Title negotiates ramp & curb with forearm crutches & prosthesis modified independent. (Target Date: 03/15/14)   Baseline MET 03/19/14   Status Achieved   PT SHORT TERM GOAL #6   Title descends stairs with 2 rails using hydraulics of prosthesis with cues only. (Target Date: 03/15/14)   Baseline MET 03/19/14   Status Achieved   PT SHORT TERM GOAL #7   Title ambulates 150' with 1 forearm cructh & prosthesis with minimal gaurd. (Target Date: 03/15/14)   Baseline MET 03/19/14   Status Achieved   PT SHORT TERM GOAL #8   Title picks up object from floor with supervision safely (Target Date: 03/15/14)   Baseline MET 03/19/14   Status Achieved           PT Long Term Goals - 04/11/14 0800    PT LONG TERM GOAL #1   Title Demonstrate and verbalize proper prosthetic care to enable safe utilization of the prosthesis (NEW Target Date: 05/10/14)   Baseline Partially MET 04/11/14 Patient recieved new socket suspension in last 3 weeks and requires cues on proper donning.    Time 4   Period Weeks   Status On-going   PT LONG TERM GOAL #2   Title Tolerate wear of prosthesis for >90% of awake hours without change in skin integrity nor undue tenderness.  (Target Date: 02/15/14)   Baseline 02/14/2014: Goal met.   Time 8   Period Weeks   Status Achieved   PT LONG TERM GOAL #3   Title Ambulate 5084fwith single point cane and prosthesis independently  (NEW Target Date: 05/10/14)   Baseline partially met 04/11/14 Patient requires supervision for safety.   Time 4   Period Weeks   Status On-going   PT LONG TERM GOAL #4   Title Ambulate 20047fn uneven (grass) surfaces with single point cane and prosthesis independently  (NEW Target Date: 05/10/14)   Baseline NOT MET 04/11/14 patient requires minA    Time 4   Period Weeks   Status On-going  icy ground conditions outside.   PT LONG TERM GOAL #5  Title Negotiate ramp, curb, stairs (1 rail) with single point cane and prosthesis  independently  (NEW Target Date: 05/10/14)   Baseline NOT MET 04/11/14 patient requires minA   Time 4   Period Weeks   Status On-going   PT LONG TERM GOAL #6   Title Perform standing activities for >20 minutes with prosthesis with no pain or discomfort (Target Date:02/15/14)   Baseline 02/14/2014: met.   Time 8   Period Weeks   Status Achieved   PT LONG TERM GOAL #7   Title Berg Balance test score with prosthesis >/= 45/56  (NEW Target Date: 05/12/14)   Baseline partially met 04/11/14 Berg balance improved to 42/56 from 02/14/14 score of 38/56   Time 8   Period Weeks   Status On-going               Plan - 04/11/14 0800    Clinical Impression Statement Patient has made progress towards LTGs but not fully met due to recieving new socket with different suspension (needs instruction) and missed some appts. He has potential to fully meet LTGs with 4 more weeks of PT. See recert   Pt will benefit from skilled therapeutic intervention in order to improve on the following deficits Abnormal gait;Difficulty walking;Decreased endurance;Decreased activity tolerance;Decreased balance;Decreased mobility;Decreased strength;Decreased knowledge of use of DME;Other (comment)  prosthetic dependency   Rehab Potential Good   PT Frequency 2x / week   PT Duration 8 weeks   PT Treatment/Interventions Therapeutic activities;ADLs/Self Care Home Management;Patient/family education;DME Instruction;Therapeutic exercise;Gait training;Balance training;Manual techniques;Stair training;Neuromuscular re-education;Energy conservation;Functional mobility training;Other (comment)  prosthetic training   PT Next Visit Plan continue towards LTGs. cane gait inside &outside.    Consulted and Agree with Plan of Care Patient        Problem List Patient Active Problem List   Diagnosis Date Noted  . Acute blood loss anemia 06/25/2013  . Hyperglycemia 06/07/2013  . Arm DVT (deep venous thromboembolism), acute 06/07/2013   . ESRD on dialysis 06/05/2013  . Encephalopathy, toxic 06/03/2013  . Cellulitis and abscess of leg 06/03/2013  . Anemia in chronic kidney disease 06/03/2013  . Arm edema 06/03/2013  . Leg abscess 06/03/2013  . Severe sepsis 06/03/2013  . Septic shock 06/03/2013  . Septic arthritis of ankle or foot, right 06/03/2013  . Abscess of right lower leg 06/02/2013  . Wrist lump 07/05/2012  . Swelling of limb-Left index and middle fingers 07/05/2012  . Aftercare following surgery of the circulatory system, [place] 07/05/2012  . Tingling-left wrist and some pain. 07/05/2012  . Pulmonary edema 05/04/2012  . Acute respiratory failure 05/04/2012  . CKD (chronic kidney disease) stage 5, GFR less than 15 ml/min 05/04/2012  . Hyperkalemia 05/04/2012  . Pre-operative cardiovascular examination 03/15/2012  . Iron deficiency anemia 09/22/2010  . HTN (hypertension) 09/22/2010  . Hyperlipidemia 09/22/2010  . Gout 09/22/2010  . CRI (chronic renal insufficiency) 09/22/2010  . Pulmonary embolism, bilateral 09/22/2010    Jamey Reas PT, DPT 04/11/2014, 4:15 PM  [place] [address] [place], [place], [address] Phone: [phone]   Fax:  [phone]

## 2014-04-16 ENCOUNTER — Ambulatory Visit: Payer: BLUE CROSS/BLUE SHIELD | Admitting: Physical Therapy

## 2014-04-16 ENCOUNTER — Encounter: Payer: Self-pay | Admitting: Physical Therapy

## 2014-04-16 DIAGNOSIS — R2689 Other abnormalities of gait and mobility: Secondary | ICD-10-CM

## 2014-04-16 DIAGNOSIS — Z89611 Acquired absence of right leg above knee: Secondary | ICD-10-CM

## 2014-04-16 DIAGNOSIS — Z7409 Other reduced mobility: Secondary | ICD-10-CM

## 2014-04-16 DIAGNOSIS — R269 Unspecified abnormalities of gait and mobility: Secondary | ICD-10-CM

## 2014-04-16 NOTE — Therapy (Signed)
[facility] [address] [place], [place], [address] Phone: [phone]   Fax:  [phone]  Physical Therapy Treatment  Patient Details  Name: Steven Logan MRN: [id number] Date of Birth: 05-25-1970 Referring Provider:  Prince Solian, MD  Encounter Date: 04/16/2014      PT End of Session - 04/16/14 0845    Visit Number 27   Number of Visits 30   Date for PT Re-Evaluation 04/12/14   PT Start Time 0800   PT Stop Time 0845   PT Time Calculation (min) 45 min   Equipment Utilized During Treatment Gait belt   Activity Tolerance Patient tolerated treatment well   Behavior During Therapy [facility] for tasks assessed/performed      Past Medical History  Diagnosis Date  . Eczema   . Morbid obesity   . Gout   . HTN (hypertension)   . Dyslipidemia   . Pulmonary embolism     3  in  lungs  at  one time...  . Abscess     great toe  . Pancreatitis   . Arthritis   . Depression   . Diabetes mellitus   . Anemia   . Pneumonia     2-3 years ago  . Shortness of breath     ?? chest  cold he has now.  10/8- no SOB  . Renal hypertension     Hemo started  Sept 2014- MWF  . Chronic kidney disease     esrd    Past Surgical History  Procedure Laterality Date  . None    . Av fistula placement Left 04/07/2012    Procedure: ARTERIOVENOUS (AV) FISTULA CREATION;  Surgeon: Angelia Mould, MD;  Location: [place];  Service: Vascular;  Laterality: Left;  . Ligation of competing branches of arteriovenous fistula Left 09/19/2012    Procedure: LIGATION OF COMPETING BRANCHES OF LEFT ARM ARTERIOVENOUS FISTULA; Vein angioplasty;  Surgeon: Angelia Mould, MD;  Location: [place];  Service: Vascular;  Laterality: Left;  . Insertion of dialysis catheter Right 09/19/2012    Procedure: INSERTION OF DIALYSIS CATHETER;  Surgeon: Angelia Mould, MD;  Location: [place];  Service: Vascular;  Laterality: Right;  . I&d extremity Right  06/02/2013    Procedure: IRRIGATION AND DEBRIDEMENT ARTHROSCOPIC RIGHT ANKLE;  Surgeon: Augustin Schooling, MD;  Location: [place];  Service: Orthopedics;  Laterality: Right;  . I&d extremity Right 06/04/2013    Procedure: IRRIGATION AND DEBRIDEMENT RIGHT FOOT/ANKLE;  Surgeon: Newt Minion, MD;  Location: [place];  Service: Orthopedics;  Laterality: Right;  . I&d extremity Right 06/06/2013    Procedure: IRRIGATION AND DEBRIDEMENT EXTREMITY;  Surgeon: Newt Minion, MD;  Location: [place];  Service: Orthopedics;  Laterality: Right;  . Amputation Right 06/12/2013    Procedure: Transtibial Amputation;  Surgeon: Newt Minion, MD;  Location: [place];  Service: Orthopedics;  Laterality: Right;  . I&d extremity Right 06/24/2013    Procedure: IRRIGATION AND DEBRIDEMENT and Revsion of TRANSTIBIAL AMPUTATION;  Surgeon: Newt Minion, MD;  Location: [place];  Service: Orthopedics;  Laterality: Right;  . Amputation Right 06/27/2013    Procedure: AMPUTATION ABOVE KNEE;  Surgeon: Newt Minion, MD;  Location: [place];  Service: Orthopedics;  Laterality: Right;  Right Above Knee Amputation  . Fistulogram Left 08/07/2012    Procedure: FISTULOGRAM;  Surgeon: Angelia Mould, MD;  Location: [facility] CATH LAB;  Service: Cardiovascular;  Laterality: Left;  arm  There were no vitals filed for this visit.  Visit Diagnosis:  Abnormality of gait  Status post above knee amputation of right lower extremity  Impaired functional mobility and activity tolerance  Balance problems      Subjective Assessment - 04/16/14 0844    Symptoms Area near bone at buttocks pinches his loose skin. Driving but removes prosthesis to crossover and difficult to redonne with long pants.   Currently in Pain? No/denies                       [facility] Adult PT Treatment/Exercise - 04/16/14 0800    Transfers   Sit to Stand 5: Supervision;With upper extremity assist;From chair/3-in-1  cues on positioning   Stand to Sit 5: Supervision;With  upper extremity assist;To chair/3-in-1  cues on engaging hydraulics   Ambulation/Gait   Ambulation/Gait Yes   Ambulation/Gait Assistance 4: Min guard;4: Min assist   Ambulation/Gait Assistance Details verbal & visual cues on pelvic & LE rotation orientation, step width, posture   Ambulation Distance (Feet) 500 Feet   Assistive device Prosthesis;Straight cane   Gait Pattern Step-through pattern;Decreased stride length;Narrow base of support   Ambulation Surface Level;Indoor   Stairs Yes   Stairs Assistance 5: Supervision   Stair Management Technique Forwards;Step to pattern;One rail Left;With cane  2 rails using hydraulics to descend, 1 rail /cane with step-   Number of Stairs 4  x 5 reps changing rail location scenario   Ramp 4: Min assist  cane & prosthesis   Ramp Details (indicate cue type and reason) cues on technique   Curb 4: Min assist  cane & prosthesis   Curb Details (indicate cue type and reason) cues on technique   Prosthetics   Prosthetic Care Comments  driving with prosthesis, donning with liner over ischial sheet   Current prosthetic wear tolerance (days/week)  7 days /wk   Current prosthetic wear tolerance (#hours/day)  all awake hours  drying 2 x day   Residual limb condition  intact   Education Provided Correct ply sock adjustment;Other (comment)  donning with liner over ischial seat   Person(s) Educated Patient   Education Method Explanation;Demonstration   Education Method Verbalized understanding;Returned demonstration   Donning Prosthesis Supervision   Doffing Prosthesis Modified independent (device/increased time)                PT Education - 04/16/14 0845    Education provided Yes   Education Details see prosthetic care instructions.   Person(s) Educated Patient   Methods Explanation;Demonstration   Comprehension Verbalized understanding;Returned demonstration;Need further instruction          PT Short Term Goals - 03/19/14 0929    PT  SHORT TERM GOAL #1   Title Verbalize prpoper prosthetic care except cues to adjust the number of ply socks (Target Date: 01/17/14)   Baseline MET 01/15/14   Time 4   Period Weeks   Status Achieved   PT SHORT TERM GOAL #2   Title Tolerate wear of prosthesis >10 hours without change in skin integrity nor undue tenderness (Target Date: 01/17/14)   Baseline MET 01/15/14   Time 4   Period Weeks   Status Achieved   PT SHORT TERM GOAL #3   Title Ambulate 329f with rolling walker and prosthesis modified  (Target Date: 01/17/14)   Baseline MET 03/19/14   Time 4   Period Weeks   Status Achieved   PT SHORT TERM GOAL #4   Title NEconomist  ramp, curb, stairs (1 rail) with rolling walker and prosthesis modified indepent (Target Date: 01/17/14)   Baseline MET 03/19/14   Time 4   Period Weeks   Status Achieved   PT SHORT TERM GOAL #5   Title negotiates ramp & curb with forearm crutches & prosthesis modified independent. (Target Date: 03/15/14)   Baseline MET 03/19/14   Status Achieved   PT SHORT TERM GOAL #6   Title descends stairs with 2 rails using hydraulics of prosthesis with cues only. (Target Date: 03/15/14)   Baseline MET 03/19/14   Status Achieved   PT SHORT TERM GOAL #7   Title ambulates 150' with 1 forearm cructh & prosthesis with minimal gaurd. (Target Date: 03/15/14)   Baseline MET 03/19/14   Status Achieved   PT SHORT TERM GOAL #8   Title picks up object from floor with supervision safely (Target Date: 03/15/14)   Baseline MET 03/19/14   Status Achieved           PT Long Term Goals - 04/11/14 0800    PT LONG TERM GOAL #1   Title Demonstrate and verbalize proper prosthetic care to enable safe utilization of the prosthesis (NEW Target Date: 05/10/14)   Baseline Partially MET 04/11/14 Patient recieved new socket suspension in last 3 weeks and requires cues on proper donning.    Time 4   Period Weeks   Status On-going   PT LONG TERM GOAL #2   Title Tolerate wear of prosthesis for  >90% of awake hours without change in skin integrity nor undue tenderness.  (Target Date: 02/15/14)   Baseline 02/14/2014: Goal met.   Time 8   Period Weeks   Status Achieved   PT LONG TERM GOAL #3   Title Ambulate 559f with single point cane and prosthesis independently  (NEW Target Date: 05/10/14)   Baseline partially met 04/11/14 Patient requires supervision for safety.   Time 4   Period Weeks   Status On-going   PT LONG TERM GOAL #4   Title Ambulate 2060fon uneven (grass) surfaces with single point cane and prosthesis independently  (NEW Target Date: 05/10/14)   Baseline NOT MET 04/11/14 patient requires minA    Time 4   Period Weeks   Status On-going  icy ground conditions outside.   PT LONG TERM GOAL #5   Title Negotiate ramp, curb, stairs (1 rail) with single point cane and prosthesis independently  (NEW Target Date: 05/10/14)   Baseline NOT MET 04/11/14 patient requires minA   Time 4   Period Weeks   Status On-going   PT LONG TERM GOAL #6   Title Perform standing activities for >20 minutes with prosthesis with no pain or discomfort (Target Date:02/15/14)   Baseline 02/14/2014: met.   Time 8   Period Weeks   Status Achieved   PT LONG TERM GOAL #7   Title Berg Balance test score with prosthesis >/= 45/56  (NEW Target Date: 05/12/14)   Baseline partially met 04/11/14 Berg balance improved to 42/56 from 02/14/14 score of 38/56   Time 8   Period Weeks   Status On-going               Plan - 04/16/14 0845    Clinical Impression Statement Rotating the liner appears to have resolved issue with "pinching" his buttocks. Patient needs further instruction to be safe with cane gait.   Pt will benefit from skilled therapeutic intervention in order to improve on the following deficits Abnormal gait;Difficulty  walking;Decreased endurance;Decreased activity tolerance;Decreased balance;Decreased mobility;Decreased strength;Decreased knowledge of use of DME;Other (comment)  prosthetic  dependency   Rehab Potential Good   PT Frequency 2x / week   PT Duration 8 weeks   PT Treatment/Interventions Therapeutic activities;ADLs/Self Care Home Management;Patient/family education;DME Instruction;Therapeutic exercise;Gait training;Balance training;Manual techniques;Stair training;Neuromuscular re-education;Energy conservation;Functional mobility training;Other (comment)  prosthetic training   PT Next Visit Plan continue towards LTGs. cane gait inside &outside.    Consulted and Agree with Plan of Care Patient        Problem List Patient Active Problem List   Diagnosis Date Noted  . Acute blood loss anemia 06/25/2013  . Hyperglycemia 06/07/2013  . Arm DVT (deep venous thromboembolism), acute 06/07/2013  . ESRD on dialysis 06/05/2013  . Encephalopathy, toxic 06/03/2013  . Cellulitis and abscess of leg 06/03/2013  . Anemia in chronic kidney disease 06/03/2013  . Arm edema 06/03/2013  . Leg abscess 06/03/2013  . Severe sepsis 06/03/2013  . Septic shock 06/03/2013  . Septic arthritis of ankle or foot, right 06/03/2013  . Abscess of right lower leg 06/02/2013  . Wrist lump 07/05/2012  . Swelling of limb-Left index and middle fingers 07/05/2012  . Aftercare following surgery of the circulatory system, [place] 07/05/2012  . Tingling-left wrist and some pain. 07/05/2012  . Pulmonary edema 05/04/2012  . Acute respiratory failure 05/04/2012  . CKD (chronic kidney disease) stage 5, GFR less than 15 ml/min 05/04/2012  . Hyperkalemia 05/04/2012  . Pre-operative cardiovascular examination 03/15/2012  . Iron deficiency anemia 09/22/2010  . HTN (hypertension) 09/22/2010  . Hyperlipidemia 09/22/2010  . Gout 09/22/2010  . CRI (chronic renal insufficiency) 09/22/2010  . Pulmonary embolism, bilateral 09/22/2010    Jamey Reas PT, DPT 04/16/2014, 4:54 PM  [facility] [place] [address] [place], [place], [address] Phone:  [phone]   Fax:  [phone]

## 2014-04-18 ENCOUNTER — Encounter: Payer: Self-pay | Admitting: Physical Therapy

## 2014-04-18 ENCOUNTER — Ambulatory Visit: Payer: BLUE CROSS/BLUE SHIELD | Admitting: Physical Therapy

## 2014-04-18 DIAGNOSIS — Z89611 Acquired absence of right leg above knee: Secondary | ICD-10-CM | POA: Diagnosis not present

## 2014-04-18 DIAGNOSIS — R269 Unspecified abnormalities of gait and mobility: Secondary | ICD-10-CM

## 2014-04-18 DIAGNOSIS — Z7409 Other reduced mobility: Secondary | ICD-10-CM

## 2014-04-18 DIAGNOSIS — R2689 Other abnormalities of gait and mobility: Secondary | ICD-10-CM

## 2014-04-18 NOTE — Therapy (Signed)
[facility] [address] [place], [place], [address] Phone: [phone]   Fax:  [phone]  Physical Therapy Treatment  Patient Details  Name: Steven Logan MRN: [id number] Date of Birth: 25-Apr-1970 Referring Provider:  Prince Solian, MD  Encounter Date: 04/18/2014      PT End of Session - 04/18/14 0810    Visit Number 28   Number of Visits 30   Date for PT Re-Evaluation 04/12/14   PT Start Time 0805   PT Stop Time 0844   PT Time Calculation (min) 39 min   Equipment Utilized During Treatment Gait belt   Activity Tolerance Patient tolerated treatment well   Behavior During Therapy [facility] for tasks assessed/performed      Past Medical History  Diagnosis Date  . Eczema   . Morbid obesity   . Gout   . HTN (hypertension)   . Dyslipidemia   . Pulmonary embolism     3  in  lungs  at  one time...  . Abscess     great toe  . Pancreatitis   . Arthritis   . Depression   . Diabetes mellitus   . Anemia   . Pneumonia     2-3 years ago  . Shortness of breath     ?? chest  cold he has now.  10/8- no SOB  . Renal hypertension     Hemo started  Sept 2014- MWF  . Chronic kidney disease     esrd    Past Surgical History  Procedure Laterality Date  . None    . Av fistula placement Left 04/07/2012    Procedure: ARTERIOVENOUS (AV) FISTULA CREATION;  Surgeon: Angelia Mould, MD;  Location: [place];  Service: Vascular;  Laterality: Left;  . Ligation of competing branches of arteriovenous fistula Left 09/19/2012    Procedure: LIGATION OF COMPETING BRANCHES OF LEFT ARM ARTERIOVENOUS FISTULA; Vein angioplasty;  Surgeon: Angelia Mould, MD;  Location: [place];  Service: Vascular;  Laterality: Left;  . Insertion of dialysis catheter Right 09/19/2012    Procedure: INSERTION OF DIALYSIS CATHETER;  Surgeon: Angelia Mould, MD;  Location: [place];  Service: Vascular;  Laterality: Right;  . I&d extremity Right  06/02/2013    Procedure: IRRIGATION AND DEBRIDEMENT ARTHROSCOPIC RIGHT ANKLE;  Surgeon: Augustin Schooling, MD;  Location: [place];  Service: Orthopedics;  Laterality: Right;  . I&d extremity Right 06/04/2013    Procedure: IRRIGATION AND DEBRIDEMENT RIGHT FOOT/ANKLE;  Surgeon: Newt Minion, MD;  Location: [place];  Service: Orthopedics;  Laterality: Right;  . I&d extremity Right 06/06/2013    Procedure: IRRIGATION AND DEBRIDEMENT EXTREMITY;  Surgeon: Newt Minion, MD;  Location: [place];  Service: Orthopedics;  Laterality: Right;  . Amputation Right 06/12/2013    Procedure: Transtibial Amputation;  Surgeon: Newt Minion, MD;  Location: [place];  Service: Orthopedics;  Laterality: Right;  . I&d extremity Right 06/24/2013    Procedure: IRRIGATION AND DEBRIDEMENT and Revsion of TRANSTIBIAL AMPUTATION;  Surgeon: Newt Minion, MD;  Location: [place];  Service: Orthopedics;  Laterality: Right;  . Amputation Right 06/27/2013    Procedure: AMPUTATION ABOVE KNEE;  Surgeon: Newt Minion, MD;  Location: [place];  Service: Orthopedics;  Laterality: Right;  Right Above Knee Amputation  . Fistulogram Left 08/07/2012    Procedure: FISTULOGRAM;  Surgeon: Angelia Mould, MD;  Location: [facility] CATH LAB;  Service: Cardiovascular;  Laterality: Left;  arm  There were no vitals filed for this visit.  Visit Diagnosis:  Abnormality of gait  Impaired functional mobility and activity tolerance  Balance problems      Subjective Assessment - 04/18/14 0809    Symptoms No new complaints. No pain or falls to report. Reports rotating liner is helping with the pinching. Driving without issues other than redonning prosthesis with pants on.   Currently in Pain? No/denies   Pain Score 0-No pain           [facility] Adult PT Treatment/Exercise - 04/18/14 0811    Transfers   Sit to Stand 5: Supervision;With upper extremity assist;From chair/3-in-1   Stand to Sit 5: Supervision;With upper extremity assist;To chair/3-in-1    Ambulation/Gait   Ambulation/Gait Yes   Ambulation/Gait Assistance 4: Min guard;4: Min assist   Ambulation/Gait Assistance Details cues on cane placement, hip use with advancement of  prosthesis,                         Ambulation Distance (Feet) 640 Feet   Assistive device Prosthesis;Straight cane   Gait Pattern Step-through pattern;Decreased stride length;Narrow base of support;Decreased stance time - right   Ambulation Surface Level;Indoor   Stairs Yes   Stairs Assistance 4: Min guard;5: Supervision   Stairs Assistance Details (indicate cue type and reason) step too pattern using hydraulic knee to lower down, cues on techniqu   Stair Management Technique Two rails;Step to pattern;Forwards   Number of Stairs 4  x 5   Prosthetics   Prosthetic Care Comments  cues on correct way to tighten strap on prosthesis.   Current prosthetic wear tolerance (days/week)  7 days /wk   Current prosthetic wear tolerance (#hours/day)  all awake hours  drying 2 x day   Residual limb condition  intact   Education Provided Correct ply sock adjustment;Other (comment)  correct way to tighten prosthesis strap   Person(s) Educated Patient   Education Method Explanation   Education Method Verbalized understanding   Donning Prosthesis Modified independent (device/increased time)   Doffing Prosthesis Modified independent (device/increased time)      Lifting with prosthesis on: From floor with stand pivot to higher mat table (waist high) - 15# crate x 5 each way with cues on technique and body mechanics with min guard assist. Cues to engage hydraulic knee with lifting and lowering crate.  Standing by mat: Sit/stands without UE assist with emphasis on equal leg weight bearing and hydraulic knee use x 10 reps.  Partial squats with cues and emphasis on equal leg weight bearing and hydraulic knee engagement x 10 reps.        PT Short Term Goals - 03/19/14 0929    PT SHORT TERM GOAL #1   Title Verbalize  prpoper prosthetic care except cues to adjust the number of ply socks (Target Date: 01/17/14)   Baseline MET 01/15/14   Time 4   Period Weeks   Status Achieved   PT SHORT TERM GOAL #2   Title Tolerate wear of prosthesis >10 hours without change in skin integrity nor undue tenderness (Target Date: 01/17/14)   Baseline MET 01/15/14   Time 4   Period Weeks   Status Achieved   PT SHORT TERM GOAL #3   Title Ambulate 374f with rolling walker and prosthesis modified  (Target Date: 01/17/14)   Baseline MET 03/19/14   Time 4   Period Weeks   Status Achieved   PT SHORT TERM GOAL #4  Title Negotiate ramp, curb, stairs (1 rail) with rolling walker and prosthesis modified indepent (Target Date: 01/17/14)   Baseline MET 03/19/14   Time 4   Period Weeks   Status Achieved   PT SHORT TERM GOAL #5   Title negotiates ramp & curb with forearm crutches & prosthesis modified independent. (Target Date: 03/15/14)   Baseline MET 03/19/14   Status Achieved   PT SHORT TERM GOAL #6   Title descends stairs with 2 rails using hydraulics of prosthesis with cues only. (Target Date: 03/15/14)   Baseline MET 03/19/14   Status Achieved   PT SHORT TERM GOAL #7   Title ambulates 150' with 1 forearm cructh & prosthesis with minimal gaurd. (Target Date: 03/15/14)   Baseline MET 03/19/14   Status Achieved   PT SHORT TERM GOAL #8   Title picks up object from floor with supervision safely (Target Date: 03/15/14)   Baseline MET 03/19/14   Status Achieved           PT Long Term Goals - 04/11/14 0800    PT LONG TERM GOAL #1   Title Demonstrate and verbalize proper prosthetic care to enable safe utilization of the prosthesis (NEW Target Date: 05/10/14)   Baseline Partially MET 04/11/14 Patient recieved new socket suspension in last 3 weeks and requires cues on proper donning.    Time 4   Period Weeks   Status On-going   PT LONG TERM GOAL #2   Title Tolerate wear of prosthesis for >90% of awake hours without change in  skin integrity nor undue tenderness.  (Target Date: 02/15/14)   Baseline 02/14/2014: Goal met.   Time 8   Period Weeks   Status Achieved   PT LONG TERM GOAL #3   Title Ambulate 589f with single point cane and prosthesis independently  (NEW Target Date: 05/10/14)   Baseline partially met 04/11/14 Patient requires supervision for safety.   Time 4   Period Weeks   Status On-going   PT LONG TERM GOAL #4   Title Ambulate 2039fon uneven (grass) surfaces with single point cane and prosthesis independently  (NEW Target Date: 05/10/14)   Baseline NOT MET 04/11/14 patient requires minA    Time 4   Period Weeks   Status On-going  icy ground conditions outside.   PT LONG TERM GOAL #5   Title Negotiate ramp, curb, stairs (1 rail) with single point cane and prosthesis independently  (NEW Target Date: 05/10/14)   Baseline NOT MET 04/11/14 patient requires minA   Time 4   Period Weeks   Status On-going   PT LONG TERM GOAL #6   Title Perform standing activities for >20 minutes with prosthesis with no pain or discomfort (Target Date:02/15/14)   Baseline 02/14/2014: met.   Time 8   Period Weeks   Status Achieved   PT LONG TERM GOAL #7   Title Berg Balance test score with prosthesis >/= 45/56  (NEW Target Date: 05/12/14)   Baseline partially met 04/11/14 Berg balance improved to 42/56 from 02/14/14 score of 38/56   Time 8   Period Weeks   Status On-going            Plan - 04/18/14 083500  Clinical Impression Statement Pt making steady progres toward goals. Still unsure of using hydraulic knee due to fear of falling. With repitition and cues this improved.   Pt will benefit from skilled therapeutic intervention in order to improve on the following deficits Abnormal gait;Difficulty  walking;Decreased endurance;Decreased activity tolerance;Decreased balance;Decreased mobility;Decreased strength;Decreased knowledge of use of DME;Other (comment)  prosthetic dependency   Rehab Potential Good   PT Frequency  2x / week   PT Duration 8 weeks   PT Treatment/Interventions Therapeutic activities;ADLs/Self Care Home Management;Patient/family education;DME Instruction;Therapeutic exercise;Gait training;Balance training;Manual techniques;Stair training;Neuromuscular re-education;Energy conservation;Functional mobility training;Other (comment)  prosthetic training   PT Next Visit Plan continue towards LTGs. cane gait inside &outside.    Consulted and Agree with Plan of Care Patient        Problem List Patient Active Problem List   Diagnosis Date Noted  . Acute blood loss anemia 06/25/2013  . Hyperglycemia 06/07/2013  . Arm DVT (deep venous thromboembolism), acute 06/07/2013  . ESRD on dialysis 06/05/2013  . Encephalopathy, toxic 06/03/2013  . Cellulitis and abscess of leg 06/03/2013  . Anemia in chronic kidney disease 06/03/2013  . Arm edema 06/03/2013  . Leg abscess 06/03/2013  . Severe sepsis 06/03/2013  . Septic shock 06/03/2013  . Septic arthritis of ankle or foot, right 06/03/2013  . Abscess of right lower leg 06/02/2013  . Wrist lump 07/05/2012  . Swelling of limb-Left index and middle fingers 07/05/2012  . Aftercare following surgery of the circulatory system, [place] 07/05/2012  . Tingling-left wrist and some pain. 07/05/2012  . Pulmonary edema 05/04/2012  . Acute respiratory failure 05/04/2012  . CKD (chronic kidney disease) stage 5, GFR less than 15 ml/min 05/04/2012  . Hyperkalemia 05/04/2012  . Pre-operative cardiovascular examination 03/15/2012  . Iron deficiency anemia 09/22/2010  . HTN (hypertension) 09/22/2010  . Hyperlipidemia 09/22/2010  . Gout 09/22/2010  . CRI (chronic renal insufficiency) 09/22/2010  . Pulmonary embolism, bilateral 09/22/2010    Willow Ora 04/18/2014, 2:32 PM  Willow Ora, PTA, [facility] [address], [place], [place] [address] [phone] 04/18/2014, 2:32 PM

## 2014-04-23 ENCOUNTER — Ambulatory Visit: Payer: BLUE CROSS/BLUE SHIELD | Admitting: Physical Therapy

## 2014-04-25 ENCOUNTER — Ambulatory Visit: Payer: BLUE CROSS/BLUE SHIELD | Admitting: Physical Therapy

## 2014-04-25 ENCOUNTER — Encounter: Payer: Self-pay | Admitting: Physical Therapy

## 2014-04-25 DIAGNOSIS — Z89611 Acquired absence of right leg above knee: Secondary | ICD-10-CM | POA: Diagnosis not present

## 2014-04-25 DIAGNOSIS — R2689 Other abnormalities of gait and mobility: Secondary | ICD-10-CM

## 2014-04-25 DIAGNOSIS — R269 Unspecified abnormalities of gait and mobility: Secondary | ICD-10-CM

## 2014-04-25 DIAGNOSIS — Z7409 Other reduced mobility: Secondary | ICD-10-CM

## 2014-04-25 NOTE — Therapy (Signed)
[facility] [address] [place], [place], [address] Phone: [phone]   Fax:  [phone]  Physical Therapy Treatment  Patient Details  Name: Steven Logan MRN: [id number] Date of Birth: Jan 20, 1971 Referring Provider:  Prince Solian, MD  Encounter Date: 04/25/2014      PT End of Session - 04/25/14 0810    Visit Number 29   Number of Visits 30   Date for PT Re-Evaluation 04/12/14   PT Start Time 0803   PT Stop Time 0849   PT Time Calculation (min) 46 min   Equipment Utilized During Treatment Gait belt   Activity Tolerance Patient tolerated treatment well   Behavior During Therapy [facility] for tasks assessed/performed      Past Medical History  Diagnosis Date  . Eczema   . Morbid obesity   . Gout   . HTN (hypertension)   . Dyslipidemia   . Pulmonary embolism     3  in  lungs  at  one time...  . Abscess     great toe  . Pancreatitis   . Arthritis   . Depression   . Diabetes mellitus   . Anemia   . Pneumonia     2-3 years ago  . Shortness of breath     ?? chest  cold he has now.  10/8- no SOB  . Renal hypertension     Hemo started  Sept 2014- MWF  . Chronic kidney disease     esrd    Past Surgical History  Procedure Laterality Date  . None    . Av fistula placement Left 04/07/2012    Procedure: ARTERIOVENOUS (AV) FISTULA CREATION;  Surgeon: Angelia Mould, MD;  Location: [place];  Service: Vascular;  Laterality: Left;  . Ligation of competing branches of arteriovenous fistula Left 09/19/2012    Procedure: LIGATION OF COMPETING BRANCHES OF LEFT ARM ARTERIOVENOUS FISTULA; Vein angioplasty;  Surgeon: Angelia Mould, MD;  Location: [place];  Service: Vascular;  Laterality: Left;  . Insertion of dialysis catheter Right 09/19/2012    Procedure: INSERTION OF DIALYSIS CATHETER;  Surgeon: Angelia Mould, MD;  Location: [place];  Service: Vascular;  Laterality: Right;  . I&d extremity Right  06/02/2013    Procedure: IRRIGATION AND DEBRIDEMENT ARTHROSCOPIC RIGHT ANKLE;  Surgeon: Augustin Schooling, MD;  Location: [place];  Service: Orthopedics;  Laterality: Right;  . I&d extremity Right 06/04/2013    Procedure: IRRIGATION AND DEBRIDEMENT RIGHT FOOT/ANKLE;  Surgeon: Newt Minion, MD;  Location: [place];  Service: Orthopedics;  Laterality: Right;  . I&d extremity Right 06/06/2013    Procedure: IRRIGATION AND DEBRIDEMENT EXTREMITY;  Surgeon: Newt Minion, MD;  Location: [place];  Service: Orthopedics;  Laterality: Right;  . Amputation Right 06/12/2013    Procedure: Transtibial Amputation;  Surgeon: Newt Minion, MD;  Location: [place];  Service: Orthopedics;  Laterality: Right;  . I&d extremity Right 06/24/2013    Procedure: IRRIGATION AND DEBRIDEMENT and Revsion of TRANSTIBIAL AMPUTATION;  Surgeon: Newt Minion, MD;  Location: [place];  Service: Orthopedics;  Laterality: Right;  . Amputation Right 06/27/2013    Procedure: AMPUTATION ABOVE KNEE;  Surgeon: Newt Minion, MD;  Location: [place];  Service: Orthopedics;  Laterality: Right;  Right Above Knee Amputation  . Fistulogram Left 08/07/2012    Procedure: FISTULOGRAM;  Surgeon: Angelia Mould, MD;  Location: [facility] CATH LAB;  Service: Cardiovascular;  Laterality: Left;  arm  There were no vitals filed for this visit.  Visit Diagnosis:  Abnormality of gait  Impaired functional mobility and activity tolerance  Balance problems      Subjective Assessment - 04/25/14 0809    Symptoms No pain or falls to report. Does complain of not being able to seat himself all the way into the socket this am and that he is getting a rash around where the top of the liner is.   Currently in Pain? No/denies   Pain Score 0-No pain            [facility] Adult PT Treatment/Exercise - 04/25/14 0811    Ambulation/Gait   Ambulation/Gait Yes   Ambulation/Gait Assistance 4: Min guard   Ambulation/Gait Assistance Details cues on prosthetic placement and use of knee  with gait, pt with tendency to abduct prosthetic leg out vs keeping it under him with gait   Ambulation Distance (Feet) 440 Feet  500 ft outside   Assistive device Prosthesis;Straight cane   Gait Pattern Step-through pattern;Decreased stride length;Narrow base of support;Decreased stance time - right   Ambulation Surface Level;Indoor;Unlevel;Outdoor;Paved   Stairs Yes   Stairs Assistance 4: Min guard;5: Supervision   Stairs Assistance Details (indicate cue type and reason) focused on use of hydrualic knee with going down stairs.    Stair Management Technique Two rails;Alternating pattern;Step to pattern;Forwards   Number of Stairs 4  x 4 reps   Prosthetics   Prosthetic Care Comments  adjusted sock ply down by one ply, and pt was able to get all the way down into the prosthesis socket. discussed use of baby oil or solid deodorant to assist with heat rash management.                          Current prosthetic wear tolerance (days/week)  7 days /wk   Current prosthetic wear tolerance (#hours/day)  all awake hours  drying couple times a day   Residual limb condition  intact   Education Provided Correct ply sock adjustment;Residual limb care   Person(s) Educated Patient   Education Method Explanation   Education Method Verbalized understanding   Donning Prosthesis Modified independent (device/increased time)   Doffing Prosthesis Modified independent (device/increased time)      Lifting with prosthesis on: From floor with stand pivot to higher mat table (waist high) - 15# crate x 5 each way with cues on technique and body mechanics with min guard assist. Cues to engage hydraulic knee with lifting and lowering crate.  Standing by mat: Sit/stands without UE assist with emphasis on equal leg weight bearing and hydraulic knee use x 10 reps.  Partial squats with cues and emphasis on equal leg weight bearing and hydraulic knee engagement x 10 reps        PT Short Term Goals - 03/19/14  0929    PT SHORT TERM GOAL #1   Title Verbalize prpoper prosthetic care except cues to adjust the number of ply socks (Target Date: 01/17/14)   Baseline MET 01/15/14   Time 4   Period Weeks   Status Achieved   PT SHORT TERM GOAL #2   Title Tolerate wear of prosthesis >10 hours without change in skin integrity nor undue tenderness (Target Date: 01/17/14)   Baseline MET 01/15/14   Time 4   Period Weeks   Status Achieved   PT SHORT TERM GOAL #3   Title Ambulate 37f with rolling walker and prosthesis modified  (Target Date:  01/17/14)   Baseline MET 03/19/14   Time 4   Period Weeks   Status Achieved   PT SHORT TERM GOAL #4   Title Negotiate ramp, curb, stairs (1 rail) with rolling walker and prosthesis modified indepent (Target Date: 01/17/14)   Baseline MET 03/19/14   Time 4   Period Weeks   Status Achieved   PT SHORT TERM GOAL #5   Title negotiates ramp & curb with forearm crutches & prosthesis modified independent. (Target Date: 03/15/14)   Baseline MET 03/19/14   Status Achieved   PT SHORT TERM GOAL #6   Title descends stairs with 2 rails using hydraulics of prosthesis with cues only. (Target Date: 03/15/14)   Baseline MET 03/19/14   Status Achieved   PT SHORT TERM GOAL #7   Title ambulates 150' with 1 forearm cructh & prosthesis with minimal gaurd. (Target Date: 03/15/14)   Baseline MET 03/19/14   Status Achieved   PT SHORT TERM GOAL #8   Title picks up object from floor with supervision safely (Target Date: 03/15/14)   Baseline MET 03/19/14   Status Achieved           PT Long Term Goals - 04/11/14 0800    PT LONG TERM GOAL #1   Title Demonstrate and verbalize proper prosthetic care to enable safe utilization of the prosthesis (NEW Target Date: 05/10/14)   Baseline Partially MET 04/11/14 Patient recieved new socket suspension in last 3 weeks and requires cues on proper donning.    Time 4   Period Weeks   Status On-going   PT LONG TERM GOAL #2   Title Tolerate wear of  prosthesis for >90% of awake hours without change in skin integrity nor undue tenderness.  (Target Date: 02/15/14)   Baseline 02/14/2014: Goal met.   Time 8   Period Weeks   Status Achieved   PT LONG TERM GOAL #3   Title Ambulate 584f with single point cane and prosthesis independently  (NEW Target Date: 05/10/14)   Baseline partially met 04/11/14 Patient requires supervision for safety.   Time 4   Period Weeks   Status On-going   PT LONG TERM GOAL #4   Title Ambulate 2028fon uneven (grass) surfaces with single point cane and prosthesis independently  (NEW Target Date: 05/10/14)   Baseline NOT MET 04/11/14 patient requires minA    Time 4   Period Weeks   Status On-going  icy ground conditions outside.   PT LONG TERM GOAL #5   Title Negotiate ramp, curb, stairs (1 rail) with single point cane and prosthesis independently  (NEW Target Date: 05/10/14)   Baseline NOT MET 04/11/14 patient requires minA   Time 4   Period Weeks   Status On-going   PT LONG TERM GOAL #6   Title Perform standing activities for >20 minutes with prosthesis with no pain or discomfort (Target Date:02/15/14)   Baseline 02/14/2014: met.   Time 8   Period Weeks   Status Achieved   PT LONG TERM GOAL #7   Title Berg Balance test score with prosthesis >/= 45/56  (NEW Target Date: 05/12/14)   Baseline partially met 04/11/14 Berg balance improved to 42/56 from 02/14/14 score of 38/56   Time 8   Period Weeks   Status On-going           Plan - 04/25/14 086720  Clinical Impression Statement Pt continues to progress well toward goals. Pt to use cane indoors and walker only outdoors  as of today. Still needs cues to fullly engage prosthetic knee with acitivity due to fear of falling.   Pt will benefit from skilled therapeutic intervention in order to improve on the following deficits Abnormal gait;Difficulty walking;Decreased endurance;Decreased activity tolerance;Decreased balance;Decreased mobility;Decreased  strength;Decreased knowledge of use of DME;Other (comment)  prosthetic dependency   Rehab Potential Good   PT Frequency 2x / week   PT Duration 8 weeks   PT Treatment/Interventions Therapeutic activities;ADLs/Self Care Home Management;Patient/family education;DME Instruction;Therapeutic exercise;Gait training;Balance training;Manual techniques;Stair training;Neuromuscular re-education;Energy conservation;Functional mobility training;Other (comment)  prosthetic training   PT Next Visit Plan continue towards LTGs. cane gait inside &outside.    Consulted and Agree with Plan of Care Patient        Problem List Patient Active Problem List   Diagnosis Date Noted  . Acute blood loss anemia 06/25/2013  . Hyperglycemia 06/07/2013  . Arm DVT (deep venous thromboembolism), acute 06/07/2013  . ESRD on dialysis 06/05/2013  . Encephalopathy, toxic 06/03/2013  . Cellulitis and abscess of leg 06/03/2013  . Anemia in chronic kidney disease 06/03/2013  . Arm edema 06/03/2013  . Leg abscess 06/03/2013  . Severe sepsis 06/03/2013  . Septic shock 06/03/2013  . Septic arthritis of ankle or foot, right 06/03/2013  . Abscess of right lower leg 06/02/2013  . Wrist lump 07/05/2012  . Swelling of limb-Left index and middle fingers 07/05/2012  . Aftercare following surgery of the circulatory system, [place] 07/05/2012  . Tingling-left wrist and some pain. 07/05/2012  . Pulmonary edema 05/04/2012  . Acute respiratory failure 05/04/2012  . CKD (chronic kidney disease) stage 5, GFR less than 15 ml/min 05/04/2012  . Hyperkalemia 05/04/2012  . Pre-operative cardiovascular examination 03/15/2012  . Iron deficiency anemia 09/22/2010  . HTN (hypertension) 09/22/2010  . Hyperlipidemia 09/22/2010  . Gout 09/22/2010  . CRI (chronic renal insufficiency) 09/22/2010  . Pulmonary embolism, bilateral 09/22/2010    Willow Ora 04/25/2014, 3:51 PM  Willow Ora, PTA, [facility] [address] [place], [place] [address] [phone] 04/25/2014, 3:51 PM

## 2014-04-30 ENCOUNTER — Ambulatory Visit: Payer: No Typology Code available for payment source | Attending: Orthopedic Surgery | Admitting: Physical Therapy

## 2014-04-30 ENCOUNTER — Encounter: Payer: Self-pay | Admitting: Physical Therapy

## 2014-04-30 DIAGNOSIS — Z89611 Acquired absence of right leg above knee: Secondary | ICD-10-CM | POA: Diagnosis not present

## 2014-04-30 DIAGNOSIS — R5381 Other malaise: Secondary | ICD-10-CM | POA: Insufficient documentation

## 2014-04-30 DIAGNOSIS — R2689 Other abnormalities of gait and mobility: Secondary | ICD-10-CM

## 2014-04-30 DIAGNOSIS — Z7409 Other reduced mobility: Secondary | ICD-10-CM

## 2014-04-30 DIAGNOSIS — R269 Unspecified abnormalities of gait and mobility: Secondary | ICD-10-CM | POA: Insufficient documentation

## 2014-04-30 NOTE — Therapy (Signed)
[facility] [address] [place], [place], [address] Phone: [phone]   Fax:  [phone]  Physical Therapy Treatment  Patient Details  Name: Steven Logan MRN: [id number] Date of Birth: 12-17-1970 Referring Provider:  Prince Solian, MD  Encounter Date: 04/30/2014      PT End of Session - 04/30/14 0800    Visit Number 30   Number of Visits 33   Date for PT Re-Evaluation 05/10/14   PT Start Time 0800   PT Stop Time 0844   PT Time Calculation (min) 44 min   Equipment Utilized During Treatment Gait belt   Activity Tolerance Patient tolerated treatment well   Behavior During Therapy [facility] for tasks assessed/performed      Past Medical History  Diagnosis Date  . Eczema   . Morbid obesity   . Gout   . HTN (hypertension)   . Dyslipidemia   . Pulmonary embolism     3  in  lungs  at  one time...  . Abscess     great toe  . Pancreatitis   . Arthritis   . Depression   . Diabetes mellitus   . Anemia   . Pneumonia     2-3 years ago  . Shortness of breath     ?? chest  cold he has now.  10/8- no SOB  . Renal hypertension     Hemo started  Sept 2014- MWF  . Chronic kidney disease     esrd    Past Surgical History  Procedure Laterality Date  . None    . Av fistula placement Left 04/07/2012    Procedure: ARTERIOVENOUS (AV) FISTULA CREATION;  Surgeon: Angelia Mould, MD;  Location: [place];  Service: Vascular;  Laterality: Left;  . Ligation of competing branches of arteriovenous fistula Left 09/19/2012    Procedure: LIGATION OF COMPETING BRANCHES OF LEFT ARM ARTERIOVENOUS FISTULA; Vein angioplasty;  Surgeon: Angelia Mould, MD;  Location: [place];  Service: Vascular;  Laterality: Left;  . Insertion of dialysis catheter Right 09/19/2012    Procedure: INSERTION OF DIALYSIS CATHETER;  Surgeon: Angelia Mould, MD;  Location: [place];  Service: Vascular;  Laterality: Right;  . I&d extremity Right  06/02/2013    Procedure: IRRIGATION AND DEBRIDEMENT ARTHROSCOPIC RIGHT ANKLE;  Surgeon: Augustin Schooling, MD;  Location: [place];  Service: Orthopedics;  Laterality: Right;  . I&d extremity Right 06/04/2013    Procedure: IRRIGATION AND DEBRIDEMENT RIGHT FOOT/ANKLE;  Surgeon: Newt Minion, MD;  Location: [place];  Service: Orthopedics;  Laterality: Right;  . I&d extremity Right 06/06/2013    Procedure: IRRIGATION AND DEBRIDEMENT EXTREMITY;  Surgeon: Newt Minion, MD;  Location: [place];  Service: Orthopedics;  Laterality: Right;  . Amputation Right 06/12/2013    Procedure: Transtibial Amputation;  Surgeon: Newt Minion, MD;  Location: [place];  Service: Orthopedics;  Laterality: Right;  . I&d extremity Right 06/24/2013    Procedure: IRRIGATION AND DEBRIDEMENT and Revsion of TRANSTIBIAL AMPUTATION;  Surgeon: Newt Minion, MD;  Location: [place];  Service: Orthopedics;  Laterality: Right;  . Amputation Right 06/27/2013    Procedure: AMPUTATION ABOVE KNEE;  Surgeon: Newt Minion, MD;  Location: [place];  Service: Orthopedics;  Laterality: Right;  Right Above Knee Amputation  . Fistulogram Left 08/07/2012    Procedure: FISTULOGRAM;  Surgeon: Angelia Mould, MD;  Location: [facility] CATH LAB;  Service: Cardiovascular;  Laterality: Left;  arm  There were no vitals filed for this visit.  Visit Diagnosis:  Abnormality of gait  Impaired functional mobility and activity tolerance  Balance problems  Status post above knee amputation of right lower extremity                     [facility] Adult PT Treatment/Exercise - 04/30/14 0800    Transfers   Sit to Stand 5: Supervision;With upper extremity assist;From chair/3-in-1   Sit to Stand Details (indicate cue type and reason) cues to engage prosthetic knee   Stand to Sit 5: Supervision;With upper extremity assist;To chair/3-in-1   Stand to Sit Details cues on prosthetic knee   Ambulation/Gait   Ambulation/Gait Yes   Ambulation/Gait Assistance 4: Min  guard;4: Min assist;5: Supervision   Ambulation/Gait Assistance Details cues on pelvic alignment, engaging prosthetic knee, and posture   Ambulation Distance (Feet) 1000 Feet   Assistive device Prosthesis;Straight cane   Gait Pattern Step-through pattern;Decreased stride length;Narrow base of support;Decreased stance time - right   Ambulation Surface Indoor;Level;Outdoor;Unlevel;Paved;Gravel;Grass;Other (comment)  grass slopes   Stairs Yes   Stairs Assistance 4: Min guard;5: Supervision   Stairs Assistance Details (indicate cue type and reason) tactile & verbal cues on knee hydraulics, descend with hydraulics step-to   Stair Management Technique Two rails;Step to pattern;Forwards;One rail Right;With cane  step-to descend with prosthetic hydraulics   Number of Stairs 4  20 reps   Door Management 5: Supervision   Ramp 5: Supervision  cane & prosthesis   Ramp Details (indicate cue type and reason) verbal cues on engaging hydraulics / technique   Curb 5: Supervision  cane & prosthesis   Curb Details (indicate cue type and reason) cues on technique / placement of foot - posture   Gait Comments treadmill downhill gait  1.61mh for 2 minutes downnhill   Prosthetics   Prosthetic Care Comments  cues on correct way to tighten strap on prosthesis.   Current prosthetic wear tolerance (days/week)  7 days /wk   Current prosthetic wear tolerance (#hours/day)  all awake hours  drying 2 x day   Residual limb condition  intact     PT instructed with demo in stepping over obstacles: forward facing with shorter items and side facing with taller or wider items. Patient performed with single point cane with contact assist.           PT Education - 04/30/14 0800    Education provided Yes   Education Details pelvic alignment in donning and gait. engaging prosthetic hydraulics with activities    Person(s) Educated Patient   Methods Explanation;Demonstration   Comprehension Verbalized  understanding;Returned demonstration;Verbal cues required;Tactile cues required;Need further instruction          PT Short Term Goals - 03/19/14 0929    PT SHORT TERM GOAL #1   Title Verbalize prpoper prosthetic care except cues to adjust the number of ply socks (Target Date: 01/17/14)   Baseline MET 01/15/14   Time 4   Period Weeks   Status Achieved   PT SHORT TERM GOAL #2   Title Tolerate wear of prosthesis >10 hours without change in skin integrity nor undue tenderness (Target Date: 01/17/14)   Baseline MET 01/15/14   Time 4   Period Weeks   Status Achieved   PT SHORT TERM GOAL #3   Title Ambulate 3077fwith rolling walker and prosthesis modified  (Target Date: 01/17/14)   Baseline MET 03/19/14   Time 4   Period Weeks  Status Achieved   PT SHORT TERM GOAL #4   Title Negotiate ramp, curb, stairs (1 rail) with rolling walker and prosthesis modified indepent (Target Date: 01/17/14)   Baseline MET 03/19/14   Time 4   Period Weeks   Status Achieved   PT SHORT TERM GOAL #5   Title negotiates ramp & curb with forearm crutches & prosthesis modified independent. (Target Date: 03/15/14)   Baseline MET 03/19/14   Status Achieved   PT SHORT TERM GOAL #6   Title descends stairs with 2 rails using hydraulics of prosthesis with cues only. (Target Date: 03/15/14)   Baseline MET 03/19/14   Status Achieved   PT SHORT TERM GOAL #7   Title ambulates 150' with 1 forearm cructh & prosthesis with minimal gaurd. (Target Date: 03/15/14)   Baseline MET 03/19/14   Status Achieved   PT SHORT TERM GOAL #8   Title picks up object from floor with supervision safely (Target Date: 03/15/14)   Baseline MET 03/19/14   Status Achieved           PT Long Term Goals - 04/11/14 0800    PT LONG TERM GOAL #1   Title Demonstrate and verbalize proper prosthetic care to enable safe utilization of the prosthesis (NEW Target Date: 05/10/14)   Baseline Partially MET 04/11/14 Patient recieved new socket suspension in  last 3 weeks and requires cues on proper donning.    Time 4   Period Weeks   Status On-going   PT LONG TERM GOAL #2   Title Tolerate wear of prosthesis for >90% of awake hours without change in skin integrity nor undue tenderness.  (Target Date: 02/15/14)   Baseline 02/14/2014: Goal met.   Time 8   Period Weeks   Status Achieved   PT LONG TERM GOAL #3   Title Ambulate 532f with single point cane and prosthesis independently  (NEW Target Date: 05/10/14)   Baseline partially met 04/11/14 Patient requires supervision for safety.   Time 4   Period Weeks   Status On-going   PT LONG TERM GOAL #4   Title Ambulate 202fon uneven (grass) surfaces with single point cane and prosthesis independently  (NEW Target Date: 05/10/14)   Baseline NOT MET 04/11/14 patient requires minA    Time 4   Period Weeks   Status On-going  icy ground conditions outside.   PT LONG TERM GOAL #5   Title Negotiate ramp, curb, stairs (1 rail) with single point cane and prosthesis independently  (NEW Target Date: 05/10/14)   Baseline NOT MET 04/11/14 patient requires minA   Time 4   Period Weeks   Status On-going   PT LONG TERM GOAL #6   Title Perform standing activities for >20 minutes with prosthesis with no pain or discomfort (Target Date:02/15/14)   Baseline 02/14/2014: met.   Time 8   Period Weeks   Status Achieved   PT LONG TERM GOAL #7   Title Berg Balance test score with prosthesis >/= 45/56  (NEW Target Date: 05/12/14)   Baseline partially met 04/11/14 Berg balance improved to 42/56 from 02/14/14 score of 38/56   Time 8   Period Weeks   Status On-going               Plan - 04/30/14 0800    Clinical Impression Statement Patient improved gait with instruction on pelvic alignment and repetition with multiple activiteis to engage hydraulics on knee unit.   Pt will benefit from skilled therapeutic intervention  in order to improve on the following deficits Abnormal gait;Difficulty walking;Decreased  endurance;Decreased activity tolerance;Decreased balance;Decreased mobility;Decreased strength;Decreased knowledge of use of DME;Other (comment)  prosthetic dependency   Rehab Potential Good   PT Frequency 2x / week   PT Duration 8 weeks   PT Treatment/Interventions Therapeutic activities;ADLs/Self Care Home Management;Patient/family education;DME Instruction;Therapeutic exercise;Gait training;Balance training;Manual techniques;Stair training;Neuromuscular re-education;Energy conservation;Functional mobility training;Other (comment)  prosthetic training   PT Next Visit Plan continue towards LTGs. cane gait inside &outside.    Consulted and Agree with Plan of Care Patient        Problem List Patient Active Problem List   Diagnosis Date Noted  . Acute blood loss anemia 06/25/2013  . Hyperglycemia 06/07/2013  . Arm DVT (deep venous thromboembolism), acute 06/07/2013  . ESRD on dialysis 06/05/2013  . Encephalopathy, toxic 06/03/2013  . Cellulitis and abscess of leg 06/03/2013  . Anemia in chronic kidney disease 06/03/2013  . Arm edema 06/03/2013  . Leg abscess 06/03/2013  . Severe sepsis 06/03/2013  . Septic shock 06/03/2013  . Septic arthritis of ankle or foot, right 06/03/2013  . Abscess of right lower leg 06/02/2013  . Wrist lump 07/05/2012  . Swelling of limb-Left index and middle fingers 07/05/2012  . Aftercare following surgery of the circulatory system, [place] 07/05/2012  . Tingling-left wrist and some pain. 07/05/2012  . Pulmonary edema 05/04/2012  . Acute respiratory failure 05/04/2012  . CKD (chronic kidney disease) stage 5, GFR less than 15 ml/min 05/04/2012  . Hyperkalemia 05/04/2012  . Pre-operative cardiovascular examination 03/15/2012  . Iron deficiency anemia 09/22/2010  . HTN (hypertension) 09/22/2010  . Hyperlipidemia 09/22/2010  . Gout 09/22/2010  . CRI (chronic renal insufficiency) 09/22/2010  . Pulmonary embolism, bilateral 09/22/2010    Jamey Reas PT,  DPT 04/30/2014, 3:55 PM  [facility] [address] [place], [place], [address] Phone: [phone]   Fax:  [phone]

## 2014-05-02 ENCOUNTER — Encounter: Payer: Self-pay | Admitting: Physical Therapy

## 2014-05-02 ENCOUNTER — Ambulatory Visit: Payer: No Typology Code available for payment source | Admitting: Physical Therapy

## 2014-05-02 DIAGNOSIS — Z89611 Acquired absence of right leg above knee: Secondary | ICD-10-CM | POA: Diagnosis not present

## 2014-05-02 DIAGNOSIS — R269 Unspecified abnormalities of gait and mobility: Secondary | ICD-10-CM

## 2014-05-02 DIAGNOSIS — R2689 Other abnormalities of gait and mobility: Secondary | ICD-10-CM

## 2014-05-02 DIAGNOSIS — Z7409 Other reduced mobility: Secondary | ICD-10-CM

## 2014-05-02 NOTE — Therapy (Signed)
[facility] [address] [place], [place], [address] Phone: [phone]   Fax:  [phone]  Physical Therapy Treatment  Patient Details  Name: Steven Logan MRN: [id number] Date of Birth: 07-28-1970 Referring Provider:  Prince Solian, MD  Encounter Date: 05/02/2014      PT End of Session - 05/02/14 0811    Visit Number 31   Number of Visits 33   Date for PT Re-Evaluation 05/10/14   PT Start Time 0803   PT Stop Time 0845   PT Time Calculation (min) 42 min   Equipment Utilized During Treatment Gait belt   Activity Tolerance Patient tolerated treatment well   Behavior During Therapy [facility] for tasks assessed/performed      Past Medical History  Diagnosis Date  . Eczema   . Morbid obesity   . Gout   . HTN (hypertension)   . Dyslipidemia   . Pulmonary embolism     3  in  lungs  at  one time...  . Abscess     great toe  . Pancreatitis   . Arthritis   . Depression   . Diabetes mellitus   . Anemia   . Pneumonia     2-3 years ago  . Shortness of breath     ?? chest  cold he has now.  10/8- no SOB  . Renal hypertension     Hemo started  Sept 2014- MWF  . Chronic kidney disease     esrd    Past Surgical History  Procedure Laterality Date  . None    . Av fistula placement Left 04/07/2012    Procedure: ARTERIOVENOUS (AV) FISTULA CREATION;  Surgeon: Angelia Mould, MD;  Location: [place];  Service: Vascular;  Laterality: Left;  . Ligation of competing branches of arteriovenous fistula Left 09/19/2012    Procedure: LIGATION OF COMPETING BRANCHES OF LEFT ARM ARTERIOVENOUS FISTULA; Vein angioplasty;  Surgeon: Angelia Mould, MD;  Location: [place];  Service: Vascular;  Laterality: Left;  . Insertion of dialysis catheter Right 09/19/2012    Procedure: INSERTION OF DIALYSIS CATHETER;  Surgeon: Angelia Mould, MD;  Location: [place];  Service: Vascular;  Laterality: Right;  . I&d extremity Right  06/02/2013    Procedure: IRRIGATION AND DEBRIDEMENT ARTHROSCOPIC RIGHT ANKLE;  Surgeon: Augustin Schooling, MD;  Location: [facility];  Service: Orthopedics;  Laterality: Right;  . I&d extremity Right 06/04/2013    Procedure: IRRIGATION AND DEBRIDEMENT RIGHT FOOT/ANKLE;  Surgeon: Newt Minion, MD;  Location: [place];  Service: Orthopedics;  Laterality: Right;  . I&d extremity Right 06/06/2013    Procedure: IRRIGATION AND DEBRIDEMENT EXTREMITY;  Surgeon: Newt Minion, MD;  Location: [place];  Service: Orthopedics;  Laterality: Right;  . Amputation Right 06/12/2013    Procedure: Transtibial Amputation;  Surgeon: Newt Minion, MD;  Location: [facility];  Service: Orthopedics;  Laterality: Right;  . I&d extremity Right 06/24/2013    Procedure: IRRIGATION AND DEBRIDEMENT and Revsion of TRANSTIBIAL AMPUTATION;  Surgeon: Newt Minion, MD;  Location: [place];  Service: Orthopedics;  Laterality: Right;  . Amputation Right 06/27/2013    Procedure: AMPUTATION ABOVE KNEE;  Surgeon: Newt Minion, MD;  Location: [place];  Service: Orthopedics;  Laterality: Right;  Right Above Knee Amputation  . Fistulogram Left 08/07/2012    Procedure: FISTULOGRAM;  Surgeon: Angelia Mould, MD;  Location: [facility] CATH LAB;  Service: Cardiovascular;  Laterality: Left;  arm  There were no vitals filed for this visit.  Visit Diagnosis:  Abnormality of gait  Impaired functional mobility and activity tolerance  Balance problems      Subjective Assessment - 05/02/14 0810    Subjective No pain or falls to report. Using cane at home and no device at times.l    Currently in Pain? No/denies   Pain Score 0-No pain           [facility] Adult PT Treatment/Exercise - 05/02/14 9983    Transfers   Sit to Stand 5: Supervision;With upper extremity assist;From chair/3-in-1   Stand to Sit 5: Supervision;With upper extremity assist;To chair/3-in-1   Ambulation/Gait   Ambulation/Gait Yes   Ambulation/Gait Assistance 4: Min guard   Ambulation/Gait  Assistance Details cues on pelvic alignment, prostheis position under pt and on posture with gait.   Ambulation Distance (Feet) 700 Feet   Assistive device Prosthesis;Straight cane   Gait Pattern Step-through pattern;Decreased stride length;Decreased stance time - right;Wide base of support   Ambulation Surface Level;Indoor   Stairs Yes   Stairs Assistance 4: Min guard   Stairs Assistance Details (indicate cue type and reason) focusing on knee control of prosthesis with descending stairs. cues on foot placement with descending stairs.                                     Stair Management Technique Two rails;Step to pattern;Forwards   Number of Stairs 4   x 5 reps   Ramp 5: Supervision  with cane/prosthesis x 2 reps   Curb 5: Supervision  with cane/prosthesis x 2 reps   Knee/Hip Exercises: Aerobic   Tread Mill level 0.7-0.8 mph x 5 minutes total with emphasis on prothetic knee control and keeping prosthesis under pt/not abducted out to side. min guard assist with pt self setting treadmill on/off/gait speed.               Prosthetics   Current prosthetic wear tolerance (days/week)  7 days /wk   Current prosthetic wear tolerance (#hours/day)  all awake hours  drying 2 x day   Residual limb condition  intact   Education Provided Correct ply sock adjustment;Residual limb care   Person(s) Educated Patient   Education Method Explanation   Education Method Verbalized understanding   Donning Prosthesis Modified independent (device/increased time)   Doffing Prosthesis Modified independent (device/increased time)            PT Short Term Goals - 03/19/14 0929    PT SHORT TERM GOAL #1   Title Verbalize prpoper prosthetic care except cues to adjust the number of ply socks (Target Date: 01/17/14)   Baseline MET 01/15/14   Time 4   Period Weeks   Status Achieved   PT SHORT TERM GOAL #2   Title Tolerate wear of prosthesis >10 hours without change in skin integrity nor undue tenderness  (Target Date: 01/17/14)   Baseline MET 01/15/14   Time 4   Period Weeks   Status Achieved   PT SHORT TERM GOAL #3   Title Ambulate 335f with rolling walker and prosthesis modified  (Target Date: 01/17/14)   Baseline MET 03/19/14   Time 4   Period Weeks   Status Achieved   PT SHORT TERM GOAL #4   Title Negotiate ramp, curb, stairs (1 rail) with rolling walker and prosthesis modified indepent (Target Date: 01/17/14)   Baseline MET 03/19/14  Time 4   Period Weeks   Status Achieved   PT SHORT TERM GOAL #5   Title negotiates ramp & curb with forearm crutches & prosthesis modified independent. (Target Date: 03/15/14)   Baseline MET 03/19/14   Status Achieved   PT SHORT TERM GOAL #6   Title descends stairs with 2 rails using hydraulics of prosthesis with cues only. (Target Date: 03/15/14)   Baseline MET 03/19/14   Status Achieved   PT SHORT TERM GOAL #7   Title ambulates 150' with 1 forearm cructh & prosthesis with minimal gaurd. (Target Date: 03/15/14)   Baseline MET 03/19/14   Status Achieved   PT SHORT TERM GOAL #8   Title picks up object from floor with supervision safely (Target Date: 03/15/14)   Baseline MET 03/19/14   Status Achieved           PT Long Term Goals - 04/11/14 0800    PT LONG TERM GOAL #1   Title Demonstrate and verbalize proper prosthetic care to enable safe utilization of the prosthesis (NEW Target Date: 05/10/14)   Baseline Partially MET 04/11/14 Patient recieved new socket suspension in last 3 weeks and requires cues on proper donning.    Time 4   Period Weeks   Status On-going   PT LONG TERM GOAL #2   Title Tolerate wear of prosthesis for >90% of awake hours without change in skin integrity nor undue tenderness.  (Target Date: 02/15/14)   Baseline 02/14/2014: Goal met.   Time 8   Period Weeks   Status Achieved   PT LONG TERM GOAL #3   Title Ambulate 525f with single point cane and prosthesis independently  (NEW Target Date: 05/10/14)   Baseline partially  met 04/11/14 Patient requires supervision for safety.   Time 4   Period Weeks   Status On-going   PT LONG TERM GOAL #4   Title Ambulate 2069fon uneven (grass) surfaces with single point cane and prosthesis independently  (NEW Target Date: 05/10/14)   Baseline NOT MET 04/11/14 patient requires minA    Time 4   Period Weeks   Status On-going  icy ground conditions outside.   PT LONG TERM GOAL #5   Title Negotiate ramp, curb, stairs (1 rail) with single point cane and prosthesis independently  (NEW Target Date: 05/10/14)   Baseline NOT MET 04/11/14 patient requires minA   Time 4   Period Weeks   Status On-going   PT LONG TERM GOAL #6   Title Perform standing activities for >20 minutes with prosthesis with no pain or discomfort (Target Date:02/15/14)   Baseline 02/14/2014: met.   Time 8   Period Weeks   Status Achieved   PT LONG TERM GOAL #7   Title Berg Balance test score with prosthesis >/= 45/56  (NEW Target Date: 05/12/14)   Baseline partially met 04/11/14 Berg balance improved to 42/56 from 02/14/14 score of 38/56   Time 8   Period Weeks   Status On-going           Plan - 05/02/14 0811    Clinical Impression Statement Pt continues to make great progress toward goals.   Pt will benefit from skilled therapeutic intervention in order to improve on the following deficits Abnormal gait;Difficulty walking;Decreased endurance;Decreased activity tolerance;Decreased balance;Decreased mobility;Decreased strength;Decreased knowledge of use of DME;Other (comment)  prosthetic dependency   Rehab Potential Good   PT Frequency 2x / week   PT Duration 8 weeks   PT Treatment/Interventions Therapeutic activities;ADLs/Self  Care Home Management;Patient/family education;DME Instruction;Therapeutic exercise;Gait training;Balance training;Manual techniques;Stair training;Neuromuscular re-education;Energy conservation;Functional mobility training;Other (comment)  prosthetic training   PT Next Visit Plan  continue towards LTGs. cane gait inside &outside. Assess LTG's next week.   Consulted and Agree with Plan of Care Patient        Problem List Patient Active Problem List   Diagnosis Date Noted  . Acute blood loss anemia 06/25/2013  . Hyperglycemia 06/07/2013  . Arm DVT (deep venous thromboembolism), acute 06/07/2013  . ESRD on dialysis 06/05/2013  . Encephalopathy, toxic 06/03/2013  . Cellulitis and abscess of leg 06/03/2013  . Anemia in chronic kidney disease 06/03/2013  . Arm edema 06/03/2013  . Leg abscess 06/03/2013  . Severe sepsis 06/03/2013  . Septic shock 06/03/2013  . Septic arthritis of ankle or foot, right 06/03/2013  . Abscess of right lower leg 06/02/2013  . Wrist lump 07/05/2012  . Swelling of limb-Left index and middle fingers 07/05/2012  . Aftercare following surgery of the circulatory system, [place] 07/05/2012  . Tingling-left wrist and some pain. 07/05/2012  . Pulmonary edema 05/04/2012  . Acute respiratory failure 05/04/2012  . CKD (chronic kidney disease) stage 5, GFR less than 15 ml/min 05/04/2012  . Hyperkalemia 05/04/2012  . Pre-operative cardiovascular examination 03/15/2012  . Iron deficiency anemia 09/22/2010  . HTN (hypertension) 09/22/2010  . Hyperlipidemia 09/22/2010  . Gout 09/22/2010  . CRI (chronic renal insufficiency) 09/22/2010  . Pulmonary embolism, bilateral 09/22/2010    Willow Ora 05/02/2014, 7:27 PM  Willow Ora, PTA, [facility] [address] [place], [place] [address] [phone] 05/02/2014, 7:27 PM

## 2014-05-07 ENCOUNTER — Ambulatory Visit: Payer: No Typology Code available for payment source | Admitting: Physical Therapy

## 2014-05-07 ENCOUNTER — Encounter: Payer: Self-pay | Admitting: Physical Therapy

## 2014-05-07 DIAGNOSIS — R6889 Other general symptoms and signs: Secondary | ICD-10-CM

## 2014-05-07 DIAGNOSIS — Z89611 Acquired absence of right leg above knee: Secondary | ICD-10-CM | POA: Diagnosis not present

## 2014-05-07 DIAGNOSIS — R2689 Other abnormalities of gait and mobility: Secondary | ICD-10-CM

## 2014-05-07 DIAGNOSIS — Z7409 Other reduced mobility: Secondary | ICD-10-CM

## 2014-05-07 DIAGNOSIS — R269 Unspecified abnormalities of gait and mobility: Secondary | ICD-10-CM

## 2014-05-07 NOTE — Therapy (Signed)
[facility] [address] [place], [place], [address] Phone: [phone]   Fax:  [phone]  Physical Therapy Treatment  Patient Details  Name: Steven Logan MRN: [id number] Date of Birth: 10-Sep-1970 Referring Provider:  Prince Solian, MD  Encounter Date: 05/07/2014      PT End of Session - 05/07/14 0823    Visit Number 32   Number of Visits 33   Date for PT Re-Evaluation 05/10/14   PT Start Time 0802   PT Stop Time 0847   PT Time Calculation (min) 45 min   Equipment Utilized During Treatment Gait belt   Activity Tolerance Patient tolerated treatment well   Behavior During Therapy [facility] for tasks assessed/performed      Past Medical History  Diagnosis Date  . Eczema   . Morbid obesity   . Gout   . HTN (hypertension)   . Dyslipidemia   . Pulmonary embolism     3  in  lungs  at  one time...  . Abscess     great toe  . Pancreatitis   . Arthritis   . Depression   . Diabetes mellitus   . Anemia   . Pneumonia     2-3 years ago  . Shortness of breath     ?? chest  cold he has now.  10/8- no SOB  . Renal hypertension     Hemo started  Sept 2014- MWF  . Chronic kidney disease     esrd    Past Surgical History  Procedure Laterality Date  . None    . Av fistula placement Left 04/07/2012    Procedure: ARTERIOVENOUS (AV) FISTULA CREATION;  Surgeon: Angelia Mould, MD;  Location: [place];  Service: Vascular;  Laterality: Left;  . Ligation of competing branches of arteriovenous fistula Left 09/19/2012    Procedure: LIGATION OF COMPETING BRANCHES OF LEFT ARM ARTERIOVENOUS FISTULA; Vein angioplasty;  Surgeon: Angelia Mould, MD;  Location: [place];  Service: Vascular;  Laterality: Left;  . Insertion of dialysis catheter Right 09/19/2012    Procedure: INSERTION OF DIALYSIS CATHETER;  Surgeon: Angelia Mould, MD;  Location: [place];  Service: Vascular;  Laterality: Right;  . I&d extremity Right  06/02/2013    Procedure: IRRIGATION AND DEBRIDEMENT ARTHROSCOPIC RIGHT ANKLE;  Surgeon: Augustin Schooling, MD;  Location: [place];  Service: Orthopedics;  Laterality: Right;  . I&d extremity Right 06/04/2013    Procedure: IRRIGATION AND DEBRIDEMENT RIGHT FOOT/ANKLE;  Surgeon: Newt Minion, MD;  Location: [place];  Service: Orthopedics;  Laterality: Right;  . I&d extremity Right 06/06/2013    Procedure: IRRIGATION AND DEBRIDEMENT EXTREMITY;  Surgeon: Newt Minion, MD;  Location: [place];  Service: Orthopedics;  Laterality: Right;  . Amputation Right 06/12/2013    Procedure: Transtibial Amputation;  Surgeon: Newt Minion, MD;  Location: [place];  Service: Orthopedics;  Laterality: Right;  . I&d extremity Right 06/24/2013    Procedure: IRRIGATION AND DEBRIDEMENT and Revsion of TRANSTIBIAL AMPUTATION;  Surgeon: Newt Minion, MD;  Location: [place];  Service: Orthopedics;  Laterality: Right;  . Amputation Right 06/27/2013    Procedure: AMPUTATION ABOVE KNEE;  Surgeon: Newt Minion, MD;  Location: [place];  Service: Orthopedics;  Laterality: Right;  Right Above Knee Amputation  . Fistulogram Left 08/07/2012    Procedure: FISTULOGRAM;  Surgeon: Angelia Mould, MD;  Location: [facility] CATH LAB;  Service: Cardiovascular;  Laterality: Left;  arm  There were no vitals filed for this visit.  Visit Diagnosis:  Abnormality of gait  Impaired functional mobility and activity tolerance  Balance problems  Activity intolerance      Subjective Assessment - 05/07/14 0820    Subjective Reports a fall this am on his ramp when leaving to come to therapy. Got himself up. Prosthesis buckled on him. Did not land on prosthesis, turned himself with the fall and landed on his left side. PT checked pt out, no issues noted. Pt reports soreness mostly, no significant pain. Saw Chris yesterday and had adjustments made to prosthesis.                            Currently in Pain? Yes   Pain Score 1    Pain Location Hip   Pain  Orientation Left   Pain Descriptors / Indicators Aching;Sore   Pain Type Acute pain   Pain Onset Today   Pain Frequency Rarely          [facility] Adult PT Treatment/Exercise - 05/07/14 0824    Transfers   Sit to Stand 5: Supervision;With upper extremity assist;From chair/3-in-1   Stand to Sit 5: Supervision;With upper extremity assist;To chair/3-in-1   Ambulation/Gait   Ambulation/Gait Yes   Ambulation/Gait Assistance 4: Min guard   Ambulation/Gait Assistance Details cues to keep prosthesis under hip, not abducted out with gait;cues on use of prosthetic knee with gait as well                        Ambulation Distance (Feet) 440 Feet  x 2. 220 x 2   Assistive device Prosthesis;Straight cane   Gait Pattern Step-through pattern;Decreased stride length;Decreased stance time - right;Wide base of support   Ambulation Surface Level;Indoor   Stairs Yes   Stairs Assistance 4: Min guard;4: Min assist   Stairs Assistance Details (indicate cue type and reason) cues on engaging prosthetic knee with descending stairs and on prosthetic foot placement with descending stairs. Pt with increased difficulty engaging knee today on first 2 reps, improved with continued practice.                   Stair Management Technique Two rails;Step to pattern;Forwards   Number of Stairs 4  x 10 reps   Ramp 4: Min assist;5: Supervision  with cane x 6 reps   Ramp Details (indicate cue type and reason) cues to shorted steps and engage prosthesis   Curb 5: Supervision  with cane x 1 rep   Curb Details (indicate cue type and reason) cues on posture and use of prosthestic knee   Prosthetics   Current prosthetic wear tolerance (days/week)  7 days /wk   Current prosthetic wear tolerance (#hours/day)  all awake hours  drying as needed   Residual limb condition  intact   Education Provided Residual limb care;Proper weight-bearing schedule/adjustment  prosthetic knee engagement   Person(s) Educated Patient   Education  Method Explanation   Education Method Verbalized understanding   Donning Prosthesis Modified independent (device/increased time)   Doffing Prosthesis Modified independent (device/increased time)           PT Short Term Goals - 03/19/14 0929    PT SHORT TERM GOAL #1   Title Verbalize prpoper prosthetic care except cues to adjust the number of ply socks (Target Date: 01/17/14)   Baseline MET 01/15/14   Time 4   Period Weeks  Status Achieved   PT SHORT TERM GOAL #2   Title Tolerate wear of prosthesis >10 hours without change in skin integrity nor undue tenderness (Target Date: 01/17/14)   Baseline MET 01/15/14   Time 4   Period Weeks   Status Achieved   PT SHORT TERM GOAL #3   Title Ambulate 374f with rolling walker and prosthesis modified  (Target Date: 01/17/14)   Baseline MET 03/19/14   Time 4   Period Weeks   Status Achieved   PT SHORT TERM GOAL #4   Title Negotiate ramp, curb, stairs (1 rail) with rolling walker and prosthesis modified indepent (Target Date: 01/17/14)   Baseline MET 03/19/14   Time 4   Period Weeks   Status Achieved   PT SHORT TERM GOAL #5   Title negotiates ramp & curb with forearm crutches & prosthesis modified independent. (Target Date: 03/15/14)   Baseline MET 03/19/14   Status Achieved   PT SHORT TERM GOAL #6   Title descends stairs with 2 rails using hydraulics of prosthesis with cues only. (Target Date: 03/15/14)   Baseline MET 03/19/14   Status Achieved   PT SHORT TERM GOAL #7   Title ambulates 150' with 1 forearm cructh & prosthesis with minimal gaurd. (Target Date: 03/15/14)   Baseline MET 03/19/14   Status Achieved   PT SHORT TERM GOAL #8   Title picks up object from floor with supervision safely (Target Date: 03/15/14)   Baseline MET 03/19/14   Status Achieved           PT Long Term Goals - 05/07/14 1042    PT LONG TERM GOAL #1   Title Demonstrate and verbalize proper prosthetic care to enable safe utilization of the prosthesis (NEW  Target Date: 05/10/14)   Status Achieved   PT LONG TERM GOAL #2   Title Tolerate wear of prosthesis for >90% of awake hours without change in skin integrity nor undue tenderness.  (Target Date: 02/15/14)   Baseline 02/14/2014: Goal met.   Status Achieved   PT LONG TERM GOAL #3   Title Ambulate 5053fwith single point cane and prosthesis independently  (NEW Target Date: 05/10/14)   Status Achieved   PT LONG TERM GOAL #4   Title Ambulate 2006fn uneven (grass) surfaces with single point cane and prosthesis independently  (NEW Target Date: 05/10/14)   Status On-going  icy ground conditions outside.   PT LONG TERM GOAL #5   Title Negotiate ramp, curb, stairs (1 rail) with single point cane and prosthesis independently  (NEW Target Date: 05/10/14)   Status Partially Met   PT LONG TERM GOAL #6   Title Perform standing activities for >20 minutes with prosthesis with no pain or discomfort (Target Date:02/15/14)   Baseline 02/14/2014: met.   Time 8   Period Weeks   Status Achieved   PT LONG TERM GOAL #7   Title Berg Balance test score with prosthesis >/= 45/56  (NEW Target Date: 05/12/14)   Status On-going           Plan - 05/07/14 0823    Clinical Impression Statement Progressing toward goals.   Pt will benefit from skilled therapeutic intervention in order to improve on the following deficits Abnormal gait;Difficulty walking;Decreased endurance;Decreased activity tolerance;Decreased balance;Decreased mobility;Decreased strength;Decreased knowledge of use of DME;Other (comment)  prosthetic dependency   Rehab Potential Good   PT Frequency 2x / week   PT Duration 8 weeks   PT Treatment/Interventions Therapeutic activities;ADLs/Self Care Home  Management;Patient/family education;DME Instruction;Therapeutic exercise;Gait training;Balance training;Manual techniques;Stair training;Neuromuscular re-education;Energy conservation;Functional mobility training;Other (comment)  prosthetic training   PT  Next Visit Plan Assess remaining LTG's next visit   Consulted and Agree with Plan of Care Patient        Problem List Patient Active Problem List   Diagnosis Date Noted  . Acute blood loss anemia 06/25/2013  . Hyperglycemia 06/07/2013  . Arm DVT (deep venous thromboembolism), acute 06/07/2013  . ESRD on dialysis 06/05/2013  . Encephalopathy, toxic 06/03/2013  . Cellulitis and abscess of leg 06/03/2013  . Anemia in chronic kidney disease 06/03/2013  . Arm edema 06/03/2013  . Leg abscess 06/03/2013  . Severe sepsis 06/03/2013  . Septic shock 06/03/2013  . Septic arthritis of ankle or foot, right 06/03/2013  . Abscess of right lower leg 06/02/2013  . Wrist lump 07/05/2012  . Swelling of limb-Left index and middle fingers 07/05/2012  . Aftercare following surgery of the circulatory system, [place] 07/05/2012  . Tingling-left wrist and some pain. 07/05/2012  . Pulmonary edema 05/04/2012  . Acute respiratory failure 05/04/2012  . CKD (chronic kidney disease) stage 5, GFR less than 15 ml/min 05/04/2012  . Hyperkalemia 05/04/2012  . Pre-operative cardiovascular examination 03/15/2012  . Iron deficiency anemia 09/22/2010  . HTN (hypertension) 09/22/2010  . Hyperlipidemia 09/22/2010  . Gout 09/22/2010  . CRI (chronic renal insufficiency) 09/22/2010  . Pulmonary embolism, bilateral 09/22/2010    Willow Ora 05/07/2014, 10:47 AM  Willow Ora, PTA, [facility] Outpatient Neuro [facility] [address] [place], [place] [address] [phone] 05/07/2014, 10:47 AM

## 2014-05-09 ENCOUNTER — Ambulatory Visit: Payer: No Typology Code available for payment source | Admitting: Physical Therapy

## 2014-05-09 DIAGNOSIS — Z89611 Acquired absence of right leg above knee: Secondary | ICD-10-CM | POA: Diagnosis not present

## 2014-05-09 DIAGNOSIS — Z7409 Other reduced mobility: Secondary | ICD-10-CM

## 2014-05-09 DIAGNOSIS — R2689 Other abnormalities of gait and mobility: Secondary | ICD-10-CM

## 2014-05-09 DIAGNOSIS — R269 Unspecified abnormalities of gait and mobility: Secondary | ICD-10-CM

## 2014-05-09 DIAGNOSIS — R6889 Other general symptoms and signs: Secondary | ICD-10-CM

## 2014-05-09 NOTE — Therapy (Signed)
[facility] [address] [place], [place], [address] Phone: [phone]   Fax:  [phone]  Physical Therapy Treatment  Patient Details  Name: Steven Logan MRN: [id number] Date of Birth: 04/19/1970 Referring Provider:  Prince Solian, MD  Encounter Date: 05/09/2014      PT End of Session - 05/09/14 0806    Visit Number 33   Number of Visits 33   Date for PT Re-Evaluation 05/10/14   PT Start Time 0800   PT Stop Time 0845   PT Time Calculation (min) 45 min   Equipment Utilized During Treatment Gait belt   Activity Tolerance Patient tolerated treatment well   Behavior During Therapy [facility] for tasks assessed/performed      Past Medical History  Diagnosis Date  . Eczema   . Morbid obesity   . Gout   . HTN (hypertension)   . Dyslipidemia   . Pulmonary embolism     3  in  lungs  at  one time...  . Abscess     great toe  . Pancreatitis   . Arthritis   . Depression   . Diabetes mellitus   . Anemia   . Pneumonia     2-3 years ago  . Shortness of breath     ?? chest  cold he has now.  10/8- no SOB  . Renal hypertension     Hemo started  Sept 2014- MWF  . Chronic kidney disease     esrd    Past Surgical History  Procedure Laterality Date  . None    . Av fistula placement Left 04/07/2012    Procedure: ARTERIOVENOUS (AV) FISTULA CREATION;  Surgeon: Angelia Mould, MD;  Location: [place];  Service: Vascular;  Laterality: Left;  . Ligation of competing branches of arteriovenous fistula Left 09/19/2012    Procedure: LIGATION OF COMPETING BRANCHES OF LEFT ARM ARTERIOVENOUS FISTULA; Vein angioplasty;  Surgeon: Angelia Mould, MD;  Location: [place];  Service: Vascular;  Laterality: Left;  . Insertion of dialysis catheter Right 09/19/2012    Procedure: INSERTION OF DIALYSIS CATHETER;  Surgeon: Angelia Mould, MD;  Location: [place];  Service: Vascular;  Laterality: Right;  . I&d extremity Right  06/02/2013    Procedure: IRRIGATION AND DEBRIDEMENT ARTHROSCOPIC RIGHT ANKLE;  Surgeon: Augustin Schooling, MD;  Location: [facility];  Service: Orthopedics;  Laterality: Right;  . I&d extremity Right 06/04/2013    Procedure: IRRIGATION AND DEBRIDEMENT RIGHT FOOT/ANKLE;  Surgeon: Newt Minion, MD;  Location: [place];  Service: Orthopedics;  Laterality: Right;  . I&d extremity Right 06/06/2013    Procedure: IRRIGATION AND DEBRIDEMENT EXTREMITY;  Surgeon: Newt Minion, MD;  Location: [place];  Service: Orthopedics;  Laterality: Right;  . Amputation Right 06/12/2013    Procedure: Transtibial Amputation;  Surgeon: Newt Minion, MD;  Location: [place];  Service: Orthopedics;  Laterality: Right;  . I&d extremity Right 06/24/2013    Procedure: IRRIGATION AND DEBRIDEMENT and Revsion of TRANSTIBIAL AMPUTATION;  Surgeon: Newt Minion, MD;  Location: [place];  Service: Orthopedics;  Laterality: Right;  . Amputation Right 06/27/2013    Procedure: AMPUTATION ABOVE KNEE;  Surgeon: Newt Minion, MD;  Location: [place];  Service: Orthopedics;  Laterality: Right;  Right Above Knee Amputation  . Fistulogram Left 08/07/2012    Procedure: FISTULOGRAM;  Surgeon: Angelia Mould, MD;  Location: [facility] CATH LAB;  Service: Cardiovascular;  Laterality: Left;  arm  There were no vitals filed for this visit.  Visit Diagnosis:  Abnormality of gait  Impaired functional mobility and activity tolerance  Balance problems  Activity intolerance      Subjective Assessment - 05/09/14 0805    Subjective No new falls. No pain to report. No new compaints.   Currently in Pain? No/denies   Pain Score 0-No pain           [facility] Adult PT Treatment/Exercise - 05/09/14 0808    Transfers   Sit to Stand 6: Modified independent (Device/Increase time);With upper extremity assist;From chair/3-in-1   Stand to Sit 6: Modified independent (Device/Increase time);To chair/3-in-1;With upper extremity assist   Ambulation/Gait   Ambulation/Gait  Assistance 6: Modified independent (Device/Increase time)   Ambulation/Gait Assistance Details cues to keep prosthesis underneath him, not abducted out   Ambulation Distance (Feet) 350 Feet  x 1;500 ft even/uneven surfaces   Assistive device Prosthesis;Straight cane   Gait Pattern Step-through pattern;Decreased stride length;Decreased stance time - right;Wide base of support   Ambulation Surface Level;Indoor;Unlevel;Outdoor;Paved;Grass   Stairs Yes   Stairs Assistance 5: Supervision;6: Modified independent (Device/Increase time)   Stairs Assistance Details (indicate cue type and reason) mod I with 1 rail/cane; supervision with 2 rails using prosthesis to descend   Stair Management Technique Two rails;One rail Right;With cane;Step to pattern;Forwards   Number of Stairs 4  x 2 reps   Door Management --   Ramp 5: Supervision;6: Modified independent (Device)  with cane/prosthesis x 6 reps   Ramp Details (indicate cue type and reason) progressed to mod I by 3rd rep after cues on sequence and step length   Curb 6: Modified independent (Device/increase time)  with cane/prosthesis   Berg Balance Test   Sit to Stand Able to stand without using hands and stabilize independently   Standing Unsupported Able to stand safely 2 minutes   Sitting with Back Unsupported but Feet Supported on Floor or Stool Able to sit safely and securely 2 minutes   Stand to Sit Sits safely with minimal use of hands   Transfers Able to transfer safely, minor use of hands   Standing Unsupported with Eyes Closed Able to stand 10 seconds safely   Standing Ubsupported with Feet Together Able to place feet together independently and stand 1 minute safely   From Standing, Reach Forward with Outstretched Arm Can reach confidently >25 cm (10")  10 inches   From Standing Position, Pick up Object from Floor Able to pick up shoe safely and easily   From Standing Position, Turn to Look Behind Over each Shoulder Looks behind one side  only/other side shows less weight shift  right > left   Turn 360 Degrees Able to turn 360 degrees safely but slowly  > 4 sec's each way   Standing Unsupported, Alternately Place Feet on Step/Stool Able to complete 4 steps without aid or supervision   Standing Unsupported, One Foot in Front Able to plae foot ahead of the other independently and hold 30 seconds   Standing on One Leg Able to lift leg independently and hold equal to or more than 3 seconds  left stance only   Total Score 48           PT Short Term Goals - 03/19/14 0929    PT SHORT TERM GOAL #1   Title Verbalize prpoper prosthetic care except cues to adjust the number of ply socks (Target Date: 01/17/14)   Baseline MET 01/15/14   Time 4  Period Weeks   Status Achieved   PT SHORT TERM GOAL #2   Title Tolerate wear of prosthesis >10 hours without change in skin integrity nor undue tenderness (Target Date: 01/17/14)   Baseline MET 01/15/14   Time 4   Period Weeks   Status Achieved   PT SHORT TERM GOAL #3   Title Ambulate 312f with rolling walker and prosthesis modified  (Target Date: 01/17/14)   Baseline MET 03/19/14   Time 4   Period Weeks   Status Achieved   PT SHORT TERM GOAL #4   Title Negotiate ramp, curb, stairs (1 rail) with rolling walker and prosthesis modified indepent (Target Date: 01/17/14)   Baseline MET 03/19/14   Time 4   Period Weeks   Status Achieved   PT SHORT TERM GOAL #5   Title negotiates ramp & curb with forearm crutches & prosthesis modified independent. (Target Date: 03/15/14)   Baseline MET 03/19/14   Status Achieved   PT SHORT TERM GOAL #6   Title descends stairs with 2 rails using hydraulics of prosthesis with cues only. (Target Date: 03/15/14)   Baseline MET 03/19/14   Status Achieved   PT SHORT TERM GOAL #7   Title ambulates 150' with 1 forearm cructh & prosthesis with minimal gaurd. (Target Date: 03/15/14)   Baseline MET 03/19/14   Status Achieved   PT SHORT TERM GOAL #8   Title  picks up object from floor with supervision safely (Target Date: 03/15/14)   Baseline MET 03/19/14   Status Achieved           PT Long Term Goals - 05/09/14 0807    PT LONG TERM GOAL #1   Title Demonstrate and verbalize proper prosthetic care to enable safe utilization of the prosthesis (NEW Target Date: 05/10/14)   Status Achieved   PT LONG TERM GOAL #2   Title Tolerate wear of prosthesis for >90% of awake hours without change in skin integrity nor undue tenderness.  (Target Date: 02/15/14)   Baseline 02/14/2014: Goal met.   Status Achieved   PT LONG TERM GOAL #3   Title Ambulate 5065fwith single point cane and prosthesis independently  (NEW Target Date: 05/10/14)   Status Achieved   PT LONG TERM GOAL #4   Title Ambulate 20015fn uneven (grass) surfaces with single point cane and prosthesis independently  (NEW Target Date: 05/10/14)   Status Achieved  icy ground conditions outside.   PT LONG TERM GOAL #5   Title Negotiate ramp, curb, stairs (1 rail) with single point cane and prosthesis independently  (NEW Target Date: 05/10/14)   Status Achieved   PT LONG TERM GOAL #6   Title Perform standing activities for >20 minutes with prosthesis with no pain or discomfort (Target Date:02/15/14)   Baseline 02/14/2014: met.   Time 8   Period Weeks   Status Achieved   PT LONG TERM GOAL #7   Title Berg Balance test score with prosthesis >/= 45/56  (NEW Target Date: 05/12/14)   Baseline 05/09/14- pt scored 48/56 today.   Status Achieved           Plan - 05/09/14 [id number] Clinical Impression Statement Goals met. Pt with improved ramp management at end of session with cues to load and engage prosthetic knee.   Pt will benefit from skilled therapeutic intervention in order to improve on the following deficits Abnormal gait;Difficulty walking;Decreased endurance;Decreased activity tolerance;Decreased balance;Decreased mobility;Decreased strength;Decreased knowledge of use of DME;Other (comment)  prosthetic dependency   Rehab Potential Good   PT Frequency 2x / week   PT Duration 8 weeks   PT Treatment/Interventions Therapeutic activities;ADLs/Self Care Home Management;Patient/family education;DME Instruction;Therapeutic exercise;Gait training;Balance training;Manual techniques;Stair training;Neuromuscular re-education;Energy conservation;Functional mobility training;Other (comment)  prosthetic training   PT Next Visit Plan Discharge per PT plan of care   Consulted and Agree with Plan of Care Patient        Problem List Patient Active Problem List   Diagnosis Date Noted  . Acute blood loss anemia 06/25/2013  . Hyperglycemia 06/07/2013  . Arm DVT (deep venous thromboembolism), acute 06/07/2013  . ESRD on dialysis 06/05/2013  . Encephalopathy, toxic 06/03/2013  . Cellulitis and abscess of leg 06/03/2013  . Anemia in chronic kidney disease 06/03/2013  . Arm edema 06/03/2013  . Leg abscess 06/03/2013  . Severe sepsis 06/03/2013  . Septic shock 06/03/2013  . Septic arthritis of ankle or foot, right 06/03/2013  . Abscess of right lower leg 06/02/2013  . Wrist lump 07/05/2012  . Swelling of limb-Left index and middle fingers 07/05/2012  . Aftercare following surgery of the circulatory system, [place] 07/05/2012  . Tingling-left wrist and some pain. 07/05/2012  . Pulmonary edema 05/04/2012  . Acute respiratory failure 05/04/2012  . CKD (chronic kidney disease) stage 5, GFR less than 15 ml/min 05/04/2012  . Hyperkalemia 05/04/2012  . Pre-operative cardiovascular examination 03/15/2012  . Iron deficiency anemia 09/22/2010  . HTN (hypertension) 09/22/2010  . Hyperlipidemia 09/22/2010  . Gout 09/22/2010  . CRI (chronic renal insufficiency) 09/22/2010  . Pulmonary embolism, bilateral 09/22/2010    Willow Ora 05/09/2014, 9:31 AM  Willow Ora, PTA, [facility] Outpatient Neuro [facility] [address] [place], [place] [address] [phone] 05/09/2014, 9:32 AM

## 2014-05-16 ENCOUNTER — Encounter: Payer: Self-pay | Admitting: Physical Therapy

## 2014-05-16 NOTE — Therapy (Signed)
Steven Logan [address] [place], [place], [address] Phone: [phone]   Fax:  [phone]  Patient Details  Name: Steven Logan MRN: [id number] Date of Birth: 22-May-1970 Referring Provider:  No ref. provider found  Encounter Date: 05/16/2014   PHYSICAL THERAPY DISCHARGE SUMMARY  Visits from Start of Care: 33  Current functional level related to goals / functional outcomes:     PT Long Term Goals - 05/09/14 0807    PT LONG TERM GOAL #1   Title Demonstrate and verbalize proper prosthetic care to enable safe utilization of the prosthesis (NEW Target Date: 05/10/14)   Status Achieved   PT LONG TERM GOAL #2   Title Tolerate wear of prosthesis for >90% of awake hours without change in skin integrity nor undue tenderness.  (Target Date: 02/15/14)   Baseline 02/14/2014: Goal met.   Status Achieved   PT LONG TERM GOAL #3   Title Ambulate 586f with single point cane and prosthesis independently  (NEW Target Date: 05/10/14)   Status Achieved   PT LONG TERM GOAL #4   Title Ambulate 202fon uneven (grass) surfaces with single point cane and prosthesis independently  (NEW Target Date: 05/10/14)   Status Achieved  icy ground conditions outside.   PT LONG TERM GOAL #5   Title Negotiate ramp, curb, stairs (1 rail) with single point cane and prosthesis independently  (NEW Target Date: 05/10/14)   Status Achieved   PT LONG TERM GOAL #6   Title Perform standing activities for >20 minutes with prosthesis with no pain or discomfort (Target Date:02/15/14)   Baseline 02/14/2014: met.   Time 8   Period Weeks   Status Achieved   PT LONG TERM GOAL #7   Title Berg Balance test score with prosthesis >/= 45/56  (NEW Target Date: 05/12/14)   Baseline 05/09/14- pt scored 48/56 today.   Status Achieved     Remaining deficits: Patient is functioning at community level with prosthesis and cane. And he functions at household level with prosthesis  only.   Education / Equipment: Prosthetic care  Plan: Patient agrees to discharge.  Patient goals were met. Patient is being discharged due to meeting the stated rehab goals.  ?????       Nasri Boakye PT, DPT 05/16/2014, 8:03 AM  [place]: [phone] Fax:  [phone]

## 2014-12-10 ENCOUNTER — Ambulatory Visit: Payer: BLUE CROSS/BLUE SHIELD | Admitting: Physical Therapy

## 2014-12-11 ENCOUNTER — Encounter: Payer: BLUE CROSS/BLUE SHIELD | Admitting: Physical Therapy

## 2014-12-24 ENCOUNTER — Ambulatory Visit: Payer: BLUE CROSS/BLUE SHIELD | Admitting: Physical Therapy

## 2014-12-31 ENCOUNTER — Encounter: Payer: BLUE CROSS/BLUE SHIELD | Admitting: Physical Therapy

## 2015-01-02 ENCOUNTER — Ambulatory Visit: Payer: BLUE CROSS/BLUE SHIELD | Attending: Family Medicine | Admitting: Physical Therapy

## 2015-01-02 DIAGNOSIS — R2681 Unsteadiness on feet: Secondary | ICD-10-CM

## 2015-01-02 DIAGNOSIS — Z5189 Encounter for other specified aftercare: Secondary | ICD-10-CM | POA: Insufficient documentation

## 2015-01-02 DIAGNOSIS — Z7409 Other reduced mobility: Secondary | ICD-10-CM

## 2015-01-02 DIAGNOSIS — Z89611 Acquired absence of right leg above knee: Secondary | ICD-10-CM | POA: Diagnosis present

## 2015-01-02 DIAGNOSIS — R269 Unspecified abnormalities of gait and mobility: Secondary | ICD-10-CM

## 2015-01-02 DIAGNOSIS — R2689 Other abnormalities of gait and mobility: Secondary | ICD-10-CM

## 2015-01-02 DIAGNOSIS — R29818 Other symptoms and signs involving the nervous system: Secondary | ICD-10-CM | POA: Diagnosis present

## 2015-01-02 DIAGNOSIS — Z4789 Encounter for other orthopedic aftercare: Secondary | ICD-10-CM

## 2015-01-02 NOTE — Therapy (Signed)
[facility] [address] [place], [place], [address] Phone: [phone]   Fax:  [phone]  Physical Therapy Evaluation  Patient Details  Name: Steven Logan MRN: [id number] Date of Birth: [birth year] Referring Provider: Rachell Cipro, MD  Encounter Date: 01/02/2015      PT End of Session - 01/02/15 0927    Visit Number 1   Number of Visits 9   Date for PT Re-Evaluation 02/27/14   PT Start Time 0848   PT Stop Time 0925   PT Time Calculation (min) 37 min   Activity Tolerance Patient tolerated treatment well   Behavior During Therapy [facility] for tasks assessed/performed      Past Medical History  Diagnosis Date  . Eczema   . Morbid obesity   . Gout   . HTN (hypertension)   . Dyslipidemia   . Pulmonary embolism     3  in  lungs  at  one time...  . Abscess     great toe  . Pancreatitis   . Arthritis   . Depression   . Diabetes mellitus   . Anemia   . Pneumonia     2-3 years ago  . Shortness of breath     ?? chest  cold he has now.  10/8- no SOB  . Renal hypertension     Hemo started  Sept 2014- MWF  . Chronic kidney disease     esrd    Past Surgical History  Procedure Laterality Date  . None    . Av fistula placement Left 04/07/2012    Procedure: ARTERIOVENOUS (AV) FISTULA CREATION;  Surgeon: Angelia Mould, MD;  Location: [facility];  Service: Vascular;  Laterality: Left;  . Ligation of competing branches of arteriovenous fistula Left 09/19/2012    Procedure: LIGATION OF COMPETING BRANCHES OF LEFT ARM ARTERIOVENOUS FISTULA; Vein angioplasty;  Surgeon: Angelia Mould, MD;  Location: [facility];  Service: Vascular;  Laterality: Left;  . Insertion of dialysis catheter Right 09/19/2012    Procedure: INSERTION OF DIALYSIS CATHETER;  Surgeon: Angelia Mould, MD;  Location: [facility];  Service: Vascular;  Laterality: Right;  . I&d extremity Right 06/02/2013    Procedure: IRRIGATION AND DEBRIDEMENT  ARTHROSCOPIC RIGHT ANKLE;  Surgeon: Augustin Schooling, MD;  Location: [facility];  Service: Orthopedics;  Laterality: Right;  . I&d extremity Right 06/04/2013    Procedure: IRRIGATION AND DEBRIDEMENT RIGHT FOOT/ANKLE;  Surgeon: Newt Minion, MD;  Location: [facility];  Service: Orthopedics;  Laterality: Right;  . I&d extremity Right 06/06/2013    Procedure: IRRIGATION AND DEBRIDEMENT EXTREMITY;  Surgeon: Newt Minion, MD;  Location: [facility];  Service: Orthopedics;  Laterality: Right;  . Amputation Right 06/12/2013    Procedure: Transtibial Amputation;  Surgeon: Newt Minion, MD;  Location: [facility];  Service: Orthopedics;  Laterality: Right;  . I&d extremity Right 06/24/2013    Procedure: IRRIGATION AND DEBRIDEMENT and Revsion of TRANSTIBIAL AMPUTATION;  Surgeon: Newt Minion, MD;  Location: [facility];  Service: Orthopedics;  Laterality: Right;  . Amputation Right 06/27/2013    Procedure: AMPUTATION ABOVE KNEE;  Surgeon: Newt Minion, MD;  Location: [facility];  Service: Orthopedics;  Laterality: Right;  Right Above Knee Amputation  . Fistulogram Left 08/07/2012    Procedure: FISTULOGRAM;  Surgeon: Angelia Mould, MD;  Location: [facility] CATH [facility];  Service: Cardiovascular;  Laterality: Left;  arm    There were no vitals filed for this visit.  Visit Diagnosis:  Abnormality of gait  Impaired functional mobility and activity tolerance  Balance problems  Status post above knee amputation of right lower extremity (HCC)  Unsteadiness  Encounter for prosthetic gait training      Subjective Assessment - 01/02/15 0854    Subjective This 44yo male underwent a right Transfemoral Amputation 06/27/2013 due to infection. He recieved first prosthesis 12/24/2013 and underwent PT for training. He recieved a socket revision with a new susupension type of suction ring.  He fell ~12/09/2014 and damaged prosthesis. It would not hold a charge and locked out in C-Leg safety mode. He saw prosthetist 12/23/2014 who sent his prosthetic  knee for repair and placed a loaner on his prosthesis. Prosthetist anticipates getting his C-leg back in next 2 weeks. Patient reports increased instability over last few months with greater fear of falling so was referred back to PT for evaluation.    Patient Stated Goals To be more active and improve gait & balance to reduce falls   Currently in Pain? No/denies            [facility] PT Assessment - 01/02/15 0845    Assessment   Medical Diagnosis Right Transfemoral Amputation   Referring Provider Rachell Cipro, MD   Onset Date/Surgical Date 12/09/14  Fall   Precautions   Precautions Fall   Precaution Comments No BP LUE   Restrictions   Weight Bearing Restrictions No   Balance Screen   Has the patient fallen in the past 6 months Yes   How many times? 1   Has the patient had a decrease in activity level because of a fear of falling?  No   Is the patient reluctant to leave their home because of a fear of falling?  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   Leisure bowling   Observation/Other Assessments   Focus on Therapeutic Outcomes (FOTO)  72   Other Surveys  Other Surveys   Fear Avoidance Belief Questionnaire (FABQ)  19   Posture/Postural Control   Posture/Postural Control Postural limitations   Postural Limitations Forward head;Weight shift left   ROM / Strength   AROM / PROM / Strength AROM;Strength   AROM   Overall AROM  Within functional limits for tasks performed   Overall AROM Comments Right hip extension full AROM Thomas position   Strength   Overall  Strength Within functional limits for tasks performed   Overall Strength Comments MMT for B hip, left knee & ankle 5/5   Transfers   Transfers Sit to Stand;Stand to Sit   Sit to Stand 6: Modified independent (Device/Increase time);With upper extremity assist;With armrests;From chair/3-in-1  requires UE, lost balance attempting without UEs   Stand to Sit 6: Modified independent (Device/Increase time);With upper extremity assist;To chair/3-in-1  does not use prosthesis, left LE only to control   Ambulation/Gait   Ambulation/Gait Yes   Ambulation/Gait Assistance 5: Supervision   Ambulation Distance (Feet) 200 Feet   Assistive device Prosthesis;None  C-leg Microprocessor prosthesis   Gait Pattern Step-through pattern;Decreased step length - left;Decreased stance time -  right;Decreased stride length;Decreased hip/knee flexion - right;Decreased weight shift to right;Right hip hike;Right circumduction;Abducted- right   Ambulation Surface Indoor;Level   Gait velocity 3.13 ft/sec comfortable; 3.34 ft/sec fast   Standardized Balance Assessment   Standardized Balance Assessment Berg Balance Test;Timed Up and Go Test   Berg Balance Test   Sit to Stand Able to stand  independently using hands   Standing Unsupported Able to stand safely 2 minutes   Sitting with Back Unsupported but Feet Supported on Floor or Stool Able to sit safely and securely 2 minutes   Stand to Sit Sits safely with minimal use of hands   Transfers Able to transfer safely, minor use of hands   Standing Unsupported with Eyes Closed Able to stand 10 seconds safely   Standing Ubsupported with Feet Together Able to place feet together independently and stand 1 minute safely   From Standing, Reach Forward with Outstretched Arm Can reach confidently >25 cm (10")   From Standing Position, Pick up Object from Floor Able to pick up shoe safely and easily   From Standing Position, Turn to Look Behind Over each Shoulder Looks behind from  both sides and weight shifts well   Turn 360 Degrees Able to turn 360 degrees safely but slowly   Standing Unsupported, Alternately Place Feet on Step/Stool Able to complete 4 steps without aid or supervision   Standing Unsupported, One Foot in Front Able to take small step independently and hold 30 seconds   Standing on One Leg Able to lift leg independently and hold 5-10 seconds   Total Score 48   Timed Up and Go Test   Normal TUG (seconds) 12.72   Functional Gait  Assessment   Gait assessed  Yes   Gait Level Surface Walks 20 ft in less than 7 sec but greater than 5.5 sec, uses assistive device, slower speed, mild gait deviations, or deviates 6-10 in outside of the 12 in walkway width.   Change in Gait Speed Able to change speed, demonstrates mild gait deviations, deviates 6-10 in outside of the 12 in walkway width, or no gait deviations, unable to achieve a major change in velocity, or uses a change in velocity, or uses an assistive device.   Gait with Horizontal Head Turns Performs head turns smoothly with slight change in gait velocity (eg, minor disruption to smooth gait path), deviates 6-10 in outside 12 in walkway width, or uses an assistive device.   Gait with Vertical Head Turns Performs task with moderate change in gait velocity, slows down, deviates 10-15 in outside 12 in walkway width but recovers, can continue to walk.   Gait and Pivot Turn Pivot turns safely in greater than 3 sec and stops with no loss of balance, or pivot turns safely within 3 sec and stops with mild imbalance, requires small steps to catch balance.   Step Over Obstacle Is able to step over one shoe box (4.5 in total height) without changing gait speed. No evidence of imbalance.   Gait with Narrow Base of Support Ambulates less than 4 steps heel to toe or cannot perform without assistance.   Gait with Eyes Closed Walks 20 ft, slow speed, abnormal gait pattern, evidence for imbalance, deviates 10-15 in outside 12 in  walkway width. Requires more than 9 sec to ambulate 20 ft.   Ambulating Backwards Walks 20 ft, slow speed, abnormal gait pattern, evidence for imbalance, deviates 10-15 in outside 12 in walkway width.   Steps Two feet to a  stair, must use rail.   Total Score 14                             PT Short Term Goals - 01/02/15 0930    PT SHORT TERM GOAL #1   Title Patient descends stairs with 2 rails using prosthesis to lower weight with step-to pattern with cues only. (Target Date: 01/30/2014)   Time 4   Period Weeks   Status New   PT SHORT TERM GOAL #2   Title Patient ambulates with head turns to scan environment maintaining path & pace with cues.  (Target Date: 01/30/2014)   Time 4   Period Weeks   Status New   PT SHORT TERM GOAL #3   Title Patient ambulates 300' with prosthesis only with cues for deviations.  (Target Date: 01/30/2014)   Time 4   Period Weeks   Status New   PT SHORT TERM GOAL #4   Title Patient demonstrates updated HEP to facilitate stability for balance activities and decrease gait deviations.  (Target Date: 01/30/2014)   Time 4   Period Weeks   Status New           PT Long Term Goals - 01/02/15 0930    PT LONG TERM GOAL #1   Title Patient ambulates 500' with prosthesis without device in less 3 minutes independently.  (Target Date: 02/27/2014)   Time 8   Period Weeks   Status New   PT LONG TERM GOAL #2   Title Patient negotiates ramps & curbs with prosthesis only and descend stairs with 1 rail modified independent.   (Target Date: 02/27/2014)   Time 8   Period Weeks   Status New   PT LONG TERM GOAL #3   Title Berg Balance >52/56 to decrease fall risk  (Target Date: 02/27/2014)   Time 8   Period Weeks   Status New   PT LONG TERM GOAL #4   Title Functional Gait Assessment >20/30 to decrease fall risk.  (Target Date: 02/27/2014)   Time 8   Period Weeks   Status New               Plan - 01/02/15 0930    Clinical Impression Statement This  44yo male has a Transfemoral Amputation prosthesis with goal to increase function & decrease risk / fear of falling. He is ambulating with prosthesis only with gait deviations increasing fall risk especially with barriers.Marland Kitchen His Berg Balance 48/56 and Functional Gait Assessment 14/30 indicate fall risk. Patient would benefit from skilled PT to instruct in HEP / activities to progress balance & mobility to safe level.    Pt will benefit from skilled therapeutic intervention in order to improve on the following deficits Abnormal gait;Decreased activity tolerance;Decreased balance;Decreased endurance;Decreased mobility   Rehab Potential Good   PT Frequency 1x / week   PT Duration 8 weeks   PT Treatment/Interventions ADLs/Self Care Home Management;Gait training;Stair training;Functional mobility training;Therapeutic activities;Therapeutic exercise;Balance training;Neuromuscular re-education;Patient/family education;Prosthetic Training   PT Next Visit Plan stairs technique using prosthesis to descend using C-leg with 2 rails; standing balance HEP   Consulted and Agree with Plan of Care Patient         Problem List Patient Active Problem List   Diagnosis Date Noted  . Acute blood loss anemia 06/25/2013  . Hyperglycemia 06/07/2013  . Arm DVT (deep venous thromboembolism), acute ([facility] [place]) 06/07/2013  . ESRD on dialysis ([facility]) 06/05/2013  .  Encephalopathy, toxic 06/03/2013  . Cellulitis and abscess of leg 06/03/2013  . Anemia in chronic kidney disease 06/03/2013  . Arm edema 06/03/2013  . Leg abscess 06/03/2013  . Severe sepsis ([place]) 06/03/2013  . Septic shock ([place]) 06/03/2013  . Septic arthritis of ankle or foot, right 06/03/2013  . Abscess of right lower leg 06/02/2013  . Wrist lump 07/05/2012  . Swelling of limb-Left index and middle fingers 07/05/2012  . Aftercare following surgery of the circulatory system, [place] 07/05/2012  . Tingling-left wrist and some pain. 07/05/2012  . Pulmonary edema  05/04/2012  . Acute respiratory failure ([place]) 05/04/2012  . CKD (chronic kidney disease) stage 5, GFR less than 15 ml/min (HCC) 05/04/2012  . Hyperkalemia 05/04/2012  . Pre-operative cardiovascular examination 03/15/2012  . Iron deficiency anemia 09/22/2010  . HTN (hypertension) 09/22/2010  . Hyperlipidemia 09/22/2010  . Gout 09/22/2010  . CRI (chronic renal insufficiency) 09/22/2010  . Pulmonary embolism, bilateral ([place]) 09/22/2010    Lavette Yankovich PT, DPT 01/02/2015, 3:16 PM  [facility] [address] [place], [place], [address] Phone: [phone]   Fax:  [phone]  Name: Steven Logan MRN: [id number] Date of Birth: Jan 19, 1971

## 2015-01-07 ENCOUNTER — Ambulatory Visit: Payer: BLUE CROSS/BLUE SHIELD | Admitting: Physical Therapy

## 2015-01-14 ENCOUNTER — Encounter: Payer: Self-pay | Admitting: Physical Therapy

## 2015-01-14 ENCOUNTER — Ambulatory Visit: Payer: BLUE CROSS/BLUE SHIELD | Admitting: Physical Therapy

## 2015-01-14 DIAGNOSIS — Z7409 Other reduced mobility: Secondary | ICD-10-CM

## 2015-01-14 DIAGNOSIS — R269 Unspecified abnormalities of gait and mobility: Secondary | ICD-10-CM | POA: Diagnosis not present

## 2015-01-14 DIAGNOSIS — R2681 Unsteadiness on feet: Secondary | ICD-10-CM

## 2015-01-14 DIAGNOSIS — Z89611 Acquired absence of right leg above knee: Secondary | ICD-10-CM

## 2015-01-14 DIAGNOSIS — Z4789 Encounter for other orthopedic aftercare: Secondary | ICD-10-CM

## 2015-01-14 DIAGNOSIS — R2689 Other abnormalities of gait and mobility: Secondary | ICD-10-CM

## 2015-01-14 NOTE — Therapy (Signed)
[facility] [address] [place], [place], [address] Phone: [phone]   Fax:  [phone]  Physical Therapy Treatment  Patient Details  Name: Steven Logan MRN: [id number] Date of Birth: 04/22/1970 Referring Provider: Rachell Cipro, MD  Encounter Date: 01/14/2015      PT End of Session - 01/14/15 0930    Visit Number 2   Number of Visits 9   Date for PT Re-Evaluation 02/27/14   PT Start Time 0846   PT Stop Time 0930   PT Time Calculation (min) 44 min   Equipment Utilized During Treatment Gait belt   Activity Tolerance Patient tolerated treatment well   Behavior During Therapy [facility] for tasks assessed/performed      Past Medical History  Diagnosis Date  . Eczema   . Morbid obesity ([place])   . Gout   . HTN (hypertension)   . Dyslipidemia   . Pulmonary embolism ([place])     3  in  lungs  at  one time...  . Abscess     great toe  . Pancreatitis   . Arthritis   . Depression   . Diabetes mellitus   . Anemia   . Pneumonia     2-3 years ago  . Shortness of breath     ?? chest  cold he has now.  10/8- no SOB  . Renal hypertension     Hemo started  Sept 2014- MWF  . Chronic kidney disease     esrd    Past Surgical History  Procedure Laterality Date  . None    . Av fistula placement Left 04/07/2012    Procedure: ARTERIOVENOUS (AV) FISTULA CREATION;  Surgeon: Angelia Mould, MD;  Location: [facility];  Service: Vascular;  Laterality: Left;  . Ligation of competing branches of arteriovenous fistula Left 09/19/2012    Procedure: LIGATION OF COMPETING BRANCHES OF LEFT ARM ARTERIOVENOUS FISTULA; Vein angioplasty;  Surgeon: Angelia Mould, MD;  Location: [facility];  Service: Vascular;  Laterality: Left;  . Insertion of dialysis catheter Right 09/19/2012    Procedure: INSERTION OF DIALYSIS CATHETER;  Surgeon: Angelia Mould, MD;  Location: [facility];  Service: Vascular;  Laterality: Right;  . I&d extremity  Right 06/02/2013    Procedure: IRRIGATION AND DEBRIDEMENT ARTHROSCOPIC RIGHT ANKLE;  Surgeon: Augustin Schooling, MD;  Location: [facility];  Service: Orthopedics;  Laterality: Right;  . I&d extremity Right 06/04/2013    Procedure: IRRIGATION AND DEBRIDEMENT RIGHT FOOT/ANKLE;  Surgeon: Newt Minion, MD;  Location: [facility];  Service: Orthopedics;  Laterality: Right;  . I&d extremity Right 06/06/2013    Procedure: IRRIGATION AND DEBRIDEMENT EXTREMITY;  Surgeon: Newt Minion, MD;  Location: [facility];  Service: Orthopedics;  Laterality: Right;  . Amputation Right 06/12/2013    Procedure: Transtibial Amputation;  Surgeon: Newt Minion, MD;  Location: [facility];  Service: Orthopedics;  Laterality: Right;  . I&d extremity Right 06/24/2013    Procedure: IRRIGATION AND DEBRIDEMENT and Revsion of TRANSTIBIAL AMPUTATION;  Surgeon: Newt Minion, MD;  Location: [facility];  Service: Orthopedics;  Laterality: Right;  . Amputation Right 06/27/2013    Procedure: AMPUTATION ABOVE KNEE;  Surgeon: Newt Minion, MD;  Location: [facility];  Service: Orthopedics;  Laterality: Right;  Right Above Knee Amputation  . Fistulogram Left 08/07/2012    Procedure: FISTULOGRAM;  Surgeon: Angelia Mould, MD;  Location: [facility] C[facility];  Service: Cardiovascular;  Laterality: Left;  arm  There were no vitals filed for this visit.  Visit Diagnosis:  Abnormality of gait  Impaired functional mobility and activity tolerance  Balance problems  Status post above knee amputation of right lower extremity ([place])  Unsteadiness  Encounter for prosthetic gait training      Subjective Assessment - 01/14/15 0845    Subjective No falls. He still has loaner C-leg until his knee is repaired. Plans to join a gym at beginning of new year.    Currently in Pain? No/denies     Prosthetic Training: PT instructed with demo proper orientation of prosthetic knee to line of progression and pelvis orientation to decrease internal rotation of socket on limb creating  gait deviations. Pt verbalized understanding.  Therapeutic Exercise: Treadmill - PT demo, instructed safe mounting / dismounting, speed control and use of safety switch. Recommendation for UE support but focus on light pressure for safety. Patient able to safely mount, start, stop & dismount treadmill. He ambulated 5 minutes at 1.9mph with cues on increasing prosthetic stance duration and longer left LE step length, upright posture. Pt verbalized understanding. Leg Press - PT demo, instructed in proper set-up, weight choice and technique to engage prosthesis. Pt performed 15 reps 80# with tactile & verbal cues to engage prosthesis. UE chest pull- PT demo, instructed in proper set-up, weight choice, and proper posture. Patient performed 15reps 30# with verbal cues.  Stationery Bike - PT demo, instructed in proper prosthetic foot position and seat height to engage prosthesis. Patient performed 3 minutes with control of prosthesis.                             PT Education - 01/14/15 0930    Education provided Yes   Education Details differences in gyms with equipment vs classes vs pool; use of treadmill, LE & UE wt machines and stationery bike with prosthesis.    Person(s) Educated Patient   Methods Explanation;Demonstration;Tactile cues;Verbal cues   Comprehension Verbalized understanding;Returned demonstration;Verbal cues required;Tactile cues required;Need further instruction          PT Short Term Goals - 01/02/15 0930    PT SHORT TERM GOAL #1   Title Patient descends stairs with 2 rails using prosthesis to lower weight with step-to pattern with cues only. (Target Date: 01/30/2014)   Time 4   Period Weeks   Status New   PT SHORT TERM GOAL #2   Title Patient ambulates with head turns to scan environment maintaining path & pace with cues.  (Target Date: 01/30/2014)   Time 4   Period Weeks   Status New   PT SHORT TERM GOAL #3   Title Patient ambulates 300' with  prosthesis only with cues for deviations.  (Target Date: 01/30/2014)   Time 4   Period Weeks   Status New   PT SHORT TERM GOAL #4   Title Patient demonstrates updated HEP to facilitate stability for balance activities and decrease gait deviations.  (Target Date: 01/30/2014)   Time 4   Period Weeks   Status New           PT Long Term Goals - 01/02/15 0930    PT LONG TERM GOAL #1   Title Patient ambulates 500' with prosthesis without device in less 3 minutes independently.  (Target Date: 02/27/2014)   Time 8   Period Weeks   Status New   PT LONG TERM GOAL #2   Title Patient negotiates ramps & curbs  with prosthesis only and descend stairs with 1 rail modified independent.   (Target Date: 02/27/2014)   Time 8   Period Weeks   Status New   PT LONG TERM GOAL #3   Title Berg Balance >52/56 to decrease fall risk  (Target Date: 02/27/2014)   Time 8   Period Weeks   Status New   PT LONG TERM GOAL #4   Title Functional Gait Assessment >20/30 to decrease fall risk.  (Target Date: 02/27/2014)   Time 8   Period Weeks   Status New               Plan - 01/14/15 0930    Clinical Impression Statement Patient appears to have general understanding of use of exercise equipment with prosthesis to safely begin a fitness program. Patient verbalized better understanding of proper rotation with donning prosthesis.    Pt will benefit from skilled therapeutic intervention in order to improve on the following deficits Abnormal gait;Decreased activity tolerance;Decreased balance;Decreased endurance;Decreased mobility   Rehab Potential Good   PT Frequency 1x / week   PT Duration 8 weeks   PT Treatment/Interventions ADLs/Self Care Home Management;Gait training;Stair training;Functional mobility training;Therapeutic activities;Therapeutic exercise;Balance training;Neuromuscular re-education;Patient/family education;Prosthetic Training   PT Next Visit Plan stairs technique using prosthesis to descend using  C-leg with 2 rails; standing balance HEP   Consulted and Agree with Plan of Care Patient        Problem List Patient Active Problem List   Diagnosis Date Noted  . Acute blood loss anemia 06/25/2013  . Hyperglycemia 06/07/2013  . Arm DVT (deep venous thromboembolism), acute ([place]) 06/07/2013  . ESRD on dialysis ([place]) 06/05/2013  . Encephalopathy, toxic 06/03/2013  . Cellulitis and abscess of leg 06/03/2013  . Anemia in chronic kidney disease 06/03/2013  . Arm edema 06/03/2013  . Leg abscess 06/03/2013  . Severe sepsis ([place]) 06/03/2013  . Septic shock ([place]) 06/03/2013  . Septic arthritis of ankle or foot, right 06/03/2013  . Abscess of right lower leg 06/02/2013  . Wrist lump 07/05/2012  . Swelling of limb-Left index and middle fingers 07/05/2012  . Aftercare following surgery of the circulatory system, [place] 07/05/2012  . Tingling-left wrist and some pain. 07/05/2012  . Pulmonary edema 05/04/2012  . Acute respiratory failure ([place]) 05/04/2012  . CKD (chronic kidney disease) stage 5, GFR less than 15 ml/min (H[facility]) 05/04/2012  . Hyperkalemia 05/04/2012  . Pre-operative cardiovascular examination 03/15/2012  . Iron deficiency anemia 09/22/2010  . HTN (hypertension) 09/22/2010  . Hyperlipidemia 09/22/2010  . Gout 09/22/2010  . CRI (chronic renal insufficiency) 09/22/2010  . Pulmonary embolism, bilateral ([place]) 09/22/2010    Anay Rathe PT, DPT 01/14/2015, 11:37 AM  [facility] [address] [place], [place], [address] Phone: [phone]   Fax:  [phone]  Name: MACLAREN ARROYAVE MRN: [id number] Date of Birth: Dec 12, 1970

## 2015-01-21 ENCOUNTER — Ambulatory Visit: Payer: BLUE CROSS/BLUE SHIELD | Admitting: Physical Therapy

## 2015-01-28 ENCOUNTER — Ambulatory Visit: Payer: BLUE CROSS/BLUE SHIELD | Attending: Family Medicine | Admitting: Physical Therapy

## 2015-01-28 ENCOUNTER — Encounter: Payer: Self-pay | Admitting: Physical Therapy

## 2015-01-28 DIAGNOSIS — Z89611 Acquired absence of right leg above knee: Secondary | ICD-10-CM | POA: Diagnosis present

## 2015-01-28 DIAGNOSIS — Z4789 Encounter for other orthopedic aftercare: Secondary | ICD-10-CM

## 2015-01-28 DIAGNOSIS — Z5189 Encounter for other specified aftercare: Secondary | ICD-10-CM | POA: Insufficient documentation

## 2015-01-28 DIAGNOSIS — R2689 Other abnormalities of gait and mobility: Secondary | ICD-10-CM

## 2015-01-28 DIAGNOSIS — R29818 Other symptoms and signs involving the nervous system: Secondary | ICD-10-CM | POA: Insufficient documentation

## 2015-01-28 DIAGNOSIS — R269 Unspecified abnormalities of gait and mobility: Secondary | ICD-10-CM | POA: Diagnosis present

## 2015-01-28 DIAGNOSIS — Z7409 Other reduced mobility: Secondary | ICD-10-CM | POA: Diagnosis present

## 2015-01-28 DIAGNOSIS — R2681 Unsteadiness on feet: Secondary | ICD-10-CM | POA: Insufficient documentation

## 2015-01-28 NOTE — Therapy (Signed)
[facility] [address] [place], [place], [address] Phone: [phone]   Fax:  [phone]  Physical Therapy Treatment  Patient Details  Name: Steven Logan MRN: [id number] Date of Birth: December 19, 1970 Referring Provider: Rachell Cipro, MD  Encounter Date: 01/28/2015      PT End of Session - 01/28/15 1015    Visit Number 3   Number of Visits 9   Date for PT Re-Evaluation 02/27/14   PT Start Time 0935   PT Stop Time 1015   PT Time Calculation (min) 40 min   Equipment Utilized During Treatment Gait belt   Activity Tolerance Patient tolerated treatment well   Behavior During Therapy [facility] for tasks assessed/performed      Past Medical History  Diagnosis Date  . Eczema   . Morbid obesity ([place])   . Gout   . HTN (hypertension)   . Dyslipidemia   . Pulmonary embolism ([place])     3  in  lungs  at  one time...  . Abscess     great toe  . Pancreatitis   . Arthritis   . Depression   . Diabetes mellitus   . Anemia   . Pneumonia     2-3 years ago  . Shortness of breath     ?? chest  cold he has now.  10/8- no SOB  . Renal hypertension     Hemo started  Sept 2014- MWF  . Chronic kidney disease     esrd    Past Surgical History  Procedure Laterality Date  . None    . Av fistula placement Left 04/07/2012    Procedure: ARTERIOVENOUS (AV) FISTULA CREATION;  Surgeon: Angelia Mould, MD;  Location: [facility];  Service: Vascular;  Laterality: Left;  . Ligation of competing branches of arteriovenous fistula Left 09/19/2012    Procedure: LIGATION OF COMPETING BRANCHES OF LEFT ARM ARTERIOVENOUS FISTULA; Vein angioplasty;  Surgeon: Angelia Mould, MD;  Location: [facility];  Service: Vascular;  Laterality: Left;  . Insertion of dialysis catheter Right 09/19/2012    Procedure: INSERTION OF DIALYSIS CATHETER;  Surgeon: Angelia Mould, MD;  Location: [facility];  Service: Vascular;  Laterality: Right;  . I&d extremity  Right 06/02/2013    Procedure: IRRIGATION AND DEBRIDEMENT ARTHROSCOPIC RIGHT ANKLE;  Surgeon: Augustin Schooling, MD;  Location: [facility];  Service: Orthopedics;  Laterality: Right;  . I&d extremity Right 06/04/2013    Procedure: IRRIGATION AND DEBRIDEMENT RIGHT FOOT/ANKLE;  Surgeon: Newt Minion, MD;  Location: [facility];  Service: Orthopedics;  Laterality: Right;  . I&d extremity Right 06/06/2013    Procedure: IRRIGATION AND DEBRIDEMENT EXTREMITY;  Surgeon: Newt Minion, MD;  Location: [facility];  Service: Orthopedics;  Laterality: Right;  . Amputation Right 06/12/2013    Procedure: Transtibial Amputation;  Surgeon: Newt Minion, MD;  Location: [facility];  Service: Orthopedics;  Laterality: Right;  . I&d extremity Right 06/24/2013    Procedure: IRRIGATION AND DEBRIDEMENT and Revsion of TRANSTIBIAL AMPUTATION;  Surgeon: Newt Minion, MD;  Location: [facility];  Service: Orthopedics;  Laterality: Right;  . Amputation Right 06/27/2013    Procedure: AMPUTATION ABOVE KNEE;  Surgeon: Newt Minion, MD;  Location: [facility];  Service: Orthopedics;  Laterality: Right;  Right Above Knee Amputation  . Fistulogram Left 08/07/2012    Procedure: FISTULOGRAM;  Surgeon: Angelia Mould, MD;  Location: [facility] CATH LAB;  Service: Cardiovascular;  Laterality: Left;  arm  There were no vitals filed for this visit.  Visit Diagnosis:  Abnormality of gait  Impaired functional mobility and activity tolerance  Balance problems  Status post above knee amputation of right lower extremity ([place])  Unsteadiness  Encounter for prosthetic gait training      Subjective Assessment - 01/28/15 0938    Subjective No falls. He still has Hotel manager.                          [facility] Adult PT Treatment/Exercise - 01/28/15 0930    Transfers   Sit to Stand 5: Supervision;With upper extremity assist   Sit to Stand Details (indicate cue type and reason) verbal & visual cues to engage hydraulics on prosthesis; also how this  technique is same as lifting wts or objects to the floor.    Stand to Sit 5: Supervision;With upper extremity assist   Stand to Sit Details verbal & visual cues on technique to engage prosthesis. PT instructed how this technique carries over to lowering objects / weights to floor.    Ambulation/Gait   Ambulation/Gait Yes   Ambulation/Gait Assistance 5: Supervision   Ambulation/Gait Assistance Details verbal & tactile cues on decreasing abduction & prosthetic knee flexion in swing with rolling off toe in terminal stance. Worked on carrying 10# with handles in each UE with cues to engage prosthesis.    Ambulation Distance (Feet) 300 Feet  300' X 2   Assistive device Prosthesis;None   Ambulation Surface Indoor;Level   Stairs Yes   Stairs Assistance 5: Supervision   Stairs Assistance Details (indicate cue type and reason) PT demo, verbal & tactile cues for technique to engage hydraulics of prothetic knee descending. initiated w/ 2rails, left rail then right rail step-to pattern, progressed to alternating with 2 rails only   Stair Management Technique Two rails;One rail Left;One rail Right;Step to pattern;Alternating pattern;Forwards   Number of Stairs 5  >20 reps    Ramp 5: Supervision   Ramp Details (indicate cue type and reason) verbal & tactile cues on flexing knee to ascend with advancing & descending lowering wt; practiced with mirror & between 2 chairbacks on 12* incline block.    Therapeutic Activites    Therapeutic Activities Lifting   Lifting cues on using sit to/from stand technique to lift /lower wts. Patient lifts / lowers to floor 2 10# wts with handles with supervision.                   PT Short Term Goals - 01/28/15 1015    PT SHORT TERM GOAL #1   Title Patient descends stairs with 2 rails using prosthesis to lower weight with step-to pattern with cues only. (Target Date: 01/31/2015)   Baseline MET 01/28/2015   Time 4   Period Weeks   Status Achieved   PT SHORT TERM  GOAL #2   Title Patient ambulates with head turns to scan environment maintaining path & pace with cues.  (Target Date: 01/31/2015)   Baseline MET 01/28/2015   Time 4   Period Weeks   Status Achieved   PT SHORT TERM GOAL #3   Title Patient ambulates 300' with prosthesis only with cues for deviations.  (Target Date: 01/31/2015)   Baseline MET 01/28/2015   Time 4   Period Weeks   Status Achieved   PT SHORT TERM GOAL #4   Title Patient demonstrates updated HEP to facilitate stability for balance activities and decrease gait deviations.  (  Target Date: 01/31/2015)   Baseline MET 01/28/2015   Time 4   Period Weeks   Status Achieved           PT Long Term Goals - 01/28/15 1015    PT LONG TERM GOAL #1   Title Patient ambulates 500' with prosthesis without device in less 3 minutes independently.  (Target Date: 02/28/2015)   Time 8   Period Weeks   Status On-going   PT LONG TERM GOAL #2   Title Patient negotiates ramps & curbs with prosthesis only and descend stairs with 1 rail modified independent.   (Target Date: 02/28/2015)   Time 8   Period Weeks   Status On-going   PT LONG TERM GOAL #3   Title Berg Balance >52/56 to decrease fall risk  (Target Date: 02/28/2015)   Time 8   Period Weeks   Status On-going   PT LONG TERM GOAL #4   Title Functional Gait Assessment >20/30 to decrease fall risk.  (Target Date: 02/28/2015)   Time 8   Period Weeks   Status On-going               Plan - 01/28/15 1015    Clinical Impression Statement Patient met all STGs for the first month of certification period. He improved prosthetic knee control descending stairs with instruction and repetition.    Pt will benefit from skilled therapeutic intervention in order to improve on the following deficits Abnormal gait;Decreased activity tolerance;Decreased balance;Decreased endurance;Decreased mobility   Rehab Potential Good   PT Frequency 1x / week   PT Duration 8 weeks   PT Treatment/Interventions ADLs/Self  Care Home Management;Gait training;Stair training;Functional mobility training;Therapeutic activities;Therapeutic exercise;Balance training;Neuromuscular re-education;Patient/family education;Prosthetic Training   PT Next Visit Plan stairs technique using prosthesis to descend using C-leg; work on ramps & curbs; standing balance HEP   Consulted and Agree with Plan of Care Patient        Problem List Patient Active Problem List   Diagnosis Date Noted  . Acute blood loss anemia 06/25/2013  . Hyperglycemia 06/07/2013  . Arm DVT (deep venous thromboembolism), acute ([place]) 06/07/2013  . ESRD on dialysis ([place]) 06/05/2013  . Encephalopathy, toxic 06/03/2013  . Cellulitis and abscess of leg 06/03/2013  . Anemia in chronic kidney disease 06/03/2013  . Arm edema 06/03/2013  . Leg abscess 06/03/2013  . Severe sepsis ([place]) 06/03/2013  . Septic shock ([place]) 06/03/2013  . Septic arthritis of ankle or foot, right 06/03/2013  . Abscess of right lower leg 06/02/2013  . Wrist lump 07/05/2012  . Swelling of limb-Left index and middle fingers 07/05/2012  . Aftercare following surgery of the circulatory system, [place] 07/05/2012  . Tingling-left wrist and some pain. 07/05/2012  . Pulmonary edema 05/04/2012  . Acute respiratory failure ([place]) 05/04/2012  . CKD (chronic kidney disease) stage 5, GFR less than 15 ml/min (H[facility]) 05/04/2012  . Hyperkalemia 05/04/2012  . Pre-operative cardiovascular examination 03/15/2012  . Iron deficiency anemia 09/22/2010  . HTN (hypertension) 09/22/2010  . Hyperlipidemia 09/22/2010  . Gout 09/22/2010  . CRI (chronic renal insufficiency) 09/22/2010  . Pulmonary embolism, bilateral ([place]) 09/22/2010    Taya Ashbaugh PT, DPT 01/28/2015, 1:42 PM  [facility] [address] [place], [place], [address] Phone: [phone]   Fax:  [phone]  Name: JAHAAN VANWAGNER MRN: [id number] Date of Birth: 12-22-1970

## 2015-02-04 ENCOUNTER — Ambulatory Visit: Payer: BLUE CROSS/BLUE SHIELD | Admitting: Physical Therapy

## 2015-02-06 ENCOUNTER — Ambulatory Visit: Payer: BLUE CROSS/BLUE SHIELD | Admitting: Physical Therapy

## 2015-02-11 ENCOUNTER — Encounter: Payer: Self-pay | Admitting: Physical Therapy

## 2015-02-11 ENCOUNTER — Ambulatory Visit: Payer: BLUE CROSS/BLUE SHIELD | Admitting: Physical Therapy

## 2015-02-11 DIAGNOSIS — Z4789 Encounter for other orthopedic aftercare: Secondary | ICD-10-CM

## 2015-02-11 DIAGNOSIS — R269 Unspecified abnormalities of gait and mobility: Secondary | ICD-10-CM | POA: Diagnosis not present

## 2015-02-11 DIAGNOSIS — Z7409 Other reduced mobility: Secondary | ICD-10-CM

## 2015-02-11 DIAGNOSIS — Z89611 Acquired absence of right leg above knee: Secondary | ICD-10-CM

## 2015-02-11 DIAGNOSIS — R2681 Unsteadiness on feet: Secondary | ICD-10-CM

## 2015-02-11 DIAGNOSIS — R2689 Other abnormalities of gait and mobility: Secondary | ICD-10-CM

## 2015-02-12 NOTE — Therapy (Signed)
[facility] [address] [place], [place], [address] Phone: [phone]   Fax:  [phone]  Physical Therapy Treatment  Patient Details  Name: Steven Logan MRN: [id number] Date of Birth: 12/11/1970 Referring Provider: Rachell Cipro, MD  Encounter Date: 02/11/2015      PT End of Session - 02/11/15 0930    Visit Number 4   Number of Visits 9   Date for PT Re-Evaluation 02/27/14   PT Start Time 0845   PT Stop Time 0930   PT Time Calculation (min) 45 min   Equipment Utilized During Treatment Gait belt   Activity Tolerance Patient tolerated treatment well   Behavior During Therapy [facility] for tasks assessed/performed      Past Medical History  Diagnosis Date  . Eczema   . Morbid obesity ([place])   . Gout   . HTN (hypertension)   . Dyslipidemia   . Pulmonary embolism ([place])     3  in  lungs  at  one time...  . Abscess     great toe  . Pancreatitis   . Arthritis   . Depression   . Diabetes mellitus   . Anemia   . Pneumonia     2-3 years ago  . Shortness of breath     ?? chest  cold he has now.  10/8- no SOB  . Renal hypertension     Hemo started  Sept 2014- MWF  . Chronic kidney disease     esrd    Past Surgical History  Procedure Laterality Date  . None    . Av fistula placement Left 04/07/2012    Procedure: ARTERIOVENOUS (AV) FISTULA CREATION;  Surgeon: Angelia Mould, MD;  Location: [facility];  Service: Vascular;  Laterality: Left;  . Ligation of competing branches of arteriovenous fistula Left 09/19/2012    Procedure: LIGATION OF COMPETING BRANCHES OF LEFT ARM ARTERIOVENOUS FISTULA; Vein angioplasty;  Surgeon: Angelia Mould, MD;  Location: [facility];  Service: Vascular;  Laterality: Left;  . Insertion of dialysis catheter Right 09/19/2012    Procedure: INSERTION OF DIALYSIS CATHETER;  Surgeon: Angelia Mould, MD;  Location: [facility];  Service: Vascular;  Laterality: Right;  . I&d extremity  Right 06/02/2013    Procedure: IRRIGATION AND DEBRIDEMENT ARTHROSCOPIC RIGHT ANKLE;  Surgeon: Augustin Schooling, MD;  Location: [facility];  Service: Orthopedics;  Laterality: Right;  . I&d extremity Right 06/04/2013    Procedure: IRRIGATION AND DEBRIDEMENT RIGHT FOOT/ANKLE;  Surgeon: Newt Minion, MD;  Location: [facility];  Service: Orthopedics;  Laterality: Right;  . I&d extremity Right 06/06/2013    Procedure: IRRIGATION AND DEBRIDEMENT EXTREMITY;  Surgeon: Newt Minion, MD;  Location: [facility];  Service: Orthopedics;  Laterality: Right;  . Amputation Right 06/12/2013    Procedure: Transtibial Amputation;  Surgeon: Newt Minion, MD;  Location: [facility];  Service: Orthopedics;  Laterality: Right;  . I&d extremity Right 06/24/2013    Procedure: IRRIGATION AND DEBRIDEMENT and Revsion of TRANSTIBIAL AMPUTATION;  Surgeon: Newt Minion, MD;  Location: [facility];  Service: Orthopedics;  Laterality: Right;  . Amputation Right 06/27/2013    Procedure: AMPUTATION ABOVE KNEE;  Surgeon: Newt Minion, MD;  Location: [facility];  Service: Orthopedics;  Laterality: Right;  Right Above Knee Amputation  . Fistulogram Left 08/07/2012    Procedure: FISTULOGRAM;  Surgeon: Angelia Mould, MD;  Location: [facility] CATH LAB;  Service: Cardiovascular;  Laterality: Left;  arm  There were no vitals filed for this visit.  Visit Diagnosis:  Abnormality of gait  Impaired functional mobility and activity tolerance  Balance problems  Status post above knee amputation of right lower extremity ([place])  Unsteadiness  Encounter for prosthetic gait training      Subjective Assessment - 02/11/15 0845    Subjective He has not found time to exercise much. His C-leg is still not back yet so using loaner.    Currently in Pain? No/denies                         [facility] Adult PT Treatment/Exercise - 02/11/15 0845    Transfers   Sit to Stand 5: Supervision;With upper extremity assist   Sit to Stand Details (indicate cue type and  reason) cues to C-leg   Stand to Sit 5: Supervision;With upper extremity assist   Stand to Sit Details cues to engage C-leg   Ambulation/Gait   Ambulation/Gait Yes   Ambulation/Gait Assistance 5: Supervision   Ambulation/Gait Assistance Details verbal cues on flexing prosthetic knee in terminal stance and loading heel at initial contact with wt transfer   Ambulation Distance (Feet) 300 Feet  300' X 2   Assistive device Prosthesis;None   Gait Pattern Step-through pattern;Decreased step length - left;Decreased stance time - right;Decreased stride length;Decreased hip/knee flexion - right;Decreased weight shift to right;Right hip hike;Right circumduction;Abducted- right   Ambulation Surface Indoor;Level   Stairs Yes   Stairs Assistance 5: Supervision;4: Min guard   Stairs Assistance Details (indicate cue type and reason) Tactile, verbal cues on technique to engage C-leg with descent, use of right rail & left forearm crutch, ascend taking 2 steps at time with step to pattern   Stair Management Technique One rail Right;Step to pattern;Forwards;With crutches   Number of Stairs 5  >10 reps   Ramp 5: Supervision  1 crutch & prosthesis   Ramp Details (indicate cue type and reason) verbal & tactile cues on engaging C-leg descend & flexing knee to ascend   Curb 5: Supervision  prosthesis, initially with rail contact   Curb Details (indicate cue type and reason) cues on foot position to clear prosthesis easier ascending & proper step length / position to descend to load prosthesis   Pre-Gait Activities Treadmill: 2.58mh 2 min 3 sets wiht 2 min rest in between. PT instructed how to safely step on/off treadmill belt & pt return demo /verbalized understanding   Therapeutic Activites    Therapeutic Activities --   Lifting --                PT Education - 02/11/15 0930    Education provided Yes   Education Details Treadmill 2.036m 52m79msets with 2 min rest with goal 5 sets.    Person(s)  Educated Patient   Methods Explanation;Demonstration;Tactile cues;Verbal cues   Comprehension Returned demonstration;Verbalized understanding;Verbal cues required;Tactile cues required          PT Short Term Goals - 01/28/15 1015    PT SHORT TERM GOAL #1   Title Patient descends stairs with 2 rails using prosthesis to lower weight with step-to pattern with cues only. (Target Date: 01/31/2015)   Baseline MET 01/28/2015   Time 4   Period Weeks   Status Achieved   PT SHORT TERM GOAL #2   Title Patient ambulates with head turns to scan environment maintaining path & pace with cues.  (Target Date: 01/31/2015)   Baseline MET 01/28/2015   Time  4   Period Weeks   Status Achieved   PT SHORT TERM GOAL #3   Title Patient ambulates 300' with prosthesis only with cues for deviations.  (Target Date: 01/31/2015)   Baseline MET 01/28/2015   Time 4   Period Weeks   Status Achieved   PT SHORT TERM GOAL #4   Title Patient demonstrates updated HEP to facilitate stability for balance activities and decrease gait deviations.  (Target Date: 01/31/2015)   Baseline MET 01/28/2015   Time 4   Period Weeks   Status Achieved           PT Long Term Goals - 01/28/15 1015    PT LONG TERM GOAL #1   Title Patient ambulates 500' with prosthesis without device in less 3 minutes independently.  (Target Date: 02/28/2015)   Time 8   Period Weeks   Status On-going   PT LONG TERM GOAL #2   Title Patient negotiates ramps & curbs with prosthesis only and descend stairs with 1 rail modified independent.   (Target Date: 02/28/2015)   Time 8   Period Weeks   Status On-going   PT LONG TERM GOAL #3   Title Berg Balance >52/56 to decrease fall risk  (Target Date: 02/28/2015)   Time 8   Period Weeks   Status On-going   PT LONG TERM GOAL #4   Title Functional Gait Assessment >20/30 to decrease fall risk.  (Target Date: 02/28/2015)   Time 8   Period Weeks   Status On-going               Plan - 02/11/15 0930    Clinical  Impression Statement Patient appears safe to use Treadmill after instruction in proper technique for him to step on/off belt.He was able to improve fluency of gait with improved flexion of prosthesis in swing, increaesed stance time enabling longer left step length.    Pt will benefit from skilled therapeutic intervention in order to improve on the following deficits Abnormal gait;Decreased activity tolerance;Decreased balance;Decreased endurance;Decreased mobility   Rehab Potential Good   PT Frequency 1x / week   PT Duration 8 weeks   PT Treatment/Interventions ADLs/Self Care Home Management;Gait training;Stair training;Functional mobility training;Therapeutic activities;Therapeutic exercise;Balance training;Neuromuscular re-education;Patient/family education;Prosthetic Training   PT Next Visit Plan stairs technique using prosthesis to descend using C-leg; work on ramps & curbs; standing balance HEP   Consulted and Agree with Plan of Care Patient        Problem List Patient Active Problem List   Diagnosis Date Noted  . Acute blood loss anemia 06/25/2013  . Hyperglycemia 06/07/2013  . Arm DVT (deep venous thromboembolism), acute ([place]) 06/07/2013  . ESRD on dialysis ([place]) 06/05/2013  . Encephalopathy, toxic 06/03/2013  . Cellulitis and abscess of leg 06/03/2013  . Anemia in chronic kidney disease 06/03/2013  . Arm edema 06/03/2013  . Leg abscess 06/03/2013  . Severe sepsis ([place]) 06/03/2013  . Septic shock ([place]) 06/03/2013  . Septic arthritis of ankle or foot, right 06/03/2013  . Abscess of right lower leg 06/02/2013  . Wrist lump 07/05/2012  . Swelling of limb-Left index and middle fingers 07/05/2012  . Aftercare following surgery of the circulatory system, [place] 07/05/2012  . Tingling-left wrist and some pain. 07/05/2012  . Pulmonary edema 05/04/2012  . Acute respiratory failure ([place]) 05/04/2012  . CKD (chronic kidney disease) stage 5, GFR less than 15 ml/min (H[facility]) 05/04/2012  .  Hyperkalemia 05/04/2012  . Pre-operative cardiovascular examination 03/15/2012  . Iron  deficiency anemia 09/22/2010  . HTN (hypertension) 09/22/2010  . Hyperlipidemia 09/22/2010  . Gout 09/22/2010  . CRI (chronic renal insufficiency) 09/22/2010  . Pulmonary embolism, bilateral ([place]) 09/22/2010    Yomayra Tate PT, DPT 02/12/2015, 6:09 AM  [facility] [address] [place], [place], [address] Phone: [phone]   Fax:  [phone]  Name: Steven Logan MRN: [id number] Date of Birth: Jul 27, 1970

## 2015-02-18 ENCOUNTER — Ambulatory Visit: Payer: BLUE CROSS/BLUE SHIELD | Admitting: Physical Therapy

## 2015-02-19 ENCOUNTER — Ambulatory Visit: Payer: BLUE CROSS/BLUE SHIELD | Admitting: Physical Therapy

## 2015-02-20 ENCOUNTER — Ambulatory Visit: Payer: BLUE CROSS/BLUE SHIELD | Admitting: Physical Therapy

## 2015-02-20 ENCOUNTER — Encounter: Payer: Self-pay | Admitting: Physical Therapy

## 2015-02-20 DIAGNOSIS — Z4789 Encounter for other orthopedic aftercare: Secondary | ICD-10-CM

## 2015-02-20 DIAGNOSIS — R2681 Unsteadiness on feet: Secondary | ICD-10-CM

## 2015-02-20 DIAGNOSIS — R2689 Other abnormalities of gait and mobility: Secondary | ICD-10-CM

## 2015-02-20 DIAGNOSIS — Z7409 Other reduced mobility: Secondary | ICD-10-CM

## 2015-02-20 DIAGNOSIS — Z89611 Acquired absence of right leg above knee: Secondary | ICD-10-CM

## 2015-02-20 DIAGNOSIS — R269 Unspecified abnormalities of gait and mobility: Secondary | ICD-10-CM

## 2015-02-20 NOTE — Therapy (Signed)
[facility] [address] [place], [place], [address] Phone: [phone]   Fax:  [phone]  Physical Therapy Treatment  Patient Details  Name: CALIXTO Logan MRN: [id number] Date of Birth: [birth year] Referring Provider: Rachell Cipro, MD  Encounter Date: 02/20/2015      PT End of Session - 02/20/15 0845    Visit Number 5   Number of Visits 9   Date for PT Re-Evaluation 03/20/15   Authorization Type BCBS with initial authorization for 9 weeks of care. Patient has missed appts due to health. PT reset target date to 03/20/2015   PT Start Time 0800   PT Stop Time 0844   PT Time Calculation (min) 44 min   Equipment Utilized During Treatment Gait belt   Activity Tolerance Patient tolerated treatment well   Behavior During Therapy [facility] for tasks assessed/performed      Past Medical History  Diagnosis Date  . Eczema   . Morbid obesity ([place])   . Gout   . HTN (hypertension)   . Dyslipidemia   . Pulmonary embolism ([place])     3  in  lungs  at  one time...  . Abscess     great toe  . Pancreatitis   . Arthritis   . Depression   . Diabetes mellitus   . Anemia   . Pneumonia     2-3 years ago  . Shortness of breath     ?? chest  cold he has now.  10/8- no SOB  . Renal hypertension     Hemo started  Sept 2014- MWF  . Chronic kidney disease     esrd    Past Surgical History  Procedure Laterality Date  . None    . Av fistula placement Left 04/07/2012    Procedure: ARTERIOVENOUS (AV) FISTULA CREATION;  Surgeon: Angelia Mould, MD;  Location: [facility];  Service: Vascular;  Laterality: Left;  . Ligation of competing branches of arteriovenous fistula Left 09/19/2012    Procedure: LIGATION OF COMPETING BRANCHES OF LEFT ARM ARTERIOVENOUS FISTULA; Vein angioplasty;  Surgeon: Angelia Mould, MD;  Location: [facility];  Service: Vascular;  Laterality: Left;  . Insertion of dialysis catheter Right 09/19/2012     Procedure: INSERTION OF DIALYSIS CATHETER;  Surgeon: Angelia Mould, MD;  Location: [facility];  Service: Vascular;  Laterality: Right;  . I&d extremity Right 06/02/2013    Procedure: IRRIGATION AND DEBRIDEMENT ARTHROSCOPIC RIGHT ANKLE;  Surgeon: Augustin Schooling, MD;  Location: [facility];  Service: Orthopedics;  Laterality: Right;  . I&d extremity Right 06/04/2013    Procedure: IRRIGATION AND DEBRIDEMENT RIGHT FOOT/ANKLE;  Surgeon: Newt Minion, MD;  Location: [facility];  Service: Orthopedics;  Laterality: Right;  . I&d extremity Right 06/06/2013    Procedure: IRRIGATION AND DEBRIDEMENT EXTREMITY;  Surgeon: Newt Minion, MD;  Location: [facility];  Service: Orthopedics;  Laterality: Right;  . Amputation Right 06/12/2013    Procedure: Transtibial Amputation;  Surgeon: Newt Minion, MD;  Location: [facility];  Service: Orthopedics;  Laterality: Right;  . I&d extremity Right 06/24/2013    Procedure: IRRIGATION AND DEBRIDEMENT and Revsion of TRANSTIBIAL AMPUTATION;  Surgeon: Newt Minion, MD;  Location: [facility];  Service: Orthopedics;  Laterality: Right;  . Amputation Right 06/27/2013    Procedure: AMPUTATION ABOVE KNEE;  Surgeon: Newt Minion, MD;  Location: [facility];  Service: Orthopedics;  Laterality: Right;  Right Above Knee Amputation  . Fistulogram Left 08/07/2012  Procedure: FISTULOGRAM;  Surgeon: Angelia Mould, MD;  Location: [facility] CATH LAB;  Service: Cardiovascular;  Laterality: Left;  arm    There were no vitals filed for this visit.  Visit Diagnosis:  Abnormality of gait  Impaired functional mobility and activity tolerance  Balance problems  Status post above knee amputation of right lower extremity ([place])  Unsteadiness  Encounter for prosthetic gait training      Subjective Assessment - 02/20/15 0800    Subjective Dialysis changed his dry weight and he is having problems near end of session.    Currently in Pain? No/denies                         [facility] Adult PT  Treatment/Exercise - 02/20/15 0800    Ambulation/Gait   Ambulation/Gait Yes   Ambulation/Gait Assistance 5: Supervision   Ambulation/Gait Assistance Details Visual, verbal & tactile cues on proper step width & proper prosthesis advancement with knee flexion in terminal stance using pelvis to control movement.    Ambulation Distance (Feet) 300 Feet  300' X 2 and multi reps in parallel bars for pre-gait   Assistive device Prosthesis;None   Gait Pattern Step-through pattern;Decreased step length - left;Decreased stance time - right;Decreased stride length;Decreased hip/knee flexion - right;Decreased weight shift to right;Right hip hike;Right circumduction;Abducted- right   Ambulation Surface Indoor;Level   Prosthetics   Prosthetic Care Comments  see pt education re prosthesis & dialysis                PT Education - 02/20/15 0845    Education provided Yes   Education Details Dry weight & prosthesis with concerns that with change to dry weight that the prosthesis may be subtracted from his weigh-in & weigh-out weights which would take too much fluid off and cause symptoms he is describing. Patient to check with dialysis nurse.    Person(s) Educated Patient   Methods Explanation;Verbal cues   Comprehension Verbalized understanding          PT Short Term Goals - 01/28/15 1015    PT SHORT TERM GOAL #1   Title Patient descends stairs with 2 rails using prosthesis to lower weight with step-to pattern with cues only. (Target Date: 01/31/2015)   Baseline MET 01/28/2015   Time 4   Period Weeks   Status Achieved   PT SHORT TERM GOAL #2   Title Patient ambulates with head turns to scan environment maintaining path & pace with cues.  (Target Date: 01/31/2015)   Baseline MET 01/28/2015   Time 4   Period Weeks   Status Achieved   PT SHORT TERM GOAL #3   Title Patient ambulates 300' with prosthesis only with cues for deviations.  (Target Date: 01/31/2015)   Baseline MET 01/28/2015   Time 4    Period Weeks   Status Achieved   PT SHORT TERM GOAL #4   Title Patient demonstrates updated HEP to facilitate stability for balance activities and decrease gait deviations.  (Target Date: 01/31/2015)   Baseline MET 01/28/2015   Time 4   Period Weeks   Status Achieved           PT Long Term Goals - 02/20/15 0845    PT LONG TERM GOAL #1   Title (p) Patient ambulates 500' with prosthesis without device in less 3 minutes independently.  (Target Date: 03/20/2015)   Time (p) 8   Period (p) Weeks   Status (p) On-going   PT  LONG TERM GOAL #2   Title (p) Patient negotiates ramps & curbs with prosthesis only and descend stairs with 1 rail modified independent.   (Target Date: 03/20/2015)   Time (p) 8   Period (p) Weeks   Status (p) On-going   PT LONG TERM GOAL #3   Title (p) Berg Balance >52/56 to decrease fall risk  (Target Date: 03/20/2015)   Time (p) 8   Period (p) Weeks   Status (p) On-going   PT LONG TERM GOAL #4   Title (p) Functional Gait Assessment >20/30 to decrease fall risk.  (Target Date: 03/20/2015)   Time (p) 8   Period (p) Weeks   Status (p) On-going               Plan - 02/20/15 0845    Clinical Impression Statement Patient improved knee control with pre-gait activities in parallel bars with focus on step width and terminal stance knee flexion. Patient appears to understand how prosthesis weight being included in dry weight could throw off amount of fluid to pull off in dialsysis.    Pt will benefit from skilled therapeutic intervention in order to improve on the following deficits Abnormal gait;Decreased activity tolerance;Decreased balance;Decreased endurance;Decreased mobility   Rehab Potential Good   PT Frequency 1x / week   PT Duration 8 weeks   PT Treatment/Interventions ADLs/Self Care Home Management;Gait training;Stair training;Functional mobility training;Therapeutic activities;Therapeutic exercise;Balance training;Neuromuscular re-education;Patient/family  education;Prosthetic Training   PT Next Visit Plan stairs technique using prosthesis to descend using C-leg; work on ramps & curbs; standing balance HEP, work towards [employer] and Agree with Plan of Care Patient        Problem List Patient Active Problem List   Diagnosis Date Noted  . Acute blood loss anemia 06/25/2013  . Hyperglycemia 06/07/2013  . Arm DVT (deep venous thromboembolism), acute ([place]) 06/07/2013  . ESRD on dialysis ([place]) 06/05/2013  . Encephalopathy, toxic 06/03/2013  . Cellulitis and abscess of leg 06/03/2013  . Anemia in chronic kidney disease 06/03/2013  . Arm edema 06/03/2013  . Leg abscess 06/03/2013  . Severe sepsis ([place]) 06/03/2013  . Septic shock ([place]) 06/03/2013  . Septic arthritis of ankle or foot, right 06/03/2013  . Abscess of right lower leg 06/02/2013  . Wrist lump 07/05/2012  . Swelling of limb-Left index and middle fingers 07/05/2012  . Aftercare following surgery of the circulatory system, [place] 07/05/2012  . Tingling-left wrist and some pain. 07/05/2012  . Pulmonary edema 05/04/2012  . Acute respiratory failure ([place]) 05/04/2012  . CKD (chronic kidney disease) stage 5, GFR less than 15 ml/min ([facility]) 05/04/2012  . Hyperkalemia 05/04/2012  . Pre-operative cardiovascular examination 03/15/2012  . Iron deficiency anemia 09/22/2010  . HTN (hypertension) 09/22/2010  . Hyperlipidemia 09/22/2010  . Gout 09/22/2010  . CRI (chronic renal insufficiency) 09/22/2010  . Pulmonary embolism, bilateral ([place]) 09/22/2010    Kenli Waldo PT, DPT 02/20/2015, 1:09 PM  [facility] [address] [place], [place], [address] Phone: [phone]   Fax:  [phone]  Name: SAVOY S[place]ERVILLE MRN: [id number] Date of Birth: 04-28-1970

## 2015-02-25 ENCOUNTER — Encounter: Payer: BLUE CROSS/BLUE SHIELD | Admitting: Physical Therapy

## 2015-02-27 ENCOUNTER — Encounter: Payer: Self-pay | Admitting: Physical Therapy

## 2015-02-27 ENCOUNTER — Ambulatory Visit: Payer: BLUE CROSS/BLUE SHIELD | Attending: Family Medicine | Admitting: Physical Therapy

## 2015-02-27 DIAGNOSIS — R269 Unspecified abnormalities of gait and mobility: Secondary | ICD-10-CM

## 2015-02-27 DIAGNOSIS — Z89611 Acquired absence of right leg above knee: Secondary | ICD-10-CM | POA: Diagnosis present

## 2015-02-27 DIAGNOSIS — R6889 Other general symptoms and signs: Secondary | ICD-10-CM | POA: Diagnosis present

## 2015-02-27 DIAGNOSIS — R29818 Other symptoms and signs involving the nervous system: Secondary | ICD-10-CM | POA: Insufficient documentation

## 2015-02-27 DIAGNOSIS — Z5189 Encounter for other specified aftercare: Secondary | ICD-10-CM | POA: Insufficient documentation

## 2015-02-27 DIAGNOSIS — Z7409 Other reduced mobility: Secondary | ICD-10-CM | POA: Insufficient documentation

## 2015-02-27 DIAGNOSIS — R2681 Unsteadiness on feet: Secondary | ICD-10-CM | POA: Insufficient documentation

## 2015-02-27 DIAGNOSIS — R2689 Other abnormalities of gait and mobility: Secondary | ICD-10-CM

## 2015-02-27 DIAGNOSIS — Z4789 Encounter for other orthopedic aftercare: Secondary | ICD-10-CM

## 2015-02-27 NOTE — Therapy (Signed)
[facility] [address] [place], [place], [address] Phone: [phone]   Fax:  [phone]  Physical Therapy Treatment  Patient Details  Name: Steven Logan MRN: [id number] Date of Birth: 1970-11-13 Referring Provider: Rachell Cipro, MD  Encounter Date: 02/27/2015      PT End of Session - 02/27/15 0930    Visit Number 6   Number of Visits 9   Date for PT Re-Evaluation 03/20/15   Authorization Type BCBS with initial authorization for 9 weeks of care. Patient has missed appts due to health. PT reset target date to 03/20/2015   PT Start Time 0845   PT Stop Time 0930   PT Time Calculation (min) 45 min   Equipment Utilized During Treatment Gait belt   Activity Tolerance Patient tolerated treatment well   Behavior During Therapy [facility] for tasks assessed/performed      Past Medical History  Diagnosis Date  . Eczema   . Morbid obesity ([place])   . Gout   . HTN (hypertension)   . Dyslipidemia   . Pulmonary embolism ([place])     3  in  lungs  at  one time...  . Abscess     great toe  . Pancreatitis   . Arthritis   . Depression   . Diabetes mellitus   . Anemia   . Pneumonia     2-3 years ago  . Shortness of breath     ?? chest  cold he has now.  10/8- no SOB  . Renal hypertension     Hemo started  Sept 2014- MWF  . Chronic kidney disease     esrd    Past Surgical History  Procedure Laterality Date  . None    . Av fistula placement Left 04/07/2012    Procedure: ARTERIOVENOUS (AV) FISTULA CREATION;  Surgeon: Angelia Mould, MD;  Location: [facility];  Service: Vascular;  Laterality: Left;  . Ligation of competing branches of arteriovenous fistula Left 09/19/2012    Procedure: LIGATION OF COMPETING BRANCHES OF LEFT ARM ARTERIOVENOUS FISTULA; Vein angioplasty;  Surgeon: Angelia Mould, MD;  Location: [facility];  Service: Vascular;  Laterality: Left;  . Insertion of dialysis catheter Right 09/19/2012     Procedure: INSERTION OF DIALYSIS CATHETER;  Surgeon: Angelia Mould, MD;  Location: [facility];  Service: Vascular;  Laterality: Right;  . I&d extremity Right 06/02/2013    Procedure: IRRIGATION AND DEBRIDEMENT ARTHROSCOPIC RIGHT ANKLE;  Surgeon: Augustin Schooling, MD;  Location: [facility];  Service: Orthopedics;  Laterality: Right;  . I&d extremity Right 06/04/2013    Procedure: IRRIGATION AND DEBRIDEMENT RIGHT FOOT/ANKLE;  Surgeon: Newt Minion, MD;  Location: [place];  Service: Orthopedics;  Laterality: Right;  . I&d extremity Right 06/06/2013    Procedure: IRRIGATION AND DEBRIDEMENT EXTREMITY;  Surgeon: Newt Minion, MD;  Location: [facility];  Service: Orthopedics;  Laterality: Right;  . Amputation Right 06/12/2013    Procedure: Transtibial Amputation;  Surgeon: Newt Minion, MD;  Location: [facility];  Service: Orthopedics;  Laterality: Right;  . I&d extremity Right 06/24/2013    Procedure: IRRIGATION AND DEBRIDEMENT and Revsion of TRANSTIBIAL AMPUTATION;  Surgeon: Newt Minion, MD;  Location: [facility];  Service: Orthopedics;  Laterality: Right;  . Amputation Right 06/27/2013    Procedure: AMPUTATION ABOVE KNEE;  Surgeon: Newt Minion, MD;  Location: [place];  Service: Orthopedics;  Laterality: Right;  Right Above Knee Amputation  . Fistulogram Left 08/07/2012  Procedure: FISTULOGRAM;  Surgeon: Angelia Mould, MD;  Location: [facility] CATH LAB;  Service: Cardiovascular;  Laterality: Left;  arm    There were no vitals filed for this visit.  Visit Diagnosis:  Abnormality of gait  Impaired functional mobility and activity tolerance  Balance problems  Status post above knee amputation of right lower extremity ([place])  Encounter for prosthetic gait training  Unsteadiness      Subjective Assessment - 02/27/15 0856    Subjective (p) No news on his knee unit so using loaner still. No falls.    Currently in Pain? (p) No/denies     Prosthetic Training with Transfemoral C-leg (microprocessor)  prosthesis: Patient arrived with prosthesis internally rotated. PT had patient palpate posterior window in sitting for reference that socket was rotated on his limb. Patient re-donned prosthesis with PT cueing proper foot position (flat foot with slight external rotation with feet even distance) with pelvis neutral rotation as he seats his limb into socket. Pt verbalized and return demo understanding. With PT direction, pt palpated posterior window in sitting for reference to proper donning. Worked on standing at counter then turning 90* (similar to work activity) using pelvis to initiate turning prosthesis with weight shift forward in direction of movement (no back stepping causing weight to teeter anterior /posterior). Progressed activity to turning and walking that direction along counter for safety but without UE assist. Worked on using technique for prosthesis control in sit to/from stand with lifting. Patient able to lift 15# box from upper then lower handles with visual, verbal & tactile cues. Progressed to lift, turning 90* both to right & left walking 15', lowering box softly to floor with SBA / cues.                             PT Short Term Goals - 01/28/15 1015    PT SHORT TERM GOAL #1   Title Patient descends stairs with 2 rails using prosthesis to lower weight with step-to pattern with cues only. (Target Date: 01/31/2015)   Baseline MET 01/28/2015   Time 4   Period Weeks   Status Achieved   PT SHORT TERM GOAL #2   Title Patient ambulates with head turns to scan environment maintaining path & pace with cues.  (Target Date: 01/31/2015)   Baseline MET 01/28/2015   Time 4   Period Weeks   Status Achieved   PT SHORT TERM GOAL #3   Title Patient ambulates 300' with prosthesis only with cues for deviations.  (Target Date: 01/31/2015)   Baseline MET 01/28/2015   Time 4   Period Weeks   Status Achieved   PT SHORT TERM GOAL #4   Title Patient demonstrates updated HEP to  facilitate stability for balance activities and decrease gait deviations.  (Target Date: 01/31/2015)   Baseline MET 01/28/2015   Time 4   Period Weeks   Status Achieved           PT Long Term Goals - 02/20/15 0845    PT LONG TERM GOAL #1   Title (p) Patient ambulates 500' with prosthesis without device in less 3 minutes independently.  (Target Date: 03/20/2015)   Time (p) 8   Period (p) Weeks   Status (p) On-going   PT LONG TERM GOAL #2   Title (p) Patient negotiates ramps & curbs with prosthesis only and descend stairs with 1 rail modified independent.   (Target Date: 03/20/2015)   Time (  p) 8   Period (p) Weeks   Status (p) On-going   PT LONG TERM GOAL #3   Title (p) Berg Balance >52/56 to decrease fall risk  (Target Date: 03/20/2015)   Time (p) 8   Period (p) Weeks   Status (p) On-going   PT LONG TERM GOAL #4   Title (p) Functional Gait Assessment >20/30 to decrease fall risk.  (Target Date: 03/20/2015)   Time (p) 8   Period (p) Weeks   Status (p) On-going               Plan - 02/27/15 0930    Clinical Impression Statement Patient improved ability to change directions 90* turns with transition of wt correctly towards direction going. Patient improved ablility to lift & lower 15# box to /from floor. Patient has better concept of donning prosthesis without internal rotation.    Pt will benefit from skilled therapeutic intervention in order to improve on the following deficits Abnormal gait;Decreased activity tolerance;Decreased balance;Decreased endurance;Decreased mobility   Rehab Potential Good   PT Frequency 1x / week   PT Duration 8 weeks   PT Treatment/Interventions ADLs/Self Care Home Management;Gait training;Stair training;Functional mobility training;Therapeutic activities;Therapeutic exercise;Balance training;Neuromuscular re-education;Patient/family education;Prosthetic Training   PT Next Visit Plan stairs technique using prosthesis to descend using C-leg; work on  ramps & curbs; standing balance HEP, work towards [employer] and Agree with Plan of Care Patient        Problem List Patient Active Problem List   Diagnosis Date Noted  . Acute blood loss anemia 06/25/2013  . Hyperglycemia 06/07/2013  . Arm DVT (deep venous thromboembolism), acute ([place]) 06/07/2013  . ESRD on dialysis ([place]) 06/05/2013  . Encephalopathy, toxic 06/03/2013  . Cellulitis and abscess of leg 06/03/2013  . Anemia in chronic kidney disease 06/03/2013  . Arm edema 06/03/2013  . Leg abscess 06/03/2013  . Severe sepsis ([place]) 06/03/2013  . Septic shock ([place]) 06/03/2013  . Septic arthritis of ankle or foot, right 06/03/2013  . Abscess of right lower leg 06/02/2013  . Wrist lump 07/05/2012  . Swelling of limb-Left index and middle fingers 07/05/2012  . Aftercare following surgery of the circulatory system, [place] 07/05/2012  . Tingling-left wrist and some pain. 07/05/2012  . Pulmonary edema 05/04/2012  . Acute respiratory failure ([place]) 05/04/2012  . CKD (chronic kidney disease) stage 5, GFR less than 15 ml/min (HCC) 05/04/2012  . Hyperkalemia 05/04/2012  . Pre-operative cardiovascular examination 03/15/2012  . Iron deficiency anemia 09/22/2010  . HTN (hypertension) 09/22/2010  . Hyperlipidemia 09/22/2010  . Gout 09/22/2010  . CRI (chronic renal insufficiency) 09/22/2010  . Pulmonary embolism, bilateral ([place]) 09/22/2010    Jelani Vreeland PT, DPT 02/27/2015, 2:50 PM  [facility] [address] [place], [place], [address] Phone: [phone]   Fax:  [phone]  Name: Steven Logan MRN: [id number] Date of Birth: 01-Feb-1970

## 2015-03-04 ENCOUNTER — Ambulatory Visit: Payer: BLUE CROSS/BLUE SHIELD | Admitting: Physical Therapy

## 2015-03-11 ENCOUNTER — Encounter: Payer: Self-pay | Admitting: Physical Therapy

## 2015-03-11 ENCOUNTER — Ambulatory Visit: Payer: BLUE CROSS/BLUE SHIELD | Admitting: Physical Therapy

## 2015-03-11 DIAGNOSIS — R2689 Other abnormalities of gait and mobility: Secondary | ICD-10-CM

## 2015-03-11 DIAGNOSIS — Z7409 Other reduced mobility: Secondary | ICD-10-CM

## 2015-03-11 DIAGNOSIS — R2681 Unsteadiness on feet: Secondary | ICD-10-CM

## 2015-03-11 DIAGNOSIS — R269 Unspecified abnormalities of gait and mobility: Secondary | ICD-10-CM

## 2015-03-11 DIAGNOSIS — Z4789 Encounter for other orthopedic aftercare: Secondary | ICD-10-CM

## 2015-03-11 DIAGNOSIS — Z89611 Acquired absence of right leg above knee: Secondary | ICD-10-CM

## 2015-03-11 NOTE — Therapy (Signed)
[facility] [address] [place], [place], [address] Phone: [phone]   Fax:  [phone]  Physical Therapy Treatment  Patient Details  Name: Steven Logan MRN: [id number] Date of Birth: October 15, 1970 Referring Provider: Rachell Cipro, MD  Encounter Date: 03/11/2015      PT End of Session - 03/11/15 0845    Visit Number 7   Number of Visits 9   Date for PT Re-Evaluation 03/25/15   Authorization Type BCBS with initial authorization for 9 weeks of care. Patient has missed appts due to health. PT reset target date to 03/25/2015   PT Start Time 0802   PT Stop Time 0845   PT Time Calculation (min) 43 min   Equipment Utilized During Treatment Gait belt   Activity Tolerance Patient tolerated treatment well   Behavior During Therapy [facility] for tasks assessed/performed      Past Medical History  Diagnosis Date  . Eczema   . Morbid obesity ([place])   . Gout   . HTN (hypertension)   . Dyslipidemia   . Pulmonary embolism ([place])     3  in  lungs  at  one time...  . Abscess     great toe  . Pancreatitis   . Arthritis   . Depression   . Diabetes mellitus   . Anemia   . Pneumonia     2-3 years ago  . Shortness of breath     ?? chest  cold he has now.  10/8- no SOB  . Renal hypertension     Hemo started  Sept 2014- MWF  . Chronic kidney disease     esrd    Past Surgical History  Procedure Laterality Date  . None    . Av fistula placement Left 04/07/2012    Procedure: ARTERIOVENOUS (AV) FISTULA CREATION;  Surgeon: Angelia Mould, MD;  Location: [place];  Service: Vascular;  Laterality: Left;  . Ligation of competing branches of arteriovenous fistula Left 09/19/2012    Procedure: LIGATION OF COMPETING BRANCHES OF LEFT ARM ARTERIOVENOUS FISTULA; Vein angioplasty;  Surgeon: Angelia Mould, MD;  Location: [place];  Service: Vascular;  Laterality: Left;  . Insertion of dialysis catheter Right 09/19/2012   Procedure: INSERTION OF DIALYSIS CATHETER;  Surgeon: Angelia Mould, MD;  Location: [facility];  Service: Vascular;  Laterality: Right;  . I&d extremity Right 06/02/2013    Procedure: IRRIGATION AND DEBRIDEMENT ARTHROSCOPIC RIGHT ANKLE;  Surgeon: Augustin Schooling, MD;  Location: [facility];  Service: Orthopedics;  Laterality: Right;  . I&d extremity Right 06/04/2013    Procedure: IRRIGATION AND DEBRIDEMENT RIGHT FOOT/ANKLE;  Surgeon: Newt Minion, MD;  Location: [place];  Service: Orthopedics;  Laterality: Right;  . I&d extremity Right 06/06/2013    Procedure: IRRIGATION AND DEBRIDEMENT EXTREMITY;  Surgeon: Newt Minion, MD;  Location: [place];  Service: Orthopedics;  Laterality: Right;  . Amputation Right 06/12/2013    Procedure: Transtibial Amputation;  Surgeon: Newt Minion, MD;  Location: [place];  Service: Orthopedics;  Laterality: Right;  . I&d extremity Right 06/24/2013    Procedure: IRRIGATION AND DEBRIDEMENT and Revsion of TRANSTIBIAL AMPUTATION;  Surgeon: Newt Minion, MD;  Location: [place] [place];  Service: Orthopedics;  Laterality: Right;  . Amputation Right 06/27/2013    Procedure: AMPUTATION ABOVE KNEE;  Surgeon: Newt Minion, MD;  Location: [place];  Service: Orthopedics;  Laterality: Right;  Right Above Knee Amputation  . Fistulogram Left 08/07/2012  Procedure: FISTULOGRAM;  Surgeon: Angelia Mould, MD;  Location: [facility] CATH LAB;  Service: Cardiovascular;  Laterality: Left;  arm    There were no vitals filed for this visit.  Visit Diagnosis:  Abnormality of gait  Impaired functional mobility and activity tolerance  Balance problems  Status post above knee amputation of right lower extremity ([place])  Encounter for prosthetic gait training  Unsteadiness      Subjective Assessment - 03/11/15 0811    Subjective He is getting his knee back today. Prosthesis is rotating at work and he has rub mark at adductor prosthesis.    Currently in Pain? 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 PT demo, reviewed proper motion of lower extremities to decreased torsion. Patient able to return demonstration with verbal correctional cues from PT.  Gait using visual line to decrease abduction of prosthesis.  Stairs: initially step-to riding hydraulics with minimal cues. PT demo, instructed reciprocal pattern and pt return demo with 2 rails with initial tactile, verbal cues. He progressed to minimal verbal cues. PT advised to only practice from ~5 steps up not a full flight for safety. Pt verbalized understanding.  Ramp/ incline: Treadmill downhill 5*, 7* & 9* at 1.0 mph with visual, tactile & verbal cues on technique. Patient carried over over ground with verbal cues.                               PT Short Term Goals - 01/28/15 1015    PT SHORT TERM GOAL #1   Title Patient descends stairs with 2 rails using prosthesis to lower weight with step-to pattern with cues only. (Target Date: 01/31/2015)   Baseline MET 01/28/2015   Time 4   Period Weeks   Status Achieved   PT SHORT TERM GOAL #2   Title Patient ambulates with head turns to scan environment maintaining path & pace with cues.  (Target Date: 01/31/2015)   Baseline MET 01/28/2015   Time 4   Period Weeks   Status Achieved   PT SHORT TERM GOAL #3   Title Patient ambulates 300' with prosthesis only with cues for deviations.  (Target Date: 01/31/2015)   Baseline MET 01/28/2015   Time 4   Period Weeks   Status Achieved   PT SHORT TERM GOAL #4   Title Patient demonstrates updated HEP to facilitate stability for balance activities and decrease gait deviations.  (Target Date: 01/31/2015)   Baseline MET 01/28/2015   Time 4   Period Weeks   Status Achieved           PT Long Term Goals - 03/11/15 0845    PT LONG TERM GOAL #1   Title  Patient ambulates 500' with prosthesis without device in less 3 minutes independently.  (Target Date: 03/25/2015)   Time 8   Period Weeks   Status On-going   PT LONG TERM GOAL #2   Title Patient negotiates ramps & curbs with prosthesis only and descend stairs with 1 rail modified independent.   (Target Date: 03/25/2015)   Time 8   Period Weeks   Status On-going   PT LONG TERM GOAL #3   Title Berg Balance >52/56 to decrease fall risk  (Target Date: 03/25/2015)   Time 8   Period  Weeks   Status On-going   PT LONG TERM GOAL #4   Title Functional Gait Assessment >20/30 to decrease fall risk.  (Target Date: 03/25/2015)   Time 8   Period Weeks   Status On-going               Plan - 03/11/15 0845    Clinical Impression Statement Patient improved his ability to descend stairs reciprocally and descend inclines with use of hydraulics on prosthesis. Patient may benefit from a rotator in pylon that would aid with ability to rotate upper body on planted / set feet like his work activities require.    Pt will benefit from skilled therapeutic intervention in order to improve on the following deficits Abnormal gait;Decreased activity tolerance;Decreased balance;Decreased endurance;Decreased mobility   Rehab Potential Good   PT Frequency 1x / week   PT Duration 8 weeks   PT Treatment/Interventions ADLs/Self Care Home Management;Gait training;Stair training;Functional mobility training;Therapeutic activities;Therapeutic exercise;Balance training;Neuromuscular re-education;Patient/family education;Prosthetic Training   PT Next Visit Plan stairs technique using prosthesis to descend using C-leg; work on ramps & curbs; standing balance HEP, work towards [employer] and Agree with Plan of Care Patient        Problem List Patient Active Problem List   Diagnosis Date Noted  . Acute blood loss anemia 06/25/2013  . Hyperglycemia 06/07/2013  . Arm DVT (deep venous thromboembolism), acute ([place])  06/07/2013  . ESRD on dialysis ([place]) 06/05/2013  . Encephalopathy, toxic 06/03/2013  . Cellulitis and abscess of leg 06/03/2013  . Anemia in chronic kidney disease 06/03/2013  . Arm edema 06/03/2013  . Leg abscess 06/03/2013  . Severe sepsis ([place]) 06/03/2013  . Septic shock ([place]) 06/03/2013  . Septic arthritis of ankle or foot, right 06/03/2013  . Abscess of right lower leg 06/02/2013  . Wrist lump 07/05/2012  . Swelling of limb-Left index and middle fingers 07/05/2012  . Aftercare following surgery of the circulatory system, [place] 07/05/2012  . Tingling-left wrist and some pain. 07/05/2012  . Pulmonary edema 05/04/2012  . Acute respiratory failure ([place]) 05/04/2012  . CKD (chronic kidney disease) stage 5, GFR less than 15 ml/min (HCC) 05/04/2012  . Hyperkalemia 05/04/2012  . Pre-operative cardiovascular examination 03/15/2012  . Iron deficiency anemia 09/22/2010  . HTN (hypertension) 09/22/2010  . Hyperlipidemia 09/22/2010  . Gout 09/22/2010  . CRI (chronic renal insufficiency) 09/22/2010  . Pulmonary embolism, bilateral ([place]) 09/22/2010    Mykel Sponaugle PT, DPT 03/11/2015, 11:27 AM  [facility] [address] [place], [place], [address] Phone: [phone]   Fax:  [phone]  Name: Steven Logan MRN: [id number] Date of Birth: August 07, 1970

## 2015-03-18 ENCOUNTER — Encounter: Payer: Self-pay | Admitting: Physical Therapy

## 2015-03-18 ENCOUNTER — Ambulatory Visit: Payer: BLUE CROSS/BLUE SHIELD | Admitting: Physical Therapy

## 2015-03-18 DIAGNOSIS — R269 Unspecified abnormalities of gait and mobility: Secondary | ICD-10-CM | POA: Diagnosis not present

## 2015-03-18 DIAGNOSIS — R2681 Unsteadiness on feet: Secondary | ICD-10-CM

## 2015-03-18 DIAGNOSIS — R6889 Other general symptoms and signs: Secondary | ICD-10-CM

## 2015-03-18 DIAGNOSIS — Z89611 Acquired absence of right leg above knee: Secondary | ICD-10-CM

## 2015-03-18 DIAGNOSIS — R2689 Other abnormalities of gait and mobility: Secondary | ICD-10-CM

## 2015-03-18 DIAGNOSIS — Z4789 Encounter for other orthopedic aftercare: Secondary | ICD-10-CM

## 2015-03-18 DIAGNOSIS — Z7409 Other reduced mobility: Secondary | ICD-10-CM

## 2015-03-18 NOTE — Therapy (Signed)
[facility] [address] [place], [place], [address] Phone: [phone]   Fax:  [phone]  Physical Therapy Treatment  Patient Details  Name: Steven Logan MRN: [id number] Date of Birth: 02-20-1970 Referring Provider: Rachell Cipro, MD  Encounter Date: 03/18/2015      PT End of Session - 03/18/15 0930    Visit Number 8   Number of Visits 9   Date for PT Re-Evaluation 03/25/15   Authorization Type BCBS with initial authorization for 9 weeks of care. Patient has missed appts due to health. PT reset target date to 03/25/2015   PT Start Time 0847   PT Stop Time 0930   PT Time Calculation (min) 43 min   Equipment Utilized During Treatment Gait belt   Activity Tolerance Patient tolerated treatment well   Behavior During Therapy [facility] for tasks assessed/performed      Past Medical History  Diagnosis Date  . Eczema   . Morbid obesity ([place])   . Gout   . HTN (hypertension)   . Dyslipidemia   . Pulmonary embolism ([place])     3  in  lungs  at  one time...  . Abscess     great toe  . Pancreatitis   . Arthritis   . Depression   . Diabetes mellitus   . Anemia   . Pneumonia     2-3 years ago  . Shortness of breath     ?? chest  cold he has now.  10/8- no SOB  . Renal hypertension     Hemo started  Sept 2014- MWF  . Chronic kidney disease     esrd    Past Surgical History  Procedure Laterality Date  . None    . Av fistula placement Left 04/07/2012    Procedure: ARTERIOVENOUS (AV) FISTULA CREATION;  Surgeon: Angelia Mould, MD;  Location: [place];  Service: Vascular;  Laterality: Left;  . Ligation of competing branches of arteriovenous fistula Left 09/19/2012    Procedure: LIGATION OF COMPETING BRANCHES OF LEFT ARM ARTERIOVENOUS FISTULA; Vein angioplasty;  Surgeon: Angelia Mould, MD;  Location: [place];  Service: Vascular;  Laterality: Left;  . Insertion of dialysis catheter Right 09/19/2012   Procedure: INSERTION OF DIALYSIS CATHETER;  Surgeon: Angelia Mould, MD;  Location: [place];  Service: Vascular;  Laterality: Right;  . I&d extremity Right 06/02/2013    Procedure: IRRIGATION AND DEBRIDEMENT ARTHROSCOPIC RIGHT ANKLE;  Surgeon: Augustin Schooling, MD;  Location: [place];  Service: Orthopedics;  Laterality: Right;  . I&d extremity Right 06/04/2013    Procedure: IRRIGATION AND DEBRIDEMENT RIGHT FOOT/ANKLE;  Surgeon: Newt Minion, MD;  Location: [place];  Service: Orthopedics;  Laterality: Right;  . I&d extremity Right 06/06/2013    Procedure: IRRIGATION AND DEBRIDEMENT EXTREMITY;  Surgeon: Newt Minion, MD;  Location: [place];  Service: Orthopedics;  Laterality: Right;  . Amputation Right 06/12/2013    Procedure: Transtibial Amputation;  Surgeon: Newt Minion, MD;  Location: [place];  Service: Orthopedics;  Laterality: Right;  . I&d extremity Right 06/24/2013    Procedure: IRRIGATION AND DEBRIDEMENT and Revsion of TRANSTIBIAL AMPUTATION;  Surgeon: Newt Minion, MD;  Location: [place];  Service: Orthopedics;  Laterality: Right;  . Amputation Right 06/27/2013    Procedure: AMPUTATION ABOVE KNEE;  Surgeon: Newt Minion, MD;  Location: [place];  Service: Orthopedics;  Laterality: Right;  Right Above Knee Amputation  . Fistulogram Left 08/07/2012  Procedure: FISTULOGRAM;  Surgeon: Angelia Mould, MD;  Location: [facility] CATH LAB;  Service: Cardiovascular;  Laterality: Left;  arm    There were no vitals filed for this visit.  Visit Diagnosis:  Abnormality of gait  Impaired functional mobility and activity tolerance  Balance problems  Status post above knee amputation of right lower extremity ([place])  Encounter for prosthetic gait training  Unsteadiness  Activity intolerance      Subjective Assessment - 03/18/15 1120    Subjective He got back his prosthetic knee (C-leg) along with control fob.    Currently in Pain? No/denies     Prosthetic Training with microprocessor C-leg  Transfemoral Amputation prosthesis Stairs initially with 2 rails and progressed to left rail /right UE sliding along wall using hydraulics with step over step pattern with verbal cues on technique multiple reps. Ramp with cues on flexing knee in swing to ascend and wt shift over prosthesis in stance; descend using hydraulics in stance to flex knee. Patient ambulated >1000' outside on grass with work on grass sloped with occasional contact assist for balance. Patient worked on scanning while ambulating without change in speed or path.  Worked on work Science writer of turning to right as if moving plates from prep counter to cart. Increasing speed to "cross a street" with cues on quicker & longer steps.                               PT Short Term Goals - 01/28/15 1015    PT SHORT TERM GOAL #1   Title Patient descends stairs with 2 rails using prosthesis to lower weight with step-to pattern with cues only. (Target Date: 01/31/2015)   Baseline MET 01/28/2015   Time 4   Period Weeks   Status Achieved   PT SHORT TERM GOAL #2   Title Patient ambulates with head turns to scan environment maintaining path & pace with cues.  (Target Date: 01/31/2015)   Baseline MET 01/28/2015   Time 4   Period Weeks   Status Achieved   PT SHORT TERM GOAL #3   Title Patient ambulates 300' with prosthesis only with cues for deviations.  (Target Date: 01/31/2015)   Baseline MET 01/28/2015   Time 4   Period Weeks   Status Achieved   PT SHORT TERM GOAL #4   Title Patient demonstrates updated HEP to facilitate stability for balance activities and decrease gait deviations.  (Target Date: 01/31/2015)   Baseline MET 01/28/2015   Time 4   Period Weeks   Status Achieved           PT Long Term Goals - 03/11/15 0845    PT LONG TERM GOAL #1   Title Patient ambulates 500' with prosthesis without device in less 3 minutes independently.  (Target Date: 03/25/2015)   Time 8   Period Weeks   Status On-going   PT LONG  TERM GOAL #2   Title Patient negotiates ramps & curbs with prosthesis only and descend stairs with 1 rail modified independent.   (Target Date: 03/25/2015)   Time 8   Period Weeks   Status On-going   PT LONG TERM GOAL #3   Title Berg Balance >52/56 to decrease fall risk  (Target Date: 03/25/2015)   Time 8   Period Weeks   Status On-going   PT LONG TERM GOAL #4   Title Functional Gait Assessment >20/30 to decrease fall risk.  (Target Date:  03/25/2015)   Time 8   Period Weeks   Status On-going               Plan - 03/18/15 0930    Clinical Impression Statement patient appears on target to meet LTGs next week. Patient improved knee control on stairs with one rail & second hand sliding along wall. patient improved knee control on grass slopes.    Pt will benefit from skilled therapeutic intervention in order to improve on the following deficits Abnormal gait;Decreased activity tolerance;Decreased balance;Decreased endurance;Decreased mobility   Rehab Potential Good   PT Frequency 1x / week   PT Duration 8 weeks   PT Treatment/Interventions ADLs/Self Care Home Management;Gait training;Stair training;Functional mobility training;Therapeutic activities;Therapeutic exercise;Balance training;Neuromuscular re-education;Patient/family education;Prosthetic Training   PT Next Visit Plan Assess LTGs   Consulted and Agree with Plan of Care Patient        Problem List Patient Active Problem List   Diagnosis Date Noted  . Acute blood loss anemia 06/25/2013  . Hyperglycemia 06/07/2013  . Arm DVT (deep venous thromboembolism), acute ([place]) 06/07/2013  . ESRD on dialysis ([place]) 06/05/2013  . Encephalopathy, toxic 06/03/2013  . Cellulitis and abscess of leg 06/03/2013  . Anemia in chronic kidney disease 06/03/2013  . Arm edema 06/03/2013  . Leg abscess 06/03/2013  . Severe sepsis ([place]) 06/03/2013  . Septic shock ([place]) 06/03/2013  . Septic arthritis of ankle or foot, right 06/03/2013  .  Abscess of right lower leg 06/02/2013  . Wrist lump 07/05/2012  . Swelling of limb-Left index and middle fingers 07/05/2012  . Aftercare following surgery of the circulatory system, [place] 07/05/2012  . Tingling-left wrist and some pain. 07/05/2012  . Pulmonary edema 05/04/2012  . Acute respiratory failure ([place]) 05/04/2012  . CKD (chronic kidney disease) stage 5, GFR less than 15 ml/min (HCC) 05/04/2012  . Hyperkalemia 05/04/2012  . Pre-operative cardiovascular examination 03/15/2012  . Iron deficiency anemia 09/22/2010  . HTN (hypertension) 09/22/2010  . Hyperlipidemia 09/22/2010  . Gout 09/22/2010  . CRI (chronic renal insufficiency) 09/22/2010  . Pulmonary embolism, bilateral ([place]) 09/22/2010    Hughie Melroy PT, DPT 03/18/2015, 11:24 AM  [facility] [address] [place], [place], [address] Phone: [phone]   Fax:  [phone]  Name: EMAURI KRYGIER MRN: [id number] Date of Birth: [birth year]

## 2015-03-25 ENCOUNTER — Ambulatory Visit: Payer: BLUE CROSS/BLUE SHIELD | Admitting: Physical Therapy

## 2015-03-27 ENCOUNTER — Encounter: Payer: Self-pay | Admitting: Physical Therapy

## 2015-03-27 ENCOUNTER — Ambulatory Visit: Payer: BLUE CROSS/BLUE SHIELD | Attending: Family Medicine | Admitting: Physical Therapy

## 2015-03-27 DIAGNOSIS — R269 Unspecified abnormalities of gait and mobility: Secondary | ICD-10-CM | POA: Diagnosis present

## 2015-03-27 DIAGNOSIS — Z7409 Other reduced mobility: Secondary | ICD-10-CM | POA: Diagnosis present

## 2015-03-27 DIAGNOSIS — Z89611 Acquired absence of right leg above knee: Secondary | ICD-10-CM

## 2015-03-27 DIAGNOSIS — Z5189 Encounter for other specified aftercare: Secondary | ICD-10-CM | POA: Insufficient documentation

## 2015-03-27 DIAGNOSIS — R2681 Unsteadiness on feet: Secondary | ICD-10-CM

## 2015-03-27 DIAGNOSIS — R2689 Other abnormalities of gait and mobility: Secondary | ICD-10-CM

## 2015-03-27 DIAGNOSIS — R29818 Other symptoms and signs involving the nervous system: Secondary | ICD-10-CM | POA: Diagnosis present

## 2015-03-27 DIAGNOSIS — Z4789 Encounter for other orthopedic aftercare: Secondary | ICD-10-CM

## 2015-03-28 NOTE — Therapy (Signed)
[facility] [address] [place], [place], [address] Phone: [phone]   Fax:  [phone]  Physical Therapy Treatment  Patient Details  Name: Steven Logan MRN: [id number] Date of Birth: 1970-11-11 Referring Provider: Rachell Cipro, MD  PHYSICAL THERAPY DISCHARGE SUMMARY  Visits from Start of Care: 9  Current functional level related to goals / functional outcomes: See below   Remaining deficits: See Below   Education / Equipment: Prosthetic training & HEP /fitness plan  Plan: Patient agrees to discharge.  Patient goals were met. Patient is being discharged due to meeting the stated rehab goals.  ?????       Encounter Date: 03/27/2015      PT End of Session - 03/27/15 1400    Visit Number 9   Number of Visits 9   Date for PT Re-Evaluation 03/25/15   Authorization Type BCBS with initial authorization for 9 weeks of care. Patient has missed appts due to health. PT reset target date to 03/25/2015   PT Start Time 1315   PT Stop Time 1400   PT Time Calculation (min) 45 min   Equipment Utilized During Treatment Gait belt   Activity Tolerance Patient tolerated treatment well   Behavior During Therapy [facility] for tasks assessed/performed      Past Medical History  Diagnosis Date  . Eczema   . Morbid obesity ([place])   . Gout   . HTN (hypertension)   . Dyslipidemia   . Pulmonary embolism ([place])     3  in  lungs  at  one time...  . Abscess     great toe  . Pancreatitis   . Arthritis   . Depression   . Diabetes mellitus   . Anemia   . Pneumonia     2-3 years ago  . Shortness of breath     ?? chest  cold he has now.  10/8- no SOB  . Renal hypertension     Hemo started  Sept 2014- MWF  . Chronic kidney disease     esrd    Past Surgical History  Procedure Laterality Date  . None    . Av fistula placement Left 04/07/2012    Procedure: ARTERIOVENOUS (AV) FISTULA CREATION;  Surgeon: Angelia Mould, MD;  Location: [place];  Service: Vascular;  Laterality: Left;  . Ligation of competing branches of arteriovenous fistula Left 09/19/2012    Procedure: LIGATION OF COMPETING BRANCHES OF LEFT ARM ARTERIOVENOUS FISTULA; Vein angioplasty;  Surgeon: Angelia Mould, MD;  Location: [place];  Service: Vascular;  Laterality: Left;  . Insertion of dialysis catheter Right 09/19/2012    Procedure: INSERTION OF DIALYSIS CATHETER;  Surgeon: Angelia Mould, MD;  Location: [place];  Service: Vascular;  Laterality: Right;  . I&d extremity Right 06/02/2013    Procedure: IRRIGATION AND DEBRIDEMENT ARTHROSCOPIC RIGHT ANKLE;  Surgeon: Augustin Schooling, MD;  Location: [place];  Service: Orthopedics;  Laterality: Right;  . I&d extremity Right 06/04/2013    Procedure: IRRIGATION AND DEBRIDEMENT RIGHT FOOT/ANKLE;  Surgeon: Newt Minion, MD;  Location: [place];  Service: Orthopedics;  Laterality: Right;  . I&d extremity Right 06/06/2013    Procedure: IRRIGATION AND DEBRIDEMENT EXTREMITY;  Surgeon: Newt Minion, MD;  Location: [place];  Service: Orthopedics;  Laterality: Right;  . Amputation Right 06/12/2013    Procedure: Transtibial Amputation;  Surgeon: Newt Minion, MD;  Location: [place];  Service: Orthopedics;  Laterality: Right;  .  I&d extremity Right 06/24/2013    Procedure: IRRIGATION AND DEBRIDEMENT and Revsion of TRANSTIBIAL AMPUTATION;  Surgeon: Newt Minion, MD;  Location: [place];  Service: Orthopedics;  Laterality: Right;  . Amputation Right 06/27/2013    Procedure: AMPUTATION ABOVE KNEE;  Surgeon: Newt Minion, MD;  Location: [place];  Service: Orthopedics;  Laterality: Right;  Right Above Knee Amputation  . Fistulogram Left 08/07/2012    Procedure: FISTULOGRAM;  Surgeon: Angelia Mould, MD;  Location: [facility] CATH LAB;  Service: Cardiovascular;  Laterality: Left;  arm    There were no vitals filed for this visit.  Visit Diagnosis:  Abnormality of gait  Impaired functional mobility and activity  tolerance  Balance problems  Status post above knee amputation of right lower extremity ([place])  Encounter for prosthetic gait training  Unsteadiness      Subjective Assessment - 03/27/15 1317    Subjective No falls. He has cold which  has slowed him down.    Currently in Pain? No/denies            [facility] PT Assessment - 03/27/15 1315    Observation/Other Assessments   Focus on Therapeutic Outcomes (FOTO)  77  5 point improvement   Fear Avoidance Belief Questionnaire (FABQ)  19   Ambulation/Gait   Ambulation/Gait Yes   Ambulation/Gait Assistance 6: Modified independent (Device/Increase time)   Ambulation Distance (Feet) 1000 Feet   Assistive device Prosthesis;None   Gait Pattern Step-through pattern;Abducted- right;Decreased step length - right   Ambulation Surface Indoor;Level   Gait velocity 3.71 ft/sec comfortable; 4.49 ft/sec fast  Initial eval 3.13 ft/sec comfortable; 3.34 ft/sec fast   Stairs Yes   Stairs Assistance 6: Modified independent (Device/Increase time)   Stairs Assistance Details (indicate cue type and reason) able to descend step over step with 2 rails & use prosthesis step-to with single rail.    Stair Management Technique Two rails;Alternating pattern;One rail Right;One rail Left;Step to pattern;Forwards   Number of Stairs 5  5 reps   Ramp 6: Modified independent (Device)  prosthesis only   Ramp Details (indicate cue type and reason) good prosthetic knee control   Curb 6: Modified independent (Device/increase time)  prosthesis only   Curb Details (indicate cue type and reason) good prosthesis knee control   Berg Balance Test   Sit to Stand Able to stand without using hands and stabilize independently   Standing Unsupported Able to stand safely 2 minutes   Sitting with Back Unsupported but Feet Supported on Floor or Stool Able to sit safely and securely 2 minutes   Stand to Sit Sits safely with minimal use of hands   Transfers Able to transfer  safely, minor use of hands   Standing Unsupported with Eyes Closed Able to stand 10 seconds safely   Standing Ubsupported with Feet Together Able to place feet together independently and stand 1 minute safely   From Standing, Reach Forward with Outstretched Arm Can reach confidently >25 cm (10")   From Standing Position, Pick up Object from Floor Able to pick up shoe safely and easily   From Standing Position, Turn to Look Behind Over each Shoulder Looks behind from both sides and weight shifts well   Turn 360 Degrees Able to turn 360 degrees safely in 4 seconds or less   Standing Unsupported, Alternately Place Feet on Step/Stool Able to stand independently and safely and complete 8 steps in 20 seconds   Standing Unsupported, One Foot in Front Able to plae foot  ahead of the other independently and hold 30 seconds   Standing on One Leg Able to lift leg independently and hold 5-10 seconds   Total Score 54   Functional Gait  Assessment   Gait assessed  Yes   Gait Level Surface Walks 20 ft in less than 5.5 sec, no assistive devices, good speed, no evidence for imbalance, normal gait pattern, deviates no more than 6 in outside of the 12 in walkway width.   Change in Gait Speed Able to smoothly change walking speed without loss of balance or gait deviation. Deviate no more than 6 in outside of the 12 in walkway width.   Gait with Horizontal Head Turns Performs head turns smoothly with no change in gait. Deviates no more than 6 in outside 12 in walkway width   Gait with Vertical Head Turns Performs head turns with no change in gait. Deviates no more than 6 in outside 12 in walkway width.   Gait and Pivot Turn Pivot turns safely within 3 sec and stops quickly with no loss of balance.   Step Over Obstacle Is able to step over one shoe box (4.5 in total height) without changing gait speed. No evidence of imbalance.   Gait with Narrow Base of Support Ambulates 4-7 steps.   Gait with Eyes Closed Walks 20 ft,  uses assistive device, slower speed, mild gait deviations, deviates 6-10 in outside 12 in walkway width. Ambulates 20 ft in less than 9 sec but greater than 7 sec.   Ambulating Backwards Walks 20 ft, uses assistive device, slower speed, mild gait deviations, deviates 6-10 in outside 12 in walkway width.   Steps Alternating feet, must use rail.   Total Score 24         Prosthetics Assessment - 03/27/15 1315    Prosthetics   Prosthetic Care Independent with Skin check;Residual limb care;Prosthetic cleaning;Correct ply sock adjustment;Proper wear schedule/adjustment;Proper weight-bearing schedule/adjustment                    [facility] Adult PT Treatment/Exercise - 03/27/15 1315    Ambulation/Gait   Ambulation/Gait Assistance Details 3 Minute Walk Test = 691' without device safely, modified independent.  Gait with theraband around thighs to facilitate decreased abduction near wall in hall to decrease lateral trunk lean. Patient verbalizes how to practice Paul Dykes as HEP. Gait with walking sticks outside including grass >1500' for work on fluency & endurance.                   PT Short Term Goals - 01/28/15 1015    PT SHORT TERM GOAL #1   Title Patient descends stairs with 2 rails using prosthesis to lower weight with step-to pattern with cues only. (Target Date: 01/31/2015)   Baseline MET 01/28/2015   Time 4   Period Weeks   Status Achieved   PT SHORT TERM GOAL #2   Title Patient ambulates with head turns to scan environment maintaining path & pace with cues.  (Target Date: 01/31/2015)   Baseline MET 01/28/2015   Time 4   Period Weeks   Status Achieved   PT SHORT TERM GOAL #3   Title Patient ambulates 300' with prosthesis only with cues for deviations.  (Target Date: 01/31/2015)   Baseline MET 01/28/2015   Time 4   Period Weeks   Status Achieved   PT SHORT TERM GOAL #4   Title Patient demonstrates updated HEP to facilitate stability for balance activities and decrease gait  deviations.  (Target Date: 01/31/2015)   Baseline MET 01/28/2015   Time 4   Period Weeks   Status Achieved           PT Long Term Goals - 03/27/15 1400    PT LONG TERM GOAL #1   Title Patient ambulates 500' with prosthesis without device in less 3 minutes independently.  (Target Date: 03/25/2015)   Baseline MET 3/2/2017Patient ambulates 691' in 3 minutes independently.    Time 8   Period Weeks   Status Achieved   PT LONG TERM GOAL #2   Title Patient negotiates ramps & curbs with prosthesis only and descend stairs with 1 rail modified independent.   (Target Date: 03/25/2015)   Baseline MET 03/27/2015   Time 8   Period Weeks   Status Achieved   PT LONG TERM GOAL #3   Title Berg Balance >52/56 to decrease fall risk  (Target Date: 03/25/2015)   Baseline MET 03/27/2015  Merrilee Jansky Balance 54/56   Time 8   Period Weeks   Status Achieved   PT LONG TERM GOAL #4   Title Functional Gait Assessment >20/30 to decrease fall risk.  (Target Date: 03/25/2015)   Baseline MET 03/27/2015 FGA 24/30   Time 8   Period Weeks   Status Achieved               Plan - 03/27/15 1400    Clinical Impression Statement Patient met all LTGs. Patient has improved gait & mobility. Patient improved self-report functional status from 72 to 77, gait velocity comfortable from 3.13 to 3.71 ft/sec and fast from 3.34 to 4.49 ft/sec, Berg Balance to 54/56 (low fall risk) and Functional Gait Assessment 24/30 (low fall risk).    Pt will benefit from skilled therapeutic intervention in order to improve on the following deficits Abnormal gait;Decreased activity tolerance;Decreased balance;Decreased endurance;Decreased mobility   Rehab Potential Good   PT Frequency 1x / week   PT Duration 8 weeks   PT Treatment/Interventions ADLs/Self Care Home Management;Gait training;Stair training;Functional mobility training;Therapeutic activities;Therapeutic exercise;Balance training;Neuromuscular re-education;Patient/family  education;Prosthetic Training   PT Next Visit Plan Discharge   Consulted and Agree with Plan of Care Patient        Problem List Patient Active Problem List   Diagnosis Date Noted  . Acute blood loss anemia 06/25/2013  . Hyperglycemia 06/07/2013  . Arm DVT (deep venous thromboembolism), acute ([place]) 06/07/2013  . ESRD on dialysis ([place]) 06/05/2013  . Encephalopathy, toxic 06/03/2013  . Cellulitis and abscess of leg 06/03/2013  . Anemia in chronic kidney disease 06/03/2013  . Arm edema 06/03/2013  . Leg abscess 06/03/2013  . Severe sepsis ([place]) 06/03/2013  . Septic shock ([place]) 06/03/2013  . Septic arthritis of ankle or foot, right 06/03/2013  . Abscess of right lower leg 06/02/2013  . Wrist lump 07/05/2012  . Swelling of limb-Left index and middle fingers 07/05/2012  . Aftercare following surgery of the circulatory system, [place] 07/05/2012  . Tingling-left wrist and some pain. 07/05/2012  . Pulmonary edema 05/04/2012  . Acute respiratory failure ([place]) 05/04/2012  . CKD (chronic kidney disease) stage 5, GFR less than 15 ml/min (HCC) 05/04/2012  . Hyperkalemia 05/04/2012  . Pre-operative cardiovascular examination 03/15/2012  . Iron deficiency anemia 09/22/2010  . HTN (hypertension) 09/22/2010  . Hyperlipidemia 09/22/2010  . Gout 09/22/2010  . CRI (chronic renal insufficiency) 09/22/2010  . Pulmonary embolism, bilateral ([place]) 09/22/2010    Jeury Mcnab PT, DPT 03/28/2015, 10:02 AM  [facility]  [address] [place], [place], [address] Phone: [phone]   Fax:  [phone]  Name: Steven Logan MRN: [id number] Date of Birth: [birth year]

## 2015-05-24 ENCOUNTER — Emergency Department (HOSPITAL_COMMUNITY)
Admission: EM | Admit: 2015-05-24 | Discharge: 2015-05-25 | Disposition: A | Discharge status: Home / Self Care | Payer: BLUE CROSS/BLUE SHIELD | Attending: Emergency Medicine | Admitting: Emergency Medicine

## 2015-05-24 ENCOUNTER — Encounter (HOSPITAL_COMMUNITY): Payer: Self-pay | Admitting: Emergency Medicine

## 2015-05-24 DIAGNOSIS — N186 End stage renal disease: Secondary | ICD-10-CM | POA: Insufficient documentation

## 2015-05-24 DIAGNOSIS — Z872 Personal history of diseases of the skin and subcutaneous tissue: Secondary | ICD-10-CM | POA: Diagnosis not present

## 2015-05-24 DIAGNOSIS — S8991XA Unspecified injury of right lower leg, initial encounter: Secondary | ICD-10-CM | POA: Insufficient documentation

## 2015-05-24 DIAGNOSIS — Z8659 Personal history of other mental and behavioral disorders: Secondary | ICD-10-CM | POA: Diagnosis not present

## 2015-05-24 DIAGNOSIS — W19XXXA Unspecified fall, initial encounter: Secondary | ICD-10-CM

## 2015-05-24 DIAGNOSIS — T148 Other injury of unspecified body region: Secondary | ICD-10-CM | POA: Insufficient documentation

## 2015-05-24 DIAGNOSIS — Z8701 Personal history of pneumonia (recurrent): Secondary | ICD-10-CM | POA: Insufficient documentation

## 2015-05-24 DIAGNOSIS — K5901 Slow transit constipation: Secondary | ICD-10-CM | POA: Insufficient documentation

## 2015-05-24 DIAGNOSIS — S3991XA Unspecified injury of abdomen, initial encounter: Secondary | ICD-10-CM | POA: Diagnosis present

## 2015-05-24 DIAGNOSIS — Z79899 Other long term (current) drug therapy: Secondary | ICD-10-CM | POA: Diagnosis not present

## 2015-05-24 DIAGNOSIS — I120 Hypertensive chronic kidney disease with stage 5 chronic kidney disease or end stage renal disease: Secondary | ICD-10-CM | POA: Insufficient documentation

## 2015-05-24 DIAGNOSIS — Y9289 Other specified places as the place of occurrence of the external cause: Secondary | ICD-10-CM | POA: Diagnosis not present

## 2015-05-24 DIAGNOSIS — R110 Nausea: Secondary | ICD-10-CM | POA: Insufficient documentation

## 2015-05-24 DIAGNOSIS — E119 Type 2 diabetes mellitus without complications: Secondary | ICD-10-CM | POA: Diagnosis not present

## 2015-05-24 DIAGNOSIS — Z862 Personal history of diseases of the blood and blood-forming organs and certain disorders involving the immune mechanism: Secondary | ICD-10-CM | POA: Insufficient documentation

## 2015-05-24 DIAGNOSIS — Y998 Other external cause status: Secondary | ICD-10-CM | POA: Diagnosis not present

## 2015-05-24 DIAGNOSIS — Y9355 Activity, bike riding: Secondary | ICD-10-CM | POA: Insufficient documentation

## 2015-05-24 DIAGNOSIS — Z8739 Personal history of other diseases of the musculoskeletal system and connective tissue: Secondary | ICD-10-CM | POA: Insufficient documentation

## 2015-05-24 DIAGNOSIS — T148XXA Other injury of unspecified body region, initial encounter: Secondary | ICD-10-CM

## 2015-05-24 DIAGNOSIS — Z89611 Acquired absence of right leg above knee: Secondary | ICD-10-CM | POA: Diagnosis not present
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
## 2015-05-25 ENCOUNTER — Emergency Department (HOSPITAL_COMMUNITY): Payer: BLUE CROSS/BLUE SHIELD

## 2015-05-25 MED ORDER — DOCUSATE SODIUM 100 MG PO CAPS: 100.0000 mg | ORAL_CAPSULE | Freq: Two times a day (BID) | ORAL | Status: DC

## 2015-05-25 MED ORDER — POLYETHYLENE GLYCOL 3350 17 G PO PACK: 17.0000 g | PACK | Freq: Every day | ORAL | Status: DC

## 2015-05-25 NOTE — Discharge Instructions (Signed)
Constipation, Adult Constipation is when a person has fewer than three bowel movements a week, has difficulty having a bowel movement, or has stools that are dry, hard, or larger than normal. As people grow older, constipation is more common. A low-fiber diet, not taking in enough fluids, and taking certain medicines may make constipation worse.  CAUSES   Certain medicines, such as antidepressants, pain medicine, iron supplements, antacids, and water pills.   Certain diseases, such as diabetes, irritable bowel syndrome (IBS), thyroid disease, or depression.   Not drinking enough water.   Not eating enough fiber-rich foods.   Stress or travel.   Lack of physical activity or exercise.   Ignoring the urge to have a bowel movement.   Using laxatives too much.  SIGNS AND SYMPTOMS   Having fewer than three bowel movements a week.   Straining to have a bowel movement.   Having stools that are hard, dry, or larger than normal.   Feeling full or bloated.   Pain in the lower abdomen.   Not feeling relief after having a bowel movement.  DIAGNOSIS  Your health care provider will take a medical history and perform a physical exam. Further testing may be done for severe constipation. Some tests may include:  A barium enema X-ray to examine your rectum, colon, and, sometimes, your small intestine.   A sigmoidoscopy to examine your lower colon.   A colonoscopy to examine your entire colon. TREATMENT  Treatment will depend on the severity of your constipation and what is causing it. Some dietary treatments include drinking more fluids and eating more fiber-rich foods. Lifestyle treatments may include regular exercise. If these diet and lifestyle recommendations do not help, your health care provider may recommend taking over-the-counter laxative medicines to help you have bowel movements. Prescription medicines may be prescribed if over-the-counter medicines do not work.    HOME CARE INSTRUCTIONS   Eat foods that have a lot of fiber, such as fruits, vegetables, whole grains, and beans.  Limit foods high in fat and processed sugars, such as french fries, hamburgers, cookies, candies, and soda.   A fiber supplement may be added to your diet if you cannot get enough fiber from foods.   Drink enough fluids to keep your urine clear or pale yellow.   Exercise regularly or as directed by your health care provider.   Go to the restroom when you have the urge to go. Do not hold it.   Only take over-the-counter or prescription medicines as directed by your health care provider. Do not take other medicines for constipation without talking to your health care provider first.  Gap IF:   You have bright red blood in your stool.   Your constipation lasts for more than 4 days or gets worse.   You have abdominal or rectal pain.   You have thin, pencil-like stools.   You have unexplained weight loss. MAKE SURE YOU:   Understand these instructions.  Will watch your condition.  Will get help right away if you are not doing well or get worse.   This information is not intended to replace advice given to you by your health care provider. Make sure you discuss any questions you have with your health care provider.   Document Released: 10/10/2003 Document Revised: 02/01/2014 Document Reviewed: 10/23/2012 Elsevier Interactive Patient Education 2016 [place] A contusion is a deep bruise. Contusions are the result of a blunt injury to tissues and muscle fibers  under the skin. The injury causes bleeding under the skin. The skin overlying the contusion may turn blue, purple, or yellow. Minor injuries will give you a painless contusion, but more severe contusions may stay painful and swollen for a few weeks.  CAUSES  This condition is usually caused by a blow, trauma, or direct force to an area of the body. SYMPTOMS   Symptoms of this condition include:  Swelling of the injured area.  Pain and tenderness in the injured area.  Discoloration. The area may have redness and then turn blue, purple, or yellow. DIAGNOSIS  This condition is diagnosed based on a physical exam and medical history. An X-ray, CT scan, or MRI may be needed to determine if there are any associated injuries, such as broken bones (fractures). TREATMENT  Specific treatment for this condition depends on what area of the body was injured. In general, the best treatment for a contusion is resting, icing, applying pressure to (compression), and elevating the injured area. This is often called the RICE strategy. Over-the-counter anti-inflammatory medicines may also be recommended for pain control.  HOME CARE INSTRUCTIONS   Rest the injured area.  If directed, apply ice to the injured area:  Put ice in a plastic bag.  Place a towel between your skin and the bag.  Leave the ice on for 20 minutes, 2-3 times per day.  If directed, apply light compression to the injured area using an elastic bandage. Make sure the bandage is not wrapped too tightly. Remove and reapply the bandage as directed by your health care provider.  If possible, raise (elevate) the injured area above the level of your heart while you are sitting or lying down.  Take over-the-counter and prescription medicines only as told by your health care provider. SEEK MEDICAL CARE IF:  Your symptoms do not improve after several days of treatment.  Your symptoms get worse.  You have difficulty moving the injured area. SEEK IMMEDIATE MEDICAL CARE IF:   You have severe pain.  You have numbness in a hand or foot.  Your hand or foot turns pale or cold.   This information is not intended to replace advice given to you by your health care provider. Make sure you discuss any questions you have with your health care provider.   Document Released: 10/21/2004 Document Revised:  10/02/2014 Document Reviewed: 05/29/2014 Elsevier Interactive Patient Education [employer].

## 2015-10-25 IMAGING — CT CT ABD-PELV W/O CM
2 of 4 series · 15 of 46 positions shown, 17 images · non-contrast
Comparison: Prior ultrasound from 05/05/2011.  .

CLINICAL DATA: Left-sided abdominal pain

EXAM:
CT CHEST, ABDOMEN AND PELVIS WITHOUT CONTRAST
TECHNIQUE: Multidetector CT imaging of the chest, abdomen and pelvis was
performed following the standard protocol without IV contrast.

[Series 2: stone study 5.0 i30f 1 · axial · 0.84mm/px · z∈[+758,+1228]mm · 12 of 104 slices shown, 14 images]
[im 5/104  soft-tissue]
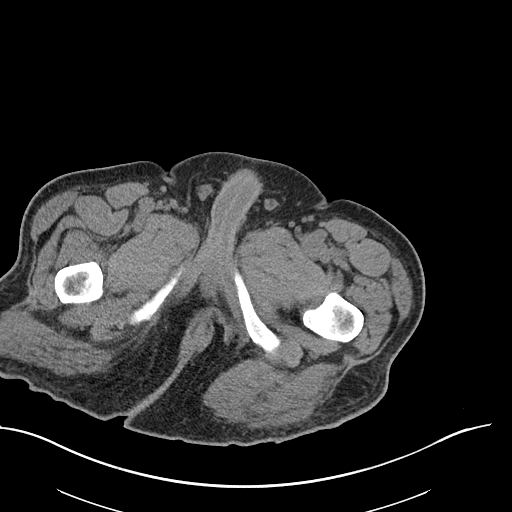
[im 5/104  bone]
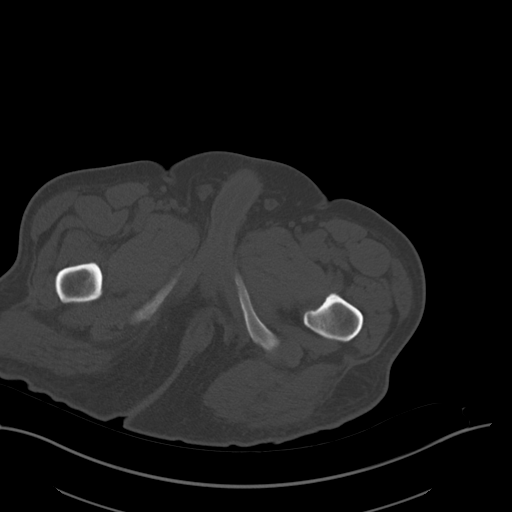
[im 14/104  soft-tissue]
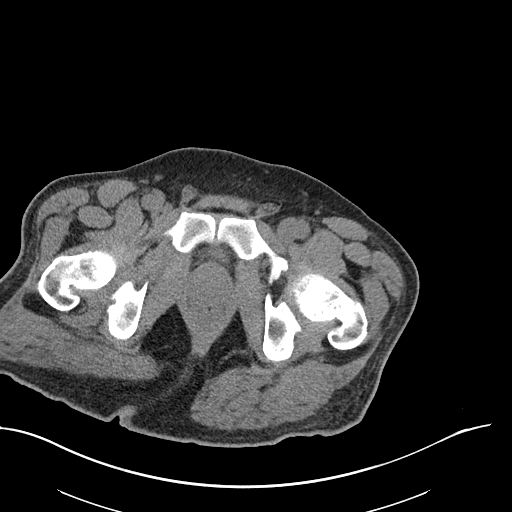
[im 23/104  soft-tissue]
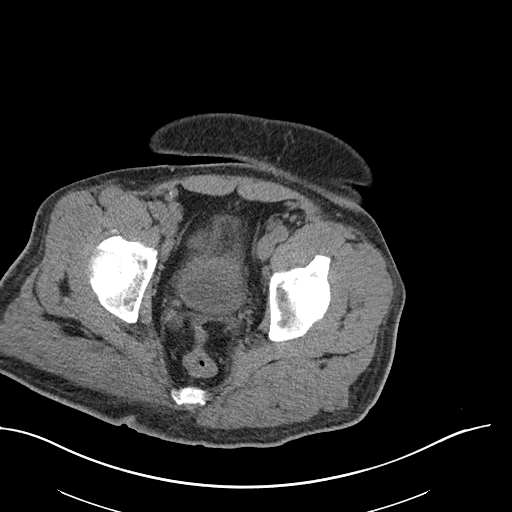
[im 32/104  soft-tissue]
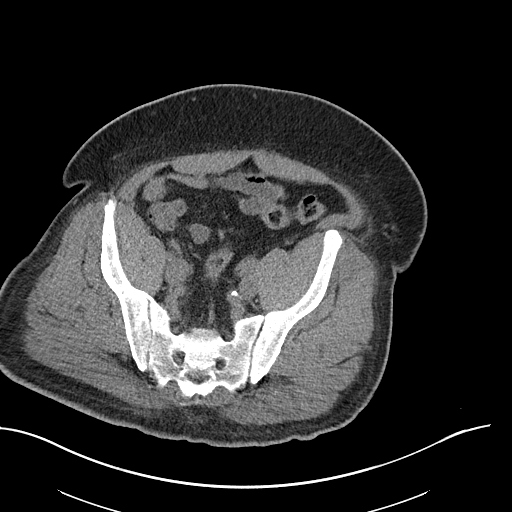
[im 41/104  soft-tissue]
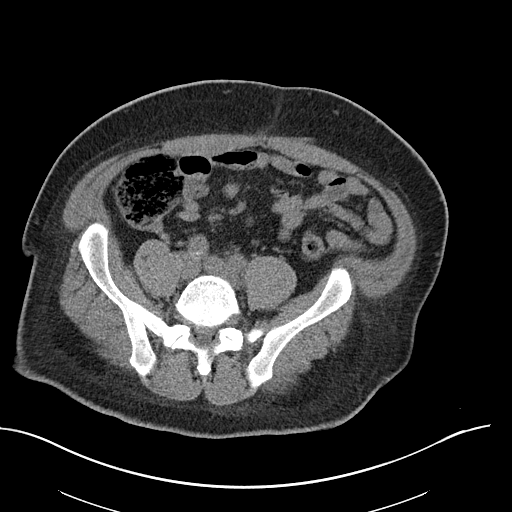
[im 50/104  soft-tissue]
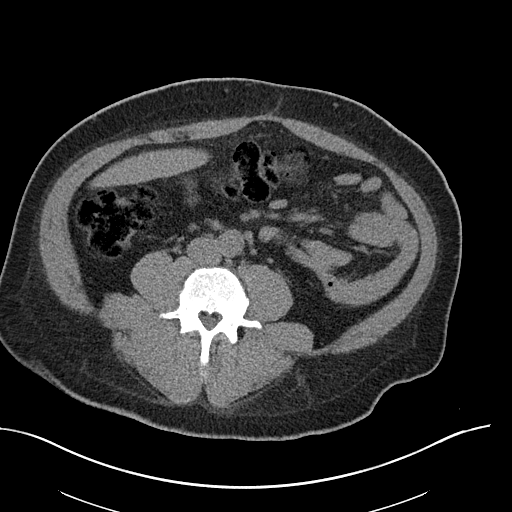
[im 54/104  soft-tissue]
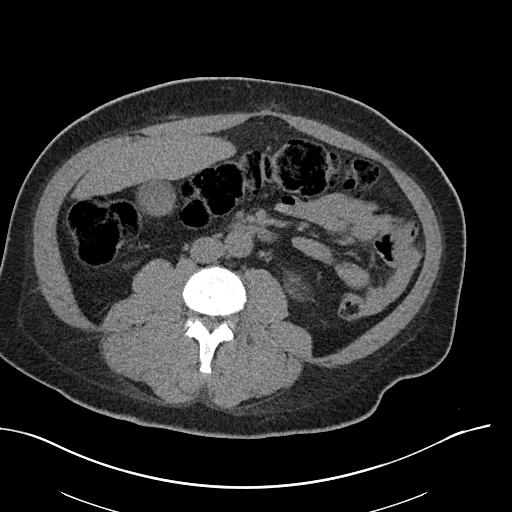
[im 63/104  soft-tissue]
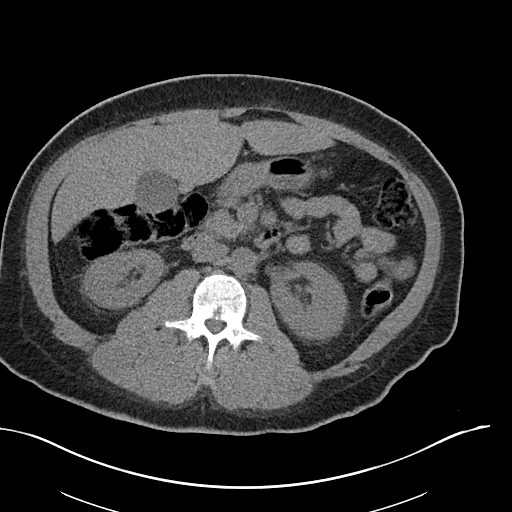
[im 72/104  soft-tissue]
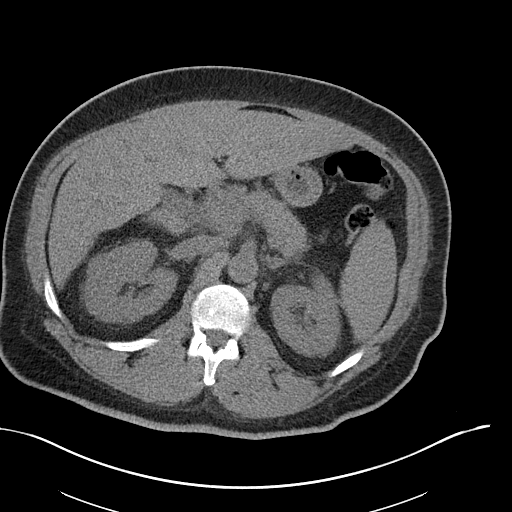
[im 72/104  bone]
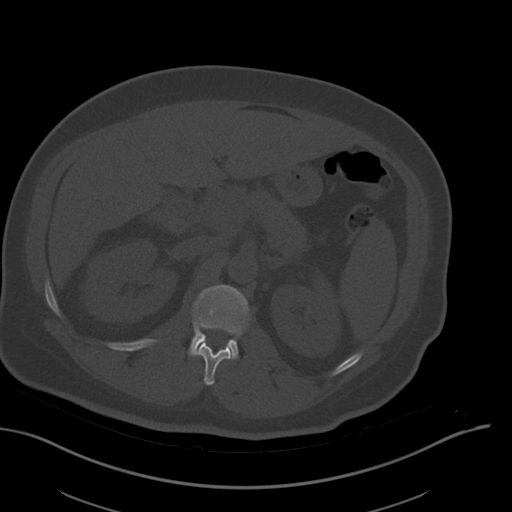
[im 81/104  soft-tissue]
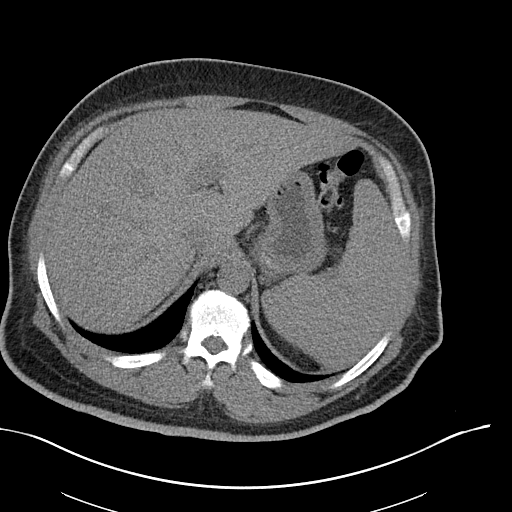
[im 90/104  soft-tissue]
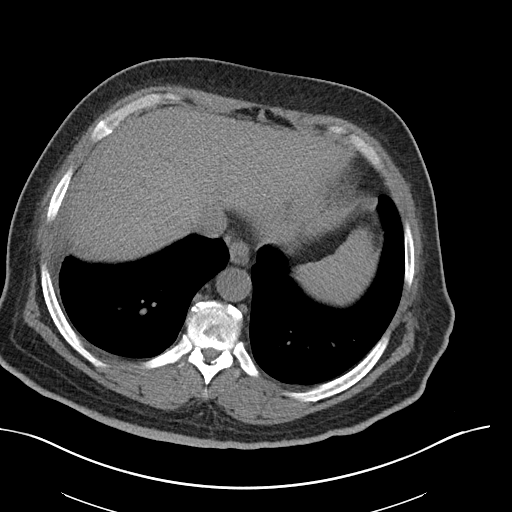
[im 99/104  soft-tissue]
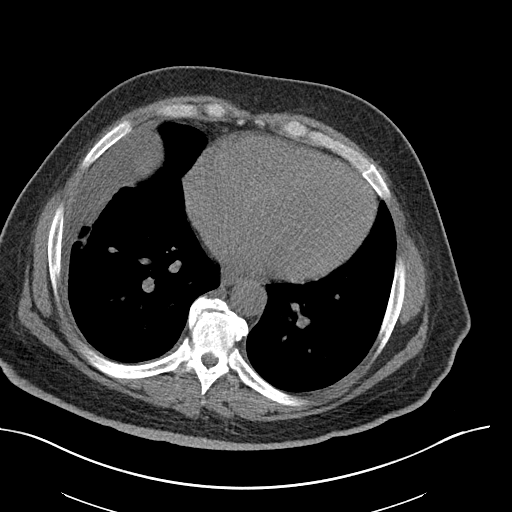

[Series 5: coronal soft tissue · coronal · 0.92mm/px · 3 of 97 slices shown]
[im 33/97  soft-tissue]
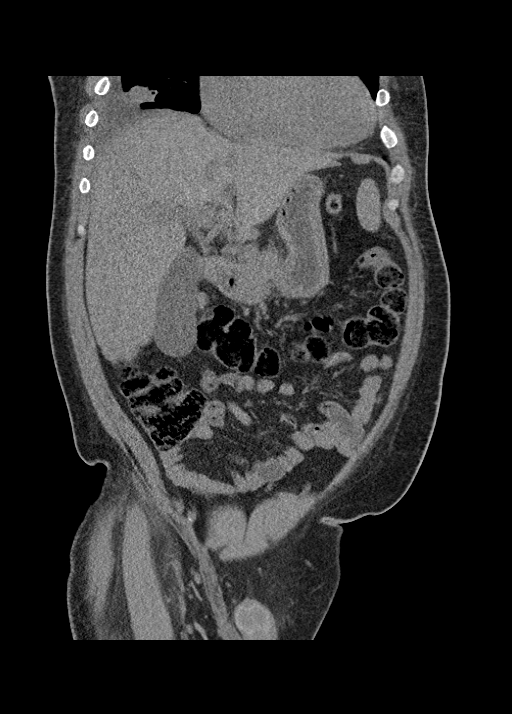
[im 43/97  soft-tissue]
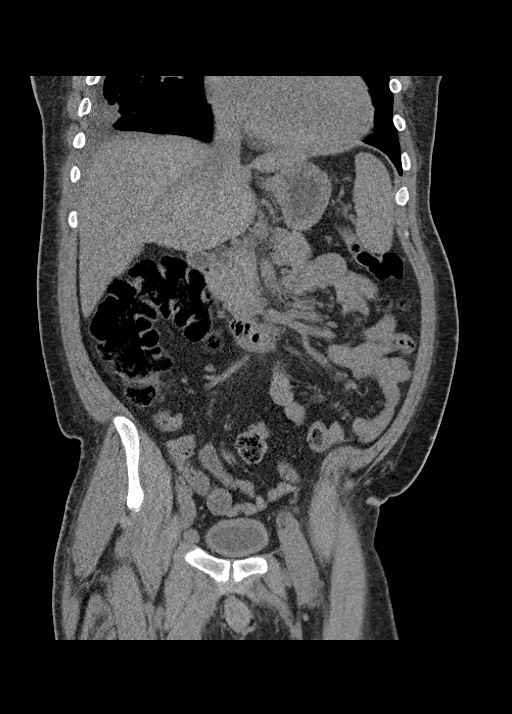
[im 54/97  soft-tissue]
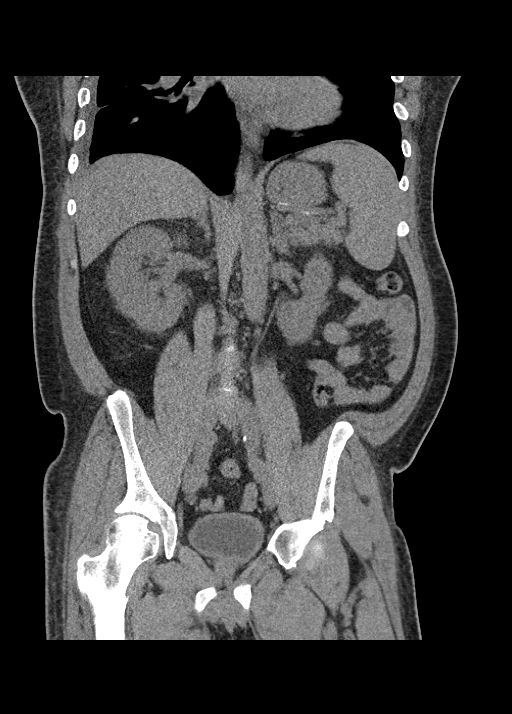

[15 of 46 positions shown; findings below may reference images not displayed]

FINDINGS: There is a loculated right pleural effusion along the lateral wall
of the right hemi thorax. Associated round soft tissue opacity along
the anterior aspect of this collection may reflect associated
rounded atelectasis. The effusion itself measures simple fluid
density. The visualized lung bases are otherwise clear. Mild
cardiomegaly noted.

Limited noncontrast evaluation of the liver is unremarkable.
Gallbladder within normal limits. No biliary ductal dilatation. The
spleen, adrenal glands, and pancreas demonstrate a normal unenhanced
appearance.

Probable small parapelvic cysts noted within the right kidney. 4 mm
nonobstructive stone present within the interpolar right kidney. No
hydronephrosis or evidence of obstructive uropathy. Mild perinephric
fat stranding seen bilaterally.

No evidence of bowel obstruction. Stomach is within normal limits.
No abnormal wall thickening or inflammatory fat stranding seen about
the bowels. Appendix is well visualized in the right lower quadrant
and is of normal caliber and appearance without associated
inflammatory changes to suggest acute appendicitis.

Mild circumferential bladder wall thickening likely related to
incomplete distension. Prostate is normal.

No free air or fluid. She scattered atherosclerotic plaques and
within the iliac arteries bilaterally.

No acute osseous abnormality. No worrisome lytic or blastic osseous
lesions. Scattered degenerative endplate Schmorl's nodes and within
the lower thoracic spine.
IMPRESSION: 1. Loculated right pleural effusion with probable associated rounded
atelectasis as above, incompletely evaluated on this examination.
2. No CT evidence of acute intra-abdominal or pelvic process.
3. 4 mm nonobstructive right renal calculus.

## 2015-10-25 IMAGING — CR DG HAND COMPLETE 3+V*R*
3 series · 3 of 3 positions shown · non-contrast
Comparison: 08/05/2007

CLINICAL DATA: Right ankle and foot swelling, history of gout

EXAM:
RIGHT HAND - COMPLETE 3+ VIEW

[x hand pa right]
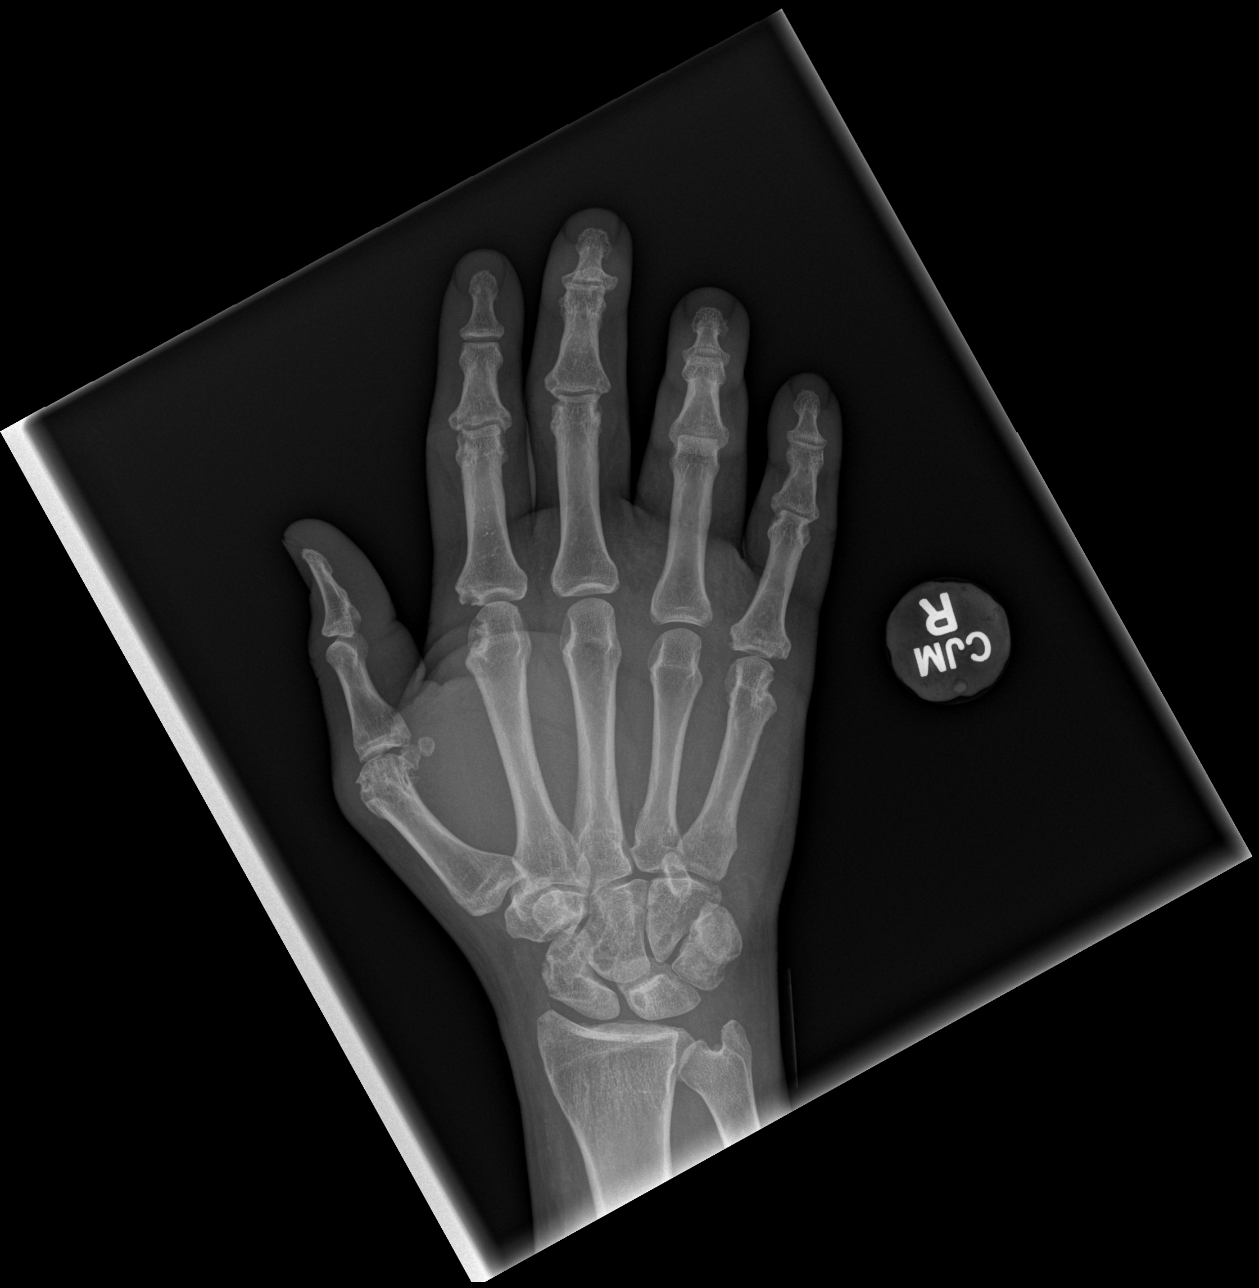

[x hand obl right]
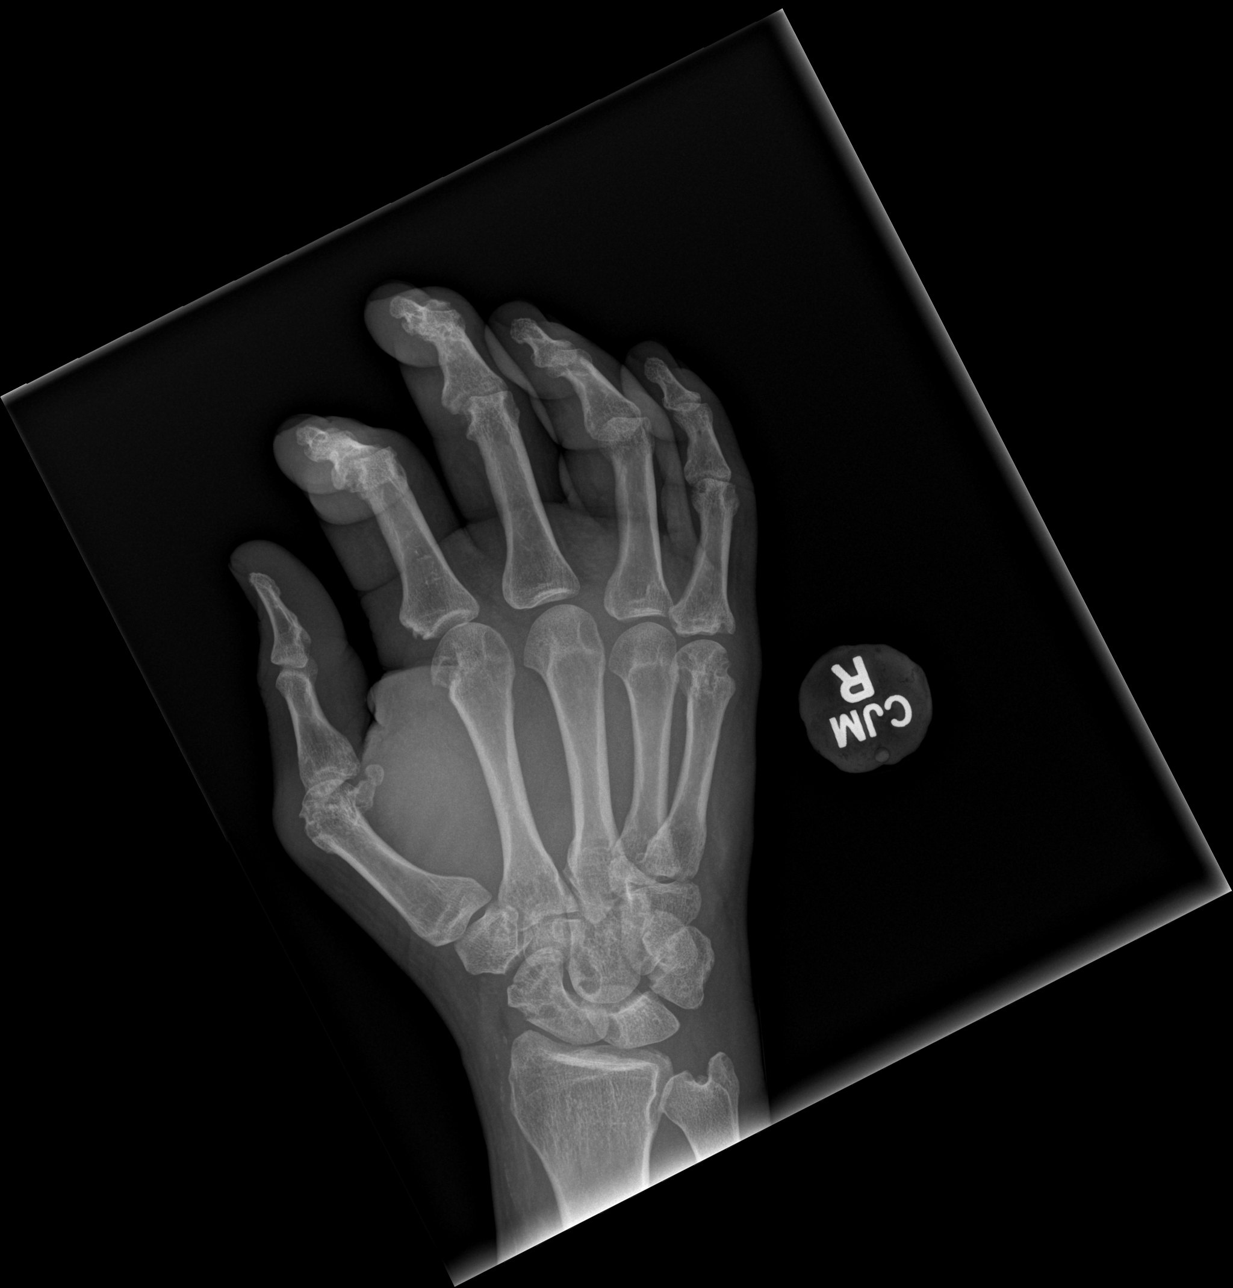

[x hand lat right]
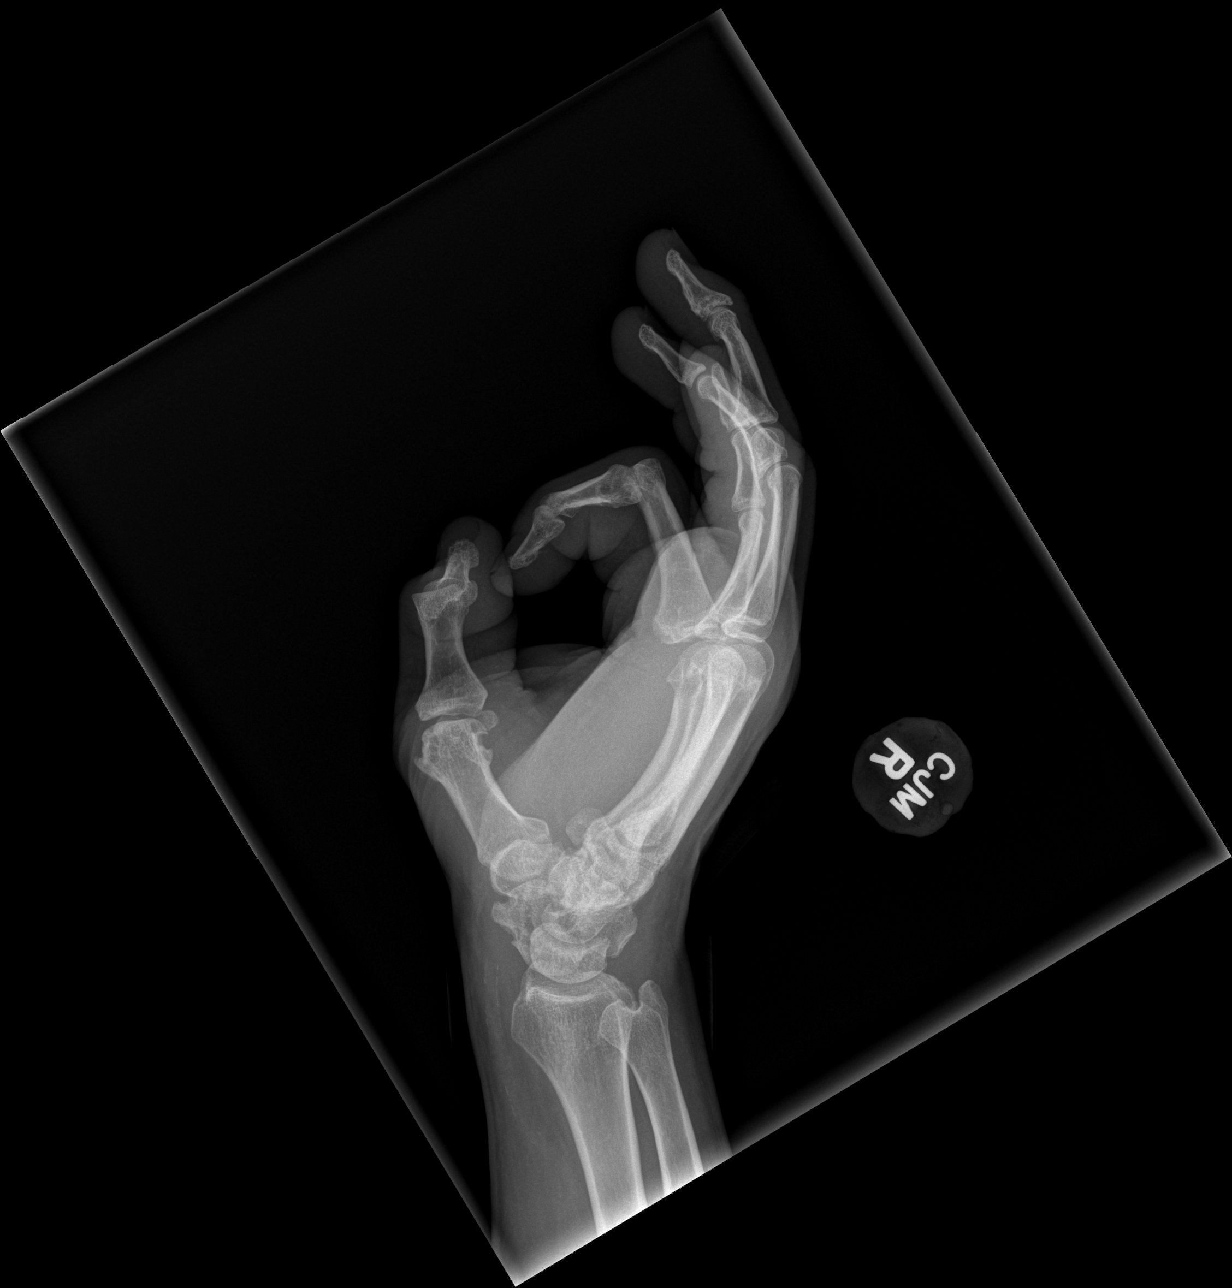

[3 of 3 positions shown; findings below may reference images not displayed]

FINDINGS: Three views of the right hand submitted. No acute fracture or
subluxation. Cystic osteoarthritic changes are noted carpal bones.
Erosive changes are noted distal aspect of first second and fifth
metacarpal. Small erosive changes at the base of proximal phalanx
second and fifth finger. Subtle erosive changes are noted distal
aspect proximal phalanx second finger third finger and fifth finger.
Osteoarthritic changes are noted distal interphalangeal joint second
third and fourth finger.
IMPRESSION: No acute fracture or subluxation. Osteoarthritic changes as
described above.

## 2016-01-28 DIAGNOSIS — N186 End stage renal disease: Secondary | ICD-10-CM | POA: Diagnosis not present

## 2016-01-28 DIAGNOSIS — E119 Type 2 diabetes mellitus without complications: Secondary | ICD-10-CM | POA: Diagnosis not present

## 2016-01-28 DIAGNOSIS — N2581 Secondary hyperparathyroidism of renal origin: Secondary | ICD-10-CM | POA: Diagnosis not present

## 2016-01-28 DIAGNOSIS — D509 Iron deficiency anemia, unspecified: Secondary | ICD-10-CM | POA: Diagnosis not present

## 2016-01-28 DIAGNOSIS — D72818 Other decreased white blood cell count: Secondary | ICD-10-CM | POA: Diagnosis not present

## 2016-01-30 DIAGNOSIS — N186 End stage renal disease: Secondary | ICD-10-CM | POA: Diagnosis not present

## 2016-01-30 DIAGNOSIS — N2581 Secondary hyperparathyroidism of renal origin: Secondary | ICD-10-CM | POA: Diagnosis not present

## 2016-01-30 DIAGNOSIS — E119 Type 2 diabetes mellitus without complications: Secondary | ICD-10-CM | POA: Diagnosis not present

## 2016-01-30 DIAGNOSIS — D509 Iron deficiency anemia, unspecified: Secondary | ICD-10-CM | POA: Diagnosis not present

## 2016-01-30 DIAGNOSIS — D72818 Other decreased white blood cell count: Secondary | ICD-10-CM | POA: Diagnosis not present

## 2016-02-02 DIAGNOSIS — E119 Type 2 diabetes mellitus without complications: Secondary | ICD-10-CM | POA: Diagnosis not present

## 2016-02-02 DIAGNOSIS — N186 End stage renal disease: Secondary | ICD-10-CM | POA: Diagnosis not present

## 2016-02-02 DIAGNOSIS — D509 Iron deficiency anemia, unspecified: Secondary | ICD-10-CM | POA: Diagnosis not present

## 2016-02-02 DIAGNOSIS — N2581 Secondary hyperparathyroidism of renal origin: Secondary | ICD-10-CM | POA: Diagnosis not present

## 2016-02-02 DIAGNOSIS — D72818 Other decreased white blood cell count: Secondary | ICD-10-CM | POA: Diagnosis not present

## 2016-02-04 DIAGNOSIS — E119 Type 2 diabetes mellitus without complications: Secondary | ICD-10-CM | POA: Diagnosis not present

## 2016-02-04 DIAGNOSIS — D509 Iron deficiency anemia, unspecified: Secondary | ICD-10-CM | POA: Diagnosis not present

## 2016-02-04 DIAGNOSIS — N2581 Secondary hyperparathyroidism of renal origin: Secondary | ICD-10-CM | POA: Diagnosis not present

## 2016-02-04 DIAGNOSIS — E1129 Type 2 diabetes mellitus with other diabetic kidney complication: Secondary | ICD-10-CM | POA: Diagnosis not present

## 2016-02-04 DIAGNOSIS — D72818 Other decreased white blood cell count: Secondary | ICD-10-CM | POA: Diagnosis not present

## 2016-02-04 DIAGNOSIS — N186 End stage renal disease: Secondary | ICD-10-CM | POA: Diagnosis not present

## 2016-02-06 DIAGNOSIS — E119 Type 2 diabetes mellitus without complications: Secondary | ICD-10-CM | POA: Diagnosis not present

## 2016-02-06 DIAGNOSIS — N186 End stage renal disease: Secondary | ICD-10-CM | POA: Diagnosis not present

## 2016-02-06 DIAGNOSIS — D509 Iron deficiency anemia, unspecified: Secondary | ICD-10-CM | POA: Diagnosis not present

## 2016-02-06 DIAGNOSIS — D72818 Other decreased white blood cell count: Secondary | ICD-10-CM | POA: Diagnosis not present

## 2016-02-06 DIAGNOSIS — N2581 Secondary hyperparathyroidism of renal origin: Secondary | ICD-10-CM | POA: Diagnosis not present

## 2016-02-09 DIAGNOSIS — N2581 Secondary hyperparathyroidism of renal origin: Secondary | ICD-10-CM | POA: Diagnosis not present

## 2016-02-09 DIAGNOSIS — E119 Type 2 diabetes mellitus without complications: Secondary | ICD-10-CM | POA: Diagnosis not present

## 2016-02-09 DIAGNOSIS — D72818 Other decreased white blood cell count: Secondary | ICD-10-CM | POA: Diagnosis not present

## 2016-02-09 DIAGNOSIS — D509 Iron deficiency anemia, unspecified: Secondary | ICD-10-CM | POA: Diagnosis not present

## 2016-02-09 DIAGNOSIS — N186 End stage renal disease: Secondary | ICD-10-CM | POA: Diagnosis not present

## 2016-02-11 DIAGNOSIS — D509 Iron deficiency anemia, unspecified: Secondary | ICD-10-CM | POA: Diagnosis not present

## 2016-02-11 DIAGNOSIS — N186 End stage renal disease: Secondary | ICD-10-CM | POA: Diagnosis not present

## 2016-02-11 DIAGNOSIS — D72818 Other decreased white blood cell count: Secondary | ICD-10-CM | POA: Diagnosis not present

## 2016-02-11 DIAGNOSIS — E119 Type 2 diabetes mellitus without complications: Secondary | ICD-10-CM | POA: Diagnosis not present

## 2016-02-11 DIAGNOSIS — N2581 Secondary hyperparathyroidism of renal origin: Secondary | ICD-10-CM | POA: Diagnosis not present

## 2016-02-13 DIAGNOSIS — D72818 Other decreased white blood cell count: Secondary | ICD-10-CM | POA: Diagnosis not present

## 2016-02-13 DIAGNOSIS — N2581 Secondary hyperparathyroidism of renal origin: Secondary | ICD-10-CM | POA: Diagnosis not present

## 2016-02-13 DIAGNOSIS — E119 Type 2 diabetes mellitus without complications: Secondary | ICD-10-CM | POA: Diagnosis not present

## 2016-02-13 DIAGNOSIS — D509 Iron deficiency anemia, unspecified: Secondary | ICD-10-CM | POA: Diagnosis not present

## 2016-02-13 DIAGNOSIS — N186 End stage renal disease: Secondary | ICD-10-CM | POA: Diagnosis not present

## 2016-02-16 DIAGNOSIS — D509 Iron deficiency anemia, unspecified: Secondary | ICD-10-CM | POA: Diagnosis not present

## 2016-02-16 DIAGNOSIS — E119 Type 2 diabetes mellitus without complications: Secondary | ICD-10-CM | POA: Diagnosis not present

## 2016-02-16 DIAGNOSIS — D72818 Other decreased white blood cell count: Secondary | ICD-10-CM | POA: Diagnosis not present

## 2016-02-16 DIAGNOSIS — N2581 Secondary hyperparathyroidism of renal origin: Secondary | ICD-10-CM | POA: Diagnosis not present

## 2016-02-16 DIAGNOSIS — N186 End stage renal disease: Secondary | ICD-10-CM | POA: Diagnosis not present

## 2016-02-18 DIAGNOSIS — N2581 Secondary hyperparathyroidism of renal origin: Secondary | ICD-10-CM | POA: Diagnosis not present

## 2016-02-18 DIAGNOSIS — N186 End stage renal disease: Secondary | ICD-10-CM | POA: Diagnosis not present

## 2016-02-18 DIAGNOSIS — E119 Type 2 diabetes mellitus without complications: Secondary | ICD-10-CM | POA: Diagnosis not present

## 2016-02-18 DIAGNOSIS — D72818 Other decreased white blood cell count: Secondary | ICD-10-CM | POA: Diagnosis not present

## 2016-02-18 DIAGNOSIS — D509 Iron deficiency anemia, unspecified: Secondary | ICD-10-CM | POA: Diagnosis not present

## 2016-02-20 DIAGNOSIS — D509 Iron deficiency anemia, unspecified: Secondary | ICD-10-CM | POA: Diagnosis not present

## 2016-02-20 DIAGNOSIS — N2581 Secondary hyperparathyroidism of renal origin: Secondary | ICD-10-CM | POA: Diagnosis not present

## 2016-02-20 DIAGNOSIS — E119 Type 2 diabetes mellitus without complications: Secondary | ICD-10-CM | POA: Diagnosis not present

## 2016-02-20 DIAGNOSIS — N186 End stage renal disease: Secondary | ICD-10-CM | POA: Diagnosis not present

## 2016-02-20 DIAGNOSIS — D72818 Other decreased white blood cell count: Secondary | ICD-10-CM | POA: Diagnosis not present

## 2016-02-23 DIAGNOSIS — N2581 Secondary hyperparathyroidism of renal origin: Secondary | ICD-10-CM | POA: Diagnosis not present

## 2016-02-23 DIAGNOSIS — D509 Iron deficiency anemia, unspecified: Secondary | ICD-10-CM | POA: Diagnosis not present

## 2016-02-23 DIAGNOSIS — E119 Type 2 diabetes mellitus without complications: Secondary | ICD-10-CM | POA: Diagnosis not present

## 2016-02-23 DIAGNOSIS — D72818 Other decreased white blood cell count: Secondary | ICD-10-CM | POA: Diagnosis not present

## 2016-02-23 DIAGNOSIS — N186 End stage renal disease: Secondary | ICD-10-CM | POA: Diagnosis not present

## 2016-02-25 ENCOUNTER — Telehealth (INDEPENDENT_AMBULATORY_CARE_PROVIDER_SITE_OTHER): Payer: Self-pay | Admitting: Orthopedic Surgery

## 2016-02-25 DIAGNOSIS — I120 Hypertensive chronic kidney disease with stage 5 chronic kidney disease or end stage renal disease: Secondary | ICD-10-CM | POA: Diagnosis not present

## 2016-02-25 DIAGNOSIS — D72818 Other decreased white blood cell count: Secondary | ICD-10-CM | POA: Diagnosis not present

## 2016-02-25 DIAGNOSIS — N186 End stage renal disease: Secondary | ICD-10-CM | POA: Diagnosis not present

## 2016-02-25 DIAGNOSIS — N2581 Secondary hyperparathyroidism of renal origin: Secondary | ICD-10-CM | POA: Diagnosis not present

## 2016-02-25 DIAGNOSIS — E119 Type 2 diabetes mellitus without complications: Secondary | ICD-10-CM | POA: Diagnosis not present

## 2016-02-25 DIAGNOSIS — Z992 Dependence on renal dialysis: Secondary | ICD-10-CM | POA: Diagnosis not present

## 2016-02-25 DIAGNOSIS — D509 Iron deficiency anemia, unspecified: Secondary | ICD-10-CM | POA: Diagnosis not present

## 2016-02-25 NOTE — Telephone Encounter (Signed)
Rx from [facility] for prosthetic sent to [facility]

## 2016-02-27 DIAGNOSIS — N186 End stage renal disease: Secondary | ICD-10-CM | POA: Diagnosis not present

## 2016-02-27 DIAGNOSIS — E119 Type 2 diabetes mellitus without complications: Secondary | ICD-10-CM | POA: Diagnosis not present

## 2016-02-27 DIAGNOSIS — N2581 Secondary hyperparathyroidism of renal origin: Secondary | ICD-10-CM | POA: Diagnosis not present

## 2016-02-27 DIAGNOSIS — D509 Iron deficiency anemia, unspecified: Secondary | ICD-10-CM | POA: Diagnosis not present

## 2016-03-01 DIAGNOSIS — N2581 Secondary hyperparathyroidism of renal origin: Secondary | ICD-10-CM | POA: Diagnosis not present

## 2016-03-01 DIAGNOSIS — N186 End stage renal disease: Secondary | ICD-10-CM | POA: Diagnosis not present

## 2016-03-01 DIAGNOSIS — E119 Type 2 diabetes mellitus without complications: Secondary | ICD-10-CM | POA: Diagnosis not present

## 2016-03-01 DIAGNOSIS — D509 Iron deficiency anemia, unspecified: Secondary | ICD-10-CM | POA: Diagnosis not present

## 2016-03-01 NOTE — Telephone Encounter (Signed)
rx faxed to [facility] at [phone]

## 2016-03-03 ENCOUNTER — Ambulatory Visit: Payer: Medicare Other | Attending: Nephrology | Admitting: Physical Therapy

## 2016-03-03 DIAGNOSIS — R2681 Unsteadiness on feet: Secondary | ICD-10-CM | POA: Diagnosis not present

## 2016-03-03 DIAGNOSIS — N2581 Secondary hyperparathyroidism of renal origin: Secondary | ICD-10-CM | POA: Diagnosis not present

## 2016-03-03 DIAGNOSIS — N186 End stage renal disease: Secondary | ICD-10-CM | POA: Diagnosis not present

## 2016-03-03 DIAGNOSIS — R2689 Other abnormalities of gait and mobility: Secondary | ICD-10-CM | POA: Diagnosis not present

## 2016-03-03 DIAGNOSIS — D509 Iron deficiency anemia, unspecified: Secondary | ICD-10-CM | POA: Diagnosis not present

## 2016-03-03 DIAGNOSIS — M25651 Stiffness of right hip, not elsewhere classified: Secondary | ICD-10-CM

## 2016-03-03 DIAGNOSIS — R296 Repeated falls: Secondary | ICD-10-CM | POA: Diagnosis not present

## 2016-03-03 DIAGNOSIS — E119 Type 2 diabetes mellitus without complications: Secondary | ICD-10-CM | POA: Diagnosis not present

## 2016-03-04 NOTE — Therapy (Signed)
[facility] [address] [place], [place], [address] Phone: [phone]   Fax:  [phone]  Physical Therapy Evaluation  Patient Details  Name: Steven Logan MRN: [id number] Date of Birth: 1970/03/17 Referring Provider: Mauricia Area, MD  Encounter Date: 03/03/2016      PT End of Session - 03/03/16 1830    Visit Number 1   Number of Visits 9   Date for PT Re-Evaluation 04/30/16   Authorization Type Medicare G-code & progress note every 10 visits   PT Start Time 1228   PT Stop Time 1318   PT Time Calculation (min) 50 min   Equipment Utilized During Treatment Gait belt   Activity Tolerance Patient tolerated treatment well   Behavior During Therapy [facility] for tasks assessed/performed      Past Medical History:  Diagnosis Date  . Abscess    great toe  . Anemia   . Arthritis   . Chronic kidney disease    esrd  . Depression   . Diabetes mellitus   . Dyslipidemia   . Eczema   . Gout   . HTN (hypertension)   . Morbid obesity ([place])   . Pancreatitis   . Pneumonia    2-3 years ago  . Pulmonary embolism ([place])    3  in  lungs  at  one time...  . Renal hypertension    Hemo started  Sept 2014- MWF  . Shortness of breath    ?? chest  cold he has now.  10/8- no SOB    Past Surgical History:  Procedure Laterality Date  . AMPUTATION Right 06/12/2013   Procedure: Transtibial Amputation;  Surgeon: Newt Minion, MD;  Location: [place];  Service: Orthopedics;  Laterality: Right;  . AMPUTATION Right 06/27/2013   Procedure: AMPUTATION ABOVE KNEE;  Surgeon: Newt Minion, MD;  Location: [place];  Service: Orthopedics;  Laterality: Right;  Right Above Knee Amputation  . AV FISTULA PLACEMENT Left 04/07/2012   Procedure: ARTERIOVENOUS (AV) FISTULA CREATION;  Surgeon: Angelia Mould, MD;  Location: [place];  Service: Vascular;  Laterality: Left;  . FISTULOGRAM Left 08/07/2012   Procedure: FISTULOGRAM;  Surgeon: Angelia Mould, MD;  Location: [facility] CATH LAB;  Service: Cardiovascular;  Laterality: Left;  arm  . I&D EXTREMITY Right 06/02/2013   Procedure: IRRIGATION AND DEBRIDEMENT ARTHROSCOPIC RIGHT ANKLE;  Surgeon: Augustin Schooling, MD;  Location: [place];  Service: Orthopedics;  Laterality: Right;  . I&D EXTREMITY Right 06/04/2013   Procedure: IRRIGATION AND DEBRIDEMENT RIGHT FOOT/ANKLE;  Surgeon: Newt Minion, MD;  Location: [place];  Service: Orthopedics;  Laterality: Right;  . I&D EXTREMITY Right 06/06/2013   Procedure: IRRIGATION AND DEBRIDEMENT EXTREMITY;  Surgeon: Newt Minion, MD;  Location: [place];  Service: Orthopedics;  Laterality: Right;  . I&D EXTREMITY Right 06/24/2013   Procedure: IRRIGATION AND DEBRIDEMENT and Revsion of TRANSTIBIAL AMPUTATION;  Surgeon: Newt Minion, MD;  Location: [place];  Service: Orthopedics;  Laterality: Right;  . INSERTION OF DIALYSIS CATHETER Right 09/19/2012   Procedure: INSERTION OF DIALYSIS CATHETER;  Surgeon: Angelia Mould, MD;  Location: [place];  Service: Vascular;  Laterality: Right;  . LIGATION OF COMPETING BRANCHES OF ARTERIOVENOUS FISTULA Left 09/19/2012   Procedure: LIGATION OF COMPETING BRANCHES OF LEFT ARM ARTERIOVENOUS FISTULA; Vein angioplasty;  Surgeon: Angelia Mould, MD;  Location: [place];  Service: Vascular;  Laterality: Left;  . none      There were no  vitals filed for this visit.       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 He has a microprocessor prosthesis. 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 He recieved first prosthesis 12/24/2013 and underwent PT for training initially and 9 visits from 01/02/2015 to 03/27/2015 due to severe fall. Today he presents to PT for further instruction in lifting, carrying, stepping down stairs & ramps. He fell going downstairs carrying groceries and chipped tooth.     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    Currently in Pain? No/denies            [facility] PT Assessment - 03/03/16 1230      Assessment   Medical Diagnosis Right Transfemoral Amputation   Referring Provider Mauricia Area, MD   Onset Date/Surgical Date 02/20/16  MD visit for PT referral   Hand Dominance Right   Prior Therapy 9 visits 01/01/2105 - 03/27/2015     Precautions   Precautions Fall   Precaution Comments NO BP LUE     Balance Screen   Has the patient fallen in the past 6 months Yes   How many times? 1   Has the patient had a decrease in activity level because of a fear of falling?  Yes   Is the patient reluctant to leave their home because of a fear of falling?  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    Leisure shopping,      Observation/Other Assessments   Focus on Therapeutic Outcomes (FOTO)  60.32 Functional Status   Activities of Balance Confidence Scale (ABC Scale)  87.5%   Fear Avoidance Belief Questionnaire (FABQ)  62 (16)     Functional Tests   Functional tests Other;Other2     Other:   Other/ Comments  Lift 15# box with poor mechanics of hip flexion; Carries with weight offset to left with balance instability.      Other:   Other/Comments Climbing A-frame ladder: supervision & verbal cues on technique for safety.      Posture/Postural Control   Posture/Postural Control Postural limitations   Postural Limitations Flexed trunk;Weight shift left;Forward head;Rounded Shoulders     ROM / Strength   AROM / PROM / Strength PROM;Strength     PROM   Overall PROM  Deficits   Overall PROM Comments Bil hamstrings & left heelcord appear tight   PROM Assessment Site Hip   Right/Left Hip Right     Strength   Overall Strength Within functional limits for tasks performed     Right Hip  Right Hip Extension -36  Thomas position     Transfers   Transfers Sit to Stand;Stand to [employer] to Stand 6: Modified independent (Device/Increase time);From chair/3-in-1  engages prosthesis during last 50%    Stand to Sit 5: Supervision;Without upper extremity assist;To chair/3-in-1  cues to engage prosthetic hydraulics     Ambulation/Gait   Ambulation/Gait Yes   Ambulation/Gait Assistance 5: Supervision   Ambulation Distance (Feet) 500 Feet   Assistive device Prosthesis;None   Gait Pattern Step-through pattern;Decreased arm swing - right;Decreased step length - left;Decreased stance time - right;Decreased stride length;Decreased hip/knee flexion - right;Decreased weight shift to right;Right hip hike;Trunk flexed;Abducted- right;Lateral trunk lean to right   Ambulation Surface Indoor;Level   Gait velocity 3.84 ft/sec comfortable & 4.47 ft/sec fast  0.63 ft/sec difference with increased deviations   Stairs Yes   Stairs Assistance 5: Supervision   Stairs Assistance Details (indicate cue type and reason) descends using prosthetic hydraulics with cues   Stair Management Technique Two rails;Step to pattern;Forwards  descends using hydraulics   Number of Stairs 4   Ramp 5: Supervision  C-leg prosthesis    Ramp Details (indicate cue type and reason) poor knee control descending   Curb 5: Supervision  prosthesis only   Curb Details (indicate cue type and reason) weight distributed too posterior with instability     Standardized Balance Assessment   Standardized Balance Assessment Berg Balance Test;Timed Up and Go Test;Four Square Step Test;Five Times Sit to Stand;10 meter walk test   Four Square Step Test Trial One   Five times sit to stand comments  19.24  without UE   10 Meter Walk L-Test 22.12sec     Berg Balance Test   Sit to Stand Able to stand without using hands and stabilize independently   Standing Unsupported Able to stand safely 2 minutes   Sitting with Back Unsupported but Feet Supported on Floor or Stool Able to sit safely and securely 2 minutes   Stand to Sit Sits safely with minimal use of hands   Transfers Able to transfer safely, minor use of hands   Standing Unsupported with Eyes Closed Able to stand 10 seconds safely   Standing Ubsupported with Feet Together Able to place feet together independently and stand for 1 minute with supervision   From Standing, Reach Forward with Outstretched Arm Can reach confidently >25 cm (10")   From Standing Position, Pick up Object from Floor Able to pick up shoe safely and easily   From Standing Position, Turn to Look Behind Over each Shoulder Looks behind one side only/other side shows less weight shift   Turn 360 Degrees Able to turn 360 degrees safely but slowly   Standing Unsupported, Alternately Place Feet on Step/Stool Able to stand independently and safely and complete 8 steps in 20 seconds   Standing Unsupported, One Foot in Front Able to plae foot ahead of the other independently and hold 30 seconds   Standing on One Leg Able to lift leg independently and hold equal to or more than 3 seconds   Total Score 49     Four Square Step Test    Trial One  15.58     High Level Balance   High Level Balance Activites Direction  changes;Turns;Sudden stops   High Level Balance Comments improper step position & weight shift with direction changes 90* right & left and 180* CW & CCW     Functional Gait  Assessment   Gait assessed  Yes   Gait Level Surface Walks 20 ft in less than 5.5 sec, no assistive devices, good speed, no evidence for imbalance, normal gait pattern, deviates no more than 6 in outside of the 12 in walkway width.   Change in Gait Speed Able to change speed, demonstrates mild gait deviations, deviates 6-10 in outside of the 12 in walkway width, or no gait deviations, unable to achieve a major change in velocity, or uses a change in velocity, or uses an assistive device.   Gait with Horizontal Head Turns Performs head turns smoothly with slight change in gait velocity (eg, minor disruption to smooth gait path), deviates 6-10 in outside 12 in walkway width, or uses an assistive device.   Gait with Vertical Head Turns Performs task with slight change in gait velocity (eg, minor disruption to smooth gait path), deviates 6 - 10 in outside 12 in walkway width or uses assistive device   Gait and Pivot Turn Pivot turns safely in greater than 3 sec and stops with no loss of balance, or pivot turns safely within 3 sec and stops with mild imbalance, requires small steps to catch balance.   Step Over Obstacle Is able to step over one shoe box (4.5 in total height) without changing gait speed. No evidence of imbalance.   Gait with Narrow Base of Support Ambulates 4-7 steps.   Gait with Eyes Closed Walks 20 ft, uses assistive device, slower speed, mild gait deviations, deviates 6-10 in outside 12 in walkway width. Ambulates 20 ft in less than 9 sec but greater than 7 sec.   Ambulating Backwards Walks 20 ft, uses assistive device, slower speed, mild gait deviations, deviates 6-10 in outside 12 in walkway width.   Steps Two feet to a stair, must use rail.   Total Score 19         Prosthetics Assessment - 03/03/16 1230       Prosthetics   Prosthetic Care Independent with Skin check;Residual limb care;Care of non-amputated limb;Prosthetic cleaning;Ply sock cleaning;Correct ply sock adjustment;Proper wear schedule/adjustment;Proper weight-bearing schedule/adjustment   Donning prosthesis  Independent   Doffing prosthesis  Independent   Current prosthetic wear tolerance (days/week)  7 days /wk   Current prosthetic wear tolerance (#hours/day)  all awake hours   K code/activity level with prosthetic use  K3 full community with variable cadence                            PT Short Term Goals - 03/03/16 1809      PT SHORT TERM GOAL #1   Title Patient verbalizes & demonstrates understanding of initial HEP. (Target Date: 04/02/2016)   Time 4   Period Weeks   Status New     PT SHORT TERM GOAL #2   Title Patient ambulates with head turns to scan environment maintaining path & pace with cues.  (Target Date: 04/02/2016)   Time 4   Period Weeks   Status New     PT SHORT TERM GOAL #3   Title Four-Square step test <14 sec to indicate lower fall risk. (Target Date: 04/02/2016)   Time 4   Period Weeks   Status New     PT SHORT TERM GOAL #4   Title Patient demonstrates proper step position & weight shift with direction changes with verbal cues only from PT. (Target Date: 04/02/2016)   Time 4   Period Weeks   Status New  PT SHORT TERM GOAL #5   Title Patient descends stairs with 2 rails reciprocally with prosthetic knee control. (Target Date: 04/02/2016)   Time 4   Period Weeks   Status New           PT Long Term Goals - March 11, 2016 1814      PT LONG TERM GOAL #1   Title Patient ambulates at comfortable gait velocity >4.0 ft/sec and increases to fast pace >5.0 ft/sec safely. (Target Date: 04/30/2016)     PT LONG TERM GOAL #2   Title Patient negotiates ramps & curbs with prosthesis only and descend stairs with 1 rail modified independent.   (Target Date: 04/30/2016)   Time 8   Period Weeks    Status New     PT LONG TERM GOAL #3   Title Berg Balance >52/56 to decrease fall risk  (Target Date: 04/30/2016)   Time 8   Period Weeks   Status New     PT LONG TERM GOAL #4   Title Functional Gait Assessment >25/30 to decrease fall risk.  (Target Date: 04/30/2016)   Time 8   Period Weeks   Status New     PT LONG TERM GOAL #5   Title Patient lifts & carries 25# 50', carries grocery bag on stairs with 1 rail and performs work simulation tasks safely, independently. (Target Date: 04/30/2016)   Time 8   Period Weeks   Status New               Plan - 2016/03/11 1840    Clinical Impression Statement This 46yo male with history of right Transfemoral Amputation had recent fall that resulted in chipped tooth. He reports fear of activities is beginning to limit what he can & will do. PT testing indicates fall risk with task that limit his ability to fully focus on prosthesis and movement patterns. When he increases gait velocity, his deviations increase making fall risk higher and he only increases by 0.63 ft/sec. He has difficulty changing directions with fall risk noted by Four-Square 15.58sec and L-Test of 22.12sec. Berg Balance [phone] and Functional Gait Assessment of 19/30 indicate fall risk. Patient has unsafe, improper mechanics with lifting & carrying items. He has prosthetic knee instability with ramps, curbs & stairs. Patient's condition is stable & plan of care is moderate complexity. Patient would benefit from skilled PT.    Rehab Potential Good   PT Frequency 1x / week   PT Duration 8 weeks   PT Treatment/Interventions ADLs/Self Care Home Management;Gait training;Stair training;Functional mobility training;Therapeutic activities;Therapeutic exercise;Balance training;Neuromuscular re-education;Patient/family education;Prosthetic Training   PT Next Visit Plan HEP for flexibility especially hip flexors. Instruct in prosthetic knee control with sit to/from stand and stairs.    Consulted  and Agree with Plan of Care Patient      Patient will benefit from skilled therapeutic intervention in order to improve the following deficits and impairments:  Abnormal gait, Decreased activity tolerance, Decreased balance, Decreased mobility, Decreased safety awareness, Postural dysfunction, Prosthetic Dependency, Decreased range of motion  Visit Diagnosis: Other abnormalities of gait and mobility  Stiffness of right hip, not elsewhere classified  Repeated falls  Unsteadiness on feet      G-Codes - Mar 11, 2016 1820    Functional Assessment Tool Used Berg Balance 49/56, Functional Gait Assessment 19/30   Functional Limitation Mobility: Walking and moving around   Mobility: Walking and Moving Around Current Status ([id number]) At least 40 percent but less than 60 percent impaired, limited or restricted  Mobility: Walking and Moving Around Goal Status [phone]) At least 1 percent but less than 20 percent impaired, limited or restricted       Problem List Patient Active Problem List   Diagnosis Date Noted  . Acute blood loss anemia 06/25/2013  . Hyperglycemia 06/07/2013  . Arm DVT (deep venous thromboembolism), acute ([place]) 06/07/2013  . ESRD on dialysis ([place]) 06/05/2013  . Encephalopathy, toxic 06/03/2013  . Cellulitis and abscess of leg 06/03/2013  . Anemia in chronic kidney disease 06/03/2013  . Arm edema 06/03/2013  . Leg abscess 06/03/2013  . Severe sepsis ([place]) 06/03/2013  . Septic shock ([place]) 06/03/2013  . Septic arthritis of ankle or foot, right 06/03/2013  . Abscess of right lower leg 06/02/2013  . Wrist lump 07/05/2012  . Swelling of limb-Left index and middle fingers 07/05/2012  . Aftercare following surgery of the circulatory system, [place] 07/05/2012  . Tingling-left wrist and some pain. 07/05/2012  . Pulmonary edema 05/04/2012  . Acute respiratory failure ([place]) 05/04/2012  . CKD (chronic kidney disease) stage 5, GFR less than 15 ml/min ([facility]) 05/04/2012  . Hyperkalemia  05/04/2012  . Pre-operative cardiovascular examination 03/15/2012  . Iron deficiency anemia 09/22/2010  . HTN (hypertension) 09/22/2010  . Hyperlipidemia 09/22/2010  . Gout 09/22/2010  . CRI (chronic renal insufficiency) 09/22/2010  . Pulmonary embolism, bilateral ([place]) 09/22/2010    Khandi Kernes PT, DPT 03/04/2016, 12:21 PM  [facility] [address] [place], [place], [address] Phone: [phone]   Fax:  [phone]  Name: Steven Logan MRN: [id number] Date of Birth: July 06, 1970

## 2016-03-05 DIAGNOSIS — N2581 Secondary hyperparathyroidism of renal origin: Secondary | ICD-10-CM | POA: Diagnosis not present

## 2016-03-05 DIAGNOSIS — E119 Type 2 diabetes mellitus without complications: Secondary | ICD-10-CM | POA: Diagnosis not present

## 2016-03-05 DIAGNOSIS — D509 Iron deficiency anemia, unspecified: Secondary | ICD-10-CM | POA: Diagnosis not present

## 2016-03-05 DIAGNOSIS — N186 End stage renal disease: Secondary | ICD-10-CM | POA: Diagnosis not present

## 2016-03-08 DIAGNOSIS — D509 Iron deficiency anemia, unspecified: Secondary | ICD-10-CM | POA: Diagnosis not present

## 2016-03-08 DIAGNOSIS — E119 Type 2 diabetes mellitus without complications: Secondary | ICD-10-CM | POA: Diagnosis not present

## 2016-03-08 DIAGNOSIS — N186 End stage renal disease: Secondary | ICD-10-CM | POA: Diagnosis not present

## 2016-03-08 DIAGNOSIS — N2581 Secondary hyperparathyroidism of renal origin: Secondary | ICD-10-CM | POA: Diagnosis not present

## 2016-03-09 ENCOUNTER — Ambulatory Visit: Payer: Medicare Other | Admitting: Physical Therapy

## 2016-03-10 DIAGNOSIS — D509 Iron deficiency anemia, unspecified: Secondary | ICD-10-CM | POA: Diagnosis not present

## 2016-03-10 DIAGNOSIS — N186 End stage renal disease: Secondary | ICD-10-CM | POA: Diagnosis not present

## 2016-03-10 DIAGNOSIS — N2581 Secondary hyperparathyroidism of renal origin: Secondary | ICD-10-CM | POA: Diagnosis not present

## 2016-03-10 DIAGNOSIS — E119 Type 2 diabetes mellitus without complications: Secondary | ICD-10-CM | POA: Diagnosis not present

## 2016-03-12 DIAGNOSIS — N186 End stage renal disease: Secondary | ICD-10-CM | POA: Diagnosis not present

## 2016-03-12 DIAGNOSIS — N2581 Secondary hyperparathyroidism of renal origin: Secondary | ICD-10-CM | POA: Diagnosis not present

## 2016-03-12 DIAGNOSIS — E119 Type 2 diabetes mellitus without complications: Secondary | ICD-10-CM | POA: Diagnosis not present

## 2016-03-12 DIAGNOSIS — D509 Iron deficiency anemia, unspecified: Secondary | ICD-10-CM | POA: Diagnosis not present

## 2016-03-15 DIAGNOSIS — N2581 Secondary hyperparathyroidism of renal origin: Secondary | ICD-10-CM | POA: Diagnosis not present

## 2016-03-15 DIAGNOSIS — N186 End stage renal disease: Secondary | ICD-10-CM | POA: Diagnosis not present

## 2016-03-15 DIAGNOSIS — D509 Iron deficiency anemia, unspecified: Secondary | ICD-10-CM | POA: Diagnosis not present

## 2016-03-15 DIAGNOSIS — E119 Type 2 diabetes mellitus without complications: Secondary | ICD-10-CM | POA: Diagnosis not present

## 2016-03-16 ENCOUNTER — Ambulatory Visit: Payer: Medicare Other | Admitting: Physical Therapy

## 2016-03-17 DIAGNOSIS — E119 Type 2 diabetes mellitus without complications: Secondary | ICD-10-CM | POA: Diagnosis not present

## 2016-03-17 DIAGNOSIS — N2581 Secondary hyperparathyroidism of renal origin: Secondary | ICD-10-CM | POA: Diagnosis not present

## 2016-03-17 DIAGNOSIS — N186 End stage renal disease: Secondary | ICD-10-CM | POA: Diagnosis not present

## 2016-03-17 DIAGNOSIS — D509 Iron deficiency anemia, unspecified: Secondary | ICD-10-CM | POA: Diagnosis not present

## 2016-03-19 DIAGNOSIS — N2581 Secondary hyperparathyroidism of renal origin: Secondary | ICD-10-CM | POA: Diagnosis not present

## 2016-03-19 DIAGNOSIS — D509 Iron deficiency anemia, unspecified: Secondary | ICD-10-CM | POA: Diagnosis not present

## 2016-03-19 DIAGNOSIS — E119 Type 2 diabetes mellitus without complications: Secondary | ICD-10-CM | POA: Diagnosis not present

## 2016-03-19 DIAGNOSIS — N186 End stage renal disease: Secondary | ICD-10-CM | POA: Diagnosis not present

## 2016-03-22 DIAGNOSIS — N186 End stage renal disease: Secondary | ICD-10-CM | POA: Diagnosis not present

## 2016-03-22 DIAGNOSIS — N2581 Secondary hyperparathyroidism of renal origin: Secondary | ICD-10-CM | POA: Diagnosis not present

## 2016-03-22 DIAGNOSIS — D509 Iron deficiency anemia, unspecified: Secondary | ICD-10-CM | POA: Diagnosis not present

## 2016-03-22 DIAGNOSIS — E119 Type 2 diabetes mellitus without complications: Secondary | ICD-10-CM | POA: Diagnosis not present

## 2016-03-23 ENCOUNTER — Encounter: Payer: Self-pay | Admitting: Physical Therapy

## 2016-03-23 ENCOUNTER — Ambulatory Visit: Payer: Medicare Other | Admitting: Physical Therapy

## 2016-03-23 DIAGNOSIS — R2689 Other abnormalities of gait and mobility: Secondary | ICD-10-CM | POA: Diagnosis not present

## 2016-03-23 DIAGNOSIS — M25651 Stiffness of right hip, not elsewhere classified: Secondary | ICD-10-CM | POA: Diagnosis not present

## 2016-03-23 DIAGNOSIS — R2681 Unsteadiness on feet: Secondary | ICD-10-CM | POA: Diagnosis not present

## 2016-03-23 DIAGNOSIS — R296 Repeated falls: Secondary | ICD-10-CM

## 2016-03-23 NOTE — Patient Instructions (Signed)
HIP: Flexors - Supine    Lie on edge of surface. Place leg off the surface, allow knee to bend. Bring other knee toward chest. Hold ___ seconds. ___ reps per set, ___ sets per day, ___ days per week Rest lowered foot on stool.  Copyright  VHI. All rights reserved.  Hip Extension    Lying face down, raise leg just off floor. Keep leg straight. Hold 1 count. Lower leg to floor. Repeat ____ times each leg. Optional: Place small pillow under abdomen.  Copyright  VHI. All rights reserved.  Hip Flexor: Half Kneel    Kneel on right knee. Lean pelvis forward until stretch is felt. Hold ___ seconds. Relax. Repeat ___ times. Do ___ times a day. Repeat on other knee. Advanced: Grab back of sock and pull toward buttocks.  Copyright  VHI. All rights reserved.  HIP: Short Hip Flexors - Standing    Place hand on wall for support. Place one leg in front of other. Bend both knees. Lift chest and rotate pelvis forward. Hold ___ seconds. ___ reps per set, ___ sets per day, ___ days per week  Copyright  VHI. All rights reserved.

## 2016-03-24 DIAGNOSIS — I120 Hypertensive chronic kidney disease with stage 5 chronic kidney disease or end stage renal disease: Secondary | ICD-10-CM | POA: Diagnosis not present

## 2016-03-24 DIAGNOSIS — D509 Iron deficiency anemia, unspecified: Secondary | ICD-10-CM | POA: Diagnosis not present

## 2016-03-24 DIAGNOSIS — E119 Type 2 diabetes mellitus without complications: Secondary | ICD-10-CM | POA: Diagnosis not present

## 2016-03-24 DIAGNOSIS — N186 End stage renal disease: Secondary | ICD-10-CM | POA: Diagnosis not present

## 2016-03-24 DIAGNOSIS — Z992 Dependence on renal dialysis: Secondary | ICD-10-CM | POA: Diagnosis not present

## 2016-03-24 DIAGNOSIS — N2581 Secondary hyperparathyroidism of renal origin: Secondary | ICD-10-CM | POA: Diagnosis not present

## 2016-03-24 NOTE — Therapy (Signed)
[place] [address] [place], [place], [address] Phone: [phone]   Fax:  [phone]  Physical Therapy Treatment  Patient Details  Name: Steven Logan MRN: [id number] Date of Birth: 1970-10-16 Referring Provider: Mauricia Area, MD  Encounter Date: 03/23/2016      PT End of Session - 03/23/16 1942    Visit Number 2   Number of Visits 9   Date for PT Re-Evaluation 04/30/16   Authorization Type Medicare G-code & progress note every 10 visits   PT Start Time 1230   PT Stop Time 1315   PT Time Calculation (min) 45 min   Equipment Utilized During Treatment Gait belt   Activity Tolerance Patient tolerated treatment well   Behavior During Therapy [facility] for tasks assessed/performed      Past Medical History:  Diagnosis Date  . Abscess    great toe  . Anemia   . Arthritis   . Chronic kidney disease    esrd  . Depression   . Diabetes mellitus   . Dyslipidemia   . Eczema   . Gout   . HTN (hypertension)   . Morbid obesity ([place])   . Pancreatitis   . Pneumonia    2-3 years ago  . Pulmonary embolism ([place])    3  in  lungs  at  one time...  . Renal hypertension    Hemo started  Sept 2014- MWF  . Shortness of breath    ?? chest  cold he has now.  10/8- no SOB    Past Surgical History:  Procedure Laterality Date  . AMPUTATION Right 06/12/2013   Procedure: Transtibial Amputation;  Surgeon: Newt Minion, MD;  Location: [place];  Service: Orthopedics;  Laterality: Right;  . AMPUTATION Right 06/27/2013   Procedure: AMPUTATION ABOVE KNEE;  Surgeon: Newt Minion, MD;  Location: [place];  Service: Orthopedics;  Laterality: Right;  Right Above Knee Amputation  . AV FISTULA PLACEMENT Left 04/07/2012   Procedure: ARTERIOVENOUS (AV) FISTULA CREATION;  Surgeon: Angelia Mould, MD;  Location: [place];  Service: Vascular;  Laterality: Left;  . FISTULOGRAM Left 08/07/2012   Procedure: FISTULOGRAM;  Surgeon: Angelia Mould, MD;  Location: [facility] CATH LAB;  Service: Cardiovascular;  Laterality: Left;  arm  . I&D EXTREMITY Right 06/02/2013   Procedure: IRRIGATION AND DEBRIDEMENT ARTHROSCOPIC RIGHT ANKLE;  Surgeon: Augustin Schooling, MD;  Location: [place];  Service: Orthopedics;  Laterality: Right;  . I&D EXTREMITY Right 06/04/2013   Procedure: IRRIGATION AND DEBRIDEMENT RIGHT FOOT/ANKLE;  Surgeon: Newt Minion, MD;  Location: [place];  Service: Orthopedics;  Laterality: Right;  . I&D EXTREMITY Right 06/06/2013   Procedure: IRRIGATION AND DEBRIDEMENT EXTREMITY;  Surgeon: Newt Minion, MD;  Location: [place];  Service: Orthopedics;  Laterality: Right;  . I&D EXTREMITY Right 06/24/2013   Procedure: IRRIGATION AND DEBRIDEMENT and Revsion of TRANSTIBIAL AMPUTATION;  Surgeon: Newt Minion, MD;  Location: [place];  Service: Orthopedics;  Laterality: Right;  . INSERTION OF DIALYSIS CATHETER Right 09/19/2012   Procedure: INSERTION OF DIALYSIS CATHETER;  Surgeon: Angelia Mould, MD;  Location: [place];  Service: Vascular;  Laterality: Right;  . LIGATION OF COMPETING BRANCHES OF ARTERIOVENOUS FISTULA Left 09/19/2012   Procedure: LIGATION OF COMPETING BRANCHES OF LEFT ARM ARTERIOVENOUS FISTULA; Vein angioplasty;  Surgeon: Angelia Mould, MD;  Location: [place];  Service: Vascular;  Laterality: Left;  . none      There were no  vitals filed for this visit.      Subjective Assessment - 03/23/16 1233    Subjective He had his lower teeth removed on 7/82/95 with no complications but cannot chew yet.    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    Currently in Pain? Yes   Pain Score 6    Pain Location Jaw   Pain Orientation Left   Pain Descriptors / Indicators Sore   Pain Onset In the past 7 days   Aggravating Factors  had oral surgery 5  days ago   Pain Relieving Factors medications      Therapeutic exercise: see pt instruction  Hip flexor stretches supine off edge of couch or bed, half-kneeling and standing. Prone hip extension.   Prosthetic Training with microprocessor Transfemoral Amputation prosthesis: Stairs with 2 rails descend using prosthetic knee hydraulics step-to pattern with verbal, demo & tactile cues on technique. Standing at counter RUE support & chair back LUE support using mirror & marks on floor for visual cues, prosthesis in terminal stance position: weight shift & pelvic motion to unlock prosthetic knee loading hydraulics. Progressed to advancing prosthesis. PT videotaped using pt's phone for repeating at home.                            PT Education - 03/23/16 1230    Education provided Yes   Education Details hip flexor stretches   Person(s) Educated Patient   Methods Explanation;Demonstration;Tactile cues;Verbal cues;Handout   Comprehension Verbalized understanding;Returned demonstration;Verbal cues required;Tactile cues required          PT Short Term Goals - 03/23/16 1942      PT SHORT TERM GOAL #1   Title Patient verbalizes & demonstrates understanding of initial HEP. (Target Date: 04/02/2016)   Time 4   Period Weeks   Status On-going     PT SHORT TERM GOAL #2   Title Patient ambulates with head turns to scan environment maintaining path & pace with cues.  (Target Date: 04/02/2016)   Time 4   Period Weeks   Status On-going     PT SHORT TERM GOAL #3   Title Four-Square step test <14 sec to indicate lower fall risk. (Target Date: 04/02/2016)   Time 4   Period Weeks   Status On-going     PT SHORT TERM GOAL #4   Title Patient demonstrates proper step position & weight shift with direction changes with verbal cues only from PT. (Target Date: 04/02/2016)   Time 4   Period Weeks   Status On-going     PT SHORT TERM GOAL #5   Title Patient descends stairs with 2 rails  reciprocally with prosthetic knee control. (Target Date: 04/02/2016)   Time 4   Period Weeks   Status On-going           PT Long Term Goals - 03/23/16 1943      PT LONG TERM GOAL #1   Title Patient ambulates at comfortable gait velocity >4.0 ft/sec and increases to fast pace >5.0 ft/sec safely. (Target Date: 04/30/2016)   Status On-going     PT LONG TERM GOAL #2   Title Patient negotiates ramps & curbs with prosthesis only and descend stairs with 1 rail modified independent.   (Target Date: 04/30/2016)   Time 8  Period Weeks   Status On-going     PT LONG TERM GOAL #3   Title Berg Balance >52/56 to decrease fall risk  (Target Date: 04/30/2016)   Time 8   Period Weeks   Status On-going     PT LONG TERM GOAL #4   Title Functional Gait Assessment >25/30 to decrease fall risk.  (Target Date: 04/30/2016)   Time 8   Period Weeks   Status On-going     PT LONG TERM GOAL #5   Title Patient lifts & carries 25# 50', carries grocery bag on stairs with 1 rail and performs work simulation tasks safely, independently. (Target Date: 04/30/2016)   Time 8   Period Weeks   Status On-going               Plan - 03/23/16 1943    Clinical Impression Statement Patient appears to understand hip flexor stretches & rationale / need. Pt improved prosthetic knee control descending steps with step-to pattern. Patient improved terminal stance knee flexion with slow controled exercise at counter.    Rehab Potential Good   PT Frequency 1x / week   PT Duration 8 weeks   PT Treatment/Interventions ADLs/Self Care Home Management;Gait training;Stair training;Functional mobility training;Therapeutic activities;Therapeutic exercise;Balance training;Neuromuscular re-education;Patient/family education;Prosthetic Training   PT Next Visit Plan check hip flexor stretch & terminal stance knee flexion exercises. progress stairs to reciprocal with 2 rails. 4-square step exercise & walk in halls with head turns.     Consulted and Agree with Plan of Care Patient      Patient will benefit from skilled therapeutic intervention in order to improve the following deficits and impairments:  Abnormal gait, Decreased activity tolerance, Decreased balance, Decreased mobility, Decreased safety awareness, Postural dysfunction, Prosthetic Dependency, Decreased range of motion  Visit Diagnosis: Other abnormalities of gait and mobility  Stiffness of right hip, not elsewhere classified  Repeated falls  Unsteadiness on feet     Problem List Patient Active Problem List   Diagnosis Date Noted  . Acute blood loss anemia 06/25/2013  . Hyperglycemia 06/07/2013  . Arm DVT (deep venous thromboembolism), acute ([place]) 06/07/2013  . ESRD on dialysis ([place]) 06/05/2013  . Encephalopathy, toxic 06/03/2013  . Cellulitis and abscess of leg 06/03/2013  . Anemia in chronic kidney disease 06/03/2013  . Arm edema 06/03/2013  . Leg abscess 06/03/2013  . Severe sepsis ([place]) 06/03/2013  . Septic shock ([place]) 06/03/2013  . Septic arthritis of ankle or foot, right 06/03/2013  . Abscess of right lower leg 06/02/2013  . Wrist lump 07/05/2012  . Swelling of limb-Left index and middle fingers 07/05/2012  . Aftercare following surgery of the circulatory system, [facility] 07/05/2012  . Tingling-left wrist and some pain. 07/05/2012  . Pulmonary edema 05/04/2012  . Acute respiratory failure ([place]) 05/04/2012  . CKD (chronic kidney disease) stage 5, GFR less than 15 ml/min (HCC) 05/04/2012  . Hyperkalemia 05/04/2012  . Pre-operative cardiovascular examination 03/15/2012  . Iron deficiency anemia 09/22/2010  . HTN (hypertension) 09/22/2010  . Hyperlipidemia 09/22/2010  . Gout 09/22/2010  . CRI (chronic renal insufficiency) 09/22/2010  . Pulmonary embolism, bilateral ([place]) 09/22/2010    Sylvie Mifsud PT, DPT 03/24/2016, 7:47 PM  [place] [address] [place], [place],  [address] Phone: [phone]   Fax:  [phone]  Name: Steven Logan MRN: [id number] Date of Birth: May 27, 1970

## 2016-03-26 DIAGNOSIS — D509 Iron deficiency anemia, unspecified: Secondary | ICD-10-CM | POA: Diagnosis not present

## 2016-03-26 DIAGNOSIS — N186 End stage renal disease: Secondary | ICD-10-CM | POA: Diagnosis not present

## 2016-03-26 DIAGNOSIS — E119 Type 2 diabetes mellitus without complications: Secondary | ICD-10-CM | POA: Diagnosis not present

## 2016-03-26 DIAGNOSIS — D631 Anemia in chronic kidney disease: Secondary | ICD-10-CM | POA: Diagnosis not present

## 2016-03-26 DIAGNOSIS — N2581 Secondary hyperparathyroidism of renal origin: Secondary | ICD-10-CM | POA: Diagnosis not present

## 2016-03-29 DIAGNOSIS — N186 End stage renal disease: Secondary | ICD-10-CM | POA: Diagnosis not present

## 2016-03-29 DIAGNOSIS — N2581 Secondary hyperparathyroidism of renal origin: Secondary | ICD-10-CM | POA: Diagnosis not present

## 2016-03-29 DIAGNOSIS — D509 Iron deficiency anemia, unspecified: Secondary | ICD-10-CM | POA: Diagnosis not present

## 2016-03-29 DIAGNOSIS — E119 Type 2 diabetes mellitus without complications: Secondary | ICD-10-CM | POA: Diagnosis not present

## 2016-03-29 DIAGNOSIS — D631 Anemia in chronic kidney disease: Secondary | ICD-10-CM | POA: Diagnosis not present

## 2016-03-30 ENCOUNTER — Ambulatory Visit: Payer: Medicare Other | Admitting: Physical Therapy

## 2016-03-31 DIAGNOSIS — N2581 Secondary hyperparathyroidism of renal origin: Secondary | ICD-10-CM | POA: Diagnosis not present

## 2016-03-31 DIAGNOSIS — E119 Type 2 diabetes mellitus without complications: Secondary | ICD-10-CM | POA: Diagnosis not present

## 2016-03-31 DIAGNOSIS — N186 End stage renal disease: Secondary | ICD-10-CM | POA: Diagnosis not present

## 2016-03-31 DIAGNOSIS — D631 Anemia in chronic kidney disease: Secondary | ICD-10-CM | POA: Diagnosis not present

## 2016-03-31 DIAGNOSIS — D509 Iron deficiency anemia, unspecified: Secondary | ICD-10-CM | POA: Diagnosis not present

## 2016-04-02 DIAGNOSIS — N2581 Secondary hyperparathyroidism of renal origin: Secondary | ICD-10-CM | POA: Diagnosis not present

## 2016-04-02 DIAGNOSIS — D509 Iron deficiency anemia, unspecified: Secondary | ICD-10-CM | POA: Diagnosis not present

## 2016-04-02 DIAGNOSIS — E119 Type 2 diabetes mellitus without complications: Secondary | ICD-10-CM | POA: Diagnosis not present

## 2016-04-02 DIAGNOSIS — N186 End stage renal disease: Secondary | ICD-10-CM | POA: Diagnosis not present

## 2016-04-02 DIAGNOSIS — D631 Anemia in chronic kidney disease: Secondary | ICD-10-CM | POA: Diagnosis not present

## 2016-04-05 DIAGNOSIS — N186 End stage renal disease: Secondary | ICD-10-CM | POA: Diagnosis not present

## 2016-04-05 DIAGNOSIS — E119 Type 2 diabetes mellitus without complications: Secondary | ICD-10-CM | POA: Diagnosis not present

## 2016-04-05 DIAGNOSIS — N2581 Secondary hyperparathyroidism of renal origin: Secondary | ICD-10-CM | POA: Diagnosis not present

## 2016-04-05 DIAGNOSIS — D509 Iron deficiency anemia, unspecified: Secondary | ICD-10-CM | POA: Diagnosis not present

## 2016-04-05 DIAGNOSIS — D631 Anemia in chronic kidney disease: Secondary | ICD-10-CM | POA: Diagnosis not present

## 2016-04-06 ENCOUNTER — Ambulatory Visit: Payer: Medicare Other | Admitting: Physical Therapy

## 2016-04-07 DIAGNOSIS — N2581 Secondary hyperparathyroidism of renal origin: Secondary | ICD-10-CM | POA: Diagnosis not present

## 2016-04-07 DIAGNOSIS — D509 Iron deficiency anemia, unspecified: Secondary | ICD-10-CM | POA: Diagnosis not present

## 2016-04-07 DIAGNOSIS — D631 Anemia in chronic kidney disease: Secondary | ICD-10-CM | POA: Diagnosis not present

## 2016-04-07 DIAGNOSIS — N186 End stage renal disease: Secondary | ICD-10-CM | POA: Diagnosis not present

## 2016-04-07 DIAGNOSIS — E119 Type 2 diabetes mellitus without complications: Secondary | ICD-10-CM | POA: Diagnosis not present

## 2016-04-09 DIAGNOSIS — D631 Anemia in chronic kidney disease: Secondary | ICD-10-CM | POA: Diagnosis not present

## 2016-04-09 DIAGNOSIS — N2581 Secondary hyperparathyroidism of renal origin: Secondary | ICD-10-CM | POA: Diagnosis not present

## 2016-04-09 DIAGNOSIS — N186 End stage renal disease: Secondary | ICD-10-CM | POA: Diagnosis not present

## 2016-04-09 DIAGNOSIS — D509 Iron deficiency anemia, unspecified: Secondary | ICD-10-CM | POA: Diagnosis not present

## 2016-04-09 DIAGNOSIS — E119 Type 2 diabetes mellitus without complications: Secondary | ICD-10-CM | POA: Diagnosis not present

## 2016-04-12 DIAGNOSIS — E119 Type 2 diabetes mellitus without complications: Secondary | ICD-10-CM | POA: Diagnosis not present

## 2016-04-12 DIAGNOSIS — N2581 Secondary hyperparathyroidism of renal origin: Secondary | ICD-10-CM | POA: Diagnosis not present

## 2016-04-12 DIAGNOSIS — N186 End stage renal disease: Secondary | ICD-10-CM | POA: Diagnosis not present

## 2016-04-12 DIAGNOSIS — D509 Iron deficiency anemia, unspecified: Secondary | ICD-10-CM | POA: Diagnosis not present

## 2016-04-12 DIAGNOSIS — D631 Anemia in chronic kidney disease: Secondary | ICD-10-CM | POA: Diagnosis not present

## 2016-04-13 ENCOUNTER — Ambulatory Visit: Payer: Medicare Other | Attending: Nephrology | Admitting: Physical Therapy

## 2016-04-13 ENCOUNTER — Encounter: Payer: Self-pay | Admitting: Physical Therapy

## 2016-04-13 DIAGNOSIS — R2681 Unsteadiness on feet: Secondary | ICD-10-CM | POA: Insufficient documentation

## 2016-04-13 DIAGNOSIS — R2689 Other abnormalities of gait and mobility: Secondary | ICD-10-CM | POA: Diagnosis not present

## 2016-04-13 DIAGNOSIS — M25651 Stiffness of right hip, not elsewhere classified: Secondary | ICD-10-CM | POA: Insufficient documentation

## 2016-04-13 NOTE — Patient Instructions (Addendum)
Four Square Stepping    Stand in right front square of four square pattern. Move clockwise, stepping from one square to next. Keep facing forward. Repeat ____ times per session. Do ____ sessions per day.  Copyright  VHI. All rights reserved.  Tai Chi Stance - Modified    Start in Horse Stance, one step length behind chair, palms rest on chair back. Shift weight onto one foot. Turn pelvis, torso, other leg outward 20-45 pivoting on heel. Shift weight 100% onto diagonal foot. Step directly forward with unweighted foot. Shift weight 70% onto front foot, trunk facing forward. Repeat forward and backward weight shift ___ times each side.  Copyright  VHI. All rights reserved.  Tai Chi Stance - Modified    Start in Horse Stance, one step length behind chair, palms rest on chair back. Shift weight onto one foot. Turn pelvis, torso, other leg outward 20-45 pivoting on heel. Shift weight 100% onto diagonal foot. Step directly forward with unweighted foot. Shift weight 70% onto front foot, trunk facing forward. Repeat ___ times each side.  Copyright  VHI. All rights reserved.  Tai Chi [employer]    Start in [place], left leg forward. Shift weight 100% onto right foot, while turning pelvis and torso same direction. Simultaneously right arm swings back and left arm swings in circular motion in front of body. Return to start position while, right arm swings forward, left arm back. Repeat ___ times each side.  Copyright  VHI. All rights reserved.

## 2016-04-14 DIAGNOSIS — D631 Anemia in chronic kidney disease: Secondary | ICD-10-CM | POA: Diagnosis not present

## 2016-04-14 DIAGNOSIS — N2581 Secondary hyperparathyroidism of renal origin: Secondary | ICD-10-CM | POA: Diagnosis not present

## 2016-04-14 DIAGNOSIS — E119 Type 2 diabetes mellitus without complications: Secondary | ICD-10-CM | POA: Diagnosis not present

## 2016-04-14 DIAGNOSIS — N186 End stage renal disease: Secondary | ICD-10-CM | POA: Diagnosis not present

## 2016-04-14 DIAGNOSIS — D509 Iron deficiency anemia, unspecified: Secondary | ICD-10-CM | POA: Diagnosis not present

## 2016-04-14 NOTE — Therapy (Signed)
[place] [address] [place], [place], [address] Phone: [phone]   Fax:  [phone]  Physical Therapy Treatment  Patient Details  Name: Steven Logan MRN: [id number] Date of Birth: Feb 06, 1970 Referring Provider: Mauricia Area, MD  Encounter Date: 04/13/2016      PT End of Session - 04/13/16 1909    Visit Number 3   Number of Visits 9   Date for PT Re-Evaluation 04/30/16   Authorization Type Medicare G-code & progress note every 10 visits   PT Start Time 1829   PT Stop Time 1529   PT Time Calculation (min) 44 min   Equipment Utilized During Treatment Gait belt   Activity Tolerance Patient tolerated treatment well   Behavior During Therapy [facility] for tasks assessed/performed      Past Medical History:  Diagnosis Date  . Abscess    great toe  . Anemia   . Arthritis   . Chronic kidney disease    esrd  . Depression   . Diabetes mellitus   . Dyslipidemia   . Eczema   . Gout   . HTN (hypertension)   . Morbid obesity ([place])   . Pancreatitis   . Pneumonia    2-3 years ago  . Pulmonary embolism ([place])    3  in  lungs  at  one time...  . Renal hypertension    Hemo started  Sept 2014- MWF  . Shortness of breath    ?? chest  cold he has now.  10/8- no SOB    Past Surgical History:  Procedure Laterality Date  . AMPUTATION Right 06/12/2013   Procedure: Transtibial Amputation;  Surgeon: Newt Minion, MD;  Location: [place];  Service: Orthopedics;  Laterality: Right;  . AMPUTATION Right 06/27/2013   Procedure: AMPUTATION ABOVE KNEE;  Surgeon: Newt Minion, MD;  Location: [place];  Service: Orthopedics;  Laterality: Right;  Right Above Knee Amputation  . AV FISTULA PLACEMENT Left 04/07/2012   Procedure: ARTERIOVENOUS (AV) FISTULA CREATION;  Surgeon: Angelia Mould, MD;  Location: [place];  Service: Vascular;  Laterality: Left;  . FISTULOGRAM Left 08/07/2012   Procedure: FISTULOGRAM;  Surgeon: Angelia Mould, MD;  Location: [facility] CATH LAB;  Service: Cardiovascular;  Laterality: Left;  arm  . I&D EXTREMITY Right 06/02/2013   Procedure: IRRIGATION AND DEBRIDEMENT ARTHROSCOPIC RIGHT ANKLE;  Surgeon: Augustin Schooling, MD;  Location: [place];  Service: Orthopedics;  Laterality: Right;  . I&D EXTREMITY Right 06/04/2013   Procedure: IRRIGATION AND DEBRIDEMENT RIGHT FOOT/ANKLE;  Surgeon: Newt Minion, MD;  Location: [place];  Service: Orthopedics;  Laterality: Right;  . I&D EXTREMITY Right 06/06/2013   Procedure: IRRIGATION AND DEBRIDEMENT EXTREMITY;  Surgeon: Newt Minion, MD;  Location: [place];  Service: Orthopedics;  Laterality: Right;  . I&D EXTREMITY Right 06/24/2013   Procedure: IRRIGATION AND DEBRIDEMENT and Revsion of TRANSTIBIAL AMPUTATION;  Surgeon: Newt Minion, MD;  Location: [place];  Service: Orthopedics;  Laterality: Right;  . INSERTION OF DIALYSIS CATHETER Right 09/19/2012   Procedure: INSERTION OF DIALYSIS CATHETER;  Surgeon: Angelia Mould, MD;  Location: [place];  Service: Vascular;  Laterality: Right;  . LIGATION OF COMPETING BRANCHES OF ARTERIOVENOUS FISTULA Left 09/19/2012   Procedure: LIGATION OF COMPETING BRANCHES OF LEFT ARM ARTERIOVENOUS FISTULA; Vein angioplasty;  Surgeon: Angelia Mould, MD;  Location: [place];  Service: Vascular;  Laterality: Left;  . none      There were no  vitals filed for this visit.      Subjective Assessment - 04/13/16 1450    Subjective He has not fallen. His aunt that was close to him died and he has been depressed but is getting better.    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    Currently in Pain? No/denies     Prosthetic Training with Transfemoral microprocessor prosthesis: Stairs with 2 rails descend using knee hydraulics initially step-to progressing  to reciprocal. Progressed to left rail then right rail only step-to pattern.  Near counter for intermittent UE support: direction changes with pelvic, hip rotation using color tiles as markers for placement with cues on weight shift.   Neuromuscular Re-education:  Near counter & chair backs: four square stepping clockwise & counterclockwise with cues on weight shift.                             PT Education - 04/13/16 1908    Education provided Yes   Education Details Four square stepping & direction change side step   Person(s) Educated Patient   Methods Explanation;Demonstration;Tactile cues;Verbal cues;Handout   Comprehension Verbalized understanding;Returned demonstration;Verbal cues required;Tactile cues required;Need further instruction          PT Short Term Goals - 04/13/16 1910      PT SHORT TERM GOAL #1   Title Patient verbalizes & demonstrates understanding of initial HEP. (Target Date: 04/02/2016)   Baseline MET 04/13/2016   Time 4   Period Weeks   Status Achieved     PT SHORT TERM GOAL #2   Title Patient ambulates with head turns to scan environment maintaining path & pace with cues.  (Target Date: 04/02/2016)   Baseline NOT MET 04/13/2016 due to limited visits    Time 4   Period Weeks   Status Not Met     PT SHORT TERM GOAL #3   Title Four-Square step test <14 sec to indicate lower fall risk. (Target Date: 04/02/2016)   Baseline NOT MET 04/13/2016 due to limited visits    Time 4   Period Weeks   Status Not Met     PT SHORT TERM GOAL #4   Title Patient demonstrates proper step position & weight shift with direction changes with verbal cues only from PT. (Target Date: 04/02/2016)   Baseline NOT MET 04/13/2016 due to limited visits    Time 4   Period Weeks   Status Not Met     PT SHORT TERM GOAL #5   Title Patient descends stairs with 2 rails reciprocally with prosthetic knee control. (Target Date: 04/02/2016)   Baseline MET 04/13/2016   Time 4    Period Weeks   Status Achieved           PT Long Term Goals - 03/23/16 1943      PT LONG TERM GOAL #1   Title Patient ambulates at comfortable gait velocity >4.0 ft/sec and increases to fast pace >5.0 ft/sec safely. (Target Date: 04/30/2016)   Status On-going     PT LONG TERM GOAL #2   Title Patient negotiates ramps & curbs with prosthesis only and descend stairs with 1 rail modified independent.   (Target Date: 04/30/2016)   Time 8   Period Weeks   Status On-going     PT LONG TERM  GOAL #3   Title Berg Balance >52/56 to decrease fall risk  (Target Date: 04/30/2016)   Time 8   Period Weeks   Status On-going     PT LONG TERM GOAL #4   Title Functional Gait Assessment >25/30 to decrease fall risk.  (Target Date: 04/30/2016)   Time 8   Period Weeks   Status On-going     PT LONG TERM GOAL #5   Title Patient lifts & carries 25# 50', carries grocery bag on stairs with 1 rail and performs work simulation tasks safely, independently. (Target Date: 04/30/2016)   Time 8   Period Weeks   Status On-going               Plan - 04/13/16 1913    Clinical Impression Statement Patient made limited progress due to only 2 visits since evaluation. Patient will probably need recertification at end of this certification period to meet level of function Patient improved negotiating stairs with instruction & repetition. Patient improved direction changes.    Rehab Potential Good   PT Frequency 1x / week   PT Duration 8 weeks   PT Treatment/Interventions ADLs/Self Care Home Management;Gait training;Stair training;Functional mobility training;Therapeutic activities;Therapeutic exercise;Balance training;Neuromuscular re-education;Patient/family education;Prosthetic Training   PT Next Visit Plan walk in halls with head turns.    Consulted and Agree with Plan of Care Patient      Patient will benefit from skilled therapeutic intervention in order to improve the following deficits and impairments:   Abnormal gait, Decreased activity tolerance, Decreased balance, Decreased mobility, Decreased safety awareness, Postural dysfunction, Prosthetic Dependency, Decreased range of motion  Visit Diagnosis: Other abnormalities of gait and mobility  Stiffness of right hip, not elsewhere classified  Unsteadiness on feet     Problem List Patient Active Problem List   Diagnosis Date Noted  . Acute blood loss anemia 06/25/2013  . Hyperglycemia 06/07/2013  . Arm DVT (deep venous thromboembolism), acute ([place]) 06/07/2013  . ESRD on dialysis ([place]) 06/05/2013  . Encephalopathy, toxic 06/03/2013  . Cellulitis and abscess of leg 06/03/2013  . Anemia in chronic kidney disease 06/03/2013  . Arm edema 06/03/2013  . Leg abscess 06/03/2013  . Severe sepsis ([place]) 06/03/2013  . Septic shock ([place]) 06/03/2013  . Septic arthritis of ankle or foot, right 06/03/2013  . Abscess of right lower leg 06/02/2013  . Wrist lump 07/05/2012  . Swelling of limb-Left index and middle fingers 07/05/2012  . Aftercare following surgery of the circulatory system, [place] 07/05/2012  . Tingling-left wrist and some pain. 07/05/2012  . Pulmonary edema 05/04/2012  . Acute respiratory failure ([place]) 05/04/2012  . CKD (chronic kidney disease) stage 5, GFR less than 15 ml/min (HCC) 05/04/2012  . Hyperkalemia 05/04/2012  . Pre-operative cardiovascular examination 03/15/2012  . Iron deficiency anemia 09/22/2010  . HTN (hypertension) 09/22/2010  . Hyperlipidemia 09/22/2010  . Gout 09/22/2010  . CRI (chronic renal insufficiency) 09/22/2010  . Pulmonary embolism, bilateral ([place]) 09/22/2010    Jyden Kromer PT, DPT 04/14/2016, 7:18 PM  [facility] [address] [place], [place], [address] Phone: [phone]   Fax:  [phone]  Name: ROBLEY MATASSA MRN: [id number] Date of Birth: November 17, 1970

## 2016-04-16 DIAGNOSIS — E119 Type 2 diabetes mellitus without complications: Secondary | ICD-10-CM | POA: Diagnosis not present

## 2016-04-16 DIAGNOSIS — N2581 Secondary hyperparathyroidism of renal origin: Secondary | ICD-10-CM | POA: Diagnosis not present

## 2016-04-16 DIAGNOSIS — D631 Anemia in chronic kidney disease: Secondary | ICD-10-CM | POA: Diagnosis not present

## 2016-04-16 DIAGNOSIS — D509 Iron deficiency anemia, unspecified: Secondary | ICD-10-CM | POA: Diagnosis not present

## 2016-04-16 DIAGNOSIS — N186 End stage renal disease: Secondary | ICD-10-CM | POA: Diagnosis not present

## 2016-04-19 DIAGNOSIS — D631 Anemia in chronic kidney disease: Secondary | ICD-10-CM | POA: Diagnosis not present

## 2016-04-19 DIAGNOSIS — E119 Type 2 diabetes mellitus without complications: Secondary | ICD-10-CM | POA: Diagnosis not present

## 2016-04-19 DIAGNOSIS — N186 End stage renal disease: Secondary | ICD-10-CM | POA: Diagnosis not present

## 2016-04-19 DIAGNOSIS — N2581 Secondary hyperparathyroidism of renal origin: Secondary | ICD-10-CM | POA: Diagnosis not present

## 2016-04-19 DIAGNOSIS — D509 Iron deficiency anemia, unspecified: Secondary | ICD-10-CM | POA: Diagnosis not present

## 2016-04-20 ENCOUNTER — Ambulatory Visit: Payer: Medicare Other | Admitting: Physical Therapy

## 2016-04-21 DIAGNOSIS — N2581 Secondary hyperparathyroidism of renal origin: Secondary | ICD-10-CM | POA: Diagnosis not present

## 2016-04-21 DIAGNOSIS — D631 Anemia in chronic kidney disease: Secondary | ICD-10-CM | POA: Diagnosis not present

## 2016-04-21 DIAGNOSIS — D509 Iron deficiency anemia, unspecified: Secondary | ICD-10-CM | POA: Diagnosis not present

## 2016-04-21 DIAGNOSIS — E119 Type 2 diabetes mellitus without complications: Secondary | ICD-10-CM | POA: Diagnosis not present

## 2016-04-21 DIAGNOSIS — N186 End stage renal disease: Secondary | ICD-10-CM | POA: Diagnosis not present

## 2016-04-23 DIAGNOSIS — N186 End stage renal disease: Secondary | ICD-10-CM | POA: Diagnosis not present

## 2016-04-23 DIAGNOSIS — D631 Anemia in chronic kidney disease: Secondary | ICD-10-CM | POA: Diagnosis not present

## 2016-04-23 DIAGNOSIS — N2581 Secondary hyperparathyroidism of renal origin: Secondary | ICD-10-CM | POA: Diagnosis not present

## 2016-04-23 DIAGNOSIS — D509 Iron deficiency anemia, unspecified: Secondary | ICD-10-CM | POA: Diagnosis not present

## 2016-04-23 DIAGNOSIS — E119 Type 2 diabetes mellitus without complications: Secondary | ICD-10-CM | POA: Diagnosis not present

## 2016-04-24 DIAGNOSIS — N186 End stage renal disease: Secondary | ICD-10-CM | POA: Diagnosis not present

## 2016-04-24 DIAGNOSIS — Z992 Dependence on renal dialysis: Secondary | ICD-10-CM | POA: Diagnosis not present

## 2016-04-24 DIAGNOSIS — I120 Hypertensive chronic kidney disease with stage 5 chronic kidney disease or end stage renal disease: Secondary | ICD-10-CM | POA: Diagnosis not present

## 2016-04-26 DIAGNOSIS — E119 Type 2 diabetes mellitus without complications: Secondary | ICD-10-CM | POA: Diagnosis not present

## 2016-04-26 DIAGNOSIS — D509 Iron deficiency anemia, unspecified: Secondary | ICD-10-CM | POA: Diagnosis not present

## 2016-04-26 DIAGNOSIS — N2581 Secondary hyperparathyroidism of renal origin: Secondary | ICD-10-CM | POA: Diagnosis not present

## 2016-04-26 DIAGNOSIS — N186 End stage renal disease: Secondary | ICD-10-CM | POA: Diagnosis not present

## 2016-04-26 DIAGNOSIS — E1129 Type 2 diabetes mellitus with other diabetic kidney complication: Secondary | ICD-10-CM | POA: Diagnosis not present

## 2016-04-26 DIAGNOSIS — D631 Anemia in chronic kidney disease: Secondary | ICD-10-CM | POA: Diagnosis not present

## 2016-04-27 ENCOUNTER — Encounter: Payer: BLUE CROSS/BLUE SHIELD | Admitting: Physical Therapy

## 2016-04-28 ENCOUNTER — Ambulatory Visit: Payer: Medicare Other | Admitting: Physical Therapy

## 2016-04-28 DIAGNOSIS — D631 Anemia in chronic kidney disease: Secondary | ICD-10-CM | POA: Diagnosis not present

## 2016-04-28 DIAGNOSIS — E119 Type 2 diabetes mellitus without complications: Secondary | ICD-10-CM | POA: Diagnosis not present

## 2016-04-28 DIAGNOSIS — E1129 Type 2 diabetes mellitus with other diabetic kidney complication: Secondary | ICD-10-CM | POA: Diagnosis not present

## 2016-04-28 DIAGNOSIS — D509 Iron deficiency anemia, unspecified: Secondary | ICD-10-CM | POA: Diagnosis not present

## 2016-04-28 DIAGNOSIS — N186 End stage renal disease: Secondary | ICD-10-CM | POA: Diagnosis not present

## 2016-04-28 DIAGNOSIS — N2581 Secondary hyperparathyroidism of renal origin: Secondary | ICD-10-CM | POA: Diagnosis not present

## 2016-04-30 DIAGNOSIS — D631 Anemia in chronic kidney disease: Secondary | ICD-10-CM | POA: Diagnosis not present

## 2016-04-30 DIAGNOSIS — N2581 Secondary hyperparathyroidism of renal origin: Secondary | ICD-10-CM | POA: Diagnosis not present

## 2016-04-30 DIAGNOSIS — E119 Type 2 diabetes mellitus without complications: Secondary | ICD-10-CM | POA: Diagnosis not present

## 2016-04-30 DIAGNOSIS — D509 Iron deficiency anemia, unspecified: Secondary | ICD-10-CM | POA: Diagnosis not present

## 2016-04-30 DIAGNOSIS — N186 End stage renal disease: Secondary | ICD-10-CM | POA: Diagnosis not present

## 2016-04-30 DIAGNOSIS — E1129 Type 2 diabetes mellitus with other diabetic kidney complication: Secondary | ICD-10-CM | POA: Diagnosis not present

## 2016-05-03 DIAGNOSIS — N186 End stage renal disease: Secondary | ICD-10-CM | POA: Diagnosis not present

## 2016-05-03 DIAGNOSIS — E119 Type 2 diabetes mellitus without complications: Secondary | ICD-10-CM | POA: Diagnosis not present

## 2016-05-03 DIAGNOSIS — N2581 Secondary hyperparathyroidism of renal origin: Secondary | ICD-10-CM | POA: Diagnosis not present

## 2016-05-03 DIAGNOSIS — E1129 Type 2 diabetes mellitus with other diabetic kidney complication: Secondary | ICD-10-CM | POA: Diagnosis not present

## 2016-05-03 DIAGNOSIS — D509 Iron deficiency anemia, unspecified: Secondary | ICD-10-CM | POA: Diagnosis not present

## 2016-05-03 DIAGNOSIS — D631 Anemia in chronic kidney disease: Secondary | ICD-10-CM | POA: Diagnosis not present

## 2016-05-04 ENCOUNTER — Ambulatory Visit: Payer: Medicare Other | Admitting: Physical Therapy

## 2016-05-04 ENCOUNTER — Encounter: Payer: Self-pay | Admitting: Physical Therapy

## 2016-05-04 ENCOUNTER — Ambulatory Visit: Payer: Medicare Other | Attending: Nephrology | Admitting: Physical Therapy

## 2016-05-04 DIAGNOSIS — R296 Repeated falls: Secondary | ICD-10-CM | POA: Diagnosis not present

## 2016-05-04 DIAGNOSIS — Z89611 Acquired absence of right leg above knee: Secondary | ICD-10-CM | POA: Insufficient documentation

## 2016-05-04 DIAGNOSIS — R2689 Other abnormalities of gait and mobility: Secondary | ICD-10-CM | POA: Diagnosis not present

## 2016-05-04 DIAGNOSIS — M25651 Stiffness of right hip, not elsewhere classified: Secondary | ICD-10-CM | POA: Diagnosis not present

## 2016-05-04 DIAGNOSIS — R2681 Unsteadiness on feet: Secondary | ICD-10-CM

## 2016-05-04 NOTE — Therapy (Signed)
[facility] [address] [place], [place], [address] Phone: [phone]   Fax:  [phone]  Physical Therapy Treatment  Patient Details  Name: Steven Logan MRN: [id number] Date of Birth: [birth year] Referring Provider: Mauricia Area, MD  Encounter Date: 05/04/2016      PT End of Session - 05/04/16 1325    Visit Number 4   Number of Visits 9   Date for PT Re-Evaluation 04/30/16   Authorization Type Medicare G-code & progress note every 10 visits   PT Start Time 1319   PT Stop Time 1359   PT Time Calculation (min) 40 min   Equipment Utilized During Treatment Gait belt   Activity Tolerance Patient tolerated treatment well   Behavior During Therapy [facility] for tasks assessed/performed      Past Medical History:  Diagnosis Date  . Abscess    great toe  . Anemia   . Arthritis   . Chronic kidney disease    esrd  . Depression   . Diabetes mellitus   . Dyslipidemia   . Eczema   . Gout   . HTN (hypertension)   . Morbid obesity ([place])   . Pancreatitis   . Pneumonia    2-3 years ago  . Pulmonary embolism ([place])    3  in  lungs  at  one time...  . Renal hypertension    Hemo started  Sept 2014- MWF  . Shortness of breath    ?? chest  cold he has now.  10/8- no SOB    Past Surgical History:  Procedure Laterality Date  . AMPUTATION Right 06/12/2013   Procedure: Transtibial Amputation;  Surgeon: Newt Minion, MD;  Location: [facility];  Service: Orthopedics;  Laterality: Right;  . AMPUTATION Right 06/27/2013   Procedure: AMPUTATION ABOVE KNEE;  Surgeon: Newt Minion, MD;  Location: [facility];  Service: Orthopedics;  Laterality: Right;  Right Above Knee Amputation  . AV FISTULA PLACEMENT Left 04/07/2012   Procedure: ARTERIOVENOUS (AV) FISTULA CREATION;  Surgeon: Angelia Mould, MD;  Location: [facility];  Service: Vascular;  Laterality: Left;  . FISTULOGRAM Left 08/07/2012   Procedure: FISTULOGRAM;  Surgeon: Angelia Mould, MD;  Location: [facility] CATH LAB;  Service: Cardiovascular;  Laterality: Left;  arm  . I&D EXTREMITY Right 06/02/2013   Procedure: IRRIGATION AND DEBRIDEMENT ARTHROSCOPIC RIGHT ANKLE;  Surgeon: Augustin Schooling, MD;  Location: [facility];  Service: Orthopedics;  Laterality: Right;  . I&D EXTREMITY Right 06/04/2013   Procedure: IRRIGATION AND DEBRIDEMENT RIGHT FOOT/ANKLE;  Surgeon: Newt Minion, MD;  Location: [facility];  Service: Orthopedics;  Laterality: Right;  . I&D EXTREMITY Right 06/06/2013   Procedure: IRRIGATION AND DEBRIDEMENT EXTREMITY;  Surgeon: Newt Minion, MD;  Location: [facility];  Service: Orthopedics;  Laterality: Right;  . I&D EXTREMITY Right 06/24/2013   Procedure: IRRIGATION AND DEBRIDEMENT and Revsion of TRANSTIBIAL AMPUTATION;  Surgeon: Newt Minion, MD;  Location: [facility];  Service: Orthopedics;  Laterality: Right;  . INSERTION OF DIALYSIS CATHETER Right 09/19/2012   Procedure: INSERTION OF DIALYSIS CATHETER;  Surgeon: Angelia Mould, MD;  Location: [facility];  Service: Vascular;  Laterality: Right;  . LIGATION OF COMPETING BRANCHES OF ARTERIOVENOUS FISTULA Left 09/19/2012   Procedure: LIGATION OF COMPETING BRANCHES OF LEFT ARM ARTERIOVENOUS FISTULA; Vein angioplasty;  Surgeon: Angelia Mould, MD;  Location: [facility];  Service: Vascular;  Laterality: Left;  . none      There were no  vitals filed for this visit.      Subjective Assessment - 05/04/16 1321    Subjective Had 2 root canal's last Tuesday, follows up in another week with oral surgeon. Wearing a mask for now when out in community. Saw transplant team at [facility] this am for yearly follow up to maintain spot on transplant list. Reports their only concern was his HR was low, had to give medicine (epinephrin) to speed it up with stress test. No falls to report.                          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    Currently in Pain? Yes   Pain Score 7    Pain Location Teeth   Pain Orientation Right;Left   Pain Descriptors / Indicators Aching;Sore;Tender   Pain Type Chronic pain   Pain Onset 1 to 4 weeks ago   Pain Frequency Constant   Aggravating Factors  had oral surgery   Pain Relieving Factors medications            [facility] PT Assessment - 05/04/16 1327      Transfers   Transfers Sit to Stand;Stand to Sit   Sit to Stand 6: Modified independent (Device/Increase time);From chair/3-in-1   Stand to Sit 6: Modified independent (Device/Increase time);With upper extremity assist;To bed;To chair/3-in-1     Ambulation/Gait   Ambulation/Gait Yes   Ambulation/Gait Assistance 5: Supervision   Assistive device Prosthesis;None   Gait Pattern Step-through pattern;Decreased arm swing - right;Decreased step length - left;Decreased stance time - right;Decreased stride length;Decreased hip/knee flexion - right;Decreased weight shift to right;Right hip hike;Trunk flexed;Abducted- right;Lateral trunk lean to right   Ambulation Surface Level;Indoor   Gait velocity 9.00 sec= 3.65 ft/sec comfortable, 8 sec= 4.10 ft/sec fast pace   Stairs Yes   Stairs Assistance 5: Supervision   Stairs Assistance Details (indicate cue type and reason) using prosthetic knee hydraulics to descend with cues needed   Stair Management Technique Two rails;Step to pattern;Forwards   Number of Stairs 4   Ramp Details (indicate cue type and reason) prosthesis only, cues to engage hydraulic knee   Curb Details (indicate cue type and reason) prosthesis only, cues on stance for improved balance with transion down or up curb     [employer]   Sit to Stand Able to stand without using hands and stabilize independently   Standing Unsupported Able to stand safely 2 minutes   Sitting with Back Unsupported but Feet Supported on Floor or Stool Able to  sit safely and securely 2 minutes   Stand to Sit Sits safely with minimal use of hands   Transfers Able to transfer safely, minor use of hands   Standing Unsupported with Eyes Closed Able to stand 10 seconds safely   Standing Ubsupported with Feet Together Able to place feet together independently and stand 1 minute safely   From Standing, Reach Forward with Outstretched Arm Can reach confidently >25 cm (10")   From Standing Position, Pick up Object from Floor Able to pick up shoe safely and easily   From Standing Position, Turn to Look Behind Over each Shoulder Looks behind one side only/other side shows less weight shift  right > left   Turn 360 Degrees Able to turn 360 degrees safely but slowly  >  5 sec's both ways   Standing Unsupported, Alternately Place Feet on Step/Stool Able to stand independently and safely and complete 8 steps in 20 seconds   Standing Unsupported, One Foot in Front Able to plae foot ahead of the other independently and hold 30 seconds   Standing on One Leg Able to lift leg independently and hold equal to or more than 3 seconds   Total Score 50     Functional Gait  Assessment   Gait assessed  Yes   Gait Level Surface Walks 20 ft in less than 5.5 sec, no assistive devices, good speed, no evidence for imbalance, normal gait pattern, deviates no more than 6 in outside of the 12 in walkway width.   Change in Gait Speed Able to change speed, demonstrates mild gait deviations, deviates 6-10 in outside of the 12 in walkway width, or no gait deviations, unable to achieve a major change in velocity, or uses a change in velocity, or uses an assistive device.   Gait with Horizontal Head Turns Performs head turns smoothly with slight change in gait velocity (eg, minor disruption to smooth gait path), deviates 6-10 in outside 12 in walkway width, or uses an assistive device.   Gait with Vertical Head Turns Performs head turns with no change in gait. Deviates no more than 6 in  outside 12 in walkway width.   Gait and Pivot Turn Pivot turns safely within 3 sec and stops quickly with no loss of balance.   Step Over Obstacle Is able to step over one shoe box (4.5 in total height) without changing gait speed. No evidence of imbalance.   Gait with Narrow Base of Support Ambulates 4-7 steps.   Gait with Eyes Closed Walks 20 ft, uses assistive device, slower speed, mild gait deviations, deviates 6-10 in outside 12 in walkway width. Ambulates 20 ft in less than 9 sec but greater than 7 sec.   Ambulating Backwards Walks 20 ft, uses assistive device, slower speed, mild gait deviations, deviates 6-10 in outside 12 in walkway width.   Steps Two feet to a stair, must use rail.   Total Score 21              PT Short Term Goals - 04/13/16 1910      PT SHORT TERM GOAL #1   Title Patient verbalizes & demonstrates understanding of initial HEP. (Target Date: 04/02/2016)   Baseline MET 04/13/2016   Time 4   Period Weeks   Status Achieved     PT SHORT TERM GOAL #2   Title Patient ambulates with head turns to scan environment maintaining path & pace with cues.  (Target Date: 04/02/2016)   Baseline NOT MET 04/13/2016 due to limited visits    Time 4   Period Weeks   Status Not Met     PT SHORT TERM GOAL #3   Title Four-Square step test <14 sec to indicate lower fall risk. (Target Date: 04/02/2016)   Baseline NOT MET 04/13/2016 due to limited visits    Time 4   Period Weeks   Status Not Met     PT SHORT TERM GOAL #4   Title Patient demonstrates proper step position & weight shift with direction changes with verbal cues only from PT. (Target Date: 04/02/2016)   Baseline NOT MET 04/13/2016 due to limited visits    Time 4   Period Weeks   Status Not Met     PT SHORT TERM GOAL #5   Title  Patient descends stairs with 2 rails reciprocally with prosthetic knee control. (Target Date: 04/02/2016)   Baseline MET 04/13/2016   Time 4   Period Weeks   Status Achieved           PT  Long Term Goals - 05/04/16 1326      PT LONG TERM GOAL #1   Title Patient ambulates at comfortable gait velocity >4.0 ft/sec and increases to fast pace >5.0 ft/sec safely. (Target Date: 04/30/2016)   Baseline 05/04/16: 3.65 ft/sec no AD comfortable, 4.10 ft/sec fast pace no AD   Status Not Met     PT LONG TERM GOAL #2   Title Patient negotiates ramps & curbs with prosthesis only and descend stairs with 1 rail modified independent.   (Target Date: 04/30/2016)   Baseline 05/04/16: continues to need supervision with cues to fully engage hydraulic knee   Time --   Period --   Status Not Met     PT LONG TERM GOAL #3   Title Berg Balance >52/56 to decrease fall risk  (Target Date: 04/30/2016)   Baseline 05/04/16: 50/56 scored today, improved from eval just not to goal   Time --   Period --   Status Partially Met     PT LONG TERM GOAL #4   Title Functional Gait Assessment >25/30 to decrease fall risk.  (Target Date: 04/30/2016)   Baseline 05/04/16: 21/30 scored today, improved from eval just not to goal   Time --   Period --   Status Not Met     PT LONG TERM GOAL #5   Title Patient lifts & carries 25# 50', carries grocery bag on stairs with 1 rail and performs work simulation tasks safely, independently. (Target Date: 04/30/2016)   Baseline 05/04/16: not tested today due to time constraints with other testing   Time 8   Period Weeks   Status Unable to assess            Plan - 05/04/16 1325    Clinical Impression Statement Today's session focused on progress toward LTGs. This has been limited due to pt missing many apt's for other medical reasons/issues (root canal's, trips to [place], etc). Pt did improved his Berg Balance Test Score and FGA scores despite poor attendance, just not to goal level. Primary PT plans to renew with understanding from pt that attendance will improve over next 8 weeks.                              Rehab Potential Good   PT Frequency 1x / week   PT Duration 8 weeks   PT  Treatment/Interventions ADLs/Self Care Home Management;Gait training;Stair training;Functional mobility training;Therapeutic activities;Therapeutic exercise;Balance training;Neuromuscular re-education;Patient/family education;Prosthetic Training   PT Next Visit Plan walk in halls with head turns, high level balance towards updated goals of renewal (PT to renew for 8 more weeks)   Consulted and Agree with Plan of Care Patient      Patient will benefit from skilled therapeutic intervention in order to improve the following deficits and impairments:  Abnormal gait, Decreased activity tolerance, Decreased balance, Decreased mobility, Decreased safety awareness, Postural dysfunction, Prosthetic Dependency, Decreased range of motion  Visit Diagnosis: Other abnormalities of gait and mobility  Unsteadiness on feet  Repeated falls  Stiffness of right hip, not elsewhere classified     Problem List Patient Active Problem List   Diagnosis Date Noted  . Acute blood loss anemia 06/25/2013  .  Hyperglycemia 06/07/2013  . Arm DVT (deep venous thromboembolism), acute ([place]) 06/07/2013  . ESRD on dialysis ([place]) 06/05/2013  . Encephalopathy, toxic 06/03/2013  . Cellulitis and abscess of leg 06/03/2013  . Anemia in chronic kidney disease 06/03/2013  . Arm edema 06/03/2013  . Leg abscess 06/03/2013  . Severe sepsis ([place]) 06/03/2013  . Septic shock ([place]) 06/03/2013  . Septic arthritis of ankle or foot, right 06/03/2013  . Abscess of right lower leg 06/02/2013  . Wrist lump 07/05/2012  . Swelling of limb-Left index and middle fingers 07/05/2012  . Aftercare following surgery of the circulatory system, [place] 07/05/2012  . Tingling-left wrist and some pain. 07/05/2012  . Pulmonary edema 05/04/2012  . Acute respiratory failure ([place]) 05/04/2012  . CKD (chronic kidney disease) stage 5, GFR less than 15 ml/min ([facility]) 05/04/2012  . Hyperkalemia 05/04/2012  . Pre-operative cardiovascular examination  03/15/2012  . Iron deficiency anemia 09/22/2010  . HTN (hypertension) 09/22/2010  . Hyperlipidemia 09/22/2010  . Gout 09/22/2010  . CRI (chronic renal insufficiency) 09/22/2010  . Pulmonary embolism, bilateral ([place]) 09/22/2010    Willow Ora, PTA, [facility] [address] [place], [place] [address] [phone] 05/04/16, 3:09 PM   Name: Steven Logan MRN: [id number] Date of Birth: [birth year]

## 2016-05-05 DIAGNOSIS — N186 End stage renal disease: Secondary | ICD-10-CM | POA: Diagnosis not present

## 2016-05-05 DIAGNOSIS — E119 Type 2 diabetes mellitus without complications: Secondary | ICD-10-CM | POA: Diagnosis not present

## 2016-05-05 DIAGNOSIS — D631 Anemia in chronic kidney disease: Secondary | ICD-10-CM | POA: Diagnosis not present

## 2016-05-05 DIAGNOSIS — E1129 Type 2 diabetes mellitus with other diabetic kidney complication: Secondary | ICD-10-CM | POA: Diagnosis not present

## 2016-05-05 DIAGNOSIS — D509 Iron deficiency anemia, unspecified: Secondary | ICD-10-CM | POA: Diagnosis not present

## 2016-05-05 DIAGNOSIS — N2581 Secondary hyperparathyroidism of renal origin: Secondary | ICD-10-CM | POA: Diagnosis not present

## 2016-05-07 DIAGNOSIS — D509 Iron deficiency anemia, unspecified: Secondary | ICD-10-CM | POA: Diagnosis not present

## 2016-05-07 DIAGNOSIS — E1129 Type 2 diabetes mellitus with other diabetic kidney complication: Secondary | ICD-10-CM | POA: Diagnosis not present

## 2016-05-07 DIAGNOSIS — E119 Type 2 diabetes mellitus without complications: Secondary | ICD-10-CM | POA: Diagnosis not present

## 2016-05-07 DIAGNOSIS — N2581 Secondary hyperparathyroidism of renal origin: Secondary | ICD-10-CM | POA: Diagnosis not present

## 2016-05-07 DIAGNOSIS — D631 Anemia in chronic kidney disease: Secondary | ICD-10-CM | POA: Diagnosis not present

## 2016-05-07 DIAGNOSIS — N186 End stage renal disease: Secondary | ICD-10-CM | POA: Diagnosis not present

## 2016-05-10 DIAGNOSIS — E119 Type 2 diabetes mellitus without complications: Secondary | ICD-10-CM | POA: Diagnosis not present

## 2016-05-10 DIAGNOSIS — N186 End stage renal disease: Secondary | ICD-10-CM | POA: Diagnosis not present

## 2016-05-10 DIAGNOSIS — D631 Anemia in chronic kidney disease: Secondary | ICD-10-CM | POA: Diagnosis not present

## 2016-05-10 DIAGNOSIS — N2581 Secondary hyperparathyroidism of renal origin: Secondary | ICD-10-CM | POA: Diagnosis not present

## 2016-05-10 DIAGNOSIS — E1129 Type 2 diabetes mellitus with other diabetic kidney complication: Secondary | ICD-10-CM | POA: Diagnosis not present

## 2016-05-10 DIAGNOSIS — D509 Iron deficiency anemia, unspecified: Secondary | ICD-10-CM | POA: Diagnosis not present

## 2016-05-11 ENCOUNTER — Encounter: Payer: BLUE CROSS/BLUE SHIELD | Admitting: Physical Therapy

## 2016-05-12 DIAGNOSIS — D509 Iron deficiency anemia, unspecified: Secondary | ICD-10-CM | POA: Diagnosis not present

## 2016-05-12 DIAGNOSIS — E1129 Type 2 diabetes mellitus with other diabetic kidney complication: Secondary | ICD-10-CM | POA: Diagnosis not present

## 2016-05-12 DIAGNOSIS — N2581 Secondary hyperparathyroidism of renal origin: Secondary | ICD-10-CM | POA: Diagnosis not present

## 2016-05-12 DIAGNOSIS — E119 Type 2 diabetes mellitus without complications: Secondary | ICD-10-CM | POA: Diagnosis not present

## 2016-05-12 DIAGNOSIS — N186 End stage renal disease: Secondary | ICD-10-CM | POA: Diagnosis not present

## 2016-05-12 DIAGNOSIS — D631 Anemia in chronic kidney disease: Secondary | ICD-10-CM | POA: Diagnosis not present

## 2016-05-14 DIAGNOSIS — D509 Iron deficiency anemia, unspecified: Secondary | ICD-10-CM | POA: Diagnosis not present

## 2016-05-14 DIAGNOSIS — N186 End stage renal disease: Secondary | ICD-10-CM | POA: Diagnosis not present

## 2016-05-14 DIAGNOSIS — N2581 Secondary hyperparathyroidism of renal origin: Secondary | ICD-10-CM | POA: Diagnosis not present

## 2016-05-14 DIAGNOSIS — E119 Type 2 diabetes mellitus without complications: Secondary | ICD-10-CM | POA: Diagnosis not present

## 2016-05-14 DIAGNOSIS — D631 Anemia in chronic kidney disease: Secondary | ICD-10-CM | POA: Diagnosis not present

## 2016-05-14 DIAGNOSIS — E1129 Type 2 diabetes mellitus with other diabetic kidney complication: Secondary | ICD-10-CM | POA: Diagnosis not present

## 2016-05-17 DIAGNOSIS — E119 Type 2 diabetes mellitus without complications: Secondary | ICD-10-CM | POA: Diagnosis not present

## 2016-05-17 DIAGNOSIS — N2581 Secondary hyperparathyroidism of renal origin: Secondary | ICD-10-CM | POA: Diagnosis not present

## 2016-05-17 DIAGNOSIS — D509 Iron deficiency anemia, unspecified: Secondary | ICD-10-CM | POA: Diagnosis not present

## 2016-05-17 DIAGNOSIS — D631 Anemia in chronic kidney disease: Secondary | ICD-10-CM | POA: Diagnosis not present

## 2016-05-17 DIAGNOSIS — E1129 Type 2 diabetes mellitus with other diabetic kidney complication: Secondary | ICD-10-CM | POA: Diagnosis not present

## 2016-05-17 DIAGNOSIS — N186 End stage renal disease: Secondary | ICD-10-CM | POA: Diagnosis not present

## 2016-05-18 ENCOUNTER — Encounter: Payer: Self-pay | Admitting: Physical Therapy

## 2016-05-18 ENCOUNTER — Ambulatory Visit: Payer: Medicare Other | Admitting: Physical Therapy

## 2016-05-18 DIAGNOSIS — R2689 Other abnormalities of gait and mobility: Secondary | ICD-10-CM | POA: Diagnosis not present

## 2016-05-18 DIAGNOSIS — M25651 Stiffness of right hip, not elsewhere classified: Secondary | ICD-10-CM

## 2016-05-18 DIAGNOSIS — R2681 Unsteadiness on feet: Secondary | ICD-10-CM

## 2016-05-18 DIAGNOSIS — R296 Repeated falls: Secondary | ICD-10-CM | POA: Diagnosis not present

## 2016-05-18 DIAGNOSIS — Z89611 Acquired absence of right leg above knee: Secondary | ICD-10-CM | POA: Diagnosis not present
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
## 2016-05-19 DIAGNOSIS — N2581 Secondary hyperparathyroidism of renal origin: Secondary | ICD-10-CM | POA: Diagnosis not present

## 2016-05-19 DIAGNOSIS — D509 Iron deficiency anemia, unspecified: Secondary | ICD-10-CM | POA: Diagnosis not present

## 2016-05-19 DIAGNOSIS — E1129 Type 2 diabetes mellitus with other diabetic kidney complication: Secondary | ICD-10-CM | POA: Diagnosis not present

## 2016-05-19 DIAGNOSIS — E119 Type 2 diabetes mellitus without complications: Secondary | ICD-10-CM | POA: Diagnosis not present

## 2016-05-19 DIAGNOSIS — D631 Anemia in chronic kidney disease: Secondary | ICD-10-CM | POA: Diagnosis not present

## 2016-05-19 DIAGNOSIS — N186 End stage renal disease: Secondary | ICD-10-CM | POA: Diagnosis not present

## 2016-05-21 DIAGNOSIS — N186 End stage renal disease: Secondary | ICD-10-CM | POA: Diagnosis not present

## 2016-05-21 DIAGNOSIS — D631 Anemia in chronic kidney disease: Secondary | ICD-10-CM | POA: Diagnosis not present

## 2016-05-21 DIAGNOSIS — D509 Iron deficiency anemia, unspecified: Secondary | ICD-10-CM | POA: Diagnosis not present

## 2016-05-21 DIAGNOSIS — N2581 Secondary hyperparathyroidism of renal origin: Secondary | ICD-10-CM | POA: Diagnosis not present

## 2016-05-21 DIAGNOSIS — E119 Type 2 diabetes mellitus without complications: Secondary | ICD-10-CM | POA: Diagnosis not present

## 2016-05-21 DIAGNOSIS — E1129 Type 2 diabetes mellitus with other diabetic kidney complication: Secondary | ICD-10-CM | POA: Diagnosis not present

## 2016-05-21 NOTE — Addendum Note (Signed)
Addended by: Isaias Cowman on: 05/21/2016 10:42 AM   Modules accepted: Orders

## 2016-05-24 DIAGNOSIS — D509 Iron deficiency anemia, unspecified: Secondary | ICD-10-CM | POA: Diagnosis not present

## 2016-05-24 DIAGNOSIS — Z992 Dependence on renal dialysis: Secondary | ICD-10-CM | POA: Diagnosis not present

## 2016-05-24 DIAGNOSIS — E119 Type 2 diabetes mellitus without complications: Secondary | ICD-10-CM | POA: Diagnosis not present

## 2016-05-24 DIAGNOSIS — E1129 Type 2 diabetes mellitus with other diabetic kidney complication: Secondary | ICD-10-CM | POA: Diagnosis not present

## 2016-05-24 DIAGNOSIS — N186 End stage renal disease: Secondary | ICD-10-CM | POA: Diagnosis not present

## 2016-05-24 DIAGNOSIS — N2581 Secondary hyperparathyroidism of renal origin: Secondary | ICD-10-CM | POA: Diagnosis not present

## 2016-05-24 DIAGNOSIS — I120 Hypertensive chronic kidney disease with stage 5 chronic kidney disease or end stage renal disease: Secondary | ICD-10-CM | POA: Diagnosis not present

## 2016-05-24 DIAGNOSIS — D631 Anemia in chronic kidney disease: Secondary | ICD-10-CM | POA: Diagnosis not present

## 2016-05-25 ENCOUNTER — Ambulatory Visit: Payer: Medicare Other | Attending: Nephrology | Admitting: Physical Therapy

## 2016-05-25 ENCOUNTER — Encounter: Payer: Self-pay | Admitting: Physical Therapy

## 2016-05-25 DIAGNOSIS — R296 Repeated falls: Secondary | ICD-10-CM | POA: Diagnosis not present

## 2016-05-25 DIAGNOSIS — M6281 Muscle weakness (generalized): Secondary | ICD-10-CM | POA: Insufficient documentation

## 2016-05-25 DIAGNOSIS — Z794 Long term (current) use of insulin: Secondary | ICD-10-CM | POA: Diagnosis not present

## 2016-05-25 DIAGNOSIS — R2689 Other abnormalities of gait and mobility: Secondary | ICD-10-CM | POA: Insufficient documentation

## 2016-05-25 DIAGNOSIS — M25651 Stiffness of right hip, not elsewhere classified: Secondary | ICD-10-CM | POA: Diagnosis not present

## 2016-05-25 DIAGNOSIS — R2681 Unsteadiness on feet: Secondary | ICD-10-CM | POA: Diagnosis not present

## 2016-05-25 DIAGNOSIS — E1165 Type 2 diabetes mellitus with hyperglycemia: Secondary | ICD-10-CM | POA: Diagnosis not present

## 2016-05-25 DIAGNOSIS — Z992 Dependence on renal dialysis: Secondary | ICD-10-CM | POA: Diagnosis not present

## 2016-05-25 DIAGNOSIS — Z949 Transplanted organ and tissue status, unspecified: Secondary | ICD-10-CM | POA: Diagnosis not present

## 2016-05-25 DIAGNOSIS — N186 End stage renal disease: Secondary | ICD-10-CM | POA: Diagnosis present

## 2016-05-25 DIAGNOSIS — E1121 Type 2 diabetes mellitus with diabetic nephropathy: Secondary | ICD-10-CM | POA: Diagnosis not present

## 2016-05-25 DIAGNOSIS — Z7982 Long term (current) use of aspirin: Secondary | ICD-10-CM | POA: Diagnosis not present

## 2016-05-25 DIAGNOSIS — Z89611 Acquired absence of right leg above knee: Secondary | ICD-10-CM | POA: Insufficient documentation

## 2016-05-25 DIAGNOSIS — Z940 Kidney transplant status: Secondary | ICD-10-CM | POA: Diagnosis not present

## 2016-05-25 DIAGNOSIS — Z79899 Other long term (current) drug therapy: Secondary | ICD-10-CM | POA: Diagnosis not present

## 2016-05-25 DIAGNOSIS — Z86711 Personal history of pulmonary embolism: Secondary | ICD-10-CM | POA: Diagnosis not present

## 2016-05-25 DIAGNOSIS — Z01818 Encounter for other preprocedural examination: Secondary | ICD-10-CM | POA: Diagnosis not present

## 2016-05-25 DIAGNOSIS — Z5181 Encounter for therapeutic drug level monitoring: Secondary | ICD-10-CM | POA: Diagnosis not present

## 2016-05-25 DIAGNOSIS — D631 Anemia in chronic kidney disease: Secondary | ICD-10-CM | POA: Diagnosis present

## 2016-05-25 DIAGNOSIS — I10 Essential (primary) hypertension: Secondary | ICD-10-CM | POA: Diagnosis present

## 2016-05-25 DIAGNOSIS — Z86718 Personal history of other venous thrombosis and embolism: Secondary | ICD-10-CM | POA: Diagnosis not present

## 2016-05-25 DIAGNOSIS — I120 Hypertensive chronic kidney disease with stage 5 chronic kidney disease or end stage renal disease: Secondary | ICD-10-CM | POA: Diagnosis not present

## 2016-05-25 DIAGNOSIS — Z7901 Long term (current) use of anticoagulants: Secondary | ICD-10-CM | POA: Diagnosis not present

## 2016-05-25 DIAGNOSIS — Z4822 Encounter for aftercare following kidney transplant: Secondary | ICD-10-CM | POA: Diagnosis not present

## 2016-05-25 DIAGNOSIS — E8809 Other disorders of plasma-protein metabolism, not elsewhere classified: Secondary | ICD-10-CM | POA: Diagnosis not present

## 2016-05-25 DIAGNOSIS — E1122 Type 2 diabetes mellitus with diabetic chronic kidney disease: Secondary | ICD-10-CM | POA: Diagnosis present

## 2016-05-25 DIAGNOSIS — D899 Disorder involving the immune mechanism, unspecified: Secondary | ICD-10-CM | POA: Diagnosis not present

## 2016-05-25 HISTORY — DX: Kidney transplant status: Z94.0

## 2016-05-26 DIAGNOSIS — D849 Immunodeficiency, unspecified: Secondary | ICD-10-CM

## 2016-05-26 HISTORY — DX: Immunodeficiency, unspecified: D84.9

## 2016-05-26 NOTE — Therapy (Signed)
[facility] [address] [place], [place], [address] Phone: [phone]   Fax:  [phone]  Physical Therapy Treatment  Patient Details  Name: Steven Logan MRN: [id number] Date of Birth: 09-Feb-1970 Referring Provider: Mauricia Area, MD  Encounter Date: 05/25/2016      PT End of Session - 05/25/16 1629    Visit Number 6   Number of Visits 12   Date for PT Re-Evaluation 07/02/16   Authorization Type Medicare G-code & progress note every 10 visits   PT Start Time 0845   PT Stop Time 0930   PT Time Calculation (min) 45 min   Equipment Utilized During Treatment Gait belt   Activity Tolerance Patient tolerated treatment well   Behavior During Therapy [facility] for tasks assessed/performed      Past Medical History:  Diagnosis Date  . Abscess    great toe  . Anemia   . Arthritis   . Chronic kidney disease    esrd  . Depression   . Diabetes mellitus   . Dyslipidemia   . Eczema   . Gout   . HTN (hypertension)   . Morbid obesity ([place])   . Pancreatitis   . Pneumonia    2-3 years ago  . Pulmonary embolism ([place])    3  in  lungs  at  one time...  . Renal hypertension    Hemo started  Sept 2014- MWF  . Shortness of breath    ?? chest  cold he has now.  10/8- no SOB    Past Surgical History:  Procedure Laterality Date  . AMPUTATION Right 06/12/2013   Procedure: Transtibial Amputation;  Surgeon: Newt Minion, MD;  Location: [place];  Service: Orthopedics;  Laterality: Right;  . AMPUTATION Right 06/27/2013   Procedure: AMPUTATION ABOVE KNEE;  Surgeon: Newt Minion, MD;  Location: [place];  Service: Orthopedics;  Laterality: Right;  Right Above Knee Amputation  . AV FISTULA PLACEMENT Left 04/07/2012   Procedure: ARTERIOVENOUS (AV) FISTULA CREATION;  Surgeon: Angelia Mould, MD;  Location: [place];  Service: Vascular;  Laterality: Left;  . FISTULOGRAM Left 08/07/2012   Procedure: FISTULOGRAM;  Surgeon: Angelia Mould, MD;  Location: [facility] CATH LAB;  Service: Cardiovascular;  Laterality: Left;  arm  . I&D EXTREMITY Right 06/02/2013   Procedure: IRRIGATION AND DEBRIDEMENT ARTHROSCOPIC RIGHT ANKLE;  Surgeon: Augustin Schooling, MD;  Location: [place];  Service: Orthopedics;  Laterality: Right;  . I&D EXTREMITY Right 06/04/2013   Procedure: IRRIGATION AND DEBRIDEMENT RIGHT FOOT/ANKLE;  Surgeon: Newt Minion, MD;  Location: [place];  Service: Orthopedics;  Laterality: Right;  . I&D EXTREMITY Right 06/06/2013   Procedure: IRRIGATION AND DEBRIDEMENT EXTREMITY;  Surgeon: Newt Minion, MD;  Location: [place];  Service: Orthopedics;  Laterality: Right;  . I&D EXTREMITY Right 06/24/2013   Procedure: IRRIGATION AND DEBRIDEMENT and Revsion of TRANSTIBIAL AMPUTATION;  Surgeon: Newt Minion, MD;  Location: [place];  Service: Orthopedics;  Laterality: Right;  . INSERTION OF DIALYSIS CATHETER Right 09/19/2012   Procedure: INSERTION OF DIALYSIS CATHETER;  Surgeon: Angelia Mould, MD;  Location: [place];  Service: Vascular;  Laterality: Right;  . LIGATION OF COMPETING BRANCHES OF ARTERIOVENOUS FISTULA Left 09/19/2012   Procedure: LIGATION OF COMPETING BRANCHES OF LEFT ARM ARTERIOVENOUS FISTULA; Vein angioplasty;  Surgeon: Angelia Mould, MD;  Location: [place];  Service: Vascular;  Laterality: Left;  . none      There were no  vitals filed for this visit.      Subjective Assessment - 05/25/16 0849    Subjective No falls. Dialysis has been going well. He has been working on lifting as instructed last session.    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    Currently in Pain? No/denies     Prosthetic Training with Transfemoral Microprocessor prosthesis: PT demo, verbal cues how to engage hydraulics with activities noted below. Sit to /  from stand, progressed to using technique to lower & raise weight to pick up object from floor. Progressed to lifting 15# box. Pt able to return demo technique with verbal & tactile cues. Descend stairs with 2 rails, initially step-to leading with RLE to engage prosthesis, progressed to alternating pattern. Pt able to return demo with verbal & tactile cues with multiple reps. In parallel bars with mirror & 2"x4" for proper step width (demo how to use counter top for practice at home) terminal stance knee flexion with proper weight shift & maintaining contact with ground. Progressed to advancing prosthesis with pelvic motion. Pt return demo understanding with tactile & verbal cues with multiple reps.                               PT Short Term Goals - 05/25/16 6433      PT SHORT TERM GOAL #1   Title Patient verbalizes & demonstrates understanding of updated HEP. (Target Date: 06/03/2016)   Time 4   Period Weeks   Status New     PT SHORT TERM GOAL #2   Title Patient ambulates with head turns to scan environment maintaining path & pace with cues. (Target Date: 06/03/2016)   Baseline NOT MET 04/13/2016 due to limited visits  PT continued STG   Time 4   Period Weeks   Status On-going     PT SHORT TERM GOAL #3   Title Four-Square step test <14 sec to indicate lower fall risk. (Target Date: 06/03/2016)   Baseline NOT MET 04/13/2016 due to limited visits  PT continued STG   Time 4   Period Weeks   Status On-going     PT SHORT TERM GOAL #4   Title Patient demonstrates proper step position & weight shift with direction changes with verbal cues only from PT. (Target Date: 06/03/2016)   Baseline NOT MET 04/13/2016 due to limited visits  PT continued STGs   Time 4   Period Weeks   Status On-going     PT SHORT TERM GOAL #5   Title Patient descends stairs with 2 rails reciprocally with prosthetic knee control. (Target Date: 04/02/2016)   Baseline MET 04/13/2016   Time 4   Period  Weeks   Status Achieved           PT Long Term Goals - 05/25/16 2951      PT LONG TERM GOAL #1   Title Patient ambulates at comfortable gait velocity >4.0 ft/sec and increases to fast pace >5.0 ft/sec safely. (Target Date: 07/02/2016)   Baseline 05/04/16: 3.65 ft/sec no AD comfortable, 4.10 ft/sec fast pace no AD  PT continued LTG   Time 8   Period Weeks   Status On-going     PT LONG TERM GOAL #2   Title Patient negotiates ramps & curbs with prosthesis only  and descend stairs with 1 rail modified independent.   (Target Date: 07/02/2016)   Baseline 05/04/16: continues to need supervision with cues to fully engage hydraulic knee  PT continued LTG   Time 8   Period Weeks   Status On-going     PT LONG TERM GOAL #3   Title Berg Balance >52/56 to decrease fall risk  (Target Date: 07/02/2016)   Baseline 05/04/16: 50/56 scored today, improved from eval just not to goal  PT continued LTG   Time 8   Period Weeks   Status On-going     PT LONG TERM GOAL #4   Title Functional Gait Assessment >25/30 to decrease fall risk.  (Target Date: 07/02/2016)   Baseline 05/04/16: 21/30 scored today, improved from eval just not to goal  PT continued LTG   Time 8   Period Weeks   Status On-going     PT LONG TERM GOAL #5   Title Patient lifts & carries 25# 50', carries grocery bag on stairs with 1 rail and performs work simulation tasks safely, independently. (Target Date: 07/02/2016)   Baseline 05/04/16: not tested today due to time constraints with other testing  PT continued LTG   Time 8   Period Weeks   Status On-going               Plan - 05/25/16 1630    Clinical Impression Statement With skilled instruction, patient was able to engage microprocessor hydraulics with sit to/from stand, picking up objects & box, descending stairs and terminal stance knee flexion.    Rehab Potential Good   PT Frequency 1x / week   PT Duration 8 weeks   PT Treatment/Interventions ADLs/Self Care Home Management;Gait  training;Stair training;Functional mobility training;Therapeutic activities;Therapeutic exercise;Balance training;Neuromuscular re-education;Patient/family education;Prosthetic Training   PT Next Visit Plan progress lifting & carrying, descending stairs reciprocally, ramps and knee flexion to advance prosthesis   Consulted and Agree with Plan of Care Patient      Patient will benefit from skilled therapeutic intervention in order to improve the following deficits and impairments:  Abnormal gait, Decreased activity tolerance, Decreased balance, Decreased mobility, Decreased safety awareness, Postural dysfunction, Prosthetic Dependency, Decreased range of motion  Visit Diagnosis: Other abnormalities of gait and mobility  Unsteadiness on feet  Repeated falls  Status post above knee amputation of right lower extremity [facility])     Problem List Patient Active Problem List   Diagnosis Date Noted  . Acute blood loss anemia 06/25/2013  . Hyperglycemia 06/07/2013  . Arm DVT (deep venous thromboembolism), acute ([place]) 06/07/2013  . ESRD on dialysis ([place]) 06/05/2013  . Encephalopathy, toxic 06/03/2013  . Cellulitis and abscess of leg 06/03/2013  . Anemia in chronic kidney disease 06/03/2013  . Arm edema 06/03/2013  . Leg abscess 06/03/2013  . Severe sepsis ([place]) 06/03/2013  . Septic shock ([place]) 06/03/2013  . Septic arthritis of ankle or foot, right 06/03/2013  . Abscess of right lower leg 06/02/2013  . Wrist lump 07/05/2012  . Swelling of limb-Left index and middle fingers 07/05/2012  . Aftercare following surgery of the circulatory system, [place] 07/05/2012  . Tingling-left wrist and some pain. 07/05/2012  . Pulmonary edema 05/04/2012  . Acute respiratory failure ([place]) 05/04/2012  . CKD (chronic kidney disease) stage 5, GFR less than 15 ml/min (HCC) 05/04/2012  . Hyperkalemia 05/04/2012  . Pre-operative cardiovascular examination 03/15/2012  . Iron deficiency anemia 09/22/2010  . HTN  (hypertension) 09/22/2010  . Hyperlipidemia 09/22/2010  . Gout 09/22/2010  .  CRI (chronic renal insufficiency) 09/22/2010  . Pulmonary embolism, bilateral ([place]) 09/22/2010    Assad Harbeson  PT, DPT 05/26/2016, 6:35 AM  [facility] [address] [place], [place], [address] Phone: [phone]   Fax:  [phone]  Name: TREVONTE ASHKAR MRN: [id number] Date of Birth: [birth year]

## 2016-06-01 ENCOUNTER — Ambulatory Visit: Payer: Medicare Other | Admitting: Physical Therapy

## 2016-06-03 DIAGNOSIS — D631 Anemia in chronic kidney disease: Secondary | ICD-10-CM | POA: Diagnosis not present

## 2016-06-03 DIAGNOSIS — Z4822 Encounter for aftercare following kidney transplant: Secondary | ICD-10-CM | POA: Diagnosis not present

## 2016-06-03 DIAGNOSIS — D649 Anemia, unspecified: Secondary | ICD-10-CM | POA: Diagnosis not present

## 2016-06-03 DIAGNOSIS — Z992 Dependence on renal dialysis: Secondary | ICD-10-CM | POA: Diagnosis not present

## 2016-06-03 DIAGNOSIS — K5900 Constipation, unspecified: Secondary | ICD-10-CM | POA: Diagnosis not present

## 2016-06-03 DIAGNOSIS — R609 Edema, unspecified: Secondary | ICD-10-CM | POA: Diagnosis not present

## 2016-06-03 DIAGNOSIS — E1122 Type 2 diabetes mellitus with diabetic chronic kidney disease: Secondary | ICD-10-CM | POA: Diagnosis not present

## 2016-06-03 DIAGNOSIS — D899 Disorder involving the immune mechanism, unspecified: Secondary | ICD-10-CM | POA: Diagnosis not present

## 2016-06-03 DIAGNOSIS — I10 Essential (primary) hypertension: Secondary | ICD-10-CM | POA: Diagnosis not present

## 2016-06-03 DIAGNOSIS — E119 Type 2 diabetes mellitus without complications: Secondary | ICD-10-CM | POA: Diagnosis not present

## 2016-06-03 DIAGNOSIS — I120 Hypertensive chronic kidney disease with stage 5 chronic kidney disease or end stage renal disease: Secondary | ICD-10-CM | POA: Diagnosis not present

## 2016-06-03 DIAGNOSIS — Z79899 Other long term (current) drug therapy: Secondary | ICD-10-CM | POA: Diagnosis not present

## 2016-06-03 DIAGNOSIS — Z7982 Long term (current) use of aspirin: Secondary | ICD-10-CM | POA: Diagnosis not present

## 2016-06-03 DIAGNOSIS — N186 End stage renal disease: Secondary | ICD-10-CM | POA: Diagnosis not present

## 2016-06-03 DIAGNOSIS — Z940 Kidney transplant status: Secondary | ICD-10-CM | POA: Diagnosis not present

## 2016-06-07 DIAGNOSIS — I120 Hypertensive chronic kidney disease with stage 5 chronic kidney disease or end stage renal disease: Secondary | ICD-10-CM | POA: Diagnosis not present

## 2016-06-07 DIAGNOSIS — Z940 Kidney transplant status: Secondary | ICD-10-CM | POA: Diagnosis not present

## 2016-06-07 DIAGNOSIS — E1122 Type 2 diabetes mellitus with diabetic chronic kidney disease: Secondary | ICD-10-CM | POA: Diagnosis not present

## 2016-06-07 DIAGNOSIS — I151 Hypertension secondary to other renal disorders: Secondary | ICD-10-CM | POA: Diagnosis not present

## 2016-06-07 DIAGNOSIS — Z4822 Encounter for aftercare following kidney transplant: Secondary | ICD-10-CM | POA: Diagnosis not present

## 2016-06-07 DIAGNOSIS — N186 End stage renal disease: Secondary | ICD-10-CM | POA: Diagnosis not present

## 2016-06-07 DIAGNOSIS — Z7984 Long term (current) use of oral hypoglycemic drugs: Secondary | ICD-10-CM | POA: Diagnosis not present

## 2016-06-07 DIAGNOSIS — D899 Disorder involving the immune mechanism, unspecified: Secondary | ICD-10-CM | POA: Diagnosis not present

## 2016-06-07 DIAGNOSIS — Z7982 Long term (current) use of aspirin: Secondary | ICD-10-CM | POA: Diagnosis not present

## 2016-06-07 DIAGNOSIS — D631 Anemia in chronic kidney disease: Secondary | ICD-10-CM | POA: Diagnosis not present

## 2016-06-07 DIAGNOSIS — N2889 Other specified disorders of kidney and ureter: Secondary | ICD-10-CM | POA: Diagnosis not present

## 2016-06-08 ENCOUNTER — Ambulatory Visit: Payer: Medicare Other | Admitting: Physical Therapy

## 2016-06-10 DIAGNOSIS — I120 Hypertensive chronic kidney disease with stage 5 chronic kidney disease or end stage renal disease: Secondary | ICD-10-CM | POA: Diagnosis not present

## 2016-06-10 DIAGNOSIS — Z7952 Long term (current) use of systemic steroids: Secondary | ICD-10-CM | POA: Diagnosis not present

## 2016-06-10 DIAGNOSIS — Z7984 Long term (current) use of oral hypoglycemic drugs: Secondary | ICD-10-CM | POA: Diagnosis not present

## 2016-06-10 DIAGNOSIS — E1122 Type 2 diabetes mellitus with diabetic chronic kidney disease: Secondary | ICD-10-CM | POA: Diagnosis not present

## 2016-06-10 DIAGNOSIS — E119 Type 2 diabetes mellitus without complications: Secondary | ICD-10-CM | POA: Diagnosis not present

## 2016-06-10 DIAGNOSIS — Z992 Dependence on renal dialysis: Secondary | ICD-10-CM | POA: Diagnosis not present

## 2016-06-10 DIAGNOSIS — D899 Disorder involving the immune mechanism, unspecified: Secondary | ICD-10-CM | POA: Diagnosis not present

## 2016-06-10 DIAGNOSIS — Z79899 Other long term (current) drug therapy: Secondary | ICD-10-CM | POA: Diagnosis not present

## 2016-06-10 DIAGNOSIS — K5900 Constipation, unspecified: Secondary | ICD-10-CM | POA: Diagnosis not present

## 2016-06-10 DIAGNOSIS — Z7982 Long term (current) use of aspirin: Secondary | ICD-10-CM | POA: Diagnosis not present

## 2016-06-10 DIAGNOSIS — N186 End stage renal disease: Secondary | ICD-10-CM | POA: Diagnosis not present

## 2016-06-10 DIAGNOSIS — R609 Edema, unspecified: Secondary | ICD-10-CM | POA: Diagnosis not present

## 2016-06-10 DIAGNOSIS — Z4822 Encounter for aftercare following kidney transplant: Secondary | ICD-10-CM | POA: Diagnosis not present

## 2016-06-10 DIAGNOSIS — D631 Anemia in chronic kidney disease: Secondary | ICD-10-CM | POA: Diagnosis not present

## 2016-06-10 DIAGNOSIS — Z940 Kidney transplant status: Secondary | ICD-10-CM | POA: Diagnosis not present

## 2016-06-14 DIAGNOSIS — Z89511 Acquired absence of right leg below knee: Secondary | ICD-10-CM | POA: Diagnosis not present

## 2016-06-14 DIAGNOSIS — Z7952 Long term (current) use of systemic steroids: Secondary | ICD-10-CM | POA: Diagnosis not present

## 2016-06-14 DIAGNOSIS — Z4822 Encounter for aftercare following kidney transplant: Secondary | ICD-10-CM | POA: Diagnosis not present

## 2016-06-14 DIAGNOSIS — D8989 Other specified disorders involving the immune mechanism, not elsewhere classified: Secondary | ICD-10-CM | POA: Diagnosis not present

## 2016-06-14 DIAGNOSIS — Z992 Dependence on renal dialysis: Secondary | ICD-10-CM | POA: Diagnosis not present

## 2016-06-14 DIAGNOSIS — Z79899 Other long term (current) drug therapy: Secondary | ICD-10-CM | POA: Diagnosis not present

## 2016-06-14 DIAGNOSIS — Z794 Long term (current) use of insulin: Secondary | ICD-10-CM | POA: Diagnosis not present

## 2016-06-14 DIAGNOSIS — Z955 Presence of coronary angioplasty implant and graft: Secondary | ICD-10-CM | POA: Diagnosis not present

## 2016-06-14 DIAGNOSIS — I120 Hypertensive chronic kidney disease with stage 5 chronic kidney disease or end stage renal disease: Secondary | ICD-10-CM | POA: Diagnosis not present

## 2016-06-14 DIAGNOSIS — D899 Disorder involving the immune mechanism, unspecified: Secondary | ICD-10-CM | POA: Diagnosis not present

## 2016-06-14 DIAGNOSIS — I10 Essential (primary) hypertension: Secondary | ICD-10-CM | POA: Diagnosis not present

## 2016-06-14 DIAGNOSIS — Z940 Kidney transplant status: Secondary | ICD-10-CM | POA: Diagnosis not present

## 2016-06-14 DIAGNOSIS — N186 End stage renal disease: Secondary | ICD-10-CM | POA: Diagnosis not present

## 2016-06-14 DIAGNOSIS — E1122 Type 2 diabetes mellitus with diabetic chronic kidney disease: Secondary | ICD-10-CM | POA: Diagnosis not present

## 2016-06-14 DIAGNOSIS — D631 Anemia in chronic kidney disease: Secondary | ICD-10-CM | POA: Diagnosis not present

## 2016-06-14 DIAGNOSIS — D649 Anemia, unspecified: Secondary | ICD-10-CM | POA: Diagnosis not present

## 2016-06-14 DIAGNOSIS — M79661 Pain in right lower leg: Secondary | ICD-10-CM | POA: Diagnosis not present

## 2016-06-14 DIAGNOSIS — E119 Type 2 diabetes mellitus without complications: Secondary | ICD-10-CM | POA: Diagnosis not present

## 2016-06-14 DIAGNOSIS — Z7982 Long term (current) use of aspirin: Secondary | ICD-10-CM | POA: Diagnosis not present

## 2016-06-15 ENCOUNTER — Encounter: Payer: BLUE CROSS/BLUE SHIELD | Admitting: Physical Therapy

## 2016-06-16 ENCOUNTER — Ambulatory Visit: Payer: Medicare Other | Admitting: Physical Therapy

## 2016-06-16 ENCOUNTER — Encounter: Payer: Self-pay | Admitting: Physical Therapy

## 2016-06-16 DIAGNOSIS — R2681 Unsteadiness on feet: Secondary | ICD-10-CM

## 2016-06-16 DIAGNOSIS — Z89611 Acquired absence of right leg above knee: Secondary | ICD-10-CM | POA: Diagnosis not present

## 2016-06-16 DIAGNOSIS — M6281 Muscle weakness (generalized): Secondary | ICD-10-CM | POA: Diagnosis not present

## 2016-06-16 DIAGNOSIS — R296 Repeated falls: Secondary | ICD-10-CM | POA: Diagnosis not present

## 2016-06-16 DIAGNOSIS — R2689 Other abnormalities of gait and mobility: Secondary | ICD-10-CM

## 2016-06-16 DIAGNOSIS — M25651 Stiffness of right hip, not elsewhere classified: Secondary | ICD-10-CM | POA: Diagnosis not present

## 2016-06-17 DIAGNOSIS — Z7952 Long term (current) use of systemic steroids: Secondary | ICD-10-CM | POA: Diagnosis not present

## 2016-06-17 DIAGNOSIS — Z940 Kidney transplant status: Secondary | ICD-10-CM | POA: Diagnosis not present

## 2016-06-17 DIAGNOSIS — Z4822 Encounter for aftercare following kidney transplant: Secondary | ICD-10-CM | POA: Diagnosis not present

## 2016-06-17 DIAGNOSIS — Z89511 Acquired absence of right leg below knee: Secondary | ICD-10-CM | POA: Diagnosis not present

## 2016-06-17 DIAGNOSIS — D899 Disorder involving the immune mechanism, unspecified: Secondary | ICD-10-CM | POA: Diagnosis not present

## 2016-06-17 DIAGNOSIS — E1122 Type 2 diabetes mellitus with diabetic chronic kidney disease: Secondary | ICD-10-CM | POA: Diagnosis not present

## 2016-06-17 DIAGNOSIS — D649 Anemia, unspecified: Secondary | ICD-10-CM | POA: Diagnosis not present

## 2016-06-17 DIAGNOSIS — N186 End stage renal disease: Secondary | ICD-10-CM | POA: Diagnosis not present

## 2016-06-17 DIAGNOSIS — Z7984 Long term (current) use of oral hypoglycemic drugs: Secondary | ICD-10-CM | POA: Diagnosis not present

## 2016-06-17 DIAGNOSIS — I10 Essential (primary) hypertension: Secondary | ICD-10-CM | POA: Diagnosis not present

## 2016-06-17 DIAGNOSIS — Z7982 Long term (current) use of aspirin: Secondary | ICD-10-CM | POA: Diagnosis not present

## 2016-06-17 DIAGNOSIS — Z79899 Other long term (current) drug therapy: Secondary | ICD-10-CM | POA: Diagnosis not present

## 2016-06-17 DIAGNOSIS — I120 Hypertensive chronic kidney disease with stage 5 chronic kidney disease or end stage renal disease: Secondary | ICD-10-CM | POA: Diagnosis not present

## 2016-06-17 DIAGNOSIS — D8989 Other specified disorders involving the immune mechanism, not elsewhere classified: Secondary | ICD-10-CM | POA: Diagnosis not present

## 2016-06-17 DIAGNOSIS — E119 Type 2 diabetes mellitus without complications: Secondary | ICD-10-CM | POA: Diagnosis not present

## 2016-06-17 DIAGNOSIS — D631 Anemia in chronic kidney disease: Secondary | ICD-10-CM | POA: Diagnosis not present

## 2016-06-18 NOTE — Therapy (Signed)
[facility] [address] [place], [place], [address] Phone: [phone]   Fax:  [phone]  Physical Therapy Treatment  Patient Details  Name: Steven Logan MRN: [id number] Date of Birth: 06-27-1970 Referring Provider: Mauricia Area, MD  Encounter Date: 06/16/2016     06/16/16 1515  PT Visits / Re-Eval  Visit Number 7  Number of Visits 15  Date for PT Re-Evaluation 08/13/16  Authorization  Authorization Type Medicare G-code & progress note every 10 visits  PT Time Calculation  PT Start Time 1400  PT Stop Time 1445  PT Time Calculation (min) 45 min  PT - End of Session  Equipment Utilized During Treatment Gait belt  Activity Tolerance Patient tolerated treatment well  Behavior During Therapy [facility] for tasks assessed/performed    Past Medical History:  Diagnosis Date  . Abscess    great toe  . Anemia   . Arthritis   . Chronic kidney disease    esrd  . Depression   . Diabetes mellitus   . Dyslipidemia   . Eczema   . Gout   . HTN (hypertension)   . Morbid obesity ([place])   . Pancreatitis   . Pneumonia    2-3 years ago  . Pulmonary embolism ([place])    3  in  lungs  at  one time...  . Renal hypertension    Hemo started  Sept 2014- MWF  . Shortness of breath    ?? chest  cold he has now.  10/8- no SOB    Past Surgical History:  Procedure Laterality Date  . AMPUTATION Right 06/12/2013   Procedure: Transtibial Amputation;  Surgeon: Newt Minion, MD;  Location: [facility];  Service: Orthopedics;  Laterality: Right;  . AMPUTATION Right 06/27/2013   Procedure: AMPUTATION ABOVE KNEE;  Surgeon: Newt Minion, MD;  Location: [facility];  Service: Orthopedics;  Laterality: Right;  Right Above Knee Amputation  . AV FISTULA PLACEMENT Left 04/07/2012   Procedure: ARTERIOVENOUS (AV) FISTULA CREATION;  Surgeon: Angelia Mould, MD;  Location: [facility];  Service: Vascular;  Laterality: Left;  . FISTULOGRAM Left 08/07/2012   Procedure: FISTULOGRAM;  Surgeon: Angelia Mould, MD;  Location: [facility] CATH LAB;  Service: Cardiovascular;  Laterality: Left;  arm  . I&D EXTREMITY Right 06/02/2013   Procedure: IRRIGATION AND DEBRIDEMENT ARTHROSCOPIC RIGHT ANKLE;  Surgeon: Augustin Schooling, MD;  Location: [facility];  Service: Orthopedics;  Laterality: Right;  . I&D EXTREMITY Right 06/04/2013   Procedure: IRRIGATION AND DEBRIDEMENT RIGHT FOOT/ANKLE;  Surgeon: Newt Minion, MD;  Location: [facility];  Service: Orthopedics;  Laterality: Right;  . I&D EXTREMITY Right 06/06/2013   Procedure: IRRIGATION AND DEBRIDEMENT EXTREMITY;  Surgeon: Newt Minion, MD;  Location: [facility];  Service: Orthopedics;  Laterality: Right;  . I&D EXTREMITY Right 06/24/2013   Procedure: IRRIGATION AND DEBRIDEMENT and Revsion of TRANSTIBIAL AMPUTATION;  Surgeon: Newt Minion, MD;  Location: [facility];  Service: Orthopedics;  Laterality: Right;  . INSERTION OF DIALYSIS CATHETER Right 09/19/2012   Procedure: INSERTION OF DIALYSIS CATHETER;  Surgeon: Angelia Mould, MD;  Location: [facility];  Service: Vascular;  Laterality: Right;  . LIGATION OF COMPETING BRANCHES OF ARTERIOVENOUS FISTULA Left 09/19/2012   Procedure: LIGATION OF COMPETING BRANCHES OF LEFT ARM ARTERIOVENOUS FISTULA; Vein angioplasty;  Surgeon: Angelia Mould, MD;  Location: [facility];  Service: Vascular;  Laterality: Left;  . none      There were no vitals filed for  this visit.     06/16/16 1402  Symptoms/Limitations  Subjective He recieved kidney transplant on 05/25/2016. He has been cleared to resume PT. He anticipates returning to work at 3 months.   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   Pain Assessment  Currently in Pain? Yes  Pain Score 5  Pain Location Abdomen  Pain  Orientation Right  Pain Descriptors / Indicators Sore;Sharp  Pain Type Surgical pain  Pain Onset 1 to 4 weeks ago  Pain Frequency Constant  Aggravating Factors  kidney transplant sg  Pain Relieving Factors medications.   Effect of Pain on Daily Activities limiting standing & walking  Multiple Pain Sites No            Prosthetics Assessment - 06/17/16 1400      Prosthetics   Prosthetic Care Independent with Skin check;Residual limb care;Prosthetic cleaning;Proper wear schedule/adjustment   Prosthetic Care Dependent with Correct ply sock adjustment   Prosthetic Care Comments  Patient has lost 10# since kidney transplant and suction suspension is inconsistent. PT instructed pt & spoke with prosthetist on adding pads. Once weight loss levels out, he will probably need a socket revision at that time with excessive weight loss.    Current prosthetic wear tolerance (days/week)  7 days /wk   Current prosthetic wear tolerance (#hours/day)  all awake hours   Residual limb condition  intact, He has steri-strips over abdominal incision from kidney transplant surgery. He reports proximal prosthesis causing abdominal pressure in sitting.    K code/activity level with prosthetic use  K3 full community with variable cadence          06/16/16 1400  Berg Balance Test  Sit to Stand 4  Standing Unsupported 4  Sitting with Back Unsupported but Feet Supported on Floor or Stool 4  Stand to Sit 4  Transfers 4  Standing Unsupported with Eyes Closed 4  Standing Ubsupported with Feet Together 4  From Standing, Reach Forward with Outstretched Arm 4  From Standing Position, Pick up Object from Floor 3  From Standing Position, Turn to Look Behind Over each Shoulder 4  Turn 360 Degrees 2  Standing Unsupported, Alternately Place Feet on Step/Stool 4  Standing Unsupported, One Foot in Front 3  Standing on One Leg 2  Total Score 50  Functional Gait  Assessment  Gait assessed  Yes  Gait Level Surface  2  Change in Gait Speed 2  Gait with Horizontal Head Turns 2  Gait with Vertical Head Turns 2  Gait and Pivot Turn 3  Step Over Obstacle 2  Gait with Narrow Base of Support 1  Gait with Eyes Closed 2  Ambulating Backwards 2  Steps 1  Total Score 19      06/17/16 1400  Prosthetics  Prosthetic Care Independent with Skin check;Residual limb care;Prosthetic cleaning;Proper wear schedule/adjustment  Prosthetic Care Dependent with Correct ply sock adjustment  Prosthetic Care Comments  Patient has lost 10# since kidney transplant and suction suspension is inconsistent. PT instructed pt & spoke with prosthetist on adding pads. Once weight loss levels out, he will probably need a socket revision at that time with excessive weight loss.   Current prosthetic wear tolerance (days/week)  7 days /wk  Current prosthetic wear tolerance (#hours/day)  all awake hours  Residual limb condition  intact, He has steri-strips over  abdominal incision from kidney transplant surgery. He reports proximal prosthesis causing abdominal pressure in sitting.   K code/activity level with prosthetic use  K3 full community with variable cadence      06/17/16 1400  Transfers  Transfers Sit to Stand;Stand to Sit  Sit to Stand 5: Supervision;With upper extremity assist;From chair/3-in-1;With armrests  Sit to Stand Details (indicate cue type and reason) cues on engaging prosthetic knee upon arising  Stand to Sit 5: Supervision;With upper extremity assist;With armrests;To chair/3-in-1  Stand to Sit Details verbal cues on engaging prosthetic knee  Ambulation/Gait  Ambulation/Gait Yes  Ambulation/Gait Assistance 5: Supervision  Ambulation/Gait Assistance Details visual & verbal cues on proper step width (not abducted) and prosthetic knee flexion in terminal stance  Ambulation Distance (Feet) 800 Feet  Assistive device Prosthesis;None  Ambulation Surface Indoor;Level  Stairs Yes  Stairs Assistance 5: Supervision  Stairs  Assistance Details (indicate cue type and reason) descend step-to riding prosthetic hydraulics  Stair Management Technique Two rails;Step to pattern;Forwards  Number of Stairs 4  Therapeutic Activites   Therapeutic Activities Lifting  Lifting tactile & verbal cues to lift & place items on floor  Prosthetics  Prosthetic Care Comments  Patient has lost 10# since kidney transplant and suction suspension is inconsistent. PT instructed pt & spoke with prosthetist on adding pads. Once weight loss levels out, he will probably need a socket revision at that time with excessive weight loss.   Current prosthetic wear tolerance (days/week)  7 days /wk  Current prosthetic wear tolerance (#hours/day)  all awake hours  Residual limb condition  intact, He has steri-strips over abdominal incision from kidney transplant surgery. He reports proximal prosthesis causing abdominal pressure in sitting.              [facility] Adult PT Treatment/Exercise - 06/17/16 1400      Transfers   Transfers Sit to Stand;Stand to Sit   Sit to Stand 5: Supervision;With upper extremity assist;From chair/3-in-1;With armrests   Sit to Stand Details (indicate cue type and reason) cues on engaging prosthetic knee upon arising   Stand to Sit 5: Supervision;With upper extremity assist;With armrests;To chair/3-in-1   Stand to Sit Details verbal cues on engaging prosthetic knee     Ambulation/Gait   Ambulation/Gait Yes   Ambulation/Gait Assistance 5: Supervision   Ambulation/Gait Assistance Details visual & verbal cues on proper step width (not abducted) and prosthetic knee flexion in terminal stance   Ambulation Distance (Feet) 800 Feet   Assistive device Prosthesis;None   Ambulation Surface Indoor;Level   Stairs Yes   Stairs Assistance 5: Supervision   Stairs Assistance Details (indicate cue type and reason) descend step-to riding prosthetic hydraulics   Stair Management Technique Two rails;Step to pattern;Forwards   Number  of Stairs 4     Therapeutic Activites    Therapeutic Activities Lifting   Lifting tactile & verbal cues to lift & place items on floor                  PT Short Term Goals - 06/17/16 1810      PT SHORT TERM GOAL #1   Title Patient verbalizes & demonstrates understanding of updated HEP. (Target Date: 07/16/2016)   Time 4   Period Weeks   Status On-going     PT SHORT TERM GOAL #2   Title Patient ambulates with head turns to scan environment maintaining path & pace with cues. (Target Date: 07/16/2016)   Time 4   Period Weeks  Status On-going     PT SHORT TERM GOAL #3   Title Four-Square step test <14 sec to indicate lower fall risk.  (Target Date: 07/16/2016)   Time 4   Period Weeks   Status On-going     PT SHORT TERM GOAL #4   Title Patient demonstrates proper step position & weight shift with direction changes with verbal cues only from PT. (Target Date: 07/16/2016)   Time 4   Period Weeks   Status On-going     PT SHORT TERM GOAL #5   Title Patient descends stairs with 2 rails reciprocally with prosthetic knee control. (Target Date: 07/16/2016)   Time 4   Period Weeks   Status On-going           PT Long Term Goals - 06/17/16 1813      PT LONG TERM GOAL #1   Title Patient ambulates at comfortable gait velocity >4.0 ft/sec and increases to fast pace >5.0 ft/sec safely. (Target Date: 08/15/2016)   Time 8   Period Weeks   Status On-going     PT LONG TERM GOAL #2   Title Patient negotiates ramps & curbs with prosthesis only and descend stairs with 1 rail modified independent.   (Target Date: 08/15/2016)   Time 8   Period Weeks   Status On-going     PT LONG TERM GOAL #3   Title Berg Balance >54/56 to decrease fall risk  (Target Date: 08/15/2016)   Time 8   Period Weeks   Status On-going     PT LONG TERM GOAL #4   Title Functional Gait Assessment >25/30 to decrease fall risk.  (Target Date: 08/15/2016)   Time 8   Period Weeks   Status On-going     PT  LONG TERM GOAL #5   Title Patient lifts & carries 25# 50', carries grocery bag on stairs with 1 rail and performs work simulation tasks safely, independently. (Target Date: 08/15/2016)   Time 8   Period Weeks   Status On-going         06/16/16 1815  Plan  Clinical Impression Statement Patient underwent major surgery of kidney transplant which has limited PT visits and his activity level. He still has potential to function with microprocessor prosthesis at full community level with lower fall risk with skilled instruction.   Pt will benefit from skilled therapeutic intervention in order to improve on the following deficits Abnormal gait;Decreased activity tolerance;Decreased balance;Decreased mobility;Decreased safety awareness;Postural dysfunction;Prosthetic Dependency;Decreased range of motion  Rehab Potential Good  PT Frequency 1x / week  PT Duration 8 weeks  PT Treatment/Interventions ADLs/Self Care Home Management;Gait training;Stair training;Functional mobility training;Therapeutic activities;Therapeutic exercise;Balance training;Neuromuscular re-education;Patient/family education;Prosthetic Training  PT Next Visit Plan prosthetic training towards updated STGs  Consulted and Agree with Plan of Care Patient         Patient will benefit from skilled therapeutic intervention in order to improve the following deficits and impairments:  Abnormal gait, Decreased activity tolerance, Decreased balance, Decreased mobility, Decreased safety awareness, Postural dysfunction, Prosthetic Dependency, Decreased range of motion  Visit Diagnosis: Other abnormalities of gait and mobility  Unsteadiness on feet  Repeated falls  Status post above knee amputation of right lower extremity (HCC)  Stiffness of right hip, not elsewhere classified  Muscle weakness (generalized)     Problem List Patient Active Problem List   Diagnosis Date Noted  . Acute blood loss anemia 06/25/2013  .  Hyperglycemia 06/07/2013  . Arm DVT (deep venous thromboembolism), acute (  [place]) 06/07/2013  . ESRD on dialysis ([place]) 06/05/2013  . Encephalopathy, toxic 06/03/2013  . Cellulitis and abscess of leg 06/03/2013  . Anemia in chronic kidney disease 06/03/2013  . Arm edema 06/03/2013  . Leg abscess 06/03/2013  . Severe sepsis ([place]) 06/03/2013  . Septic shock ([place]) 06/03/2013  . Septic arthritis of ankle or foot, right 06/03/2013  . Abscess of right lower leg 06/02/2013  . Wrist lump 07/05/2012  . Swelling of limb-Left index and middle fingers 07/05/2012  . Aftercare following surgery of the circulatory system, [place] 07/05/2012  . Tingling-left wrist and some pain. 07/05/2012  . Pulmonary edema 05/04/2012  . Acute respiratory failure ([place]) 05/04/2012  . CKD (chronic kidney disease) stage 5, GFR less than 15 ml/min (HCC) 05/04/2012  . Hyperkalemia 05/04/2012  . Pre-operative cardiovascular examination 03/15/2012  . Iron deficiency anemia 09/22/2010  . HTN (hypertension) 09/22/2010  . Hyperlipidemia 09/22/2010  . Gout 09/22/2010  . CRI (chronic renal insufficiency) 09/22/2010  . Pulmonary embolism, bilateral ([place]) 09/22/2010    Appolonia Ackert PT, DPT 06/18/2016, 11:20 AM  [facility] [address] [place], [place], [address] Phone: [phone]   Fax:  [phone]  Name: COLLIN RENGEL MRN: [id number] Date of Birth: Jan 04, 1971

## 2016-06-22 ENCOUNTER — Ambulatory Visit: Payer: Medicare Other | Admitting: Physical Therapy

## 2016-06-22 DIAGNOSIS — Z48288 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: Secondary | ICD-10-CM | POA: Insufficient documentation

## 2016-06-22 DIAGNOSIS — Z955 Presence of coronary angioplasty implant and graft: Secondary | ICD-10-CM | POA: Diagnosis not present

## 2016-06-22 DIAGNOSIS — E119 Type 2 diabetes mellitus without complications: Secondary | ICD-10-CM | POA: Diagnosis not present

## 2016-06-22 DIAGNOSIS — Z7952 Long term (current) use of systemic steroids: Secondary | ICD-10-CM | POA: Diagnosis not present

## 2016-06-22 DIAGNOSIS — Z79899 Other long term (current) drug therapy: Secondary | ICD-10-CM | POA: Diagnosis not present

## 2016-06-22 DIAGNOSIS — D8989 Other specified disorders involving the immune mechanism, not elsewhere classified: Secondary | ICD-10-CM | POA: Diagnosis not present

## 2016-06-22 DIAGNOSIS — Z4822 Encounter for aftercare following kidney transplant: Secondary | ICD-10-CM | POA: Diagnosis not present

## 2016-06-22 DIAGNOSIS — I120 Hypertensive chronic kidney disease with stage 5 chronic kidney disease or end stage renal disease: Secondary | ICD-10-CM | POA: Diagnosis not present

## 2016-06-22 DIAGNOSIS — E1122 Type 2 diabetes mellitus with diabetic chronic kidney disease: Secondary | ICD-10-CM | POA: Diagnosis not present

## 2016-06-22 DIAGNOSIS — Z794 Long term (current) use of insulin: Secondary | ICD-10-CM | POA: Diagnosis not present

## 2016-06-22 DIAGNOSIS — Z992 Dependence on renal dialysis: Secondary | ICD-10-CM | POA: Diagnosis not present

## 2016-06-22 DIAGNOSIS — I10 Essential (primary) hypertension: Secondary | ICD-10-CM | POA: Diagnosis not present

## 2016-06-22 DIAGNOSIS — N2889 Other specified disorders of kidney and ureter: Secondary | ICD-10-CM | POA: Diagnosis not present

## 2016-06-22 DIAGNOSIS — Z940 Kidney transplant status: Secondary | ICD-10-CM | POA: Diagnosis not present

## 2016-06-22 DIAGNOSIS — D649 Anemia, unspecified: Secondary | ICD-10-CM | POA: Diagnosis not present

## 2016-06-22 DIAGNOSIS — Z7984 Long term (current) use of oral hypoglycemic drugs: Secondary | ICD-10-CM | POA: Diagnosis not present

## 2016-06-22 DIAGNOSIS — D899 Disorder involving the immune mechanism, unspecified: Secondary | ICD-10-CM | POA: Diagnosis not present

## 2016-06-22 DIAGNOSIS — D631 Anemia in chronic kidney disease: Secondary | ICD-10-CM | POA: Diagnosis not present

## 2016-06-22 DIAGNOSIS — Z7982 Long term (current) use of aspirin: Secondary | ICD-10-CM | POA: Diagnosis not present

## 2016-06-22 DIAGNOSIS — N186 End stage renal disease: Secondary | ICD-10-CM | POA: Diagnosis not present

## 2016-06-23 ENCOUNTER — Ambulatory Visit: Payer: Medicare Other | Admitting: Physical Therapy

## 2016-06-23 ENCOUNTER — Encounter: Payer: Self-pay | Admitting: Physical Therapy

## 2016-06-23 DIAGNOSIS — M6281 Muscle weakness (generalized): Secondary | ICD-10-CM

## 2016-06-23 DIAGNOSIS — M25651 Stiffness of right hip, not elsewhere classified: Secondary | ICD-10-CM | POA: Diagnosis not present

## 2016-06-23 DIAGNOSIS — R296 Repeated falls: Secondary | ICD-10-CM

## 2016-06-23 DIAGNOSIS — R2681 Unsteadiness on feet: Secondary | ICD-10-CM | POA: Diagnosis not present

## 2016-06-23 DIAGNOSIS — R2689 Other abnormalities of gait and mobility: Secondary | ICD-10-CM

## 2016-06-23 DIAGNOSIS — Z89611 Acquired absence of right leg above knee: Secondary | ICD-10-CM | POA: Diagnosis not present

## 2016-06-23 NOTE — Therapy (Signed)
[facility] [address] [place], [place], [address] Phone: [phone]   Fax:  [phone]  Physical Therapy Treatment  Patient Details  Name: Steven Logan MRN: [id number] Date of Birth: 1970-09-01 Referring Provider: Mauricia Area, MD  Encounter Date: 06/23/2016      PT End of Session - 06/23/16 1509    Visit Number 8   Number of Visits 15   Date for PT Re-Evaluation 08/13/16   Authorization Type Medicare G-code & progress note every 10 visits   PT Start Time 1315   PT Stop Time 1400   PT Time Calculation (min) 45 min   Activity Tolerance Patient tolerated treatment well   Behavior During Therapy [facility] for tasks assessed/performed      Past Medical History:  Diagnosis Date  . Abscess    great toe  . Anemia   . Arthritis   . Chronic kidney disease    esrd  . Depression   . Diabetes mellitus   . Dyslipidemia   . Eczema   . Gout   . HTN (hypertension)   . Morbid obesity ([place])   . Pancreatitis   . Pneumonia    2-3 years ago  . Pulmonary embolism ([place])    3  in  lungs  at  one time...  . Renal hypertension    Hemo started  Sept 2014- MWF  . Shortness of breath    ?? chest  cold he has now.  10/8- no SOB    Past Surgical History:  Procedure Laterality Date  . AMPUTATION Right 06/12/2013   Procedure: Transtibial Amputation;  Surgeon: Newt Minion, MD;  Location: [facility];  Service: Orthopedics;  Laterality: Right;  . AMPUTATION Right 06/27/2013   Procedure: AMPUTATION ABOVE KNEE;  Surgeon: Newt Minion, MD;  Location: [facility];  Service: Orthopedics;  Laterality: Right;  Right Above Knee Amputation  . AV FISTULA PLACEMENT Left 04/07/2012   Procedure: ARTERIOVENOUS (AV) FISTULA CREATION;  Surgeon: Angelia Mould, MD;  Location: [place];  Service: Vascular;  Laterality: Left;  . FISTULOGRAM Left 08/07/2012   Procedure: FISTULOGRAM;  Surgeon: Angelia Mould, MD;  Location: [facility] CATH LAB;  Service:  Cardiovascular;  Laterality: Left;  arm  . I&D EXTREMITY Right 06/02/2013   Procedure: IRRIGATION AND DEBRIDEMENT ARTHROSCOPIC RIGHT ANKLE;  Surgeon: Augustin Schooling, MD;  Location: [facility];  Service: Orthopedics;  Laterality: Right;  . I&D EXTREMITY Right 06/04/2013   Procedure: IRRIGATION AND DEBRIDEMENT RIGHT FOOT/ANKLE;  Surgeon: Newt Minion, MD;  Location: [place];  Service: Orthopedics;  Laterality: Right;  . I&D EXTREMITY Right 06/06/2013   Procedure: IRRIGATION AND DEBRIDEMENT EXTREMITY;  Surgeon: Newt Minion, MD;  Location: [facility];  Service: Orthopedics;  Laterality: Right;  . I&D EXTREMITY Right 06/24/2013   Procedure: IRRIGATION AND DEBRIDEMENT and Revsion of TRANSTIBIAL AMPUTATION;  Surgeon: Newt Minion, MD;  Location: [facility];  Service: Orthopedics;  Laterality: Right;  . INSERTION OF DIALYSIS CATHETER Right 09/19/2012   Procedure: INSERTION OF DIALYSIS CATHETER;  Surgeon: Angelia Mould, MD;  Location: [facility];  Service: Vascular;  Laterality: Right;  . LIGATION OF COMPETING BRANCHES OF ARTERIOVENOUS FISTULA Left 09/19/2012   Procedure: LIGATION OF COMPETING BRANCHES OF LEFT ARM ARTERIOVENOUS FISTULA; Vein angioplasty;  Surgeon: Angelia Mould, MD;  Location: [facility];  Service: Vascular;  Laterality: Left;  . none      There were no vitals filed for this visit.  Subjective Assessment - 06/23/16 1322    Subjective Patient reports he has appointment with prosthetist Gerald Stabs) on Friday (06/25/2016) to get socks and/or pads to adjust for weight loss following kidney transplant. Patient reports a near fall when descending full flight of stairs.    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    Currently in Pain? Yes   Pain Score 5    Pain Location Abdomen   Pain  Orientation Right   Pain Descriptors / Indicators Sore   Pain Type Surgical pain   Pain Onset 1 to 4 weeks ago   Aggravating Factors  kidney transplant surgery                          [facility] Adult PT Treatment/Exercise - 06/23/16 1315      Transfers   Transfers Sit to Stand;Stand to Sit   Sit to Stand 5: Supervision;With upper extremity assist;From chair/3-in-1;With armrests   Stand to Sit 5: Supervision;With upper extremity assist;With armrests;To chair/3-in-1     Ambulation/Gait   Ambulation/Gait Yes   Ambulation/Gait Assistance 5: Supervision   Ambulation/Gait Assistance Details requires cueing to decrease step width    Ambulation Distance (Feet) 200 Feet   Assistive device Prosthesis;None   Gait Pattern Step-through pattern;Decreased arm swing - right;Decreased step length - left;Decreased stance time - right;Decreased stride length;Decreased hip/knee flexion - right;Decreased weight shift to right;Right hip hike;Trunk flexed;Abducted- right;Lateral trunk lean to right   Ambulation Surface Level;Indoor   Stairs Yes   Stairs Assistance 5: Supervision   Stairs Assistance Details (indicate cue type and reason) Requires demo and cueing sequencing and timing for hydraulic knee engagement. PT utilized visual cues on steps for foot placement, which aided in proper hydraulic knee activation. PT introduced descending stairs with 1 rail on L side with PT providing min guard for safety and tactile cueing for proper sequencing.    Stair Management Technique Two rails;Step to pattern;Forwards;One rail Left   Number of Stairs 4   Gait Comments In parallel bars: PT instructed patient in performing forward step from 4 inch block onto 12 degree incline on R side. Patient requires demo and cueing for sequencing and technique for hydraulic knee activation and visual feedback (mirror). Patient requires BUE support.     High Level Balance   High Level Balance Activities Weight-shifting  turns   High Level Balance Comments At countertop: PT instructed patient in performing weight shifting turns with a pivot step utilizing color discs on the floor for visual cues for foot placement. Progressed from pivot stepping alone to pivot step + multiple forward steps. Requires demo and cueing for technique, sequencing and posture.      Therapeutic Activites    Therapeutic Activities --   Lifting --     Neuro Re-ed    Neuro Re-ed Details  4-square stepping with countertop behind him and two chair backs on both sides: PT instructed patient in performing stepping in multiple directions without UE support. Patient required cueing for technique and visual feedback (mirror) for proper foot placement.     Prosthetics   Prosthetic Care Comments  Patient reports he has a scheduled appointment with prosthetist on Friday (06/25/2016) to add pads due to weight loss following kidney transplant.   Current prosthetic wear tolerance (days/week)  7 days /  wk   Current prosthetic wear tolerance (#hours/day)  all awake hours   Residual limb condition  intact, He has steri-strips over abdominal incision from kidney transplant surgery. He reports proximal prosthesis causing abdominal pressure in sitting.                 PT Education - 06/23/16 1400    Education provided Yes   Education Details pivot turning and 4-square stepping at countertop, descending stairs with 2 rails with emphasis on hydraulic knee engagement    Person(s) Educated Patient   Methods Explanation;Demonstration;Tactile cues;Verbal cues   Comprehension Verbalized understanding;Returned demonstration;Verbal cues required;Tactile cues required          PT Short Term Goals - 06/17/16 1810      PT SHORT TERM GOAL #1   Title Patient verbalizes & demonstrates understanding of updated HEP. (Target Date: 07/16/2016)   Time 4   Period Weeks   Status On-going     PT SHORT TERM GOAL #2   Title Patient ambulates with head turns to  scan environment maintaining path & pace with cues. (Target Date: 07/16/2016)   Time 4   Period Weeks   Status On-going     PT SHORT TERM GOAL #3   Title Four-Square step test <14 sec to indicate lower fall risk.  (Target Date: 07/16/2016)   Time 4   Period Weeks   Status On-going     PT SHORT TERM GOAL #4   Title Patient demonstrates proper step position & weight shift with direction changes with verbal cues only from PT. (Target Date: 07/16/2016)   Time 4   Period Weeks   Status On-going     PT SHORT TERM GOAL #5   Title Patient descends stairs with 2 rails reciprocally with prosthetic knee control. (Target Date: 07/16/2016)   Time 4   Period Weeks   Status On-going           PT Long Term Goals - 06/17/16 1813      PT LONG TERM GOAL #1   Title Patient ambulates at comfortable gait velocity >4.0 ft/sec and increases to fast pace >5.0 ft/sec safely. (Target Date: 08/15/2016)   Time 8   Period Weeks   Status On-going     PT LONG TERM GOAL #2   Title Patient negotiates ramps & curbs with prosthesis only and descend stairs with 1 rail modified independent.   (Target Date: 08/15/2016)   Time 8   Period Weeks   Status On-going     PT LONG TERM GOAL #3   Title Berg Balance >54/56 to decrease fall risk  (Target Date: 08/15/2016)   Time 8   Period Weeks   Status On-going     PT LONG TERM GOAL #4   Title Functional Gait Assessment >25/30 to decrease fall risk.  (Target Date: 08/15/2016)   Time 8   Period Weeks   Status On-going     PT LONG TERM GOAL #5   Title Patient lifts & carries 25# 50', carries grocery bag on stairs with 1 rail and performs work simulation tasks safely, independently. (Target Date: 08/15/2016)   Time 8   Period Weeks   Status On-going               Plan - 06/23/16 1351    Clinical Impression Statement Today's skilled PT session focused on advancing patient's prosthetic gait training by introducing pivot turning, stepping on an incline, and  descending stairs to foster proper activation of  hydraulic knee. Patient is making progress towards goals, and will benefit from continued skilled PT to address functional mobility deficits.    Rehab Potential Good   PT Frequency 1x / week   PT Duration 8 weeks   PT Treatment/Interventions ADLs/Self Care Home Management;Gait training;Stair training;Functional mobility training;Therapeutic activities;Therapeutic exercise;Balance training;Neuromuscular re-education;Patient/family education;Prosthetic Training   PT Next Visit Plan prosthetic training on stairs with 1 rail; prosthetic training working towards STGs, prosthetic gait + head turns/nods/diagonals    Consulted and Agree with Plan of Care Patient      Patient will benefit from skilled therapeutic intervention in order to improve the following deficits and impairments:  Abnormal gait, Decreased activity tolerance, Decreased balance, Decreased mobility, Decreased safety awareness, Postural dysfunction, Prosthetic Dependency, Decreased range of motion  Visit Diagnosis: Other abnormalities of gait and mobility  Unsteadiness on feet  Repeated falls  Muscle weakness (generalized)     Problem List Patient Active Problem List   Diagnosis Date Noted  . Acute blood loss anemia 06/25/2013  . Hyperglycemia 06/07/2013  . Arm DVT (deep venous thromboembolism), acute ([place]) 06/07/2013  . ESRD on dialysis ([place]) 06/05/2013  . Encephalopathy, toxic 06/03/2013  . Cellulitis and abscess of leg 06/03/2013  . Anemia in chronic kidney disease 06/03/2013  . Arm edema 06/03/2013  . Leg abscess 06/03/2013  . Severe sepsis ([place]) 06/03/2013  . Septic shock ([place]) 06/03/2013  . Septic arthritis of ankle or foot, right 06/03/2013  . Abscess of right lower leg 06/02/2013  . Wrist lump 07/05/2012  . Swelling of limb-Left index and middle fingers 07/05/2012  . Aftercare following surgery of the circulatory system, [place] 07/05/2012  . Tingling-left wrist  and some pain. 07/05/2012  . Pulmonary edema 05/04/2012  . Acute respiratory failure ([place]) 05/04/2012  . CKD (chronic kidney disease) stage 5, GFR less than 15 ml/min (H[facility]) 05/04/2012  . Hyperkalemia 05/04/2012  . Pre-operative cardiovascular examination 03/15/2012  . Iron deficiency anemia 09/22/2010  . HTN (hypertension) 09/22/2010  . Hyperlipidemia 09/22/2010  . Gout 09/22/2010  . CRI (chronic renal insufficiency) 09/22/2010  . Pulmonary embolism, bilateral ([place]) 09/22/2010    Arelia Sneddon, SPT  06/23/2016, 3:12 PM  [facility] [address] [place], [place], [address] Phone: [phone]   Fax:  [phone]  Name: Steven Logan MRN: [id number] Date of Birth: [birth year]

## 2016-06-24 DIAGNOSIS — Z466 Encounter for fitting and adjustment of urinary device: Secondary | ICD-10-CM | POA: Diagnosis not present

## 2016-06-24 DIAGNOSIS — D8989 Other specified disorders involving the immune mechanism, not elsewhere classified: Secondary | ICD-10-CM | POA: Diagnosis not present

## 2016-06-24 DIAGNOSIS — N186 End stage renal disease: Secondary | ICD-10-CM | POA: Diagnosis not present

## 2016-06-24 DIAGNOSIS — Z7982 Long term (current) use of aspirin: Secondary | ICD-10-CM | POA: Diagnosis not present

## 2016-06-24 DIAGNOSIS — Z79899 Other long term (current) drug therapy: Secondary | ICD-10-CM | POA: Diagnosis not present

## 2016-06-24 DIAGNOSIS — I10 Essential (primary) hypertension: Secondary | ICD-10-CM | POA: Diagnosis not present

## 2016-06-24 DIAGNOSIS — E1122 Type 2 diabetes mellitus with diabetic chronic kidney disease: Secondary | ICD-10-CM | POA: Diagnosis not present

## 2016-06-24 DIAGNOSIS — Z992 Dependence on renal dialysis: Secondary | ICD-10-CM | POA: Diagnosis not present

## 2016-06-24 DIAGNOSIS — E119 Type 2 diabetes mellitus without complications: Secondary | ICD-10-CM | POA: Diagnosis not present

## 2016-06-24 DIAGNOSIS — D899 Disorder involving the immune mechanism, unspecified: Secondary | ICD-10-CM | POA: Diagnosis not present

## 2016-06-24 DIAGNOSIS — Z940 Kidney transplant status: Secondary | ICD-10-CM | POA: Diagnosis not present

## 2016-06-24 DIAGNOSIS — Z4822 Encounter for aftercare following kidney transplant: Secondary | ICD-10-CM | POA: Diagnosis not present

## 2016-06-24 DIAGNOSIS — I120 Hypertensive chronic kidney disease with stage 5 chronic kidney disease or end stage renal disease: Secondary | ICD-10-CM | POA: Diagnosis not present

## 2016-06-24 DIAGNOSIS — Z794 Long term (current) use of insulin: Secondary | ICD-10-CM | POA: Diagnosis not present

## 2016-06-29 ENCOUNTER — Ambulatory Visit: Payer: Medicare Other | Admitting: Physical Therapy

## 2016-06-29 DIAGNOSIS — D8989 Other specified disorders involving the immune mechanism, not elsewhere classified: Secondary | ICD-10-CM | POA: Diagnosis not present

## 2016-06-29 DIAGNOSIS — N186 End stage renal disease: Secondary | ICD-10-CM | POA: Diagnosis not present

## 2016-06-29 DIAGNOSIS — D649 Anemia, unspecified: Secondary | ICD-10-CM | POA: Diagnosis not present

## 2016-06-29 DIAGNOSIS — I120 Hypertensive chronic kidney disease with stage 5 chronic kidney disease or end stage renal disease: Secondary | ICD-10-CM | POA: Diagnosis not present

## 2016-06-29 DIAGNOSIS — Z4822 Encounter for aftercare following kidney transplant: Secondary | ICD-10-CM | POA: Diagnosis not present

## 2016-06-29 DIAGNOSIS — D631 Anemia in chronic kidney disease: Secondary | ICD-10-CM | POA: Diagnosis not present

## 2016-06-29 DIAGNOSIS — E1122 Type 2 diabetes mellitus with diabetic chronic kidney disease: Secondary | ICD-10-CM | POA: Diagnosis not present

## 2016-06-29 DIAGNOSIS — Z7982 Long term (current) use of aspirin: Secondary | ICD-10-CM | POA: Diagnosis not present

## 2016-06-29 DIAGNOSIS — E875 Hyperkalemia: Secondary | ICD-10-CM | POA: Diagnosis not present

## 2016-06-29 DIAGNOSIS — D899 Disorder involving the immune mechanism, unspecified: Secondary | ICD-10-CM | POA: Diagnosis not present

## 2016-06-29 DIAGNOSIS — I10 Essential (primary) hypertension: Secondary | ICD-10-CM | POA: Diagnosis not present

## 2016-06-29 DIAGNOSIS — Z940 Kidney transplant status: Secondary | ICD-10-CM | POA: Diagnosis not present

## 2016-06-29 DIAGNOSIS — Z79899 Other long term (current) drug therapy: Secondary | ICD-10-CM | POA: Diagnosis not present

## 2016-06-29 DIAGNOSIS — Z992 Dependence on renal dialysis: Secondary | ICD-10-CM | POA: Diagnosis not present

## 2016-06-29 DIAGNOSIS — E119 Type 2 diabetes mellitus without complications: Secondary | ICD-10-CM | POA: Diagnosis not present

## 2016-06-29 DIAGNOSIS — Z794 Long term (current) use of insulin: Secondary | ICD-10-CM | POA: Diagnosis not present

## 2016-06-29 DIAGNOSIS — Z792 Long term (current) use of antibiotics: Secondary | ICD-10-CM | POA: Diagnosis not present

## 2016-06-30 ENCOUNTER — Encounter: Payer: Self-pay | Admitting: Physical Therapy

## 2016-07-02 ENCOUNTER — Ambulatory Visit: Payer: Medicare Other | Admitting: Physical Therapy

## 2016-07-06 DIAGNOSIS — I120 Hypertensive chronic kidney disease with stage 5 chronic kidney disease or end stage renal disease: Secondary | ICD-10-CM | POA: Diagnosis not present

## 2016-07-06 DIAGNOSIS — D649 Anemia, unspecified: Secondary | ICD-10-CM | POA: Diagnosis not present

## 2016-07-06 DIAGNOSIS — R0902 Hypoxemia: Secondary | ICD-10-CM | POA: Diagnosis not present

## 2016-07-06 DIAGNOSIS — N186 End stage renal disease: Secondary | ICD-10-CM | POA: Diagnosis not present

## 2016-07-06 DIAGNOSIS — Z794 Long term (current) use of insulin: Secondary | ICD-10-CM | POA: Diagnosis not present

## 2016-07-06 DIAGNOSIS — Z4822 Encounter for aftercare following kidney transplant: Secondary | ICD-10-CM | POA: Diagnosis not present

## 2016-07-06 DIAGNOSIS — Z7984 Long term (current) use of oral hypoglycemic drugs: Secondary | ICD-10-CM | POA: Diagnosis not present

## 2016-07-06 DIAGNOSIS — Z940 Kidney transplant status: Secondary | ICD-10-CM | POA: Diagnosis not present

## 2016-07-06 DIAGNOSIS — I10 Essential (primary) hypertension: Secondary | ICD-10-CM | POA: Diagnosis not present

## 2016-07-06 DIAGNOSIS — D8989 Other specified disorders involving the immune mechanism, not elsewhere classified: Secondary | ICD-10-CM | POA: Diagnosis not present

## 2016-07-06 DIAGNOSIS — E875 Hyperkalemia: Secondary | ICD-10-CM | POA: Diagnosis not present

## 2016-07-06 DIAGNOSIS — E1122 Type 2 diabetes mellitus with diabetic chronic kidney disease: Secondary | ICD-10-CM | POA: Diagnosis not present

## 2016-07-06 DIAGNOSIS — D631 Anemia in chronic kidney disease: Secondary | ICD-10-CM | POA: Diagnosis not present

## 2016-07-06 DIAGNOSIS — E119 Type 2 diabetes mellitus without complications: Secondary | ICD-10-CM | POA: Diagnosis not present

## 2016-07-06 DIAGNOSIS — Z79899 Other long term (current) drug therapy: Secondary | ICD-10-CM | POA: Diagnosis not present

## 2016-07-07 ENCOUNTER — Encounter: Payer: Self-pay | Admitting: Physical Therapy

## 2016-07-07 ENCOUNTER — Ambulatory Visit: Payer: Medicare Other | Attending: Nephrology | Admitting: Physical Therapy

## 2016-07-07 DIAGNOSIS — R2681 Unsteadiness on feet: Secondary | ICD-10-CM | POA: Diagnosis not present

## 2016-07-07 DIAGNOSIS — R2689 Other abnormalities of gait and mobility: Secondary | ICD-10-CM | POA: Insufficient documentation

## 2016-07-07 DIAGNOSIS — R296 Repeated falls: Secondary | ICD-10-CM

## 2016-07-07 DIAGNOSIS — M6281 Muscle weakness (generalized): Secondary | ICD-10-CM | POA: Insufficient documentation

## 2016-07-07 NOTE — Therapy (Signed)
[facility] [address] [place], [place], [address] Phone: [phone]   Fax:  [phone]  Physical Therapy Treatment  Patient Details  Name: Steven Logan MRN: [id number] Date of Birth: April 04, 1970 Referring Provider: Mauricia Area, MD  Encounter Date: 07/07/2016      PT End of Session - 07/07/16 1024    Visit Number 9   Number of Visits 15   Date for PT Re-Evaluation 08/13/16   Authorization Type Medicare G-code & progress note every 10 visits   PT Start Time 1019   PT Stop Time 1100   PT Time Calculation (min) 41 min   Equipment Utilized During Treatment Gait belt   Activity Tolerance Patient tolerated treatment well   Behavior During Therapy [facility] for tasks assessed/performed      Past Medical History:  Diagnosis Date  . Abscess    great toe  . Anemia   . Arthritis   . Chronic kidney disease    esrd  . Depression   . Diabetes mellitus   . Dyslipidemia   . Eczema   . Gout   . HTN (hypertension)   . Morbid obesity ([place])   . Pancreatitis   . Pneumonia    2-3 years ago  . Pulmonary embolism ([place])    3  in  lungs  at  one time...  . Renal hypertension    Hemo started  Sept 2014- MWF  . Shortness of breath    ?? chest  cold he has now.  10/8- no SOB    Past Surgical History:  Procedure Laterality Date  . AMPUTATION Right 06/12/2013   Procedure: Transtibial Amputation;  Surgeon: Newt Minion, MD;  Location: [facility];  Service: Orthopedics;  Laterality: Right;  . AMPUTATION Right 06/27/2013   Procedure: AMPUTATION ABOVE KNEE;  Surgeon: Newt Minion, MD;  Location: [facility];  Service: Orthopedics;  Laterality: Right;  Right Above Knee Amputation  . AV FISTULA PLACEMENT Left 04/07/2012   Procedure: ARTERIOVENOUS (AV) FISTULA CREATION;  Surgeon: Angelia Mould, MD;  Location: [facility];  Service: Vascular;  Laterality: Left;  . FISTULOGRAM Left 08/07/2012   Procedure: FISTULOGRAM;  Surgeon: Angelia Mould, MD;  Location: [facility] CATH LAB;  Service: Cardiovascular;  Laterality: Left;  arm  . I&D EXTREMITY Right 06/02/2013   Procedure: IRRIGATION AND DEBRIDEMENT ARTHROSCOPIC RIGHT ANKLE;  Surgeon: Augustin Schooling, MD;  Location: [facility];  Service: Orthopedics;  Laterality: Right;  . I&D EXTREMITY Right 06/04/2013   Procedure: IRRIGATION AND DEBRIDEMENT RIGHT FOOT/ANKLE;  Surgeon: Newt Minion, MD;  Location: [facility];  Service: Orthopedics;  Laterality: Right;  . I&D EXTREMITY Right 06/06/2013   Procedure: IRRIGATION AND DEBRIDEMENT EXTREMITY;  Surgeon: Newt Minion, MD;  Location: [facility];  Service: Orthopedics;  Laterality: Right;  . I&D EXTREMITY Right 06/24/2013   Procedure: IRRIGATION AND DEBRIDEMENT and Revsion of TRANSTIBIAL AMPUTATION;  Surgeon: Newt Minion, MD;  Location: [facility];  Service: Orthopedics;  Laterality: Right;  . INSERTION OF DIALYSIS CATHETER Right 09/19/2012   Procedure: INSERTION OF DIALYSIS CATHETER;  Surgeon: Angelia Mould, MD;  Location: [facility];  Service: Vascular;  Laterality: Right;  . LIGATION OF COMPETING BRANCHES OF ARTERIOVENOUS FISTULA Left 09/19/2012   Procedure: LIGATION OF COMPETING BRANCHES OF LEFT ARM ARTERIOVENOUS FISTULA; Vein angioplasty;  Surgeon: Angelia Mould, MD;  Location: [facility];  Service: Vascular;  Laterality: Left;  . none      There were no  vitals filed for this visit.      Subjective Assessment - 07/07/16 1022    Subjective Saw Chris, socket is too big and needs new one. Gerald Stabs made adjustments to keep this one on in the mean time as pt needs to see Dr Sharol Given before proceeding with socket revision. Has appt with Dr. Sharol Given Monday at [facility] in which Gerald Stabs will also attend. No falls or pain to report.                                     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    Currently in Pain? No/denies   Pain Score 0-No pain             [facility] Adult PT Treatment/Exercise - 07/07/16 1025      Transfers   Transfers Sit to Stand;Stand to Sit   Sit to Stand 5: Supervision;With upper extremity assist;From chair/3-in-1;With armrests   Stand to Sit 5: Supervision;With upper extremity assist;With armrests;To chair/3-in-1     Ambulation/Gait   Ambulation/Gait Yes   Ambulation/Gait Assistance 5: Supervision   Ambulation/Gait Assistance Details cues to narrow his base of support, for increased weight shifting onto prosthesis and for increased left step length with gait.   Ambulation Distance (Feet) 350 Feet   Assistive device Prosthesis;None   Gait Pattern Step-through pattern;Decreased arm swing - right;Decreased step length - left;Decreased stance time - right;Decreased stride length;Decreased hip/knee flexion - right;Decreased weight shift to right;Right hip hike;Trunk flexed;Abducted- right;Lateral trunk lean to right   Ambulation Surface Level;Indoor   Stairs Yes   Stairs Assistance 5: Supervision   Stairs Assistance Details (indicate cue type and reason) blocked practice on stairs working on engaging hydrualic knee with descending stairs. cues needed for weight shifting and prosthetic foot placement on stairs with descending                   Stair Management Technique Two rails;Step to pattern;Forwards   Number of Stairs 4     Neuro Re-ed    Neuro Re-ed Details  in parallel bar : 4 inch box with incline blocks on both sides (lateral/forward)- blocked practice of using prosthesis to go down inclines with emphasis on engaging knee; also with prosthesis planted on decline- left foot stepping foward and backwards so to engage right prothetic knee with activity in blocked practice. cues for both activities on step length and weight shifting.                                  Prosthetics   Current prosthetic wear  tolerance (days/week)  7 days /wk   Residual limb condition  limb is intact; abdominal incision has healed with steristrips have fallen off   Education Provided Residual limb care;Proper weight-bearing schedule/adjustment   Person(s) Educated Patient   Education Method Explanation;Demonstration;Verbal cues   Education Method Verbalized understanding;Verbal cues required;Needs further instruction            PT Short Term Goals - 06/17/16 1810      PT SHORT TERM GOAL #1   Title Patient verbalizes & demonstrates understanding of updated HEP. (Target Date: 07/16/2016)   Time 4  Period Weeks   Status On-going     PT SHORT TERM GOAL #2   Title Patient ambulates with head turns to scan environment maintaining path & pace with cues. (Target Date: 07/16/2016)   Time 4   Period Weeks   Status On-going     PT SHORT TERM GOAL #3   Title Four-Square step test <14 sec to indicate lower fall risk.  (Target Date: 07/16/2016)   Time 4   Period Weeks   Status On-going     PT SHORT TERM GOAL #4   Title Patient demonstrates proper step position & weight shift with direction changes with verbal cues only from PT. (Target Date: 07/16/2016)   Time 4   Period Weeks   Status On-going     PT SHORT TERM GOAL #5   Title Patient descends stairs with 2 rails reciprocally with prosthetic knee control. (Target Date: 07/16/2016)   Time 4   Period Weeks   Status On-going           PT Long Term Goals - 06/17/16 1813      PT LONG TERM GOAL #1   Title Patient ambulates at comfortable gait velocity >4.0 ft/sec and increases to fast pace >5.0 ft/sec safely. (Target Date: 08/15/2016)   Time 8   Period Weeks   Status On-going     PT LONG TERM GOAL #2   Title Patient negotiates ramps & curbs with prosthesis only and descend stairs with 1 rail modified independent.   (Target Date: 08/15/2016)   Time 8   Period Weeks   Status On-going     PT LONG TERM GOAL #3   Title Berg Balance >54/56 to decrease  fall risk  (Target Date: 08/15/2016)   Time 8   Period Weeks   Status On-going     PT LONG TERM GOAL #4   Title Functional Gait Assessment >25/30 to decrease fall risk.  (Target Date: 08/15/2016)   Time 8   Period Weeks   Status On-going     PT LONG TERM GOAL #5   Title Patient lifts & carries 25# 50', carries grocery bag on stairs with 1 rail and performs work simulation tasks safely, independently. (Target Date: 08/15/2016)   Time 8   Period Weeks   Status On-going            Plan - 07/07/16 1024    Clinical Impression Statement Today's skilled session continued to address use of hydraulic knee with gait and mobility. Pt continues to need cues to shift weight onto prosthesis and fullly engage hydraulics with mobility. This did improve with instruction today with minimal carryover noted in session Pt is progressing toward goals and needs continued PT to work towards unmet goals,.                      Rehab Potential Good   PT Frequency 1x / week   PT Duration 8 weeks   PT Treatment/Interventions ADLs/Self Care Home Management;Gait training;Stair training;Functional mobility training;Therapeutic activities;Therapeutic exercise;Balance training;Neuromuscular re-education;Patient/family education;Prosthetic Training   PT Next Visit Plan G-code next visit; prosthetic training on stairs with 1 rail; prosthetic training working towards STGs, prosthetic gait + head turns/nods/diagonals    Consulted and Agree with Plan of Care Patient      Patient will benefit from skilled therapeutic intervention in order to improve the following deficits and impairments:  Abnormal gait, Decreased activity tolerance, Decreased balance, Decreased mobility, Decreased safety awareness, Postural dysfunction, Prosthetic Dependency,  Decreased range of motion  Visit Diagnosis: Other abnormalities of gait and mobility  Unsteadiness on feet  Repeated falls  Muscle weakness (generalized)     Problem  List Patient Active Problem List   Diagnosis Date Noted  . Acute blood loss anemia 06/25/2013  . Hyperglycemia 06/07/2013  . Arm DVT (deep venous thromboembolism), acute ([place]) 06/07/2013  . ESRD on dialysis ([place]) 06/05/2013  . Encephalopathy, toxic 06/03/2013  . Cellulitis and abscess of leg 06/03/2013  . Anemia in chronic kidney disease 06/03/2013  . Arm edema 06/03/2013  . Leg abscess 06/03/2013  . Severe sepsis ([place]) 06/03/2013  . Septic shock ([place]) 06/03/2013  . Septic arthritis of ankle or foot, right 06/03/2013  . Abscess of right lower leg 06/02/2013  . Wrist lump 07/05/2012  . Swelling of limb-Left index and middle fingers 07/05/2012  . Aftercare following surgery of the circulatory system, [place] 07/05/2012  . Tingling-left wrist and some pain. 07/05/2012  . Pulmonary edema 05/04/2012  . Acute respiratory failure ([place]) 05/04/2012  . CKD (chronic kidney disease) stage 5, GFR less than 15 ml/min (HCC) 05/04/2012  . Hyperkalemia 05/04/2012  . Pre-operative cardiovascular examination 03/15/2012  . Iron deficiency anemia 09/22/2010  . HTN (hypertension) 09/22/2010  . Hyperlipidemia 09/22/2010  . Gout 09/22/2010  . CRI (chronic renal insufficiency) 09/22/2010  . Pulmonary embolism, bilateral ([place]) 09/22/2010    Willow Ora, PTA, [facility] [address], [place], [place] [address] [phone] 07/07/16, 10:08 PM   Name: Steven Logan MRN: [id number] Date of Birth: Jun 11, 1970

## 2016-07-12 ENCOUNTER — Ambulatory Visit (INDEPENDENT_AMBULATORY_CARE_PROVIDER_SITE_OTHER): Payer: Self-pay | Admitting: Orthopedic Surgery

## 2016-07-12 ENCOUNTER — Encounter (INDEPENDENT_AMBULATORY_CARE_PROVIDER_SITE_OTHER): Payer: Self-pay | Admitting: Orthopedic Surgery

## 2016-07-12 ENCOUNTER — Ambulatory Visit (INDEPENDENT_AMBULATORY_CARE_PROVIDER_SITE_OTHER): Payer: Medicare Other | Admitting: Orthopedic Surgery

## 2016-07-12 VITALS — Ht 72.0 in | Wt 216.0 lb

## 2016-07-12 DIAGNOSIS — Z89611 Acquired absence of right leg above knee: Secondary | ICD-10-CM | POA: Diagnosis not present

## 2016-07-12 NOTE — Progress Notes (Signed)
Office Visit Note   Patient: Steven Logan           Date of Birth: 21-Mar-1970           MRN: [id number] Visit Date: 07/12/2016              Requested by: Prince Solian, MD [address] [place], [place] [address] PCP: Prince Solian, MD  Chief Complaint  Patient presents with  . Right Leg - Follow-up    3 yrs s/p right AKA. Volume loss in limb since kidney transplant. [facility] eval new prosthetic.       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 Patient last months and received a kidney transplant he has had significant decrease in the residual volume of his leg on the right he is unable to safely ambulate with his current prosthetic socket being too large and presents for evaluation. 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  Assessment & Plan: Visit Diagnoses:  1. History of right above knee amputation ([place])     Plan: Patient was given a prescription for a new socket with suction fitting new liner new materials. His knee foot and ankle are working well. Follow-up as needed.  Follow-Up Instructions: Return if symptoms worsen or fail to improve.   Ortho Exam  Patient is alert, oriented, no adenopathy, well-dressed, normal affect, normal respiratory effort. Examination patient has an antalgic gait. He has no ulcers no cellulitis no signs of infection. The prosthetic socket is not fitting properly and has no torsional support or varus or valgus support.  Imaging: No results found.  Labs: Lab Results  Component Value Date   HGBA1C 7.0 (H) 06/07/2013   HGBA1C 7.8 (H) 06/03/2013   ESRSEDRATE 135 (H) 06/02/2013   CRP 40.5 (H) 06/02/2013   REPTSTATUS 07/06/2013 FINAL 06/29/2013   GRAMSTAIN  06/04/2013    FEW WBC PRESENT,BOTH PMN AND MONONUCLEAR NO SQUAMOUS EPITHELIAL CELLS SEEN FEW GRAM POSITIVE COCCI IN PAIRS Performed at [facility]  06/04/2013   RARE WBC PRESENT,BOTH PMN AND MONONUCLEAR NO SQUAMOUS EPITHELIAL CELLS SEEN FEW GRAM POSITIVE COCCI IN PAIRS Performed at [facility]  06/29/2013    NO GROWTH 5 DAYS Performed at [facility] 06/04/2013    Orders:  No orders of the defined types were placed in this encounter.  No orders of the defined types were placed in this encounter.    Procedures: No procedures performed  Clinical Data: No additional findings.  ROS:  All other systems negative, except as noted in the HPI. Review of Systems  Objective: Vital Signs: Ht 6' (1.829 m)   Wt 216 lb (98 kg)   BMI 29.29 kg/m   Specialty Comments:  No specialty comments available.  PMFS History: Patient Active Problem List   Diagnosis Date Noted  . History of right above knee amputation ([place]) 07/12/2016  . Acute blood loss anemia 06/25/2013  . Hyperglycemia 06/07/2013  . Arm DVT (deep venous thromboembolism), acute ([place]) 06/07/2013  . ESRD on dialysis ([place]) 06/05/2013  . Encephalopathy, toxic 06/03/2013  . Cellulitis and abscess of leg 06/03/2013  . Anemia in chronic kidney disease 06/03/2013  . Arm edema 06/03/2013  . Leg abscess 06/03/2013  . Severe sepsis ([place]) 06/03/2013  . Septic shock ([place]) 06/03/2013  . Septic arthritis of ankle or foot, right 06/03/2013  . Abscess of right lower  leg 06/02/2013  . Wrist lump 07/05/2012  . Swelling of limb-Left index and middle fingers 07/05/2012  . Aftercare following surgery of the circulatory system, [place] 07/05/2012  . Tingling-left wrist and some pain. 07/05/2012  . Pulmonary edema 05/04/2012  . Acute respiratory failure ([place]) 05/04/2012  . CKD (chronic kidney disease) stage 5, GFR less than 15 ml/min (HCC) 05/04/2012  . Hyperkalemia 05/04/2012  . Pre-operative cardiovascular examination 03/15/2012  . Iron deficiency anemia 09/22/2010  . HTN (hypertension) 09/22/2010  . Hyperlipidemia 09/22/2010  . Gout  09/22/2010  . CRI (chronic renal insufficiency) 09/22/2010  . Pulmonary embolism, bilateral ([place]) 09/22/2010   Past Medical History:  Diagnosis Date  . Abscess    great toe  . Anemia   . Arthritis   . Chronic kidney disease    esrd  . Depression   . Diabetes mellitus   . Dyslipidemia   . Eczema   . Gout   . HTN (hypertension)   . Morbid obesity ([place])   . Pancreatitis   . Pneumonia    2-3 years ago  . Pulmonary embolism ([place])    3  in  lungs  at  one time...  . Renal hypertension    Hemo started  Sept 2014- MWF  . Shortness of breath    ?? chest  cold he has now.  10/8- no SOB    Family History  Problem Relation Age of Onset  . Stroke Mother   . Diabetes Mother   . Hypertension Mother   . Hypertension Father   . Cancer Unknown   . Coronary artery disease Unknown   . Colon cancer Maternal Grandmother 50  . Hypertension Brother     Past Surgical History:  Procedure Laterality Date  . AMPUTATION Right 06/12/2013   Procedure: Transtibial Amputation;  Surgeon: Newt Minion, MD;  Location: [facility];  Service: Orthopedics;  Laterality: Right;  . AMPUTATION Right 06/27/2013   Procedure: AMPUTATION ABOVE KNEE;  Surgeon: Newt Minion, MD;  Location: [facility];  Service: Orthopedics;  Laterality: Right;  Right Above Knee Amputation  . AV FISTULA PLACEMENT Left 04/07/2012   Procedure: ARTERIOVENOUS (AV) FISTULA CREATION;  Surgeon: Angelia Mould, MD;  Location: [facility];  Service: Vascular;  Laterality: Left;  . FISTULOGRAM Left 08/07/2012   Procedure: FISTULOGRAM;  Surgeon: Angelia Mould, MD;  Location: [facility] CATH LAB;  Service: Cardiovascular;  Laterality: Left;  arm  . I&D EXTREMITY Right 06/02/2013   Procedure: IRRIGATION AND DEBRIDEMENT ARTHROSCOPIC RIGHT ANKLE;  Surgeon: Augustin Schooling, MD;  Location: [facility];  Service: Orthopedics;  Laterality: Right;  . I&D EXTREMITY Right 06/04/2013   Procedure: IRRIGATION AND DEBRIDEMENT RIGHT FOOT/ANKLE;  Surgeon: Newt Minion, MD;   Location: [facility];  Service: Orthopedics;  Laterality: Right;  . I&D EXTREMITY Right 06/06/2013   Procedure: IRRIGATION AND DEBRIDEMENT EXTREMITY;  Surgeon: Newt Minion, MD;  Location: [facility];  Service: Orthopedics;  Laterality: Right;  . I&D EXTREMITY Right 06/24/2013   Procedure: IRRIGATION AND DEBRIDEMENT and Revsion of TRANSTIBIAL AMPUTATION;  Surgeon: Newt Minion, MD;  Location: [facility];  Service: Orthopedics;  Laterality: Right;  . INSERTION OF DIALYSIS CATHETER Right 09/19/2012   Procedure: INSERTION OF DIALYSIS CATHETER;  Surgeon: Angelia Mould, MD;  Location: [facility];  Service: Vascular;  Laterality: Right;  . LIGATION OF COMPETING BRANCHES OF ARTERIOVENOUS FISTULA Left 09/19/2012   Procedure: LIGATION OF COMPETING BRANCHES OF LEFT ARM ARTERIOVENOUS FISTULA; Vein angioplasty;  Surgeon: Judeth Cornfield  Scot Dock, MD;  Location: [facility] OR;  Service: Vascular;  Laterality: Left;  . none     Social History   Occupational History  . Not on file.   Social History Main Topics  . Smoking status: Never Smoker  . Smokeless tobacco: Never Used  . Alcohol use No     Comment: occ  . Drug use: Yes    Types: Marijuana     Comment: random, last 2 weeks ago  . Sexual activity: Not on file

## 2016-07-13 DIAGNOSIS — D649 Anemia, unspecified: Secondary | ICD-10-CM | POA: Diagnosis not present

## 2016-07-13 DIAGNOSIS — Z4822 Encounter for aftercare following kidney transplant: Secondary | ICD-10-CM | POA: Diagnosis not present

## 2016-07-13 DIAGNOSIS — E119 Type 2 diabetes mellitus without complications: Secondary | ICD-10-CM | POA: Diagnosis not present

## 2016-07-13 DIAGNOSIS — Z7982 Long term (current) use of aspirin: Secondary | ICD-10-CM | POA: Diagnosis not present

## 2016-07-13 DIAGNOSIS — N186 End stage renal disease: Secondary | ICD-10-CM | POA: Diagnosis not present

## 2016-07-13 DIAGNOSIS — D8989 Other specified disorders involving the immune mechanism, not elsewhere classified: Secondary | ICD-10-CM | POA: Diagnosis not present

## 2016-07-13 DIAGNOSIS — D631 Anemia in chronic kidney disease: Secondary | ICD-10-CM | POA: Diagnosis not present

## 2016-07-13 DIAGNOSIS — E1122 Type 2 diabetes mellitus with diabetic chronic kidney disease: Secondary | ICD-10-CM | POA: Diagnosis not present

## 2016-07-13 DIAGNOSIS — Z79899 Other long term (current) drug therapy: Secondary | ICD-10-CM | POA: Diagnosis not present

## 2016-07-13 DIAGNOSIS — Z794 Long term (current) use of insulin: Secondary | ICD-10-CM | POA: Diagnosis not present

## 2016-07-13 DIAGNOSIS — E875 Hyperkalemia: Secondary | ICD-10-CM | POA: Diagnosis not present

## 2016-07-13 DIAGNOSIS — I120 Hypertensive chronic kidney disease with stage 5 chronic kidney disease or end stage renal disease: Secondary | ICD-10-CM | POA: Diagnosis not present

## 2016-07-13 DIAGNOSIS — I10 Essential (primary) hypertension: Secondary | ICD-10-CM | POA: Diagnosis not present

## 2016-07-13 DIAGNOSIS — D899 Disorder involving the immune mechanism, unspecified: Secondary | ICD-10-CM | POA: Diagnosis not present

## 2016-07-13 DIAGNOSIS — Z940 Kidney transplant status: Secondary | ICD-10-CM | POA: Diagnosis not present

## 2016-07-14 ENCOUNTER — Encounter: Payer: Self-pay | Admitting: Physical Therapy

## 2016-07-14 ENCOUNTER — Ambulatory Visit: Payer: Medicare Other | Admitting: Physical Therapy

## 2016-07-14 DIAGNOSIS — M6281 Muscle weakness (generalized): Secondary | ICD-10-CM | POA: Diagnosis not present

## 2016-07-14 DIAGNOSIS — R2681 Unsteadiness on feet: Secondary | ICD-10-CM

## 2016-07-14 DIAGNOSIS — R2689 Other abnormalities of gait and mobility: Secondary | ICD-10-CM

## 2016-07-14 DIAGNOSIS — R296 Repeated falls: Secondary | ICD-10-CM

## 2016-07-14 NOTE — Therapy (Signed)
[facility] [address] [place], [place], [address] Phone: [phone]   Fax:  [phone]  Physical Therapy Treatment  Patient Details  Name: Steven Logan MRN: [id number] Date of Birth: November 07, 1970 Referring Provider: Mauricia Area, MD  Encounter Date: 07/14/2016      PT End of Session - 07/14/16 1142    Visit Number 10   Number of Visits 15   Date for PT Re-Evaluation 08/13/16   Authorization Type Medicare G-code & progress note every 10 visits   PT Start Time 0931   PT Stop Time 1015   PT Time Calculation (min) 44 min   Equipment Utilized During Treatment Gait belt   Activity Tolerance Patient tolerated treatment well   Behavior During Therapy [facility] for tasks assessed/performed      Past Medical History:  Diagnosis Date  . Abscess    great toe  . Anemia   . Arthritis   . Chronic kidney disease    esrd  . Depression   . Diabetes mellitus   . Dyslipidemia   . Eczema   . Gout   . HTN (hypertension)   . Morbid obesity ([place])   . Pancreatitis   . Pneumonia    2-3 years ago  . Pulmonary embolism ([place])    3  in  lungs  at  one time...  . Renal hypertension    Hemo started  Sept 2014- MWF  . Shortness of breath    ?? chest  cold he has now.  10/8- no SOB    Past Surgical History:  Procedure Laterality Date  . AMPUTATION Right 06/12/2013   Procedure: Transtibial Amputation;  Surgeon: Newt Minion, MD;  Location: [facility];  Service: Orthopedics;  Laterality: Right;  . AMPUTATION Right 06/27/2013   Procedure: AMPUTATION ABOVE KNEE;  Surgeon: Newt Minion, MD;  Location: [facility];  Service: Orthopedics;  Laterality: Right;  Right Above Knee Amputation  . AV FISTULA PLACEMENT Left 04/07/2012   Procedure: ARTERIOVENOUS (AV) FISTULA CREATION;  Surgeon: Angelia Mould, MD;  Location: [facility];  Service: Vascular;  Laterality: Left;  . FISTULOGRAM Left 08/07/2012   Procedure: FISTULOGRAM;  Surgeon:  Angelia Mould, MD;  Location: [facility] CATH LAB;  Service: Cardiovascular;  Laterality: Left;  arm  . I&D EXTREMITY Right 06/02/2013   Procedure: IRRIGATION AND DEBRIDEMENT ARTHROSCOPIC RIGHT ANKLE;  Surgeon: Augustin Schooling, MD;  Location: [facility];  Service: Orthopedics;  Laterality: Right;  . I&D EXTREMITY Right 06/04/2013   Procedure: IRRIGATION AND DEBRIDEMENT RIGHT FOOT/ANKLE;  Surgeon: Newt Minion, MD;  Location: [facility];  Service: Orthopedics;  Laterality: Right;  . I&D EXTREMITY Right 06/06/2013   Procedure: IRRIGATION AND DEBRIDEMENT EXTREMITY;  Surgeon: Newt Minion, MD;  Location: [facility];  Service: Orthopedics;  Laterality: Right;  . I&D EXTREMITY Right 06/24/2013   Procedure: IRRIGATION AND DEBRIDEMENT and Revsion of TRANSTIBIAL AMPUTATION;  Surgeon: Newt Minion, MD;  Location: [facility];  Service: Orthopedics;  Laterality: Right;  . INSERTION OF DIALYSIS CATHETER Right 09/19/2012   Procedure: INSERTION OF DIALYSIS CATHETER;  Surgeon: Angelia Mould, MD;  Location: [facility];  Service: Vascular;  Laterality: Right;  . LIGATION OF COMPETING BRANCHES OF ARTERIOVENOUS FISTULA Left 09/19/2012   Procedure: LIGATION OF COMPETING BRANCHES OF LEFT ARM ARTERIOVENOUS FISTULA; Vein angioplasty;  Surgeon: Angelia Mould, MD;  Location: [facility];  Service: Vascular;  Laterality: Left;  . none      There were no  vitals filed for this visit.      Subjective Assessment - 07/14/16 0933    Subjective Patient reports he took an order for a new socket to [facility] from Dr. Sharol Given. Patient denies any changes or falls. 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 Patient reports if he completes physical activity for 15-20 minutes, then he feels short of breath.    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    Currently in Pain? No/denies            [facility] PT Assessment - 07/14/16 0930      Standardized Balance Assessment   10 Meter Walk 9.75s or 3.71f/s     Berg Balance Test   Sit to Stand Able to stand without using hands and stabilize independently   Standing Unsupported Able to stand safely 2 minutes   Sitting with Back Unsupported but Feet Supported on Floor or Stool Able to sit safely and securely 2 minutes   Stand to Sit Sits safely with minimal use of hands   Transfers Able to transfer safely, minor use of hands   Standing Unsupported with Eyes Closed Able to stand 10 seconds safely   Standing Ubsupported with Feet Together Able to place feet together independently and stand 1 minute safely   From Standing, Reach Forward with Outstretched Arm Can reach confidently >25 cm (10")   From Standing Position, Pick up Object from Floor Unable to pick up shoe, but reaches 2-5 cm (1-2") from shoe and balances independently  due to poor hydraulic knee control   From Standing Position, Turn to Look Behind Over each Shoulder Looks behind from both sides and weight shifts well   Turn 360 Degrees Able to turn 360 degrees safely in 4 seconds or less   Standing Unsupported, Alternately Place Feet on Step/Stool Able to stand independently and safely and complete 8 steps in 20 seconds   Standing Unsupported, One Foot in Front Able to plae foot ahead of the other independently and hold 30 seconds   Standing on One Leg Able to lift leg independently and hold > 10 seconds   Total Score 53     Functional Gait  Assessment   Gait assessed  Yes   Gait Level Surface Walks 20 ft in less than 7 sec but greater than 5.5 sec, uses assistive device, slower speed, mild gait deviations, or deviates 6-10 in outside of the 12 in walkway width.   Change in Gait Speed Able to change speed, demonstrates mild gait deviations,  deviates 6-10 in outside of the 12 in walkway width, or no gait deviations, unable to achieve a major change in velocity, or uses a change in velocity, or uses an assistive device.   Gait with Horizontal Head Turns Performs head turns smoothly with no change in gait. Deviates no more than 6 in outside 12 in walkway width   Gait with Vertical Head Turns Performs head turns with no change in gait. Deviates no more than 6 in outside 12 in walkway width.   Gait and Pivot Turn Pivot turns safely within 3 sec and stops quickly with no loss of balance.   Step Over Obstacle Is able to step over one shoe box (4.5 in  total height) without changing gait speed. No evidence of imbalance.   Gait with Narrow Base of Support Ambulates 4-7 steps.   Gait with Eyes Closed Walks 20 ft, uses assistive device, slower speed, mild gait deviations, deviates 6-10 in outside 12 in walkway width. Ambulates 20 ft in less than 9 sec but greater than 7 sec.   Ambulating Backwards Walks 20 ft, uses assistive device, slower speed, mild gait deviations, deviates 6-10 in outside 12 in walkway width.   Steps Two feet to a stair, must use rail.   Total Score 21                     [facility] Adult PT Treatment/Exercise - 07/14/16 0930      Transfers   Transfers Sit to Stand;Stand to Sit   Sit to Stand 5: Supervision;With upper extremity assist;From chair/3-in-1;With armrests   Stand to Sit 5: Supervision;With upper extremity assist;With armrests;To chair/3-in-1   Stand to Sit Details (indicate cue type and reason) Verbal cues for sequencing;Verbal cues for technique;Verbal cues for precautions/safety   Stand to Sit Details Requires cueing for sequencing and weight shift over prosthesis to foster hydraulic knee engagement.   Comments PT instructed patient in performing blocked practice of stand to sit transfers with cueing as outlined above.     Ambulation/Gait   Ambulation/Gait Yes   Ambulation/Gait Assistance 5:  Supervision   Ambulation/Gait Assistance Details Requires cueing when turning for proper step length and weight shift to achieve proper prosthetic knee engagement   Ambulation Distance (Feet) 150 Feet  ambulation around clinic between various tasks    Assistive device Prosthesis;None   Gait Pattern Step-through pattern;Decreased arm swing - right;Decreased step length - left;Decreased stance time - right;Decreased stride length;Decreased hip/knee flexion - right;Decreased weight shift to right;Right hip hike;Trunk flexed;Abducted- right;Lateral trunk lean to right   Ambulation Surface Level;Indoor   Stairs Yes   Stairs Assistance 5: Supervision   Stairs Assistance Details (indicate cue type and reason) Requires cueing for prosthetic foot placement and hydraulic knee engagement.    Stair Management Technique Two rails;Step to pattern;Forwards   Number of Stairs 4     Therapeutic Activites    Therapeutic Activities Lifting   Lifting Requires demo and cueing for proper weight shift over prosthesis to engage prosthetic knee when retrieving objects from the floor. Instructed patient in performing this activity with a chair behind him for safety.     Neuro Re-ed    Neuro Re-ed Details  Instructed patient in performing 90 degree turns and changing direction with proper weight shift and engagement of hydraulic knee without having to take corrective steps. Required verbal cueing from PT for consistent, proper knee engagement and weight shift.     Prosthetics   Current prosthetic wear tolerance (days/week)  7 days /wk   Residual limb condition  limb is intact; abdominal incision has healed with steristrips have fallen off   Education Provided Residual limb care;Proper weight-bearing schedule/adjustment                PT Education - 07/14/16 0955    Education provided Yes   Education Details increase how long he is completing walking (ultimate goal = 30 minutes) to increase endurance;     Person(s) Educated Patient   Methods Explanation   Comprehension Verbalized understanding          PT Short Term Goals - 07/14/16 0955      PT SHORT TERM GOAL #1  Title Patient verbalizes & demonstrates understanding of updated HEP. (Target Date: 07/16/2016)    Baseline MET 07/14/2016    Time 4   Period Weeks   Status Achieved     PT SHORT TERM GOAL #2   Title Patient ambulates with head turns to scan environment maintaining path & pace with cues. (Target Date: 07/16/2016)   Baseline MET 07/13/2016    Time 4   Period Weeks   Status Achieved     PT SHORT TERM GOAL #3   Title Four-Square step test <14 sec to indicate lower fall risk.  (Target Date: 07/16/2016)   Baseline MET 07/14/2016 Four square step test = 13.6s   Time 4   Period Weeks   Status Achieved     PT SHORT TERM GOAL #4   Title Patient demonstrates proper step position & weight shift with direction changes with verbal cues only from PT. (Target Date: 07/16/2016)   Baseline MET 07/14/2016   Time 4   Period Weeks   Status Achieved     PT SHORT TERM GOAL #5   Title Patient descends stairs with 2 rails reciprocally with prosthetic knee control. (Target Date: 07/16/2016)   Baseline MET 07/14/2016   Time 4   Period Weeks   Status Achieved           PT Long Term Goals - 06/17/16 1813      PT LONG TERM GOAL #1   Title Patient ambulates at comfortable gait velocity >4.0 ft/sec and increases to fast pace >5.0 ft/sec safely. (Target Date: 08/15/2016)   Time 8   Period Weeks   Status On-going     PT LONG TERM GOAL #2   Title Patient negotiates ramps & curbs with prosthesis only and descend stairs with 1 rail modified independent.   (Target Date: 08/15/2016)   Time 8   Period Weeks   Status On-going     PT LONG TERM GOAL #3   Title Berg Balance >54/56 to decrease fall risk  (Target Date: 08/15/2016)   Time 8   Period Weeks   Status On-going     PT LONG TERM GOAL #4   Title Functional Gait Assessment >25/30 to  decrease fall risk.  (Target Date: 08/15/2016)   Time 8   Period Weeks   Status On-going     PT LONG TERM GOAL #5   Title Patient lifts & carries 25# 50', carries grocery bag on stairs with 1 rail and performs work simulation tasks safely, independently. (Target Date: 08/15/2016)   Time 8   Period Weeks   Status On-going               Plan - 07/14/16 1144    Clinical Impression Statement Today's skilled PT session focused on assessing patient's short term goals. Patient has met all short term goals, and is making great progress in consistently properly engaging his hydraulic knee during functional mobility. Patient is making progress, and will benefit from continued skilled PT to address remaining functional mobility deficits.    Rehab Potential Good   PT Frequency 1x / week   PT Duration 8 weeks   PT Treatment/Interventions ADLs/Self Care Home Management;Gait training;Stair training;Functional mobility training;Therapeutic activities;Therapeutic exercise;Balance training;Neuromuscular re-education;Patient/family education;Prosthetic Training   PT Next Visit Plan obstacle negotiation with prosthesis; proper engagement of hydraulic knee in transfers, inclines and stairs    Consulted and Agree with Plan of Care Patient      Patient will benefit from skilled therapeutic intervention in  order to improve the following deficits and impairments:  Abnormal gait, Decreased activity tolerance, Decreased balance, Decreased mobility, Decreased safety awareness, Postural dysfunction, Prosthetic Dependency, Decreased range of motion  Visit Diagnosis: Other abnormalities of gait and mobility  Unsteadiness on feet  Repeated falls  Muscle weakness (generalized)       G-Codes - 08-03-16 1146    Functional Assessment Tool Used (Outpatient Only) Berg Balance 53/56, Functional Gait Assessment 21/30   Functional Limitation Mobility: Walking and moving around   Mobility: Walking and Moving  Around Current Status [phone]) At least 20 percent but less than 40 percent impaired, limited or restricted   Mobility: Walking and Moving Around Goal Status [phone]) At least 1 percent but less than 20 percent impaired, limited or restricted      Problem List Patient Active Problem List   Diagnosis Date Noted  . History of right above knee amputation ([place]) 07/12/2016  . Acute blood loss anemia 06/25/2013  . Hyperglycemia 06/07/2013  . Arm DVT (deep venous thromboembolism), acute ([place]) 06/07/2013  . ESRD on dialysis ([place]) 06/05/2013  . Encephalopathy, toxic 06/03/2013  . Cellulitis and abscess of leg 06/03/2013  . Anemia in chronic kidney disease 06/03/2013  . Arm edema 06/03/2013  . Leg abscess 06/03/2013  . Severe sepsis ([place]) 06/03/2013  . Septic shock ([place]) 06/03/2013  . Septic arthritis of ankle or foot, right 06/03/2013  . Abscess of right lower leg 06/02/2013  . Wrist lump 07/05/2012  . Swelling of limb-Left index and middle fingers 07/05/2012  . Aftercare following surgery of the circulatory system, [place] 07/05/2012  . Tingling-left wrist and some pain. 07/05/2012  . Pulmonary edema 05/04/2012  . Acute respiratory failure ([place]) 05/04/2012  . CKD (chronic kidney disease) stage 5, GFR less than 15 ml/min (HCC) 05/04/2012  . Hyperkalemia 05/04/2012  . Pre-operative cardiovascular examination 03/15/2012  . Iron deficiency anemia 09/22/2010  . HTN (hypertension) 09/22/2010  . Hyperlipidemia 09/22/2010  . Gout 09/22/2010  . CRI (chronic renal insufficiency) 09/22/2010  . Pulmonary embolism, bilateral ([place]) 09/22/2010    Weight per patient request: patient wearing prosthesis = 222.2#,  Sitting in chair with prosthesis 239.2 (chair weight = 17#); sitting in chair without prosthesis = 229.2 (prosthesis wt 10#), body weight without prosthesis 212.2#. BMI: 212.2/.9004= 235.7 # estimated wt without amputation would be.  235.7/ 5184 (height squared) X 703 = 32% BMI   Physical  Therapy Progress Note  Dates of Reporting Period: 03/03/2016 to August 03, 2016  Objective Reports of Subjective Statement: Patient reports improved mobility with prosthesis but issues with suction seal releasing with volume changes to his limb s/p kidney transplant.   Objective Measurements: see above  Goal Update: see above  Plan: continue established plan of care.   Reason Skilled Services are Required: Patient needs skilled care to engage microprocessor knee in mobility.   Arelia Sneddon, SPT 08/03/2016, 11:47 AM  Jamey Reas, PT, DPT  2016-08-03, 4:42 PM  [facility] [address] [place], [place], [address] Phone: [phone]   Fax:  [phone]  Name: WILLIAMSON CAVANAH MRN: [id number] Date of Birth: [birth year]

## 2016-07-20 DIAGNOSIS — E1122 Type 2 diabetes mellitus with diabetic chronic kidney disease: Secondary | ICD-10-CM | POA: Diagnosis not present

## 2016-07-20 DIAGNOSIS — D8989 Other specified disorders involving the immune mechanism, not elsewhere classified: Secondary | ICD-10-CM | POA: Diagnosis not present

## 2016-07-20 DIAGNOSIS — Z79899 Other long term (current) drug therapy: Secondary | ICD-10-CM | POA: Diagnosis not present

## 2016-07-20 DIAGNOSIS — I120 Hypertensive chronic kidney disease with stage 5 chronic kidney disease or end stage renal disease: Secondary | ICD-10-CM | POA: Diagnosis not present

## 2016-07-20 DIAGNOSIS — E119 Type 2 diabetes mellitus without complications: Secondary | ICD-10-CM | POA: Diagnosis not present

## 2016-07-20 DIAGNOSIS — Z940 Kidney transplant status: Secondary | ICD-10-CM | POA: Diagnosis not present

## 2016-07-20 DIAGNOSIS — N186 End stage renal disease: Secondary | ICD-10-CM | POA: Diagnosis not present

## 2016-07-20 DIAGNOSIS — Z794 Long term (current) use of insulin: Secondary | ICD-10-CM | POA: Diagnosis not present

## 2016-07-20 DIAGNOSIS — E872 Acidosis, unspecified: Secondary | ICD-10-CM | POA: Insufficient documentation

## 2016-07-20 DIAGNOSIS — E875 Hyperkalemia: Secondary | ICD-10-CM | POA: Diagnosis not present

## 2016-07-20 DIAGNOSIS — Z4822 Encounter for aftercare following kidney transplant: Secondary | ICD-10-CM | POA: Diagnosis not present

## 2016-07-20 DIAGNOSIS — I10 Essential (primary) hypertension: Secondary | ICD-10-CM | POA: Diagnosis not present

## 2016-07-20 DIAGNOSIS — D631 Anemia in chronic kidney disease: Secondary | ICD-10-CM | POA: Diagnosis not present

## 2016-07-21 ENCOUNTER — Ambulatory Visit: Payer: Medicare Other | Admitting: Physical Therapy

## 2016-07-27 ENCOUNTER — Ambulatory Visit: Payer: Medicare Other | Admitting: Physical Therapy

## 2016-07-30 ENCOUNTER — Encounter: Payer: Self-pay | Admitting: Physical Therapy

## 2016-07-30 ENCOUNTER — Ambulatory Visit: Payer: Medicare Other | Attending: Nephrology | Admitting: Physical Therapy

## 2016-07-30 DIAGNOSIS — R2681 Unsteadiness on feet: Secondary | ICD-10-CM | POA: Insufficient documentation

## 2016-07-30 DIAGNOSIS — R296 Repeated falls: Secondary | ICD-10-CM | POA: Insufficient documentation

## 2016-07-30 DIAGNOSIS — R2689 Other abnormalities of gait and mobility: Secondary | ICD-10-CM | POA: Insufficient documentation

## 2016-07-30 DIAGNOSIS — M25651 Stiffness of right hip, not elsewhere classified: Secondary | ICD-10-CM | POA: Diagnosis not present

## 2016-07-30 DIAGNOSIS — M6281 Muscle weakness (generalized): Secondary | ICD-10-CM | POA: Insufficient documentation

## 2016-07-30 NOTE — Therapy (Signed)
[facility] [address] [place], [place], [address] Phone: [phone]   Fax:  [phone]  Physical Therapy Treatment  Patient Details  Name: Steven Logan MRN: [id number] Date of Birth: January 21, 1971 Referring Provider: Mauricia Area, MD  Encounter Date: 07/30/2016      PT End of Session - 07/30/16 1120    Visit Number 11   Number of Visits 15   Date for PT Re-Evaluation 08/13/16   Authorization Type Medicare G-code & progress note every 10 visits   PT Start Time 1017   PT Stop Time 1101   PT Time Calculation (min) 44 min   Activity Tolerance Patient tolerated treatment well   Behavior During Therapy [facility] for tasks assessed/performed      Past Medical History:  Diagnosis Date  . Abscess    great toe  . Anemia   . Arthritis   . Chronic kidney disease    esrd  . Depression   . Diabetes mellitus   . Dyslipidemia   . Eczema   . Gout   . HTN (hypertension)   . Morbid obesity ([place])   . Pancreatitis   . Pneumonia    2-3 years ago  . Pulmonary embolism ([place])    3  in  lungs  at  one time...  . Renal hypertension    Hemo started  Sept 2014- MWF  . Shortness of breath    ?? chest  cold he has now.  10/8- no SOB    Past Surgical History:  Procedure Laterality Date  . AMPUTATION Right 06/12/2013   Procedure: Transtibial Amputation;  Surgeon: Newt Minion, MD;  Location: [place];  Service: Orthopedics;  Laterality: Right;  . AMPUTATION Right 06/27/2013   Procedure: AMPUTATION ABOVE KNEE;  Surgeon: Newt Minion, MD;  Location: [place];  Service: Orthopedics;  Laterality: Right;  Right Above Knee Amputation  . AV FISTULA PLACEMENT Left 04/07/2012   Procedure: ARTERIOVENOUS (AV) FISTULA CREATION;  Surgeon: Angelia Mould, MD;  Location: [place];  Service: Vascular;  Laterality: Left;  . FISTULOGRAM Left 08/07/2012   Procedure: FISTULOGRAM;  Surgeon: Angelia Mould, MD;  Location: [facility] CATH LAB;  Service:  Cardiovascular;  Laterality: Left;  arm  . I&D EXTREMITY Right 06/02/2013   Procedure: IRRIGATION AND DEBRIDEMENT ARTHROSCOPIC RIGHT ANKLE;  Surgeon: Augustin Schooling, MD;  Location: [place];  Service: Orthopedics;  Laterality: Right;  . I&D EXTREMITY Right 06/04/2013   Procedure: IRRIGATION AND DEBRIDEMENT RIGHT FOOT/ANKLE;  Surgeon: Newt Minion, MD;  Location: [place];  Service: Orthopedics;  Laterality: Right;  . I&D EXTREMITY Right 06/06/2013   Procedure: IRRIGATION AND DEBRIDEMENT EXTREMITY;  Surgeon: Newt Minion, MD;  Location: [place];  Service: Orthopedics;  Laterality: Right;  . I&D EXTREMITY Right 06/24/2013   Procedure: IRRIGATION AND DEBRIDEMENT and Revsion of TRANSTIBIAL AMPUTATION;  Surgeon: Newt Minion, MD;  Location: [place];  Service: Orthopedics;  Laterality: Right;  . INSERTION OF DIALYSIS CATHETER Right 09/19/2012   Procedure: INSERTION OF DIALYSIS CATHETER;  Surgeon: Angelia Mould, MD;  Location: [place];  Service: Vascular;  Laterality: Right;  . LIGATION OF COMPETING BRANCHES OF ARTERIOVENOUS FISTULA Left 09/19/2012   Procedure: LIGATION OF COMPETING BRANCHES OF LEFT ARM ARTERIOVENOUS FISTULA; Vein angioplasty;  Surgeon: Angelia Mould, MD;  Location: [place];  Service: Vascular;  Laterality: Left;  . none      There were no vitals filed for this visit.  Subjective Assessment - 07/30/16 1020    Subjective Denies any changes or falls since last visit. Patient reports he is back to work, and reports the SOB he was feeling intermittently while back at work had improved.    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    Currently in Pain? No/denies            [facility] Adult PT Treatment/Exercise - 07/30/16 1015      Transfers   Transfers Sit to  Stand;Stand to Sit   Sit to Stand 5: Supervision;With upper extremity assist;From chair/3-in-1;With armrests   Stand to Sit 5: Supervision;With upper extremity assist;With armrests;To chair/3-in-1   Stand to Sit Details (indicate cue type and reason) Verbal cues for sequencing;Verbal cues for technique;Verbal cues for precautions/safety   Comments --     Ambulation/Gait   Ambulation/Gait Yes   Ambulation/Gait Assistance 5: Supervision   Ambulation/Gait Assistance Details Requires cueing for lateral/fwd weight shift over prosthesis to consistently engage hydraulic knee    Ambulation Distance (Feet) 500 Feet  and short distances around clinic between various tasks    Assistive device Prosthesis;None   Gait Pattern Step-through pattern;Decreased arm swing - right;Decreased step length - left;Decreased stance time - right;Decreased stride length;Decreased hip/knee flexion - right;Decreased weight shift to right;Right hip hike;Trunk flexed;Abducted- right;Lateral trunk lean to right   Ambulation Surface Level;Unlevel;Indoor;Outdoor;Paved;Grass   Stairs --   Manufacturing systems engineer --   Stair Management Technique --   Number of Stairs --     Therapeutic Activites    Therapeutic Activities Lifting   Lifting Performed blocked practice retrieving/placing object in the floor. Started with a light object, then progressed to 2# ball,, then to lifting a weighted rod (1# cuff weight on each end of rod) requiring BUEs to lift. Required cueing for weight shift over prosthesis to engage hydraulic knee.      Neuro Re-ed    Neuro Re-ed Details  Performed fwd step downs on 12* and 6* incline from 4" step in parallel bars. Requires BUE support on bars and demo with verbal cueing for proper lateral/fwd weight shift over prosthesis.      Exercises   Exercises Knee/Hip   Other Exercises  --     Knee/Hip Exercises: Stretches   ITB Stretch Left;2 reps;30 seconds   ITB Stretch Limitations Introduced standing ITB  stretch to patient due to complaints of mild discomfort and fatigue in L hip especially when patient is at work/on his feet.     Prosthetics   Current prosthetic wear tolerance (days/week)  7 days /wk   Residual limb condition  limb is intact; abdominal incision has healed with steristrips have fallen off   Education Provided Residual limb care;Proper weight-bearing schedule/adjustment                  PT Short Term Goals - 07/14/16 0955      PT SHORT TERM GOAL #1   Title Patient verbalizes & demonstrates understanding of updated HEP. (Target Date: 07/16/2016)    Baseline MET 07/14/2016    Time 4   Period Weeks   Status Achieved     PT SHORT TERM GOAL #2   Title Patient ambulates with head turns to scan environment maintaining path & pace with cues. (Target Date: 07/16/2016)   Baseline MET 07/13/2016    Time  4   Period Weeks   Status Achieved     PT SHORT TERM GOAL #3   Title Four-Square step test <14 sec to indicate lower fall risk.  (Target Date: 07/16/2016)   Baseline MET 07/14/2016 Four square step test = 13.6s   Time 4   Period Weeks   Status Achieved     PT SHORT TERM GOAL #4   Title Patient demonstrates proper step position & weight shift with direction changes with verbal cues only from PT. (Target Date: 07/16/2016)   Baseline MET 07/14/2016   Time 4   Period Weeks   Status Achieved     PT SHORT TERM GOAL #5   Title Patient descends stairs with 2 rails reciprocally with prosthetic knee control. (Target Date: 07/16/2016)   Baseline MET 07/14/2016   Time 4   Period Weeks   Status Achieved           PT Long Term Goals - 06/17/16 1813      PT LONG TERM GOAL #1   Title Patient ambulates at comfortable gait velocity >4.0 ft/sec and increases to fast pace >5.0 ft/sec safely. (Target Date: 08/15/2016)   Time 8   Period Weeks   Status On-going     PT LONG TERM GOAL #2   Title Patient negotiates ramps & curbs with prosthesis only and descend stairs with 1 rail  modified independent.   (Target Date: 08/15/2016)   Time 8   Period Weeks   Status On-going     PT LONG TERM GOAL #3   Title Berg Balance >54/56 to decrease fall risk  (Target Date: 08/15/2016)   Time 8   Period Weeks   Status On-going     PT LONG TERM GOAL #4   Title Functional Gait Assessment >25/30 to decrease fall risk.  (Target Date: 08/15/2016)   Time 8   Period Weeks   Status On-going     PT LONG TERM GOAL #5   Title Patient lifts & carries 25# 50', carries grocery bag on stairs with 1 rail and performs work simulation tasks safely, independently. (Target Date: 08/15/2016)   Time 8   Period Weeks   Status On-going               Plan - 07/30/16 1124    Clinical Impression Statement Today's skilled PT session focused on advancing patient's prosthetic training with emphasis on consistent, proper engagement of hydraulic knee via lifting activities and stepping on inclines. Patient has returned to work (part -time), and reports he feels mild discomfort in his L leg due to improper weight shift over prosthesis (R side). PT introduced standing ITB stretch to aid in decreasing "tightness" felt in LLE. Patient reported it "felt good" and helped relieve some discomfort in L hip. Patient is making progress towards goals, and will benefit from continued skilled PT.    Rehab Potential Good   PT Frequency 1x / week   PT Duration 8 weeks   PT Treatment/Interventions ADLs/Self Care Home Management;Gait training;Stair training;Functional mobility training;Therapeutic activities;Therapeutic exercise;Balance training;Neuromuscular re-education;Patient/family education;Prosthetic Training   PT Next Visit Plan emphasize hydraulic knee engagement in sit <> stand transfers, inclines, and stairs; progress obstacle negotiation as appropriate    Consulted and Agree with Plan of Care Patient      Patient will benefit from skilled therapeutic intervention in order to improve the following deficits  and impairments:  Abnormal gait, Decreased activity tolerance, Decreased balance, Decreased mobility, Decreased safety awareness, Postural dysfunction, Prosthetic  Dependency, Decreased range of motion  Visit Diagnosis: Other abnormalities of gait and mobility  Unsteadiness on feet  Repeated falls  Muscle weakness (generalized)     Problem List Patient Active Problem List   Diagnosis Date Noted  . History of right above knee amputation ([place]) 07/12/2016  . Acute blood loss anemia 06/25/2013  . Hyperglycemia 06/07/2013  . Arm DVT (deep venous thromboembolism), acute ([place]) 06/07/2013  . ESRD on dialysis ([place]) 06/05/2013  . Encephalopathy, toxic 06/03/2013  . Cellulitis and abscess of leg 06/03/2013  . Anemia in chronic kidney disease 06/03/2013  . Arm edema 06/03/2013  . Leg abscess 06/03/2013  . Severe sepsis ([place]) 06/03/2013  . Septic shock ([place]) 06/03/2013  . Septic arthritis of ankle or foot, right 06/03/2013  . Abscess of right lower leg 06/02/2013  . Wrist lump 07/05/2012  . Swelling of limb-Left index and middle fingers 07/05/2012  . Aftercare following surgery of the circulatory system, [place] 07/05/2012  . Tingling-left wrist and some pain. 07/05/2012  . Pulmonary edema 05/04/2012  . Acute respiratory failure ([place]) 05/04/2012  . CKD (chronic kidney disease) stage 5, GFR less than 15 ml/min (HCC) 05/04/2012  . Hyperkalemia 05/04/2012  . Pre-operative cardiovascular examination 03/15/2012  . Iron deficiency anemia 09/22/2010  . HTN (hypertension) 09/22/2010  . Hyperlipidemia 09/22/2010  . Gout 09/22/2010  . CRI (chronic renal insufficiency) 09/22/2010  . Pulmonary embolism, bilateral ([place]) 09/22/2010    Arelia Sneddon, SPT  07/30/2016, 11:25 AM  [facility] [address] [place], [place], [address] Phone: [phone]   Fax:  [phone]  Name: Steven Logan MRN: [id number] Date of Birth:  09-15-1970

## 2016-07-30 NOTE — Patient Instructions (Signed)
Iliotibial Band Stretch    Stand with right hip ____ inches from wall. Use your arm on the wall and holding onto the back of a chair/countertop for support. Lean toward wall until a stretch is felt on outside of hip near wall. Keep that leg straight. Hold _30___ seconds. Repeat __3__ times. Do __1-2__ sessions per day. Place feet far enough away from the wall so that it is comfortable on your knee.   http://gt2.exer.us/354   Copyright  VHI. All rights reserved.

## 2016-08-03 ENCOUNTER — Ambulatory Visit: Payer: Medicare Other | Admitting: Physical Therapy

## 2016-08-03 DIAGNOSIS — D631 Anemia in chronic kidney disease: Secondary | ICD-10-CM | POA: Diagnosis not present

## 2016-08-03 DIAGNOSIS — Z7982 Long term (current) use of aspirin: Secondary | ICD-10-CM | POA: Diagnosis not present

## 2016-08-03 DIAGNOSIS — Z4822 Encounter for aftercare following kidney transplant: Secondary | ICD-10-CM | POA: Diagnosis not present

## 2016-08-03 DIAGNOSIS — D899 Disorder involving the immune mechanism, unspecified: Secondary | ICD-10-CM | POA: Diagnosis not present

## 2016-08-03 DIAGNOSIS — E1122 Type 2 diabetes mellitus with diabetic chronic kidney disease: Secondary | ICD-10-CM | POA: Diagnosis not present

## 2016-08-03 DIAGNOSIS — E872 Acidosis: Secondary | ICD-10-CM | POA: Diagnosis not present

## 2016-08-03 DIAGNOSIS — I120 Hypertensive chronic kidney disease with stage 5 chronic kidney disease or end stage renal disease: Secondary | ICD-10-CM | POA: Diagnosis not present

## 2016-08-03 DIAGNOSIS — D8989 Other specified disorders involving the immune mechanism, not elsewhere classified: Secondary | ICD-10-CM | POA: Diagnosis not present

## 2016-08-03 DIAGNOSIS — Z79899 Other long term (current) drug therapy: Secondary | ICD-10-CM | POA: Diagnosis not present

## 2016-08-03 DIAGNOSIS — Z7984 Long term (current) use of oral hypoglycemic drugs: Secondary | ICD-10-CM | POA: Diagnosis not present

## 2016-08-03 DIAGNOSIS — Z992 Dependence on renal dialysis: Secondary | ICD-10-CM | POA: Diagnosis not present

## 2016-08-03 DIAGNOSIS — Z7952 Long term (current) use of systemic steroids: Secondary | ICD-10-CM | POA: Diagnosis not present

## 2016-08-03 DIAGNOSIS — Z940 Kidney transplant status: Secondary | ICD-10-CM | POA: Diagnosis not present

## 2016-08-03 DIAGNOSIS — N186 End stage renal disease: Secondary | ICD-10-CM | POA: Diagnosis not present

## 2016-08-03 DIAGNOSIS — E875 Hyperkalemia: Secondary | ICD-10-CM | POA: Diagnosis not present

## 2016-08-04 ENCOUNTER — Encounter: Payer: Medicare Other | Admitting: Physical Therapy

## 2016-08-10 ENCOUNTER — Encounter: Payer: BLUE CROSS/BLUE SHIELD | Admitting: Physical Therapy

## 2016-08-11 ENCOUNTER — Ambulatory Visit: Payer: Medicare Other | Admitting: Physical Therapy

## 2016-08-11 ENCOUNTER — Encounter: Payer: Self-pay | Admitting: Physical Therapy

## 2016-08-11 DIAGNOSIS — R296 Repeated falls: Secondary | ICD-10-CM | POA: Diagnosis not present

## 2016-08-11 DIAGNOSIS — M25651 Stiffness of right hip, not elsewhere classified: Secondary | ICD-10-CM | POA: Diagnosis not present

## 2016-08-11 DIAGNOSIS — R2681 Unsteadiness on feet: Secondary | ICD-10-CM

## 2016-08-11 DIAGNOSIS — R2689 Other abnormalities of gait and mobility: Secondary | ICD-10-CM

## 2016-08-11 DIAGNOSIS — M6281 Muscle weakness (generalized): Secondary | ICD-10-CM | POA: Diagnosis not present

## 2016-08-11 NOTE — Therapy (Addendum)
[facility] [address] [place], [place], [address] Phone: [phone]   Fax:  [phone]  Physical Therapy Treatment  Patient Details  Name: Steven Logan MRN: [id number] Date of Birth: August 08, 1970 Referring Provider: Beryle Lathe, MD  Encounter Date: 08/11/2016    Past Medical History:  Diagnosis Date  . Abscess    great toe  . Anemia   . Arthritis   . Chronic kidney disease    esrd  . Depression   . Diabetes mellitus   . Dyslipidemia   . Eczema   . Gout   . HTN (hypertension)   . Morbid obesity ([facility])   . Pancreatitis   . Pneumonia    2-3 years ago  . Pulmonary embolism ([facility])    3  in  lungs  at  one time...  . Renal hypertension    Hemo started  Sept 2014- MWF  . Shortness of breath    ?? chest  cold he has now.  10/8- no SOB    Past Surgical History:  Procedure Laterality Date  . AMPUTATION Right 06/12/2013   Procedure: Transtibial Amputation;  Surgeon: Nadara Mustard, MD;  Location: [facility] OR;  Service: Orthopedics;  Laterality: Right;  . AMPUTATION Right 06/27/2013   Procedure: AMPUTATION ABOVE KNEE;  Surgeon: Nadara Mustard, MD;  Location: [facility] OR;  Service: Orthopedics;  Laterality: Right;  Right Above Knee Amputation  . AV FISTULA PLACEMENT Left 04/07/2012   Procedure: ARTERIOVENOUS (AV) FISTULA CREATION;  Surgeon: Chuck Hint, MD;  Location: [facility] OR;  Service: Vascular;  Laterality: Left;  . FISTULOGRAM Left 08/07/2012   Procedure: FISTULOGRAM;  Surgeon: Chuck Hint, MD;  Location: [facility] CATH LAB;  Service: Cardiovascular;  Laterality: Left;  arm  . I&D EXTREMITY Right 06/02/2013   Procedure: IRRIGATION AND DEBRIDEMENT ARTHROSCOPIC RIGHT ANKLE;  Surgeon: Verlee Rossetti, MD;  Location: [facility] OR;  Service: Orthopedics;  Laterality: Right;  . I&D EXTREMITY Right 06/04/2013   Procedure: IRRIGATION AND DEBRIDEMENT RIGHT FOOT/ANKLE;  Surgeon: Nadara Mustard, MD;  Location: [facility] OR;  Service:  Orthopedics;  Laterality: Right;  . I&D EXTREMITY Right 06/06/2013   Procedure: IRRIGATION AND DEBRIDEMENT EXTREMITY;  Surgeon: Nadara Mustard, MD;  Location: [facility] OR;  Service: Orthopedics;  Laterality: Right;  . I&D EXTREMITY Right 06/24/2013   Procedure: IRRIGATION AND DEBRIDEMENT and Revsion of TRANSTIBIAL AMPUTATION;  Surgeon: Nadara Mustard, MD;  Location: [facility] OR;  Service: Orthopedics;  Laterality: Right;  . INSERTION OF DIALYSIS CATHETER Right 09/19/2012   Procedure: INSERTION OF DIALYSIS CATHETER;  Surgeon: Chuck Hint, MD;  Location: [facility] OR;  Service: Vascular;  Laterality: Right;  . LIGATION OF COMPETING BRANCHES OF ARTERIOVENOUS FISTULA Left 09/19/2012   Procedure: LIGATION OF COMPETING BRANCHES OF LEFT ARM ARTERIOVENOUS FISTULA; Vein angioplasty;  Surgeon: Chuck Hint, MD;  Location: [facility] OR;  Service: Vascular;  Laterality: Left;  . none      There were no vitals filed for this visit.                   PT Short Term Goals - 08/11/16 1906      PT SHORT TERM GOAL #1   Title Patient descends stairs with 2 rails with reciprocal pattern with control. (Target Date: 09/10/2016)   Time 4   Period Weeks   Status New     PT SHORT TERM GOAL #2   Title Patient ambulates on grass with head turns to scan  environment maintaining path & pace with supervision (Target Date: 09/10/2016)   Time 4   Period Weeks   Status New     PT SHORT TERM GOAL #3   Title Patient ambulates at fast pace > 4.3 ft/sec.  (Target Date: 09/10/2016)   Time 4   Period Weeks   Status New           PT Long Term Goals - 08/11/16 6144      PT LONG TERM GOAL #1   Title Patient ambulates at comfortable gait velocity >4.0 ft/sec and increases to fast pace >5.0 ft/sec safely. (Target Date: 10/08/2016)   Baseline 08/11/16: 3.72 ft/sec no AD at comforable speed, 4.06 ft/sec at fast pace.   Time --   Period --   Status On-going     PT LONG TERM GOAL #2   Title Patient negotiates ramps &  curbs with prosthesis only and descend stairs reciprocally with 1 rail modified independent.   (Target Date: 10/08/2016)   Baseline 08/11/16: ram and curb with supervision with prosthesis only, needs 2 rails with descending stairs   Time --   Period --   Status On-going     PT LONG TERM GOAL #3   Title Berg Balance >54/56 to decrease fall risk  (Target Date: 08/15/2016)   Baseline 08/11/16: 54/56 scored today   Time --   Period --   Status Achieved     PT LONG TERM GOAL #4   Title Functional Gait Assessment >25/30 to decrease fall risk. (Target Date: 10/08/2016)   Baseline 08/11/16: 23/30 scored today, improved from last check of 21/30, just not to goal   Time --   Period --   Status Partially Met     PT LONG TERM GOAL #5   Title Patient lifts & carries 25# 50', carries grocery bag on stairs with 1 rail and performs work simulation tasks safely, independently. (Target Date: 10/08/2016)   Baseline 08/11/16: not tested today due to time constraints with other testing  PT continued LTG   Time 8   Period Weeks   Status On-going             Patient will benefit from skilled therapeutic intervention in order to improve the following deficits and impairments:  Abnormal gait, Decreased activity tolerance, Decreased balance, Decreased mobility, Decreased safety awareness, Postural dysfunction, Prosthetic Dependency, Decreased range of motion  Visit Diagnosis: Other abnormalities of gait and mobility  Unsteadiness on feet  Repeated falls  Muscle weakness (generalized)  Stiffness of right hip, not elsewhere classified     Problem List Patient Active Problem List   Diagnosis Date Noted  . History of right above knee amputation ([place]) 07/12/2016  . Acute blood loss anemia 06/25/2013  . Hyperglycemia 06/07/2013  . Arm DVT (deep venous thromboembolism), acute ([place]) 06/07/2013  . ESRD on dialysis ([place]) 06/05/2013  . Encephalopathy, toxic 06/03/2013  . Cellulitis and abscess of leg  06/03/2013  . Anemia in chronic kidney disease 06/03/2013  . Arm edema 06/03/2013  . Leg abscess 06/03/2013  . Severe sepsis ([place]) 06/03/2013  . Septic shock ([place]) 06/03/2013  . Septic arthritis of ankle or foot, right 06/03/2013  . Abscess of right lower leg 06/02/2013  . Wrist lump 07/05/2012  . Swelling of limb-Left index and middle fingers 07/05/2012  . Aftercare following surgery of the circulatory system, [place] 07/05/2012  . Tingling-left wrist and some pain. 07/05/2012  . Pulmonary edema 05/04/2012  . Acute respiratory failure ([place]) 05/04/2012  .  CKD (chronic kidney disease) stage 5, GFR less than 15 ml/min (HCC) 05/04/2012  . Hyperkalemia 05/04/2012  . Pre-operative cardiovascular examination 03/15/2012  . Iron deficiency anemia 09/22/2010  . HTN (hypertension) 09/22/2010  . Hyperlipidemia 09/22/2010  . Gout 09/22/2010  . CRI (chronic renal insufficiency) 09/22/2010  . Pulmonary embolism, bilateral ([place]) 09/22/2010    Willow Ora, PTA, [place] [address], [place],  [address] [phone] 08/11/16, 10:34 PM   Name: Steven Logan MRN: [id number] Date of Birth: 09-10-1970    08/11/16 0814  PT Visits / Re-Eval  Visit Number 12  Number of Visits 23  Date for PT Re-Evaluation 10/08/16  Authorization  Authorization Type Medicare G-code & progress note every 10 visits  PT Time Calculation  PT Start Time 0805  PT Stop Time 0845  PT Time Calculation (min) 40 min  PT - End of Session  Activity Tolerance Patient tolerated treatment well  Behavior During Therapy [facility] for tasks assessed/performed     08/11/16 3354  Plan  Clinical Impression Statement Today's skilled session addressed progress toward LTGs with Merrilee Jansky Balance Test goal met, other's demo' progress only. Primary PT to renew as pt should benefit from continued PT to progress toward updated goals of renewal.    ADDENDUM from Jamey Reas, PT, DPT  Patient has had  attendance problems due to kidney transplant appointments and fall with injury to kidney area. He has potential to meet higher level of function at level of LTGs with additional skilled PT.   Pt will benefit from skilled therapeutic intervention in order to improve on the following deficits Abnormal gait;Decreased activity tolerance;Decreased balance;Decreased mobility;Decreased safety awareness;Postural dysfunction;Prosthetic Dependency;Decreased range of motion  Rehab Potential Good  PT Frequency 1x / week  PT Duration 8 weeks  PT Treatment/Interventions ADLs/Self Care Home Management;Gait training;Stair training;Functional mobility training;Therapeutic activities;Therapeutic exercise;Balance training;Neuromuscular re-education;Patient/family education;Prosthetic Training  PT Next Visit Plan emphasize hydraulic knee engagement in sit <> stand transfers, inclines, and stairs; progress obstacle negotiation as appropriate   Consulted and Agree with Plan of Care Patient       PT Short Term Goals - 08/11/16 1906      PT SHORT TERM GOAL #1   Title Patient descends stairs with 2 rails with reciprocal pattern with control. (Target Date: 09/10/2016)   Time 4   Period Weeks   Status New     PT SHORT TERM GOAL #2   Title Patient ambulates on grass with head turns to scan environment maintaining path & pace with supervision (Target Date: 09/10/2016)   Time 4   Period Weeks   Status New     PT SHORT TERM GOAL #3   Title Patient ambulates at fast pace > 4.3 ft/sec.  (Target Date: 09/10/2016)   Time 4   Period Weeks   Status New          PT Long Term Goals - 08/11/16 5625      PT LONG TERM GOAL #1   Title Patient ambulates at comfortable gait velocity >4.0 ft/sec and increases to fast pace >5.0 ft/sec safely. (Target Date: 10/08/2016)   Baseline 08/11/16: 3.72 ft/sec no AD at comforable speed, 4.06 ft/sec at fast pace.   Time --   Period --   Status On-going     PT LONG TERM GOAL #2    Title Patient negotiates ramps & curbs with prosthesis only and descend stairs reciprocally with 1 rail modified independent.   (Target Date: 10/08/2016)  Baseline 08/11/16: ram and curb with supervision with prosthesis only, needs 2 rails with descending stairs   Time --   Period --   Status On-going     PT LONG TERM GOAL #3   Title Berg Balance >54/56 to decrease fall risk  (Target Date: 08/15/2016)   Baseline 08/11/16: 54/56 scored today   Time --   Period --   Status Achieved     PT LONG TERM GOAL #4   Title Functional Gait Assessment >25/30 to decrease fall risk. (Target Date: 10/08/2016)   Baseline 08/11/16: 23/30 scored today, improved from last check of 21/30, just not to goal   Time --   Period --   Status Partially Met     PT LONG TERM GOAL #5   Title Patient lifts & carries 25# 50', carries grocery bag on stairs with 1 rail and performs work simulation tasks safely, independently. (Target Date: 10/08/2016)   Baseline 08/11/16: not tested today due to time constraints with other testing  PT continued LTG   Time 8   Period Weeks   Status On-going     Jamey Reas, PT, DPT PT Specializing in [place] 08/12/16 7:14 PM Phone:  [phone]  Fax:  [phone] [place] [address] [place], [place] [address]

## 2016-08-12 NOTE — Addendum Note (Signed)
Addended by: Isaias Cowman on: 08/12/2016 07:16 PM   Modules accepted: Orders

## 2016-08-13 DIAGNOSIS — Z940 Kidney transplant status: Secondary | ICD-10-CM | POA: Diagnosis not present

## 2016-08-16 ENCOUNTER — Ambulatory Visit: Payer: Medicare Other | Admitting: Physical Therapy

## 2016-08-16 DIAGNOSIS — R2681 Unsteadiness on feet: Secondary | ICD-10-CM | POA: Diagnosis not present

## 2016-08-16 DIAGNOSIS — M6281 Muscle weakness (generalized): Secondary | ICD-10-CM

## 2016-08-16 DIAGNOSIS — R296 Repeated falls: Secondary | ICD-10-CM | POA: Diagnosis not present

## 2016-08-16 DIAGNOSIS — M25651 Stiffness of right hip, not elsewhere classified: Secondary | ICD-10-CM | POA: Diagnosis not present

## 2016-08-16 DIAGNOSIS — R2689 Other abnormalities of gait and mobility: Secondary | ICD-10-CM

## 2016-08-16 NOTE — Therapy (Signed)
[facility] [address] [place], [place], [address] Phone: [phone]   Fax:  [phone]  Physical Therapy Treatment  Patient Details  Name: Steven Logan MRN: [id number] Date of Birth: 27-Jun-1970 Referring Provider: Mauricia Area, MD  Encounter Date: 08/16/2016      PT End of Session - 08/16/16 1621    Visit Number 13   Number of Visits 23   Date for PT Re-Evaluation 10/08/16   Authorization Type Medicare G-code & progress note every 10 visits   PT Start Time 1536   PT Stop Time 1618   PT Time Calculation (min) 42 min   Equipment Utilized During Treatment Gait belt   Activity Tolerance Patient tolerated treatment well   Behavior During Therapy [facility] for tasks assessed/performed      Past Medical History:  Diagnosis Date  . Abscess    great toe  . Anemia   . Arthritis   . Chronic kidney disease    esrd  . Depression   . Diabetes mellitus   . Dyslipidemia   . Eczema   . Gout   . HTN (hypertension)   . Morbid obesity ([place])   . Pancreatitis   . Pneumonia    2-3 years ago  . Pulmonary embolism ([place])    3  in  lungs  at  one time...  . Renal hypertension    Hemo started  Sept 2014- MWF  . Shortness of breath    ?? chest  cold he has now.  10/8- no SOB    Past Surgical History:  Procedure Laterality Date  . AMPUTATION Right 06/12/2013   Procedure: Transtibial Amputation;  Surgeon: Newt Minion, MD;  Location: [place];  Service: Orthopedics;  Laterality: Right;  . AMPUTATION Right 06/27/2013   Procedure: AMPUTATION ABOVE KNEE;  Surgeon: Newt Minion, MD;  Location: [place];  Service: Orthopedics;  Laterality: Right;  Right Above Knee Amputation  . AV FISTULA PLACEMENT Left 04/07/2012   Procedure: ARTERIOVENOUS (AV) FISTULA CREATION;  Surgeon: Angelia Mould, MD;  Location: [place];  Service: Vascular;  Laterality: Left;  . FISTULOGRAM Left 08/07/2012   Procedure: FISTULOGRAM;  Surgeon:  Angelia Mould, MD;  Location: [facility] CATH LAB;  Service: Cardiovascular;  Laterality: Left;  arm  . I&D EXTREMITY Right 06/02/2013   Procedure: IRRIGATION AND DEBRIDEMENT ARTHROSCOPIC RIGHT ANKLE;  Surgeon: Augustin Schooling, MD;  Location: [place];  Service: Orthopedics;  Laterality: Right;  . I&D EXTREMITY Right 06/04/2013   Procedure: IRRIGATION AND DEBRIDEMENT RIGHT FOOT/ANKLE;  Surgeon: Newt Minion, MD;  Location: [place];  Service: Orthopedics;  Laterality: Right;  . I&D EXTREMITY Right 06/06/2013   Procedure: IRRIGATION AND DEBRIDEMENT EXTREMITY;  Surgeon: Newt Minion, MD;  Location: [place];  Service: Orthopedics;  Laterality: Right;  . I&D EXTREMITY Right 06/24/2013   Procedure: IRRIGATION AND DEBRIDEMENT and Revsion of TRANSTIBIAL AMPUTATION;  Surgeon: Newt Minion, MD;  Location: [place];  Service: Orthopedics;  Laterality: Right;  . INSERTION OF DIALYSIS CATHETER Right 09/19/2012   Procedure: INSERTION OF DIALYSIS CATHETER;  Surgeon: Angelia Mould, MD;  Location: [place];  Service: Vascular;  Laterality: Right;  . LIGATION OF COMPETING BRANCHES OF ARTERIOVENOUS FISTULA Left 09/19/2012   Procedure: LIGATION OF COMPETING BRANCHES OF LEFT ARM ARTERIOVENOUS FISTULA; Vein angioplasty;  Surgeon: Angelia Mould, MD;  Location: [place];  Service: Vascular;  Laterality: Left;  . none      There were no  vitals filed for this visit.      Subjective Assessment - 08/16/16 1539    Subjective Patient has stated that he is eating more and is gaining weight back. He is still having trouble with the fit of his prosthesis and plans to call [facility] soon.    Currently in Pain? No/denies               [facility] Adult PT Treatment/Exercise - 08/16/16 1645      Ambulation/Gait   Ambulation/Gait Yes   Ambulation/Gait Assistance 6: Modified independent (Device/Increase time);5: Supervision   Ambulation Distance (Feet) --  around gym   Assistive device Prosthesis;None   Gait Pattern  Step-through pattern;Decreased arm swing - right;Decreased step length - left;Decreased stance time - right;Decreased stride length;Decreased hip/knee flexion - right;Decreased weight shift to right;Right hip hike;Trunk flexed;Abducted- right;Lateral trunk lean to right   Ambulation Surface Level;Unlevel;Indoor   Stairs Yes   Stairs Assistance 5: Supervision   Stairs Assistance Details (indicate cue type and reason) cues for weight shifting onto left leg to engage prosthetic knee    Stair Management Technique One rail Right;Two rails;Alternating pattern;Step to pattern   Number of Stairs 4  blocked practice on stairs    Ramp 6: Modified independent (Device);5: Supervision   Ramp Details (indicate cue type and reason) cues to correct abducted gait on ramp and to weight shfit to engage prosthesis    Curb 5: Supervision;6: Modified independent (Device/increase time)   Gait Comments blocked practice walking around gym, going up and down stairs, up and down ramp/curb; cues for proper weight shifting to engage prosthesis, to correct abducted gait and to slow down to control his movements      Therapeutic Activites    Therapeutic Activities Lifting   Lifting blocked practice with bending to lift crate (with 10#) up and put it back down on floor, mini squats in front of chair in parallel bars, performed forward step downs on 4" step to 6* incline with the purpose of engaging prosthetic knee in controlled manner; cues for proper weight shifting to engage prosthetic knee but to avoid shifting too far forward and falling, needed verbal and tactile cues to shift hips backward while bending down to complete mini squats and while bending to pick up objects from floor      Neuro Re-ed    Neuro Re-ed Details  blocked practice with walking around gym performing head turns left <> right, up <> down on command and completing figure 8s around two stools ~56f apart; cues to correct abducted gait and for proper weight  shifting              PT Short Term Goals - 08/11/16 1906      PT SHORT TERM GOAL #1   Title Patient descends stairs with 2 rails with reciprocal pattern with control. (Target Date: 09/10/2016)   Time 4   Period Weeks   Status New     PT SHORT TERM GOAL #2   Title Patient ambulates on grass with head turns to scan environment maintaining path & pace with supervision (Target Date: 09/10/2016)   Time 4   Period Weeks   Status New     PT SHORT TERM GOAL #3   Title Patient ambulates at fast pace > 4.3 ft/sec.  (Target Date: 09/10/2016)   Time 4   Period Weeks   Status New           PT Long Term Goals - 08/11/16 05329  PT LONG TERM GOAL #1   Title Patient ambulates at comfortable gait velocity >4.0 ft/sec and increases to fast pace >5.0 ft/sec safely. (Target Date: 10/08/2016)   Baseline 08/11/16: 3.72 ft/sec no AD at comforable speed, 4.06 ft/sec at fast pace.   Time --   Period --   Status On-going     PT LONG TERM GOAL #2   Title Patient negotiates ramps & curbs with prosthesis only and descend stairs reciprocally with 1 rail modified independent.   (Target Date: 10/08/2016)   Baseline 08/11/16: ram and curb with supervision with prosthesis only, needs 2 rails with descending stairs   Time --   Period --   Status On-going     PT LONG TERM GOAL #3   Title Berg Balance >54/56 to decrease fall risk  (Target Date: 08/15/2016)   Baseline 08/11/16: 54/56 scored today   Time --   Period --   Status Achieved     PT LONG TERM GOAL #4   Title Functional Gait Assessment >25/30 to decrease fall risk. (Target Date: 10/08/2016)   Baseline 08/11/16: 23/30 scored today, improved from last check of 21/30, just not to goal   Time --   Period --   Status Partially Met     PT LONG TERM GOAL #5   Title Patient lifts & carries 25# 50', carries grocery bag on stairs with 1 rail and performs work simulation tasks safely, independently. (Target Date: 10/08/2016)   Baseline 08/11/16: not  tested today due to time constraints with other testing  PT continued LTG   Time 8   Period Weeks   Status On-going               Plan - 08/16/16 1622    Clinical Impression Statement Today's session focused on reptitive activities to help reinforce proper prosthetic sequence and weight shifting to engage the patient's prosthetic knee. patient needs consistent cueing to put weight onto left leg in order to bend his knee. Various positions and activites were attempted to teach proper squat form to engage knees and hips with patient verbally understanding but requiring verbal and tactile cues to correct his form. Patient would benefit from continued PT to address residual deficits and to progress toward unmet goals.       Rehab Potential Good   PT Frequency 1x / week   PT Duration 8 weeks   PT Treatment/Interventions ADLs/Self Care Home Management;Gait training;Stair training;Functional mobility training;Therapeutic activities;Therapeutic exercise;Balance training;Neuromuscular re-education;Patient/family education;Prosthetic Training   PT Next Visit Plan emphasize hydraulic knee engagement in sit <> stand transfers, inclines, and stairs; progress obstacle negotiation as appropriate    Consulted and Agree with Plan of Care Patient      Patient will benefit from skilled therapeutic intervention in order to improve the following deficits and impairments:  Abnormal gait, Decreased activity tolerance, Decreased balance, Decreased mobility, Decreased safety awareness, Postural dysfunction, Prosthetic Dependency, Decreased range of motion  Visit Diagnosis: Other abnormalities of gait and mobility  Unsteadiness on feet  Repeated falls  Muscle weakness (generalized)     Problem List Patient Active Problem List   Diagnosis Date Noted  . History of right above knee amputation ([place]) 07/12/2016  . Acute blood loss anemia 06/25/2013  . Hyperglycemia 06/07/2013  . Arm DVT (deep venous  thromboembolism), acute ([place]) 06/07/2013  . ESRD on dialysis ([place]) 06/05/2013  . Encephalopathy, toxic 06/03/2013  . Cellulitis and abscess of leg 06/03/2013  . Anemia in chronic kidney disease 06/03/2013  .  Arm edema 06/03/2013  . Leg abscess 06/03/2013  . Severe sepsis ([place]) 06/03/2013  . Septic shock ([place]) 06/03/2013  . Septic arthritis of ankle or foot, right 06/03/2013  . Abscess of right lower leg 06/02/2013  . Wrist lump 07/05/2012  . Swelling of limb-Left index and middle fingers 07/05/2012  . Aftercare following surgery of the circulatory system, [place] 07/05/2012  . Tingling-left wrist and some pain. 07/05/2012  . Pulmonary edema 05/04/2012  . Acute respiratory failure ([place]) 05/04/2012  . CKD (chronic kidney disease) stage 5, GFR less than 15 ml/min (HCC) 05/04/2012  . Hyperkalemia 05/04/2012  . Pre-operative cardiovascular examination 03/15/2012  . Iron deficiency anemia 09/22/2010  . HTN (hypertension) 09/22/2010  . Hyperlipidemia 09/22/2010  . Gout 09/22/2010  . CRI (chronic renal insufficiency) 09/22/2010  . Pulmonary embolism, bilateral ([place]) 09/22/2010    Marijean Niemann 08/16/2016, 5:00 PM  [facility] [address] [place], [place], [address] Phone: [phone]   Fax:  [phone]  Name: Steven Logan MRN: [id number] Date of Birth: 06-25-1970

## 2016-08-17 ENCOUNTER — Encounter: Payer: BLUE CROSS/BLUE SHIELD | Admitting: Physical Therapy

## 2016-08-18 DIAGNOSIS — Z7982 Long term (current) use of aspirin: Secondary | ICD-10-CM | POA: Diagnosis not present

## 2016-08-18 DIAGNOSIS — E872 Acidosis: Secondary | ICD-10-CM | POA: Diagnosis not present

## 2016-08-18 DIAGNOSIS — N186 End stage renal disease: Secondary | ICD-10-CM | POA: Diagnosis not present

## 2016-08-18 DIAGNOSIS — Z79899 Other long term (current) drug therapy: Secondary | ICD-10-CM | POA: Diagnosis not present

## 2016-08-18 DIAGNOSIS — Z4822 Encounter for aftercare following kidney transplant: Secondary | ICD-10-CM | POA: Diagnosis not present

## 2016-08-18 DIAGNOSIS — Z992 Dependence on renal dialysis: Secondary | ICD-10-CM | POA: Diagnosis not present

## 2016-08-18 DIAGNOSIS — D899 Disorder involving the immune mechanism, unspecified: Secondary | ICD-10-CM | POA: Diagnosis not present

## 2016-08-18 DIAGNOSIS — I120 Hypertensive chronic kidney disease with stage 5 chronic kidney disease or end stage renal disease: Secondary | ICD-10-CM | POA: Diagnosis not present

## 2016-08-18 DIAGNOSIS — D631 Anemia in chronic kidney disease: Secondary | ICD-10-CM | POA: Diagnosis not present

## 2016-08-18 DIAGNOSIS — Z7901 Long term (current) use of anticoagulants: Secondary | ICD-10-CM | POA: Diagnosis not present

## 2016-08-18 DIAGNOSIS — Z940 Kidney transplant status: Secondary | ICD-10-CM | POA: Diagnosis not present

## 2016-08-18 DIAGNOSIS — E1122 Type 2 diabetes mellitus with diabetic chronic kidney disease: Secondary | ICD-10-CM | POA: Diagnosis not present

## 2016-08-18 DIAGNOSIS — E875 Hyperkalemia: Secondary | ICD-10-CM | POA: Diagnosis not present

## 2016-08-23 ENCOUNTER — Encounter: Payer: Medicare Other | Admitting: Physical Therapy

## 2016-08-23 ENCOUNTER — Ambulatory Visit: Payer: Medicare Other | Admitting: Physical Therapy

## 2016-08-30 ENCOUNTER — Ambulatory Visit: Payer: Medicare Other | Admitting: Physical Therapy

## 2016-08-31 DIAGNOSIS — Z79899 Other long term (current) drug therapy: Secondary | ICD-10-CM | POA: Diagnosis not present

## 2016-08-31 DIAGNOSIS — Z7982 Long term (current) use of aspirin: Secondary | ICD-10-CM | POA: Diagnosis not present

## 2016-08-31 DIAGNOSIS — D72819 Decreased white blood cell count, unspecified: Secondary | ICD-10-CM | POA: Diagnosis not present

## 2016-08-31 DIAGNOSIS — Z992 Dependence on renal dialysis: Secondary | ICD-10-CM | POA: Diagnosis not present

## 2016-08-31 DIAGNOSIS — E875 Hyperkalemia: Secondary | ICD-10-CM | POA: Diagnosis not present

## 2016-08-31 DIAGNOSIS — N186 End stage renal disease: Secondary | ICD-10-CM | POA: Diagnosis not present

## 2016-08-31 DIAGNOSIS — E1122 Type 2 diabetes mellitus with diabetic chronic kidney disease: Secondary | ICD-10-CM | POA: Diagnosis not present

## 2016-08-31 DIAGNOSIS — Z794 Long term (current) use of insulin: Secondary | ICD-10-CM | POA: Diagnosis not present

## 2016-08-31 DIAGNOSIS — I10 Essential (primary) hypertension: Secondary | ICD-10-CM | POA: Diagnosis not present

## 2016-08-31 DIAGNOSIS — D899 Disorder involving the immune mechanism, unspecified: Secondary | ICD-10-CM | POA: Diagnosis not present

## 2016-08-31 DIAGNOSIS — E872 Acidosis: Secondary | ICD-10-CM | POA: Diagnosis not present

## 2016-08-31 DIAGNOSIS — Z940 Kidney transplant status: Secondary | ICD-10-CM | POA: Diagnosis not present

## 2016-08-31 DIAGNOSIS — D631 Anemia in chronic kidney disease: Secondary | ICD-10-CM | POA: Diagnosis not present

## 2016-08-31 DIAGNOSIS — D702 Other drug-induced agranulocytosis: Secondary | ICD-10-CM | POA: Insufficient documentation

## 2016-08-31 DIAGNOSIS — E119 Type 2 diabetes mellitus without complications: Secondary | ICD-10-CM | POA: Diagnosis not present

## 2016-08-31 DIAGNOSIS — I120 Hypertensive chronic kidney disease with stage 5 chronic kidney disease or end stage renal disease: Secondary | ICD-10-CM | POA: Diagnosis not present

## 2016-08-31 DIAGNOSIS — D649 Anemia, unspecified: Secondary | ICD-10-CM | POA: Diagnosis not present

## 2016-08-31 DIAGNOSIS — D8989 Other specified disorders involving the immune mechanism, not elsewhere classified: Secondary | ICD-10-CM | POA: Diagnosis not present

## 2016-08-31 DIAGNOSIS — Z4822 Encounter for aftercare following kidney transplant: Secondary | ICD-10-CM | POA: Diagnosis not present

## 2016-09-01 ENCOUNTER — Ambulatory Visit: Payer: Medicare Other | Attending: Nephrology | Admitting: Physical Therapy

## 2016-09-01 ENCOUNTER — Encounter: Payer: Self-pay | Admitting: Physical Therapy

## 2016-09-01 DIAGNOSIS — M6281 Muscle weakness (generalized): Secondary | ICD-10-CM

## 2016-09-01 DIAGNOSIS — R2689 Other abnormalities of gait and mobility: Secondary | ICD-10-CM | POA: Diagnosis not present

## 2016-09-01 DIAGNOSIS — R2681 Unsteadiness on feet: Secondary | ICD-10-CM | POA: Diagnosis not present

## 2016-09-01 NOTE — Therapy (Signed)
[facility] [address] [place], [place], [address] Phone: [phone]   Fax:  [phone]  Physical Therapy Treatment  Patient Details  Name: Steven Logan MRN: [id number] Date of Birth: Apr 30, 1970 Referring Provider: Mauricia Area, MD  Encounter Date: 09/01/2016      PT End of Session - 09/01/16 2051    Visit Number 14   Number of Visits 23   Date for PT Re-Evaluation 10/08/16   Authorization Type Medicare G-code & progress note every 10 visits   PT Start Time 1402   PT Stop Time 1448   PT Time Calculation (min) 46 min   Equipment Utilized During Treatment Gait belt   Activity Tolerance Patient tolerated treatment well   Behavior During Therapy [facility] for tasks assessed/performed      Past Medical History:  Diagnosis Date  . Abscess    great toe  . Anemia   . Arthritis   . Chronic kidney disease    esrd  . Depression   . Diabetes mellitus   . Dyslipidemia   . Eczema   . Gout   . HTN (hypertension)   . Morbid obesity ([place])   . Pancreatitis   . Pneumonia    2-3 years ago  . Pulmonary embolism ([place])    3  in  lungs  at  one time...  . Renal hypertension    Hemo started  Sept 2014- MWF  . Shortness of breath    ?? chest  cold he has now.  10/8- no SOB    Past Surgical History:  Procedure Laterality Date  . AMPUTATION Right 06/12/2013   Procedure: Transtibial Amputation;  Surgeon: Newt Minion, MD;  Location: [place];  Service: Orthopedics;  Laterality: Right;  . AMPUTATION Right 06/27/2013   Procedure: AMPUTATION ABOVE KNEE;  Surgeon: Newt Minion, MD;  Location: [place];  Service: Orthopedics;  Laterality: Right;  Right Above Knee Amputation  . AV FISTULA PLACEMENT Left 04/07/2012   Procedure: ARTERIOVENOUS (AV) FISTULA CREATION;  Surgeon: Angelia Mould, MD;  Location: [place];  Service: Vascular;  Laterality: Left;  . FISTULOGRAM Left 08/07/2012   Procedure: FISTULOGRAM;  Surgeon: Angelia Mould, MD;  Location[facility] CATH LAB;  Service: Cardiovascular;  Laterality: Left;  arm  . I&D EXTREMITY Right 06/02/2013   Procedure: IRRIGATION AND DEBRIDEMENT ARTHROSCOPIC RIGHT ANKLE;  Surgeon: Augustin Schooling, MD;  Location: [place];  Service: Orthopedics;  Laterality: Right;  . I&D EXTREMITY Right 06/04/2013   Procedure: IRRIGATION AND DEBRIDEMENT RIGHT FOOT/ANKLE;  Surgeon: Newt Minion, MD;  Location: [place];  Service: Orthopedics;  Laterality: Right;  . I&D EXTREMITY Right 06/06/2013   Procedure: IRRIGATION AND DEBRIDEMENT EXTREMITY;  Surgeon: Newt Minion, MD;  Location: [place];  Service: Orthopedics;  Laterality: Right;  . I&D EXTREMITY Right 06/24/2013   Procedure: IRRIGATION AND DEBRIDEMENT and Revsion of TRANSTIBIAL AMPUTATION;  Surgeon: Newt Minion, MD;  Location: [place];  Service: Orthopedics;  Laterality: Right;  . INSERTION OF DIALYSIS CATHETER Right 09/19/2012   Procedure: INSERTION OF DIALYSIS CATHETER;  Surgeon: Angelia Mould, MD;  Location: [place];  Service: Vascular;  Laterality: Right;  . LIGATION OF COMPETING BRANCHES OF ARTERIOVENOUS FISTULA Left 09/19/2012   Procedure: LIGATION OF COMPETING BRANCHES OF LEFT ARM ARTERIOVENOUS FISTULA; Vein angioplasty;  Surgeon: Angelia Mould, MD;  Location: [place];  Service: Vascular;  Laterality: Left;  . none      There were no  vitals filed for this visit.      Subjective Assessment - 09/01/16 1400    Subjective He reports he feels his limb size / weight has stabilized. His prosthetic suction suspension releases causing poor prosthesis control and requires UE to prevent falling off.    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    Currently in Pain? No/denies                         [facility]  Adult PT Treatment/Exercise - 09/01/16 1400      Ambulation/Gait   Ambulation/Gait Yes   Ambulation/Gait Assistance 5: Supervision   Ambulation/Gait Assistance Details verbal cues on clearance of prosthesis on grass. tactile cues on maintaining path on grass with head turns to scan.    Ambulation Distance (Feet) 500 Feet   Assistive device Prosthesis;None   Gait Pattern Step-through pattern;Decreased arm swing - right;Decreased step length - left;Decreased stance time - right;Decreased stride length;Decreased hip/knee flexion - right;Decreased weight shift to right;Right hip hike;Trunk flexed;Abducted- right;Lateral trunk lean to right   Ambulation Surface Indoor;Level;Outdoor;Paved;Gravel;Grass   Gait velocity 8.39 sec (poor suspension negatively effecting)  = 3.91 ft/sec   Stairs Yes   Stairs Assistance 5: Supervision   Stairs Assistance Details (indicate cue type and reason) verbal & tactile cues on technique to engage hydraulics.  Initially pt afraid and required manual /tactile cues  at knee.  Reciprocal /alternating with 2 rails only, step-to riding hydraulics with 1 rail (both lt & rt sides),  step-to modified technique if no rails available.    Stair Management Technique Two rails;Alternating pattern;One rail Right;One rail Left;Step to pattern;Forwards;No rails;Sideways   Number of Stairs 4  multiple reps >20   Ramp 5: Supervision  prosthesis only   Ramp Details (indicate cue type and reason) verbal cues on knee flexion ascend & descend   Curb 5: Supervision  prosthesis only   Curb Details (indicate cue type and reason) verbal cues on step length /position   Gait Comments --     High Level Balance   High Level Balance Activities Turns;Head turns  prosthesis only on grass surface   High Level Balance Comments verbal & tactile cues      Therapeutic Activites    Therapeutic Activities --   Lifting --     Neuro Re-ed    Neuro Re-ed Details  --     Prosthetics   Prosthetic  Care Comments  PT instructed in benefits to dual suspension currently with his limb size changing with recent kidney transplant. Donning prosthesis with TES belt for toileting.   PT phoned prosthetic company to discuss TES belt recommendation and plans to investigate insurance coverage.     Current prosthetic wear tolerance (days/week)  7 days /wk   Current prosthetic wear tolerance (#hours/day)  all awake hours   Residual limb condition  limb is intact;    Education Provided Other (comment)  see prosthetic care.    Person(s) Educated Patient   Education Method Explanation;Verbal cues   Education Method Verbalized understanding;Verbal cues required;Needs further instruction                PT Education - 09/01/16 1400    Education provided Yes   Education Details Use of TES belt as secondary suspension until ready for socket  revision. Need for limb volume stability as ~10# weight change from casting weight can cause poor fitting/ suspension of prosthesis.    Person(s) Educated Patient   Methods Explanation;Verbal cues   Comprehension Verbalized understanding;Verbal cues required          PT Short Term Goals - 09/01/16 2106      PT SHORT TERM GOAL #1   Title Patient descends stairs with 2 rails with reciprocal pattern with control. (Target Date: 09/10/2016)   Time 4   Period Weeks   Status On-going     PT SHORT TERM GOAL #2   Title Patient ambulates on grass with head turns to scan environment maintaining path & pace with supervision (Target Date: 09/10/2016)   Time 4   Period Weeks   Status On-going     PT SHORT TERM GOAL #3   Title Patient ambulates at fast pace > 4.3 ft/sec.  (Target Date: 09/10/2016)   Time 4   Period Weeks   Status On-going           PT Long Term Goals - 08/11/16 0814      PT LONG TERM GOAL #1   Title Patient ambulates at comfortable gait velocity >4.0 ft/sec and increases to fast pace >5.0 ft/sec safely. (Target Date: 10/08/2016)   Baseline  08/11/16: 3.72 ft/sec no AD at comforable speed, 4.06 ft/sec at fast pace.   Time --   Period --   Status On-going     PT LONG TERM GOAL #2   Title Patient negotiates ramps & curbs with prosthesis only and descend stairs reciprocally with 1 rail modified independent.   (Target Date: 10/08/2016)   Baseline 08/11/16: ram and curb with supervision with prosthesis only, needs 2 rails with descending stairs   Time --   Period --   Status On-going     PT LONG TERM GOAL #3   Title Berg Balance >54/56 to decrease fall risk  (Target Date: 08/15/2016)   Baseline 08/11/16: 54/56 scored today   Time --   Period --   Status Achieved     PT LONG TERM GOAL #4   Title Functional Gait Assessment >25/30 to decrease fall risk. (Target Date: 10/08/2016)   Baseline 08/11/16: 23/30 scored today, improved from last check of 21/30, just not to goal   Time --   Period --   Status Partially Met     PT LONG TERM GOAL #5   Title Patient lifts & carries 25# 50', carries grocery bag on stairs with 1 rail and performs work simulation tasks safely, independently. (Target Date: 10/08/2016)   Baseline 08/11/16: not tested today due to time constraints with other testing  PT continued LTG   Time 8   Period Weeks   Status On-going               Plan - 09/01/16 2107    Clinical Impression Statement Patient improved prosthesis control & confidence on stairs with skilled instruction. Pt would benefit from addition of TES belt due to suction suspension not holding with limb volume changes from kidney transplant.    Rehab Potential Good   PT Frequency 1x / week   PT Duration 8 weeks   PT Treatment/Interventions ADLs/Self Care Home Management;Gait training;Stair training;Functional mobility training;Therapeutic activities;Therapeutic exercise;Balance training;Neuromuscular re-education;Patient/family education;Prosthetic Training   PT Next Visit Plan assess STGs.    Consulted and Agree with Plan of Care Patient       Patient will benefit from skilled therapeutic intervention  in order to improve the following deficits and impairments:  Abnormal gait, Decreased activity tolerance, Decreased balance, Decreased mobility, Decreased safety awareness, Postural dysfunction, Prosthetic Dependency, Decreased range of motion  Visit Diagnosis: Other abnormalities of gait and mobility  Unsteadiness on feet  Muscle weakness (generalized)     Problem List Patient Active Problem List   Diagnosis Date Noted  . History of right above knee amputation ([place]) 07/12/2016  . Acute blood loss anemia 06/25/2013  . Hyperglycemia 06/07/2013  . Arm DVT (deep venous thromboembolism), acute ([place]) 06/07/2013  . ESRD on dialysis ([place]) 06/05/2013  . Encephalopathy, toxic 06/03/2013  . Cellulitis and abscess of leg 06/03/2013  . Anemia in chronic kidney disease 06/03/2013  . Arm edema 06/03/2013  . Leg abscess 06/03/2013  . Severe sepsis ([place]) 06/03/2013  . Septic shock ([place]) 06/03/2013  . Septic arthritis of ankle or foot, right 06/03/2013  . Abscess of right lower leg 06/02/2013  . Wrist lump 07/05/2012  . Swelling of limb-Left index and middle fingers 07/05/2012  . Aftercare following surgery of the circulatory system, [place] 07/05/2012  . Tingling-left wrist and some pain. 07/05/2012  . Pulmonary edema 05/04/2012  . Acute respiratory failure ([place]) 05/04/2012  . CKD (chronic kidney disease) stage 5, GFR less than 15 ml/min (HCC) 05/04/2012  . Hyperkalemia 05/04/2012  . Pre-operative cardiovascular examination 03/15/2012  . Iron deficiency anemia 09/22/2010  . HTN (hypertension) 09/22/2010  . Hyperlipidemia 09/22/2010  . Gout 09/22/2010  . CRI (chronic renal insufficiency) 09/22/2010  . Pulmonary embolism, bilateral ([place]) 09/22/2010    Royal Beirne PT, DPT 09/01/2016, 9:09 PM  [facility] [address] [place], [place], [address] Phone: [phone]    Fax:  [phone]  Name: Steven Logan MRN: [id number] Date of Birth: 10/23/1970

## 2016-09-14 DIAGNOSIS — Z79899 Other long term (current) drug therapy: Secondary | ICD-10-CM | POA: Diagnosis not present

## 2016-09-14 DIAGNOSIS — Z5181 Encounter for therapeutic drug level monitoring: Secondary | ICD-10-CM | POA: Diagnosis not present

## 2016-09-14 DIAGNOSIS — Z940 Kidney transplant status: Secondary | ICD-10-CM | POA: Diagnosis not present

## 2016-09-28 DIAGNOSIS — E872 Acidosis: Secondary | ICD-10-CM | POA: Diagnosis not present

## 2016-09-28 DIAGNOSIS — Z792 Long term (current) use of antibiotics: Secondary | ICD-10-CM | POA: Diagnosis not present

## 2016-09-28 DIAGNOSIS — L299 Pruritus, unspecified: Secondary | ICD-10-CM | POA: Diagnosis not present

## 2016-09-28 DIAGNOSIS — D899 Disorder involving the immune mechanism, unspecified: Secondary | ICD-10-CM | POA: Diagnosis not present

## 2016-09-28 DIAGNOSIS — Z79899 Other long term (current) drug therapy: Secondary | ICD-10-CM | POA: Diagnosis not present

## 2016-09-28 DIAGNOSIS — D649 Anemia, unspecified: Secondary | ICD-10-CM | POA: Diagnosis not present

## 2016-09-28 DIAGNOSIS — N185 Chronic kidney disease, stage 5: Secondary | ICD-10-CM | POA: Diagnosis not present

## 2016-09-28 DIAGNOSIS — I152 Hypertension secondary to endocrine disorders: Secondary | ICD-10-CM | POA: Diagnosis not present

## 2016-09-28 DIAGNOSIS — D631 Anemia in chronic kidney disease: Secondary | ICD-10-CM | POA: Diagnosis not present

## 2016-09-28 DIAGNOSIS — Z7952 Long term (current) use of systemic steroids: Secondary | ICD-10-CM | POA: Diagnosis not present

## 2016-09-28 DIAGNOSIS — E118 Type 2 diabetes mellitus with unspecified complications: Secondary | ICD-10-CM | POA: Diagnosis not present

## 2016-09-28 DIAGNOSIS — Z940 Kidney transplant status: Secondary | ICD-10-CM | POA: Diagnosis not present

## 2016-09-28 DIAGNOSIS — E119 Type 2 diabetes mellitus without complications: Secondary | ICD-10-CM | POA: Diagnosis not present

## 2016-09-28 DIAGNOSIS — I10 Essential (primary) hypertension: Secondary | ICD-10-CM | POA: Diagnosis not present

## 2016-09-28 DIAGNOSIS — E875 Hyperkalemia: Secondary | ICD-10-CM | POA: Diagnosis not present

## 2016-09-28 DIAGNOSIS — Z7984 Long term (current) use of oral hypoglycemic drugs: Secondary | ICD-10-CM | POA: Diagnosis not present

## 2016-09-28 DIAGNOSIS — D702 Other drug-induced agranulocytosis: Secondary | ICD-10-CM | POA: Diagnosis not present

## 2016-09-28 DIAGNOSIS — Z5181 Encounter for therapeutic drug level monitoring: Secondary | ICD-10-CM | POA: Diagnosis not present

## 2016-09-28 DIAGNOSIS — Z4822 Encounter for aftercare following kidney transplant: Secondary | ICD-10-CM | POA: Diagnosis not present

## 2016-09-30 ENCOUNTER — Encounter: Payer: Self-pay | Admitting: Physical Therapy

## 2016-10-01 ENCOUNTER — Telehealth (INDEPENDENT_AMBULATORY_CARE_PROVIDER_SITE_OTHER): Payer: Self-pay | Admitting: Radiology

## 2016-10-01 NOTE — Telephone Encounter (Signed)
I called and left voicemail advising that scat paperwork at front desk for pick up.

## 2016-10-04 DIAGNOSIS — D631 Anemia in chronic kidney disease: Secondary | ICD-10-CM | POA: Diagnosis not present

## 2016-10-04 DIAGNOSIS — Z940 Kidney transplant status: Secondary | ICD-10-CM | POA: Diagnosis not present

## 2016-10-04 DIAGNOSIS — I10 Essential (primary) hypertension: Secondary | ICD-10-CM | POA: Diagnosis not present

## 2016-10-04 DIAGNOSIS — Z4822 Encounter for aftercare following kidney transplant: Secondary | ICD-10-CM | POA: Diagnosis not present

## 2016-10-04 DIAGNOSIS — Z794 Long term (current) use of insulin: Secondary | ICD-10-CM | POA: Diagnosis not present

## 2016-10-04 DIAGNOSIS — E119 Type 2 diabetes mellitus without complications: Secondary | ICD-10-CM | POA: Diagnosis not present

## 2016-10-04 DIAGNOSIS — I120 Hypertensive chronic kidney disease with stage 5 chronic kidney disease or end stage renal disease: Secondary | ICD-10-CM | POA: Diagnosis not present

## 2016-10-04 DIAGNOSIS — Z79899 Other long term (current) drug therapy: Secondary | ICD-10-CM | POA: Diagnosis not present

## 2016-10-04 DIAGNOSIS — E1122 Type 2 diabetes mellitus with diabetic chronic kidney disease: Secondary | ICD-10-CM | POA: Diagnosis not present

## 2016-10-04 DIAGNOSIS — D702 Other drug-induced agranulocytosis: Secondary | ICD-10-CM | POA: Diagnosis not present

## 2016-10-04 DIAGNOSIS — D72819 Decreased white blood cell count, unspecified: Secondary | ICD-10-CM | POA: Diagnosis not present

## 2016-10-04 DIAGNOSIS — E872 Acidosis: Secondary | ICD-10-CM | POA: Diagnosis not present

## 2016-10-04 DIAGNOSIS — D899 Disorder involving the immune mechanism, unspecified: Secondary | ICD-10-CM | POA: Diagnosis not present

## 2016-10-04 DIAGNOSIS — N186 End stage renal disease: Secondary | ICD-10-CM | POA: Diagnosis not present

## 2016-10-04 DIAGNOSIS — T82868A Thrombosis of vascular prosthetic devices, implants and grafts, initial encounter: Secondary | ICD-10-CM | POA: Diagnosis not present

## 2016-10-04 DIAGNOSIS — T82898A Other specified complication of vascular prosthetic devices, implants and grafts, initial encounter: Secondary | ICD-10-CM | POA: Diagnosis not present

## 2016-10-04 DIAGNOSIS — E871 Hypo-osmolality and hyponatremia: Secondary | ICD-10-CM | POA: Diagnosis not present

## 2016-10-04 DIAGNOSIS — Z7952 Long term (current) use of systemic steroids: Secondary | ICD-10-CM | POA: Diagnosis not present

## 2016-10-05 ENCOUNTER — Ambulatory Visit: Payer: Medicare Other | Admitting: Physical Therapy

## 2016-10-12 ENCOUNTER — Encounter: Payer: Self-pay | Admitting: Physical Therapy

## 2016-10-12 ENCOUNTER — Ambulatory Visit: Payer: Medicare Other | Attending: Nephrology | Admitting: Physical Therapy

## 2016-10-12 DIAGNOSIS — Z940 Kidney transplant status: Secondary | ICD-10-CM | POA: Diagnosis not present

## 2016-10-12 DIAGNOSIS — R2689 Other abnormalities of gait and mobility: Secondary | ICD-10-CM | POA: Diagnosis not present

## 2016-10-12 DIAGNOSIS — Z89511 Acquired absence of right leg below knee: Secondary | ICD-10-CM | POA: Diagnosis not present

## 2016-10-12 DIAGNOSIS — Z79899 Other long term (current) drug therapy: Secondary | ICD-10-CM | POA: Diagnosis not present

## 2016-10-12 DIAGNOSIS — R2681 Unsteadiness on feet: Secondary | ICD-10-CM

## 2016-10-12 DIAGNOSIS — R296 Repeated falls: Secondary | ICD-10-CM | POA: Diagnosis not present

## 2016-10-12 DIAGNOSIS — T82868D Thrombosis of vascular prosthetic devices, implants and grafts, subsequent encounter: Secondary | ICD-10-CM | POA: Diagnosis not present

## 2016-10-12 DIAGNOSIS — D72819 Decreased white blood cell count, unspecified: Secondary | ICD-10-CM | POA: Diagnosis not present

## 2016-10-12 DIAGNOSIS — D8989 Other specified disorders involving the immune mechanism, not elsewhere classified: Secondary | ICD-10-CM | POA: Diagnosis not present

## 2016-10-12 DIAGNOSIS — M6281 Muscle weakness (generalized): Secondary | ICD-10-CM

## 2016-10-12 DIAGNOSIS — Z23 Encounter for immunization: Secondary | ICD-10-CM | POA: Diagnosis not present

## 2016-10-12 DIAGNOSIS — Z7984 Long term (current) use of oral hypoglycemic drugs: Secondary | ICD-10-CM | POA: Diagnosis not present

## 2016-10-12 DIAGNOSIS — M25651 Stiffness of right hip, not elsewhere classified: Secondary | ICD-10-CM | POA: Diagnosis not present

## 2016-10-12 DIAGNOSIS — Z7952 Long term (current) use of systemic steroids: Secondary | ICD-10-CM | POA: Diagnosis not present

## 2016-10-12 DIAGNOSIS — T82868A Thrombosis of vascular prosthetic devices, implants and grafts, initial encounter: Secondary | ICD-10-CM | POA: Insufficient documentation

## 2016-10-12 DIAGNOSIS — E872 Acidosis: Secondary | ICD-10-CM | POA: Diagnosis not present

## 2016-10-12 DIAGNOSIS — E119 Type 2 diabetes mellitus without complications: Secondary | ICD-10-CM | POA: Diagnosis not present

## 2016-10-12 DIAGNOSIS — Z4822 Encounter for aftercare following kidney transplant: Secondary | ICD-10-CM | POA: Diagnosis not present

## 2016-10-12 DIAGNOSIS — I10 Essential (primary) hypertension: Secondary | ICD-10-CM | POA: Diagnosis not present

## 2016-10-13 NOTE — Therapy (Signed)
[facility] [address] [place], [place], [address] Phone: [phone]   Fax:  [phone]  Physical Therapy Treatment  Patient Details  Name: Steven Logan MRN: [id number] Date of Birth: [birth year] Referring Provider: Mauricia Area, MD  Encounter Date: 10/12/2016      PT End of Session - 10/12/16 1906    Visit Number 15   Number of Visits 23   Date for PT Re-Evaluation 12/10/16   Authorization Type Medicare G-code & progress note every 10 visits   PT Start Time 1446   PT Stop Time 1530   PT Time Calculation (min) 44 min   Equipment Utilized During Treatment Gait belt   Activity Tolerance Patient tolerated treatment well   Behavior During Therapy [facility] for tasks assessed/performed      Past Medical History:  Diagnosis Date  . Abscess    great toe  . Anemia   . Arthritis   . Chronic kidney disease    esrd  . Depression   . Diabetes mellitus   . Dyslipidemia   . Eczema   . Gout   . HTN (hypertension)   . Morbid obesity ([place])   . Pancreatitis   . Pneumonia    2-3 years ago  . Pulmonary embolism ([place])    3  in  lungs  at  one time...  . Renal hypertension    Hemo started  Sept 2014- MWF  . Shortness of breath    ?? chest  cold he has now.  10/8- no SOB    Past Surgical History:  Procedure Laterality Date  . AMPUTATION Right 06/12/2013   Procedure: Transtibial Amputation;  Surgeon: Newt Minion, MD;  Location: [place];  Service: Orthopedics;  Laterality: Right;  . AMPUTATION Right 06/27/2013   Procedure: AMPUTATION ABOVE KNEE;  Surgeon: Newt Minion, MD;  Location: [facility];  Service: Orthopedics;  Laterality: Right;  Right Above Knee Amputation  . AV FISTULA PLACEMENT Left 04/07/2012   Procedure: ARTERIOVENOUS (AV) FISTULA CREATION;  Surgeon: Angelia Mould, MD;  Location: [place];  Service: Vascular;  Laterality: Left;  . FISTULOGRAM Left 08/07/2012   Procedure: FISTULOGRAM;  Surgeon:  Angelia Mould, MD;  Location: [facility] CATH LAB;  Service: Cardiovascular;  Laterality: Left;  arm  . I&D EXTREMITY Right 06/02/2013   Procedure: IRRIGATION AND DEBRIDEMENT ARTHROSCOPIC RIGHT ANKLE;  Surgeon: Augustin Schooling, MD;  Location: [facility];  Service: Orthopedics;  Laterality: Right;  . I&D EXTREMITY Right 06/04/2013   Procedure: IRRIGATION AND DEBRIDEMENT RIGHT FOOT/ANKLE;  Surgeon: Newt Minion, MD;  Location: [place];  Service: Orthopedics;  Laterality: Right;  . I&D EXTREMITY Right 06/06/2013   Procedure: IRRIGATION AND DEBRIDEMENT EXTREMITY;  Surgeon: Newt Minion, MD;  Location: [facility];  Service: Orthopedics;  Laterality: Right;  . I&D EXTREMITY Right 06/24/2013   Procedure: IRRIGATION AND DEBRIDEMENT and Revsion of TRANSTIBIAL AMPUTATION;  Surgeon: Newt Minion, MD;  Location: [place];  Service: Orthopedics;  Laterality: Right;  . INSERTION OF DIALYSIS CATHETER Right 09/19/2012   Procedure: INSERTION OF DIALYSIS CATHETER;  Surgeon: Angelia Mould, MD;  Location: [place];  Service: Vascular;  Laterality: Right;  . LIGATION OF COMPETING BRANCHES OF ARTERIOVENOUS FISTULA Left 09/19/2012   Procedure: LIGATION OF COMPETING BRANCHES OF LEFT ARM ARTERIOVENOUS FISTULA; Vein angioplasty;  Surgeon: Angelia Mould, MD;  Location: [facility];  Service: Vascular;  Laterality: Left;  . none      There were no  vitals filed for this visit.      Subjective Assessment - 10/12/16 1449    Subjective His left arm was painful & swollen. Doctor think his fistula is either infected or thrombosis.    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    Currently in Pain? Yes   Pain Score 5    Pain Location Other (Comment)  forearm   Pain Orientation Left   Pain Descriptors / Indicators Sore    Pain Type Acute pain   Pain Onset In the past 7 days   Pain Frequency Constant   Aggravating Factors  fistula problem    Pain Relieving Factors medications            [facility] PT Assessment - 10/12/16 1445      Ambulation/Gait   Ambulation/Gait Yes   Gait velocity 3.24 ft/sec, 3.93 ft/sec fast     Berg Balance Test   Sit to Stand Able to stand without using hands and stabilize independently   Standing Unsupported Able to stand safely 2 minutes   Sitting with Back Unsupported but Feet Supported on Floor or Stool Able to sit safely and securely 2 minutes   Stand to Sit Sits safely with minimal use of hands   Transfers Able to transfer safely, minor use of hands   Standing Unsupported with Eyes Closed Able to stand 10 seconds safely   Standing Ubsupported with Feet Together Able to place feet together independently and stand 1 minute safely   From Standing, Reach Forward with Outstretched Arm Can reach confidently >25 cm (10")   From Standing Position, Pick up Object from Floor Able to pick up shoe safely and easily   From Standing Position, Turn to Look Behind Over each Shoulder Looks behind from both sides and weight shifts well   Turn 360 Degrees Able to turn 360 degrees safely in 4 seconds or less   Standing Unsupported, Alternately Place Feet on Step/Stool Able to stand independently and safely and complete 8 steps in 20 seconds   Standing Unsupported, One Foot in Front Able to plae foot ahead of the other independently and hold 30 seconds   Standing on One Leg Able to lift leg independently and hold > 10 seconds   Total Score 55     Functional Gait  Assessment   Gait assessed  Yes   Gait Level Surface Walks 20 ft in less than 7 sec but greater than 5.5 sec, uses assistive device, slower speed, mild gait deviations, or deviates 6-10 in outside of the 12 in walkway width.   Change in Gait Speed Able to smoothly change walking speed without loss of balance or gait deviation.  Deviate no more than 6 in outside of the 12 in walkway width.   Gait with Horizontal Head Turns Performs head turns smoothly with no change in gait. Deviates no more than 6 in outside 12 in walkway width   Gait with Vertical Head Turns Performs head turns with no change in gait. Deviates no more than 6 in outside 12 in walkway width.   Gait and Pivot Turn Pivot turns safely within 3 sec and stops quickly with no loss of balance.   Step Over Obstacle Is able to step over one shoe box (4.5 in total height) without changing gait speed. No evidence of imbalance.  Gait with Narrow Base of Support Ambulates 4-7 steps.   Gait with Eyes Closed Walks 20 ft, uses assistive device, slower speed, mild gait deviations, deviates 6-10 in outside 12 in walkway width. Ambulates 20 ft in less than 9 sec but greater than 7 sec.   Ambulating Backwards Walks 20 ft, no assistive devices, good speed, no evidence for imbalance, normal gait   Steps Two feet to a stair, must use rail.   Total Score 23                     [facility] Adult PT Treatment/Exercise - 10/12/16 1445      Transfers   Stand to Sit 5: Supervision;With upper extremity assist;With armrests;To chair/3-in-1  to chairs with & without armrests   Stand to Sit Details (indicate cue type and reason) Visual cues for safe use of DME/AE;Verbal cues for sequencing;Verbal cues for technique;Verbal cues for safe use of DME/AE   Stand to Sit Details worked on engaging prosthetic knee     Ambulation/Gait   Ambulation/Gait Assistance 5: Supervision   Ambulation/Gait Assistance Details verbal & tactile cues on pelvic orientation, knee flexion in terminal stance & advancing prosthesis with pelvic motion.    Ambulation Distance (Feet) 500 Feet   Assistive device Prosthesis;None   Gait Pattern Step-through pattern;Decreased arm swing - right;Decreased step length - left;Decreased stance time - right;Decreased stride length;Decreased hip/knee flexion -  right;Decreased weight shift to right;Right hip hike;Abducted- right   Ambulation Surface Indoor;Level   Stairs Yes   Stairs Assistance 5: Supervision   Stairs Assistance Details (indicate cue type and reason) verbal cues on prosthetic knee engagement descending alt. pattern   Stair Management Technique Two rails;Alternating pattern;Forwards   Number of Stairs 4  5 reps   Ramp 5: Supervision  prosthesis only   Ramp Details (indicate cue type and reason) Minimal knee flexion. Verbal cues how to increase.    Curb 5: Supervision  prosthesis only     Therapeutic Activites    Lifting Blocked practice with engaging prosthesis. Weight shifts away from prosthesis with balance losses.      Prosthetics   Prosthetic Care Comments  Patient using TES belt as PT recommended until he gets new socket. PT instructed in wearing tank top under belt so lower body garments over belt to aid with toileting.    Current prosthetic wear tolerance (days/week)  7 days /wk   Current prosthetic wear tolerance (#hours/day)  all awake hours   Residual limb condition  limb is intact;    Education Provided Other (comment)  see prosthetic care comment   Person(s) Educated Patient   Education Method Explanation;Verbal cues   Education Method Verbalized understanding;Verbal cues required;Needs further instruction                  PT Short Term Goals - 10/12/16 1924      PT SHORT TERM GOAL #1   Title Patient descends stairs with 2 rails with reciprocal pattern with control. (All STGs Target Date: 11/11/2016)   Time 1   Period Months   Status On-going   Target Date 11/11/16     PT SHORT TERM GOAL #2   Title Patient ambulates on grass with head turns to scan environment maintaining path & pace with supervision    Time 1   Period Months   Status On-going   Target Date 11/11/16     PT SHORT TERM GOAL #3   Title Patient ambulates at fast  pace > 4.3 ft/sec.     Time 1   Period Months   Status  On-going   Target Date 11/11/16           PT Long Term Goals - 10/12/16 1819      PT LONG TERM GOAL #1   Title Patient ambulates at comfortable gait velocity >4.0 ft/sec and increases to fast pace >5.0 ft/sec safely. (All LTG Target Date: 12/10/2016)   Baseline 10/12/16 not met  comfortable 3.24 ft/sec, fast 3.93 ft/sec at fast pace.   Time 2   Period Months   Status On-going   Target Date 12/10/16     PT LONG TERM GOAL #2   Title Patient negotiates ramps & curbs with prosthesis only and descend stairs reciprocally with 1 rail modified independent.      Baseline 10/12/16 neg ramps & curbs with prosthesis only with supervision and descends stairs reciprocally with 2 rails and cues.    Time 2   Period Months   Status On-going   Target Date 12/10/16     PT LONG TERM GOAL #3   Title Berg Balance >54/56 to decrease fall risk  (Target Date: 08/15/2016)   Baseline MET 10/12/16 Berg Balance 55/56   Status Achieved     PT LONG TERM GOAL #4   Title Functional Gait Assessment >25/30 to decrease fall risk.    Baseline 10/12/16 FGA 23/30   Time 2   Status On-going   Target Date 12/10/16     PT LONG TERM GOAL #5   Title Patient lifts & carries 25# 50', carries grocery bag on stairs with 1 rail and performs work simulation tasks safely, independently.    Baseline 10/12/16 Patient lifts 20# with poor technique & balance losses. Pt fell at work with lifting ~1 month ago.    Time 2   Period Months   Status On-going   Target Date 12/10/16               Plan - 10/12/16 1926    Clinical Impression Statement Patient only able to attend 1 session during this certification period due to medical issues with appts at [facility] with kidney transplant. He has potential to meet LTGs with further PT and thinks he has issues worked out for attendance.    Rehab Potential Good   PT Frequency 1x / week   PT Duration 8 weeks   PT Treatment/Interventions ADLs/Self Care Home Management;Gait  training;Stair training;Functional mobility training;Therapeutic activities;Therapeutic exercise;Balance training;Neuromuscular re-education;Patient/family education;Prosthetic Training   PT Next Visit Plan work towards STGs including lifting, prosthetic gait with head turns, stairs   Consulted and Agree with Plan of Care Patient      Patient will benefit from skilled therapeutic intervention in order to improve the following deficits and impairments:  Abnormal gait, Decreased activity tolerance, Decreased balance, Decreased mobility, Decreased safety awareness, Postural dysfunction, Prosthetic Dependency, Decreased range of motion  Visit Diagnosis: Other abnormalities of gait and mobility  Unsteadiness on feet  Muscle weakness (generalized)  Repeated falls  Stiffness of right hip, not elsewhere classified     Problem List Patient Active Problem List   Diagnosis Date Noted  . History of right above knee amputation ([place]) 07/12/2016  . Acute blood loss anemia 06/25/2013  . Hyperglycemia 06/07/2013  . Arm DVT (deep venous thromboembolism), acute ([place]) 06/07/2013  . ESRD on dialysis ([place]) 06/05/2013  . Encephalopathy, toxic 06/03/2013  . Cellulitis and abscess of leg 06/03/2013  . Anemia in chronic kidney  disease 06/03/2013  . Arm edema 06/03/2013  . Leg abscess 06/03/2013  . Severe sepsis ([place]) 06/03/2013  . Septic shock ([place]) 06/03/2013  . Septic arthritis of ankle or foot, right 06/03/2013  . Abscess of right lower leg 06/02/2013  . Wrist lump 07/05/2012  . Swelling of limb-Left index and middle fingers 07/05/2012  . Aftercare following surgery of the circulatory system, [place] 07/05/2012  . Tingling-left wrist and some pain. 07/05/2012  . Pulmonary edema 05/04/2012  . Acute respiratory failure ([place]) 05/04/2012  . CKD (chronic kidney disease) stage 5, GFR less than 15 ml/min ([facility]) 05/04/2012  . Hyperkalemia 05/04/2012  . Pre-operative cardiovascular examination 03/15/2012   . Iron deficiency anemia 09/22/2010  . HTN (hypertension) 09/22/2010  . Hyperlipidemia 09/22/2010  . Gout 09/22/2010  . CRI (chronic renal insufficiency) 09/22/2010  . Pulmonary embolism, bilateral ([place]) 09/22/2010    Kden Wagster PT, DPT 10/13/2016, 12:30 PM  [facility] [address] [place], [place], [address] Phone: [phone]   Fax:  [phone]  Name: Steven Logan MRN: [id number] Date of Birth: 07-Dec-1970

## 2016-10-20 DIAGNOSIS — Z79899 Other long term (current) drug therapy: Secondary | ICD-10-CM | POA: Diagnosis not present

## 2016-10-20 DIAGNOSIS — Z5181 Encounter for therapeutic drug level monitoring: Secondary | ICD-10-CM | POA: Diagnosis not present

## 2016-10-20 DIAGNOSIS — Z940 Kidney transplant status: Secondary | ICD-10-CM | POA: Diagnosis not present

## 2016-10-21 ENCOUNTER — Ambulatory Visit: Payer: Medicare Other

## 2016-10-26 ENCOUNTER — Ambulatory Visit: Payer: Medicare Other | Admitting: Physical Therapy

## 2016-10-26 DIAGNOSIS — N186 End stage renal disease: Secondary | ICD-10-CM | POA: Diagnosis not present

## 2016-10-26 DIAGNOSIS — D631 Anemia in chronic kidney disease: Secondary | ICD-10-CM | POA: Diagnosis not present

## 2016-10-26 DIAGNOSIS — Z79899 Other long term (current) drug therapy: Secondary | ICD-10-CM | POA: Diagnosis not present

## 2016-10-26 DIAGNOSIS — E119 Type 2 diabetes mellitus without complications: Secondary | ICD-10-CM | POA: Diagnosis not present

## 2016-10-26 DIAGNOSIS — D899 Disorder involving the immune mechanism, unspecified: Secondary | ICD-10-CM | POA: Diagnosis not present

## 2016-10-26 DIAGNOSIS — E872 Acidosis: Secondary | ICD-10-CM | POA: Diagnosis not present

## 2016-10-26 DIAGNOSIS — Z940 Kidney transplant status: Secondary | ICD-10-CM | POA: Diagnosis not present

## 2016-10-26 DIAGNOSIS — Z992 Dependence on renal dialysis: Secondary | ICD-10-CM | POA: Diagnosis not present

## 2016-10-26 DIAGNOSIS — I10 Essential (primary) hypertension: Secondary | ICD-10-CM | POA: Diagnosis not present

## 2016-10-26 DIAGNOSIS — D649 Anemia, unspecified: Secondary | ICD-10-CM | POA: Diagnosis not present

## 2016-10-26 DIAGNOSIS — I120 Hypertensive chronic kidney disease with stage 5 chronic kidney disease or end stage renal disease: Secondary | ICD-10-CM | POA: Diagnosis not present

## 2016-10-26 DIAGNOSIS — D8989 Other specified disorders involving the immune mechanism, not elsewhere classified: Secondary | ICD-10-CM | POA: Diagnosis not present

## 2016-10-26 DIAGNOSIS — T82868D Thrombosis of vascular prosthetic devices, implants and grafts, subsequent encounter: Secondary | ICD-10-CM | POA: Diagnosis not present

## 2016-10-26 DIAGNOSIS — Z7982 Long term (current) use of aspirin: Secondary | ICD-10-CM | POA: Diagnosis not present

## 2016-10-26 DIAGNOSIS — E1122 Type 2 diabetes mellitus with diabetic chronic kidney disease: Secondary | ICD-10-CM | POA: Diagnosis not present

## 2016-10-26 DIAGNOSIS — Z794 Long term (current) use of insulin: Secondary | ICD-10-CM | POA: Diagnosis not present

## 2016-10-26 DIAGNOSIS — T82868A Thrombosis of vascular prosthetic devices, implants and grafts, initial encounter: Secondary | ICD-10-CM | POA: Diagnosis not present

## 2016-10-26 DIAGNOSIS — Z4822 Encounter for aftercare following kidney transplant: Secondary | ICD-10-CM | POA: Diagnosis not present

## 2016-10-28 ENCOUNTER — Telehealth (INDEPENDENT_AMBULATORY_CARE_PROVIDER_SITE_OTHER): Payer: Self-pay | Admitting: Orthopedic Surgery

## 2016-10-28 ENCOUNTER — Encounter: Payer: Self-pay | Admitting: Physical Therapy

## 2016-10-28 ENCOUNTER — Ambulatory Visit: Payer: Medicare Other | Attending: Nephrology | Admitting: Physical Therapy

## 2016-10-28 DIAGNOSIS — R2681 Unsteadiness on feet: Secondary | ICD-10-CM | POA: Diagnosis not present

## 2016-10-28 DIAGNOSIS — R2689 Other abnormalities of gait and mobility: Secondary | ICD-10-CM | POA: Diagnosis not present

## 2016-10-28 DIAGNOSIS — M6281 Muscle weakness (generalized): Secondary | ICD-10-CM | POA: Insufficient documentation

## 2016-10-28 NOTE — Telephone Encounter (Signed)
07/12/2016 OV NOTE FAXED TO [facility] [phone]

## 2016-10-29 ENCOUNTER — Ambulatory Visit: Payer: Medicare Other | Admitting: Physical Therapy

## 2016-10-29 NOTE — Patient Instructions (Signed)
6 activities high frequency (>100reps is goal) daily:  1. Sit to/from stand engaging prosthesis   2. pick up & place items on floor using sit to/from stand technique to engage prosthesis.   3. Stairs with 2 rails then single rail with step-to using hydraulics. Advance to alternating with 2 rails only and no higher than 5th step from bottom.  4. Walk negotiating around cones or obstacles ~4-5 ft apart / [place]. Use "headlights" to orient pelvis & prosthesis. Keep prosthesis in /not kicked out to side and bend the knee.  5. Walk scanning the environment trying to keep straight path and not slow down. Look right/left, up/down, and diagonals 6. Changing speed: start comfortable for 2-3 steps, then walk fast (quicker & LONGER steps) for 5-10 steps on prosthesis, then comfortable 5-10 steps, etc.

## 2016-10-29 NOTE — Therapy (Signed)
[facility] [address] [place], [place], [address] Phone: [phone]   Fax:  [phone]  Physical Therapy Treatment  Patient Details  Name: Steven Logan MRN: [id number] Date of Birth: [birth year] Referring Provider: Mauricia Area, MD  Encounter Date: 10/28/2016      PT End of Session - 10/28/16 0845    Visit Number 16   Number of Visits 23   Date for PT Re-Evaluation 12/10/16   Authorization Type Medicare G-code & progress note every 10 visits   PT Start Time 0807   PT Stop Time 0845   PT Time Calculation (min) 38 min   Equipment Utilized During Treatment Gait belt   Activity Tolerance Patient tolerated treatment well   Behavior During Therapy [facility] for tasks assessed/performed      Past Medical History:  Diagnosis Date  . Abscess    great toe  . Anemia   . Arthritis   . Chronic kidney disease    esrd  . Depression   . Diabetes mellitus   . Dyslipidemia   . Eczema   . Gout   . HTN (hypertension)   . Morbid obesity ([place])   . Pancreatitis   . Pneumonia    2-3 years ago  . Pulmonary embolism ([place])    3  in  lungs  at  one time...  . Renal hypertension    Hemo started  Sept 2014- MWF  . Shortness of breath    ?? chest  cold he has now.  10/8- no SOB    Past Surgical History:  Procedure Laterality Date  . AMPUTATION Right 06/12/2013   Procedure: Transtibial Amputation;  Surgeon: Newt Minion, MD;  Location: [facility];  Service: Orthopedics;  Laterality: Right;  . AMPUTATION Right 06/27/2013   Procedure: AMPUTATION ABOVE KNEE;  Surgeon: Newt Minion, MD;  Location: [place];  Service: Orthopedics;  Laterality: Right;  Right Above Knee Amputation  . AV FISTULA PLACEMENT Left 04/07/2012   Procedure: ARTERIOVENOUS (AV) FISTULA CREATION;  Surgeon: Angelia Mould, MD;  Location: [place];  Service: Vascular;  Laterality: Left;  . FISTULOGRAM Left 08/07/2012   Procedure: FISTULOGRAM;  Surgeon:  Angelia Mould, MD;  Location: [facility] CATH LAB;  Service: Cardiovascular;  Laterality: Left;  arm  . I&D EXTREMITY Right 06/02/2013   Procedure: IRRIGATION AND DEBRIDEMENT ARTHROSCOPIC RIGHT ANKLE;  Surgeon: Augustin Schooling, MD;  Location: [place];  Service: Orthopedics;  Laterality: Right;  . I&D EXTREMITY Right 06/04/2013   Procedure: IRRIGATION AND DEBRIDEMENT RIGHT FOOT/ANKLE;  Surgeon: Newt Minion, MD;  Location: [place];  Service: Orthopedics;  Laterality: Right;  . I&D EXTREMITY Right 06/06/2013   Procedure: IRRIGATION AND DEBRIDEMENT EXTREMITY;  Surgeon: Newt Minion, MD;  Location: [facility];  Service: Orthopedics;  Laterality: Right;  . I&D EXTREMITY Right 06/24/2013   Procedure: IRRIGATION AND DEBRIDEMENT and Revsion of TRANSTIBIAL AMPUTATION;  Surgeon: Newt Minion, MD;  Location: [facility];  Service: Orthopedics;  Laterality: Right;  . INSERTION OF DIALYSIS CATHETER Right 09/19/2012   Procedure: INSERTION OF DIALYSIS CATHETER;  Surgeon: Angelia Mould, MD;  Location: [place];  Service: Vascular;  Laterality: Right;  . LIGATION OF COMPETING BRANCHES OF ARTERIOVENOUS FISTULA Left 09/19/2012   Procedure: LIGATION OF COMPETING BRANCHES OF LEFT ARM ARTERIOVENOUS FISTULA; Vein angioplasty;  Surgeon: Angelia Mould, MD;  Location: [facility];  Service: Vascular;  Laterality: Left;  . none      There were no  vitals filed for this visit.      Subjective Assessment - 10/28/16 0819    Subjective He is working with prosthetist on socket revision. His next check socket is 2 weeks out so probably will not get new socket until ~month away.    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    Currently in Pain? No/denies     Prosthetic Training with microprocessor Transfemoral prosthesis: 1. Sit to/from stand engaging  prosthesis . PT verbal & tactile cues.  2. pick up & place items on floor using sit to/from stand technique to engage prosthesis.  PT verbal & tactile cues.  3. Stairs with 2 rails then single rail with step-to using hydraulics. Advance to alternating with 2 rails only and no higher than 5th step from bottom. PT demo, verbal & tactile cues.  4. Walk negotiating around cones or obstacles ~4-5 ft apart / slalom. Use "headlights" to orient pelvis & prosthesis. Keep prosthesis in /not kicked out to side and bend the knee.  Manual cues at pelvis, verbal cues.  5. Walk scanning the environment trying to keep straight path and not slow down. Look right/left, up/down, and diagonals. Tactile cues at pelvis & verbal cues.  6. Changing speed: Started on Treadmill comfortable pace 2.0-2.2 mph  Fast pace 2.636mh progressing 0.163m up to 2.36m35mfor 20sec bouts. Then overground in hall, start comfortable for 2-3 steps, then walk fast (quicker & LONGER steps) for 5-10 steps on prosthesis, then comfortable 5-10 steps, etc.                            PT Education - 10/28/16 0845    Education provided Yes   Education Details see 6 activites in pt instructions   Person(s) Educated Patient   Methods Explanation;Demonstration;Tactile cues;Verbal cues   Comprehension Verbalized understanding;Returned demonstration;Verbal cues required;Tactile cues required;Need further instruction          PT Short Term Goals - 10/12/16 1924      PT SHORT TERM GOAL #1   Title Patient descends stairs with 2 rails with reciprocal pattern with control. (All STGs Target Date: 11/11/2016)   Time 1   Period Months   Status On-going   Target Date 11/11/16     PT SHORT TERM GOAL #2   Title Patient ambulates on grass with head turns to scan environment maintaining path & pace with supervision    Time 1   Period Months   Status On-going   Target Date 11/11/16     PT SHORT TERM GOAL #3   Title Patient  ambulates at fast pace > 4.3 ft/sec.     Time 1   Period Months   Status On-going   Target Date 11/11/16           PT Long Term Goals - 10/12/16 1819      PT LONG TERM GOAL #1   Title Patient ambulates at comfortable gait velocity >4.0 ft/sec and increases to fast pace >5.0 ft/sec safely. (All LTG Target Date: 12/10/2016)   Baseline 10/12/16 not met  comfortable 3.24 ft/sec, fast 3.93 ft/sec at fast pace.   Time 2   Period Months   Status On-going   Target Date 12/10/16     PT LONG TERM GOAL #2   Title Patient negotiates  ramps & curbs with prosthesis only and descend stairs reciprocally with 1 rail modified independent.      Baseline 10/12/16 neg ramps & curbs with prosthesis only with supervision and descends stairs reciprocally with 2 rails and cues.    Time 2   Period Months   Status On-going   Target Date 12/10/16     PT LONG TERM GOAL #3   Title Berg Balance >54/56 to decrease fall risk  (Target Date: 08/15/2016)   Baseline MET 10/12/16 Berg Balance 55/56   Status Achieved     PT LONG TERM GOAL #4   Title Functional Gait Assessment >25/30 to decrease fall risk.    Baseline 10/12/16 FGA 23/30   Time 2   Status On-going   Target Date 12/10/16     PT LONG TERM GOAL #5   Title Patient lifts & carries 25# 50', carries grocery bag on stairs with 1 rail and performs work simulation tasks safely, independently.    Baseline 10/12/16 Patient lifts 20# with poor technique & balance losses. Pt fell at work with lifting ~1 month ago.    Time 2   Period Months   Status On-going   Target Date 12/10/16               Plan - 10/28/16 1815    Clinical Impression Statement Patient improved his ability to engage the hydraulics of prosthesis with sit to/from stand, picking up items & stairs with skilled instruction,  He improved prosthetic control with negotiating around obstacles, scanning & changing speed with skilled instructions. He needs additional instruction to carryover  into daily routine.    Rehab Potential Good   PT Frequency 1x / week   PT Duration 8 weeks   PT Treatment/Interventions ADLs/Self Care Home Management;Gait training;Stair training;Functional mobility training;Therapeutic activities;Therapeutic exercise;Balance training;Neuromuscular re-education;Patient/family education;Prosthetic Training   PT Next Visit Plan work towards [place], review activities sit/stand, lifting, stairs, negotiating, scanning & changing speed   Consulted and Agree with Plan of Care Patient      Patient will benefit from skilled therapeutic intervention in order to improve the following deficits and impairments:  Abnormal gait, Decreased activity tolerance, Decreased balance, Decreased mobility, Decreased safety awareness, Postural dysfunction, Prosthetic Dependency, Decreased range of motion  Visit Diagnosis: Other abnormalities of gait and mobility  Unsteadiness on feet  Muscle weakness (generalized)     Problem List Patient Active Problem List   Diagnosis Date Noted  . History of right above knee amputation ([place]) 07/12/2016  . Acute blood loss anemia 06/25/2013  . Hyperglycemia 06/07/2013  . Arm DVT (deep venous thromboembolism), acute ([place]) 06/07/2013  . ESRD on dialysis ([place]) 06/05/2013  . Encephalopathy, toxic 06/03/2013  . Cellulitis and abscess of leg 06/03/2013  . Anemia in chronic kidney disease 06/03/2013  . Arm edema 06/03/2013  . Leg abscess 06/03/2013  . Severe sepsis ([place]) 06/03/2013  . Septic shock ([place]) 06/03/2013  . Septic arthritis of ankle or foot, right 06/03/2013  . Abscess of right lower leg 06/02/2013  . Wrist lump 07/05/2012  . Swelling of limb-Left index and middle fingers 07/05/2012  . Aftercare following surgery of the circulatory system, [place] 07/05/2012  . Tingling-left wrist and some pain. 07/05/2012  . Pulmonary edema 05/04/2012  . Acute respiratory failure ([place]) 05/04/2012  . CKD (chronic kidney disease) stage 5, GFR less  than 15 ml/min (HCC) 05/04/2012  . Hyperkalemia 05/04/2012  . Pre-operative cardiovascular examination 03/15/2012  . Iron deficiency anemia 09/22/2010  . HTN (hypertension)  09/22/2010  . Hyperlipidemia 09/22/2010  . Gout 09/22/2010  . CRI (chronic renal insufficiency) 09/22/2010  . Pulmonary embolism, bilateral ([place]) 09/22/2010    Asal Teas PT, DPT 10/29/2016, 8:20 AM  [facility] [address] [place], [place], [address] Phone: [phone]   Fax:  [phone]  Name: FRANCHOT POLLITT MRN: [id number] Date of Birth: 11-Jun-1970

## 2016-11-02 ENCOUNTER — Ambulatory Visit: Payer: Medicare Other | Admitting: Physical Therapy

## 2016-11-05 ENCOUNTER — Ambulatory Visit: Payer: Medicare Other | Admitting: Physical Therapy

## 2016-11-08 ENCOUNTER — Ambulatory Visit: Payer: Medicare Other | Admitting: Physical Therapy

## 2016-11-09 DIAGNOSIS — Z5181 Encounter for therapeutic drug level monitoring: Secondary | ICD-10-CM | POA: Diagnosis not present

## 2016-11-09 DIAGNOSIS — Z79899 Other long term (current) drug therapy: Secondary | ICD-10-CM | POA: Diagnosis not present

## 2016-11-09 DIAGNOSIS — Z940 Kidney transplant status: Secondary | ICD-10-CM | POA: Diagnosis not present

## 2016-11-15 ENCOUNTER — Ambulatory Visit: Payer: Medicare Other | Admitting: Physical Therapy

## 2016-11-22 ENCOUNTER — Ambulatory Visit: Payer: Medicare Other | Admitting: Physical Therapy

## 2016-11-29 ENCOUNTER — Encounter: Payer: BLUE CROSS/BLUE SHIELD | Admitting: Physical Therapy

## 2016-11-29 DIAGNOSIS — D8989 Other specified disorders involving the immune mechanism, not elsewhere classified: Secondary | ICD-10-CM | POA: Diagnosis not present

## 2016-11-29 DIAGNOSIS — D649 Anemia, unspecified: Secondary | ICD-10-CM | POA: Diagnosis not present

## 2016-11-29 DIAGNOSIS — Z794 Long term (current) use of insulin: Secondary | ICD-10-CM | POA: Diagnosis not present

## 2016-11-29 DIAGNOSIS — B259 Cytomegaloviral disease, unspecified: Secondary | ICD-10-CM | POA: Insufficient documentation

## 2016-11-29 DIAGNOSIS — Z7952 Long term (current) use of systemic steroids: Secondary | ICD-10-CM | POA: Diagnosis not present

## 2016-11-29 DIAGNOSIS — E119 Type 2 diabetes mellitus without complications: Secondary | ICD-10-CM | POA: Diagnosis not present

## 2016-11-29 DIAGNOSIS — Z940 Kidney transplant status: Secondary | ICD-10-CM | POA: Diagnosis not present

## 2016-11-29 DIAGNOSIS — E872 Acidosis: Secondary | ICD-10-CM | POA: Diagnosis not present

## 2016-11-29 DIAGNOSIS — Z792 Long term (current) use of antibiotics: Secondary | ICD-10-CM | POA: Diagnosis not present

## 2016-11-29 DIAGNOSIS — I770 Arteriovenous fistula, acquired: Secondary | ICD-10-CM | POA: Diagnosis not present

## 2016-11-29 DIAGNOSIS — D72819 Decreased white blood cell count, unspecified: Secondary | ICD-10-CM | POA: Diagnosis not present

## 2016-11-29 DIAGNOSIS — I10 Essential (primary) hypertension: Secondary | ICD-10-CM | POA: Diagnosis not present

## 2016-11-29 DIAGNOSIS — Z79899 Other long term (current) drug therapy: Secondary | ICD-10-CM | POA: Diagnosis not present

## 2016-11-29 DIAGNOSIS — Z5181 Encounter for therapeutic drug level monitoring: Secondary | ICD-10-CM | POA: Diagnosis not present

## 2016-12-03 DIAGNOSIS — Z5181 Encounter for therapeutic drug level monitoring: Secondary | ICD-10-CM | POA: Diagnosis not present

## 2016-12-03 DIAGNOSIS — Z79899 Other long term (current) drug therapy: Secondary | ICD-10-CM | POA: Diagnosis not present

## 2016-12-03 DIAGNOSIS — Z940 Kidney transplant status: Secondary | ICD-10-CM | POA: Diagnosis not present

## 2016-12-03 DIAGNOSIS — D899 Disorder involving the immune mechanism, unspecified: Secondary | ICD-10-CM | POA: Diagnosis not present

## 2016-12-06 ENCOUNTER — Encounter: Payer: Medicare Other | Admitting: Physical Therapy

## 2016-12-14 DIAGNOSIS — Z940 Kidney transplant status: Secondary | ICD-10-CM | POA: Diagnosis not present

## 2016-12-21 DIAGNOSIS — Z5181 Encounter for therapeutic drug level monitoring: Secondary | ICD-10-CM | POA: Diagnosis not present

## 2016-12-21 DIAGNOSIS — D702 Other drug-induced agranulocytosis: Secondary | ICD-10-CM | POA: Diagnosis not present

## 2016-12-21 DIAGNOSIS — Z940 Kidney transplant status: Secondary | ICD-10-CM | POA: Diagnosis not present

## 2016-12-21 DIAGNOSIS — E119 Type 2 diabetes mellitus without complications: Secondary | ICD-10-CM | POA: Diagnosis not present

## 2016-12-21 DIAGNOSIS — I10 Essential (primary) hypertension: Secondary | ICD-10-CM | POA: Diagnosis not present

## 2016-12-21 DIAGNOSIS — Z7952 Long term (current) use of systemic steroids: Secondary | ICD-10-CM | POA: Diagnosis not present

## 2016-12-21 DIAGNOSIS — Z79899 Other long term (current) drug therapy: Secondary | ICD-10-CM | POA: Diagnosis not present

## 2016-12-21 DIAGNOSIS — Z792 Long term (current) use of antibiotics: Secondary | ICD-10-CM | POA: Diagnosis not present

## 2016-12-21 DIAGNOSIS — E872 Acidosis: Secondary | ICD-10-CM | POA: Diagnosis not present

## 2016-12-21 DIAGNOSIS — B259 Cytomegaloviral disease, unspecified: Secondary | ICD-10-CM | POA: Diagnosis not present

## 2016-12-21 DIAGNOSIS — Z7984 Long term (current) use of oral hypoglycemic drugs: Secondary | ICD-10-CM | POA: Diagnosis not present

## 2016-12-27 DIAGNOSIS — B258 Other cytomegaloviral diseases: Secondary | ICD-10-CM | POA: Diagnosis not present

## 2016-12-27 DIAGNOSIS — I10 Essential (primary) hypertension: Secondary | ICD-10-CM | POA: Diagnosis not present

## 2016-12-27 DIAGNOSIS — Z7952 Long term (current) use of systemic steroids: Secondary | ICD-10-CM | POA: Diagnosis not present

## 2016-12-27 DIAGNOSIS — D72818 Other decreased white blood cell count: Secondary | ICD-10-CM | POA: Diagnosis not present

## 2016-12-27 DIAGNOSIS — Z7902 Long term (current) use of antithrombotics/antiplatelets: Secondary | ICD-10-CM | POA: Diagnosis not present

## 2016-12-27 DIAGNOSIS — Z89511 Acquired absence of right leg below knee: Secondary | ICD-10-CM | POA: Diagnosis not present

## 2016-12-27 DIAGNOSIS — Z79899 Other long term (current) drug therapy: Secondary | ICD-10-CM | POA: Diagnosis not present

## 2016-12-27 DIAGNOSIS — D8989 Other specified disorders involving the immune mechanism, not elsewhere classified: Secondary | ICD-10-CM | POA: Diagnosis not present

## 2016-12-27 DIAGNOSIS — Z940 Kidney transplant status: Secondary | ICD-10-CM | POA: Diagnosis not present

## 2016-12-27 DIAGNOSIS — Z7984 Long term (current) use of oral hypoglycemic drugs: Secondary | ICD-10-CM | POA: Diagnosis not present

## 2016-12-27 DIAGNOSIS — Z7982 Long term (current) use of aspirin: Secondary | ICD-10-CM | POA: Diagnosis not present

## 2016-12-27 DIAGNOSIS — E119 Type 2 diabetes mellitus without complications: Secondary | ICD-10-CM | POA: Diagnosis not present

## 2016-12-27 DIAGNOSIS — Z794 Long term (current) use of insulin: Secondary | ICD-10-CM | POA: Diagnosis not present

## 2016-12-27 DIAGNOSIS — Z4822 Encounter for aftercare following kidney transplant: Secondary | ICD-10-CM | POA: Diagnosis not present

## 2016-12-27 DIAGNOSIS — T82868D Thrombosis of vascular prosthetic devices, implants and grafts, subsequent encounter: Secondary | ICD-10-CM | POA: Diagnosis not present

## 2016-12-27 DIAGNOSIS — E872 Acidosis: Secondary | ICD-10-CM | POA: Diagnosis not present

## 2016-12-27 DIAGNOSIS — D649 Anemia, unspecified: Secondary | ICD-10-CM | POA: Diagnosis not present

## 2016-12-27 DIAGNOSIS — B259 Cytomegaloviral disease, unspecified: Secondary | ICD-10-CM | POA: Diagnosis not present

## 2017-01-06 DIAGNOSIS — Z792 Long term (current) use of antibiotics: Secondary | ICD-10-CM | POA: Diagnosis not present

## 2017-01-06 DIAGNOSIS — B259 Cytomegaloviral disease, unspecified: Secondary | ICD-10-CM | POA: Diagnosis not present

## 2017-01-06 DIAGNOSIS — Z940 Kidney transplant status: Secondary | ICD-10-CM | POA: Diagnosis not present

## 2017-01-06 DIAGNOSIS — E119 Type 2 diabetes mellitus without complications: Secondary | ICD-10-CM | POA: Diagnosis not present

## 2017-01-06 DIAGNOSIS — I10 Essential (primary) hypertension: Secondary | ICD-10-CM | POA: Diagnosis not present

## 2017-01-06 DIAGNOSIS — Z7952 Long term (current) use of systemic steroids: Secondary | ICD-10-CM | POA: Diagnosis not present

## 2017-01-06 DIAGNOSIS — Z79899 Other long term (current) drug therapy: Secondary | ICD-10-CM | POA: Diagnosis not present

## 2017-01-06 DIAGNOSIS — Z5181 Encounter for therapeutic drug level monitoring: Secondary | ICD-10-CM | POA: Diagnosis not present

## 2017-01-06 DIAGNOSIS — Z794 Long term (current) use of insulin: Secondary | ICD-10-CM | POA: Diagnosis not present

## 2017-01-13 DIAGNOSIS — Z940 Kidney transplant status: Secondary | ICD-10-CM | POA: Diagnosis not present

## 2017-01-24 DIAGNOSIS — Z4822 Encounter for aftercare following kidney transplant: Secondary | ICD-10-CM | POA: Diagnosis not present

## 2017-01-24 DIAGNOSIS — Z940 Kidney transplant status: Secondary | ICD-10-CM | POA: Diagnosis not present

## 2017-02-07 DIAGNOSIS — E872 Acidosis: Secondary | ICD-10-CM | POA: Diagnosis not present

## 2017-02-07 DIAGNOSIS — Z79899 Other long term (current) drug therapy: Secondary | ICD-10-CM | POA: Diagnosis not present

## 2017-02-07 DIAGNOSIS — D72819 Decreased white blood cell count, unspecified: Secondary | ICD-10-CM | POA: Diagnosis not present

## 2017-02-07 DIAGNOSIS — Z794 Long term (current) use of insulin: Secondary | ICD-10-CM | POA: Diagnosis not present

## 2017-02-07 DIAGNOSIS — Z7984 Long term (current) use of oral hypoglycemic drugs: Secondary | ICD-10-CM | POA: Diagnosis not present

## 2017-02-07 DIAGNOSIS — Z4822 Encounter for aftercare following kidney transplant: Secondary | ICD-10-CM | POA: Diagnosis not present

## 2017-02-07 DIAGNOSIS — Z7952 Long term (current) use of systemic steroids: Secondary | ICD-10-CM | POA: Diagnosis not present

## 2017-02-07 DIAGNOSIS — Z940 Kidney transplant status: Secondary | ICD-10-CM | POA: Diagnosis not present

## 2017-02-07 DIAGNOSIS — B338 Other specified viral diseases: Secondary | ICD-10-CM | POA: Diagnosis not present

## 2017-02-07 DIAGNOSIS — T82868D Thrombosis of vascular prosthetic devices, implants and grafts, subsequent encounter: Secondary | ICD-10-CM | POA: Diagnosis not present

## 2017-02-07 DIAGNOSIS — I10 Essential (primary) hypertension: Secondary | ICD-10-CM | POA: Diagnosis not present

## 2017-02-07 DIAGNOSIS — I770 Arteriovenous fistula, acquired: Secondary | ICD-10-CM | POA: Diagnosis not present

## 2017-02-07 DIAGNOSIS — E119 Type 2 diabetes mellitus without complications: Secondary | ICD-10-CM | POA: Diagnosis not present

## 2017-02-07 DIAGNOSIS — D649 Anemia, unspecified: Secondary | ICD-10-CM | POA: Diagnosis not present

## 2017-02-07 DIAGNOSIS — D8989 Other specified disorders involving the immune mechanism, not elsewhere classified: Secondary | ICD-10-CM | POA: Diagnosis not present

## 2017-02-14 ENCOUNTER — Encounter: Payer: Self-pay | Admitting: Physical Therapy

## 2017-02-14 NOTE — Therapy (Signed)
[facility] [address] [place], [place], [address] Phone: [phone]   Fax:  [phone]  Patient Details  Name: Steven Logan MRN: [id number] Date of Birth: [birth year] Referring Provider:  Mauricia Area, MD  Encounter Date: 02/14/2017  PHYSICAL THERAPY DISCHARGE SUMMARY  Visits from Start of Care: 16  Current functional level related to goals / functional outcomes: Patient has not returned since last PT appointment on 10/28/2016 so unknown outcomes.    Remaining deficits: Unknown   Education / Equipment: HEP  Plan: Patient agrees to discharge.  Patient goals were not met. Patient is being discharged due to not returning since the last visit.  ?????         Yliana Gravois PT, DPT 02/14/2017, 10:48 AM  [facility] [address] [place], [place], [address] Phone: [phone]   Fax:  [phone]

## 2017-02-15 DIAGNOSIS — I10 Essential (primary) hypertension: Secondary | ICD-10-CM | POA: Diagnosis not present

## 2017-02-15 DIAGNOSIS — Z794 Long term (current) use of insulin: Secondary | ICD-10-CM | POA: Diagnosis not present

## 2017-02-15 DIAGNOSIS — Z5181 Encounter for therapeutic drug level monitoring: Secondary | ICD-10-CM | POA: Diagnosis not present

## 2017-02-15 DIAGNOSIS — E872 Acidosis: Secondary | ICD-10-CM | POA: Diagnosis not present

## 2017-02-15 DIAGNOSIS — Z7952 Long term (current) use of systemic steroids: Secondary | ICD-10-CM | POA: Diagnosis not present

## 2017-02-15 DIAGNOSIS — D702 Other drug-induced agranulocytosis: Secondary | ICD-10-CM | POA: Diagnosis not present

## 2017-02-15 DIAGNOSIS — E119 Type 2 diabetes mellitus without complications: Secondary | ICD-10-CM | POA: Diagnosis not present

## 2017-02-15 DIAGNOSIS — Z940 Kidney transplant status: Secondary | ICD-10-CM | POA: Diagnosis not present

## 2017-02-15 DIAGNOSIS — D649 Anemia, unspecified: Secondary | ICD-10-CM | POA: Diagnosis not present

## 2017-02-15 DIAGNOSIS — Z79899 Other long term (current) drug therapy: Secondary | ICD-10-CM | POA: Diagnosis not present

## 2017-02-18 DIAGNOSIS — Z4822 Encounter for aftercare following kidney transplant: Secondary | ICD-10-CM | POA: Diagnosis not present

## 2017-02-21 DIAGNOSIS — Z4822 Encounter for aftercare following kidney transplant: Secondary | ICD-10-CM | POA: Diagnosis not present

## 2017-02-21 DIAGNOSIS — Z940 Kidney transplant status: Secondary | ICD-10-CM | POA: Diagnosis not present

## 2017-02-28 DIAGNOSIS — Z4822 Encounter for aftercare following kidney transplant: Secondary | ICD-10-CM | POA: Diagnosis not present

## 2017-03-07 DIAGNOSIS — Z940 Kidney transplant status: Secondary | ICD-10-CM | POA: Diagnosis not present

## 2017-03-07 DIAGNOSIS — R110 Nausea: Secondary | ICD-10-CM | POA: Diagnosis not present

## 2017-03-07 DIAGNOSIS — J069 Acute upper respiratory infection, unspecified: Secondary | ICD-10-CM | POA: Diagnosis not present

## 2017-03-07 DIAGNOSIS — E119 Type 2 diabetes mellitus without complications: Secondary | ICD-10-CM | POA: Diagnosis not present

## 2017-03-07 DIAGNOSIS — D72819 Decreased white blood cell count, unspecified: Secondary | ICD-10-CM | POA: Diagnosis not present

## 2017-03-07 DIAGNOSIS — T82868D Thrombosis of vascular prosthetic devices, implants and grafts, subsequent encounter: Secondary | ICD-10-CM | POA: Diagnosis not present

## 2017-03-07 DIAGNOSIS — Z79899 Other long term (current) drug therapy: Secondary | ICD-10-CM | POA: Diagnosis not present

## 2017-03-07 DIAGNOSIS — B259 Cytomegaloviral disease, unspecified: Secondary | ICD-10-CM | POA: Diagnosis not present

## 2017-03-07 DIAGNOSIS — D8989 Other specified disorders involving the immune mechanism, not elsewhere classified: Secondary | ICD-10-CM | POA: Diagnosis not present

## 2017-03-07 DIAGNOSIS — Z7952 Long term (current) use of systemic steroids: Secondary | ICD-10-CM | POA: Diagnosis not present

## 2017-03-07 DIAGNOSIS — D649 Anemia, unspecified: Secondary | ICD-10-CM | POA: Diagnosis not present

## 2017-03-07 DIAGNOSIS — Z4822 Encounter for aftercare following kidney transplant: Secondary | ICD-10-CM | POA: Diagnosis not present

## 2017-03-07 DIAGNOSIS — I10 Essential (primary) hypertension: Secondary | ICD-10-CM | POA: Diagnosis not present

## 2017-04-05 DIAGNOSIS — Z792 Long term (current) use of antibiotics: Secondary | ICD-10-CM | POA: Diagnosis not present

## 2017-04-05 DIAGNOSIS — Z940 Kidney transplant status: Secondary | ICD-10-CM | POA: Diagnosis not present

## 2017-04-05 DIAGNOSIS — Z7952 Long term (current) use of systemic steroids: Secondary | ICD-10-CM | POA: Diagnosis not present

## 2017-04-05 DIAGNOSIS — S43402A Unspecified sprain of left shoulder joint, initial encounter: Secondary | ICD-10-CM | POA: Diagnosis not present

## 2017-04-05 DIAGNOSIS — T82868D Thrombosis of vascular prosthetic devices, implants and grafts, subsequent encounter: Secondary | ICD-10-CM | POA: Diagnosis not present

## 2017-04-05 DIAGNOSIS — T82868A Thrombosis of vascular prosthetic devices, implants and grafts, initial encounter: Secondary | ICD-10-CM | POA: Diagnosis not present

## 2017-04-05 DIAGNOSIS — I770 Arteriovenous fistula, acquired: Secondary | ICD-10-CM | POA: Diagnosis not present

## 2017-04-05 DIAGNOSIS — D72819 Decreased white blood cell count, unspecified: Secondary | ICD-10-CM | POA: Diagnosis not present

## 2017-04-05 DIAGNOSIS — Z7984 Long term (current) use of oral hypoglycemic drugs: Secondary | ICD-10-CM | POA: Diagnosis not present

## 2017-04-05 DIAGNOSIS — D649 Anemia, unspecified: Secondary | ICD-10-CM | POA: Diagnosis not present

## 2017-04-05 DIAGNOSIS — E119 Type 2 diabetes mellitus without complications: Secondary | ICD-10-CM | POA: Diagnosis not present

## 2017-04-05 DIAGNOSIS — I10 Essential (primary) hypertension: Secondary | ICD-10-CM | POA: Diagnosis not present

## 2017-04-05 DIAGNOSIS — Z4822 Encounter for aftercare following kidney transplant: Secondary | ICD-10-CM | POA: Diagnosis not present

## 2017-04-05 DIAGNOSIS — D8989 Other specified disorders involving the immune mechanism, not elsewhere classified: Secondary | ICD-10-CM | POA: Diagnosis not present

## 2017-04-05 DIAGNOSIS — Z79899 Other long term (current) drug therapy: Secondary | ICD-10-CM | POA: Diagnosis not present

## 2017-04-05 DIAGNOSIS — S43422A Sprain of left rotator cuff capsule, initial encounter: Secondary | ICD-10-CM | POA: Insufficient documentation

## 2017-04-16 DIAGNOSIS — A072 Cryptosporidiosis: Secondary | ICD-10-CM | POA: Diagnosis not present

## 2017-04-16 DIAGNOSIS — Z940 Kidney transplant status: Secondary | ICD-10-CM | POA: Diagnosis not present

## 2017-04-16 DIAGNOSIS — N179 Acute kidney failure, unspecified: Secondary | ICD-10-CM | POA: Diagnosis not present

## 2017-04-16 DIAGNOSIS — R109 Unspecified abdominal pain: Secondary | ICD-10-CM | POA: Diagnosis not present

## 2017-04-16 DIAGNOSIS — R197 Diarrhea, unspecified: Secondary | ICD-10-CM | POA: Diagnosis not present

## 2017-04-16 DIAGNOSIS — Z79899 Other long term (current) drug therapy: Secondary | ICD-10-CM | POA: Diagnosis not present

## 2017-04-16 DIAGNOSIS — R110 Nausea: Secondary | ICD-10-CM | POA: Diagnosis not present

## 2017-04-21 DIAGNOSIS — Z79899 Other long term (current) drug therapy: Secondary | ICD-10-CM | POA: Diagnosis not present

## 2017-04-21 DIAGNOSIS — D72819 Decreased white blood cell count, unspecified: Secondary | ICD-10-CM | POA: Diagnosis not present

## 2017-04-21 DIAGNOSIS — M25512 Pain in left shoulder: Secondary | ICD-10-CM | POA: Diagnosis not present

## 2017-04-21 DIAGNOSIS — D899 Disorder involving the immune mechanism, unspecified: Secondary | ICD-10-CM | POA: Diagnosis not present

## 2017-04-21 DIAGNOSIS — T82868D Thrombosis of vascular prosthetic devices, implants and grafts, subsequent encounter: Secondary | ICD-10-CM | POA: Diagnosis not present

## 2017-04-21 DIAGNOSIS — A072 Cryptosporidiosis: Secondary | ICD-10-CM | POA: Insufficient documentation

## 2017-04-21 DIAGNOSIS — E118 Type 2 diabetes mellitus with unspecified complications: Secondary | ICD-10-CM | POA: Diagnosis not present

## 2017-04-21 DIAGNOSIS — N186 End stage renal disease: Secondary | ICD-10-CM | POA: Diagnosis not present

## 2017-04-21 DIAGNOSIS — I120 Hypertensive chronic kidney disease with stage 5 chronic kidney disease or end stage renal disease: Secondary | ICD-10-CM | POA: Diagnosis not present

## 2017-04-21 DIAGNOSIS — D631 Anemia in chronic kidney disease: Secondary | ICD-10-CM | POA: Diagnosis not present

## 2017-04-21 DIAGNOSIS — Z4822 Encounter for aftercare following kidney transplant: Secondary | ICD-10-CM | POA: Diagnosis not present

## 2017-04-21 DIAGNOSIS — Z992 Dependence on renal dialysis: Secondary | ICD-10-CM | POA: Diagnosis not present

## 2017-04-21 DIAGNOSIS — Z5181 Encounter for therapeutic drug level monitoring: Secondary | ICD-10-CM | POA: Diagnosis not present

## 2017-04-21 DIAGNOSIS — E1122 Type 2 diabetes mellitus with diabetic chronic kidney disease: Secondary | ICD-10-CM | POA: Diagnosis not present

## 2017-04-21 DIAGNOSIS — I10 Essential (primary) hypertension: Secondary | ICD-10-CM | POA: Diagnosis not present

## 2017-04-21 DIAGNOSIS — Z794 Long term (current) use of insulin: Secondary | ICD-10-CM | POA: Diagnosis not present

## 2017-04-21 DIAGNOSIS — Z940 Kidney transplant status: Secondary | ICD-10-CM | POA: Diagnosis not present

## 2017-04-22 DIAGNOSIS — N179 Acute kidney failure, unspecified: Secondary | ICD-10-CM | POA: Diagnosis not present

## 2017-04-22 DIAGNOSIS — E860 Dehydration: Secondary | ICD-10-CM | POA: Diagnosis not present

## 2017-04-22 DIAGNOSIS — Z79899 Other long term (current) drug therapy: Secondary | ICD-10-CM | POA: Diagnosis not present

## 2017-04-22 DIAGNOSIS — Z5181 Encounter for therapeutic drug level monitoring: Secondary | ICD-10-CM | POA: Diagnosis not present

## 2017-04-22 DIAGNOSIS — E872 Acidosis: Secondary | ICD-10-CM | POA: Diagnosis not present

## 2017-04-22 DIAGNOSIS — I120 Hypertensive chronic kidney disease with stage 5 chronic kidney disease or end stage renal disease: Secondary | ICD-10-CM | POA: Diagnosis not present

## 2017-04-22 DIAGNOSIS — Z940 Kidney transplant status: Secondary | ICD-10-CM | POA: Diagnosis not present

## 2017-04-22 DIAGNOSIS — N186 End stage renal disease: Secondary | ICD-10-CM | POA: Diagnosis not present

## 2017-04-22 DIAGNOSIS — E1122 Type 2 diabetes mellitus with diabetic chronic kidney disease: Secondary | ICD-10-CM | POA: Diagnosis not present

## 2017-04-22 DIAGNOSIS — Z992 Dependence on renal dialysis: Secondary | ICD-10-CM | POA: Diagnosis not present

## 2017-04-22 DIAGNOSIS — N041 Nephrotic syndrome with focal and segmental glomerular lesions: Secondary | ICD-10-CM | POA: Diagnosis not present

## 2017-04-22 DIAGNOSIS — Z794 Long term (current) use of insulin: Secondary | ICD-10-CM | POA: Diagnosis not present

## 2017-04-22 DIAGNOSIS — Z4822 Encounter for aftercare following kidney transplant: Secondary | ICD-10-CM | POA: Diagnosis not present

## 2017-04-28 DIAGNOSIS — Z940 Kidney transplant status: Secondary | ICD-10-CM | POA: Diagnosis not present

## 2017-04-28 DIAGNOSIS — Z4822 Encounter for aftercare following kidney transplant: Secondary | ICD-10-CM | POA: Diagnosis not present

## 2017-05-02 DIAGNOSIS — D8989 Other specified disorders involving the immune mechanism, not elsewhere classified: Secondary | ICD-10-CM | POA: Diagnosis not present

## 2017-05-02 DIAGNOSIS — I82890 Acute embolism and thrombosis of other specified veins: Secondary | ICD-10-CM | POA: Diagnosis not present

## 2017-05-02 DIAGNOSIS — Z4822 Encounter for aftercare following kidney transplant: Secondary | ICD-10-CM | POA: Diagnosis not present

## 2017-05-02 DIAGNOSIS — Z79899 Other long term (current) drug therapy: Secondary | ICD-10-CM | POA: Diagnosis not present

## 2017-05-02 DIAGNOSIS — T82868D Thrombosis of vascular prosthetic devices, implants and grafts, subsequent encounter: Secondary | ICD-10-CM | POA: Diagnosis not present

## 2017-05-02 DIAGNOSIS — E119 Type 2 diabetes mellitus without complications: Secondary | ICD-10-CM | POA: Diagnosis not present

## 2017-05-02 DIAGNOSIS — D72819 Decreased white blood cell count, unspecified: Secondary | ICD-10-CM | POA: Diagnosis not present

## 2017-05-02 DIAGNOSIS — D649 Anemia, unspecified: Secondary | ICD-10-CM | POA: Diagnosis not present

## 2017-05-02 DIAGNOSIS — I10 Essential (primary) hypertension: Secondary | ICD-10-CM | POA: Diagnosis not present

## 2017-05-02 DIAGNOSIS — Z940 Kidney transplant status: Secondary | ICD-10-CM | POA: Diagnosis not present

## 2017-05-02 DIAGNOSIS — S43402D Unspecified sprain of left shoulder joint, subsequent encounter: Secondary | ICD-10-CM | POA: Diagnosis not present

## 2017-05-02 DIAGNOSIS — Z7984 Long term (current) use of oral hypoglycemic drugs: Secondary | ICD-10-CM | POA: Diagnosis not present

## 2017-05-02 DIAGNOSIS — S43402A Unspecified sprain of left shoulder joint, initial encounter: Secondary | ICD-10-CM | POA: Diagnosis not present

## 2017-05-02 DIAGNOSIS — Z7952 Long term (current) use of systemic steroids: Secondary | ICD-10-CM | POA: Diagnosis not present

## 2017-05-02 DIAGNOSIS — R110 Nausea: Secondary | ICD-10-CM | POA: Diagnosis not present

## 2017-05-02 DIAGNOSIS — A072 Cryptosporidiosis: Secondary | ICD-10-CM | POA: Diagnosis not present

## 2017-05-02 DIAGNOSIS — Z8619 Personal history of other infectious and parasitic diseases: Secondary | ICD-10-CM | POA: Diagnosis not present

## 2017-05-02 DIAGNOSIS — R531 Weakness: Secondary | ICD-10-CM | POA: Diagnosis not present

## 2017-05-07 ENCOUNTER — Inpatient Hospital Stay (HOSPITAL_COMMUNITY)
Admission: EM | Admit: 2017-05-07 | Discharge: 2017-05-10 | DRG: 872 | Disposition: A | Discharge status: Home / Self Care | Payer: Medicare Other | Attending: Internal Medicine | Admitting: Internal Medicine

## 2017-05-07 ENCOUNTER — Emergency Department (HOSPITAL_COMMUNITY): Payer: Medicare Other

## 2017-05-07 ENCOUNTER — Encounter (HOSPITAL_COMMUNITY): Payer: Self-pay | Admitting: Emergency Medicine

## 2017-05-07 DIAGNOSIS — D509 Iron deficiency anemia, unspecified: Secondary | ICD-10-CM | POA: Diagnosis present

## 2017-05-07 DIAGNOSIS — E875 Hyperkalemia: Secondary | ICD-10-CM | POA: Diagnosis present

## 2017-05-07 DIAGNOSIS — M7989 Other specified soft tissue disorders: Secondary | ICD-10-CM | POA: Diagnosis not present

## 2017-05-07 DIAGNOSIS — Z89611 Acquired absence of right leg above knee: Secondary | ICD-10-CM

## 2017-05-07 DIAGNOSIS — R531 Weakness: Secondary | ICD-10-CM | POA: Diagnosis not present

## 2017-05-07 DIAGNOSIS — Z800 Family history of malignant neoplasm of digestive organs: Secondary | ICD-10-CM

## 2017-05-07 DIAGNOSIS — M109 Gout, unspecified: Secondary | ICD-10-CM | POA: Diagnosis present

## 2017-05-07 DIAGNOSIS — L03116 Cellulitis of left lower limb: Secondary | ICD-10-CM | POA: Diagnosis not present

## 2017-05-07 DIAGNOSIS — F329 Major depressive disorder, single episode, unspecified: Secondary | ICD-10-CM | POA: Diagnosis present

## 2017-05-07 DIAGNOSIS — R509 Fever, unspecified: Secondary | ICD-10-CM | POA: Diagnosis not present

## 2017-05-07 DIAGNOSIS — Z823 Family history of stroke: Secondary | ICD-10-CM

## 2017-05-07 DIAGNOSIS — I10 Essential (primary) hypertension: Secondary | ICD-10-CM | POA: Diagnosis present

## 2017-05-07 DIAGNOSIS — N184 Chronic kidney disease, stage 4 (severe): Secondary | ICD-10-CM | POA: Diagnosis present

## 2017-05-07 DIAGNOSIS — Z86711 Personal history of pulmonary embolism: Secondary | ICD-10-CM

## 2017-05-07 DIAGNOSIS — Z7984 Long term (current) use of oral hypoglycemic drugs: Secondary | ICD-10-CM

## 2017-05-07 DIAGNOSIS — Z86718 Personal history of other venous thrombosis and embolism: Secondary | ICD-10-CM

## 2017-05-07 DIAGNOSIS — R404 Transient alteration of awareness: Secondary | ICD-10-CM | POA: Diagnosis not present

## 2017-05-07 DIAGNOSIS — E785 Hyperlipidemia, unspecified: Secondary | ICD-10-CM | POA: Diagnosis present

## 2017-05-07 DIAGNOSIS — Z940 Kidney transplant status: Secondary | ICD-10-CM | POA: Diagnosis not present

## 2017-05-07 DIAGNOSIS — Z8249 Family history of ischemic heart disease and other diseases of the circulatory system: Secondary | ICD-10-CM

## 2017-05-07 DIAGNOSIS — N189 Chronic kidney disease, unspecified: Secondary | ICD-10-CM | POA: Diagnosis present

## 2017-05-07 DIAGNOSIS — Z833 Family history of diabetes mellitus: Secondary | ICD-10-CM

## 2017-05-07 DIAGNOSIS — Z79899 Other long term (current) drug therapy: Secondary | ICD-10-CM

## 2017-05-07 DIAGNOSIS — D631 Anemia in chronic kidney disease: Secondary | ICD-10-CM | POA: Diagnosis present

## 2017-05-07 DIAGNOSIS — A419 Sepsis, unspecified organism: Secondary | ICD-10-CM | POA: Diagnosis not present

## 2017-05-07 DIAGNOSIS — M79672 Pain in left foot: Secondary | ICD-10-CM | POA: Diagnosis not present

## 2017-05-07 DIAGNOSIS — E1122 Type 2 diabetes mellitus with diabetic chronic kidney disease: Secondary | ICD-10-CM | POA: Diagnosis present

## 2017-05-07 LAB — URINALYSIS, ROUTINE W REFLEX MICROSCOPIC
BACTERIA UA: NONE SEEN
Bilirubin Urine: NEGATIVE
GLUCOSE, UA: NEGATIVE mg/dL
KETONES UR: 5 mg/dL — AB
LEUKOCYTES UA: NEGATIVE
Nitrite: NEGATIVE
PROTEIN: 30 mg/dL — AB
SQUAMOUS EPITHELIAL / LPF: NONE SEEN
Specific Gravity, Urine: 1.018 (ref 1.005–1.030)
pH: 6 (ref 5.0–8.0)

## 2017-05-07 LAB — COMPREHENSIVE METABOLIC PANEL
ALT: 18 U/L (ref 17–63)
AST: 22 U/L (ref 15–41)
Albumin: 3.9 g/dL (ref 3.5–5.0)
Alkaline Phosphatase: 104 U/L (ref 38–126)
Anion gap: 16 — ABNORMAL HIGH (ref 5–15)
BUN: 35 mg/dL — ABNORMAL HIGH (ref 6–20)
CALCIUM: 6.9 mg/dL — AB (ref 8.9–10.3)
CHLORIDE: 107 mmol/L (ref 101–111)
CO2: 16 mmol/L — AB (ref 22–32)
CREATININE: 2.4 mg/dL — AB (ref 0.61–1.24)
GFR calc Af Amer: 35 mL/min — ABNORMAL LOW (ref >60)
GFR calc non Af Amer: 30 mL/min — ABNORMAL LOW (ref >60)
GLUCOSE: 108 mg/dL — AB (ref 65–99)
Potassium: 5.5 mmol/L — ABNORMAL HIGH (ref 3.5–5.1)
SODIUM: 139 mmol/L (ref 135–145)
Total Bilirubin: 1.3 mg/dL — ABNORMAL HIGH (ref 0.3–1.2)
Total Protein: 7.8 g/dL (ref 6.5–8.1)

## 2017-05-07 LAB — CBC WITH DIFFERENTIAL/PLATELET
BASOS ABS: 0 10*3/uL (ref 0.0–0.1)
BASOS PCT: 0 %
EOS ABS: 0.2 10*3/uL (ref 0.0–0.7)
EOS PCT: 4 %
HCT: 37.4 % — ABNORMAL LOW (ref 39.0–52.0)
HEMOGLOBIN: 11.9 g/dL — AB (ref 13.0–17.0)
LYMPHS ABS: 0.9 10*3/uL (ref 0.7–4.0)
Lymphocytes Relative: 17 %
MCH: 26.7 pg (ref 26.0–34.0)
MCHC: 31.8 g/dL (ref 30.0–36.0)
MCV: 84 fL (ref 78.0–100.0)
Monocytes Absolute: 0.6 10*3/uL (ref 0.1–1.0)
Monocytes Relative: 12 %
NEUTROS PCT: 67 %
Neutro Abs: 3.4 10*3/uL (ref 1.7–7.7)
PLATELETS: 249 10*3/uL (ref 150–400)
RBC: 4.45 MIL/uL (ref 4.22–5.81)
RDW: 14.1 % (ref 11.5–15.5)
WBC: 5.1 10*3/uL (ref 4.0–10.5)

## 2017-05-07 LAB — I-STAT CG4 LACTIC ACID, ED: Lactic Acid, Venous: 1.42 mmol/L (ref 0.5–1.9)

## 2017-05-07 MED ORDER — SODIUM CHLORIDE 0.9 % IV BOLUS
1000.0000 mL | Freq: Once | INTRAVENOUS | Status: AC
  Administered 2017-05-07: 1000 mL via INTRAVENOUS

## 2017-05-07 MED ORDER — PIPERACILLIN-TAZOBACTAM 3.375 G IVPB 30 MIN
3.3750 g | Freq: Once | INTRAVENOUS | Status: AC
  Administered 2017-05-07: 3.375 g via INTRAVENOUS
  Filled 2017-05-07: qty 50

## 2017-05-07 NOTE — ED Triage Notes (Signed)
Brought in by EMS from home, Pt complain of fever  And weakness started yesterday. Per EMS Pt had a temp of 102 and had a tylenol 1000mg  at 2020. Patient  vomitted x1 no diarrhea. Hx of kidney transplant last may 2018.

## 2017-05-07 NOTE — ED Notes (Signed)
Bed: XG68 Expected date:  Expected time:  Means of arrival:  Comments: 38M - fever/kidney recipient

## 2017-05-07 NOTE — ED Provider Notes (Signed)
[facility] DEPT Provider Note   CSN: [id number] Arrival date & time: 05/07/17  2052     History   Chief Complaint Chief Complaint  Patient presents with  . Fever  . Weakness    HPI Steven Logan is a 47 y.o. male.  The history is provided by the patient. No language interpreter was used.  Fever    Weakness    Steven Logan is a 47 y.o. male who presents to the Emergency Department complaining of fever. He presents to the emergency department via EMS for evaluation of fever that started today. He states that he was feeling well when he woke this morning and he had pain and swelling to the left dorsal foot. No reports of injuries. This afternoon he states that his neighbor called 911 because he had a fever. He reports feeling fine otherwise. He did receive the medication by EMS and vomited on the way to the hospital. He denies any nausea, abdominal pain, chest pain, cough, shortness of breath, dysuria. He has a history of renal transplant and recently has been treated for cryptosporidium with antibiotics for the last two weeks. Past Medical History:  Diagnosis Date  . Abscess    great toe  . Anemia   . Arthritis   . Chronic kidney disease    esrd  . Depression   . Diabetes mellitus   . Dyslipidemia   . Eczema   . Gout   . HTN (hypertension)   . Morbid obesity ([place])   . Pancreatitis   . Pneumonia    2-3 years ago  . Pulmonary embolism ([place])    3  in  lungs  at  one time...  . Renal hypertension    Hemo started  Sept 2014- MWF  . Shortness of breath    ?? chest  cold he has now.  10/8- no SOB    Patient Active Problem List   Diagnosis Date Noted  . History of right above knee amputation ([place]) 07/12/2016  . Acute blood loss anemia 06/25/2013  . Hyperglycemia 06/07/2013  . Arm DVT (deep venous thromboembolism), acute ([place]) 06/07/2013  . ESRD on dialysis ([place]) 06/05/2013  . Encephalopathy, toxic 06/03/2013  . Cellulitis and  abscess of leg 06/03/2013  . Anemia in chronic kidney disease 06/03/2013  . Arm edema 06/03/2013  . Leg abscess 06/03/2013  . Severe sepsis ([place]) 06/03/2013  . Septic shock ([place]) 06/03/2013  . Septic arthritis of ankle or foot, right 06/03/2013  . Abscess of right lower leg 06/02/2013  . Wrist lump 07/05/2012  . Swelling of limb-Left index and middle fingers 07/05/2012  . Aftercare following surgery of the circulatory system, [place] 07/05/2012  . Tingling-left wrist and some pain. 07/05/2012  . Pulmonary edema 05/04/2012  . Acute respiratory failure ([place]) 05/04/2012  . CKD (chronic kidney disease) stage 5, GFR less than 15 ml/min (HCC) 05/04/2012  . Hyperkalemia 05/04/2012  . Pre-operative cardiovascular examination 03/15/2012  . Iron deficiency anemia 09/22/2010  . HTN (hypertension) 09/22/2010  . Hyperlipidemia 09/22/2010  . Gout 09/22/2010  . CRI (chronic renal insufficiency) 09/22/2010  . Pulmonary embolism, bilateral ([place]) 09/22/2010    Past Surgical History:  Procedure Laterality Date  . AMPUTATION Right 06/12/2013   Procedure: Transtibial Amputation;  Surgeon: Newt Minion, MD;  Location: [facility];  Service: Orthopedics;  Laterality: Right;  . AMPUTATION Right 06/27/2013   Procedure: AMPUTATION ABOVE KNEE;  Surgeon: Newt Minion, MD;  Location: [facility];  Service: Orthopedics;  Laterality: Right;  Right Above Knee Amputation  . AV FISTULA PLACEMENT Left 04/07/2012   Procedure: ARTERIOVENOUS (AV) FISTULA CREATION;  Surgeon: Angelia Mould, MD;  Location: [facility];  Service: Vascular;  Laterality: Left;  . FISTULOGRAM Left 08/07/2012   Procedure: FISTULOGRAM;  Surgeon: Angelia Mould, MD;  Location: [facility] CATH LAB;  Service: Cardiovascular;  Laterality: Left;  arm  . I&D EXTREMITY Right 06/02/2013   Procedure: IRRIGATION AND DEBRIDEMENT ARTHROSCOPIC RIGHT ANKLE;  Surgeon: Augustin Schooling, MD;  Location: [facility];  Service: Orthopedics;  Laterality: Right;  . I&D EXTREMITY Right  06/04/2013   Procedure: IRRIGATION AND DEBRIDEMENT RIGHT FOOT/ANKLE;  Surgeon: Newt Minion, MD;  Location: [facility];  Service: Orthopedics;  Laterality: Right;  . I&D EXTREMITY Right 06/06/2013   Procedure: IRRIGATION AND DEBRIDEMENT EXTREMITY;  Surgeon: Newt Minion, MD;  Location: [facility];  Service: Orthopedics;  Laterality: Right;  . I&D EXTREMITY Right 06/24/2013   Procedure: IRRIGATION AND DEBRIDEMENT and Revsion of TRANSTIBIAL AMPUTATION;  Surgeon: Newt Minion, MD;  Location: [facility];  Service: Orthopedics;  Laterality: Right;  . INSERTION OF DIALYSIS CATHETER Right 09/19/2012   Procedure: INSERTION OF DIALYSIS CATHETER;  Surgeon: Angelia Mould, MD;  Location: [facility];  Service: Vascular;  Laterality: Right;  . LIGATION OF COMPETING BRANCHES OF ARTERIOVENOUS FISTULA Left 09/19/2012   Procedure: LIGATION OF COMPETING BRANCHES OF LEFT ARM ARTERIOVENOUS FISTULA; Vein angioplasty;  Surgeon: Angelia Mould, MD;  Location: [facility];  Service: Vascular;  Laterality: Left;  . none          Home Medications    Prior to Admission medications   Medication Sig Start Date End Date Taking? Authorizing Provider  ALINIA 500 MG tablet Take 500 mg by mouth 2 (two) times daily. 04/21/17  Yes [provider]  amLODipine (NORVASC) 10 MG tablet Take 10 mg by mouth daily after breakfast.  04/13/17  Yes [provider]  JANUVIA 50 MG tablet Take 50 mg by mouth daily after breakfast.  03/06/17  Yes [provider]  labetalol (NORMODYNE) 200 MG tablet Take 100 mg by mouth 2 (two) times daily.    Yes [provider]  mycophenolate (MYFORTIC) 180 MG EC tablet Take 360 mg by mouth 2 (two) times daily.  05/03/17  Yes [provider]  tacrolimus (PROGRAF) 1 MG capsule Take 2 mg by mouth 2 (two) times daily. 05/03/17  Yes [provider]  calcitRIOL (ROCALTROL) 0.5 MCG capsule Take 2 capsules (1 mcg total) by mouth every Monday, Wednesday, and Friday with  hemodialysis. Patient not taking: Reported on 05/08/2017 07/02/13   Bonnielee Haff, MD  docusate sodium (COLACE) 100 MG capsule Take 1 capsule (100 mg total) by mouth every 12 (twelve) hours. Patient not taking: Reported on 05/08/2017 05/25/15   [name], MD  polyethylene glycol (MIRALAX / GLYCOLAX) packet Take 17 g by mouth daily. Patient not taking: Reported on 05/08/2017 05/25/15   [name], MD    Family History Family History  Problem Relation Age of Onset  . Stroke Mother   . Diabetes Mother   . Hypertension Mother   . Hypertension Father   . Cancer Unknown   . Coronary artery disease Unknown   . Colon cancer Maternal Grandmother 52  . Hypertension Brother     Social History Social History   Tobacco Use  . Smoking status: Never Smoker  . Smokeless tobacco: Never Used  Substance Use Topics  .  Alcohol use: No    Alcohol/week: 0.0 oz    Comment: occ  . Drug use: Yes    Types: Marijuana    Comment: random, last 2 weeks ago     Allergies   Patient has no known allergies.   Review of Systems Review of Systems  Constitutional: Positive for fever.  Neurological: Positive for weakness.  All other systems reviewed and are negative.    Physical Exam Updated Vital Signs BP (!) 116/100 (BP Location: Right Arm)   Pulse 92   Temp (!) 101.2 F (38.4 C) (Oral)   Resp 20   Ht 6' (1.829 m)   Wt 98.9 kg (218 lb)   SpO2 98%   BMI 29.57 kg/m   Physical Exam  Constitutional: He is oriented to person, place, and time. He appears well-developed and well-nourished.  HENT:  Head: Normocephalic and atraumatic.  Cardiovascular: Regular rhythm.  No murmur heard. Tachycardic  Pulmonary/Chest: Effort normal and breath sounds normal. No respiratory distress.  Abdominal: Soft. There is no tenderness. There is no rebound and no guarding.  Musculoskeletal:  Right lower extremity a.k.a. Left lower extremity with mild edema and erythema to the dorsal foot  and ankle. There is no significant tenderness to palpation. 2+ DP pulses in the left lower extremity.  Neurological: He is alert and oriented to person, place, and time.  Skin: Skin is warm and dry.  Psychiatric: He has a normal mood and affect. His behavior is normal.  Nursing note and vitals reviewed.    ED Treatments / Results  Labs (all labs ordered are listed, but only abnormal results are displayed) Labs Reviewed  COMPREHENSIVE METABOLIC PANEL - Abnormal; Notable for the following components:      Result Value   Potassium 5.5 (*)    CO2 16 (*)    Glucose, Bld 108 (*)    BUN 35 (*)    Creatinine, Ser 2.40 (*)    Calcium 6.9 (*)    Total Bilirubin 1.3 (*)    GFR calc non Af Amer 30 (*)    GFR calc Af Amer 35 (*)    Anion gap 16 (*)    All other components within normal limits  CBC WITH DIFFERENTIAL/PLATELET - Abnormal; Notable for the following components:   Hemoglobin 11.9 (*)    HCT 37.4 (*)    All other components within normal limits  URINALYSIS, ROUTINE W REFLEX MICROSCOPIC - Abnormal; Notable for the following components:   Hgb urine dipstick SMALL (*)    Ketones, ur 5 (*)    Protein, ur 30 (*)    All other components within normal limits  CULTURE, BLOOD (ROUTINE X 2)  CULTURE, BLOOD (ROUTINE X 2)  I-STAT CG4 LACTIC ACID, ED  I-STAT CG4 LACTIC ACID, ED    EKG EKG Interpretation  Date/Time:  Sunday May 08 2017 00:04:17 EDT Ventricular Rate:  95 PR Interval:    QRS Duration: 98 QT Interval:  345 QTC Calculation: 434 R Axis:   19 Text Interpretation:  Sinus rhythm Left atrial enlargement Low voltage, precordial leads Baseline wander in lead(s) V2 Confirmed by Quintella Reichert [phone]) on 05/08/2017 12:06:19 AM   Radiology Dg Chest 2 View  Result Date: 05/07/2017 CLINICAL DATA:  Fever, weakness EXAM: CHEST - 2 VIEW COMPARISON:  05/25/2015 FINDINGS: Lungs are clear.  No pleural effusion or pneumothorax. The heart is normal in size. Degenerative changes of  the visualized thoracolumbar spine. Prominent anterior osteophytosis along the lower thoracic spine. IMPRESSION: No  evidence of acute cardiopulmonary disease. Electronically Signed   By: Julian Hy M.D.   On: 05/07/2017 22:30   Dg Foot Complete Left  Result Date: 05/07/2017 CLINICAL DATA:  Fever, left foot swelling, pain at 1st digit EXAM: LEFT FOOT - COMPLETE 3+ VIEW COMPARISON:  None. FINDINGS: Cortical irregularity/destruction involving the proximal aspect of the 5th proximal phalanx. This appearance favors nonunion of a prior fracture, although superimposed infection is possible. No evidence of acute fracture or dislocation. The joint spaces are preserved. Visualized soft tissues are within normal limits. Small plantar calcaneal enthesophyte. IMPRESSION: Cortical irregularity/destruction involving the proximal aspect of the 5th proximal phalanx, favoring nonunion of a prior fracture, although possibly with superimposed infection. No findings to account for the patients pain at the 1st digit. Electronically Signed   By: Julian Hy M.D.   On: 05/07/2017 22:35    Procedures Procedures (including critical care time)  Medications Ordered in ED Medications  vancomycin (VANCOCIN) 2,000 mg in sodium chloride 0.9 % 500 mL IVPB (has no administration in time range)  sodium chloride 0.9 % bolus 1,000 mL (0 mLs Intravenous Stopped 05/07/17 2334)  piperacillin-tazobactam (ZOSYN) IVPB 3.375 g (0 g Intravenous Stopped 05/07/17 2309)  sodium chloride 0.9 % bolus 1,000 mL (0 mLs Intravenous Stopped 05/08/17 0047)     Initial Impression / Assessment and Plan / ED Course  I have reviewed the triage vital signs and the nursing notes.  Pertinent labs & imaging results that were available during my care of the patient were reviewed by me and considered in my medical decision making (see chart for details).     Patient with history of renal transplant here for evaluation of fever that started  today. He does note some swelling to his left foot, no reports of injuries. Examination with questionable early cellulitis, no evidence of gout or source to the foot. Labs demonstrate slight worsening in his chronic renal insufficiency when compared to priors. CBC with improved white blood cell count, typically he is leukopenia. No evidence of UTI or pneumonia. Discussed with on-call transplant physician with [facility]. Given recent clean biopsy he is not concerned for acute rejection or renal failure. There are no current beds available at [facility], given fever he states the patient can be treated with. Antibiotics. Hospitalist Dr. Jonelle Sidle consulted for observation admission given fever in an immunocompromise patient. Patient updated findings of studies and he is in agreement with plan.  Final Clinical Impressions(s) / ED Diagnoses   Final diagnoses:  None    ED Discharge Orders    None       Quintella Reichert, MD 05/08/17 0102

## 2017-05-07 NOTE — ED Notes (Signed)
Pt is drinking fluids, pt states he doesn't have to urinate at the moment a urinal has been placed at bedside for him to use it when he gets ready to.

## 2017-05-08 ENCOUNTER — Other Ambulatory Visit: Payer: Self-pay

## 2017-05-08 ENCOUNTER — Encounter (HOSPITAL_COMMUNITY): Payer: Self-pay

## 2017-05-08 DIAGNOSIS — Z86711 Personal history of pulmonary embolism: Secondary | ICD-10-CM | POA: Diagnosis not present

## 2017-05-08 DIAGNOSIS — Z8249 Family history of ischemic heart disease and other diseases of the circulatory system: Secondary | ICD-10-CM | POA: Diagnosis not present

## 2017-05-08 DIAGNOSIS — A419 Sepsis, unspecified organism: Secondary | ICD-10-CM | POA: Diagnosis present

## 2017-05-08 DIAGNOSIS — Z86718 Personal history of other venous thrombosis and embolism: Secondary | ICD-10-CM | POA: Diagnosis not present

## 2017-05-08 DIAGNOSIS — Z940 Kidney transplant status: Secondary | ICD-10-CM | POA: Diagnosis not present

## 2017-05-08 DIAGNOSIS — Z7984 Long term (current) use of oral hypoglycemic drugs: Secondary | ICD-10-CM | POA: Diagnosis not present

## 2017-05-08 DIAGNOSIS — D631 Anemia in chronic kidney disease: Secondary | ICD-10-CM | POA: Diagnosis present

## 2017-05-08 DIAGNOSIS — Z79899 Other long term (current) drug therapy: Secondary | ICD-10-CM | POA: Diagnosis not present

## 2017-05-08 DIAGNOSIS — L03116 Cellulitis of left lower limb: Secondary | ICD-10-CM | POA: Diagnosis present

## 2017-05-08 DIAGNOSIS — F329 Major depressive disorder, single episode, unspecified: Secondary | ICD-10-CM | POA: Diagnosis present

## 2017-05-08 DIAGNOSIS — R509 Fever, unspecified: Secondary | ICD-10-CM | POA: Diagnosis present

## 2017-05-08 DIAGNOSIS — Z833 Family history of diabetes mellitus: Secondary | ICD-10-CM | POA: Diagnosis not present

## 2017-05-08 DIAGNOSIS — E785 Hyperlipidemia, unspecified: Secondary | ICD-10-CM | POA: Diagnosis present

## 2017-05-08 DIAGNOSIS — N184 Chronic kidney disease, stage 4 (severe): Secondary | ICD-10-CM | POA: Diagnosis present

## 2017-05-08 DIAGNOSIS — M109 Gout, unspecified: Secondary | ICD-10-CM | POA: Diagnosis present

## 2017-05-08 DIAGNOSIS — E1122 Type 2 diabetes mellitus with diabetic chronic kidney disease: Secondary | ICD-10-CM | POA: Diagnosis present

## 2017-05-08 DIAGNOSIS — Z800 Family history of malignant neoplasm of digestive organs: Secondary | ICD-10-CM | POA: Diagnosis not present

## 2017-05-08 DIAGNOSIS — Z823 Family history of stroke: Secondary | ICD-10-CM | POA: Diagnosis not present

## 2017-05-08 DIAGNOSIS — D509 Iron deficiency anemia, unspecified: Secondary | ICD-10-CM | POA: Diagnosis present

## 2017-05-08 DIAGNOSIS — Z89611 Acquired absence of right leg above knee: Secondary | ICD-10-CM | POA: Diagnosis not present

## 2017-05-08 DIAGNOSIS — E875 Hyperkalemia: Secondary | ICD-10-CM | POA: Diagnosis present

## 2017-05-08 LAB — CBC
HEMATOCRIT: 33.1 % — AB (ref 39.0–52.0)
HEMOGLOBIN: 10.4 g/dL — AB (ref 13.0–17.0)
MCH: 27 pg (ref 26.0–34.0)
MCHC: 31.4 g/dL (ref 30.0–36.0)
MCV: 86 fL (ref 78.0–100.0)
Platelets: 205 10*3/uL (ref 150–400)
RBC: 3.85 MIL/uL — AB (ref 4.22–5.81)
RDW: 14.4 % (ref 11.5–15.5)
WBC: 4.5 10*3/uL (ref 4.0–10.5)

## 2017-05-08 LAB — COMPREHENSIVE METABOLIC PANEL
ALBUMIN: 3.3 g/dL — AB (ref 3.5–5.0)
ALK PHOS: 86 U/L (ref 38–126)
ALT: 15 U/L — ABNORMAL LOW (ref 17–63)
ANION GAP: 12 (ref 5–15)
AST: 14 U/L — AB (ref 15–41)
BILIRUBIN TOTAL: 0.8 mg/dL (ref 0.3–1.2)
BUN: 33 mg/dL — ABNORMAL HIGH (ref 6–20)
CO2: 19 mmol/L — ABNORMAL LOW (ref 22–32)
Calcium: 6.4 mg/dL — CL (ref 8.9–10.3)
Chloride: 108 mmol/L (ref 101–111)
Creatinine, Ser: 2.1 mg/dL — ABNORMAL HIGH (ref 0.61–1.24)
GFR calc Af Amer: 42 mL/min — ABNORMAL LOW (ref >60)
GFR, EST NON AFRICAN AMERICAN: 36 mL/min — AB (ref >60)
GLUCOSE: 108 mg/dL — AB (ref 65–99)
Potassium: 4.3 mmol/L (ref 3.5–5.1)
Sodium: 139 mmol/L (ref 135–145)
Total Protein: 6.6 g/dL (ref 6.5–8.1)

## 2017-05-08 LAB — GLUCOSE, CAPILLARY
Glucose-Capillary: 108 mg/dL — ABNORMAL HIGH (ref 65–99)
Glucose-Capillary: 100 mg/dL — ABNORMAL HIGH (ref 65–99)
Glucose-Capillary: 136 mg/dL — ABNORMAL HIGH (ref 65–99)
Glucose-Capillary: 151 mg/dL — ABNORMAL HIGH (ref 65–99)

## 2017-05-08 LAB — HEMOGLOBIN A1C
Hgb A1c MFr Bld: 5.8 % — ABNORMAL HIGH (ref 4.8–5.6)
Mean Plasma Glucose: 119.76 mg/dL

## 2017-05-08 MED ORDER — TACROLIMUS 1 MG PO CAPS
2.0000 mg | ORAL_CAPSULE | Freq: Two times a day (BID) | ORAL | Status: DC
  Administered 2017-05-08 – 2017-05-10 (×5): 2 mg via ORAL
  Filled 2017-05-08 (×5): qty 2

## 2017-05-08 MED ORDER — VANCOMYCIN HCL 10 G IV SOLR
1750.0000 mg | INTRAVENOUS | Status: DC
  Administered 2017-05-09: 1750 mg via INTRAVENOUS
  Filled 2017-05-08: qty 1750

## 2017-05-08 MED ORDER — SODIUM CHLORIDE 0.9 % IV SOLN: INTRAVENOUS | Status: DC

## 2017-05-08 MED ORDER — LABETALOL HCL 100 MG PO TABS
100.0000 mg | ORAL_TABLET | Freq: Two times a day (BID) | ORAL | Status: DC
  Administered 2017-05-08 – 2017-05-10 (×5): 100 mg via ORAL
  Filled 2017-05-08 (×6): qty 1

## 2017-05-08 MED ORDER — VANCOMYCIN HCL 10 G IV SOLR
2000.0000 mg | Freq: Once | INTRAVENOUS | Status: AC
  Administered 2017-05-08: 2000 mg via INTRAVENOUS
  Filled 2017-05-08: qty 2000

## 2017-05-08 MED ORDER — SODIUM CHLORIDE 0.9 % IV SOLN
2.0000 g | Freq: Once | INTRAVENOUS | Status: AC
  Administered 2017-05-08: 2 g via INTRAVENOUS
  Filled 2017-05-08: qty 20

## 2017-05-08 MED ORDER — LINAGLIPTIN 5 MG PO TABS
5.0000 mg | ORAL_TABLET | Freq: Every day | ORAL | Status: DC
  Administered 2017-05-08 – 2017-05-10 (×3): 5 mg via ORAL
  Filled 2017-05-08 (×3): qty 1

## 2017-05-08 MED ORDER — PIPERACILLIN-TAZOBACTAM 3.375 G IVPB
3.3750 g | Freq: Three times a day (TID) | INTRAVENOUS | Status: DC
  Administered 2017-05-08 – 2017-05-09 (×4): 3.375 g via INTRAVENOUS
  Filled 2017-05-08 (×8): qty 50

## 2017-05-08 MED ORDER — AMLODIPINE BESYLATE 10 MG PO TABS
10.0000 mg | ORAL_TABLET | Freq: Every day | ORAL | Status: DC
  Administered 2017-05-08 – 2017-05-10 (×3): 10 mg via ORAL
  Filled 2017-05-08 (×3): qty 1

## 2017-05-08 MED ORDER — HEPARIN SODIUM (PORCINE) 5000 UNIT/ML IJ SOLN
5000.0000 [IU] | Freq: Three times a day (TID) | INTRAMUSCULAR | Status: DC
  Administered 2017-05-08 – 2017-05-10 (×7): 5000 [IU] via SUBCUTANEOUS
  Filled 2017-05-08 (×7): qty 1

## 2017-05-08 MED ORDER — ONDANSETRON HCL 4 MG/2ML IJ SOLN: 4.0000 mg | Freq: Four times a day (QID) | INTRAMUSCULAR | Status: DC | PRN

## 2017-05-08 MED ORDER — ONDANSETRON HCL 4 MG PO TABS: 4.0000 mg | ORAL_TABLET | Freq: Four times a day (QID) | ORAL | Status: DC | PRN

## 2017-05-08 MED ORDER — MYCOPHENOLATE SODIUM 180 MG PO TBEC
360.0000 mg | DELAYED_RELEASE_TABLET | Freq: Two times a day (BID) | ORAL | Status: DC
  Administered 2017-05-08 – 2017-05-10 (×5): 360 mg via ORAL
  Filled 2017-05-08 (×5): qty 2

## 2017-05-08 MED ORDER — NITAZOXANIDE 500 MG PO TABS: 500.0000 mg | ORAL_TABLET | Freq: Two times a day (BID) | ORAL | Status: AC

## 2017-05-08 MED ORDER — INSULIN ASPART 100 UNIT/ML ~~LOC~~ SOLN
0.0000 [IU] | Freq: Three times a day (TID) | SUBCUTANEOUS | Status: DC
  Administered 2017-05-09: 1 [IU] via SUBCUTANEOUS
  Administered 2017-05-10: 2 [IU] via SUBCUTANEOUS

## 2017-05-08 NOTE — Progress Notes (Addendum)
Pharmacy Antibiotic Note  Steven Logan is a 47 y.o. male admitted on 05/07/2017 with Febrile Neutropenia.  Pharmacy has been consulted for Vancomycin and Zosyn dosing.  Plan: Zosyn 3.375g IV q8h (4 hour infusion).   Vancomycin 2gm iv x1, then 1750mg  iv q36hr  Goal AUC = 400 - 500 for all indications, except meningitis (goal AUC > 500 and Cmin 15-20 mcg/mL)   Height: 6' (182.9 cm) Weight: 218 lb 4.1 oz (99 kg) IBW/kg (Calculated) : 77.6  Temp (24hrs), Avg:100.1 F (37.8 C), Min:99 F (37.2 C), Max:101.2 F (38.4 C)  Recent Labs  Lab 05/07/17 2152 05/07/17 2212  WBC 5.1  --   CREATININE 2.40*  --   LATICACIDVEN  --  1.42    Estimated Creatinine Clearance: 46.4 mL/min (A) (by C-G formula based on SCr of 2.4 mg/dL (H)).    No Known Allergies  Antimicrobials this admission: Vancomycin 05/08/2017 >> Zosyn 05/08/2017 >>   Dose adjustments this admission: -  Microbiology results: -  Thank you for allowing pharmacy to be a part of this patient's care.  Nani Skillern Crowford 05/08/2017 4:42 AM

## 2017-05-08 NOTE — Progress Notes (Signed)
@IPLOG @        PROGRESS NOTE                                                                                                                                                                                                             Patient Demographics:    Steven Logan, is a 47 y.o. male, DOB - [birth year], YQM:[id number]  Admit date - 05/07/2017   Admitting Physician Elwyn Reach, MD  Outpatient Primary MD for the patient is Avva, Steva Ready, MD  LOS - 0  Chief Complaint  Patient presents with  . Fever  . Weakness       Brief Narrative ANDEN Logan is a 47 y.o. male with medical history significant of Renal transplant and DM2 who follows up at [facility] Transplant center who came to the ER with complaint of fever since yesterday and possible cellulitis. He ws evaluated in ER and found to have mild redness and swelling of  Left foot. It does not appear different from precvious. Patient is immunocompromised due to post-transplant medications. ER Physician discussed with Transplant physician at [facility] who suggested admission for evaluation of fever and observation. He has had nausea and one episode of vomiting.     Subjective:    Steven Logan today has, No headache, No chest pain, No abdominal pain - No Nausea, No new weakness tingling or numbness, No Cough - SOB.    Assessment  & Plan :     1.  Left foot cellulitis induced fever.  Good response to empiric vancomycin and Zosyn, cultures pending, currently appears nontoxic, cellulitis clinically appears to have almost resolved, continue empiric antibiotics and follow cultures.  Supportive care till then.  2.  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  Continue transplant medications, creatinine close to baseline.  3.  Severe hypocalcemia.  Replaced IV will monitor.  4.  Hypertension.  Stable on combination of labetalol and Norvasc.  5.  History of right sided AKA.  Supportive care.  Does not have his prosthesis in  the hospital.    Diet : Diet regular Room service appropriate? Yes; Fluid consistency: Thin  Family Communication  :  Father  Code Status :  Full  Disposition Plan  :  Home 1-2 days  Consults  :  None  Procedures  :  None  DVT Prophylaxis  :   Heparin    Lab Results  Component Value Date   PLT 205 05/08/2017    Inpatient Medications  Scheduled Meds: . amLODipine  10 mg Oral QPC breakfast  . heparin  5,000 Units Subcutaneous Q8H  . insulin aspart  0-9 Units Subcutaneous TID WC  . labetalol  100 mg Oral BID  . linagliptin  5 mg Oral Daily  . mycophenolate  360 mg Oral BID  . nitazoxanide  500 mg Oral BID  . tacrolimus  2 mg Oral BID   Continuous Infusions: . sodium chloride    . piperacillin-tazobactam (ZOSYN)  IV 3.375 g (05/08/17 1310)  . [START ON 05/09/2017] vancomycin     PRN Meds:.ondansetron **OR** ondansetron (ZOFRAN) IV  Antibiotics  :    Anti-infectives (From admission, onward)   Start     Dose/Rate Route Frequency Ordered Stop   05/09/17 1000  vancomycin (VANCOCIN) 1,750 mg in sodium chloride 0.9 % 500 mL IVPB     1,750 mg 250 mL/hr over 120 Minutes Intravenous Every 36 hours 05/08/17 0441     05/08/17 1200  piperacillin-tazobactam (ZOSYN) IVPB 3.375 g     3.375 g 12.5 mL/hr over 240 Minutes Intravenous Every 8 hours 05/08/17 1119     05/08/17 1000  nitazoxanide (ALINIA) tablet 500 mg     500 mg Oral 2 times daily 05/08/17 0207 05/09/17 0959   05/08/17 0100  vancomycin (VANCOCIN) 2,000 mg in sodium chloride 0.9 % 500 mL IVPB     2,000 mg 250 mL/hr over 120 Minutes Intravenous  Once 05/08/17 0047 05/08/17 0308   05/07/17 2245  piperacillin-tazobactam (ZOSYN) IVPB 3.375 g     3.375 g 100 mL/hr over 30 Minutes Intravenous  Once 05/07/17 2230 05/07/17 2309         Objective:   Vitals:   05/07/17 2203 05/07/17 2250 05/08/17 0130 05/08/17 0209  BP:  (!) 116/100 139/61 135/75  Pulse:  92 87 87  Resp:  20 14 18   Temp:    99 F (37.2 C)   TempSrc:    Oral  SpO2:  98% 99% 100%  Weight: 98.9 kg (218 lb)   99 kg (218 lb 4.1 oz)  Height: 6' (1.829 m)   6' (1.829 m)    Wt Readings from Last 3 Encounters:  05/08/17 99 kg (218 lb 4.1 oz)  07/12/16 98 kg (216 lb)  05/24/15 98 kg (216 lb)     Intake/Output Summary (Last 24 hours) at 05/08/2017 1354 Last data filed at 05/08/2017 1055 Gross per 24 hour  Intake 2410 ml  Output -  Net 2410 ml     Physical Exam  Awake Alert, Oriented X 3, No new F.N deficits, Normal affect [place].AT,PERRAL Supple Neck,No JVD, No cervical lymphadenopathy appriciated.  Symmetrical Chest wall movement, Good air movement bilaterally, CTAB RRR,No Gallops,Rubs or new Murmurs, No Parasternal Heave +ve B.Sounds, Abd Soft, No tenderness, No organomegaly appriciated, No rebound - guarding or rigidity. Right lower extremity AKA, left lower extremity minimal swelling of the foot, resolved erythema    Data Review:    CBC Recent Labs  Lab 05/07/17 2152 05/08/17 0511  WBC 5.1 4.5  HGB 11.9* 10.4*  HCT 37.4* 33.1*  PLT 249 205  MCV 84.0 86.0  MCH 26.7 27.0  MCHC 31.8 31.4  RDW 14.1 14.4  LYMPHSABS 0.9  --   MONOABS 0.6  --   EOSABS 0.2  --   BASOSABS 0.0  --     Chemistries  Recent Labs  Lab 05/07/17 2152 05/08/17 0511  NA 139 139  K 5.5* 4.3  CL 107 108  CO2 16* 19*  GLUCOSE 108*  108*  BUN 35* 33*  CREATININE 2.40* 2.10*  CALCIUM 6.9* 6.4*  AST 22 14*  ALT 18 15*  ALKPHOS 104 86  BILITOT 1.3* 0.8   ------------------------------------------------------------------------------------------------------------------ No results for input(s): CHOL, HDL, LDLCALC, TRIG, CHOLHDL, LDLDIRECT in the last 72 hours.  Lab Results  Component Value Date   HGBA1C 5.8 (H) 05/08/2017   ------------------------------------------------------------------------------------------------------------------ No results for input(s): TSH, T4TOTAL, T3FREE, THYROIDAB in the last 72 hours.  Invalid  input(s): FREET3 ------------------------------------------------------------------------------------------------------------------ No results for input(s): VITAMINB12, FOLATE, FERRITIN, TIBC, IRON, RETICCTPCT in the last 72 hours.  Coagulation profile No results for input(s): INR, PROTIME in the last 168 hours.  No results for input(s): DDIMER in the last 72 hours.  Cardiac Enzymes No results for input(s): CKMB, TROPONINI, MYOGLOBIN in the last 168 hours.  Invalid input(s): CK ------------------------------------------------------------------------------------------------------------------ No results found for: BNP  Micro Results Recent Results (from the past 240 hour(s))  Blood Culture (routine x 2)     Status: None (Preliminary result)   Collection Time: 05/07/17  9:52 PM  Result Value Ref Range Status   Specimen Description BLOOD LEFT ANTECUBITAL  Final   Special Requests   Final    BOTTLES DRAWN AEROBIC AND ANAEROBIC Blood Culture results may not be optimal due to an inadequate volume of blood received in culture bottles Performed at [facility] [address] [place], [place] [address]    Culture PENDING  Incomplete   Report Status PENDING  Incomplete  Blood Culture (routine x 2)     Status: None (Preliminary result)   Collection Time: 05/07/17  9:57 PM  Result Value Ref Range Status   Specimen Description BLOOD RIGHT HAND  Final   Special Requests   Final    BOTTLES DRAWN AEROBIC AND ANAEROBIC Blood Culture results may not be optimal due to an inadequate volume of blood received in culture bottles Performed at [facility] [address] [place], [place] [address]    Culture PENDING  Incomplete   Report Status PENDING  Incomplete  Culture, blood (x 2)     Status: None (Preliminary result)   Collection Time: 05/08/17  2:58 AM  Result Value Ref Range Status   Specimen Description   Final    BLOOD RIGHT ARM Performed at [facility] [address], [place], [place] [address]    Special Requests   Final    BOTTLES DRAWN AEROBIC AND ANAEROBIC Blood Culture adequate volume Performed at [facility], [place] [address] [place], [place] [address]    Culture PENDING  Incomplete   Report Status PENDING  Incomplete  Culture, blood (x 2)     Status: None (Preliminary result)   Collection Time: 05/08/17  2:58 AM  Result Value Ref Range Status   Specimen Description   Final    BLOOD LEFT ANTECUBITAL Performed at [facility] [address] [place], [place] [address]    Special Requests   Final    BOTTLES DRAWN AEROBIC AND ANAEROBIC Blood Culture adequate volume Performed at [facility], [place] [address] [place], [place] [address]    Culture PENDING  Incomplete   Report Status PENDING  Incomplete    Radiology Reports Dg Chest 2 View  Result Date: 05/07/2017 CLINICAL DATA:  Fever, weakness EXAM: CHEST - 2 VIEW COMPARISON:  05/25/2015 FINDINGS: Lungs are clear.  No pleural effusion or pneumothorax. The heart is normal in size. Degenerative changes of the visualized thoracolumbar  spine. Prominent anterior osteophytosis along the lower thoracic spine. IMPRESSION: No evidence of acute cardiopulmonary disease. Electronically Signed   By: Julian Hy M.D.   On: 05/07/2017 22:30   Dg Foot Complete Left  Result Date: 05/07/2017 CLINICAL DATA:  Fever, left foot swelling, pain at 1st digit EXAM: LEFT FOOT - COMPLETE 3+ VIEW COMPARISON:  None. FINDINGS: Cortical irregularity/destruction involving the proximal aspect of the 5th proximal phalanx. This appearance favors nonunion of a prior fracture, although superimposed infection is possible. No evidence of acute fracture or dislocation. The joint spaces are preserved. Visualized soft tissues are within normal limits. Small plantar calcaneal enthesophyte. IMPRESSION: Cortical irregularity/destruction involving the proximal aspect of the 5th proximal  phalanx, favoring nonunion of a prior fracture, although possibly with superimposed infection. No findings to account for the patients pain at the 1st digit. Electronically Signed   By: Julian Hy M.D.   On: 05/07/2017 22:35    Time Spent in minutes  30   Lala Lund M.D on 05/08/2017 at 1:54 PM  Between 7am to 7pm - Pager - [phone] ( page via [email or web address], text pages only, please mention full 10 digit call back number). After 7pm go to [email or web address] - password [facility]

## 2017-05-08 NOTE — H&P (Signed)
History and Physical    Steven Logan WFU:[id number] DOB: [birth year] DOA: 05/07/2017  Referring MD/NP/PA: Dr Loma Sousa PCP: Prince Solian, MD  Outpatient Specialists: [facility]   Patient coming from: Home  Chief Complaint: Fever  HPI: Steven Logan is a 47 y.o. male with medical history significant of Renal transplant and DM2 who follows up at [facility] center who came to the ER with complaint of fever since yesterday and possible cellulitis. He ws evaluated in ER and found to have mild redness and swelling of  Left foot. It does not appear different from precvious. Patient is immunocompromised due to post-transplant medications. ER Physician discussed with Transplant physician at [facility] who suggested admission for evaluation of fever and observation. He has had nausea and one episode of vomiting.  ED Course: Patient was given Vancomycin and Zosyn and evaluated. Has temperature of 102 orally. No obvious source of fever.  Review of Systems: As per HPI otherwise 10 point review of systems negative.   Past Medical History:  Diagnosis Date  . Abscess    great toe  . Anemia   . Arthritis   . Chronic kidney disease    esrd  . Depression   . Diabetes mellitus   . Dyslipidemia   . Eczema   . Gout   . HTN (hypertension)   . Morbid obesity ([place])   . Pancreatitis   . Pneumonia    2-3 years ago  . Pulmonary embolism ([place])    3  in  lungs  at  one time...  . Renal hypertension    Hemo started  Sept 2014- MWF  . Shortness of breath    ?? chest  cold he has now.  10/8- no SOB    Past Surgical History:  Procedure Laterality Date  . AMPUTATION Right 06/12/2013   Procedure: Transtibial Amputation;  Surgeon: Newt Minion, MD;  Location: [facility];  Service: Orthopedics;  Laterality: Right;  . AMPUTATION Right 06/27/2013   Procedure: AMPUTATION ABOVE KNEE;  Surgeon: Newt Minion, MD;  Location: [facility];  Service: Orthopedics;  Laterality: Right;  Right Above Knee  Amputation  . AV FISTULA PLACEMENT Left 04/07/2012   Procedure: ARTERIOVENOUS (AV) FISTULA CREATION;  Surgeon: Angelia Mould, MD;  Location: [facility];  Service: Vascular;  Laterality: Left;  . FISTULOGRAM Left 08/07/2012   Procedure: FISTULOGRAM;  Surgeon: Angelia Mould, MD;  Location: [facility] CATH LAB;  Service: Cardiovascular;  Laterality: Left;  arm  . I&D EXTREMITY Right 06/02/2013   Procedure: IRRIGATION AND DEBRIDEMENT ARTHROSCOPIC RIGHT ANKLE;  Surgeon: Augustin Schooling, MD;  Location: [facility];  Service: Orthopedics;  Laterality: Right;  . I&D EXTREMITY Right 06/04/2013   Procedure: IRRIGATION AND DEBRIDEMENT RIGHT FOOT/ANKLE;  Surgeon: Newt Minion, MD;  Location: [place] [facility];  Service: Orthopedics;  Laterality: Right;  . I&D EXTREMITY Right 06/06/2013   Procedure: IRRIGATION AND DEBRIDEMENT EXTREMITY;  Surgeon: Newt Minion, MD;  Location: [facility];  Service: Orthopedics;  Laterality: Right;  . I&D EXTREMITY Right 06/24/2013   Procedure: IRRIGATION AND DEBRIDEMENT and Revsion of TRANSTIBIAL AMPUTATION;  Surgeon: Newt Minion, MD;  Location: [facility];  Service: Orthopedics;  Laterality: Right;  . INSERTION OF DIALYSIS CATHETER Right 09/19/2012   Procedure: INSERTION OF DIALYSIS CATHETER;  Surgeon: Angelia Mould, MD;  Location: [facility];  Service: Vascular;  Laterality: Right;  . LIGATION OF COMPETING BRANCHES OF ARTERIOVENOUS FISTULA Left 09/19/2012   Procedure: LIGATION OF COMPETING BRANCHES OF  LEFT ARM ARTERIOVENOUS FISTULA; Vein angioplasty;  Surgeon: Angelia Mould, MD;  Location: [facility];  Service: Vascular;  Laterality: Left;  . none       reports that he has never smoked. He has never used smokeless tobacco. He reports that he has current or past drug history. Drug: Marijuana. He reports that he does not drink alcohol.  No Known Allergies  Family History  Problem Relation Age of Onset  . Stroke Mother   . Diabetes Mother   . Hypertension Mother   . Hypertension Father   .  Cancer Unknown   . Coronary artery disease Unknown   . Colon cancer Maternal Grandmother 17  . Hypertension Brother     Prior to Admission medications   Medication Sig Start Date End Date Taking? Authorizing Provider  ALINIA 500 MG tablet Take 500 mg by mouth 2 (two) times daily. 04/21/17  Yes [provider]  amLODipine (NORVASC) 10 MG tablet Take 10 mg by mouth daily after breakfast.  04/13/17  Yes [provider]  JANUVIA 50 MG tablet Take 50 mg by mouth daily after breakfast.  03/06/17  Yes [provider]  labetalol (NORMODYNE) 200 MG tablet Take 100 mg by mouth 2 (two) times daily.    Yes [provider]  mycophenolate (MYFORTIC) 180 MG EC tablet Take 360 mg by mouth 2 (two) times daily.  05/03/17  Yes [provider]  tacrolimus (PROGRAF) 1 MG capsule Take 2 mg by mouth 2 (two) times daily. 05/03/17  Yes [provider]  calcitRIOL (ROCALTROL) 0.5 MCG capsule Take 2 capsules (1 mcg total) by mouth every Monday, Wednesday, and Friday with hemodialysis. Patient not taking: Reported on 05/08/2017 07/02/13   Bonnielee Haff, MD  docusate sodium (COLACE) 100 MG capsule Take 1 capsule (100 mg total) by mouth every 12 (twelve) hours. Patient not taking: Reported on 05/08/2017 05/25/15   [name], MD  polyethylene glycol (MIRALAX / GLYCOLAX) packet Take 17 g by mouth daily. Patient not taking: Reported on 05/08/2017 05/25/15   [name], MD    Physical Exam: Vitals:   05/07/17 2113 05/07/17 2114 05/07/17 2203 05/07/17 2250  BP:  (!) 142/90  (!) 116/100  Pulse:  (!) 108  92  Resp:    20  Temp: (!) 101.2 F (38.4 C)     TempSrc: Oral     SpO2:  100%  98%  Weight:   98.9 kg (218 lb)   Height:   6' (1.829 m)       Constitutional: NAD, calm, comfortable Vitals:   05/07/17 2113 05/07/17 2114 05/07/17 2203 05/07/17 2250  BP:  (!) 142/90  (!) 116/100  Pulse:  (!) 108  92  Resp:    20  Temp: (!) 101.2 F (38.4 C)      TempSrc: Oral     SpO2:  100%  98%  Weight:   98.9 kg (218 lb)   Height:   6' (1.829 m)    Eyes: PERRL, lids and conjunctivae normal ENMT: Mucous membranes are moist. Posterior pharynx clear of any exudate or lesions.Normal dentition.  Neck: normal, supple, no masses, no thyromegaly Respiratory: clear to auscultation bilaterally, no wheezing, no crackles. Normal respiratory effort. No accessory muscle use.  Cardiovascular: Regular rate and rhythm, no murmurs / rubs / gallops. No extremity edema. 2+ pedal pulses. No carotid bruits.  Abdomen: no tenderness, no masses palpated. No hepatosplenomegaly. Bowel sounds positive.  Musculoskeletal: S/P right AKA,  Skin:  LLE red but not warm or swolen Neurologic: CN 2-12 grossly intact. Sensation intact, DTR normal. Strength 5/5 in all 4.  Psychiatric: Normal judgment and insight. Alert and oriented x 3. Normal mood.   Labs on Admission: I have personally reviewed following labs and imaging studies  CBC: Recent Labs  Lab 05/07/17 2152  WBC 5.1  NEUTROABS 3.4  HGB 11.9*  HCT 37.4*  MCV 84.0  PLT 962   Basic Metabolic Panel: Recent Labs  Lab 05/07/17 2152  NA 139  K 5.5*  CL 107  CO2 16*  GLUCOSE 108*  BUN 35*  CREATININE 2.40*  CALCIUM 6.9*   GFR: Estimated Creatinine Clearance: 46.3 mL/min (A) (by C-G formula based on SCr of 2.4 mg/dL (H)). Liver Function Tests: Recent Labs  Lab 05/07/17 2152  AST 22  ALT 18  ALKPHOS 104  BILITOT 1.3*  PROT 7.8  ALBUMIN 3.9   No results for input(s): LIPASE, AMYLASE in the last 168 hours. No results for input(s): AMMONIA in the last 168 hours. Coagulation Profile: No results for input(s): INR, PROTIME in the last 168 hours. Cardiac Enzymes: No results for input(s): CKTOTAL, CKMB, CKMBINDEX, TROPONINI in the last 168 hours. BNP (last 3 results) No results for input(s): PROBNP in the last 8760 hours. HbA1C: No results for input(s): HGBA1C in the last 72 hours. CBG: No results for  input(s): GLUCAP in the last 168 hours. Lipid Profile: No results for input(s): CHOL, HDL, LDLCALC, TRIG, CHOLHDL, LDLDIRECT in the last 72 hours. Thyroid Function Tests: No results for input(s): TSH, T4TOTAL, FREET4, T3FREE, THYROIDAB in the last 72 hours. Anemia Panel: No results for input(s): VITAMINB12, FOLATE, FERRITIN, TIBC, IRON, RETICCTPCT in the last 72 hours. Urine analysis:    Component Value Date/Time   COLORURINE YELLOW 05/07/2017 2152   APPEARANCEUR CLEAR 05/07/2017 2152   LABSPEC 1.018 05/07/2017 2152   PHURINE 6.0 05/07/2017 2152   GLUCOSEU NEGATIVE 05/07/2017 2152   HGBUR SMALL (A) 05/07/2017 2152   BILIRUBINUR NEGATIVE 05/07/2017 2152   KETONESUR 5 (A) 05/07/2017 2152   PROTEINUR 30 (A) 05/07/2017 2152   UROBILINOGEN 1.0 05/29/2013 1647   NITRITE NEGATIVE 05/07/2017 2152   LEUKOCYTESUR NEGATIVE 05/07/2017 2152   Sepsis Labs: @LABRCNTIP (procalcitonin:4,lacticidven:4) )No results found for this or any previous visit (from the past 240 hour(s)).   Radiological Exams on Admission: Dg Chest 2 View  Result Date: 05/07/2017 CLINICAL DATA:  Fever, weakness EXAM: CHEST - 2 VIEW COMPARISON:  05/25/2015 FINDINGS: Lungs are clear.  No pleural effusion or pneumothorax. The heart is normal in size. Degenerative changes of the visualized thoracolumbar spine. Prominent anterior osteophytosis along the lower thoracic spine. IMPRESSION: No evidence of acute cardiopulmonary disease. Electronically Signed   By: Julian Hy M.D.   On: 05/07/2017 22:30   Dg Foot Complete Left  Result Date: 05/07/2017 CLINICAL DATA:  Fever, left foot swelling, pain at 1st digit EXAM: LEFT FOOT - COMPLETE 3+ VIEW COMPARISON:  None. FINDINGS: Cortical irregularity/destruction involving the proximal aspect of the 5th proximal phalanx. This appearance favors nonunion of a prior fracture, although superimposed infection is possible. No evidence of acute fracture or dislocation. The joint spaces are  preserved. Visualized soft tissues are within normal limits. Small plantar calcaneal enthesophyte. IMPRESSION: Cortical irregularity/destruction involving the proximal aspect of the 5th proximal phalanx, favoring nonunion of a prior fracture, although possibly with superimposed infection. No findings to account for the patients pain at the 1st digit. Electronically Signed   By: Henderson Newcomer.D.  On: 05/07/2017 22:35    Assessment/Plan Principal Problem:   Fever in adult Active Problems:   HTN (hypertension)   Hyperlipidemia   Hyperkalemia   Anemia in chronic kidney disease   History of right above knee amputation (HCC)   Febrile illness, acute    #1 Febrile Illness: In immunocompromised individual. Patient will be admitted for observation and work up. Blood culture will be obtained and empiric antibiotics with Zosyn and Vancomycin started. Has had recent Cryptococcal infection. Will get records from [facility]. May get ID consult.  #2. S/P renal transplant. Has CKD IV. Monitir renal function  #3 Hyperkalemia: Re-check if potassium is high will consider Kayexalate  #4 HTN: continue home medications  #5 DM2: continue home regimen with SSI   DVT prophylaxis: Heparin  Code Status: Full Family Communication: None available Disposition Plan: Home  Consults called: None  Admission status: inpatient  Severity of Illness: The appropriate patient status for this patient is INPATIENT. Inpatient status is judged to be reasonable and necessary in order to provide the required intensity of service to ensure the patient's safety. [name] presenting symptoms, physical exam findings, and initial radiographic and laboratory data in the context of their chronic comorbidities is felt to place them at high risk for further clinical deterioration. Furthermore, it is not anticipated that the patient will be medically stable for discharge from the hospital within 2 midnights of admission. The  following factors support the patient status of inpatient.   " [name] presenting symptoms include fever. " The worrisome physical exam findings include Temp of 101.2. " The initial radiographic and laboratory data are worrisome because of Hyperkalemia. " The chronic co-morbidities include Hx of renal transplant.   * I certify that at the point of admission it is my clinical judgment that the patient will require inpatient hospital care spanning beyond 2 midnights from the point of admission due to high intensity of service, high risk for further deterioration and high frequency of surveillance required.Barbette Merino MD [facility] Pager [phone]  If 7PM-7AM, please contact night-coverage [email or web address] Password [email or web address]  05/08/2017, 1:07 AM

## 2017-05-08 NOTE — Progress Notes (Signed)
Received messaged from co-worker that lab had called a "critical lab value on my new admission patient." Results were Calcium of 6.4. Lab results were then paged out to X. Blount, Mid-level provider at [phone]. Awaiting further instructions.

## 2017-05-08 NOTE — Progress Notes (Signed)
A consult was received from an ED physician for vancomycin per pharmacy dosing.  [name] profile has been reviewed for ht/wt/allergies/indication/available labs.   A one time order has been placed for Vancomycin 2gm iv x1.  Further antibiotics/pharmacy consults should be ordered by admitting physician if indicated.                       Thank you, Nani Skillern Crowford 05/08/2017  12:48 AM

## 2017-05-09 LAB — COMPREHENSIVE METABOLIC PANEL
ALBUMIN: 3.1 g/dL — AB (ref 3.5–5.0)
ALT: 14 U/L — AB (ref 17–63)
AST: 15 U/L (ref 15–41)
Alkaline Phosphatase: 81 U/L (ref 38–126)
Anion gap: 8 (ref 5–15)
BUN: 23 mg/dL — AB (ref 6–20)
CHLORIDE: 111 mmol/L (ref 101–111)
CO2: 19 mmol/L — ABNORMAL LOW (ref 22–32)
CREATININE: 1.82 mg/dL — AB (ref 0.61–1.24)
Calcium: 7.1 mg/dL — ABNORMAL LOW (ref 8.9–10.3)
GFR calc Af Amer: 49 mL/min — ABNORMAL LOW (ref >60)
GFR calc non Af Amer: 43 mL/min — ABNORMAL LOW (ref >60)
GLUCOSE: 117 mg/dL — AB (ref 65–99)
POTASSIUM: 4.4 mmol/L (ref 3.5–5.1)
Sodium: 138 mmol/L (ref 135–145)
Total Bilirubin: 0.4 mg/dL (ref 0.3–1.2)
Total Protein: 6.6 g/dL (ref 6.5–8.1)

## 2017-05-09 LAB — GLUCOSE, CAPILLARY
GLUCOSE-CAPILLARY: 113 mg/dL — AB (ref 65–99)
GLUCOSE-CAPILLARY: 146 mg/dL — AB (ref 65–99)
GLUCOSE-CAPILLARY: 158 mg/dL — AB (ref 65–99)
Glucose-Capillary: 122 mg/dL — ABNORMAL HIGH (ref 65–99)

## 2017-05-09 LAB — MAGNESIUM: MAGNESIUM: 1.1 mg/dL — AB (ref 1.7–2.4)

## 2017-05-09 MED ORDER — SODIUM CHLORIDE 0.9 % IV SOLN
2.0000 g | Freq: Once | INTRAVENOUS | Status: AC
  Administered 2017-05-09: 2 g via INTRAVENOUS
  Filled 2017-05-09: qty 20

## 2017-05-09 MED ORDER — ACETAMINOPHEN 80 MG PO CHEW
80.0000 mg | CHEWABLE_TABLET | Freq: Four times a day (QID) | ORAL | Status: DC | PRN
Start: 2017-05-09 — End: 2017-05-10
  Administered 2017-05-09: 80 mg via ORAL
  Filled 2017-05-09: qty 1

## 2017-05-09 MED ORDER — VANCOMYCIN HCL 10 G IV SOLR
1500.0000 mg | INTRAVENOUS | Status: DC
  Filled 2017-05-09: qty 1500

## 2017-05-09 MED ORDER — SODIUM CHLORIDE 0.9 % IV SOLN: 2.0000 g | Freq: Once | INTRAVENOUS | Status: DC

## 2017-05-09 MED ORDER — MAGNESIUM SULFATE 4 GM/100ML IV SOLN
4.0000 g | Freq: Once | INTRAVENOUS | Status: AC
  Administered 2017-05-09: 4 g via INTRAVENOUS
  Filled 2017-05-09: qty 100

## 2017-05-09 MED ORDER — VANCOMYCIN HCL 10 G IV SOLR: 1500.0000 mg | INTRAVENOUS | Status: DC

## 2017-05-09 NOTE — Progress Notes (Addendum)
Pharmacy Antibiotic Note  Steven Logan is a 47 y.o. male with PMH of renal transplant and DM type 2 admitted on 05/07/2017 with left foot cellulitis.  Pharmacy has been consulted for Vancomycin and Zosyn dosing.  Plan: Continue Zosyn 3.375g IV q8h (4 hour infusion).  Adjust Vancomycin to 1500mg  IV q24h due to improved SCr (now ~ baseline).  Monitor renal function, cultures, clinical course. Noted MD's plan to transition to PO antibiotics tomorrow if cultures remain negative.   Height: 6' (182.9 cm) Weight: 218 lb 4.1 oz (99 kg) IBW/kg (Calculated) : 77.6  Temp (24hrs), Avg:98.1 F (36.7 C), Min:97.9 F (36.6 C), Max:98.3 F (36.8 C)  Recent Labs  Lab 05/07/17 2152 05/07/17 2212 05/08/17 0511 05/09/17 0551  WBC 5.1  --  4.5  --   CREATININE 2.40*  --  2.10* 1.82*  LATICACIDVEN  --  1.42  --   --     Estimated Creatinine Clearance: 61.2 mL/min (A) (by C-G formula based on SCr of 1.82 mg/dL (H)).    No Known Allergies  Antimicrobials this admission: 4/13 Zosyn >> 4/14 Vancomycin >>  Microbiology results: 4/13 BCx: IP 4/14 BCx: IP  Thank you for allowing pharmacy to be a part of this patient's care.   Lindell Spar, PharmD, BCPS Pager: [phone] 05/09/2017 2:44 PM

## 2017-05-09 NOTE — Progress Notes (Signed)
During attempt for IV, pt refused to continue and requested needle be pulled out. Pt refused continued attempts at a different location.

## 2017-05-09 NOTE — Progress Notes (Signed)
@IPLOG @        PROGRESS NOTE                                                                                                                                                                                                             Patient Demographics:    Steven Logan, is a 47 y.o. male, DOB - Jan 28, 1970, DUK:[id number]  Admit date - 05/07/2017   Admitting Physician Elwyn Reach, MD  Outpatient Primary MD for the patient is Avva, Steva Ready, MD  LOS - 1  Chief Complaint  Patient presents with  . Fever  . Weakness       Brief Narrative Steven Logan is a 47 y.o. male with medical history significant of Renal transplant and DM2 who follows up at [facility] who came to the ER with complaint of fever since yesterday and possible cellulitis. He ws evaluated in ER and found to have mild redness and swelling of  Left foot. It does not appear different from precvious. Patient is immunocompromised due to post-transplant medications. ER Physician discussed with Transplant physician at [facility] who suggested admission for evaluation of fever and observation. He has had nausea and one episode of vomiting.     Subjective:   Patient in bed, appears comfortable, denies any headache, no fever, no chest pain or pressure, no shortness of breath , no abdominal pain. No focal weakness.   Assessment  & Plan :     1.  Left foot cellulitis induced fever.  Good response to empiric vancomycin and Zosyn, cultures pending, clinically sepsis and cellulitis resolved.  Follow final cultures tomorrow if remain negative transition to oral antibiotics and discharge.  2.  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  Continue transplant medications, creatinine close to baseline.  3.  Severe hypocalcemia and hypomagnesemia.  Replaced IV and monitor tomorrow.  4.  Hypertension.  Stable on combination of labetalol and Norvasc.  5.  History of right sided AKA.  Supportive care.  Does not  have his prosthesis in the hospital.    Diet : Diet regular Room service appropriate? Yes; Fluid consistency: Thin  Family Communication  :  Father  Code Status :  Full  Disposition Plan  :  Home 1-2 days  Consults  :  None  Procedures  :  None  DVT Prophylaxis  :   Heparin    Lab Results  Component Value Date   PLT 205 05/08/2017    Inpatient Medications  Scheduled Meds: . amLODipine  10 mg Oral QPC breakfast  . heparin  5,000 Units Subcutaneous Q8H  . insulin aspart  0-9 Units Subcutaneous TID WC  . labetalol  100 mg Oral BID  . linagliptin  5 mg Oral Daily  . mycophenolate  360 mg Oral BID  . tacrolimus  2 mg Oral BID   Continuous Infusions: . calcium gluconate    . magnesium sulfate 1 - 4 g bolus IVPB 4 g (05/09/17 0840)  . piperacillin-tazobactam (ZOSYN)  IV Stopped (05/09/17 0803)  . vancomycin 1,750 mg (05/09/17 0959)   PRN Meds:.ondansetron **OR** ondansetron (ZOFRAN) IV  Antibiotics  :    Anti-infectives (From admission, onward)   Start     Dose/Rate Route Frequency Ordered Stop   05/09/17 1000  vancomycin (VANCOCIN) 1,750 mg in sodium chloride 0.9 % 500 mL IVPB     1,750 mg 250 mL/hr over 120 Minutes Intravenous Every 36 hours 05/08/17 0441     05/08/17 1200  piperacillin-tazobactam (ZOSYN) IVPB 3.375 g     3.375 g 12.5 mL/hr over 240 Minutes Intravenous Every 8 hours 05/08/17 1119     05/08/17 1000  nitazoxanide (ALINIA) tablet 500 mg     500 mg Oral 2 times daily 05/08/17 0207 05/09/17 0959   05/08/17 0100  vancomycin (VANCOCIN) 2,000 mg in sodium chloride 0.9 % 500 mL IVPB     2,000 mg 250 mL/hr over 120 Minutes Intravenous  Once 05/08/17 0047 05/08/17 0308   05/07/17 2245  piperacillin-tazobactam (ZOSYN) IVPB 3.375 g     3.375 g 100 mL/hr over 30 Minutes Intravenous  Once 05/07/17 2230 05/07/17 2309         Objective:   Vitals:   05/08/17 1359 05/08/17 1425 05/08/17 2106 05/09/17 0545  BP: 121/82 114/81 121/70 127/72  Pulse: 76 78 78  82  Resp: 17 (!) 22 17 17   Temp: 98.4 F (36.9 C) 98.4 F (36.9 C) 98.1 F (36.7 C) 98.3 F (36.8 C)  TempSrc: Oral Oral Oral Oral  SpO2: 100% 100% 100% 100%  Weight:      Height:        Wt Readings from Last 3 Encounters:  05/08/17 99 kg (218 lb 4.1 oz)  07/12/16 98 kg (216 lb)  05/24/15 98 kg (216 lb)     Intake/Output Summary (Last 24 hours) at 05/09/2017 1035 Last data filed at 05/09/2017 0600 Gross per 24 hour  Intake 1350 ml  Output 1400 ml  Net -50 ml     Physical Exam  Awake Alert, Oriented X 3, No new F.N deficits, Normal affect [place].AT,PERRAL Supple Neck,No JVD, No cervical lymphadenopathy appriciated.  Symmetrical Chest wall movement, Good air movement bilaterally, CTAB RRR,No Gallops, Rubs or new Murmurs, No Parasternal Heave +ve B.Sounds, Abd Soft, No tenderness, No organomegaly appriciated, No rebound - guarding or rigidity. Right lower extremity AKA, left lower extremity foot has almost completely resolved    Data Review:    CBC Recent Labs  Lab 05/07/17 2152 05/08/17 0511  WBC 5.1 4.5  HGB 11.9* 10.4*  HCT 37.4* 33.1*  PLT 249 205  MCV 84.0 86.0  MCH 26.7 27.0  MCHC 31.8 31.4  RDW 14.1 14.4  LYMPHSABS 0.9  --   MONOABS 0.6  --   EOSABS 0.2  --   BASOSABS 0.0  --     Chemistries  Recent Labs  Lab 05/07/17 2152 05/08/17 0511 05/09/17 0551  NA 139 139 138  K 5.5* 4.3 4.4  CL 107 108 111  CO2 16* 19* 19*  GLUCOSE 108* 108* 117*  BUN 35* 33* 23*  CREATININE 2.40* 2.10* 1.82*  CALCIUM 6.9* 6.4* 7.1*  MG  --   --  1.1*  AST 22 14* 15  ALT 18 15* 14*  ALKPHOS 104 86 81  BILITOT 1.3* 0.8 0.4   ------------------------------------------------------------------------------------------------------------------ No results for input(s): CHOL, HDL, LDLCALC, TRIG, CHOLHDL, LDLDIRECT in the last 72 hours.  Lab Results  Component Value Date   HGBA1C 5.8 (H) 05/08/2017    ------------------------------------------------------------------------------------------------------------------ No results for input(s): TSH, T4TOTAL, T3FREE, THYROIDAB in the last 72 hours.  Invalid input(s): FREET3 ------------------------------------------------------------------------------------------------------------------ No results for input(s): VITAMINB12, FOLATE, FERRITIN, TIBC, IRON, RETICCTPCT in the last 72 hours.  Coagulation profile No results for input(s): INR, PROTIME in the last 168 hours.  No results for input(s): DDIMER in the last 72 hours.  Cardiac Enzymes No results for input(s): CKMB, TROPONINI, MYOGLOBIN in the last 168 hours.  Invalid input(s): CK ------------------------------------------------------------------------------------------------------------------ No results found for: BNP  Micro Results Recent Results (from the past 240 hour(s))  Blood Culture (routine x 2)     Status: None (Preliminary result)   Collection Time: 05/07/17  9:52 PM  Result Value Ref Range Status   Specimen Description BLOOD LEFT ANTECUBITAL  Final   Special Requests   Final    BOTTLES DRAWN AEROBIC AND ANAEROBIC Blood Culture results may not be optimal due to an inadequate volume of blood received in culture bottles Performed at [facility] [address] [place], [place] [address]    Culture PENDING  Incomplete   Report Status PENDING  Incomplete  Blood Culture (routine x 2)     Status: None (Preliminary result)   Collection Time: 05/07/17  9:57 PM  Result Value Ref Range Status   Specimen Description BLOOD RIGHT HAND  Final   Special Requests   Final    BOTTLES DRAWN AEROBIC AND ANAEROBIC Blood Culture results may not be optimal due to an inadequate volume of blood received in culture bottles Performed at [facility] [address] [place], [place] [address]    Culture PENDING  Incomplete   Report Status PENDING  Incomplete  Culture, blood (x  2)     Status: None (Preliminary result)   Collection Time: 05/08/17  2:58 AM  Result Value Ref Range Status   Specimen Description   Final    BLOOD RIGHT ARM Performed at [facility] [address] [place], [place] [address]    Special Requests   Final    BOTTLES DRAWN AEROBIC AND ANAEROBIC Blood Culture adequate volume Performed at [facility], [place] [address], [place], [place] [address]    Culture PENDING  Incomplete   Report Status PENDING  Incomplete  Culture, blood (x 2)     Status: None (Preliminary result)   Collection Time: 05/08/17  2:58 AM  Result Value Ref Range Status   Specimen Description   Final    BLOOD LEFT ANTECUBITAL Performed at [facility] [address] [place], [place] [address]    Special Requests   Final    BOTTLES DRAWN AEROBIC AND ANAEROBIC Blood Culture adequate volume Performed at [facility], [place] [address], [place], [place] [address]    Culture PENDING  Incomplete   Report Status PENDING  Incomplete    Radiology Reports Dg Chest 2 View  Result Date: 05/07/2017 CLINICAL DATA:  Fever, weakness EXAM: CHEST - 2 VIEW COMPARISON:  05/25/2015 FINDINGS: Lungs are clear.  No pleural effusion or pneumothorax. The heart is normal in size. Degenerative changes of the visualized thoracolumbar spine. Prominent anterior osteophytosis along the lower thoracic spine. IMPRESSION: No evidence of acute cardiopulmonary disease. Electronically Signed   By: Julian Hy M.D.   On: 05/07/2017 22:30   Dg Foot Complete Left  Result Date: 05/07/2017 CLINICAL DATA:  Fever, left foot swelling, pain at 1st digit EXAM: LEFT FOOT - COMPLETE 3+ VIEW COMPARISON:  None. FINDINGS: Cortical irregularity/destruction involving the proximal aspect of the 5th proximal phalanx. This appearance favors nonunion of a prior fracture, although superimposed infection is possible. No evidence of acute fracture or dislocation. The joint  spaces are preserved. Visualized soft tissues are within normal limits. Small plantar calcaneal enthesophyte. IMPRESSION: Cortical irregularity/destruction involving the proximal aspect of the 5th proximal phalanx, favoring nonunion of a prior fracture, although possibly with superimposed infection. No findings to account for the patients pain at the 1st digit. Electronically Signed   By: Julian Hy M.D.   On: 05/07/2017 22:35    Time Spent in minutes  30   Lala Lund M.D on 05/09/2017 at 10:35 AM  Between 7am to 7pm - Pager - [phone] ( page via [email or web address], text pages only, please mention full 10 digit call back number). After 7pm go to [email or web address] - password [facility]

## 2017-05-10 LAB — COMPREHENSIVE METABOLIC PANEL
ALT: 16 U/L — AB (ref 17–63)
AST: 13 U/L — AB (ref 15–41)
Albumin: 3.1 g/dL — ABNORMAL LOW (ref 3.5–5.0)
Alkaline Phosphatase: 78 U/L (ref 38–126)
Anion gap: 9 (ref 5–15)
BILIRUBIN TOTAL: 0.2 mg/dL — AB (ref 0.3–1.2)
BUN: 18 mg/dL (ref 6–20)
CO2: 18 mmol/L — ABNORMAL LOW (ref 22–32)
CREATININE: 1.61 mg/dL — AB (ref 0.61–1.24)
Calcium: 8 mg/dL — ABNORMAL LOW (ref 8.9–10.3)
Chloride: 111 mmol/L (ref 101–111)
GFR calc Af Amer: 57 mL/min — ABNORMAL LOW (ref >60)
GFR, EST NON AFRICAN AMERICAN: 49 mL/min — AB (ref >60)
Glucose, Bld: 144 mg/dL — ABNORMAL HIGH (ref 65–99)
POTASSIUM: 4.3 mmol/L (ref 3.5–5.1)
Sodium: 138 mmol/L (ref 135–145)
TOTAL PROTEIN: 6.6 g/dL (ref 6.5–8.1)

## 2017-05-10 LAB — MAGNESIUM: MAGNESIUM: 1.6 mg/dL — AB (ref 1.7–2.4)

## 2017-05-10 LAB — GLUCOSE, CAPILLARY: Glucose-Capillary: 154 mg/dL — ABNORMAL HIGH (ref 65–99)

## 2017-05-10 LAB — CALCIUM, IONIZED
Calcium, Ionized, Serum: 3.6 mg/dL — ABNORMAL LOW (ref 4.5–5.6)
Calcium, Ionized, Serum: 4 mg/dL — ABNORMAL LOW (ref 4.5–5.6)

## 2017-05-10 MED ORDER — MAGNESIUM SULFATE 2 GM/50ML IV SOLN
2.0000 g | Freq: Once | INTRAVENOUS | Status: AC
  Administered 2017-05-10: 2 g via INTRAVENOUS
  Filled 2017-05-10: qty 50

## 2017-05-10 MED ORDER — DOXYCYCLINE HYCLATE 100 MG PO CAPS: 100.0000 mg | ORAL_CAPSULE | Freq: Two times a day (BID) | ORAL | 0 refills | Status: DC

## 2017-05-10 MED ORDER — SODIUM CHLORIDE 0.9 % IV SOLN
1.0000 g | Freq: Once | INTRAVENOUS | Status: AC
  Administered 2017-05-10: 1 g via INTRAVENOUS
  Filled 2017-05-10: qty 1.1

## 2017-05-10 NOTE — Discharge Instructions (Signed)
Follow with Primary MD Prince Solian, MD and your nephrologist in 3 days   Get CBC, CMP, Magnesium checked  by Primary MD in 3  days   Activity: As tolerated with Full fall precautions use walker/cane & assistance as needed  Disposition Home    Diet:   Heart Healthy    For Heart failure patients - Check your Weight same time everyday, if you gain over 2 pounds, or you develop in leg swelling, experience more shortness of breath or chest pain, call your Primary MD immediately. Follow Cardiac Low Salt Diet and 1.5 lit/day fluid restriction.  Special Instructions: If you have smoked or chewed Tobacco  in the last 2 yrs please stop smoking, stop any regular Alcohol  and or any Recreational drug use.  On your next visit with your primary care physician please Get Medicines reviewed and adjusted.  Please request your Prim.MD to go over all Hospital Tests and Procedure/Radiological results at the follow up, please get all Hospital records sent to your Prim MD by signing hospital release before you go home.  If you experience worsening of your admission symptoms, develop shortness of breath, life threatening emergency, suicidal or homicidal thoughts you must seek medical attention immediately by calling 911 or calling your MD immediately  if symptoms less severe.  You Must read complete instructions/literature along with all the possible adverse reactions/side effects for all the Medicines you take and that have been prescribed to you. Take any new Medicines after you have completely understood and accpet all the possible adverse reactions/side effects.

## 2017-05-10 NOTE — Discharge Summary (Signed)
Steven Logan TIW:[id number] DOB: 1970-03-29 DOA: 05/07/2017  PCP: Prince Solian, MD  Admit date: 05/07/2017  Discharge date: 05/10/2017  Admitted From: Home   Disposition:  Home   Recommendations for Outpatient Follow-up:   Follow up with PCP in 1-2 weeks  PCP Please obtain BMP/CBC, 2 view CXR in 1week,  (see Discharge instructions)   PCP Please follow up on the following pending results: Final culture results   Home Health: None   Equipment/Devices: None  Consultations: None Discharge Condition: Full   CODE STATUS: Stable   Diet Recommendation:  Heart Healthy    Chief Complaint  Patient presents with  . Fever  . Weakness     Brief history of present illness from the day of admission and additional interim summary    Steven Logan a 47 y.o.malewith medical history significant ofRenal transplant and DM2 who follows up at [facility] center who came to the ER with complaint of fever since yesterday and possible cellulitis. He ws evaluated in ER and found to have mild redness and swelling of Left foot. It does not appear different from precvious. Patient is immunocompromised due to post-transplant medications. ER Physician discussed with Transplant physician at [facility] who suggested admission for evaluation of fever and observation. He has had nausea and one episode of vomiting.                                                                  Hospital Course    1.  Left foot cellulitis induced fever.  Good response to empiric vancomycin and Zosyn, all cultures negative and now sepsis and cellulitis have clinically resolved, he is back to baseline appears nontoxic will be placed on doxycycline for another week and discharged home with close outpatient PCP and primary nephrologist  follow-up.  2.  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  Continue transplant medications, creatinine close to baseline.  3.  Severe hypocalcemia and hypomagnesemia.  Replaced IV quested to follow with PCP and nephrologist within 3-5 days to get repeat levels checked.  4.  Hypertension.  Stable on combination of labetalol and Norvasc.  5.  History of right sided AKA.  Supportive care.  Does not have his prosthesis in the hospital.   Discharge diagnosis     Principal Problem:   Fever in adult Active Problems:   HTN (hypertension)   Hyperlipidemia   Hyperkalemia   Anemia in chronic kidney disease   History of right above knee amputation (HCC)   Febrile illness, acute    Discharge instructions    Discharge Instructions    Diet - low sodium heart healthy   Complete by:  As directed    Discharge instructions   Complete by:  As directed    Follow with Primary MD Avva,  Ravisankar, MD and your nephrologist in 3 days   Get CBC, CMP, Magnesium checked  by Primary MD in 3  days   Activity: As tolerated with Full fall precautions use walker/cane & assistance as needed  Disposition Home    Diet:   Heart Healthy    For Heart failure patients - Check your Weight same time everyday, if you gain over 2 pounds, or you develop in leg swelling, experience more shortness of breath or chest pain, call your Primary MD immediately. Follow Cardiac Low Salt Diet and 1.5 lit/day fluid restriction.  Special Instructions: If you have smoked or chewed Tobacco  in the last 2 yrs please stop smoking, stop any regular Alcohol  and or any Recreational drug use.  On your next visit with your primary care physician please Get Medicines reviewed and adjusted.  Please request your Prim.MD to go over all Hospital Tests and Procedure/Radiological results at the follow up, please get all Hospital records sent to your Prim MD by signing hospital release before you go home.  If you experience worsening  of your admission symptoms, develop shortness of breath, life threatening emergency, suicidal or homicidal thoughts you must seek medical attention immediately by calling 911 or calling your MD immediately  if symptoms less severe.  You Must read complete instructions/literature along with all the possible adverse reactions/side effects for all the Medicines you take and that have been prescribed to you. Take any new Medicines after you have completely understood and accpet all the possible adverse reactions/side effects.   Increase activity slowly   Complete by:  As directed           Discharge Medications   Allergies as of 05/10/2017   No Known Allergies     Medication List    TAKE these medications   ALINIA 500 MG tablet Generic drug:  nitazoxanide Take 500 mg by mouth 2 (two) times daily.   amLODipine 10 MG tablet Commonly known as:  NORVASC Take 10 mg by mouth daily after breakfast.   calcitRIOL 0.5 MCG capsule Commonly known as:  ROCALTROL Take 2 capsules (1 mcg total) by mouth every Monday, Wednesday, and Friday with hemodialysis.   docusate sodium 100 MG capsule Commonly known as:  COLACE Take 1 capsule (100 mg total) by mouth every 12 (twelve) hours.   doxycycline 100 MG capsule Commonly known as:  VIBRAMYCIN Take 1 capsule (100 mg total) by mouth 2 (two) times daily.   JANUVIA 50 MG tablet Generic drug:  sitaGLIPtin Take 50 mg by mouth daily after breakfast.   labetalol 200 MG tablet Commonly known as:  NORMODYNE Take 100 mg by mouth 2 (two) times daily.   mycophenolate 180 MG EC tablet Commonly known as:  MYFORTIC Take 360 mg by mouth 2 (two) times daily.   polyethylene glycol packet Commonly known as:  MIRALAX / GLYCOLAX Take 17 g by mouth daily.   tacrolimus 1 MG capsule Commonly known as:  PROGRAF Take 2 mg by mouth 2 (two) times daily.       Follow-up Information    Avva, Ravisankar, MD. Schedule an appointment as soon as possible for a  visit in 3 day(s).   Specialty:  Internal Medicine Why:  And your nephrologist in 3-5 days, get CBC, CMP and magnesium level checked Contact information: [address] [place] [address] [phone]           Major procedures and Radiology Reports - PLEASE review detailed and final reports thoroughly  -  Dg Chest 2 View  Result Date: 05/07/2017 CLINICAL DATA:  Fever, weakness EXAM: CHEST - 2 VIEW COMPARISON:  05/25/2015 FINDINGS: Lungs are clear.  No pleural effusion or pneumothorax. The heart is normal in size. Degenerative changes of the visualized thoracolumbar spine. Prominent anterior osteophytosis along the lower thoracic spine. IMPRESSION: No evidence of acute cardiopulmonary disease. Electronically Signed   By: Julian Hy M.D.   On: 05/07/2017 22:30   Dg Foot Complete Left  Result Date: 05/07/2017 CLINICAL DATA:  Fever, left foot swelling, pain at 1st digit EXAM: LEFT FOOT - COMPLETE 3+ VIEW COMPARISON:  None. FINDINGS: Cortical irregularity/destruction involving the proximal aspect of the 5th proximal phalanx. This appearance favors nonunion of a prior fracture, although superimposed infection is possible. No evidence of acute fracture or dislocation. The joint spaces are preserved. Visualized soft tissues are within normal limits. Small plantar calcaneal enthesophyte. IMPRESSION: Cortical irregularity/destruction involving the proximal aspect of the 5th proximal phalanx, favoring nonunion of a prior fracture, although possibly with superimposed infection. No findings to account for the patients pain at the 1st digit. Electronically Signed   By: Julian Hy M.D.   On: 05/07/2017 22:35    Micro Results     Recent Results (from the past 240 hour(s))  Blood Culture (routine x 2)     Status: None (Preliminary result)   Collection Time: 05/07/17  9:52 PM  Result Value Ref Range Status   Specimen Description BLOOD LEFT ANTECUBITAL  Final   Special  Requests   Final    BOTTLES DRAWN AEROBIC AND ANAEROBIC Blood Culture results may not be optimal due to an inadequate volume of blood received in culture bottles   Culture   Final    NO GROWTH 1 DAY Performed at [place] [address], [place], [place] [address]    Report Status PENDING  Incomplete  Blood Culture (routine x 2)     Status: None (Preliminary result)   Collection Time: 05/07/17  9:57 PM  Result Value Ref Range Status   Specimen Description BLOOD RIGHT HAND  Final   Special Requests   Final    BOTTLES DRAWN AEROBIC AND ANAEROBIC Blood Culture results may not be optimal due to an inadequate volume of blood received in culture bottles   Culture   Final    NO GROWTH 1 DAY Performed at [facility], [place] [address], [place], [place] [address]    Report Status PENDING  Incomplete  Culture, blood (x 2)     Status: None (Preliminary result)   Collection Time: 05/08/17  2:58 AM  Result Value Ref Range Status   Specimen Description   Final    BLOOD RIGHT ARM Performed at [place] [address] [place], [place] [address]    Special Requests   Final    BOTTLES DRAWN AEROBIC AND ANAEROBIC Blood Culture adequate volume   Culture   Final    NO GROWTH 1 DAY Performed at [facility], [place] [address] [place], [place] [address]    Report Status PENDING  Incomplete  Culture, blood (x 2)     Status: None (Preliminary result)   Collection Time: 05/08/17  2:58 AM  Result Value Ref Range Status   Specimen Description   Final    BLOOD LEFT ANTECUBITAL Performed at [place] [address] [place], [place] [address]    Special Requests   Final    BOTTLES DRAWN AEROBIC  AND ANAEROBIC Blood Culture adequate volume   Culture   Final    NO GROWTH 1 DAY Performed at [facility], [place] [address] [place], [place] [address]    Report Status PENDING  Incomplete    Today   Subjective    Marquette Blodgett  today has no headache,no chest abdominal pain,no new weakness tingling or numbness, feels much better wants to go home today.    Objective   Blood pressure 114/75, pulse 92, temperature 98.8 F (37.1 C), temperature source Oral, resp. rate 18, height 6' (1.829 m), weight 99 kg (218 lb 4.1 oz), SpO2 99 %.   Intake/Output Summary (Last 24 hours) at 05/10/2017 1013 Last data filed at 05/10/2017 0904 Gross per 24 hour  Intake 1150 ml  Output 300 ml  Net 850 ml    Exam  Awake Alert, Oriented X 3, No new F.N deficits, Normal affect [place].AT,PERRAL Supple Neck,No JVD, No cervical lymphadenopathy appriciated.  Symmetrical Chest wall movement, Good air movement bilaterally, CTAB RRR,No Gallops, Rubs or new Murmurs, No Parasternal Heave +ve B.Sounds, Abd Soft, No tenderness, No organomegaly appriciated, No rebound - guarding or rigidity. Right lower extremity AKA, left lower extremity foot has almost completely resolved    Data Review   CBC w Diff:  Lab Results  Component Value Date   WBC 4.5 05/08/2017   HGB 10.4 (L) 05/08/2017   HCT 33.1 (L) 05/08/2017   PLT 205 05/08/2017   LYMPHOPCT 17 05/07/2017   MONOPCT 12 05/07/2017   EOSPCT 4 05/07/2017   BASOPCT 0 05/07/2017    CMP:  Lab Results  Component Value Date   NA 138 05/10/2017   K 4.3 05/10/2017   CL 111 05/10/2017   CO2 18 (L) 05/10/2017   BUN 18 05/10/2017   CREATININE 1.61 (H) 05/10/2017   PROT 6.6 05/10/2017   ALBUMIN 3.1 (L) 05/10/2017   BILITOT 0.2 (L) 05/10/2017   ALKPHOS 78 05/10/2017   AST 13 (L) 05/10/2017   ALT 16 (L) 05/10/2017  .   Total Time in preparing paper work, data evaluation and todays exam - 42 minutes  Lala Lund M.D on 05/10/2017 at 10:13 AM  [facility]   Office  [phone]

## 2017-05-11 LAB — CALCIUM, IONIZED: Calcium, Ionized, Serum: 4.4 mg/dL — ABNORMAL LOW (ref 4.5–5.6)

## 2017-05-13 LAB — CULTURE, BLOOD (ROUTINE X 2)
CULTURE: NO GROWTH
Culture: NO GROWTH
Culture: NO GROWTH
Culture: NO GROWTH
Special Requests: ADEQUATE
Special Requests: ADEQUATE

## 2017-05-17 DIAGNOSIS — Z4822 Encounter for aftercare following kidney transplant: Secondary | ICD-10-CM | POA: Diagnosis not present

## 2017-05-17 DIAGNOSIS — S43402D Unspecified sprain of left shoulder joint, subsequent encounter: Secondary | ICD-10-CM | POA: Diagnosis not present

## 2017-05-17 DIAGNOSIS — I82890 Acute embolism and thrombosis of other specified veins: Secondary | ICD-10-CM | POA: Diagnosis not present

## 2017-05-17 DIAGNOSIS — E871 Hypo-osmolality and hyponatremia: Secondary | ICD-10-CM | POA: Diagnosis not present

## 2017-05-17 DIAGNOSIS — D649 Anemia, unspecified: Secondary | ICD-10-CM | POA: Diagnosis not present

## 2017-05-17 DIAGNOSIS — Z794 Long term (current) use of insulin: Secondary | ICD-10-CM | POA: Diagnosis not present

## 2017-05-17 DIAGNOSIS — A048 Other specified bacterial intestinal infections: Secondary | ICD-10-CM | POA: Diagnosis not present

## 2017-05-17 DIAGNOSIS — Z872 Personal history of diseases of the skin and subcutaneous tissue: Secondary | ICD-10-CM | POA: Diagnosis not present

## 2017-05-17 DIAGNOSIS — Z8619 Personal history of other infectious and parasitic diseases: Secondary | ICD-10-CM | POA: Diagnosis not present

## 2017-05-17 DIAGNOSIS — Z79899 Other long term (current) drug therapy: Secondary | ICD-10-CM | POA: Diagnosis not present

## 2017-05-17 DIAGNOSIS — Z7952 Long term (current) use of systemic steroids: Secondary | ICD-10-CM | POA: Diagnosis not present

## 2017-05-17 DIAGNOSIS — D8989 Other specified disorders involving the immune mechanism, not elsewhere classified: Secondary | ICD-10-CM | POA: Diagnosis not present

## 2017-05-17 DIAGNOSIS — D72819 Decreased white blood cell count, unspecified: Secondary | ICD-10-CM | POA: Diagnosis not present

## 2017-05-17 DIAGNOSIS — B259 Cytomegaloviral disease, unspecified: Secondary | ICD-10-CM | POA: Diagnosis not present

## 2017-05-17 DIAGNOSIS — A072 Cryptosporidiosis: Secondary | ICD-10-CM | POA: Diagnosis not present

## 2017-05-17 DIAGNOSIS — I10 Essential (primary) hypertension: Secondary | ICD-10-CM | POA: Diagnosis not present

## 2017-05-17 DIAGNOSIS — Z940 Kidney transplant status: Secondary | ICD-10-CM | POA: Diagnosis not present

## 2017-05-17 DIAGNOSIS — E119 Type 2 diabetes mellitus without complications: Secondary | ICD-10-CM | POA: Diagnosis not present

## 2017-05-17 DIAGNOSIS — Z7984 Long term (current) use of oral hypoglycemic drugs: Secondary | ICD-10-CM | POA: Diagnosis not present

## 2017-05-30 DIAGNOSIS — B259 Cytomegaloviral disease, unspecified: Secondary | ICD-10-CM | POA: Diagnosis not present

## 2017-05-30 DIAGNOSIS — E119 Type 2 diabetes mellitus without complications: Secondary | ICD-10-CM | POA: Diagnosis not present

## 2017-05-30 DIAGNOSIS — D8989 Other specified disorders involving the immune mechanism, not elsewhere classified: Secondary | ICD-10-CM | POA: Diagnosis not present

## 2017-05-30 DIAGNOSIS — Z8619 Personal history of other infectious and parasitic diseases: Secondary | ICD-10-CM | POA: Diagnosis not present

## 2017-05-30 DIAGNOSIS — D649 Anemia, unspecified: Secondary | ICD-10-CM | POA: Diagnosis not present

## 2017-05-30 DIAGNOSIS — I10 Essential (primary) hypertension: Secondary | ICD-10-CM | POA: Diagnosis not present

## 2017-05-30 DIAGNOSIS — Z794 Long term (current) use of insulin: Secondary | ICD-10-CM | POA: Diagnosis not present

## 2017-05-30 DIAGNOSIS — Z79899 Other long term (current) drug therapy: Secondary | ICD-10-CM | POA: Diagnosis not present

## 2017-05-30 DIAGNOSIS — D72819 Decreased white blood cell count, unspecified: Secondary | ICD-10-CM | POA: Diagnosis not present

## 2017-05-30 DIAGNOSIS — E871 Hypo-osmolality and hyponatremia: Secondary | ICD-10-CM | POA: Diagnosis not present

## 2017-05-30 DIAGNOSIS — Z7952 Long term (current) use of systemic steroids: Secondary | ICD-10-CM | POA: Diagnosis not present

## 2017-05-30 DIAGNOSIS — T82868D Thrombosis of vascular prosthetic devices, implants and grafts, subsequent encounter: Secondary | ICD-10-CM | POA: Diagnosis not present

## 2017-05-30 DIAGNOSIS — Z940 Kidney transplant status: Secondary | ICD-10-CM | POA: Diagnosis not present

## 2017-05-30 DIAGNOSIS — Z4822 Encounter for aftercare following kidney transplant: Secondary | ICD-10-CM | POA: Diagnosis not present

## 2017-05-30 DIAGNOSIS — A072 Cryptosporidiosis: Secondary | ICD-10-CM | POA: Diagnosis not present

## 2017-06-21 ENCOUNTER — Ambulatory Visit (INDEPENDENT_AMBULATORY_CARE_PROVIDER_SITE_OTHER): Payer: Medicare Other | Admitting: Podiatry

## 2017-06-21 ENCOUNTER — Encounter: Payer: Self-pay | Admitting: Podiatry

## 2017-06-21 DIAGNOSIS — M79674 Pain in right toe(s): Secondary | ICD-10-CM

## 2017-06-21 DIAGNOSIS — E119 Type 2 diabetes mellitus without complications: Secondary | ICD-10-CM | POA: Diagnosis not present

## 2017-06-21 DIAGNOSIS — M79675 Pain in left toe(s): Secondary | ICD-10-CM

## 2017-06-21 DIAGNOSIS — B351 Tinea unguium: Secondary | ICD-10-CM | POA: Diagnosis not present

## 2017-06-21 DIAGNOSIS — S88911S Complete traumatic amputation of right lower leg, level unspecified, sequela: Secondary | ICD-10-CM | POA: Diagnosis not present

## 2017-06-21 NOTE — Patient Instructions (Signed)

## 2017-06-21 NOTE — Progress Notes (Signed)
This patient presents to the office with chief complaint of long thick nails and diabetic feet.  This patient  says there  is  no pain or  discomfort in his  feet.  This patient says there are long thick painful nails left foot.  These nails are painful walking and wearing shoes.  Patient has no history of infection or drainage from both feet.  Patient is unable to  self treat his own nails . This patient presents  to the office today for treatment of the  long nails and a foot evaluation due to history of  Diabetes. 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  General Appearance  Alert, conversant and in no acute stress.  Vascular  Dorsalis pedis and posterior tibial  pulses are palpable  Left..  Capillary return is within normal limits left foot. Temperature is within normal limits  Left foot.  Neurologic  Senn-Weinstein monofilament wire test within normal limits  bilaterally. Muscle power within normal limits bilaterally.  Nails Thick disfigured discolored nails with subungual debris  from hallux to fifth toes left foot.. No evidence of bacterial infection or drainage bilaterally.  Orthopedic  No limitations of motion of motion feet .  No crepitus or effusions noted.  No bony pathology or digital deformities noted. BK amputation right leg.  Skin  normotropic skin with no porokeratosis noted bilaterally.  No signs of infections or ulcers noted.     Onychomycosis  Diabetes with no foot complications  IE  Debride nails x 5.  A diabetic foot exam was performed and there is no evidence of any vascular or neurologic pathology left foot.     RTC 3 months for nail care and one year for his annual diabetic foot exam.   Gardiner Barefoot DPM

## 2017-06-23 ENCOUNTER — Telehealth (INDEPENDENT_AMBULATORY_CARE_PROVIDER_SITE_OTHER): Payer: Self-pay | Admitting: Orthopedic Surgery

## 2017-06-23 NOTE — Telephone Encounter (Signed)
Patient  Called needing a script for new liners for proctostatic.  Please call him to let him know when he can come pick them up. Chris @ [facility] told him they needed replaced asap.  [phone]

## 2017-06-24 NOTE — Telephone Encounter (Signed)
Holding for you.  

## 2017-06-27 NOTE — Telephone Encounter (Signed)
Pt is a right AKA and order written for new prosthetic liner. Called pt to advise [facility] is at the desk for pick up.

## 2017-07-01 DIAGNOSIS — N2581 Secondary hyperparathyroidism of renal origin: Secondary | ICD-10-CM | POA: Diagnosis not present

## 2017-07-01 DIAGNOSIS — I2699 Other pulmonary embolism without acute cor pulmonale: Secondary | ICD-10-CM | POA: Diagnosis not present

## 2017-07-01 DIAGNOSIS — Z79899 Other long term (current) drug therapy: Secondary | ICD-10-CM | POA: Diagnosis not present

## 2017-07-01 DIAGNOSIS — Z940 Kidney transplant status: Secondary | ICD-10-CM | POA: Diagnosis not present

## 2017-07-01 DIAGNOSIS — I82721 Chronic embolism and thrombosis of deep veins of right upper extremity: Secondary | ICD-10-CM | POA: Diagnosis not present

## 2017-07-01 DIAGNOSIS — D631 Anemia in chronic kidney disease: Secondary | ICD-10-CM | POA: Diagnosis not present

## 2017-07-01 DIAGNOSIS — E1129 Type 2 diabetes mellitus with other diabetic kidney complication: Secondary | ICD-10-CM | POA: Diagnosis not present

## 2017-07-01 DIAGNOSIS — Z89619 Acquired absence of unspecified leg above knee: Secondary | ICD-10-CM | POA: Diagnosis not present

## 2017-07-06 DIAGNOSIS — E1129 Type 2 diabetes mellitus with other diabetic kidney complication: Secondary | ICD-10-CM | POA: Diagnosis not present

## 2017-07-06 DIAGNOSIS — Z125 Encounter for screening for malignant neoplasm of prostate: Secondary | ICD-10-CM | POA: Diagnosis not present

## 2017-07-06 DIAGNOSIS — Z940 Kidney transplant status: Secondary | ICD-10-CM | POA: Diagnosis not present

## 2017-07-06 DIAGNOSIS — I129 Hypertensive chronic kidney disease with stage 1 through stage 4 chronic kidney disease, or unspecified chronic kidney disease: Secondary | ICD-10-CM | POA: Diagnosis not present

## 2017-07-11 ENCOUNTER — Emergency Department (HOSPITAL_COMMUNITY)
Admission: EM | Admit: 2017-07-11 | Discharge: 2017-07-11 | Disposition: A | Discharge status: Home / Self Care | Payer: Medicare Other | Attending: Emergency Medicine | Admitting: Emergency Medicine

## 2017-07-11 ENCOUNTER — Encounter (HOSPITAL_COMMUNITY): Payer: Self-pay | Admitting: *Deleted

## 2017-07-11 ENCOUNTER — Other Ambulatory Visit: Payer: Self-pay

## 2017-07-11 DIAGNOSIS — Z7984 Long term (current) use of oral hypoglycemic drugs: Secondary | ICD-10-CM | POA: Diagnosis not present

## 2017-07-11 DIAGNOSIS — I120 Hypertensive chronic kidney disease with stage 5 chronic kidney disease or end stage renal disease: Secondary | ICD-10-CM | POA: Insufficient documentation

## 2017-07-11 DIAGNOSIS — H9203 Otalgia, bilateral: Secondary | ICD-10-CM | POA: Diagnosis not present

## 2017-07-11 DIAGNOSIS — N186 End stage renal disease: Secondary | ICD-10-CM | POA: Diagnosis not present

## 2017-07-11 DIAGNOSIS — Z79899 Other long term (current) drug therapy: Secondary | ICD-10-CM | POA: Diagnosis not present

## 2017-07-11 MED ORDER — NEOMYCIN-COLIST-HC-THONZONIUM 3.3-3-10-0.5 MG/ML OT SUSP
4.0000 [drp] | Freq: Once | OTIC | Status: AC
  Administered 2017-07-11: 4 [drp] via OTIC
  Filled 2017-07-11: qty 10

## 2017-07-11 NOTE — ED Triage Notes (Signed)
Pt states he feels like something is crawling inside both ears since yesterday. Pt states he has pain in his right ear.

## 2017-07-11 NOTE — Discharge Instructions (Addendum)
Please read and follow all provided instructions.  You were seen here today for ear pain.   Please place 4 drops in ears every 6 hours for the next 1 week. Follow up with PCP.   Home Instructions: Please do not insert anything into your ear that is smaller than your elbow.  This includes QTips, keys, hair pins, etc.    If your ears produce extra wax, you can help reduce the build up by:  1) Allow soapy water to drip down into your ear canals when you wash your hair (it can dissolve the wax the same way that dishwashing liquid dissolves grease), and/or  2) Mix Hydrogen Peroxide half-and-half with water (DO NOT USE HYDROGEN PEROXIDE WITHOUT DILUTING IT WITH WATER as it can burn the skin in your ear); pour a little of this mixture into each ear canal while in the shower. Do this 2-3 times each week.  If your ears are itchy, place several drops of Sweet Oil into each canal to soothe the itching.  If it's not effective, place a small amount of hydrocortisone ointment on the tip of your pinky finger and rub it gently in the ear canal.  The heat of your body will melt the ointment, allowing it to spread in your ear canal and reduce the itching.  Follow-up instructions: Please follow-up with your primary care provider this week for further evaluation of symptoms and treatment.   Additional Information:  Your vital signs today were: BP (!) 142/82 (BP Location: Right Arm)    Pulse 80    Temp 98.5 F (36.9 C) (Oral)    Resp 18    SpO2 98%  If your blood pressure (BP) was elevated above 135/85 this visit, please have this repeated by your doctor within one month. ---------------

## 2017-07-11 NOTE — ED Provider Notes (Addendum)
[facility] DEPT Provider Note   CSN: [id number] Arrival date & time: 07/11/17  1601     History   Chief Complaint Chief Complaint  Patient presents with  . Otalgia    HPI Steven Logan is a 47 y.o. male with a history of diabetes, CKD, hypertension, hyperlipidemia who presents emergency department today for otalgia.  Patient states that over the last 2 days he has felt like something is "in his ear".  He reports he feels like something is crawling in his ears is very pruritic.  He notes that he has a mild pain on the outside of his right ear that is worse with palpation.  He has not been taking anything for symptoms.  He has tried flushing the ear out with water.  No other interventions prior to arrival.  He denies any fever, headache, visual changes, jaw claudication, ear discharge, hearing changes, tinnitus, vertigo, headache, barotrauma, nasal congestion, sinus pressure, rhinorrhea, sore throat, neck pain.  Patient does use Q-tips on a daily basis.  HPI  Past Medical History:  Diagnosis Date  . Abscess    great toe  . Anemia   . Arthritis   . Chronic kidney disease    esrd  . Depression   . Diabetes mellitus   . Dyslipidemia   . Eczema   . Gout   . HTN (hypertension)   . Morbid obesity ([place])   . Pancreatitis   . Pneumonia    2-3 years ago  . Pulmonary embolism ([place])    3  in  lungs  at  one time...  . Renal hypertension    Hemo started  Sept 2014- MWF  . Shortness of breath    ?? chest  cold he has now.  10/8- no SOB    Patient Active Problem List   Diagnosis Date Noted  . Fever in adult 05/08/2017  . Febrile illness, acute 05/08/2017  . History of right above knee amputation ([place]) 07/12/2016  . Acute blood loss anemia 06/25/2013  . Hyperglycemia 06/07/2013  . Arm DVT (deep venous thromboembolism), acute ([place]) 06/07/2013  . ESRD on dialysis ([place]) 06/05/2013  . Encephalopathy, toxic 06/03/2013  . Cellulitis and abscess of  leg 06/03/2013  . Anemia in chronic kidney disease 06/03/2013  . Arm edema 06/03/2013  . Leg abscess 06/03/2013  . Severe sepsis ([place]) 06/03/2013  . Septic shock ([place]) 06/03/2013  . Septic arthritis of ankle or foot, right 06/03/2013  . Abscess of right lower leg 06/02/2013  . Wrist lump 07/05/2012  . Swelling of limb-Left index and middle fingers 07/05/2012  . Aftercare following surgery of the circulatory system, [place] 07/05/2012  . Tingling-left wrist and some pain. 07/05/2012  . Pulmonary edema 05/04/2012  . Acute respiratory failure ([place]) 05/04/2012  . CKD (chronic kidney disease) stage 5, GFR less than 15 ml/min (HCC) 05/04/2012  . Hyperkalemia 05/04/2012  . Pre-operative cardiovascular examination 03/15/2012  . Iron deficiency anemia 09/22/2010  . HTN (hypertension) 09/22/2010  . Hyperlipidemia 09/22/2010  . Gout 09/22/2010  . CRI (chronic renal insufficiency) 09/22/2010  . Pulmonary embolism, bilateral ([place]) 09/22/2010    Past Surgical History:  Procedure Laterality Date  . AMPUTATION Right 06/12/2013   Procedure: Transtibial Amputation;  Surgeon: Newt Minion, MD;  Location: [facility];  Service: Orthopedics;  Laterality: Right;  . AMPUTATION Right 06/27/2013   Procedure: AMPUTATION ABOVE KNEE;  Surgeon: Newt Minion, MD;  Location: [facility];  Service: Orthopedics;  Laterality: Right;  Right Above Knee Amputation  . AV FISTULA PLACEMENT Left 04/07/2012   Procedure: ARTERIOVENOUS (AV) FISTULA CREATION;  Surgeon: Angelia Mould, MD;  Location: [facility];  Service: Vascular;  Laterality: Left;  . FISTULOGRAM Left 08/07/2012   Procedure: FISTULOGRAM;  Surgeon: Angelia Mould, MD;  Location: [facility] CATH LAB;  Service: Cardiovascular;  Laterality: Left;  arm  . I&D EXTREMITY Right 06/02/2013   Procedure: IRRIGATION AND DEBRIDEMENT ARTHROSCOPIC RIGHT ANKLE;  Surgeon: Augustin Schooling, MD;  Location: [facility];  Service: Orthopedics;  Laterality: Right;  . I&D EXTREMITY Right 06/04/2013    Procedure: IRRIGATION AND DEBRIDEMENT RIGHT FOOT/ANKLE;  Surgeon: Newt Minion, MD;  Location: [facility];  Service: Orthopedics;  Laterality: Right;  . I&D EXTREMITY Right 06/06/2013   Procedure: IRRIGATION AND DEBRIDEMENT EXTREMITY;  Surgeon: Newt Minion, MD;  Location: [facility];  Service: Orthopedics;  Laterality: Right;  . I&D EXTREMITY Right 06/24/2013   Procedure: IRRIGATION AND DEBRIDEMENT and Revsion of TRANSTIBIAL AMPUTATION;  Surgeon: Newt Minion, MD;  Location: [facility];  Service: Orthopedics;  Laterality: Right;  . INSERTION OF DIALYSIS CATHETER Right 09/19/2012   Procedure: INSERTION OF DIALYSIS CATHETER;  Surgeon: Angelia Mould, MD;  Location: [facility];  Service: Vascular;  Laterality: Right;  . LIGATION OF COMPETING BRANCHES OF ARTERIOVENOUS FISTULA Left 09/19/2012   Procedure: LIGATION OF COMPETING BRANCHES OF LEFT ARM ARTERIOVENOUS FISTULA; Vein angioplasty;  Surgeon: Angelia Mould, MD;  Location: [facility];  Service: Vascular;  Laterality: Left;  . none          Home Medications    Prior to Admission medications   Medication Sig Start Date End Date Taking? Authorizing Provider  ALINIA 500 MG tablet Take 500 mg by mouth 2 (two) times daily. 04/21/17   [provider]  amLODipine (NORVASC) 10 MG tablet Take 10 mg by mouth daily after breakfast.  04/13/17   [provider]  calcitRIOL (ROCALTROL) 0.5 MCG capsule Take 2 capsules (1 mcg total) by mouth every Monday, Wednesday, and Friday with hemodialysis. Patient not taking: Reported on 05/08/2017 07/02/13   Bonnielee Haff, MD  docusate sodium (COLACE) 100 MG capsule Take 1 capsule (100 mg total) by mouth every 12 (twelve) hours. Patient not taking: Reported on 05/08/2017 05/25/15   [name], MD  doxycycline (VIBRAMYCIN) 100 MG capsule Take 1 capsule (100 mg total) by mouth 2 (two) times daily. Patient not taking: Reported on 06/21/2017 05/10/17   Thurnell Lose, MD  JANUVIA 50 MG tablet Take 50 mg  by mouth daily after breakfast.  03/06/17   [provider]  labetalol (NORMODYNE) 200 MG tablet Take 100 mg by mouth 2 (two) times daily.     [provider]  mycophenolate (MYFORTIC) 180 MG EC tablet Take 360 mg by mouth 2 (two) times daily.  05/03/17   [provider]  polyethylene glycol (MIRALAX / GLYCOLAX) packet Take 17 g by mouth daily. 05/25/15   [name], MD  predniSONE (DELTASONE) 5 MG tablet Take 5 mg by mouth daily. 05/30/17   [provider]  sodium bicarbonate 650 MG tablet Take by mouth. 05/18/17   [provider]  tacrolimus (PROGRAF) 1 MG capsule Take 2 mg by mouth 2 (two) times daily. 05/03/17   [provider]    Family History Family History  Problem Relation Age of Onset  . Stroke Mother   . Diabetes Mother   . Hypertension Mother   . Hypertension Father   .  Cancer Unknown   . Coronary artery disease Unknown   . Colon cancer Maternal Grandmother 68  . Hypertension Brother     Social History Social History   Tobacco Use  . Smoking status: Never Smoker  . Smokeless tobacco: Never Used  Substance Use Topics  . Alcohol use: No    Comment: occ  . Drug use: Yes    Types: Marijuana    Comment: random, last 2 weeks ago     Allergies   Patient has no known allergies.   Review of Systems Review of Systems  All other systems reviewed and are negative.    Physical Exam Updated Vital Signs BP (!) 142/82 (BP Location: Right Arm)   Pulse 80   Temp 98.5 F (36.9 C) (Oral)   Resp 18   SpO2 98%   Physical Exam  Constitutional: He appears well-developed and well-nourished.  HENT:  Head: Normocephalic and atraumatic.  Right Ear: External ear normal.  Left Ear: Hearing, tympanic membrane, external ear and ear canal normal. No drainage, swelling or tenderness. No foreign bodies. Tympanic membrane is not injected, not perforated, not erythematous, not retracted and not bulging.  No middle ear  effusion.  Nose: Nose normal. Right sinus exhibits no maxillary sinus tenderness and no frontal sinus tenderness. Left sinus exhibits no maxillary sinus tenderness and no frontal sinus tenderness.  Mouth/Throat: Uvula is midline, oropharynx is clear and moist and mucous membranes are normal. No tonsillar exudate.  Patient with pain to palpation of the external ear on the right side.  He is noted to have redness and mild swelling of the ear canal on the right side. No FB visualized. No drainage. No cerumen.  TM is visualized and pearly gray with cone of light and bony landmarks visualized.  Patient without pain or swelling or redness over the mastoid.  No obliteration of postauricular crease.  No temporal tenderness palpation.  Eyes: Pupils are equal, round, and reactive to light. Right eye exhibits no discharge. Left eye exhibits no discharge. No scleral icterus.  Neck: Trachea normal. Neck supple. No spinous process tenderness present. No neck rigidity. Normal range of motion present.  No nuchal rigidity or meningismus  Cardiovascular: Normal rate, regular rhythm and intact distal pulses.  No murmur heard. Pulses:      Radial pulses are 2+ on the right side, and 2+ on the left side.  Pulmonary/Chest: Effort normal and breath sounds normal. He exhibits no tenderness.  Abdominal: Soft. Bowel sounds are normal. There is no tenderness. There is no rebound and no guarding.  Musculoskeletal: He exhibits no edema.  Prosthetic right leg  Lymphadenopathy:    He has no cervical adenopathy.  Neurological: He is alert.  Skin: Skin is warm and dry. No rash noted. Rash is not vesicular. He is not diaphoretic.  No vesicular-like rash.  Psychiatric: He has a normal mood and affect.  Nursing note and vitals reviewed.    ED Treatments / Results  Labs (all labs ordered are listed, but only abnormal results are displayed) Labs Reviewed - No data to display  EKG None  Radiology No results  found.  Procedures Procedures (including critical care time)  Medications Ordered in ED Medications  neomycin-colistin-hydrocortisone-thonzonium (CORTISPORIN TC) OTIC (EAR) suspension 4 drop (has no administration in time range)     Initial Impression / Assessment and Plan / ED Course  I have reviewed the triage vital signs and the nursing notes.  Pertinent labs & imaging results that were available during  my care of the patient were reviewed by me and considered in my medical decision making (see chart for details).      47 y.o. male presenting with itching in b/l ears and pain in right ear. Patient admits to q-tip use. No trauma, barotrauma, fever, HA, jaw claudication, or visual changes. Exam not concerning for AOM, GCA, mastoiditis or meningitis.  No vesicular-like rash to make any concern for shingles.  Patient is without FB on exam. Patient does have mild irritation of right ear canal.  Will treat as otitis externa.  Will give Cortisporin drops and have patient follow-up with PCP. I advised the patient to follow-up with PCP this week. Specific return precautions discussed. Time was given for all questions to be answered. The patient verbalized understanding and agreement with plan. The patient appears safe for discharge home.  Final Clinical Impressions(s) / ED Diagnoses   Final diagnoses:  Otalgia of both ears    ED Discharge Orders    None       Jillyn Ledger, PA-C 07/11/17 1757    Jillyn Ledger, PA-C 07/11/17 1819    Tegeler, Gwenyth Allegra, MD 07/11/17 [phone]

## 2017-07-19 DIAGNOSIS — Z940 Kidney transplant status: Secondary | ICD-10-CM | POA: Diagnosis not present

## 2017-08-15 DIAGNOSIS — I129 Hypertensive chronic kidney disease with stage 1 through stage 4 chronic kidney disease, or unspecified chronic kidney disease: Secondary | ICD-10-CM | POA: Diagnosis not present

## 2017-08-15 DIAGNOSIS — Z940 Kidney transplant status: Secondary | ICD-10-CM | POA: Diagnosis not present

## 2017-08-17 ENCOUNTER — Other Ambulatory Visit: Payer: Self-pay

## 2017-08-17 ENCOUNTER — Encounter (INDEPENDENT_AMBULATORY_CARE_PROVIDER_SITE_OTHER): Payer: Self-pay | Admitting: Physician Assistant

## 2017-08-17 ENCOUNTER — Ambulatory Visit (INDEPENDENT_AMBULATORY_CARE_PROVIDER_SITE_OTHER): Payer: Medicare Other | Admitting: Physician Assistant

## 2017-08-17 VITALS — BP 129/79 | HR 74 | Temp 98.1°F | Ht 72.0 in | Wt 228.8 lb

## 2017-08-17 DIAGNOSIS — E118 Type 2 diabetes mellitus with unspecified complications: Secondary | ICD-10-CM

## 2017-08-17 DIAGNOSIS — Z114 Encounter for screening for human immunodeficiency virus [HIV]: Secondary | ICD-10-CM | POA: Diagnosis not present

## 2017-08-17 DIAGNOSIS — Z7689 Persons encountering health services in other specified circumstances: Secondary | ICD-10-CM

## 2017-08-17 DIAGNOSIS — Z23 Encounter for immunization: Secondary | ICD-10-CM

## 2017-08-17 MED ORDER — PNEUMOCOCCAL 13-VAL CONJ VACC IM SUSP: 0.5000 mL | Freq: Once | INTRAMUSCULAR | Status: DC

## 2017-08-17 NOTE — Patient Instructions (Signed)
Td Vaccine (Tetanus and Diphtheria): What You Need to Know 1. Why get vaccinated? Tetanus  and diphtheria are very serious diseases. They are rare in the [place] today, but people who do become infected often have severe complications. Td vaccine is used to protect adolescents and adults from both of these diseases. Both tetanus and diphtheria are infections caused by bacteria. Diphtheria spreads from person to person through coughing or sneezing. Tetanus-causing bacteria enter the body through cuts, scratches, or wounds. TETANUS (lockjaw) causes painful muscle tightening and stiffness, usually all over the body.  It can lead to tightening of muscles in the head and neck so you can't open your mouth, swallow, or sometimes even breathe. Tetanus kills about 1 out of every 10 people who are infected even after receiving the best medical care.  DIPHTHERIA can cause a thick coating to form in the back of the throat.  It can lead to breathing problems, paralysis, heart failure, and death.  Before vaccines, as many as 200,000 cases of diphtheria and hundreds of cases of tetanus were reported in the [place] each year. Since vaccination began, reports of cases for both diseases have dropped by about 99%. 2. Td vaccine Td vaccine can protect adolescents and adults from tetanus and diphtheria. Td is usually given as a booster dose every 10 years but it can also be given earlier after a severe and dirty wound or burn. Another vaccine, called Tdap, which protects against pertussis in addition to tetanus and diphtheria, is sometimes recommended instead of Td vaccine. Your doctor or the person giving you the vaccine can give you more information. Td may safely be given at the same time as other vaccines. 3. Some people should not get this vaccine  A person who has ever had a life-threatening allergic reaction after a previous dose of any tetanus or diphtheria containing vaccine, OR has a severe  allergy to any part of this vaccine, should not get Td vaccine. Tell the person giving the vaccine about any severe allergies.  Talk to your doctor if you: ? had severe pain or swelling after any vaccine containing diphtheria or tetanus, ? ever had a condition called Guillain Barre Syndrome (GBS), ? aren't feeling well on the day the shot is scheduled. 4. What are the risks from Td vaccine? With any medicine, including vaccines, there is a chance of side effects. These are usually mild and go away on their own. Serious reactions are also possible but are rare. Most people who get Td vaccine do not have any problems with it. Mild problems following Td vaccine: (Did not interfere with activities)  Pain where the shot was given (about 8 people in 10)  Redness or swelling where the shot was given (about 1 person in 4)  Mild fever (rare)  Headache (about 1 person in 4)  Tiredness (about 1 person in 4)  Moderate problems following Td vaccine: (Interfered with activities, but did not require medical attention)  Fever over 102F (rare)  Severe problems following Td vaccine: (Unable to perform usual activities; required medical attention)  Swelling, severe pain, bleeding and/or redness in the arm where the shot was given (rare).  Problems that could happen after any vaccine:  People sometimes faint after a medical procedure, including vaccination. Sitting or lying down for about 15 minutes can help prevent fainting, and injuries caused by a fall. Tell your doctor if you feel dizzy, or have vision changes or ringing in the ears.  Some people get   severe pain in the shoulder and have difficulty moving the arm where a shot was given. This happens very rarely.  Any medication can cause a severe allergic reaction. Such reactions from a vaccine are very rare, estimated at fewer than 1 in a million doses, and would happen within a few minutes to a few hours after the vaccination. As with any  medicine, there is a very remote chance of a vaccine causing a serious injury or death. The safety of vaccines is always being monitored. For more information, visit: www.cdc.gov/vaccinesafety/ 5. What if there is a serious reaction? What should I look for? Look for anything that concerns you, such as signs of a severe allergic reaction, very high fever, or unusual behavior. Signs of a severe allergic reaction can include hives, swelling of the face and throat, difficulty breathing, a fast heartbeat, dizziness, and weakness. These would usually start a few minutes to a few hours after the vaccination. What should I do?  If you think it is a severe allergic reaction or other emergency that can't wait, call 9-1-1 or get the person to the nearest hospital. Otherwise, call your doctor.  Afterward, the reaction should be reported to the Vaccine Adverse Event Reporting System (VAERS). Your doctor might file this report, or you can do it yourself through the VAERS web site at www.vaers.hhs.gov, or by calling 1-800-822-7967. ? VAERS does not give medical advice. 6. The National Vaccine Injury Compensation Program The National Vaccine Injury Compensation Program (VICP) is a federal program that was created to compensate people who may have been injured by certain vaccines. Persons who believe they may have been injured by a vaccine can learn about the program and about filing a claim by calling 1-800-338-2382 or visiting the VICP website at www.hrsa.gov/vaccinecompensation. There is a time limit to file a claim for compensation. 7. How can I learn more?  Ask your doctor. He or she can give you the vaccine package insert or suggest other sources of information.  Call your local or state health department.  Contact the Centers for Disease Control and Prevention (CDC): ? Call 1-800-232-4636 (1-800-CDC-INFO) ? Visit CDC's website at www.cdc.gov/vaccines CDC Td Vaccine VIS (05/06/15) This information is  not intended to replace advice given to you by your health care provider. Make sure you discuss any questions you have with your health care provider. Document Released: 11/08/2005 Document Revised: 10/02/2015 Document Reviewed: 10/02/2015 Elsevier Interactive Patient Education  2017 Elsevier Inc.  

## 2017-08-17 NOTE — Progress Notes (Signed)
Subjective:  Patient ID: Steven Logan, male    DOB: 01-24-1971  Age: 47 y.o. MRN: [id number]  CC: establish care  HPI Steven Logan is a 47 y.o. male with a medical history of depression, DM2, HLD, HTN, ESRD s/p renal transplant, PNA, PE, right aka, renal hypertension, and morbid obesity presents as a new patient for general health maintenance. No specific complaints. Would like a referral to ophthalmology for diabetic retinopathy screening. Last A1c 5.8% three months ago. Eats as healthy as he can. Does not endorse any symptoms or complaints.    Outpatient Medications Prior to Visit  Medication Sig Dispense Refill  . amLODipine (NORVASC) 10 MG tablet Take 10 mg by mouth daily after breakfast.     . JANUVIA 50 MG tablet Take 50 mg by mouth daily after breakfast.     . labetalol (NORMODYNE) 200 MG tablet Take 100 mg by mouth 2 (two) times daily.     . mycophenolate (MYFORTIC) 180 MG EC tablet Take 360 mg by mouth 2 (two) times daily.   5  . polyethylene glycol (MIRALAX / GLYCOLAX) packet Take 17 g by mouth daily. 14 each 0  . predniSONE (DELTASONE) 5 MG tablet Take 5 mg by mouth daily.  3  . sodium bicarbonate 650 MG tablet Take by mouth.    . tacrolimus (PROGRAF) 1 MG capsule Take 2 mg by mouth 2 (two) times daily.  5  . ALINIA 500 MG tablet Take 500 mg by mouth 2 (two) times daily.  0  . calcitRIOL (ROCALTROL) 0.5 MCG capsule Take 2 capsules (1 mcg total) by mouth every Monday, Wednesday, and Friday with hemodialysis. (Patient not taking: Reported on 05/08/2017)    . docusate sodium (COLACE) 100 MG capsule Take 1 capsule (100 mg total) by mouth every 12 (twelve) hours. (Patient not taking: Reported on 05/08/2017) 60 capsule 0  . doxycycline (VIBRAMYCIN) 100 MG capsule Take 1 capsule (100 mg total) by mouth 2 (two) times daily. (Patient not taking: Reported on 06/21/2017) 14 capsule 0   No facility-administered medications prior to visit.      ROS Review of Systems   Constitutional: Negative for chills, fever and malaise/fatigue.  Eyes: Negative for blurred vision.  Respiratory: Negative for shortness of breath.   Cardiovascular: Negative for chest pain and palpitations.  Gastrointestinal: Negative for abdominal pain and nausea.  Genitourinary: Negative for dysuria and hematuria.  Musculoskeletal: Negative for joint pain and myalgias.  Skin: Negative for rash.  Neurological: Negative for tingling and headaches.  Psychiatric/Behavioral: Negative for depression. The patient is not nervous/anxious.     Objective:  BP 129/79 (BP Location: Left Arm, Patient Position: Sitting, Cuff Size: Large)   Pulse 74   Temp 98.1 F (36.7 C) (Oral)   Ht 6' (1.829 m)   Wt 228 lb 12.8 oz (103.8 kg)   SpO2 98%   BMI 31.03 kg/m   BP/Weight 08/17/2017 07/11/2017 2/99/3716  Systolic BP 967 893 810  Diastolic BP 79 70 75  Wt. (Lbs) 228.8 - -  BMI 31.03 - -      Physical Exam  Constitutional: He is oriented to person, place, and time.  Well developed, well nourished, NAD, polite  HENT:  Head: Normocephalic and atraumatic.  Eyes: No scleral icterus.  Neck: Normal range of motion. Neck supple. No thyromegaly present.  Cardiovascular: Normal rate, regular rhythm and normal heart sounds.  Pulmonary/Chest: Effort normal and breath sounds normal.  Abdominal: Soft. Bowel sounds are normal. There is  no tenderness.  Musculoskeletal: He exhibits no edema.  Right aka.  Neurological: He is alert and oriented to person, place, and time.  Skin: Skin is warm and dry. No rash noted. No erythema. No pallor.  Psychiatric: He has a normal mood and affect. His behavior is normal. Thought content normal.  Vitals reviewed.    Assessment & Plan:    1. Encounter to establish care  2. Type 2 diabetes mellitus with complication, without long-term current use of insulin ([place]) - Ambulatory referral to Ophthalmology  3. Need for Tdap vaccination - Tdap vaccine greater than  or equal to 7yo IM  4. Need for prophylactic vaccination against Streptococcus pneumoniae (pneumococcus) - Microalbumin / creatinine urine ratio  5. Screening for HIV (human immunodeficiency virus) - HIV antibody   Meds ordered this encounter  Medications  . DISCONTD: pneumococcal 13-valent conjugate vaccine (PREVNAR 13) injection 0.5 mL    Follow-up: Return in about 3 months (around 11/17/2017) for A1c.   Clent Demark PA

## 2017-08-18 LAB — HIV ANTIBODY (ROUTINE TESTING W REFLEX): HIV Screen 4th Generation wRfx: NONREACTIVE

## 2017-08-18 LAB — MICROALBUMIN / CREATININE URINE RATIO
Creatinine, Urine: 178.5 mg/dL
MICROALB/CREAT RATIO: 12.8 mg/g{creat} (ref 0.0–30.0)
MICROALBUM., U, RANDOM: 22.8 ug/mL

## 2017-08-19 ENCOUNTER — Telehealth (INDEPENDENT_AMBULATORY_CARE_PROVIDER_SITE_OTHER): Payer: Self-pay

## 2017-08-19 NOTE — Telephone Encounter (Signed)
-----   Message from Clent Demark, PA-C sent at 08/18/2017  5:10 PM EDT ----- HIV negative and normal urinary proteins.

## 2017-08-19 NOTE — Telephone Encounter (Signed)
Patient is aware of negative HIV and normal urinary proteins. Nat Christen, CMA

## 2017-08-29 DIAGNOSIS — H35033 Hypertensive retinopathy, bilateral: Secondary | ICD-10-CM | POA: Diagnosis not present

## 2017-08-29 DIAGNOSIS — E119 Type 2 diabetes mellitus without complications: Secondary | ICD-10-CM | POA: Diagnosis not present

## 2017-08-29 LAB — HM DIABETES EYE EXAM

## 2017-09-20 ENCOUNTER — Encounter: Payer: Self-pay | Admitting: Podiatry

## 2017-09-20 ENCOUNTER — Ambulatory Visit (INDEPENDENT_AMBULATORY_CARE_PROVIDER_SITE_OTHER): Payer: Medicare Other | Admitting: Podiatry

## 2017-09-20 DIAGNOSIS — B351 Tinea unguium: Secondary | ICD-10-CM | POA: Diagnosis not present

## 2017-09-20 DIAGNOSIS — M79674 Pain in right toe(s): Secondary | ICD-10-CM

## 2017-09-20 DIAGNOSIS — M79675 Pain in left toe(s): Secondary | ICD-10-CM | POA: Diagnosis not present

## 2017-09-20 DIAGNOSIS — S88911S Complete traumatic amputation of right lower leg, level unspecified, sequela: Secondary | ICD-10-CM

## 2017-09-20 DIAGNOSIS — E119 Type 2 diabetes mellitus without complications: Secondary | ICD-10-CM

## 2017-09-20 NOTE — Progress Notes (Addendum)
Complaint:  Visit Type: Patient returns to my office for continued preventative foot care services. Complaint: Patient states" my nails have grown long and thick and become painful to walk and wear shoes" Patient has been diagnosed with DM with no foot complications. The patient presents for preventative foot care services. No changes to ROS  Podiatric Exam: Vascular: dorsalis pedis and posterior tibial pulses are weakly  palpable left foot.. Capillary return is immediate. Temperature gradient is WNL. Skin turgor WNL  Sensorium: Normal Semmes Weinstein monofilament test. Normal tactile sensation left foot.. Nail Exam: Pt has thick disfigured discolored nails with subungual debris noted entire nail hallux through fifth toenails left foot. Ulcer Exam: There is no evidence of ulcer or pre-ulcerative changes or infection. Orthopedic Exam: Muscle tone and strength are WNL. No limitations in general ROM. No crepitus or effusions noted. Foot type and digits show no abnormalities. 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 Skin: No Porokeratosis. No infection or ulcers  Diagnosis:  Onychomycosis, , Pain in right toe, pain in left toes  Treatment & Plan Procedures and Treatment: Consent by patient was obtained for treatment procedures.   Debridement of mycotic and hypertrophic toenails, 1 through 5 bilateral and clearing of subungual debris. No ulceration, no infection noted. ABN signed for 2019. Return Visit-Office Procedure: Patient instructed to return to the office for a follow up visit 3 months for continued evaluation and treatment.    Gardiner Barefoot DPM

## 2017-09-21 DIAGNOSIS — Z940 Kidney transplant status: Secondary | ICD-10-CM | POA: Diagnosis not present

## 2017-09-21 DIAGNOSIS — I129 Hypertensive chronic kidney disease with stage 1 through stage 4 chronic kidney disease, or unspecified chronic kidney disease: Secondary | ICD-10-CM | POA: Diagnosis not present

## 2017-10-26 DIAGNOSIS — I129 Hypertensive chronic kidney disease with stage 1 through stage 4 chronic kidney disease, or unspecified chronic kidney disease: Secondary | ICD-10-CM | POA: Diagnosis not present

## 2017-10-26 DIAGNOSIS — Z940 Kidney transplant status: Secondary | ICD-10-CM | POA: Diagnosis not present

## 2017-10-26 DIAGNOSIS — E1129 Type 2 diabetes mellitus with other diabetic kidney complication: Secondary | ICD-10-CM | POA: Diagnosis not present

## 2017-11-02 DIAGNOSIS — I82721 Chronic embolism and thrombosis of deep veins of right upper extremity: Secondary | ICD-10-CM | POA: Diagnosis not present

## 2017-11-02 DIAGNOSIS — Z89619 Acquired absence of unspecified leg above knee: Secondary | ICD-10-CM | POA: Diagnosis not present

## 2017-11-02 DIAGNOSIS — N2581 Secondary hyperparathyroidism of renal origin: Secondary | ICD-10-CM | POA: Diagnosis not present

## 2017-11-02 DIAGNOSIS — N529 Male erectile dysfunction, unspecified: Secondary | ICD-10-CM | POA: Diagnosis not present

## 2017-11-02 DIAGNOSIS — N189 Chronic kidney disease, unspecified: Secondary | ICD-10-CM | POA: Diagnosis not present

## 2017-11-02 DIAGNOSIS — D631 Anemia in chronic kidney disease: Secondary | ICD-10-CM | POA: Diagnosis not present

## 2017-11-02 DIAGNOSIS — E1129 Type 2 diabetes mellitus with other diabetic kidney complication: Secondary | ICD-10-CM | POA: Diagnosis not present

## 2017-11-02 DIAGNOSIS — Z940 Kidney transplant status: Secondary | ICD-10-CM | POA: Diagnosis not present

## 2017-11-02 DIAGNOSIS — I2699 Other pulmonary embolism without acute cor pulmonale: Secondary | ICD-10-CM | POA: Diagnosis not present

## 2017-11-02 DIAGNOSIS — Z79899 Other long term (current) drug therapy: Secondary | ICD-10-CM | POA: Diagnosis not present

## 2017-11-17 ENCOUNTER — Ambulatory Visit (INDEPENDENT_AMBULATORY_CARE_PROVIDER_SITE_OTHER): Payer: Medicare Other | Admitting: Physician Assistant

## 2017-11-21 ENCOUNTER — Ambulatory Visit (INDEPENDENT_AMBULATORY_CARE_PROVIDER_SITE_OTHER): Payer: Medicare Other | Admitting: Physician Assistant

## 2017-11-21 ENCOUNTER — Encounter (INDEPENDENT_AMBULATORY_CARE_PROVIDER_SITE_OTHER): Payer: Self-pay | Admitting: Physician Assistant

## 2017-11-21 VITALS — BP 117/79 | HR 74 | Temp 98.0°F | Resp 16 | Ht 72.0 in | Wt 227.0 lb

## 2017-11-21 DIAGNOSIS — Z23 Encounter for immunization: Secondary | ICD-10-CM | POA: Diagnosis not present

## 2017-11-21 DIAGNOSIS — E118 Type 2 diabetes mellitus with unspecified complications: Secondary | ICD-10-CM

## 2017-11-21 DIAGNOSIS — R600 Localized edema: Secondary | ICD-10-CM

## 2017-11-21 LAB — POCT GLYCOSYLATED HEMOGLOBIN (HGB A1C): Hemoglobin A1C: 5.9 % — AB (ref 4.0–5.6)

## 2017-11-21 NOTE — Progress Notes (Signed)
Patient is here for A1C.  Pt. Stated he would like the Flu vaccine.

## 2017-11-21 NOTE — Patient Instructions (Signed)
Edema Edema is when you have too much fluid in your body or under your skin. Edema may make your legs, feet, and ankles swell up. Swelling is also common in looser tissues, like around your eyes. This is a common condition. It gets more common as you get older. There are many possible causes of edema. Eating too much salt (sodium) and being on your feet or sitting for a long time can cause edema in your legs, feet, and ankles. Hot weather may make edema worse. Edema is usually painless. Your skin may look swollen or shiny. Follow these instructions at home:  Keep the swollen body part raised (elevated) above the level of your heart when you are sitting or lying down.  Do not sit still or stand for a long time.  Do not wear tight clothes. Do not wear garters on your upper legs.  Exercise your legs. This can help the swelling go down.  Wear elastic bandages or support stockings as told by your doctor.  Eat a low-salt (low-sodium) diet to reduce fluid as told by your doctor.  Depending on the cause of your swelling, you may need to limit how much fluid you drink (fluid restriction).  Take over-the-counter and prescription medicines only as told by your doctor. Contact a doctor if:  Treatment is not working.  You have heart, liver, or kidney disease and have symptoms of edema.  You have sudden and unexplained weight gain. Get help right away if:  You have shortness of breath or chest pain.  You cannot breathe when you lie down.  You have pain, redness, or warmth in the swollen areas.  You have heart, liver, or kidney disease and get edema all of a sudden.  You have a fever and your symptoms get worse all of a sudden. Summary  Edema is when you have too much fluid in your body or under your skin.  Edema may make your legs, feet, and ankles swell up. Swelling is also common in looser tissues, like around your eyes.  Raise (elevate) the swollen body part above the level of your  heart when you are sitting or lying down.  Follow your doctor's instructions about diet and how much fluid you can drink (fluid restriction). This information is not intended to replace advice given to you by your health care provider. Make sure you discuss any questions you have with your health care provider. Document Released: 06/30/2007 Document Revised: 01/30/2016 Document Reviewed: 01/30/2016 Elsevier Interactive Patient Education  2017 Elsevier Inc.  

## 2017-11-21 NOTE — Progress Notes (Signed)
Subjective:  Patient ID: Steven Logan, male    DOB: 1970-07-26  Age: 47 y.o. MRN: [id number]  CC: f/u A1c   HPI Steven Logan is a 47 y.o. male with a medical history of depression, DM2, HLD, HTN, ESRD s/p renal transplant, PNA, PE, right aka, renal hypertension, and morbid obesity presents to f/u on DM2. Last A1c 5.8% six months ago. Taking anti-glycemics as directed. A1c 5.9% today. Says he has significantly decreased his carbohydrates and sweets. Drinks more water.     Main complain is of exertional dyspnea since 4 days ago. Says he had "a touch" of asthma" over ten years ago. Used to use albuterol and Advair. Had a PE many years ago but does not feel recent symptoms are similar. Denies LE edema, PND, orthopnea, CP, and does not know of any allergic triggers. Says nephrologist has recently prescribed him furosemide 20 mg but has not taken yet.    Outpatient Medications Prior to Visit  Medication Sig Dispense Refill  . amLODipine (NORVASC) 10 MG tablet Take 10 mg by mouth daily after breakfast.     . JANUVIA 50 MG tablet Take 50 mg by mouth daily after breakfast.     . labetalol (NORMODYNE) 200 MG tablet Take 100 mg by mouth 2 (two) times daily.     . mycophenolate (MYFORTIC) 180 MG EC tablet Take 360 mg by mouth 2 (two) times daily.   5  . neomycin-colistin-hydrocortisone-thonzonium (CORTISPORIN TC) 3.03-27-08-0.5 MG/ML OTIC suspension Place 4 drops into the right ear 2 (two) times daily.    . polyethylene glycol (MIRALAX / GLYCOLAX) packet Take 17 g by mouth daily. 14 each 0  . predniSONE (DELTASONE) 5 MG tablet Take 5 mg by mouth daily.  3  . sodium bicarbonate 650 MG tablet Take by mouth.    . tacrolimus (PROGRAF) 1 MG capsule Take 2 mg by mouth 2 (two) times daily.  5   No facility-administered medications prior to visit.      ROS Review of Systems  Constitutional: Negative for chills, fever and malaise/fatigue.  Eyes: Negative for blurred vision.  Respiratory: Negative  for shortness of breath.        Exertional dyspnea  Cardiovascular: Negative for chest pain and palpitations.  Gastrointestinal: Negative for abdominal pain and nausea.  Genitourinary: Negative for dysuria and hematuria.  Musculoskeletal: Negative for joint pain and myalgias.  Skin: Negative for rash.  Neurological: Negative for tingling and headaches.  Psychiatric/Behavioral: Negative for depression. The patient is not nervous/anxious.     Objective:  BP 117/79 (BP Location: Left Arm, Patient Position: Sitting, Cuff Size: Normal)   Pulse 74   Temp 98 F (36.7 C) (Oral)   Resp 16   Ht 6' (1.829 m)   Wt 227 lb (103 kg)   SpO2 94%   BMI 30.79 kg/m   BP/Weight 11/21/2017 08/17/2017 0/08/6759  Systolic BP 950 932 671  Diastolic BP 79 79 70  Wt. (Lbs) 227 228.8 -  BMI 30.79 31.03 -      Physical Exam  Constitutional: He is oriented to person, place, and time.  Well developed, obese, NAD, polite  HENT:  Head: Normocephalic and atraumatic.  Eyes: No scleral icterus.  Neck: Normal range of motion. Neck supple. No thyromegaly present.  Cardiovascular: Normal rate, regular rhythm and normal heart sounds.  1+ pitting edema of the LLE. S/p right AKA   Pulmonary/Chest: Effort normal and breath sounds normal. No stridor. No respiratory distress. He has no  wheezes. He has no rales.  Musculoskeletal: He exhibits no edema.  Neurological: He is alert and oriented to person, place, and time.  Skin: Skin is warm and dry. No rash noted. No erythema. No pallor.  Psychiatric: He has a normal mood and affect. His behavior is normal. Thought content normal.  Vitals reviewed.    Assessment & Plan:    1. Type 2 diabetes mellitus with complication, without long-term current use of insulin (HCC) - HgB A1c 5.9% - Comprehensive metabolic panel  2. Lower extremity edema - Comprehensive metabolic panel - Take the furosemide 20 mg prescribed to you by your nephrologist.   3. Need for  influenza vaccination - Flu Vaccine QUAD 6+ mos PF IM (Fluarix Quad PF)  4. Need for prophylactic vaccination against Streptococcus pneumoniae (pneumococcus) - Pneumococcal polysaccharide vaccine 23-valent greater than or equal to 2yo subcutaneous/IM     Follow-up: Return in about 6 weeks (around 01/02/2018) for LE edema.   Clent Demark PA

## 2017-12-08 DIAGNOSIS — Z940 Kidney transplant status: Secondary | ICD-10-CM | POA: Diagnosis not present

## 2017-12-08 DIAGNOSIS — I129 Hypertensive chronic kidney disease with stage 1 through stage 4 chronic kidney disease, or unspecified chronic kidney disease: Secondary | ICD-10-CM | POA: Diagnosis not present

## 2017-12-20 ENCOUNTER — Ambulatory Visit: Payer: Medicare Other | Admitting: Podiatry

## 2017-12-21 ENCOUNTER — Ambulatory Visit (INDEPENDENT_AMBULATORY_CARE_PROVIDER_SITE_OTHER): Payer: Medicare Other | Admitting: Podiatry

## 2017-12-21 ENCOUNTER — Encounter: Payer: Self-pay | Admitting: Podiatry

## 2017-12-21 ENCOUNTER — Ambulatory Visit: Payer: Medicare Other | Admitting: Podiatry

## 2017-12-21 DIAGNOSIS — S88911S Complete traumatic amputation of right lower leg, level unspecified, sequela: Secondary | ICD-10-CM

## 2017-12-21 DIAGNOSIS — M79675 Pain in left toe(s): Secondary | ICD-10-CM

## 2017-12-21 DIAGNOSIS — M79674 Pain in right toe(s): Secondary | ICD-10-CM | POA: Diagnosis not present

## 2017-12-21 DIAGNOSIS — E119 Type 2 diabetes mellitus without complications: Secondary | ICD-10-CM

## 2017-12-21 DIAGNOSIS — B351 Tinea unguium: Secondary | ICD-10-CM | POA: Diagnosis not present

## 2017-12-21 NOTE — Progress Notes (Signed)
Complaint:  Visit Type: Patient returns to my office for continued preventative foot care services. Complaint: Patient states" my nails have grown long and thick and become painful to walk and wear shoes" Patient has been diagnosed with DM with no foot complications. The patient presents for preventative foot care services. No changes to ROS  Podiatric Exam: Vascular: dorsalis pedis and posterior tibial pulses are weakly  palpable left foot.. Capillary return is immediate. Temperature gradient is WNL. Skin turgor WNL  Sensorium: Normal Semmes Weinstein monofilament test. Normal tactile sensation left foot.. Nail Exam: Pt has thick disfigured discolored nails with subungual debris noted entire nail hallux through fifth toenails left foot. Ulcer Exam: There is no evidence of ulcer or pre-ulcerative changes or infection. Orthopedic Exam: Muscle tone and strength are WNL. No limitations in general ROM. No crepitus or effusions noted. Foot type and digits show no abnormalities. 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 Skin: No Porokeratosis. No infection or ulcers  Diagnosis:  Onychomycosis, , , pain in left toes  Treatment & Plan Procedures and Treatment: Consent by patient was obtained for treatment procedures.   Debridement of mycotic and hypertrophic toenails, 1 through 5 bilateral and clearing of subungual debris. No ulceration, no infection noted. ABN signed for 2019. Return Visit-Office Procedure: Patient instructed to return to the office for a follow up visit 3 months for continued evaluation and treatment.    Gardiner Barefoot DPM

## 2018-01-02 ENCOUNTER — Ambulatory Visit (INDEPENDENT_AMBULATORY_CARE_PROVIDER_SITE_OTHER): Payer: Medicare Other | Admitting: Physician Assistant

## 2018-01-11 DIAGNOSIS — I129 Hypertensive chronic kidney disease with stage 1 through stage 4 chronic kidney disease, or unspecified chronic kidney disease: Secondary | ICD-10-CM | POA: Diagnosis not present

## 2018-01-11 DIAGNOSIS — Z940 Kidney transplant status: Secondary | ICD-10-CM | POA: Diagnosis not present

## 2018-01-12 ENCOUNTER — Ambulatory Visit (INDEPENDENT_AMBULATORY_CARE_PROVIDER_SITE_OTHER): Payer: Medicare Other | Admitting: Physician Assistant

## 2018-01-12 ENCOUNTER — Other Ambulatory Visit: Payer: Self-pay

## 2018-01-12 ENCOUNTER — Encounter (INDEPENDENT_AMBULATORY_CARE_PROVIDER_SITE_OTHER): Payer: Self-pay | Admitting: Physician Assistant

## 2018-01-12 VITALS — BP 122/82 | HR 75 | Temp 97.8°F | Ht 72.0 in | Wt 217.2 lb

## 2018-01-12 DIAGNOSIS — R600 Localized edema: Secondary | ICD-10-CM

## 2018-01-12 DIAGNOSIS — J069 Acute upper respiratory infection, unspecified: Secondary | ICD-10-CM | POA: Diagnosis not present

## 2018-01-12 NOTE — Patient Instructions (Signed)
Pt declined AVS.

## 2018-01-12 NOTE — Progress Notes (Signed)
Subjective:  Patient ID: Steven Logan, male    DOB: 06/01/1970  Age: 47 y.o. MRN: [id number]  CC:  LE edema  HPI Steven Logan a 47 y.o.malewith a medical history of depression, DM2, HLD, HTN, ESRD s/p renal transplant, PNA, PE, right aka, renal hypertension, and morbid obesity presents to f/u on LE edema. Says he started taking furosemide 20 mg prescribed by his nephrologist. LE edema has resolved.     Had cold symptoms since two weeks ago. He feels much better but states there is an occasional cough. Does not endorse any other symptoms or complaints.     Outpatient Medications Prior to Visit  Medication Sig Dispense Refill  . amLODipine (NORVASC) 10 MG tablet Take 10 mg by mouth daily after breakfast.     . JANUVIA 50 MG tablet Take 50 mg by mouth daily after breakfast.     . labetalol (NORMODYNE) 200 MG tablet Take 100 mg by mouth 2 (two) times daily.     . mycophenolate (MYFORTIC) 180 MG EC tablet Take 360 mg by mouth 2 (two) times daily.   5  . predniSONE (DELTASONE) 5 MG tablet Take 5 mg by mouth daily.  3  . sodium bicarbonate 650 MG tablet Take by mouth.    . tacrolimus (PROGRAF) 1 MG capsule Take 2 mg by mouth 2 (two) times daily.  5  . polyethylene glycol (MIRALAX / GLYCOLAX) packet Take 17 g by mouth daily. 14 each 0  . neomycin-colistin-hydrocortisone-thonzonium (CORTISPORIN TC) 3.03-27-08-0.5 MG/ML OTIC suspension Place 4 drops into the right ear 2 (two) times daily.     No facility-administered medications prior to visit.      ROS Review of Systems  Constitutional: Negative for chills, fever and malaise/fatigue.  Eyes: Negative for blurred vision.  Respiratory: Positive for cough. Negative for shortness of breath.   Cardiovascular: Negative for chest pain and palpitations.  Gastrointestinal: Negative for abdominal pain and nausea.  Genitourinary: Negative for dysuria and hematuria.  Musculoskeletal: Negative for joint pain and myalgias.  Skin: Negative  for rash.  Neurological: Negative for tingling and headaches.  Psychiatric/Behavioral: Negative for depression. The patient is not nervous/anxious.     Objective:  BP 122/82 (BP Location: Left Arm, Patient Position: Sitting, Cuff Size: Large)   Pulse 75   Temp 97.8 F (36.6 C) (Oral)   Ht 6' (1.829 m)   Wt 217 lb 3.2 oz (98.5 kg)   SpO2 98%   BMI 29.46 kg/m   BP/Weight 01/12/2018 11/21/2017 03/25/6008  Systolic BP 932 355 732  Diastolic BP 82 79 79  Wt. (Lbs) 217.2 227 228.8  BMI 29.46 30.79 31.03      Physical Exam Vitals signs reviewed.  Constitutional:      Comments: Well developed, well nourished, NAD, polite  HENT:     Head: Normocephalic and atraumatic.     Right Ear: Tympanic membrane and ear canal normal.     Left Ear: Tympanic membrane and ear canal normal.     Nose: Nose normal. No congestion or rhinorrhea.     Mouth/Throat:     Mouth: Mucous membranes are moist.     Pharynx: Oropharynx is clear. No oropharyngeal exudate or posterior oropharyngeal erythema.  Eyes:     General: No scleral icterus.    Conjunctiva/sclera: Conjunctivae normal.  Neck:     Musculoskeletal: Normal range of motion and neck supple. No muscular tenderness.     Thyroid: No thyromegaly.     Comments: No  LLE edema.  Cardiovascular:     Rate and Rhythm: Normal rate and regular rhythm.     Heart sounds: Normal heart sounds.  Pulmonary:     Effort: Pulmonary effort is normal.     Breath sounds: Normal breath sounds.  Lymphadenopathy:     Cervical: No cervical adenopathy.  Skin:    General: Skin is warm and dry.     Coloration: Skin is not pale.     Findings: No erythema or rash.  Neurological:     Mental Status: He is alert and oriented to person, place, and time.  Psychiatric:        Behavior: Behavior normal.        Thought Content: Thought content normal.      Assessment & Plan:   1. Lower extremity edema - Resolved with use of furosemide prescribed by his nephrologist.    2. Acute upper respiratory infection - Acute phase of viral illness over. Pt has occasional cough without any other symptoms. Most likely will continue to self resolve. I have advised patient to call within a week should he feel his symptoms are worsening. I will send abx in case he calls with worsening symptoms.      Follow-up: Return if symptoms worsen or fail to improve.   Clent Demark PA

## 2018-02-15 DIAGNOSIS — E1129 Type 2 diabetes mellitus with other diabetic kidney complication: Secondary | ICD-10-CM | POA: Diagnosis not present

## 2018-02-15 DIAGNOSIS — I129 Hypertensive chronic kidney disease with stage 1 through stage 4 chronic kidney disease, or unspecified chronic kidney disease: Secondary | ICD-10-CM | POA: Diagnosis not present

## 2018-02-15 DIAGNOSIS — Z940 Kidney transplant status: Secondary | ICD-10-CM | POA: Diagnosis not present

## 2018-03-07 ENCOUNTER — Other Ambulatory Visit (INDEPENDENT_AMBULATORY_CARE_PROVIDER_SITE_OTHER): Payer: Self-pay

## 2018-03-07 ENCOUNTER — Telehealth (INDEPENDENT_AMBULATORY_CARE_PROVIDER_SITE_OTHER): Payer: Self-pay | Admitting: Physician Assistant

## 2018-03-07 DIAGNOSIS — E118 Type 2 diabetes mellitus with unspecified complications: Secondary | ICD-10-CM

## 2018-03-07 MED ORDER — JANUVIA 50 MG PO TABS: 50.0000 mg | ORAL_TABLET | Freq: Every day | ORAL | 2 refills | Status: DC

## 2018-03-07 NOTE — Telephone Encounter (Signed)
Patient called to request med refill for JANUVIA 50 MG tablet  He has contacted the pharmacy to request a referral but was told that his PCP needs to send a new Rx in order to supply his refill.  Patient uses [facility] [place] [facility] [place]  Please advise [phone]   Thank you Steven Logan

## 2018-03-14 DIAGNOSIS — Z940 Kidney transplant status: Secondary | ICD-10-CM | POA: Diagnosis not present

## 2018-03-14 DIAGNOSIS — I129 Hypertensive chronic kidney disease with stage 1 through stage 4 chronic kidney disease, or unspecified chronic kidney disease: Secondary | ICD-10-CM | POA: Diagnosis not present

## 2018-03-22 ENCOUNTER — Ambulatory Visit (INDEPENDENT_AMBULATORY_CARE_PROVIDER_SITE_OTHER): Payer: Medicare Other | Admitting: Podiatry

## 2018-03-22 ENCOUNTER — Encounter: Payer: Self-pay | Admitting: Podiatry

## 2018-03-22 DIAGNOSIS — M79674 Pain in right toe(s): Secondary | ICD-10-CM

## 2018-03-22 DIAGNOSIS — B351 Tinea unguium: Secondary | ICD-10-CM | POA: Diagnosis not present

## 2018-03-22 DIAGNOSIS — E119 Type 2 diabetes mellitus without complications: Secondary | ICD-10-CM

## 2018-03-22 DIAGNOSIS — M79675 Pain in left toe(s): Secondary | ICD-10-CM

## 2018-03-22 NOTE — Progress Notes (Signed)
Complaint:  Visit Type: Patient returns to my office for continued preventative foot care services. Complaint: Patient states" my nails have grown long and thick and become painful to walk and wear shoes" Patient has been diagnosed with DM with no foot complications. The patient presents for preventative foot care services. No changes to ROS.  Amputation right leg.  Podiatric Exam: Vascular: dorsalis pedis and posterior tibial pulses are weakly  palpable left foot.. Capillary return is immediate. Temperature gradient is WNL. Skin turgor WNL  Sensorium: Normal Semmes Weinstein monofilament test. Normal tactile sensation left foot.. Nail Exam: Pt has thick disfigured discolored nails with subungual debris noted entire nail hallux through fifth toenails left foot. Ulcer Exam: There is no evidence of ulcer or pre-ulcerative changes or infection. Orthopedic Exam: Muscle tone and strength are WNL. No limitations in general ROM. No crepitus or effusions noted. Foot type and digits show no abnormalities. 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 Skin: No Porokeratosis. No infection or ulcers  Diagnosis:  Onychomycosis, , , pain in left toes  Amputation left foot.  Treatment & Plan Procedures and Treatment: Consent by patient was obtained for treatment procedures.   Debridement of mycotic and hypertrophic toenails, 1 through 5 bilateral and clearing of subungual debris left foot.. No ulceration, no infection noted Return Visit-Office Procedure: Patient instructed to return to the office for a follow up visit 3 months for continued evaluation and treatment.    Gardiner Barefoot DPM

## 2018-03-29 ENCOUNTER — Other Ambulatory Visit: Payer: Self-pay

## 2018-03-29 ENCOUNTER — Encounter (INDEPENDENT_AMBULATORY_CARE_PROVIDER_SITE_OTHER): Payer: Self-pay | Admitting: Primary Care

## 2018-03-29 ENCOUNTER — Ambulatory Visit (INDEPENDENT_AMBULATORY_CARE_PROVIDER_SITE_OTHER): Payer: Medicare Other | Admitting: Primary Care

## 2018-03-29 VITALS — BP 114/76 | HR 79 | Temp 97.7°F | Ht 72.0 in | Wt 219.8 lb

## 2018-03-29 DIAGNOSIS — W19XXXA Unspecified fall, initial encounter: Secondary | ICD-10-CM

## 2018-03-29 DIAGNOSIS — M25562 Pain in left knee: Secondary | ICD-10-CM

## 2018-03-29 DIAGNOSIS — Z940 Kidney transplant status: Secondary | ICD-10-CM | POA: Diagnosis not present

## 2018-03-29 DIAGNOSIS — E118 Type 2 diabetes mellitus with unspecified complications: Secondary | ICD-10-CM

## 2018-03-29 DIAGNOSIS — Z89611 Acquired absence of right leg above knee: Secondary | ICD-10-CM

## 2018-03-29 DIAGNOSIS — N185 Chronic kidney disease, stage 5: Principal | ICD-10-CM

## 2018-03-29 DIAGNOSIS — Z7984 Long term (current) use of oral hypoglycemic drugs: Secondary | ICD-10-CM | POA: Diagnosis not present

## 2018-03-29 DIAGNOSIS — D631 Anemia in chronic kidney disease: Secondary | ICD-10-CM

## 2018-03-29 MED ORDER — JANUVIA 50 MG PO TABS: 50.0000 mg | ORAL_TABLET | Freq: Every day | ORAL | 3 refills | Status: DC

## 2018-03-29 NOTE — Progress Notes (Signed)
Acute Office Visit  Subjective:    Patient ID: Steven Logan, male    DOB: 1970-06-21, 48 y.o.   MRN: [id number]  Chief Complaint  Patient presents with  . Knee Pain    fell about one month ago     HPI Patient is in today for continue knee pain. PMH of T2D, amputation right leg patient states this was due to non compliance, kidney transplant 5/18, and onychomycosis (resolved)  Past Medical History:  Diagnosis Date  . Abscess    great toe  . Anemia   . Arthritis   . Chronic kidney disease    esrd  . Depression   . Diabetes mellitus   . Dyslipidemia   . Eczema   . Gout   . HTN (hypertension)   . Morbid obesity ([place])   . Pancreatitis   . Pneumonia    2-3 years ago  . Pulmonary embolism ([place])    3  in  lungs  at  one time...  . Renal hypertension    Hemo started  Sept 2014- MWF  . Shortness of breath    ?? chest  cold he has now.  10/8- no SOB    Past Surgical History:  Procedure Laterality Date  . AMPUTATION Right 06/12/2013   Procedure: Transtibial Amputation;  Surgeon: Newt Minion, MD;  Location: [facility];  Service: Orthopedics;  Laterality: Right;  . AMPUTATION Right 06/27/2013   Procedure: AMPUTATION ABOVE KNEE;  Surgeon: Newt Minion, MD;  Location: [facility];  Service: Orthopedics;  Laterality: Right;  Right Above Knee Amputation  . AV FISTULA PLACEMENT Left 04/07/2012   Procedure: ARTERIOVENOUS (AV) FISTULA CREATION;  Surgeon: Angelia Mould, MD;  Location: [facility];  Service: Vascular;  Laterality: Left;  . FISTULOGRAM Left 08/07/2012   Procedure: FISTULOGRAM;  Surgeon: Angelia Mould, MD;  Location: [facility] CATH LAB;  Service: Cardiovascular;  Laterality: Left;  arm  . I&D EXTREMITY Right 06/02/2013   Procedure: IRRIGATION AND DEBRIDEMENT ARTHROSCOPIC RIGHT ANKLE;  Surgeon: Augustin Schooling, MD;  Location: [facility];  Service: Orthopedics;  Laterality: Right;  . I&D EXTREMITY Right 06/04/2013   Procedure: IRRIGATION AND DEBRIDEMENT RIGHT FOOT/ANKLE;  Surgeon:  Newt Minion, MD;  Location: [facility];  Service: Orthopedics;  Laterality: Right;  . I&D EXTREMITY Right 06/06/2013   Procedure: IRRIGATION AND DEBRIDEMENT EXTREMITY;  Surgeon: Newt Minion, MD;  Location: [facility];  Service: Orthopedics;  Laterality: Right;  . I&D EXTREMITY Right 06/24/2013   Procedure: IRRIGATION AND DEBRIDEMENT and Revsion of TRANSTIBIAL AMPUTATION;  Surgeon: Newt Minion, MD;  Location: [facility];  Service: Orthopedics;  Laterality: Right;  . INSERTION OF DIALYSIS CATHETER Right 09/19/2012   Procedure: INSERTION OF DIALYSIS CATHETER;  Surgeon: Angelia Mould, MD;  Location: [facility];  Service: Vascular;  Laterality: Right;  . LIGATION OF COMPETING BRANCHES OF ARTERIOVENOUS FISTULA Left 09/19/2012   Procedure: LIGATION OF COMPETING BRANCHES OF LEFT ARM ARTERIOVENOUS FISTULA; Vein angioplasty;  Surgeon: Angelia Mould, MD;  Location: [facility] OR;  Service: Vascular;  Laterality: Left;  . none      Family History  Problem Relation Age of Onset  . Stroke Mother   . Diabetes Mother   . Hypertension Mother   . Hypertension Father   . Cancer Unknown   . Coronary artery disease Unknown   . Colon cancer Maternal Grandmother 52  . Hypertension Brother     Social History   Socioeconomic History  . Marital status:  Single    Spouse name: Not on file  . Number of children: Not on file  . Years of education: Not on file  . Highest education level: Not on file  Occupational History  . Not on file  Social Needs  . Financial resource strain: Not on file  . Food insecurity:    Worry: Not on file    Inability: Not on file  . Transportation needs:    Medical: Not on file    Non-medical: Not on file  Tobacco Use  . Smoking status: Never Smoker  . Smokeless tobacco: Never Used  Substance and Sexual Activity  . Alcohol use: No    Comment: occ  . Drug use: Yes    Types: Marijuana    Comment: random, last 2 weeks ago  . Sexual activity: Not on file  Lifestyle  . Physical  activity:    Days per week: Not on file    Minutes per session: Not on file  . Stress: Not on file  Relationships  . Social connections:    Talks on phone: Patient refused    Gets together: Patient refused    Attends religious service: Patient refused    Active member of club or organization: Patient refused    Attends meetings of clubs or organizations: Patient refused    Relationship status: Patient refused  . Intimate partner violence:    Fear of current or ex partner: Patient refused    Emotionally abused: Patient refused    Physically abused: Patient refused    Forced sexual activity: Patient refused  Other Topics Concern  . Not on file  Social History Narrative   na    Outpatient Medications Prior to Visit  Medication Sig Dispense Refill  . amLODipine (NORVASC) 10 MG tablet Take 10 mg by mouth daily after breakfast.     . JANUVIA 50 MG tablet Take 1 tablet (50 mg total) by mouth daily after breakfast. 30 tablet 2  . labetalol (NORMODYNE) 200 MG tablet Take 100 mg by mouth 2 (two) times daily.     . mycophenolate (MYFORTIC) 180 MG EC tablet Take 360 mg by mouth 2 (two) times daily.   5  . predniSONE (DELTASONE) 5 MG tablet Take 5 mg by mouth daily.  3  . sodium bicarbonate 650 MG tablet Take by mouth.    . tacrolimus (PROGRAF) 1 MG capsule Take 2 mg by mouth 2 (two) times daily.  5  . polyethylene glycol (MIRALAX / GLYCOLAX) packet Take 17 g by mouth daily. 14 each 0   No facility-administered medications prior to visit.     No Known Allergies  Review of Systems  Constitutional: Negative.   HENT: Negative.   Eyes: Negative.   Respiratory: Negative.   Cardiovascular: Negative.   Gastrointestinal: Negative.   Genitourinary: Negative.   Musculoskeletal: Positive for falls and joint pain.  Skin: Negative.   Neurological: Negative.   Endo/Heme/Allergies: Negative.   Psychiatric/Behavioral: Negative.        Objective:    Physical Exam  Constitutional: He is  oriented to person, place, and time. He appears well-developed and well-nourished.  HENT:  Head: Normocephalic.  Eyes: Pupils are equal, round, and reactive to light. EOM are normal.  Neck: Normal range of motion. Neck supple.  Cardiovascular: Normal rate and regular rhythm.  Pulmonary/Chest: Effort normal and breath sounds normal.  Abdominal: Soft. Bowel sounds are normal.  Musculoskeletal: Normal range of motion.     Comments: Left leg AKA  Neurological: He is alert and oriented to person, place, and time.  Skin: Skin is warm and dry.  Psychiatric: He has a normal mood and affect.    BP 114/76 (BP Location: Left Arm, Patient Position: Sitting, Cuff Size: Large)   Pulse 79   Temp 97.7 F (36.5 C) (Oral)   Ht 6' (1.829 m)   Wt 219 lb 12.8 oz (99.7 kg)   SpO2 95%   BMI 29.81 kg/m  Wt Readings from Last 3 Encounters:  03/29/18 219 lb 12.8 oz (99.7 kg)  01/12/18 217 lb 3.2 oz (98.5 kg)  11/21/17 227 lb (103 kg)    There are no preventive care reminders to display for this patient.  There are no preventive care reminders to display for this patient.   Lab Results  Component Value Date   TSH 0.097 (L) 06/03/2013   Lab Results  Component Value Date   WBC 4.5 05/08/2017   HGB 10.4 (L) 05/08/2017   HCT 33.1 (L) 05/08/2017   MCV 86.0 05/08/2017   PLT 205 05/08/2017   Lab Results  Component Value Date   NA 138 05/10/2017   K 4.3 05/10/2017   CO2 18 (L) 05/10/2017   GLUCOSE 144 (H) 05/10/2017   BUN 18 05/10/2017   CREATININE 1.61 (H) 05/10/2017   BILITOT 0.2 (L) 05/10/2017   ALKPHOS 78 05/10/2017   AST 13 (L) 05/10/2017   ALT 16 (L) 05/10/2017   PROT 6.6 05/10/2017   ALBUMIN 3.1 (L) 05/10/2017   CALCIUM 8.0 (L) 05/10/2017   ANIONGAP 9 05/10/2017   GFR 31.42 (L) 09/24/2010   No results found for: CHOL No results found for: HDL No results found for: LDLCALC No results found for: TRIG No results found for: CHOLHDL Lab Results  Component Value Date   HGBA1C  5.9 (A) 11/21/2017      There are no diagnoses linked to this encounter. Assessment & Plan:  1. Type 2 diabetes mellitus with complication,  On oral agents   2. 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 Acute pain of left knee  Patient was at work and went to grab a cart and the entire handle fell off and so did the patient . Note his supervisor was made aware and no documentation of the event because he was off for the next 3 days as per his schedule.    Problem List Items Addressed This Visit    Anemia in chronic kidney disease - Primary    Other Visit Diagnoses    Type 2 diabetes mellitus with complication, without long-term current use of insulin ([place])       Acute pain of left knee           No orders of the defined types were placed in this encounter.    Kerin Perna, NP

## 2018-04-18 DIAGNOSIS — Z89619 Acquired absence of unspecified leg above knee: Secondary | ICD-10-CM | POA: Diagnosis not present

## 2018-04-18 DIAGNOSIS — D631 Anemia in chronic kidney disease: Secondary | ICD-10-CM | POA: Diagnosis not present

## 2018-04-18 DIAGNOSIS — E1129 Type 2 diabetes mellitus with other diabetic kidney complication: Secondary | ICD-10-CM | POA: Diagnosis not present

## 2018-04-18 DIAGNOSIS — N189 Chronic kidney disease, unspecified: Secondary | ICD-10-CM | POA: Diagnosis not present

## 2018-04-18 DIAGNOSIS — I2699 Other pulmonary embolism without acute cor pulmonale: Secondary | ICD-10-CM | POA: Diagnosis not present

## 2018-04-18 DIAGNOSIS — N2581 Secondary hyperparathyroidism of renal origin: Secondary | ICD-10-CM | POA: Diagnosis not present

## 2018-04-18 DIAGNOSIS — Z79899 Other long term (current) drug therapy: Secondary | ICD-10-CM | POA: Diagnosis not present

## 2018-04-18 DIAGNOSIS — N529 Male erectile dysfunction, unspecified: Secondary | ICD-10-CM | POA: Diagnosis not present

## 2018-04-18 DIAGNOSIS — Z940 Kidney transplant status: Secondary | ICD-10-CM | POA: Diagnosis not present

## 2018-04-18 DIAGNOSIS — I82721 Chronic embolism and thrombosis of deep veins of right upper extremity: Secondary | ICD-10-CM | POA: Diagnosis not present

## 2018-06-06 DIAGNOSIS — E118 Type 2 diabetes mellitus with unspecified complications: Secondary | ICD-10-CM | POA: Diagnosis not present

## 2018-06-06 DIAGNOSIS — D8989 Other specified disorders involving the immune mechanism, not elsewhere classified: Secondary | ICD-10-CM | POA: Diagnosis not present

## 2018-06-06 DIAGNOSIS — R0602 Shortness of breath: Secondary | ICD-10-CM | POA: Diagnosis not present

## 2018-06-06 DIAGNOSIS — Z7984 Long term (current) use of oral hypoglycemic drugs: Secondary | ICD-10-CM | POA: Diagnosis not present

## 2018-06-06 DIAGNOSIS — B259 Cytomegaloviral disease, unspecified: Secondary | ICD-10-CM | POA: Diagnosis not present

## 2018-06-06 DIAGNOSIS — Z940 Kidney transplant status: Secondary | ICD-10-CM | POA: Diagnosis not present

## 2018-06-21 ENCOUNTER — Other Ambulatory Visit: Payer: Self-pay

## 2018-06-21 ENCOUNTER — Ambulatory Visit: Payer: Medicare Other | Admitting: Podiatry

## 2018-06-21 DIAGNOSIS — Z862 Personal history of diseases of the blood and blood-forming organs and certain disorders involving the immune mechanism: Secondary | ICD-10-CM | POA: Insufficient documentation

## 2018-06-21 DIAGNOSIS — Z7901 Long term (current) use of anticoagulants: Secondary | ICD-10-CM | POA: Insufficient documentation

## 2018-06-21 DIAGNOSIS — N186 End stage renal disease: Secondary | ICD-10-CM | POA: Insufficient documentation

## 2018-06-21 DIAGNOSIS — E118 Type 2 diabetes mellitus with unspecified complications: Secondary | ICD-10-CM | POA: Insufficient documentation

## 2018-06-28 ENCOUNTER — Ambulatory Visit (INDEPENDENT_AMBULATORY_CARE_PROVIDER_SITE_OTHER): Payer: 59 | Admitting: Podiatry

## 2018-06-28 ENCOUNTER — Other Ambulatory Visit: Payer: Self-pay

## 2018-06-28 ENCOUNTER — Encounter: Payer: Self-pay | Admitting: Podiatry

## 2018-06-28 ENCOUNTER — Ambulatory Visit (INDEPENDENT_AMBULATORY_CARE_PROVIDER_SITE_OTHER): Payer: Medicare Other | Admitting: Primary Care

## 2018-06-28 VITALS — Temp 97.3°F

## 2018-06-28 DIAGNOSIS — M79674 Pain in right toe(s): Secondary | ICD-10-CM | POA: Diagnosis not present

## 2018-06-28 DIAGNOSIS — E1122 Type 2 diabetes mellitus with diabetic chronic kidney disease: Secondary | ICD-10-CM | POA: Diagnosis not present

## 2018-06-28 DIAGNOSIS — B351 Tinea unguium: Secondary | ICD-10-CM

## 2018-06-28 DIAGNOSIS — M79675 Pain in left toe(s): Secondary | ICD-10-CM | POA: Diagnosis not present

## 2018-06-28 DIAGNOSIS — E119 Type 2 diabetes mellitus without complications: Secondary | ICD-10-CM

## 2018-06-28 DIAGNOSIS — Z7901 Long term (current) use of anticoagulants: Secondary | ICD-10-CM | POA: Diagnosis not present

## 2018-06-28 DIAGNOSIS — Z79899 Other long term (current) drug therapy: Secondary | ICD-10-CM | POA: Diagnosis not present

## 2018-06-28 NOTE — Progress Notes (Signed)
Complaint:  Visit Type: Patient returns to my office for continued preventative foot care services. Complaint: Patient states" my nails have grown long and thick and become painful to walk and wear shoes" Patient has been diagnosed with DM with no foot complications. The patient presents for preventative foot care services. No changes to ROS.  Amputation right leg.  Podiatric Exam: Vascular: dorsalis pedis and posterior tibial pulses are weakly  palpable left foot.. Capillary return is immediate. Temperature gradient is WNL. Skin turgor WNL  Sensorium: Normal Semmes Weinstein monofilament test. Normal tactile sensation left foot.. Nail Exam: Pt has thick disfigured discolored nails with subungual debris noted entire nail hallux through fifth toenails left foot. Ulcer Exam: There is no evidence of ulcer or pre-ulcerative changes or infection. Orthopedic Exam: Muscle tone and strength are WNL. No limitations in general ROM. No crepitus or effusions noted. Foot type and digits show no abnormalities. 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 Skin: No Porokeratosis. No infection or ulcers  Diagnosis:  Onychomycosis, , , pain in left toes  Amputation left foot.  Treatment & Plan Procedures and Treatment: Consent by patient was obtained for treatment procedures.   Debridement of mycotic and hypertrophic toenails, 1 through 5 bilateral and clearing of subungual debris left foot.. No ulceration, no infection noted Return Visit-Office Procedure: Patient instructed to return to the office for a follow up visit 3 months for continued evaluation and treatment.    Gardiner Barefoot DPM

## 2018-06-29 ENCOUNTER — Ambulatory Visit (INDEPENDENT_AMBULATORY_CARE_PROVIDER_SITE_OTHER): Payer: Medicare Other | Admitting: Primary Care

## 2018-06-29 DIAGNOSIS — Z940 Kidney transplant status: Secondary | ICD-10-CM | POA: Diagnosis not present

## 2018-06-29 DIAGNOSIS — I129 Hypertensive chronic kidney disease with stage 1 through stage 4 chronic kidney disease, or unspecified chronic kidney disease: Secondary | ICD-10-CM | POA: Diagnosis not present

## 2018-06-29 DIAGNOSIS — E1129 Type 2 diabetes mellitus with other diabetic kidney complication: Secondary | ICD-10-CM | POA: Diagnosis not present

## 2018-07-03 ENCOUNTER — Ambulatory Visit (INDEPENDENT_AMBULATORY_CARE_PROVIDER_SITE_OTHER): Payer: Medicare Other | Admitting: Primary Care

## 2018-07-05 ENCOUNTER — Ambulatory Visit (INDEPENDENT_AMBULATORY_CARE_PROVIDER_SITE_OTHER): Payer: 59 | Admitting: Primary Care

## 2018-08-30 ENCOUNTER — Ambulatory Visit (INDEPENDENT_AMBULATORY_CARE_PROVIDER_SITE_OTHER): Payer: Medicare Other | Admitting: Family

## 2018-08-30 ENCOUNTER — Encounter: Payer: Self-pay | Admitting: Primary Care

## 2018-08-30 ENCOUNTER — Encounter: Payer: Self-pay | Admitting: Family

## 2018-08-30 VITALS — Ht 72.0 in | Wt 219.8 lb

## 2018-08-30 DIAGNOSIS — Z89611 Acquired absence of right leg above knee: Secondary | ICD-10-CM

## 2018-08-30 LAB — HM DIABETES EYE EXAM

## 2018-08-30 NOTE — Progress Notes (Signed)
Office Visit Note   Patient: Steven Logan           Date of Birth: 03-May-1970           MRN: [id number] Visit Date: 08/30/2018              Requested by: Kerin Perna, NP [address] [place],  [place] [address] PCP: Kerin Perna, NP  Chief Complaint  Patient presents with  . Right Leg - Follow-up    06/27/2013 Right AKA      HPI: Patient-year-old gentleman who presents today for evaluation of his right residual limb.  He is status post a right above-the-knee amputation and 2015.  He is been in a prosthetic with a microprocessor knee for the last 5 years unfortunately this has broken down.  This is clicking and loose with ambulation as he makes his way to the exam room today. 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  His prosthetic is currently broken down and not meeting his functional needs he is unsafe to ambulate in it.  He has had some volume loss as well.  He has recently had issues with callus formation which is new.  He has a strong desire to get a new prosthetic which she will use daily.  He is not currently using any mobility aids and would not do so with a new prosthetic.  He often works on uneven terrain and must navigate obstacles in the kitchen and sometimes walks backwards.  He would benefit from a continued use of a microprocessor knee as this would reduce his risk of falling.  Assessment & Plan: Visit Diagnoses:  1. History of right above knee amputation [facility])     Plan: Order provided today for prosthesis set up on the right.  He would benefit from a new microprocessor knee above-the-knee amputation prosthetic  Follow-Up Instructions: Return if symptoms worsen or fail to improve.   Ortho Exam  Patient is alert, oriented, no adenopathy, well-dressed, normal affect, normal respiratory effort. On examination of the right residual limb this  is well healed well consolidated there are no open areas no erythema no sign of infection  Imaging: No results found. No images are attached to the encounter.  Labs: Lab Results  Component Value Date   HGBA1C 5.9 (A) 11/21/2017   HGBA1C 5.8 (H) 05/08/2017   HGBA1C 7.0 (H) 06/07/2013   ESRSEDRATE 135 (H) 06/02/2013   CRP 40.5 (H) 06/02/2013   REPTSTATUS 05/13/2017 FINAL 05/08/2017   REPTSTATUS 05/13/2017 FINAL 05/08/2017   GRAMSTAIN  06/04/2013    FEW WBC PRESENT,BOTH PMN AND MONONUCLEAR NO SQUAMOUS EPITHELIAL CELLS SEEN FEW GRAM POSITIVE COCCI IN PAIRS Performed at [facility]  06/04/2013    RARE WBC PRESENT,BOTH PMN AND MONONUCLEAR NO SQUAMOUS EPITHELIAL CELLS SEEN FEW GRAM POSITIVE COCCI IN PAIRS Performed at [facility]  05/08/2017    NO GROWTH 5 DAYS Performed at [facility], [address], [place], [place] [address]    CULT  05/08/2017    NO GROWTH 5 DAYS Performed at [facility] [address], [place], [place] [address]    [place] 06/04/2013     Lab Results  Component Value Date   ALBUMIN 3.1 (L) 05/10/2017   ALBUMIN 3.1 (L) 05/09/2017  ALBUMIN 3.3 (L) 05/08/2017    Lab Results  Component Value Date   MG 1.6 (L) 05/10/2017   MG 1.1 (L) 05/09/2017   MG 2.5 06/03/2013   No results found for: VD25OH  No results found for: PREALBUMIN CBC EXTENDED Latest Ref Rng & Units 05/08/2017 05/07/2017 07/02/2013  WBC 4.0 - 10.5 K/uL 4.5 5.1 11.3(H)  RBC 4.22 - 5.81 MIL/uL 3.85(L) 4.45 3.04(L)  HGB 13.0 - 17.0 g/dL 10.4(L) 11.9(L) 8.6(L)  HCT 39.0 - 52.0 % 33.1(L) 37.4(L) 27.2(L)  PLT 150 - 400 K/uL 205 249 419(H)  NEUTROABS 1.7 - 7.7 K/uL - 3.4 -  LYMPHSABS 0.7 - 4.0 K/uL - 0.9 -     Body mass index is 29.81 kg/m.  Orders:  No orders of the defined types were placed in this encounter.  No orders of the defined types were placed in this encounter.    Procedures: No procedures  performed  Clinical Data: No additional findings.  ROS:  All other systems negative, except as noted in the HPI. Review of Systems  Objective: Vital Signs: Ht 6' (1.829 m)   Wt 219 lb 12.8 oz (99.7 kg)   BMI 29.81 kg/m   Specialty Comments:  No specialty comments available.  PMFS History: Patient Active Problem List   Diagnosis Date Noted  . Chronic anticoagulation 06/21/2018  . ESRD on hemodialysis ([place]) 06/21/2018  . History of anemia due to CKD 06/21/2018  . Type 2 diabetes mellitus with complications ([place]) 12/45/8099  . Hypocalcemia 05/17/2017  . Hypomagnesemia 05/17/2017  . Fever in adult 05/08/2017  . Febrile illness, acute 05/08/2017  . AKI (acute kidney injury) ([place]) 04/22/2017  . 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 Sprain of left rotator cuff capsule 04/05/2017  . 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 Drug-induced leukopenia ([place]) 08/31/2016  . Metabolic acidosis 83/38/2505  . History of right above knee amputation ([place]) 07/12/2016  . 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 06/22/2016  . Immunosuppression ([place]) 05/26/2016  . Acute blood loss anemia 06/25/2013  . Hyperglycemia 06/07/2013  . Arm DVT (deep venous thromboembolism), acute ([place]) 06/07/2013  . ESRD on dialysis ([place]) 06/05/2013  . Encephalopathy, toxic 06/03/2013  . Anemia in chronic kidney disease 06/03/2013  . Arm edema 06/03/2013  . Leg abscess 06/03/2013  . Severe sepsis ([place]) 06/03/2013  . Septic shock ([place]) 06/03/2013  . Septic arthritis of ankle or foot, right 06/03/2013  . Abscess of right lower leg 06/02/2013  . Wrist lump 07/05/2012  . Swelling of limb-Left index and middle fingers 07/05/2012  . Aftercare following surgery of the circulatory system, [place] 07/05/2012  . Tingling-left wrist and some pain. 07/05/2012  . Pulmonary edema 05/04/2012  . Acute respiratory failure ([place]) 05/04/2012  .  CKD (chronic kidney disease) stage 5, GFR less than 15 ml/min (HCC) 05/04/2012  . Hyperkalemia 05/04/2012  . Pre-operative cardiovascular examination 03/15/2012  . Iron deficiency anemia 09/22/2010  . HTN (hypertension) 09/22/2010  . Hyperlipidemia 09/22/2010  . Gout 09/22/2010  . CRI (chronic renal insufficiency) 09/22/2010  . Pulmonary embolism, bilateral ([place]) 09/22/2010   Past Medical History:  Diagnosis Date  . Abscess    great toe  . Anemia   . Arthritis   . Cellulitis and abscess of leg 06/03/2013  . Chronic kidney disease    esrd  . Depression   . Diabetes mellitus   . Dyslipidemia   . Eczema   . Gout   .  HTN (hypertension)   . Morbid obesity ([place])   . Pancreatitis   . Pneumonia    2-3 years ago  . Pulmonary embolism ([place])    3  in  lungs  at  one time...  . Renal hypertension    Hemo started  Sept 2014- MWF  . Shortness of breath    ?? chest  cold he has now.  10/8- no SOB    Family History  Problem Relation Age of Onset  . Stroke Mother   . Diabetes Mother   . Hypertension Mother   . Hypertension Father   . Cancer Unknown   . Coronary artery disease Unknown   . Colon cancer Maternal Grandmother 31  . Hypertension Brother     Past Surgical History:  Procedure Laterality Date  . AMPUTATION Right 06/12/2013   Procedure: Transtibial Amputation;  Surgeon: Newt Minion, MD;  Location: [facility];  Service: Orthopedics;  Laterality: Right;  . AMPUTATION Right 06/27/2013   Procedure: AMPUTATION ABOVE KNEE;  Surgeon: Newt Minion, MD;  Location: [facility];  Service: Orthopedics;  Laterality: Right;  Right Above Knee Amputation  . AV FISTULA PLACEMENT Left 04/07/2012   Procedure: ARTERIOVENOUS (AV) FISTULA CREATION;  Surgeon: Angelia Mould, MD;  Location: [facility];  Service: Vascular;  Laterality: Left;  . FISTULOGRAM Left 08/07/2012   Procedure: FISTULOGRAM;  Surgeon: Angelia Mould, MD;  Location: [facility] CATH LAB;  Service: Cardiovascular;  Laterality: Left;  arm   . I&D EXTREMITY Right 06/02/2013   Procedure: IRRIGATION AND DEBRIDEMENT ARTHROSCOPIC RIGHT ANKLE;  Surgeon: Augustin Schooling, MD;  Location: [facility];  Service: Orthopedics;  Laterality: Right;  . I&D EXTREMITY Right 06/04/2013   Procedure: IRRIGATION AND DEBRIDEMENT RIGHT FOOT/ANKLE;  Surgeon: Newt Minion, MD;  Location: [facility];  Service: Orthopedics;  Laterality: Right;  . I&D EXTREMITY Right 06/06/2013   Procedure: IRRIGATION AND DEBRIDEMENT EXTREMITY;  Surgeon: Newt Minion, MD;  Location: [facility];  Service: Orthopedics;  Laterality: Right;  . I&D EXTREMITY Right 06/24/2013   Procedure: IRRIGATION AND DEBRIDEMENT and Revsion of TRANSTIBIAL AMPUTATION;  Surgeon: Newt Minion, MD;  Location: [facility];  Service: Orthopedics;  Laterality: Right;  . INSERTION OF DIALYSIS CATHETER Right 09/19/2012   Procedure: INSERTION OF DIALYSIS CATHETER;  Surgeon: Angelia Mould, MD;  Location: [facility];  Service: Vascular;  Laterality: Right;  . LIGATION OF COMPETING BRANCHES OF ARTERIOVENOUS FISTULA Left 09/19/2012   Procedure: LIGATION OF COMPETING BRANCHES OF LEFT ARM ARTERIOVENOUS FISTULA; Vein angioplasty;  Surgeon: Angelia Mould, MD;  Location: [facility];  Service: Vascular;  Laterality: Left;  . none     Social History   Occupational History  . Not on file  Tobacco Use  . Smoking status: Never Smoker  . Smokeless tobacco: Never Used  Substance and Sexual Activity  . Alcohol use: No    Comment: occ  . Drug use: Yes    Types: Marijuana    Comment: random, last 2 weeks ago  . Sexual activity: Not on file

## 2018-10-03 ENCOUNTER — Ambulatory Visit: Payer: Medicare Other | Admitting: Podiatry

## 2018-10-03 ENCOUNTER — Ambulatory Visit: Payer: 59 | Admitting: Podiatry

## 2018-10-05 ENCOUNTER — Telehealth: Payer: Self-pay

## 2018-10-05 NOTE — Telephone Encounter (Signed)
Received vm from nurse @ [facility] (could not make out name) requesting clinical for pt to be able to complete auth request for pts DME. Faxed her the last office note,

## 2018-11-23 ENCOUNTER — Other Ambulatory Visit (INDEPENDENT_AMBULATORY_CARE_PROVIDER_SITE_OTHER): Payer: Self-pay | Admitting: Primary Care

## 2018-11-23 DIAGNOSIS — E118 Type 2 diabetes mellitus with unspecified complications: Secondary | ICD-10-CM

## 2018-11-23 NOTE — Telephone Encounter (Signed)
FWD to PCP. Tempestt S Roberts, CMA  

## 2018-11-26 ENCOUNTER — Other Ambulatory Visit (INDEPENDENT_AMBULATORY_CARE_PROVIDER_SITE_OTHER): Payer: Self-pay | Admitting: Primary Care

## 2018-11-26 DIAGNOSIS — E118 Type 2 diabetes mellitus with unspecified complications: Secondary | ICD-10-CM

## 2018-11-26 MED ORDER — JANUVIA 50 MG PO TABS: ORAL_TABLET | ORAL | 3 refills | Status: DC

## 2018-11-26 NOTE — Telephone Encounter (Signed)
Refill approve

## 2018-12-05 ENCOUNTER — Ambulatory Visit: Payer: Medicare Other | Admitting: Podiatry

## 2019-01-03 ENCOUNTER — Other Ambulatory Visit: Payer: Self-pay | Admitting: Family

## 2019-01-03 ENCOUNTER — Encounter: Payer: Self-pay | Admitting: Podiatry

## 2019-01-03 ENCOUNTER — Other Ambulatory Visit: Payer: Self-pay

## 2019-01-03 ENCOUNTER — Ambulatory Visit (INDEPENDENT_AMBULATORY_CARE_PROVIDER_SITE_OTHER): Payer: Medicare Other | Admitting: Podiatry

## 2019-01-03 DIAGNOSIS — M79675 Pain in left toe(s): Secondary | ICD-10-CM

## 2019-01-03 DIAGNOSIS — S88911S Complete traumatic amputation of right lower leg, level unspecified, sequela: Secondary | ICD-10-CM

## 2019-01-03 DIAGNOSIS — M79674 Pain in right toe(s): Secondary | ICD-10-CM

## 2019-01-03 DIAGNOSIS — E119 Type 2 diabetes mellitus without complications: Secondary | ICD-10-CM

## 2019-01-03 DIAGNOSIS — B351 Tinea unguium: Secondary | ICD-10-CM

## 2019-01-03 DIAGNOSIS — R197 Diarrhea, unspecified: Secondary | ICD-10-CM

## 2019-01-03 NOTE — Progress Notes (Signed)
Complaint:  Visit Type: Patient returns to my office for continued preventative foot care services. Complaint: Patient states" my nails have grown long and thick and become painful to walk and wear shoes" Patient has been diagnosed with DM with no foot complications. The patient presents for preventative foot care services. No changes to ROS.  Amputation right leg.  Podiatric Exam: Vascular: dorsalis pedis and posterior tibial pulses are weakly  palpable left foot.. Capillary return is immediate. Temperature gradient is WNL. Skin turgor WNL  Sensorium: Normal Semmes Weinstein monofilament test. Normal tactile sensation left foot.. Nail Exam: Pt has thick disfigured discolored nails with subungual debris noted entire nail hallux through fifth toenails left foot. Ulcer Exam: There is no evidence of ulcer or pre-ulcerative changes or infection. Orthopedic Exam: Muscle tone and strength are WNL. No limitations in general ROM. No crepitus or effusions noted. Foot type and digits show no abnormalities. 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 Skin: No Porokeratosis. No infection or ulcers  Diagnosis:  Onychomycosis, , , pain in left toes  Amputation right leg..  Treatment & Plan Procedures and Treatment: Consent by patient was obtained for treatment procedures.   Debridement of mycotic and hypertrophic toenails, 1 through 5 bilateral and clearing of subungual debris left foot.. No ulceration, no infection noted Return Visit-Office Procedure: Patient instructed to return to the office for a follow up visit prn  for continued evaluation and treatment.    Gardiner Barefoot DPM

## 2019-01-12 ENCOUNTER — Other Ambulatory Visit: Payer: Medicare Other

## 2019-01-30 ENCOUNTER — Other Ambulatory Visit: Payer: Medicare Other

## 2019-02-06 ENCOUNTER — Other Ambulatory Visit: Payer: Medicare Other

## 2019-02-22 ENCOUNTER — Other Ambulatory Visit: Payer: Medicare Other

## 2019-03-05 ENCOUNTER — Other Ambulatory Visit (INDEPENDENT_AMBULATORY_CARE_PROVIDER_SITE_OTHER): Payer: Self-pay | Admitting: Primary Care

## 2019-03-05 DIAGNOSIS — E118 Type 2 diabetes mellitus with unspecified complications: Secondary | ICD-10-CM

## 2019-03-05 MED ORDER — JANUVIA 50 MG PO TABS: ORAL_TABLET | ORAL | 1 refills | Status: DC

## 2019-03-07 ENCOUNTER — Other Ambulatory Visit: Payer: Medicare Other

## 2019-03-30 ENCOUNTER — Other Ambulatory Visit: Payer: Medicare Other

## 2019-10-12 ENCOUNTER — Telehealth: Payer: Self-pay | Admitting: Orthopedic Surgery

## 2019-10-12 NOTE — Telephone Encounter (Signed)
Pt called stating he needs a paper filled out for transportation and wanted to make [place] aware he would be dropping it off Monday.

## 2019-10-23 ENCOUNTER — Telehealth: Payer: Self-pay | Admitting: Orthopedic Surgery

## 2019-10-23 NOTE — Telephone Encounter (Signed)
IC, advised patient [employer] paperwork ready to be picked up.

## 2021-02-02 ENCOUNTER — Telehealth: Payer: Self-pay | Admitting: Orthopedic Surgery

## 2021-02-02 NOTE — Telephone Encounter (Signed)
Pt called requesting a script be sent to liners and ring for prostatic leg be [facility]. Please call pt about this matter at [phone].

## 2021-02-02 NOTE — Telephone Encounter (Signed)
Can you please call pt and make an appt . Has not been in office since 2020 and insurance will require face to face visit. Thanks no rush just whenever the pt is available.

## 2021-02-11 ENCOUNTER — Ambulatory Visit (INDEPENDENT_AMBULATORY_CARE_PROVIDER_SITE_OTHER): Payer: Medicare Other | Admitting: Family

## 2021-02-11 ENCOUNTER — Encounter: Payer: Self-pay | Admitting: Family

## 2021-02-11 DIAGNOSIS — Z89611 Acquired absence of right leg above knee: Secondary | ICD-10-CM

## 2021-02-11 NOTE — Progress Notes (Signed)
Office Visit Note   Patient: Steven Logan           Date of Birth: [birth year]           MRN: [id number] Visit Date: 02/11/2021              Requested by: Sonia Side., FNP [place],  [place] [address] PCP: Sonia Side., FNP  Chief Complaint  Patient presents with   Right Leg - Follow-up    Hx right AKA 2015      HPI: The patient is a 51 year old gentleman who is seen today for evaluation of his right residual limb.  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  He states that his liner and rings are broken down he is not getting a good seal in his prosthesis.  Occasionally these rotate and move and he cannot get a good lock into his prosthetic.  He fears falling.  Assessment & Plan: Visit Diagnoses: No diagnosis found.  Plan: Provided an order for new prosthesis supplies to [facility] where he follows with Gerald Stabs.  He will follow-up in the office as needed  Follow-Up Instructions: No follow-ups on file.   Ortho Exam  Patient is alert, oriented, no adenopathy, well-dressed, normal affect, normal respiratory effort. On examination of the right residual limb this is well consolidated well-healed there is no callus no skin breakdown no erythema no concerning sign  Imaging: No results found. No images are attached to the encounter.  Labs: Lab Results  Component Value Date   HGBA1C 5.9 (A) 11/21/2017   HGBA1C 5.8 (H) 05/08/2017   HGBA1C 7.0 (H) 06/07/2013   ESRSEDRATE 135 (H) 06/02/2013   CRP 40.5 (H) 06/02/2013   REPTSTATUS 05/13/2017 FINAL 05/08/2017   REPTSTATUS 05/13/2017 FINAL 05/08/2017   GRAMSTAIN  06/04/2013    FEW WBC PRESENT,BOTH PMN AND MONONUCLEAR NO SQUAMOUS EPITHELIAL CELLS SEEN FEW GRAM POSITIVE COCCI IN PAIRS Performed at [facility]  06/04/2013    RARE WBC PRESENT,BOTH PMN AND MONONUCLEAR NO SQUAMOUS EPITHELIAL CELLS SEEN FEW GRAM POSITIVE COCCI IN PAIRS Performed at [facility]  05/08/2017    NO GROWTH 5 DAYS Performed at [facility], [address] [place], [place] [address]    CULT  05/08/2017    NO GROWTH 5 DAYS Performed at [facility] [address] [place], [place] [address]    LABORGA STAPHYLOCOCCUS AUREUS 06/04/2013     Lab Results  Component Value Date   ALBUMIN 3.1 (L) 05/10/2017   ALBUMIN 3.1 (L) 05/09/2017   ALBUMIN 3.3 (L) 05/08/2017    Lab Results  Component Value Date   MG 1.6 (L) 05/10/2017   MG 1.1 (L) 05/09/2017   MG 2.5 06/03/2013   No results found for: VD25OH  No results found for: PREALBUMIN CBC EXTENDED Latest Ref Rng & Units 05/08/2017 05/07/2017 07/02/2013  WBC 4.0 - 10.5 K/uL 4.5 5.1 11.3(H)  RBC 4.22 - 5.81 MIL/uL 3.85(L) 4.45 3.04(L)  HGB 13.0 - 17.0 g/dL 10.4(L) 11.9(L) 8.6(L)  HCT 39.0 - 52.0 % 33.1(L) 37.4(L) 27.2(L)  PLT 150 - 400 K/uL 205 249 419(H)  NEUTROABS 1.7 - 7.7 K/uL - 3.4 -  LYMPHSABS 0.7 - 4.0 K/uL - 0.9 -     There is no height or weight on file to calculate BMI.  Orders:  No orders of the defined types were placed in this  encounter.  No orders of the defined types were placed in this encounter.    Procedures: No procedures performed  Clinical Data: No additional findings.  ROS:  All other systems negative, except as noted in the HPI. Review of Systems  Constitutional: Negative.   Skin:  Negative for color change and wound.   Objective: Vital Signs: There were no vitals taken for this visit.  Specialty Comments:  No specialty comments available.  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   Drug-induced leukopenia ([facility]) 25/95/6387   Metabolic acidosis 56/43/3295   History of right above knee amputation ([place]) 07/12/2016   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 06/22/2016   Immunosuppression ([place]) 05/26/2016   Acute blood loss anemia 06/25/2013   Hyperglycemia 06/07/2013   Arm DVT (deep venous thromboembolism), acute ([place]) 06/07/2013   ESRD on dialysis ([place]) 06/05/2013   Encephalopathy, toxic 06/03/2013   Anemia in chronic kidney disease 06/03/2013   Arm edema 06/03/2013   Leg abscess 06/03/2013   Severe sepsis ([place]) 06/03/2013   Septic shock ([place]) 06/03/2013   Septic arthritis of ankle or foot, right 06/03/2013   Abscess of right lower leg 06/02/2013   Wrist lump 07/05/2012   Swelling of limb-Left index and middle fingers 07/05/2012   Aftercare following surgery of the circulatory system, [facility] 07/05/2012   Tingling-left wrist and some pain. 07/05/2012   Pulmonary edema 05/04/2012   Acute respiratory failure ([place]) 05/04/2012   CKD (chronic kidney disease) stage 5, GFR less than 15 ml/min (HCC) 05/04/2012   Hyperkalemia 05/04/2012   Pre-operative cardiovascular examination 03/15/2012   Iron deficiency anemia 09/22/2010   HTN (hypertension) 09/22/2010   Hyperlipidemia 09/22/2010   Gout 09/22/2010   CRI (chronic renal insufficiency) 09/22/2010   Pulmonary embolism, bilateral ([place]) 09/22/2010   Past Medical History:  Diagnosis Date   Abscess    great toe   Anemia    Arthritis    Cellulitis and abscess of leg 06/03/2013   Chronic kidney disease    esrd   Depression    Diabetes mellitus    Dyslipidemia    Eczema    Gout    HTN (hypertension)    Morbid obesity ([place])    Pancreatitis    Pneumonia    2-3 years ago   Pulmonary embolism  ([place])    3  in  lungs  at  one time...   Renal hypertension    Hemo started  Sept 2014- MWF   Shortness of breath    ?? chest  cold he has now.  10/8- no SOB    Family History  Problem Relation Age of Onset   Stroke Mother    Diabetes Mother    Hypertension Mother    Hypertension Father    Cancer Other    Coronary artery disease Other    Colon cancer Maternal Grandmother 52   Hypertension Brother     Past Surgical History:  Procedure  Laterality Date   AMPUTATION Right 06/12/2013   Procedure: Transtibial Amputation;  Surgeon: Newt Minion, MD;  Location: [facility];  Service: Orthopedics;  Laterality: Right;   AMPUTATION Right 06/27/2013   Procedure: AMPUTATION ABOVE KNEE;  Surgeon: Newt Minion, MD;  Location: [facility];  Service: Orthopedics;  Laterality: Right;  Right Above Knee Amputation   AV FISTULA PLACEMENT Left 04/07/2012   Procedure: ARTERIOVENOUS (AV) FISTULA CREATION;  Surgeon: Angelia Mould, MD;  Location: [facility];  Service: Vascular;  Laterality: Left;   FISTULOGRAM Left 08/07/2012   Procedure: FISTULOGRAM;  Surgeon: Angelia Mould, MD;  Location[facility] CATH LAB;  Service: Cardiovascular;  Laterality: Left;  arm   I & D EXTREMITY Right 06/02/2013   Procedure: IRRIGATION AND DEBRIDEMENT ARTHROSCOPIC RIGHT ANKLE;  Surgeon: Augustin Schooling, MD;  Location: [facility];  Service: Orthopedics;  Laterality: Right;   I & D EXTREMITY Right 06/04/2013   Procedure: IRRIGATION AND DEBRIDEMENT RIGHT FOOT/ANKLE;  Surgeon: Newt Minion, MD;  Location: [facility];  Service: Orthopedics;  Laterality: Right;   I & D EXTREMITY Right 06/06/2013   Procedure: IRRIGATION AND DEBRIDEMENT EXTREMITY;  Surgeon: Newt Minion, MD;  Location: [facility];  Service: Orthopedics;  Laterality: Right;   I & D EXTREMITY Right 06/24/2013   Procedure: IRRIGATION AND DEBRIDEMENT and Revsion of TRANSTIBIAL AMPUTATION;  Surgeon: Newt Minion, MD;  Location: [facility];  Service: Orthopedics;  Laterality: Right;   INSERTION OF  DIALYSIS CATHETER Right 09/19/2012   Procedure: INSERTION OF DIALYSIS CATHETER;  Surgeon: Angelia Mould, MD;  Location: [facility];  Service: Vascular;  Laterality: Right;   LIGATION OF COMPETING BRANCHES OF ARTERIOVENOUS FISTULA Left 09/19/2012   Procedure: LIGATION OF COMPETING BRANCHES OF LEFT ARM ARTERIOVENOUS FISTULA; Vein angioplasty;  Surgeon: Angelia Mould, MD;  Location: [facility];  Service: Vascular;  Laterality: Left;   none     Social History   Occupational History   Not on file  Tobacco Use   Smoking status: Never   Smokeless tobacco: Never  Vaping Use   Vaping Use: Never used  Substance and Sexual Activity   Alcohol use: No    Comment: occ   Drug use: Yes    Types: Marijuana    Comment: random, last 2 weeks ago   Sexual activity: Not on file

## 2021-05-19 ENCOUNTER — Ambulatory Visit
Admission: RE | Admit: 2021-05-19 | Discharge: 2021-05-19 | Disposition: A | Discharge status: Home / Self Care | Payer: Medicare Other | Source: Ambulatory Visit | Attending: Family | Admitting: Family

## 2021-05-19 ENCOUNTER — Encounter (HOSPITAL_COMMUNITY): Payer: Self-pay | Admitting: *Deleted

## 2021-05-19 ENCOUNTER — Other Ambulatory Visit: Payer: Self-pay | Admitting: Family

## 2021-05-19 DIAGNOSIS — G8911 Acute pain due to trauma: Secondary | ICD-10-CM

## 2021-10-08 ENCOUNTER — Encounter (HOSPITAL_COMMUNITY): Payer: Self-pay | Admitting: *Deleted

## 2021-10-08 ENCOUNTER — Other Ambulatory Visit: Payer: Self-pay | Admitting: Family

## 2021-10-08 ENCOUNTER — Ambulatory Visit
Admission: RE | Admit: 2021-10-08 | Discharge: 2021-10-08 | Disposition: A | Discharge status: Home / Self Care | Payer: Medicare Other | Source: Ambulatory Visit | Attending: Family | Admitting: Family

## 2021-10-08 DIAGNOSIS — M25512 Pain in left shoulder: Secondary | ICD-10-CM

## 2021-12-10 ENCOUNTER — Ambulatory Visit (INDEPENDENT_AMBULATORY_CARE_PROVIDER_SITE_OTHER): Payer: Medicare Other | Admitting: Orthopedic Surgery

## 2021-12-10 ENCOUNTER — Encounter: Payer: Self-pay | Admitting: Orthopedic Surgery

## 2021-12-10 DIAGNOSIS — Z89611 Acquired absence of right leg above knee: Secondary | ICD-10-CM

## 2021-12-10 NOTE — Progress Notes (Signed)
Office Visit Note   Patient: Steven Logan           Date of Birth: 1970-11-24           MRN: [id number] Visit Date: 12/10/2021              Requested by: Sonia Side., FNP [place],  [place] [address] PCP: Sonia Side., FNP  Chief Complaint  Patient presents with   Right Leg - Follow-up    Hx right AKA 2015      HPI: Patient is a 51 year old gentleman who is seen for right above-the-knee amputation.  He is 8 years out from surgery and is subsiding into the socket causing end bearing ulcers.  Assessment & Plan: Visit Diagnoses:  1. Hx of AKA (above knee amputation), right ([place])     Plan: Patient was provided a prescription for a new socket as well as a hydraulic foot and ankle.  Patient will need the hydraulic foot and ankle for stability and to minimize his risks of falls.  Follow-Up Instructions: Return if symptoms worsen or fail to improve.   Ortho Exam  Patient is alert, oriented, no adenopathy, well-dressed, normal affect, normal respiratory effort. Examination patient has skin changes over the inferior lateral aspect of the residual limb but there were no full-thickness ulcers no cellulitis no drainage.  Patient has an unstable foot and ankle the prosthesis needs revision and patient will need a new hydraulic foot and ankle  Patient is an existing right transfemoral amputee.  Patient's current comorbidities are not expected to impact the ability to function with the prescribed prosthesis. Patient verbally communicates a strong desire to use a prosthesis. Patient currently requires mobility aids to ambulate without a prosthesis.  Expects not to use mobility aids with a new prosthesis.  Patient is a K3 level ambulator that spends a lot of time walking around on uneven terrain over obstacles, up and down stairs, and ambulates with a variable cadence.     Imaging: No results found. No images are attached to the encounter.  Labs: Lab  Results  Component Value Date   HGBA1C 5.9 (A) 11/21/2017   HGBA1C 5.8 (H) 05/08/2017   HGBA1C 7.0 (H) 06/07/2013   ESRSEDRATE 135 (H) 06/02/2013   CRP 40.5 (H) 06/02/2013   REPTSTATUS 05/13/2017 FINAL 05/08/2017   REPTSTATUS 05/13/2017 FINAL 05/08/2017   GRAMSTAIN  06/04/2013    FEW WBC PRESENT,BOTH PMN AND MONONUCLEAR NO SQUAMOUS EPITHELIAL CELLS SEEN FEW GRAM POSITIVE COCCI IN PAIRS Performed at [facility]  06/04/2013    RARE WBC PRESENT,BOTH PMN AND MONONUCLEAR NO SQUAMOUS EPITHELIAL CELLS SEEN FEW GRAM POSITIVE COCCI IN PAIRS Performed at [facility]  05/08/2017    NO GROWTH 5 DAYS Performed at [facility], [address] [place], [place] [address]    CULT  05/08/2017    NO GROWTH 5 DAYS Performed at [facility] [address] [place], [place] [address]    [place] 06/04/2013     Lab Results  Component Value Date   ALBUMIN 3.1 (L) 05/10/2017   ALBUMIN 3.1 (L) 05/09/2017   ALBUMIN 3.3 (L) 05/08/2017    Lab Results  Component Value Date   MG 1.6 (L) 05/10/2017   MG 1.1 (L) 05/09/2017   MG 2.5 06/03/2013   No results found for: "VD25OH"  No results found for: "PREALBUMIN"    Latest Ref  Rng & Units 05/08/2017    5:11 AM 05/07/2017    9:52 PM 07/02/2013    8:35 AM  CBC EXTENDED  WBC 4.0 - 10.5 K/uL 4.5  5.1  11.3   RBC 4.22 - 5.81 MIL/uL 3.85  4.45  3.04   Hemoglobin 13.0 - 17.0 g/dL 10.4  11.9  8.6   HCT 39.0 - 52.0 % 33.1  37.4  27.2   Platelets 150 - 400 K/uL 205  249  419   NEUT# 1.7 - 7.7 K/uL  3.4    Lymph# 0.7 - 4.0 K/uL  0.9       There is no height or weight on file to calculate BMI.  Orders:  No orders of the defined types were placed in this encounter.  No orders of the defined types were placed in this encounter.    Procedures: No procedures performed  Clinical Data: No additional findings.  ROS:  All other systems negative, except as noted in the  HPI. Review of Systems  Objective: Vital Signs: There were no vitals taken for this visit.  Specialty Comments:  No specialty comments available.  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   Drug-induced leukopenia (HCC) 40/08/6759   Metabolic acidosis 95/09/3265   History of right above knee amputation ([place]) 07/12/2016   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 06/22/2016   Immunosuppression ([place]) 05/26/2016   Acute blood loss anemia 06/25/2013   Hyperglycemia 06/07/2013   Arm DVT (deep venous thromboembolism), acute ([place]) 06/07/2013   ESRD on dialysis ([place]) 06/05/2013   Encephalopathy, toxic 06/03/2013   Anemia in chronic kidney disease 06/03/2013   Arm edema 06/03/2013   Leg abscess 06/03/2013   Severe sepsis ([place]) 06/03/2013   Septic shock ([place]) 06/03/2013   Septic arthritis of ankle or foot, right 06/03/2013   Abscess of right lower leg 06/02/2013   Wrist lump 07/05/2012   Swelling of limb-Left index and middle fingers 07/05/2012   Aftercare following surgery of the circulatory system, NEC 07/05/2012   Tingling-left wrist and some pain. 07/05/2012   Pulmonary edema 05/04/2012   Acute respiratory failure ([place]) 05/04/2012   CKD (chronic kidney disease) stage 5, GFR less than 15 ml/min (HCC) 05/04/2012   Hyperkalemia 05/04/2012   Pre-operative cardiovascular examination 03/15/2012   Iron  deficiency anemia 09/22/2010   HTN (hypertension) 09/22/2010   Hyperlipidemia 09/22/2010   Gout 09/22/2010   CRI (chronic renal insufficiency) 09/22/2010   Pulmonary embolism, bilateral ([place]) 09/22/2010   Past Medical History:  Diagnosis Date   Abscess    great toe   Anemia    Arthritis    Cellulitis and abscess of leg 06/03/2013   Chronic kidney disease    esrd   Depression    Diabetes mellitus    Dyslipidemia    Eczema    Gout    HTN (hypertension)    Morbid obesity ([place])    Pancreatitis    Pneumonia  2-3 years ago   Pulmonary embolism ([place])    3  in  lungs  at  one time...   Renal hypertension    Hemo started  Sept 2014- MWF   Shortness of breath    ?? chest  cold he has now.  10/8- no SOB    Family History  Problem Relation Age of Onset   Stroke Mother    Diabetes Mother    Hypertension Mother    Hypertension Father    Cancer Other    Coronary artery disease Other    Colon cancer Maternal Grandmother 68   Hypertension Brother     Past Surgical History:  Procedure Laterality Date   AMPUTATION Right 06/12/2013   Procedure: Transtibial Amputation;  Surgeon: Newt Minion, MD;  Location: [facility];  Service: Orthopedics;  Laterality: Right;   AMPUTATION Right 06/27/2013   Procedure: AMPUTATION ABOVE KNEE;  Surgeon: Newt Minion, MD;  Location: [facility];  Service: Orthopedics;  Laterality: Right;  Right Above Knee Amputation   AV FISTULA PLACEMENT Left 04/07/2012   Procedure: ARTERIOVENOUS (AV) FISTULA CREATION;  Surgeon: Angelia Mould, MD;  Location: [facility];  Service: Vascular;  Laterality: Left;   FISTULOGRAM Left 08/07/2012   Procedure: FISTULOGRAM;  Surgeon: Angelia Mould, MD;  Location: [facility] CATH LAB;  Service: Cardiovascular;  Laterality: Left;  arm   I & D EXTREMITY Right 06/02/2013   Procedure: IRRIGATION AND DEBRIDEMENT ARTHROSCOPIC RIGHT ANKLE;  Surgeon: Augustin Schooling, MD;  Location: [facility];  Service: Orthopedics;  Laterality: Right;   I & D  EXTREMITY Right 06/04/2013   Procedure: IRRIGATION AND DEBRIDEMENT RIGHT FOOT/ANKLE;  Surgeon: Newt Minion, MD;  Location: [facility];  Service: Orthopedics;  Laterality: Right;   I & D EXTREMITY Right 06/06/2013   Procedure: IRRIGATION AND DEBRIDEMENT EXTREMITY;  Surgeon: Newt Minion, MD;  Location: [facility];  Service: Orthopedics;  Laterality: Right;   I & D EXTREMITY Right 06/24/2013   Procedure: IRRIGATION AND DEBRIDEMENT and Revsion of TRANSTIBIAL AMPUTATION;  Surgeon: Newt Minion, MD;  Location: [facility];  Service: Orthopedics;  Laterality: Right;   INSERTION OF DIALYSIS CATHETER Right 09/19/2012   Procedure: INSERTION OF DIALYSIS CATHETER;  Surgeon: Angelia Mould, MD;  Location: [facility];  Service: Vascular;  Laterality: Right;   LIGATION OF COMPETING BRANCHES OF ARTERIOVENOUS FISTULA Left 09/19/2012   Procedure: LIGATION OF COMPETING BRANCHES OF LEFT ARM ARTERIOVENOUS FISTULA; Vein angioplasty;  Surgeon: Angelia Mould, MD;  Location: [facility];  Service: Vascular;  Laterality: Left;   none     Social History   Occupational History   Not on file  Tobacco Use   Smoking status: Never   Smokeless tobacco: Never  Vaping Use   Vaping Use: Never used  Substance and Sexual Activity   Alcohol use: No    Comment: occ   Drug use: Yes    Types: Marijuana    Comment: random, last 2 weeks ago   Sexual activity: Not on file

## 2022-08-31 ENCOUNTER — Telehealth: Payer: Self-pay | Admitting: Orthopedic Surgery

## 2022-08-31 ENCOUNTER — Encounter (HOSPITAL_COMMUNITY): Payer: Self-pay | Admitting: *Deleted

## 2022-09-01 ENCOUNTER — Encounter (HOSPITAL_COMMUNITY): Payer: Self-pay | Admitting: *Deleted

## 2022-09-08 ENCOUNTER — Ambulatory Visit (INDEPENDENT_AMBULATORY_CARE_PROVIDER_SITE_OTHER): Payer: Medicare HMO | Admitting: Family

## 2022-09-08 ENCOUNTER — Encounter: Payer: Self-pay | Admitting: Family

## 2022-09-08 DIAGNOSIS — Z89611 Acquired absence of right leg above knee: Secondary | ICD-10-CM | POA: Diagnosis not present

## 2022-09-08 DIAGNOSIS — M79604 Pain in right leg: Secondary | ICD-10-CM

## 2022-09-08 NOTE — Progress Notes (Signed)
Office Visit Note   Patient: Steven Logan           Date of Birth: [birth year]           MRN: [id number] Visit Date: 09/08/2022              Requested by: Raymon Mutton., FNP [address] [place],  [place] [address] PCP: Raymon Mutton., FNP  Chief Complaint  Patient presents with   Right Leg - Pain    HX AKA       HPI: The patient is a 52 year old gentleman with history of right above-knee amputation who presents like for concern of pain and swelling to his right residual limb.  He reports about a week ago he had intense pain on the lateral aspect of his residual limb had difficulty bearing weight in his prosthetic due to pain.  Notes that there was associated swelling never noted a wound or drainage no fever no chills  Assessment & Plan: Visit Diagnoses: No diagnosis found.  Plan: As this is resolving discussed using hot compresses.  He will weight-bear as tolerated with his prosthetic.  Call for any worsening we will hold off on antibiotics at this time.  Follow-Up Instructions: No follow-ups on file.   Ortho Exam  Patient is alert, oriented, no adenopathy, well-dressed, normal affect, normal respiratory effort. On examination right residual limb the limb is well consolidated there are no open ulcers he does have an area of induration without erythema or warmth laterally appears to have had an old abscess this is smaller than a blueberry.  There is no opening or drainage this is firm  Imaging: No results found. No images are attached to the encounter.  Labs: Lab Results  Component Value Date   HGBA1C 5.9 (A) 11/21/2017   HGBA1C 5.8 (H) 05/08/2017   HGBA1C 7.0 (H) 06/07/2013   ESRSEDRATE 135 (H) 06/02/2013   CRP 40.5 (H) 06/02/2013   REPTSTATUS 05/13/2017 FINAL 05/08/2017   REPTSTATUS 05/13/2017 FINAL 05/08/2017   GRAMSTAIN  06/04/2013    FEW WBC PRESENT,BOTH PMN AND MONONUCLEAR NO SQUAMOUS EPITHELIAL CELLS SEEN FEW GRAM POSITIVE COCCI IN PAIRS Performed  at [facility]   GRAMSTAIN  06/04/2013    RARE WBC PRESENT,BOTH PMN AND MONONUCLEAR NO SQUAMOUS EPITHELIAL CELLS SEEN FEW GRAM POSITIVE COCCI IN PAIRS Performed at [facility]   CULT  05/08/2017    NO GROWTH 5 DAYS Performed at [facility], [address], [place], [place] [address]    CULT  05/08/2017    NO GROWTH 5 DAYS Performed at [facility], [address], [place], [place] [address]    [facility] STAPHYLOCOCCUS AUREUS 06/04/2013     Lab Results  Component Value Date   ALBUMIN 3.1 (L) 05/10/2017   ALBUMIN 3.1 (L) 05/09/2017   ALBUMIN 3.3 (L) 05/08/2017    Lab Results  Component Value Date   MG 1.6 (L) 05/10/2017   MG 1.1 (L) 05/09/2017   MG 2.5 06/03/2013   No results found for: "VD25OH"  No results found for: "PREALBUMIN"    Latest Ref Rng & Units 05/08/2017    5:11 AM 05/07/2017    9:52 PM 07/02/2013    8:35 AM  CBC EXTENDED  WBC 4.0 - 10.5 K/uL 4.5  5.1  11.3   RBC 4.22 - 5.81 MIL/uL 3.85  4.45  3.04   Hemoglobin 13.0 - 17.0 g/dL 62.9  52.8  8.6   HCT  39.0 - 52.0 % 33.1  37.4  27.2   Platelets 150 - 400 K/uL 205  249  419   NEUT# 1.7 - 7.7 K/uL  3.4    Lymph# 0.7 - 4.0 K/uL  0.9       There is no height or weight on file to calculate BMI.  Orders:  No orders of the defined types were placed in this encounter.  No orders of the defined types were placed in this encounter.    Procedures: No procedures performed  Clinical Data: No additional findings.  ROS:  All other systems negative, except as noted in the HPI. Review of Systems  Objective: Vital Signs: There were no vitals taken for this visit.  Specialty Comments:  No specialty comments available.  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   Drug-induced leukopenia (HCC) 08/31/2016   Metabolic acidosis 07/20/2016   History of right above knee amputation (HCC) 07/12/2016   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 06/22/2016   Immunosuppression (HCC) 05/26/2016   Acute blood loss anemia 06/25/2013   Hyperglycemia 06/07/2013   Arm DVT (deep venous thromboembolism), acute (HCC) 06/07/2013   ESRD on dialysis (HCC) 06/05/2013   Encephalopathy, toxic 06/03/2013   Anemia in chronic kidney disease 06/03/2013   Arm edema 06/03/2013   Leg abscess 06/03/2013   Severe sepsis (HCC) 06/03/2013   Septic shock (HCC) 06/03/2013   Septic arthritis of ankle or foot, right 06/03/2013   Abscess of right lower leg 06/02/2013   Wrist lump 07/05/2012   Swelling of limb-Left index and middle fingers 07/05/2012   Aftercare following surgery of the circulatory system, NEC 07/05/2012   Tingling-left wrist and some pain. 07/05/2012   Pulmonary edema 05/04/2012   Acute respiratory failure (HCC) 05/04/2012   CKD (chronic kidney disease) stage 5, GFR less than 15 ml/min (HCC) 05/04/2012   Hyperkalemia 05/04/2012   Pre-operative cardiovascular examination 03/15/2012   Iron deficiency anemia 09/22/2010   HTN (hypertension) 09/22/2010   Hyperlipidemia 09/22/2010   Gout 09/22/2010   CRI (chronic renal insufficiency) 09/22/2010   Pulmonary embolism, bilateral (HCC) 09/22/2010   Past Medical History:  Diagnosis Date   Abscess    great toe   Anemia    Arthritis    Cellulitis and abscess of leg 06/03/2013   Chronic kidney disease    esrd   Depression    Diabetes mellitus     Dyslipidemia    Eczema    Gout    HTN (hypertension)    Morbid obesity (HCC)    Pancreatitis    Pneumonia    2-3 years ago   Pulmonary embolism (HCC)    3  in  lungs  at  one time...   Renal hypertension    Hemo started  Sept 2014- MWF   Shortness of breath    ?? chest  cold he has now.  10/8- no SOB    Family History  Problem  Relation Age of Onset   Stroke Mother    Diabetes Mother    Hypertension Mother    Hypertension Father    Cancer Other    Coronary artery disease Other    Colon cancer Maternal Grandmother 71   Hypertension Brother     Past Surgical History:  Procedure Laterality Date   AMPUTATION Right 06/12/2013   Procedure: Transtibial Amputation;  Surgeon: Nadara Mustard, MD;  [facility];  Service: Orthopedics;  Laterality: Right;   AMPUTATION Right 06/27/2013   Procedure: AMPUTATION ABOVE KNEE;  Surgeon: Nadara Mustard, MD;  [facility];  Service: Orthopedics;  Laterality: Right;  Right Above Knee Amputation   AV FISTULA PLACEMENT Left 04/07/2012   Procedure: ARTERIOVENOUS (AV) FISTULA CREATION;  Surgeon: Chuck Hint, MD;  Location: [facility] OR;  Service: Vascular;  Laterality: Left;   FISTULOGRAM Left 08/07/2012   Procedure: FISTULOGRAM;  Surgeon: Chuck Hint, MD;  Location: [facility] CATH LAB;  Service: Cardiovascular;  Laterality: Left;  arm   I & D EXTREMITY Right 06/02/2013   Procedure: IRRIGATION AND DEBRIDEMENT ARTHROSCOPIC RIGHT ANKLE;  Surgeon: Verlee Rossetti, MD;  Location: [facility] OR;  Service: Orthopedics;  Laterality: Right;   I & D EXTREMITY Right 06/04/2013   Procedure: IRRIGATION AND DEBRIDEMENT RIGHT FOOT/ANKLE;  Surgeon: Nadara Mustard, MD;  [facility];  Service: Orthopedics;  Laterality: Right;   I & D EXTREMITY Right 06/06/2013   Procedure: IRRIGATION AND DEBRIDEMENT EXTREMITY;  Surgeon: Nadara Mustard, MD;  [facility];  Service: Orthopedics;  Laterality: Right;   I & D EXTREMITY Right 06/24/2013   Procedure: IRRIGATION AND DEBRIDEMENT  and Revsion of TRANSTIBIAL AMPUTATION;  Surgeon: Nadara Mustard, MD;  [facility];  Service: Orthopedics;  Laterality: Right;   INSERTION OF DIALYSIS CATHETER Right 09/19/2012   Procedure: INSERTION OF DIALYSIS CATHETER;  Surgeon: Chuck Hint, MD;  Location: [facility] OR;  Service: Vascular;  Laterality: Right;   LIGATION OF COMPETING BRANCHES OF ARTERIOVENOUS FISTULA Left 09/19/2012   Procedure: LIGATION OF COMPETING BRANCHES OF LEFT ARM ARTERIOVENOUS FISTULA; Vein angioplasty;  Surgeon: Chuck Hint, MD;  [facility];  Service: Vascular;  Laterality: Left;   none     Social History   Occupational History   Not on file  Tobacco Use   Smoking status: Never   Smokeless tobacco: Never  Vaping Use   Vaping status: Never Used  Substance and Sexual Activity   Alcohol use: No    Comment: occ   Drug use: Yes    Types: Marijuana    Comment: random, last 2 weeks ago   Sexual activity: Not on file

## 2022-09-24 ENCOUNTER — Encounter (HOSPITAL_COMMUNITY): Payer: Self-pay

## 2022-09-24 ENCOUNTER — Emergency Department (HOSPITAL_COMMUNITY)
Admission: EM | Admit: 2022-09-24 | Discharge: 2022-09-24 | Discharge status: Against Medical Advice | Payer: Medicare HMO | Attending: Emergency Medicine | Admitting: Emergency Medicine

## 2022-09-24 DIAGNOSIS — R112 Nausea with vomiting, unspecified: Secondary | ICD-10-CM | POA: Insufficient documentation

## 2022-09-24 DIAGNOSIS — R42 Dizziness and giddiness: Secondary | ICD-10-CM | POA: Insufficient documentation

## 2022-09-24 DIAGNOSIS — R109 Unspecified abdominal pain: Secondary | ICD-10-CM | POA: Insufficient documentation

## 2022-09-24 DIAGNOSIS — Z5321 Procedure and treatment not carried out due to patient leaving prior to being seen by health care provider: Secondary | ICD-10-CM | POA: Insufficient documentation

## 2022-09-24 LAB — URINALYSIS, ROUTINE W REFLEX MICROSCOPIC
Bacteria, UA: NONE SEEN
Bilirubin Urine: NEGATIVE
Glucose, UA: NEGATIVE mg/dL
Ketones, ur: NEGATIVE mg/dL
Leukocytes,Ua: NEGATIVE
Nitrite: NEGATIVE
Protein, ur: 30 mg/dL — AB
Specific Gravity, Urine: 1.02 (ref 1.005–1.030)
pH: 5 (ref 5.0–8.0)

## 2022-09-24 NOTE — ED Notes (Signed)
 Could not get blood work

## 2022-09-24 NOTE — ED Notes (Signed)
Pt returned labels and told registration he was leaving.

## 2022-09-24 NOTE — ED Triage Notes (Addendum)
Pt coming in today complaining of right sided abd pain, N/V, and dizziness that began today. Pt given 4mg  zofran by EMS. Hx of kidney transplant on the right side.

## 2022-09-26 ENCOUNTER — Encounter (HOSPITAL_BASED_OUTPATIENT_CLINIC_OR_DEPARTMENT_OTHER): Payer: Self-pay

## 2022-09-26 ENCOUNTER — Emergency Department (HOSPITAL_BASED_OUTPATIENT_CLINIC_OR_DEPARTMENT_OTHER)
Admission: EM | Admit: 2022-09-26 | Discharge: 2022-09-26 | Disposition: A | Discharge status: Home / Self Care | Payer: Medicare HMO | Source: Home / Self Care | Attending: Emergency Medicine | Admitting: Emergency Medicine

## 2022-09-26 ENCOUNTER — Other Ambulatory Visit: Payer: Self-pay

## 2022-09-26 DIAGNOSIS — R112 Nausea with vomiting, unspecified: Secondary | ICD-10-CM | POA: Diagnosis not present

## 2022-09-26 DIAGNOSIS — R197 Diarrhea, unspecified: Secondary | ICD-10-CM | POA: Diagnosis present

## 2022-09-26 DIAGNOSIS — Z940 Kidney transplant status: Secondary | ICD-10-CM | POA: Insufficient documentation

## 2022-09-26 DIAGNOSIS — Z20822 Contact with and (suspected) exposure to covid-19: Secondary | ICD-10-CM | POA: Diagnosis not present

## 2022-09-26 LAB — CBC
HCT: 40.5 % (ref 39.0–52.0)
Hemoglobin: 12.8 g/dL — ABNORMAL LOW (ref 13.0–17.0)
MCH: 25.1 pg — ABNORMAL LOW (ref 26.0–34.0)
MCHC: 31.6 g/dL (ref 30.0–36.0)
MCV: 79.4 fL — ABNORMAL LOW (ref 80.0–100.0)
Platelets: 229 10*3/uL (ref 150–400)
RBC: 5.1 MIL/uL (ref 4.22–5.81)
RDW: 14.8 % (ref 11.5–15.5)
WBC: 4 10*3/uL (ref 4.0–10.5)
nRBC: 0 % (ref 0.0–0.2)

## 2022-09-26 LAB — COMPREHENSIVE METABOLIC PANEL
ALT: 10 U/L (ref 0–44)
AST: 11 U/L — ABNORMAL LOW (ref 15–41)
Albumin: 4.1 g/dL (ref 3.5–5.0)
Alkaline Phosphatase: 78 U/L (ref 38–126)
Anion gap: 11 (ref 5–15)
BUN: 27 mg/dL — ABNORMAL HIGH (ref 6–20)
CO2: 19 mmol/L — ABNORMAL LOW (ref 22–32)
Calcium: 9.7 mg/dL (ref 8.9–10.3)
Chloride: 106 mmol/L (ref 98–111)
Creatinine, Ser: 1.74 mg/dL — ABNORMAL HIGH (ref 0.61–1.24)
GFR, Estimated: 47 mL/min — ABNORMAL LOW (ref >60)
Glucose, Bld: 134 mg/dL — ABNORMAL HIGH (ref 70–99)
Potassium: 4.4 mmol/L (ref 3.5–5.1)
Sodium: 136 mmol/L (ref 135–145)
Total Bilirubin: 0.5 mg/dL (ref 0.3–1.2)
Total Protein: 7.3 g/dL (ref 6.5–8.1)

## 2022-09-26 LAB — URINALYSIS, ROUTINE W REFLEX MICROSCOPIC
Bacteria, UA: NONE SEEN
Bilirubin Urine: NEGATIVE
Glucose, UA: NEGATIVE mg/dL
Ketones, ur: NEGATIVE mg/dL
Leukocytes,Ua: NEGATIVE
Nitrite: NEGATIVE
Specific Gravity, Urine: 1.018 (ref 1.005–1.030)
pH: 6 (ref 5.0–8.0)

## 2022-09-26 LAB — RESP PANEL BY RT-PCR (RSV, FLU A&B, COVID)  RVPGX2
Influenza A by PCR: NEGATIVE
Influenza B by PCR: NEGATIVE
Resp Syncytial Virus by PCR: NEGATIVE
SARS Coronavirus 2 by RT PCR: NEGATIVE

## 2022-09-26 MED ORDER — ONDANSETRON 4 MG PO TBDP: 4.0000 mg | ORAL_TABLET | Freq: Three times a day (TID) | ORAL | 0 refills | Status: AC | PRN

## 2022-09-26 NOTE — ED Notes (Signed)
 RN reviewed discharge instructions with pt. Pt verbalized understanding and had no further questions. VSS upon discharge.  

## 2022-09-26 NOTE — Discharge Instructions (Signed)
Overall suspect you do have a viral process or foodborne illness.  Take Zofran as needed if you develop any further nausea.  COVID test you can follow-up on your MyChart which should be available in the next few hours.  Consider using Imodium if you are having up to 10-12 stools a day but I suspect symptoms will continue to improve as they have.  Return if symptoms worsen.

## 2022-09-26 NOTE — ED Triage Notes (Signed)
Pt to ED c/o emesis, nausea, diarrhea x 3 days. Went to [facility] on 8/30 but left d/t long wait times. Hx kidney transplant 2018

## 2022-09-26 NOTE — ED Provider Notes (Signed)
[facility] DEPARTMENT AT [facility] Provider Note   CSN: [id number] Arrival date & time: 09/26/22  1433     History  Chief Complaint  Patient presents with   Emesis   Diarrhea    Steven Logan is a 52 y.o. male.  Patient here with nausea vomiting diarrhea for 3 days.  Overall symptoms are greatly improving.  He has not had any emesis now for 24 hours.  He had 2 episodes of diarrhea today but only after eating food.  He is not having any abdominal pain.  He has a history of kidney transplant back in 2018.  He is been able to tolerate his meds.  He denies any fevers or chills.  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  Right now he does not feel dehydrated.  Is been able to tolerate p.o.  No obvious suspicious food intake.  He has not had any fevers.  The history is provided by the patient.       Home Medications Prior to Admission medications   Medication Sig Start Date End Date Taking? Authorizing Provider  ondansetron (ZOFRAN-ODT) 4 MG disintegrating tablet Take 1 tablet (4 mg total) by mouth every 8 (eight) hours as needed. 09/26/22  Yes Sumner Boesch, DO  amLODipine (NORVASC) 10 MG tablet Take 10 mg by mouth daily after breakfast.  04/13/17   [provider]  calcitRIOL (ROCALTROL) 0.25 MCG capsule Take 0.25 mcg by mouth daily. 12/07/18   [provider]  ELIQUIS 5 MG TABS tablet TAKE 2 TABLETS BY MOUTH TWICE DAILY FOR 7 DAYS 06/13/18   [provider]  furosemide (LASIX) 20 MG tablet  08/30/18   [provider]  JANUVIA 50 MG tablet TAKE 1 TABLET BY MOUTH ONCE DAILY AFTER BREAKFAST 03/05/19   Grayce Sessions, NP  labetalol (NORMODYNE) 200 MG tablet Take 100 mg by mouth 2 (two) times daily.     [provider]  mycophenolate (MYFORTIC) 180 MG EC tablet Take 360 mg by mouth 2 (two) times daily.  05/03/17   [provider]  predniSONE (DELTASONE) 5 MG tablet Take 5 mg by mouth daily.  05/30/17   [provider]  sodium bicarbonate 650 MG tablet Take by mouth. 05/18/17   [provider]  tacrolimus (PROGRAF) 1 MG capsule Take 2 mg by mouth 2 (two) times daily. 05/03/17   [provider]  vancomycin (VANCOCIN) 125 MG capsule Take 125 mg by mouth every 6 (six) hours. 12/29/18   [provider]      Allergies    Patient has no known allergies.    Review of Systems   Review of Systems  Physical Exam Updated Vital Signs BP (!) 130/90 (BP Location: Right Arm)   Pulse 74   Temp 98.1 F (36.7 C) (Oral)   Resp 19   Ht 6' (1.829 m)   Wt 105.2 kg   SpO2 97%   BMI 31.46 kg/m  Physical Exam Vitals and nursing note reviewed.  Constitutional:      General: He is not in acute distress.    Appearance: He is well-developed. He is not ill-appearing.  HENT:     Head: Normocephalic and atraumatic.     Nose: Nose normal.     Mouth/Throat:     Mouth: Mucous membranes are moist.  Eyes:     Extraocular Movements: Extraocular movements intact.     Conjunctiva/sclera: Conjunctivae normal.  Pupils: Pupils are equal, round, and reactive to light.  Cardiovascular:     Rate and Rhythm: Normal rate and regular rhythm.     Pulses: Normal pulses.     Heart sounds: Normal heart sounds. No murmur heard. Pulmonary:     Effort: Pulmonary effort is normal. No respiratory distress.     Breath sounds: Normal breath sounds.  Abdominal:     General: Abdomen is flat.     Palpations: Abdomen is soft.     Tenderness: There is no abdominal tenderness.  Musculoskeletal:        General: No swelling.     Cervical back: Normal range of motion and neck supple.  Skin:    General: Skin is warm and dry.     Capillary Refill: Capillary refill takes less than 2 seconds.  Neurological:     General: No focal deficit present.     Mental Status: He is alert.  Psychiatric:        Mood and Affect: Mood normal.     ED Results / Procedures / Treatments   Labs (all  labs ordered are listed, but only abnormal results are displayed) Labs Reviewed  COMPREHENSIVE METABOLIC PANEL - Abnormal; Notable for the following components:      Result Value   CO2 19 (*)    Glucose, Bld 134 (*)    BUN 27 (*)    Creatinine, Ser 1.74 (*)    AST 11 (*)    GFR, Estimated 47 (*)    All other components within normal limits  CBC - Abnormal; Notable for the following components:   Hemoglobin 12.8 (*)    MCV 79.4 (*)    MCH 25.1 (*)    All other components within normal limits  URINALYSIS, ROUTINE W REFLEX MICROSCOPIC - Abnormal; Notable for the following components:   Hgb urine dipstick TRACE (*)    Protein, ur TRACE (*)    All other components within normal limits  RESP PANEL BY RT-PCR (RSV, FLU A&B, COVID)  [id number]    EKG None  Radiology No results found.  Procedures Procedures    Medications Ordered in ED Medications - No data to display  ED Course/ Medical Decision Making/ A&P                                 Medical Decision Making Amount and/or Complexity of Data Reviewed Labs: ordered.  Risk Prescription drug management.   Steven Logan is here with nausea vomiting diarrhea.  Unremarkable vitals.  No fever.  History of renal transplant, PE on blood thinners.  He is very well-appearing.  His symptoms have mostly resolved.  He did not having any more nausea or vomiting.  He had 2 episodes of diarrhea this morning mostly after eating.  He is not having any abdominal pain.  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  He is very well-appearing and already has had blood work done including CBC and CMP which were unremarkable per my review interpretation.  Creatinine is at baseline.  No significant leukocytosis or anemia or electrolyte abnormality otherwise.  Patient and I talked about doing some IV fluids but he felt fine.  Recommend maybe Imodium if he was having multiple diarrhea episodes a day  but seems like things are already improving.  Will test him for COVID I will give him a  prescription for Zofran and write him out of work a few days.  Discharged in good condition.  Understands return precautions.  Have no concern for other acute process.  I suspect a resolving viral process/foodborne illness.  Have no concern for other intra-abdominal process.  This chart was dictated using voice recognition software.  Despite best efforts to proofread,  errors can occur which can change the documentation meaning.         Final Clinical Impression(s) / ED Diagnoses Final diagnoses:  Diarrhea, unspecified type    Rx / DC Orders ED Discharge Orders          Ordered    ondansetron (ZOFRAN-ODT) 4 MG disintegrating tablet  Every 8 hours PRN        09/26/22 1613              Virgina Norfolk, DO 09/26/22 1617

## 2022-10-14 NOTE — Progress Notes (Signed)
ok 

## 2023-04-27 ENCOUNTER — Encounter: Payer: Self-pay | Admitting: Gastroenterology

## 2023-05-24 ENCOUNTER — Encounter (HOSPITAL_COMMUNITY): Payer: Self-pay | Admitting: *Deleted

## 2023-06-16 ENCOUNTER — Encounter (HOSPITAL_COMMUNITY): Payer: Self-pay | Admitting: *Deleted

## 2023-06-23 ENCOUNTER — Ambulatory Visit: Admitting: Gastroenterology

## 2023-06-29 NOTE — Progress Notes (Deleted)
 06/29/2023 Steven Logan [id number] 06-23-1970  Referring provider: Claudene Prentice DELENA Mickey., FNP Primary GI doctor: Dr. Albertus  ASSESSMENT AND PLAN:  History of diarrhea on immunosuppression 2021 seen at [facility] for diarrhea had positive Cryptosporidium and treated 11/2010 colonoscopy for IDA incomplete colonoscopy due to poor prep using movie prep not adequate to continue procedure terminated specific reschedule for 2-day preparation   11/2010 EGD with Dr. Albertus for IDA showed normal esophagus erosions in body and antrum of stomach, moderate gastritis in stomach duodenitis, unknown pathology due to being unable to open surgical path History of PE On Eliquis  End-stage renal disease secondary to diabetes Status post renal transplant 2018 On tacrolimus , mycophenolate , prednisone  5 mg  06/23/2023 negative CMV Follows with [facility] last seen 06/23/2023  History of anemia secondary to CKD  B12 deficiency 06/23/2023 B12 169 Iron 74, ferritin 1324   Patient Care Team: Claudene Prentice DELENA Mickey., FNP as PCP - General (Family Medicine)  HISTORY OF PRESENT ILLNESS: 53 y.o. male with a past medical history listed below presents for evaluation of colonoscopy while on anticoagulation.  Previously seen 2021 at [facility] for diarrhea secondary to Cryptosporidium treated with nitazoxanide  500 mg twice daily with improvement of the symptoms.  *** Discussed the use of AI scribe software for clinical note transcription with the patient, who gave verbal consent to proceed.  History of Present Illness            He  reports that he has never smoked. He has never used smokeless tobacco. He reports current drug use. Drug: Marijuana. He reports that he does not drink alcohol.  RELEVANT GI HISTORY, IMAGING AND LABS: Results          CBC    Component Value Date/Time   WBC 4.0 09/26/2022 1448   RBC 5.10 09/26/2022 1448   HGB 12.8 (L) 09/26/2022 1448   HCT 40.5  09/26/2022 1448   PLT 229 09/26/2022 1448   MCV 79.4 (L) 09/26/2022 1448   MCH 25.1 (L) 09/26/2022 1448   MCHC 31.6 09/26/2022 1448   RDW 14.8 09/26/2022 1448   LYMPHSABS 0.9 05/07/2017 2152   MONOABS 0.6 05/07/2017 2152   EOSABS 0.2 05/07/2017 2152   BASOSABS 0.0 05/07/2017 2152   Recent Labs    09/26/22 1448  HGB 12.8*    CMP     Component Value Date/Time   NA 136 09/26/2022 1448   K 4.4 09/26/2022 1448   CL 106 09/26/2022 1448   CO2 19 (L) 09/26/2022 1448   GLUCOSE 134 (H) 09/26/2022 1448   BUN 27 (H) 09/26/2022 1448   CREATININE 1.74 (H) 09/26/2022 1448   CALCIUM  9.7 09/26/2022 1448   PROT 7.3 09/26/2022 1448   ALBUMIN 4.1 09/26/2022 1448   AST 11 (L) 09/26/2022 1448   ALT 10 09/26/2022 1448   ALKPHOS 78 09/26/2022 1448   BILITOT 0.5 09/26/2022 1448   GFRNONAA 47 (L) 09/26/2022 1448   GFRAA 57 (L) 05/10/2017 0557      Latest Ref Rng & Units 09/26/2022    2:48 PM 05/10/2017    5:57 AM 05/09/2017    5:51 AM  Hepatic Function  Total Protein 6.5 - 8.1 g/dL 7.3  6.6  6.6   Albumin 3.5 - 5.0 g/dL 4.1  3.1  3.1   AST 15 - 41 U/L 11  13  15    ALT 0 - 44 U/L 10  16  14    Alk Phosphatase 38 -  126 U/L 78  78  81   Total Bilirubin 0.3 - 1.2 mg/dL 0.5  0.2  0.4       Current Medications:   Current Outpatient Medications (Endocrine & Metabolic):    calcitRIOL  (ROCALTROL ) 0.25 MCG capsule, Take 0.25 mcg by mouth daily.   JANUVIA  50 MG tablet, TAKE 1 TABLET BY MOUTH ONCE DAILY AFTER BREAKFAST   predniSONE  (DELTASONE ) 5 MG tablet, Take 5 mg by mouth daily.  Current Outpatient Medications (Cardiovascular):    amLODipine  (NORVASC ) 10 MG tablet, Take 10 mg by mouth daily after breakfast.    furosemide  (LASIX ) 20 MG tablet,    labetalol  (NORMODYNE ) 200 MG tablet, Take 100 mg by mouth 2 (two) times daily.     Current Outpatient Medications (Hematological):    ELIQUIS 5 MG TABS tablet, TAKE 2 TABLETS BY MOUTH TWICE DAILY FOR 7 DAYS  Current Outpatient Medications  (Other):    mycophenolate  (MYFORTIC ) 180 MG EC tablet, Take 360 mg by mouth 2 (two) times daily.    ondansetron  (ZOFRAN -ODT) 4 MG disintegrating tablet, Take 1 tablet (4 mg total) by mouth every 8 (eight) hours as needed.   sodium bicarbonate  650 MG tablet, Take by mouth.   tacrolimus  (PROGRAF ) 1 MG capsule, Take 2 mg by mouth 2 (two) times daily.   vancomycin  (VANCOCIN ) 125 MG capsule, Take 125 mg by mouth every 6 (six) hours.  Medical History:  Past Medical History:  Diagnosis Date   Abscess    great toe   Anemia    Arthritis    Cellulitis and abscess of leg 06/03/2013   Chronic kidney disease    esrd   Depression    Diabetes mellitus    Dyslipidemia    Eczema    Gout    HTN (hypertension)    Morbid obesity (HCC)    Pancreatitis    Pneumonia    2-3 years ago   Pulmonary embolism (HCC)    3  in  lungs  at  one time...   Renal hypertension    Hemo started  Sept 2014- MWF   Shortness of breath    ?? chest  cold he has now.  10/8- no SOB   Allergies: No Known Allergies   Surgical History:  He  has a past surgical history that includes none; AV fistula placement (Left, 04/07/2012); Ligation of competing branches of arteriovenous fistula (Left, 09/19/2012); Insertion of dialysis catheter (Right, 09/19/2012); I & D extremity (Right, 06/02/2013); I & D extremity (Right, 06/04/2013); I & D extremity (Right, 06/06/2013); Amputation (Right, 06/12/2013); I & D extremity (Right, 06/24/2013); Amputation (Right, 06/27/2013); and Fistulogram (Left, 08/07/2012). Family History:  His family history includes Cancer in an other family member; Colon cancer (age of onset: 75) in his maternal grandmother; Coronary artery disease in an other family member; Diabetes in his mother; Hypertension in his brother, father, and mother; Stroke in his mother.  REVIEW OF SYSTEMS  : All other systems reviewed and negative except where noted in the History of Present Illness.  PHYSICAL EXAM: There were no vitals taken  for this visit. Physical Exam          Alan JONELLE Coombs, PA-C 11:55 AM

## 2023-06-30 ENCOUNTER — Ambulatory Visit: Admitting: Physician Assistant

## 2023-08-17 ENCOUNTER — Ambulatory Visit: Admitting: Physician Assistant

## 2023-08-17 ENCOUNTER — Other Ambulatory Visit

## 2023-08-17 ENCOUNTER — Telehealth: Payer: Self-pay

## 2023-08-17 ENCOUNTER — Encounter: Payer: Self-pay | Admitting: Physician Assistant

## 2023-08-17 VITALS — BP 126/74 | HR 85 | Ht 72.0 in | Wt 232.0 lb

## 2023-08-17 DIAGNOSIS — Z7901 Long term (current) use of anticoagulants: Secondary | ICD-10-CM

## 2023-08-17 DIAGNOSIS — R195 Other fecal abnormalities: Secondary | ICD-10-CM

## 2023-08-17 DIAGNOSIS — Z86711 Personal history of pulmonary embolism: Secondary | ICD-10-CM | POA: Diagnosis not present

## 2023-08-17 DIAGNOSIS — Z940 Kidney transplant status: Secondary | ICD-10-CM

## 2023-08-17 DIAGNOSIS — Z86718 Personal history of other venous thrombosis and embolism: Secondary | ICD-10-CM

## 2023-08-17 DIAGNOSIS — E119 Type 2 diabetes mellitus without complications: Secondary | ICD-10-CM

## 2023-08-17 DIAGNOSIS — I82409 Acute embolism and thrombosis of unspecified deep veins of unspecified lower extremity: Secondary | ICD-10-CM

## 2023-08-17 DIAGNOSIS — Z1211 Encounter for screening for malignant neoplasm of colon: Secondary | ICD-10-CM

## 2023-08-17 DIAGNOSIS — I509 Heart failure, unspecified: Secondary | ICD-10-CM

## 2023-08-17 DIAGNOSIS — Z89611 Acquired absence of right leg above knee: Secondary | ICD-10-CM

## 2023-08-17 LAB — BASIC METABOLIC PANEL WITH GFR
BUN: 24 mg/dL — ABNORMAL HIGH (ref 6–23)
CO2: 23 meq/L (ref 19–32)
Calcium: 9.4 mg/dL (ref 8.4–10.5)
Chloride: 106 meq/L (ref 96–112)
Creatinine, Ser: 1.85 mg/dL — ABNORMAL HIGH (ref 0.40–1.50)
GFR: 41.06 mL/min — ABNORMAL LOW (ref >60.00)
Glucose, Bld: 145 mg/dL — ABNORMAL HIGH (ref 70–99)
Potassium: 4.9 meq/L (ref 3.5–5.1)
Sodium: 138 meq/L (ref 135–145)

## 2023-08-17 MED ORDER — NA SULFATE-K SULFATE-MG SULF 17.5-3.13-1.6 GM/177ML PO SOLN: 1.0000 | Freq: Once | ORAL | 0 refills | Status: AC

## 2023-08-17 NOTE — Telephone Encounter (Signed)
  Steven Logan February 07, 1970 [id number]  08/17/23   Dear [name] DOROTHA Lipps, MD, MBA, FACA, FAHA:  We have scheduled the above named patient for a(n) Colonoscopy procedure. Our records show that (s)he is on anticoagulation therapy.  Please advise as to whether the patient may come off their therapy of Eliquis 2 days prior to their procedure which is scheduled for 10/27/23.  Please route your response to Alethea Blocker, CMA or fax response to [phone].  Sincerely,    [facility]

## 2023-08-17 NOTE — Patient Instructions (Addendum)
 Your provider has requested that you go to the basement level for lab work before leaving today. Press B on the elevator. The lab is located at the first door on the left as you exit the elevator.  You have been scheduled for a Colonoscopy. Please follow written instructions given to you at your visit today.   If you use inhalers (even only as needed), please bring them with you on the day of your procedure.  DO NOT TAKE 7 DAYS PRIOR TO TEST- Trulicity (dulaglutide) Ozempic, Wegovy (semaglutide) Mounjaro (tirzepatide) Bydureon Bcise (exanatide extended release)  DO NOT TAKE 1 DAY PRIOR TO YOUR TEST Rybelsus (semaglutide) Adlyxin (lixisenatide) Victoza (liraglutide) Byetta (exanatide) ___________________________________________________________________________  Please follow up sooner if symptoms increase or worsen   Due to recent changes in healthcare laws, you may see the results of your imaging and laboratory studies on MyChart before your provider has had a chance to review them.  We understand that in some cases there may be results that are confusing or concerning to you. Not all laboratory results come back in the same time frame and the provider may be waiting for multiple results in order to interpret others.  Please give us  48 hours in order for your provider to thoroughly review all the results before contacting the office for clarification of your results.   Thank you for trusting me with your gastrointestinal care!   Ellouise Console, PA-C _______________________________________________________  If your blood pressure at your visit was 140/90 or greater, please contact your primary care physician to follow up on this.  _______________________________________________________  If you are age 10 or older, your body mass index should be between 23-30. Your Body mass index is 31.46 kg/m. If this is out of the aforementioned range listed, please consider follow up with your  Primary Care Provider.  If you are age 55 or younger, your body mass index should be between 19-25. Your Body mass index is 31.46 kg/m. If this is out of the aformentioned range listed, please consider follow up with your Primary Care Provider.   ________________________________________________________  The [facility] providers would like to encourage you to use MYCHART to communicate with providers for non-urgent requests or questions.  Due to long hold times on the telephone, sending your provider a message by [facility] may be a faster and more efficient way to get a response.  Please allow 48 business hours for a response.  Please remember that this is for non-urgent requests.  _______________________________________________________

## 2023-08-17 NOTE — Progress Notes (Signed)
 Ellouise Console, PA-C [address] [place], [place]  [address] Phone: [phone]   Gastroenterology Consultation  Referring Provider:     Claudene Prentice DELENA Mickey., FNP Primary Care Physician:  Claudene Prentice DELENA Mickey., FNP Primary Gastroenterologist:  Ellouise Console, PA-C / Dr. Gordy Starch  Reason for Consultation:     Discuss colonoscopy        HPI:   Steven Logan is a 53 y.o. y/o male referred for consultation & management  by Claudene Prentice DELENA Mickey., FNP.  Here to discuss scheduling screening colonoscopy.  Current symptoms: Patient states he has occasional loose stools after eating certain foods such as hamburger, popcorn, ice cream, vegetables.  He works in a [employer].  Occasionally exposed to infectious pathogens.  Had kidney transplant in 2018 and has been doing well.  Followed by nephrology.  Is on immunosuppressants.  Has not needed dialysis since 2018.  He denies abdominal pain or constipation.  Denies melena or hematochezia.  Maternal grandmother had colon cancer.  Patient saw [facility] in 2021 for diarrhea.  He was scheduled for colonoscopy at [facility] February 2022, however he did not go.  Patient saw Dr. Starch in 2012 for iron deficiency anemia.  11/2010 colonoscopy by Dr. Starch: Poor prep.  Was recommended to reschedule and with 2-day prep, and patient was lost to follow-up.  11/2010 EGD by Dr. Starch: Normal esophagus.  Multiple gastric erosions with moderate gastritis and duodenitis.  PMH: Diagnosed with ESRD in 2014 secondary to type 2 diabetes.  Started on dialysis in 2014.  Patient received kidney transplant in 2018.  History of Cryptosporidium in 2019.  Anemia of chronic disease, history of pulmonary embolism, chronic DVT LLE, chronic anticoagulation, on Eliquis.  History of right above-knee amputation.  Currently on immunosuppressant.  Local Nephrologist is [facility].  Also Followed by [facility] kidney transplant.  Eliquis is prescribed  by vascular doctor through [facility], Dr. Beryl.  06/23/2023 labs (through [facility]): Glucose 178, BUN 34, creatinine 2.09, GFR 37, potassium 4.8, chloride 106, sodium 137.  Normal LFTs.  WBC 4.3, hemoglobin 13.2, hematocrit 40, MCV 76, platelet 261.  Normal iron studies with total iron 74, iron saturation 23%.  CMV and EBV not detected.  Past Medical History:  Diagnosis Date   Anemia    Arthritis    Chronic kidney disease    esrd   Depression    Diabetes mellitus    DVT (deep venous thrombosis) (HCC)    History of DVT   Dyslipidemia    Eczema    Gout    History of Pancreatitis    HTN (hypertension)    Immunosuppression (HCC) 05/26/2016   Kidney transplant recipient 05/25/2016   Morbid obesity (HCC)    Pulmonary embolism (HCC)    3  in  lungs  at  one time...   Renal hypertension    Hemo started  Sept 2014- MWF    Past Surgical History:  Procedure Laterality Date   AMPUTATION Right 06/12/2013   Procedure: Transtibial Amputation;  Surgeon: Jerona LULLA Sage, MD;  Location[facility] OR;  Service: Orthopedics;  Laterality: Right;   AMPUTATION Right 06/27/2013   Procedure: AMPUTATION ABOVE KNEE;  Surgeon: Jerona Sage LULLA, MD;  [facility] OR;  Service: Orthopedics;  Laterality: Right;  Right Above Knee Amputation   AV FISTULA PLACEMENT Left 04/07/2012   Procedure: ARTERIOVENOUS (AV) FISTULA CREATION;  Surgeon: Lonni GORMAN Blade, MD;  [facility] OR;  Service: Vascular;  Laterality: Left;   FISTULOGRAM Left 08/07/2012   Procedure: FISTULOGRAM;  Surgeon: Lonni GORMAN Blade, MD;  Location: [facility] CATH LAB;  Service: Cardiovascular;  Laterality: Left;  arm   I & D EXTREMITY Right 06/02/2013   Procedure: IRRIGATION AND DEBRIDEMENT ARTHROSCOPIC RIGHT ANKLE;  Surgeon: Elspeth JONELLE Her, MD;  Location: [facility] OR;  Service: Orthopedics;  Laterality: Right;   I & D EXTREMITY Right 06/04/2013   Procedure: IRRIGATION AND DEBRIDEMENT RIGHT FOOT/ANKLE;  Surgeon: Jerona LULLA Sage, MD;  [facility]  OR;  Service: Orthopedics;  Laterality: Right;   I & D EXTREMITY Right 06/06/2013   Procedure: IRRIGATION AND DEBRIDEMENT EXTREMITY;  Surgeon: Jerona LULLA Sage, MD;  [facility] OR;  Service: Orthopedics;  Laterality: Right;   I & D EXTREMITY Right 06/24/2013   Procedure: IRRIGATION AND DEBRIDEMENT and Revsion of TRANSTIBIAL AMPUTATION;  Surgeon: Jerona Sage LULLA, MD;  [facility] OR;  Service: Orthopedics;  Laterality: Right;   INSERTION OF DIALYSIS CATHETER Right 09/19/2012   Procedure: INSERTION OF DIALYSIS CATHETER;  Surgeon: Lonni GORMAN Blade, MD;  Location: [facility] OR;  Service: Vascular;  Laterality: Right;   LIGATION OF COMPETING BRANCHES OF ARTERIOVENOUS FISTULA Left 09/19/2012   Procedure: LIGATION OF COMPETING BRANCHES OF LEFT ARM ARTERIOVENOUS FISTULA; Vein angioplasty;  Surgeon: Lonni GORMAN Blade, MD;  Location: [facility] OR;  Service: Vascular;  Laterality: Left;   none      Prior to Admission medications   Medication Sig Start Date End Date Taking? Authorizing Provider  amLODipine  (NORVASC ) 10 MG tablet Take 10 mg by mouth daily after breakfast.  04/13/17   [provider]  calcitRIOL  (ROCALTROL ) 0.25 MCG capsule Take 0.25 mcg by mouth daily. 12/07/18   [provider]  ELIQUIS 5 MG TABS tablet TAKE 2 TABLETS BY MOUTH TWICE DAILY FOR 7 DAYS 06/13/18   [provider]  furosemide  (LASIX ) 20 MG tablet  08/30/18   [provider]  JANUVIA  50 MG tablet TAKE 1 TABLET BY MOUTH ONCE DAILY AFTER BREAKFAST 03/05/19   Celestia Rosaline SQUIBB, NP  labetalol  (NORMODYNE ) 200 MG tablet Take 100 mg by mouth 2 (two) times daily.     [provider]  mycophenolate  (MYFORTIC ) 180 MG EC tablet Take 360 mg by mouth 2 (two) times daily.  05/03/17   [provider]  ondansetron  (ZOFRAN -ODT) 4 MG disintegrating tablet Take 1 tablet (4 mg total) by mouth every 8 (eight) hours as needed. 09/26/22   Curatolo, Adam, DO  predniSONE  (DELTASONE ) 5 MG tablet Take 5 mg by mouth  daily. 05/30/17   [provider]  sodium bicarbonate  650 MG tablet Take by mouth. 05/18/17   [provider]  tacrolimus  (PROGRAF ) 1 MG capsule Take 2 mg by mouth 2 (two) times daily. 05/03/17   [provider]  vancomycin  (VANCOCIN ) 125 MG capsule Take 125 mg by mouth every 6 (six) hours. 12/29/18   [provider]    Family History  Problem Relation Age of Onset   Stroke Mother    Diabetes Mother    Hypertension Mother    Hypertension Father    Hypertension Brother    Colon cancer Maternal Grandmother 32   Cancer Other    Coronary artery disease Other      Social History   Tobacco Use   Smoking status: Never   Smokeless tobacco: Never  Vaping Use   Vaping status: Never Used  Substance Use Topics   Alcohol use: No  Comment: occ   Drug use: Yes    Types: Marijuana    Comment: random, last 2 weeks ago    Allergies as of 08/17/2023   (No Known Allergies)    Review of Systems:    All systems reviewed and negative except where noted in HPI.   Physical Exam:  BP 126/74   Pulse 85   Ht 6' (1.829 m)   Wt 232 lb (105.2 kg)   BMI 31.46 kg/m  No LMP for male patient.  General:   Alert,  Well-developed, well-nourished, pleasant and cooperative in NAD Lungs:  Respirations even and unlabored.  Clear throughout to auscultation.   No wheezes, crackles, or rhonchi. No acute distress. Heart:  Regular rate and rhythm; no murmurs, clicks, rubs, or gallops. Abdomen:  Normal bowel sounds.  No bruits.  Soft, and obese without masses, hepatosplenomegaly or hernias noted.  No Tenderness.  No guarding or rebound tenderness.    Neurologic:  Alert and oriented x3;  grossly normal neurologically. Psych:  Alert and cooperative. Normal mood and affect. Extremities: Right lower leg prosthesis in place.  Imaging Studies: No results found.  Labs: CBC    Component Value Date/Time   WBC 4.0 09/26/2022 1448   RBC 5.10 09/26/2022 1448   HGB 12.8 (L)  09/26/2022 1448   HCT 40.5 09/26/2022 1448   PLT 229 09/26/2022 1448   MCV 79.4 (L) 09/26/2022 1448   MCH 25.1 (L) 09/26/2022 1448   MCHC 31.6 09/26/2022 1448   RDW 14.8 09/26/2022 1448   LYMPHSABS 0.9 05/07/2017 2152   MONOABS 0.6 05/07/2017 2152   EOSABS 0.2 05/07/2017 2152   BASOSABS 0.0 05/07/2017 2152    CMP     Component Value Date/Time   NA 136 09/26/2022 1448   K 4.4 09/26/2022 1448   CL 106 09/26/2022 1448   CO2 19 (L) 09/26/2022 1448   GLUCOSE 134 (H) 09/26/2022 1448   BUN 27 (H) 09/26/2022 1448   CREATININE 1.74 (H) 09/26/2022 1448   CALCIUM  9.7 09/26/2022 1448   PROT 7.3 09/26/2022 1448   ALBUMIN 4.1 09/26/2022 1448   AST 11 (L) 09/26/2022 1448   ALT 10 09/26/2022 1448   ALKPHOS 78 09/26/2022 1448   BILITOT 0.5 09/26/2022 1448   GFRNONAA 47 (L) 09/26/2022 1448   GFRAA 57 (L) 05/10/2017 0557    Assessment and Plan:   DANH BAYUS is a 53 y.o. y/o male has been referred for:  1.  Colon cancer screening: He is overdue for 10-year repeat colonoscopy. - Scheduling Colonoscopy I discussed risks of colonoscopy with patient to include risk of bleeding, colon perforation, and risk of sedation.  Patient expressed understanding and agrees to proceed with colonoscopy.   2.  Comorbidities: ESRD, history of kidney transplant in 2018, type 2 diabetes, Hx pulmonary emboli, Hx DVT, Hx right above-knee amputation.  Currently on Eliquis and immunosuppressants. - Lab: BMP - Request permission to hold Eliquis 2 days before Colonoscopy from vascular Dr. [name] Lipps at [facility].  3.  Chronic / Intermittent Loose Stools  4.  Family hx Maternal Grandmother with Colon Cancer  Follow up based on colonoscopy results.  Ellouise Console, PA-C

## 2023-08-18 ENCOUNTER — Ambulatory Visit: Payer: Self-pay | Admitting: Physician Assistant

## 2023-08-31 NOTE — Progress Notes (Signed)
 Addendum: Reviewed and agree with assessment and management plan. Appears okay for endoscopic care in the [facility] given that he is no longer on hemodialysis after kidney transplantation. Bill Mcvey, Gordy HERO, MD

## 2023-09-12 ENCOUNTER — Other Ambulatory Visit: Payer: Self-pay

## 2023-09-12 ENCOUNTER — Ambulatory Visit (INDEPENDENT_AMBULATORY_CARE_PROVIDER_SITE_OTHER): Admitting: Physician Assistant

## 2023-09-12 ENCOUNTER — Encounter: Payer: Self-pay | Admitting: Physician Assistant

## 2023-09-12 DIAGNOSIS — M545 Low back pain, unspecified: Secondary | ICD-10-CM

## 2023-09-12 NOTE — Progress Notes (Signed)
 Office Visit Note   Patient: Steven Logan           Date of Birth: [birth year]           MRN: [id number] Visit Date: 09/12/2023              Requested by: Claudene Prentice DELENA Mickey., FNP [address] [place],  [place] [address] PCP: Claudene Prentice DELENA Mickey., FNP  No chief complaint on file.     HPI: 53 y/o male seen in office today with CC: left leg pain with weight bearing activity.  The pain travels up his leg from the calf.  The pain is in the calf and travels to the later thigh.  If he sits for an hour the pain subsides.  He is worried about losing the left leg and wants to be examined.  He denies trauma to the left LE or wounds.  He a history of right AKA by Dr. Harden 06/27/2013 and ambulates with a prosthesis.  He is unable to continue working with his part time job due to pain with weight bearing activity.    He has a history of a traumatic fall with x ray evidence of sacral fracture at approximately S4-5 and Multilevel lumbar spondylosis and facet hypertrophy.  He has a Kidney transplant and is maintained on Prednisone  10 mg daily.     Assessment & Plan: Visit Diagnoses: No diagnosis found.  Plan: Activities as tolerates.  He is meeting with SS office for full disability and will f/u with us  as needed.  If his pain worsens he may nee an MRI of the spine to r/o sciatic pain.    Follow-Up Instructions: No follow-ups on file.   Ortho Exam  Patient is alert, oriented, no adenopathy, well-dressed, normal affect, normal respiratory effort. Good doppler flow DP/PT/Peroneal signals.  Skin warm and intact without evidence of ischemia. Non tender at the his/knee joint.  No edema.  Minimal hip motion about 5 degrees of internal and external rotation from neutral.  .  Decreased internal/external hip rotation without pain.        Imaging: Multilevel lumbar spondylosis and facet hypertrophy. L4-5, L5-S1 DDD with foraminal narrowing.   Labs: Lab Results  Component Value Date   HGBA1C 5.9 (A)  11/21/2017   HGBA1C 5.8 (H) 05/08/2017   HGBA1C 7.0 (H) 06/07/2013   ESRSEDRATE 135 (H) 06/02/2013   CRP 40.5 (H) 06/02/2013   REPTSTATUS 05/13/2017 FINAL 05/08/2017   REPTSTATUS 05/13/2017 FINAL 05/08/2017   GRAMSTAIN  06/04/2013    FEW WBC PRESENT,BOTH PMN AND MONONUCLEAR NO SQUAMOUS EPITHELIAL CELLS SEEN FEW GRAM POSITIVE COCCI IN PAIRS Performed at [facility]   GRAMSTAIN  06/04/2013    RARE WBC PRESENT,BOTH PMN AND MONONUCLEAR NO SQUAMOUS EPITHELIAL CELLS SEEN FEW GRAM POSITIVE COCCI IN PAIRS Performed at [facility]   CULT  05/08/2017    NO GROWTH 5 DAYS Performed at [facility] Lab, [address] [place], [place] [address]    CULT  05/08/2017    NO GROWTH 5 DAYS Performed at [facility], [address], [place], [place] [address]    [facility] STAPHYLOCOCCUS AUREUS 06/04/2013     Lab Results  Component Value Date   ALBUMIN 4.1 09/26/2022   ALBUMIN 3.1 (L) 05/10/2017   ALBUMIN 3.1 (L) 05/09/2017    Lab Results  Component Value Date   MG 1.6 (L) 05/10/2017   MG 1.1 (L) 05/09/2017   MG 2.5 06/03/2013  No results found for: VD25OH  No results found for: PREALBUMIN    Latest Ref Rng & Units 09/26/2022    2:48 PM 05/08/2017    5:11 AM 05/07/2017    9:52 PM  CBC EXTENDED  WBC 4.0 - 10.5 K/uL 4.0  4.5  5.1   RBC 4.22 - 5.81 MIL/uL 5.10  3.85  4.45   Hemoglobin 13.0 - 17.0 g/dL 87.1  89.5  88.0   HCT 39.0 - 52.0 % 40.5  33.1  37.4   Platelets 150 - 400 K/uL 229  205  249   NEUT# 1.7 - 7.7 K/uL   3.4   Lymph# 0.7 - 4.0 K/uL   0.9      There is no height or weight on file to calculate BMI.  Orders:  No orders of the defined types were placed in this encounter.  No orders of the defined types were placed in this encounter.    Procedures: No procedures performed  Clinical Data: No additional findings.  ROS:  All other systems negative, except as noted in the HPI. Review of Systems  Objective: Vital Signs: There  were no vitals taken for this visit.  Specialty Comments:  No specialty comments available.  PMFS History: Patient Active Problem List   Diagnosis Date Noted   Other congestive heart failure (HCC) 08/17/2023   Kidney transplant recipient 08/17/2023   DVT (deep venous thrombosis) (HCC)    Chronic anticoagulation 06/21/2018   History of anemia due to CKD 06/21/2018   Type 2 diabetes mellitus with complications (HCC) 06/21/2018   Hypocalcemia 05/17/2017   Hypomagnesemia 05/17/2017   Fever in adult 05/08/2017   Febrile illness, acute 05/08/2017   AKI (acute kidney injury) (HCC) 04/22/2017   Infection due to cryptosporidium (HCC) 04/21/2017   Sprain of left rotator cuff capsule 04/05/2017   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   Drug-induced leukopenia (HCC) 08/31/2016   Metabolic acidosis 07/20/2016   History of right above knee amputation (HCC) 07/12/2016   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 06/22/2016   Immunosuppression (HCC) 05/26/2016   Acute blood loss anemia 06/25/2013   Hyperglycemia 06/07/2013   Arm DVT (deep venous thromboembolism), acute (HCC) 06/07/2013   Encephalopathy, toxic 06/03/2013   Anemia in chronic kidney disease 06/03/2013   Arm edema 06/03/2013   Leg abscess 06/03/2013   Severe sepsis (HCC) 06/03/2013   Septic shock (HCC) 06/03/2013   Septic arthritis of ankle or foot, right 06/03/2013   Abscess of right lower leg 06/02/2013   Wrist lump 07/05/2012   Swelling of limb-Left index and middle fingers 07/05/2012   Aftercare following surgery of the circulatory system, NEC 07/05/2012   Tingling-left wrist and some pain. 07/05/2012   Pulmonary edema 05/04/2012   Hyperkalemia 05/04/2012   Pre-operative cardiovascular examination 03/15/2012   Iron deficiency anemia 09/22/2010   HTN (hypertension) 09/22/2010   Hyperlipidemia 09/22/2010   Gout 09/22/2010   CRI (chronic renal  insufficiency) 09/22/2010   Pulmonary embolism, bilateral (HCC) 09/22/2010   Past Medical History:  Diagnosis Date   Anemia    Arthritis    Chronic kidney disease    esrd   Depression    Diabetes mellitus    DVT (deep venous thrombosis) (HCC)    History of DVT   Dyslipidemia    Eczema    Gout    History of Pancreatitis    HTN (hypertension)    Immunosuppression (HCC) 05/26/2016   Kidney transplant  recipient 05/25/2016   Morbid obesity (HCC)    Pulmonary embolism (HCC)    3  in  lungs  at  one time...   Renal hypertension    Hemo started  Sept 2014- MWF    Family History  Problem Relation Age of Onset   Stroke Mother    Diabetes Mother    Hypertension Mother    Hypertension Father    Hypertension Brother    Colon cancer Maternal Grandmother 53   Cancer Other    Coronary artery disease Other     Past Surgical History:  Procedure Laterality Date   AMPUTATION Right 06/12/2013   Procedure: Transtibial Amputation;  Surgeon: Jerona LULLA Sage, MD;  [facility] OR;  Service: Orthopedics;  Laterality: Right;   AMPUTATION Right 06/27/2013   Procedure: AMPUTATION ABOVE KNEE;  Surgeon: Jerona Sage LULLA, MD;  Location: [facility] OR;  Service: Orthopedics;  Laterality: Right;  Right Above Knee Amputation   AV FISTULA PLACEMENT Left 04/07/2012   Procedure: ARTERIOVENOUS (AV) FISTULA CREATION;  Surgeon: Lonni GORMAN Blade, MD;  Location: [facility] OR;  Service: Vascular;  Laterality: Left;   FISTULOGRAM Left 08/07/2012   Procedure: FISTULOGRAM;  Surgeon: Lonni GORMAN Blade, MD;  Location: [facility] CATH LAB;  Service: Cardiovascular;  Laterality: Left;  arm   I & D EXTREMITY Right 06/02/2013   Procedure: IRRIGATION AND DEBRIDEMENT ARTHROSCOPIC RIGHT ANKLE;  Surgeon: Elspeth JONELLE Her, MD;  Location: [facility] OR;  Service: Orthopedics;  Laterality: Right;   I & D EXTREMITY Right 06/04/2013   Procedure: IRRIGATION AND DEBRIDEMENT RIGHT FOOT/ANKLE;  Surgeon: Jerona LULLA Sage, MD;  [facility] OR;  Service: Orthopedics;   Laterality: Right;   I & D EXTREMITY Right 06/06/2013   Procedure: IRRIGATION AND DEBRIDEMENT EXTREMITY;  Surgeon: Jerona LULLA Sage, MD;  [facility]: [facility] OR;  Service: Orthopedics;  Laterality: Right;   I & D EXTREMITY Right 06/24/2013   Procedure: IRRIGATION AND DEBRIDEMENT and Revsion of TRANSTIBIAL AMPUTATION;  Surgeon: Jerona Sage LULLA, MD;  Location: [facility] OR;  Service: Orthopedics;  Laterality: Right;   INSERTION OF DIALYSIS CATHETER Right 09/19/2012   Procedure: INSERTION OF DIALYSIS CATHETER;  Surgeon: Lonni GORMAN Blade, MD;  Location[facility] OR;  Service: Vascular;  Laterality: Right;   LIGATION OF COMPETING BRANCHES OF ARTERIOVENOUS FISTULA Left 09/19/2012   Procedure: LIGATION OF COMPETING BRANCHES OF LEFT ARM ARTERIOVENOUS FISTULA; Vein angioplasty;  Surgeon: Lonni GORMAN Blade, MD;  Location: [facility] OR;  Service: Vascular;  Laterality: Left;   none     Social History   Occupational History   Not on file  Tobacco Use   Smoking status: Never   Smokeless tobacco: Never  Vaping Use   Vaping status: Never Used  Substance and Sexual Activity   Alcohol use: No    Comment: occ   Drug use: Yes    Types: Marijuana    Comment: random, last 2 weeks ago   Sexual activity: Not on file

## 2023-09-21 ENCOUNTER — Other Ambulatory Visit: Payer: Self-pay | Admitting: Orthopedic Surgery

## 2023-09-21 ENCOUNTER — Telehealth: Payer: Self-pay | Admitting: Orthopedic Surgery

## 2023-09-21 NOTE — Telephone Encounter (Signed)
 Letter written per pt's request. Will leave at front desk for him to pick up.

## 2023-09-21 NOTE — Telephone Encounter (Signed)
 Pt informed he will pick up today.

## 2023-09-21 NOTE — Telephone Encounter (Signed)
 Pt called requesting a letter for him to give to disability that he is an amputee and Harden preformed his surgery. Pt also asked to include in letter that he was seen for left leg issues also. Please call pt when ready for pick up. Pt phone number is [phone].

## 2023-10-03 ENCOUNTER — Telehealth: Payer: Self-pay | Admitting: Orthopedic Surgery

## 2023-10-03 NOTE — Telephone Encounter (Signed)
 Pt called stating he left a handicap placard form for Dr Harden. Please call pt when ready for pick up at [phone].

## 2023-10-04 NOTE — Telephone Encounter (Signed)
 Received and is in folder for signature.

## 2023-10-14 ENCOUNTER — Encounter (HOSPITAL_COMMUNITY): Payer: Self-pay | Admitting: *Deleted

## 2023-10-19 ENCOUNTER — Encounter: Payer: Self-pay | Admitting: Internal Medicine

## 2023-10-27 ENCOUNTER — Ambulatory Visit: Admitting: Internal Medicine

## 2023-10-27 ENCOUNTER — Encounter: Payer: Self-pay | Admitting: Internal Medicine

## 2023-10-27 VITALS — BP 120/70 | HR 62 | Temp 97.2°F | Resp 19

## 2023-10-27 DIAGNOSIS — K648 Other hemorrhoids: Secondary | ICD-10-CM | POA: Diagnosis not present

## 2023-10-27 DIAGNOSIS — K635 Polyp of colon: Secondary | ICD-10-CM | POA: Diagnosis not present

## 2023-10-27 DIAGNOSIS — Z1211 Encounter for screening for malignant neoplasm of colon: Secondary | ICD-10-CM

## 2023-10-27 DIAGNOSIS — D120 Benign neoplasm of cecum: Secondary | ICD-10-CM

## 2023-10-27 MED ORDER — SODIUM CHLORIDE 0.9 % IV SOLN: 500.0000 mL | Freq: Once | INTRAVENOUS | Status: DC

## 2023-10-27 NOTE — Patient Instructions (Addendum)
 Resume previous diet Continue present medications--RESUME ELIQUIS AT PRIOR DOSE TODAY, 10-27-23! Await pathology results  Handouts/information given for polyps and hemorrhoids  YOU HAD AN ENDOSCOPIC PROCEDURE TODAY AT THE [facility]:   Refer to the procedure report that was given to you for any specific questions about what was found during the examination.  If the procedure report does not answer your questions, please call your gastroenterologist to clarify.  If you requested that your care partner not be given the details of your procedure findings, then the procedure report has been included in a sealed envelope for you to review at your convenience later.  YOU SHOULD EXPECT: Some feelings of bloating in the abdomen. Passage of more gas than usual.  Walking can help get rid of the air that was put into your GI tract during the procedure and reduce the bloating. If you had a lower endoscopy (such as a colonoscopy or flexible sigmoidoscopy) you may notice spotting of blood in your stool or on the toilet paper. If you underwent a bowel prep for your procedure, you may not have a normal bowel movement for a few days.  Please Note:  You might notice some irritation and congestion in your nose or some drainage.  This is from the oxygen used during your procedure.  There is no need for concern and it should clear up in a day or so.  SYMPTOMS TO REPORT IMMEDIATELY:  Following lower endoscopy (colonoscopy):  Excessive amounts of blood in the stool  Significant tenderness or worsening of abdominal pains  Swelling of the abdomen that is new, acute  Fever of 100F or higher For urgent or emergent issues, a gastroenterologist can be reached at any hour by calling [phone]. Do not use MyChart messaging for urgent concerns.   DIET:  We do recommend a small meal at first, but then you may proceed to your regular diet.  Drink plenty of fluids but you should avoid alcoholic beverages for  24 hours.  ACTIVITY:  You should plan to take it easy for the rest of today and you should NOT DRIVE or use heavy machinery until tomorrow (because of the sedation medicines used during the test).    FOLLOW UP: Our staff will call the number listed on your records the next business day following your procedure.  We will call around 7:15- 8:00 am to check on you and address any questions or concerns that you may have regarding the information given to you following your procedure. If we do not reach you, we will leave a message.     If any biopsies were taken you will be contacted by phone or by letter within the next 1-3 weeks.  Please call us  at [phone] if you have not heard about the biopsies in 3 weeks.   SIGNATURES/CONFIDENTIALITY: You and/or your care partner have signed paperwork which will be entered into your electronic medical record.  These signatures attest to the fact that that the information above on your After Visit Summary has been reviewed and is understood.  Full responsibility of the confidentiality of this discharge information lies with you and/or your care-partner.

## 2023-10-27 NOTE — Progress Notes (Signed)
 GASTROENTEROLOGY PROCEDURE H&P NOTE   Primary Care Physician: Claudene Prentice DELENA Mickey., FNP    Reason for Procedure:   Colon cancer screening  Plan:    colonoscopy  Patient is appropriate for endoscopic procedure(s) in the ambulatory ([facility]) setting.  The nature of the procedure, as well as the risks, benefits, and alternatives were carefully and thoroughly reviewed with the patient. Ample time for discussion and questions allowed. The patient understood, was satisfied, and agreed to proceed.     HPI: Steven Logan is a 53 y.o. male who presents for colonoscopy.  Medical history as below.  Tolerated the prep.  No recent chest pain or shortness of breath.  No abdominal pain today.  Past Medical History:  Diagnosis Date   Anemia    Arthritis    Chronic kidney disease    esrd   Depression    Diabetes mellitus    DVT (deep venous thrombosis) (HCC)    History of DVT   Dyslipidemia    Eczema    Gout    History of Pancreatitis    HTN (hypertension)    Immunosuppression 05/26/2016   Kidney transplant recipient 05/25/2016   Morbid obesity (HCC)    Pulmonary embolism (HCC)    3  in  lungs  at  one time...   Renal hypertension    Hemo started  Sept 2014- MWF    Past Surgical History:  Procedure Laterality Date   AMPUTATION Right 06/12/2013   Procedure: Transtibial Amputation;  Surgeon: Jerona LULLA Sage, MD;  Location: [facility] OR;  Service: Orthopedics;  Laterality: Right;   AMPUTATION Right 06/27/2013   Procedure: AMPUTATION ABOVE KNEE;  Surgeon: Jerona Sage LULLA, MD;  Location: [facility] OR;  Service: Orthopedics;  Laterality: Right;  Right Above Knee Amputation   AV FISTULA PLACEMENT Left 04/07/2012   Procedure: ARTERIOVENOUS (AV) FISTULA CREATION;  Surgeon: Lonni GORMAN Blade, MD;  Location: [facility] OR;  Service: Vascular;  Laterality: Left;   FISTULOGRAM Left 08/07/2012   Procedure: FISTULOGRAM;  Surgeon: Lonni GORMAN Blade, MD;  Location: [facility] CATH LAB;  Service: Cardiovascular;  Laterality:  Left;  arm   I & D EXTREMITY Right 06/02/2013   Procedure: IRRIGATION AND DEBRIDEMENT ARTHROSCOPIC RIGHT ANKLE;  Surgeon: Elspeth JONELLE Her, MD;  Location[facility] OR;  Service: Orthopedics;  Laterality: Right;   I & D EXTREMITY Right 06/04/2013   Procedure: IRRIGATION AND DEBRIDEMENT RIGHT FOOT/ANKLE;  Surgeon: Jerona LULLA Sage, MD;  Location: [facility] OR;  Service: Orthopedics;  Laterality: Right;   I & D EXTREMITY Right 06/06/2013   Procedure: IRRIGATION AND DEBRIDEMENT EXTREMITY;  Surgeon: Jerona LULLA Sage, MD;  Location: [facility] OR;  Service: Orthopedics;  Laterality: Right;   I & D EXTREMITY Right 06/24/2013   Procedure: IRRIGATION AND DEBRIDEMENT and Revsion of TRANSTIBIAL AMPUTATION;  Surgeon: Jerona Sage LULLA, MD;  Location: [facility] OR;  Service: Orthopedics;  Laterality: Right;   INSERTION OF DIALYSIS CATHETER Right 09/19/2012   Procedure: INSERTION OF DIALYSIS CATHETER;  Surgeon: Lonni GORMAN Blade, MD;  Location: [facility] OR;  Service: Vascular;  Laterality: Right;   LIGATION OF COMPETING BRANCHES OF ARTERIOVENOUS FISTULA Left 09/19/2012   Procedure: LIGATION OF COMPETING BRANCHES OF LEFT ARM ARTERIOVENOUS FISTULA; Vein angioplasty;  Surgeon: Lonni GORMAN Blade, MD;  Location: [facility] OR;  Service: Vascular;  Laterality: Left;   none      Prior to Admission medications   Medication Sig Start Date End Date Taking? Authorizing Provider  amLODipine  (NORVASC ) 10 MG tablet Take 10 mg  by mouth daily after breakfast.  04/13/17  Yes [provider]  aspirin 81 MG chewable tablet Chew 1 tablet by mouth daily. 05/28/16  Yes [provider]  calcitRIOL  (ROCALTROL ) 0.25 MCG capsule Take 0.25 mcg by mouth daily. 12/07/18  Yes [provider]  Cyanocobalamin 1000 MCG LOZG Place 1,000 mcg under the tongue. 06/27/23  Yes [provider]  Docusate Sodium  (DSS) 100 MG CAPS Take 100 mg by mouth. As needed 05/25/15  Yes [provider]  fluticasone (FLONASE) 50 MCG/ACT nasal spray Place 2 sprays into both  nostrils daily. 05/07/23  Yes [provider]  JANUVIA  50 MG tablet TAKE 1 TABLET BY MOUTH ONCE DAILY AFTER BREAKFAST 03/05/19  Yes Celestia Rosaline SQUIBB, NP  labetalol  (NORMODYNE ) 200 MG tablet Take 100 mg by mouth 2 (two) times daily.    Yes [provider]  montelukast (SINGULAIR) 10 MG tablet Take 10 mg by mouth at bedtime. 09/19/23  Yes [provider]  mycophenolate  (MYFORTIC ) 180 MG EC tablet Take 360 mg by mouth 2 (two) times daily.  05/03/17  Yes [provider]  omeprazole (PRILOSEC) 20 MG capsule Take 20 mg by mouth. As needed 05/28/16  Yes [provider]  predniSONE  (DELTASONE ) 5 MG tablet Take 5 mg by mouth daily. 05/30/17  Yes [provider]  rosuvastatin (CRESTOR) 10 MG tablet Take 10 mg by mouth every morning. 09/19/23  Yes [provider]  sodium bicarbonate  650 MG tablet Take by mouth. 05/18/17  Yes [provider]  tacrolimus  (PROGRAF ) 1 MG capsule Take 2 mg by mouth 2 (two) times daily. 05/03/17  Yes [provider]  ELIQUIS 5 MG TABS tablet TAKE 2 TABLETS BY MOUTH TWICE DAILY FOR 7 DAYS 06/13/18   [provider]  furosemide  (LASIX ) 20 MG tablet  08/30/18   [provider]  LANTUS  SOLOSTAR 100 UNIT/ML Solostar Pen SMARTSIG:15 Unit(s) SUB-Q Every Night    [provider]  Multiple Vitamin (MULTI-VITAMIN) tablet Take 1 tablet by mouth daily.    [provider]  ondansetron  (ZOFRAN -ODT) 4 MG disintegrating tablet Take 1 tablet (4 mg total) by mouth every 8 (eight) hours as needed. 09/26/22   Ruthe Cornet, DO    Current Outpatient Medications  Medication Sig Dispense Refill   amLODipine  (NORVASC ) 10 MG tablet Take 10 mg by mouth daily after breakfast.      aspirin 81 MG chewable tablet Chew 1 tablet by mouth daily.     calcitRIOL  (ROCALTROL ) 0.25 MCG capsule Take 0.25 mcg by mouth daily.     Cyanocobalamin 1000 MCG LOZG Place 1,000 mcg under the tongue.     Docusate Sodium  (DSS)  100 MG CAPS Take 100 mg by mouth. As needed     fluticasone (FLONASE) 50 MCG/ACT nasal spray Place 2 sprays into both nostrils daily.     JANUVIA  50 MG tablet TAKE 1 TABLET BY MOUTH ONCE DAILY AFTER BREAKFAST 90 tablet 1   labetalol  (NORMODYNE ) 200 MG tablet Take 100 mg by mouth 2 (two) times daily.      montelukast (SINGULAIR) 10 MG tablet Take 10 mg by mouth at bedtime.     mycophenolate  (MYFORTIC ) 180 MG EC tablet Take 360 mg by mouth 2 (two) times daily.   5   omeprazole (PRILOSEC) 20 MG capsule Take 20 mg by mouth. As needed     predniSONE  (DELTASONE ) 5 MG tablet Take 5 mg by mouth daily.  3   rosuvastatin (CRESTOR) 10 MG tablet Take  10 mg by mouth every morning.     sodium bicarbonate  650 MG tablet Take by mouth.     tacrolimus  (PROGRAF ) 1 MG capsule Take 2 mg by mouth 2 (two) times daily.  5   ELIQUIS 5 MG TABS tablet TAKE 2 TABLETS BY MOUTH TWICE DAILY FOR 7 DAYS     furosemide  (LASIX ) 20 MG tablet      LANTUS  SOLOSTAR 100 UNIT/ML Solostar Pen SMARTSIG:15 Unit(s) SUB-Q Every Night     Multiple Vitamin (MULTI-VITAMIN) tablet Take 1 tablet by mouth daily.     ondansetron  (ZOFRAN -ODT) 4 MG disintegrating tablet Take 1 tablet (4 mg total) by mouth every 8 (eight) hours as needed. 20 tablet 0   Current Facility-Administered Medications  Medication Dose Route Frequency Provider Last Rate Last Admin   0.9 %  sodium chloride  infusion  500 mL Intravenous Once [place] Kappes, Gordy HERO, MD        Allergies as of 10/27/2023   (No Known Allergies)    Family History  Problem Relation Age of Onset   Stroke Mother    Diabetes Mother    Hypertension Mother    Hypertension Father    Hypertension Brother    Colon cancer Maternal Grandmother 50   Cancer Other    Coronary artery disease Other    Esophageal cancer Neg Hx    Stomach cancer Neg Hx    Rectal cancer Neg Hx     Social History   Socioeconomic History   Marital status: Single    Spouse name: Not on file   Number of children: Not on  file   Years of education: Not on file   Highest education level: Not on file  Occupational History   Not on file  Tobacco Use   Smoking status: Never   Smokeless tobacco: Never  Vaping Use   Vaping status: Never Used  Substance and Sexual Activity   Alcohol use: No   Drug use: Yes    Types: Marijuana    Comment: random, last 2 weeks ago   Sexual activity: Not on file  Other Topics Concern   Not on file  Social History Narrative   na   Social Drivers of Health   Financial Resource Strain: Not on file  Food Insecurity: Not on file  Transportation Needs: Not on file  Physical Activity: Not on file  Stress: Not on file  Social Connections: Unknown (05/08/2017)   Social Connection and Isolation Panel    Frequency of Communication with Friends and Family: Patient declined    Frequency of Social Gatherings with Friends and Family: Patient declined    Attends Religious Services: Patient declined    Database administrator or Organizations: Patient declined    Attends Banker Meetings: Patient declined    Marital Status: Patient declined  Intimate Partner Violence: Unknown (05/08/2017)   Humiliation, Afraid, Rape, and Kick questionnaire    Fear of Current or Ex-Partner: Patient declined    Emotionally Abused: Patient declined    Physically Abused: Patient declined    Sexually Abused: Patient declined    Physical Exam: Vital signs in last 24 hours: @BP  (!) 140/84   Pulse 66   Temp (!) 97.2 F (36.2 C) (Temporal)   Resp 15   SpO2 100%  GEN: NAD EYE: Sclerae anicteric ENT: MMM CV: Non-tachycardic Pulm: CTA b/l GI: Soft, NT/ND NEURO:  Alert & Oriented x 3   Gordy Starch, MD [facility]  10/27/2023 8:37 AM

## 2023-10-27 NOTE — Op Note (Signed)
 [facility] Patient Name: Steven Logan Procedure Date: 10/27/2023 8:36 AM MRN: [id number] Endoscopist: Gordy CHRISTELLA Starch , MD, [id number] Age: 53 Referring MD:  Date of Birth: July 08, 1970 Gender: Male Account #: [id number] Procedure:                Colonoscopy Indications:              Screening for colorectal malignant neoplasm Medicines:                Monitored Anesthesia Care Procedure:                Pre-Anesthesia Assessment:                           - Prior to the procedure, a History and Physical                            was performed, and patient medications and                            allergies were reviewed. [name] tolerance of                            previous anesthesia was also reviewed. The risks                            and benefits of the procedure and the sedation                            options and risks were discussed with the patient.                            All questions were answered, and informed consent                            was obtained. Prior Anticoagulants: The patient has                            taken Eliquis (apixaban), last dose was 2 days                            prior to procedure. ASA Grade Assessment: III - A                            patient with severe systemic disease. After                            reviewing the risks and benefits, the patient was                            deemed in satisfactory condition to undergo the                            procedure.  After obtaining informed consent, the colonoscope                            was passed under direct vision. Throughout the                            procedure, the patient's blood pressure, pulse, and                            oxygen saturations were monitored continuously. The                            CF [id number] #[id number] was introduced through the anus                            and advanced to the cecum, identified by                             appendiceal orifice and ileocecal valve. The                            colonoscopy was performed without difficulty. The                            patient tolerated the procedure well. The quality                            of the bowel preparation was good. The ileocecal                            valve, appendiceal orifice, and rectum were                            photographed. Scope In: 8:54:16 AM Scope Out: 9:10:34 AM Scope Withdrawal Time: 0 hours 10 minutes 0 seconds  Total Procedure Duration: 0 hours 16 minutes 18 seconds  Findings:                 The digital rectal exam was normal.                           A 4 mm polyp was found in the cecum. The polyp was                            sessile. The polyp was removed with a cold snare.                            Resection and retrieval were complete.                           Internal hemorrhoids were found during                            retroflexion. The hemorrhoids were medium-sized.  The exam was otherwise without abnormality. Complications:            No immediate complications. Estimated Blood Loss:     Estimated blood loss: none. Impression:               - One 4 mm polyp in the cecum, removed with a cold                            snare. Resected and retrieved.                           - Internal hemorrhoids.                           - The examination was otherwise normal. Recommendation:           - Patient has a contact number available for                            emergencies. The signs and symptoms of potential                            delayed complications were discussed with the                            patient. Return to normal activities tomorrow.                            Written discharge instructions were provided to the                            patient.                           - Resume previous diet.                           - Continue  present medications.                           - Resume Eliquis (apixaban) at prior dose today.                            Refer to managing physician for further adjustment                            of therapy.                           - Await pathology results.                           - Repeat colonoscopy is recommended. The                            colonoscopy date will be determined after pathology  results from today's exam become available for                            review. Gordy CHRISTELLA Starch, MD 10/27/2023 9:14:28 AM This report has been signed electronically.

## 2023-10-27 NOTE — Progress Notes (Signed)
 Called to room to assist during endoscopic procedure.  Patient ID and intended procedure confirmed with present staff. Received instructions for my participation in the procedure from the performing physician.

## 2023-10-27 NOTE — Progress Notes (Signed)
 Report to PACU, RN, vss, BBS= Clear.

## 2023-10-28 ENCOUNTER — Telehealth: Payer: Self-pay

## 2023-10-28 NOTE — Telephone Encounter (Signed)
  Follow up Call-     10/27/2023    7:49 AM  Call back number  Post procedure Call Back phone  # [phone]  Permission to leave phone message Yes     Patient questions:  Do you have a fever, pain , or abdominal swelling? No. Pain Score  0 *  Have you tolerated food without any problems? Yes.    Have you been able to return to your normal activities? Yes.    Do you have any questions about your discharge instructions: Diet   No. Medications  No. Follow up visit  No.  Do you have questions or concerns about your Care? No.  Actions: * If pain score is 4 or above: No action needed, pain <4.

## 2023-11-01 ENCOUNTER — Ambulatory Visit: Payer: Self-pay | Admitting: Internal Medicine

## 2023-11-01 LAB — SURGICAL PATHOLOGY

## 2023-11-11 ENCOUNTER — Encounter (HOSPITAL_COMMUNITY): Payer: Self-pay | Admitting: *Deleted

## 2023-11-16 ENCOUNTER — Encounter (HOSPITAL_COMMUNITY): Payer: Self-pay | Admitting: *Deleted

## 2023-11-28 ENCOUNTER — Encounter: Payer: Self-pay | Admitting: Radiology

## 2024-01-26 ENCOUNTER — Encounter (HOSPITAL_COMMUNITY): Payer: Self-pay | Admitting: *Deleted

## 2024-02-02 ENCOUNTER — Ambulatory Visit (INDEPENDENT_AMBULATORY_CARE_PROVIDER_SITE_OTHER): Admitting: Family Medicine

## 2024-02-02 VITALS — BP 136/78 | HR 80 | Wt 245.5 lb

## 2024-02-02 DIAGNOSIS — Z7985 Long-term (current) use of injectable non-insulin antidiabetic drugs: Secondary | ICD-10-CM | POA: Diagnosis not present

## 2024-02-02 DIAGNOSIS — E118 Type 2 diabetes mellitus with unspecified complications: Secondary | ICD-10-CM | POA: Diagnosis not present

## 2024-02-02 LAB — POCT GLYCOSYLATED HEMOGLOBIN (HGB A1C): Hemoglobin A1C: 9.3 % — AB (ref 4.0–5.6)

## 2024-02-02 MED ORDER — TIRZEPATIDE 2.5 MG/0.5ML ~~LOC~~ SOAJ: 2.5000 mg | SUBCUTANEOUS | 1 refills | Status: DC

## 2024-02-02 NOTE — Progress Notes (Signed)
" ° °  Name: Steven Logan   Date of Visit: 02/02/2024   Date of last visit with me: Visit date not found   CHIEF COMPLAINT:  Chief Complaint  Patient presents with   Establish Care    New patient.         HPI:  Discussed the use of AI scribe software for clinical note transcription with the patient, who gave verbal consent to proceed.  History of Present Illness   Steven Logan is a 54 year old male with a history of kidney transplant who presents for diabetes management.  He has a history of kidney transplant from 2018 and is currently on immunosuppressive medications, including Myfortic  and tacrolimus  (Prograf ), both at low doses. The patient reports that only the transplanted kidney is working, and he does not think the other kidney is functioning.  His current hemoglobin A1c is 9.3, which shows improvement. He is taking his medications as prescribed. The patient reports that his transplant doctor said it would be okay for him to take a GLP-1 receptor agonist, such as Ozempic.         OBJECTIVE:       02/02/2024    1:39 PM  Depression screen PHQ 2/9  Decreased Interest 0  Down, Depressed, Hopeless 0  PHQ - 2 Score 0     BP Readings from Last 3 Encounters:  02/02/24 136/78  10/27/23 120/70  08/17/23 126/74    BP 136/78   Pulse 80   Wt 245 lb 8 oz (111.4 kg)   SpO2 97%   BMI 33.30 kg/m    Physical Exam          Physical Exam Constitutional:      Appearance: Normal appearance.  Neurological:     General: No focal deficit present.     Mental Status: He is alert and oriented to person, place, and time. Mental status is at baseline.     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 Complications include renal issues. Initiated GLP-1  receptor agonist therapy for weight loss and glycemic improvement. Mounjaro  selected for satiety signaling. Initial doses may not show effects until third dose. Hypoglycemia risk due to reduced intake. - Started Mounjaro  injection once weekly, lowest dose. - Educated on Mounjaro  mechanism and satiety response. - Instructed to monitor blood glucose for hypoglycemia. - Follow-up in two weeks for tolerance and dosage adjustment.  Kidney transplant status Post kidney transplant 2018. Transplanted kidney primary renal function. Current immunosuppressive regimen. - Continue Myfortic  and tacrolimus .         Reyn Faivre A. Vita MD [facility] "

## 2024-02-16 ENCOUNTER — Encounter: Payer: Self-pay | Admitting: Family Medicine

## 2024-02-16 ENCOUNTER — Ambulatory Visit: Admitting: Family Medicine

## 2024-02-16 VITALS — BP 102/70 | HR 80 | Ht 72.0 in | Wt 246.5 lb

## 2024-02-16 DIAGNOSIS — E1165 Type 2 diabetes mellitus with hyperglycemia: Secondary | ICD-10-CM

## 2024-02-16 DIAGNOSIS — E785 Hyperlipidemia, unspecified: Secondary | ICD-10-CM

## 2024-02-16 DIAGNOSIS — Z89611 Acquired absence of right leg above knee: Secondary | ICD-10-CM | POA: Diagnosis not present

## 2024-02-16 DIAGNOSIS — Z86711 Personal history of pulmonary embolism: Secondary | ICD-10-CM

## 2024-02-16 DIAGNOSIS — I10 Essential (primary) hypertension: Secondary | ICD-10-CM

## 2024-02-16 MED ORDER — TIRZEPATIDE 5 MG/0.5ML ~~LOC~~ SOAJ: 5.0000 mg | SUBCUTANEOUS | 0 refills | Status: AC

## 2024-02-16 NOTE — Progress Notes (Signed)
 Chief Complaint  Patient presents with   Follow-up    Patient was started on Mounjaro  2.5 two weeks ago and is doing well, states Dr.Jha said he would increase if he was doing well. He only has one left as he dropped one and broke it.    Patient presents to follow-up on diabetes.  He established care earlier this month with Dr. Vita. He is s/p kidney transplant and has diabetes.  A1c was 9.3.  He was started on Mounjaro  at his last visit. He has taken 2 shots so far, takes them on Fridays. He has one dose left (unfortunately he had dropped one and it released, as he was about to give the injection). He denies any side effects.  Slight decrease in appetite noted, mainly in the mornings.  He continues on Januvia  and Lantus  15 units. He is concerned about weight gain related to insulin .  Morning sugars are usually around 129, lowest was 117.  Has seen up to 327.   He didn't bring in monitor or list with him today. He admits that his diet isn't great, and he hasn't been getting regular exercise.  On crestor.  No lipids noted in chart since 2023. Eye exam is up to date, scheduled again for June.  HTN--on amlodipine  and labetolol.  He denies headaches, dizziness, chest pain.  BP Readings from Last 3 Encounters:  02/16/24 102/70  02/02/24 136/78  10/27/23 120/70   In reviewing his medications, Eliquis was noted. He reports having had pulmonary emboli. He recalls being told he should probably stay on it.  Unclear what type of evaluation was done (ie if any hypercoaguable workup done). He states this occurred after surgery/immobility.    PMH, PSH, SH reviewed  H/o kidney transplant, PE's, allergies, HTN, HLD, DM and allergies.  Outpatient Encounter Medications as of 02/16/2024  Medication Sig Note   amLODipine  (NORVASC ) 10 MG tablet Take 10 mg by mouth daily after breakfast.     aspirin 81 MG chewable tablet Chew 1 tablet by mouth daily.    calcitRIOL  (ROCALTROL ) 0.25 MCG capsule Take 0.25  mcg by mouth daily.    Docusate Sodium  (DSS) 100 MG CAPS Take 100 mg by mouth. As needed 02/16/2024: As needed   ELIQUIS 5 MG TABS tablet TAKE 2 TABLETS BY MOUTH TWICE DAILY FOR 7 DAYS    fluticasone (FLONASE) 50 MCG/ACT nasal spray Place 2 sprays into both nostrils daily. 02/16/2024: As needed   furosemide  (LASIX ) 20 MG tablet  02/16/2024: As needed   labetalol  (NORMODYNE ) 200 MG tablet Take 100 mg by mouth 2 (two) times daily.  02/16/2024: Going to check on dose   LANTUS  SOLOSTAR 100 UNIT/ML Solostar Pen SMARTSIG:15 Unit(s) SUB-Q Every Night 02/16/2024: Increase to 17 U on 02/16/24   montelukast (SINGULAIR) 10 MG tablet Take 10 mg by mouth at bedtime.    Multiple Vitamin (MULTI-VITAMIN) tablet Take 1 tablet by mouth daily.    mycophenolate  (MYFORTIC ) 180 MG EC tablet Take 360 mg by mouth 2 (two) times daily.     omeprazole (PRILOSEC) 20 MG capsule Take 20 mg by mouth. As needed    ondansetron  (ZOFRAN -ODT) 4 MG disintegrating tablet Take 1 tablet (4 mg total) by mouth every 8 (eight) hours as needed. 02/16/2024: As needed   rosuvastatin (CRESTOR) 10 MG tablet Take 10 mg by mouth every morning.    sodium bicarbonate  650 MG tablet Take by mouth.    tacrolimus  (PROGRAF ) 1 MG capsule Take 2 mg by mouth 2 (two)  times daily.    tirzepatide  (MOUNJARO ) 5 MG/0.5ML Pen Inject 5 mg into the skin once a week.    [DISCONTINUED] JANUVIA  50 MG tablet TAKE 1 TABLET BY MOUTH ONCE DAILY AFTER BREAKFAST 02/16/2024: on Mounjaro  now   [DISCONTINUED] tirzepatide  (MOUNJARO ) 2.5 MG/0.5ML Pen Inject 2.5 mg into the skin once a week. 02/16/2024: Takes on Friday   [DISCONTINUED] Cyanocobalamin 1000 MCG LOZG Place 1,000 mcg under the tongue.    No facility-administered encounter medications on file as of 02/16/2024.   No Known Allergies  ROS: No f/c, no n/v/d or constipation. No URI symptoms. No HA, dizziness, CP, SOB No skin concerns. Moods are good.    PHYSICAL EXAM:  BP 102/70   Pulse 80   Ht 6' (1.829 m)   Wt  246 lb 8 oz (111.8 kg)   BMI 33.43 kg/m   Wt Readings from Last 3 Encounters:  02/16/24 246 lb 8 oz (111.8 kg)  02/02/24 245 lb 8 oz (111.4 kg)  08/17/23 232 lb (105.2 kg)   Pleasant, well-appearing male in no distress HEENT: conjunctiva and sclera are clear, EOMI Heart: regular rate and rhythm Lungs: clear bilaterally Abdomen: obese, nontender Extremities:  prosthesis on right (above knee amputation), no edema on the left. Psych: normal mood, affect, hygiene and grooming Neuro: alert and oriented, cranial nerves grossly intact.   ASSESSMENT/PLAN:  Poorly controlled type 2 diabetes mellitus (HCC) - increase mounjaro  to 5mg ; stop Januvia . Increase Lantus  to 17U.  Given glu log.  f/u 4 weeks with list of sugars. Healthy diet/exercise recommended - Plan: tirzepatide  (MOUNJARO ) 5 MG/0.5ML Pen  Hypertension, unspecified type - well controlled on current regimen  Hyperlipidemia, unspecified hyperlipidemia type - he is on rosuvastatin.  Hasn't had lipids checked since 2023, nonfasting today. Advised of need for physical, and fasting labs  History of pulmonary embolism - he will check with his other doctor re: work-up and confirm if anticoagulation is needed longterm  History of right above knee amputation (HCC)  Stop januvia --not to be used with GLPs Increase mounjaro  to 5mg , and expect continued titration up. Increase lantus  to 17 for now (since sugars remain high, and we are stopping januvia ).  Discussed goal is to try and decrease insulin  as sugars improve and as he loses weight.    F/u visit 4 weeks Will need CPE also, (fasting, past due for lipids)

## 2024-02-16 NOTE — Patient Instructions (Addendum)
 You should verify whether you truly need to be on eliquis long-term. If you had a normal hypercoagulability work-up (no underlying reason that you might form clots) and your clots were related to surgery or immobility, then you might not need it long-term. Check with your other doctors.  Keep track of your sugars on the log provided (vs another logbook). Having the separate columns for fasting sugars compared to those after eating is helpful for us  to review your sugars. Keep comments of what might have driven your sugars up, or why it might have been good (ie exercise). Notate on this sheet the dose changes to your medications.  Try and eat adequate protein in your diet, and eliminate the unhealthy choices. Try and get some regular exercise, including strength training so that you don't lose muscle mass as you start to lose weight.  We are shooting for morning sugars of <125. We are shooting for sugars of <160 after meals.  Stop the Januvia . Increase your Lantus  to 17 units. Increase the mounjaro  to 5 mg after you finish the 2.5 mg dose. (a week early should be fine, since you lost a dose).

## 2024-03-15 ENCOUNTER — Encounter: Admitting: Family Medicine
